# Patient Record
Sex: Female | Born: 1970 | Race: White | Hispanic: No | Marital: Married | Smoking: Never smoker
Health system: Southern US, Community
[De-identification: ages and names within clinical notes are randomized; demographics above are authoritative.]

## PROBLEM LIST (undated history)

## (undated) DIAGNOSIS — B019 Varicella without complication: Secondary | ICD-10-CM

## (undated) DIAGNOSIS — H269 Unspecified cataract: Secondary | ICD-10-CM

## (undated) DIAGNOSIS — F32A Depression, unspecified: Secondary | ICD-10-CM

## (undated) DIAGNOSIS — I499 Cardiac arrhythmia, unspecified: Secondary | ICD-10-CM

## (undated) DIAGNOSIS — D649 Anemia, unspecified: Secondary | ICD-10-CM

## (undated) DIAGNOSIS — I10 Essential (primary) hypertension: Secondary | ICD-10-CM

## (undated) DIAGNOSIS — T4145XA Adverse effect of unspecified anesthetic, initial encounter: Secondary | ICD-10-CM

## (undated) DIAGNOSIS — T8859XA Other complications of anesthesia, initial encounter: Secondary | ICD-10-CM

## (undated) DIAGNOSIS — F319 Bipolar disorder, unspecified: Secondary | ICD-10-CM

## (undated) DIAGNOSIS — R0600 Dyspnea, unspecified: Secondary | ICD-10-CM

## (undated) DIAGNOSIS — E039 Hypothyroidism, unspecified: Secondary | ICD-10-CM

## (undated) DIAGNOSIS — F419 Anxiety disorder, unspecified: Secondary | ICD-10-CM

## (undated) DIAGNOSIS — K219 Gastro-esophageal reflux disease without esophagitis: Secondary | ICD-10-CM

## (undated) DIAGNOSIS — F329 Major depressive disorder, single episode, unspecified: Secondary | ICD-10-CM

## (undated) DIAGNOSIS — T7840XA Allergy, unspecified, initial encounter: Secondary | ICD-10-CM

## (undated) DIAGNOSIS — E785 Hyperlipidemia, unspecified: Secondary | ICD-10-CM

## (undated) DIAGNOSIS — G4730 Sleep apnea, unspecified: Secondary | ICD-10-CM

## (undated) HISTORY — DX: Hyperlipidemia, unspecified: E78.5

## (undated) HISTORY — PX: WISDOM TOOTH EXTRACTION: SHX21

## (undated) HISTORY — PX: CATARACT EXTRACTION, BILATERAL: SHX1313

## (undated) HISTORY — DX: Cardiac arrhythmia, unspecified: I49.9

## (undated) HISTORY — DX: Gastro-esophageal reflux disease without esophagitis: K21.9

## (undated) HISTORY — DX: Bipolar disorder, unspecified: F31.9

## (undated) HISTORY — DX: Anxiety disorder, unspecified: F41.9

## (undated) HISTORY — DX: Unspecified cataract: H26.9

## (undated) HISTORY — DX: Essential (primary) hypertension: I10

## (undated) HISTORY — DX: Anemia, unspecified: D64.9

## (undated) HISTORY — DX: Major depressive disorder, single episode, unspecified: F32.9

## (undated) HISTORY — DX: Sleep apnea, unspecified: G47.30

## (undated) HISTORY — DX: Varicella without complication: B01.9

## (undated) HISTORY — DX: Depression, unspecified: F32.A

## (undated) HISTORY — DX: Allergy, unspecified, initial encounter: T78.40XA

---
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
## 1997-05-26 HISTORY — PX: BACK SURGERY: SHX140

## 1999-05-27 HISTORY — PX: THYROIDECTOMY, PARTIAL: SHX18

## 1999-11-26 ENCOUNTER — Other Ambulatory Visit: Admission: RE | Admit: 1999-11-26 | Discharge: 1999-11-26 | Payer: Self-pay | Admitting: Endocrinology

## 1999-12-04 ENCOUNTER — Encounter: Payer: Self-pay | Admitting: Endocrinology

## 1999-12-04 ENCOUNTER — Ambulatory Visit (HOSPITAL_COMMUNITY): Admission: RE | Admit: 1999-12-04 | Discharge: 1999-12-04 | Payer: Self-pay | Admitting: Endocrinology

## 2000-02-05 ENCOUNTER — Encounter: Payer: Self-pay | Admitting: Surgery

## 2000-02-10 ENCOUNTER — Observation Stay (HOSPITAL_COMMUNITY): Admission: RE | Admit: 2000-02-10 | Discharge: 2000-02-11 | Payer: Self-pay | Admitting: Surgery

## 2000-02-10 ENCOUNTER — Encounter (INDEPENDENT_AMBULATORY_CARE_PROVIDER_SITE_OTHER): Payer: Self-pay | Admitting: Specialist

## 2000-09-10 ENCOUNTER — Encounter: Payer: Self-pay | Admitting: Emergency Medicine

## 2000-09-10 ENCOUNTER — Encounter: Admission: RE | Admit: 2000-09-10 | Discharge: 2000-09-10 | Payer: Self-pay | Admitting: Emergency Medicine

## 2000-12-28 ENCOUNTER — Encounter: Admission: RE | Admit: 2000-12-28 | Discharge: 2001-03-28 | Payer: Self-pay | Admitting: Emergency Medicine

## 2001-05-26 HISTORY — PX: UMBILICAL HERNIA REPAIR: SHX196

## 2002-02-23 ENCOUNTER — Ambulatory Visit (HOSPITAL_BASED_OUTPATIENT_CLINIC_OR_DEPARTMENT_OTHER): Admission: RE | Admit: 2002-02-23 | Discharge: 2002-02-23 | Payer: Self-pay | Admitting: Surgery

## 2002-02-23 ENCOUNTER — Encounter (INDEPENDENT_AMBULATORY_CARE_PROVIDER_SITE_OTHER): Payer: Self-pay | Admitting: Specialist

## 2003-11-20 ENCOUNTER — Other Ambulatory Visit: Admission: RE | Admit: 2003-11-20 | Discharge: 2003-11-20 | Payer: Self-pay | Admitting: Obstetrics and Gynecology

## 2003-12-04 ENCOUNTER — Encounter: Admission: RE | Admit: 2003-12-04 | Discharge: 2003-12-04 | Payer: Self-pay | Admitting: Obstetrics and Gynecology

## 2004-11-21 ENCOUNTER — Encounter: Admission: RE | Admit: 2004-11-21 | Discharge: 2004-11-21 | Payer: Self-pay | Admitting: Emergency Medicine

## 2006-01-19 ENCOUNTER — Encounter: Admission: RE | Admit: 2006-01-19 | Discharge: 2006-01-19 | Payer: Self-pay | Admitting: Emergency Medicine

## 2006-10-15 ENCOUNTER — Ambulatory Visit: Payer: Self-pay | Admitting: Cardiology

## 2006-10-18 ENCOUNTER — Ambulatory Visit (HOSPITAL_BASED_OUTPATIENT_CLINIC_OR_DEPARTMENT_OTHER): Admission: RE | Admit: 2006-10-18 | Discharge: 2006-10-18 | Payer: Self-pay | Admitting: Emergency Medicine

## 2006-10-18 ENCOUNTER — Encounter: Payer: Self-pay | Admitting: Internal Medicine

## 2006-10-19 ENCOUNTER — Ambulatory Visit: Payer: Self-pay | Admitting: Internal Medicine

## 2006-11-02 ENCOUNTER — Ambulatory Visit: Payer: Self-pay

## 2006-11-02 ENCOUNTER — Encounter: Payer: Self-pay | Admitting: Cardiology

## 2006-11-06 ENCOUNTER — Ambulatory Visit: Payer: Self-pay | Admitting: Cardiovascular Disease

## 2006-11-23 ENCOUNTER — Encounter: Payer: Self-pay | Admitting: Internal Medicine

## 2006-11-23 ENCOUNTER — Ambulatory Visit (HOSPITAL_BASED_OUTPATIENT_CLINIC_OR_DEPARTMENT_OTHER): Admission: RE | Admit: 2006-11-23 | Discharge: 2006-11-23 | Payer: Self-pay | Admitting: Emergency Medicine

## 2006-11-29 ENCOUNTER — Ambulatory Visit: Payer: Self-pay | Admitting: Internal Medicine

## 2006-12-24 ENCOUNTER — Ambulatory Visit: Payer: Self-pay | Admitting: Pulmonary Disease

## 2007-01-27 ENCOUNTER — Ambulatory Visit: Payer: Self-pay | Admitting: Pulmonary Disease

## 2007-04-15 ENCOUNTER — Encounter: Admission: RE | Admit: 2007-04-15 | Discharge: 2007-04-15 | Payer: Self-pay | Admitting: Sports Medicine

## 2007-04-23 ENCOUNTER — Telehealth: Payer: Self-pay | Admitting: Pulmonary Disease

## 2007-05-17 ENCOUNTER — Telehealth (INDEPENDENT_AMBULATORY_CARE_PROVIDER_SITE_OTHER): Payer: Self-pay | Admitting: *Deleted

## 2007-05-17 DIAGNOSIS — G4733 Obstructive sleep apnea (adult) (pediatric): Secondary | ICD-10-CM | POA: Insufficient documentation

## 2007-05-25 ENCOUNTER — Encounter: Payer: Self-pay | Admitting: Pulmonary Disease

## 2007-05-25 DIAGNOSIS — F319 Bipolar disorder, unspecified: Secondary | ICD-10-CM | POA: Insufficient documentation

## 2007-05-25 DIAGNOSIS — G43909 Migraine, unspecified, not intractable, without status migrainosus: Secondary | ICD-10-CM | POA: Insufficient documentation

## 2007-05-25 DIAGNOSIS — E782 Mixed hyperlipidemia: Secondary | ICD-10-CM | POA: Insufficient documentation

## 2007-05-25 DIAGNOSIS — I10 Essential (primary) hypertension: Secondary | ICD-10-CM | POA: Insufficient documentation

## 2007-05-25 DIAGNOSIS — J309 Allergic rhinitis, unspecified: Secondary | ICD-10-CM

## 2007-05-27 HISTORY — PX: LAPAROSCOPIC ASSISTED VAGINAL HYSTERECTOMY: SHX5398

## 2007-06-01 ENCOUNTER — Encounter: Admission: RE | Admit: 2007-06-01 | Discharge: 2007-06-01 | Payer: Self-pay | Admitting: Emergency Medicine

## 2007-06-03 ENCOUNTER — Encounter: Admission: RE | Admit: 2007-06-03 | Discharge: 2007-06-03 | Payer: Self-pay | Admitting: Emergency Medicine

## 2007-06-14 ENCOUNTER — Ambulatory Visit (HOSPITAL_COMMUNITY): Admission: RE | Admit: 2007-06-14 | Discharge: 2007-06-14 | Payer: Self-pay | Admitting: Family Medicine

## 2007-06-23 ENCOUNTER — Ambulatory Visit: Payer: Self-pay | Admitting: Pulmonary Disease

## 2007-06-29 ENCOUNTER — Telehealth (INDEPENDENT_AMBULATORY_CARE_PROVIDER_SITE_OTHER): Payer: Self-pay | Admitting: *Deleted

## 2007-07-01 ENCOUNTER — Encounter: Payer: Self-pay | Admitting: Pulmonary Disease

## 2007-07-01 ENCOUNTER — Ambulatory Visit: Payer: Self-pay | Admitting: Pulmonary Disease

## 2007-07-07 ENCOUNTER — Encounter: Payer: Self-pay | Admitting: Pulmonary Disease

## 2007-07-09 ENCOUNTER — Telehealth: Payer: Self-pay | Admitting: Pulmonary Disease

## 2007-07-20 ENCOUNTER — Ambulatory Visit: Payer: Self-pay | Admitting: Pulmonary Disease

## 2007-08-04 ENCOUNTER — Telehealth: Payer: Self-pay | Admitting: Pulmonary Disease

## 2007-08-12 ENCOUNTER — Telehealth (INDEPENDENT_AMBULATORY_CARE_PROVIDER_SITE_OTHER): Payer: Self-pay | Admitting: *Deleted

## 2007-08-18 ENCOUNTER — Ambulatory Visit: Payer: Self-pay | Admitting: Cardiology

## 2007-09-07 ENCOUNTER — Telehealth: Payer: Self-pay | Admitting: Pulmonary Disease

## 2007-09-15 ENCOUNTER — Ambulatory Visit: Payer: Self-pay | Admitting: Cardiology

## 2007-10-04 ENCOUNTER — Ambulatory Visit: Payer: Self-pay | Admitting: Cardiology

## 2008-07-11 ENCOUNTER — Encounter: Admission: RE | Admit: 2008-07-11 | Discharge: 2008-07-11 | Payer: Self-pay | Admitting: Family Medicine

## 2008-08-22 DIAGNOSIS — K219 Gastro-esophageal reflux disease without esophagitis: Secondary | ICD-10-CM | POA: Insufficient documentation

## 2008-08-22 DIAGNOSIS — R0600 Dyspnea, unspecified: Secondary | ICD-10-CM | POA: Insufficient documentation

## 2008-08-22 DIAGNOSIS — I498 Other specified cardiac arrhythmias: Secondary | ICD-10-CM | POA: Insufficient documentation

## 2008-08-22 DIAGNOSIS — G4730 Sleep apnea, unspecified: Secondary | ICD-10-CM | POA: Insufficient documentation

## 2008-09-07 ENCOUNTER — Encounter: Payer: Self-pay | Admitting: Cardiology

## 2008-09-07 ENCOUNTER — Ambulatory Visit: Payer: Self-pay | Admitting: Cardiology

## 2009-02-16 ENCOUNTER — Ambulatory Visit: Payer: Self-pay | Admitting: Pulmonary Disease

## 2009-03-19 ENCOUNTER — Telehealth: Payer: Self-pay | Admitting: Pulmonary Disease

## 2009-03-19 ENCOUNTER — Encounter: Payer: Self-pay | Admitting: Pulmonary Disease

## 2009-07-18 ENCOUNTER — Encounter (INDEPENDENT_AMBULATORY_CARE_PROVIDER_SITE_OTHER): Payer: Self-pay | Admitting: *Deleted

## 2009-09-08 ENCOUNTER — Encounter: Payer: Self-pay | Admitting: Cardiology

## 2009-09-10 ENCOUNTER — Ambulatory Visit: Payer: Self-pay | Admitting: Cardiology

## 2009-11-05 ENCOUNTER — Telehealth: Payer: Self-pay | Admitting: Cardiology

## 2009-11-06 ENCOUNTER — Telehealth: Payer: Self-pay | Admitting: Cardiology

## 2010-06-25 NOTE — Assessment & Plan Note (Signed)
Summary: f1y  Medications Added GABAPENTIN 600 MG TABS (GABAPENTIN) three times a day LIPITOR 40 MG TABS (ATORVASTATIN CALCIUM) Take 1 tablet by mouth once a day -- out of for 2 weeks VOLTAREN 1 % GEL (DICLOFENAC SODIUM) as directed  ( NOT STARTED YET)      Allergies Added: NKDA  Visit Type:  Follow-up Primary Provider:  Nolon Nations, MD  CC:  tachycardia.  History of Present Illness: The patient is seen for followup of tachycardia.  Historically she had sinus tachycardia that responded well to low-dose beta-blockade.  She does have sleep apnea.  There is also question of an asthmatic component.  Earlier this year she did have some shortness of breath but this has improved.  Current Medications (verified): 1)  Gabitril 4 Mg  Tabs (Tiagabine Hcl) .... 2 Two Times A Day 2)  Hydrochlorothiazide 25 Mg  Tabs (Hydrochlorothiazide) .... Take 1 Tablet By Mouth Once A Day 3)  Lisinopril 20 Mg  Tabs (Lisinopril) .... Once Daily 4)  Glucophage 1000 Mg  Tabs (Metformin Hcl) .... Two Times A Day 5)  Amaryl 2mg   Tabs (Glimepiride) .... Take 1 Tablet By Mouth Once A Day 6)  Lamictal 200 Mg  Tabs (Lamotrigine) .... Take 1 Tablet By Mouth Two Times A Day 7)  Zegerid 40-1100 Mg  Caps (Omeprazole-Sodium Bicarbonate) .... Once Daily 8)  Albuterol 90 Mcg/act  Aers (Albuterol) .... Inhale 2 Puffs Every 4 To 6 Hours As Needed 9)  Qvar 80 Mcg/act  Aers (Beclomethasone Dipropionate) .... Inhale 2 Puffs Two Times A Day 10)  Tegretol 200 Mg  Tabs (Carbamazepine) .... Take 1 Tab By Mouth Each Morning and 2 Tabs By Mouth At Bedtime 11)  Lantus Solostar 100 Unit/ml  Soln (Insulin Glargine) .... Use As Directed 12)  Gabapentin 600 Mg Tabs (Gabapentin) .... Three Times A Day 13)  Estradiol 1.5 Mg Tabs (Estradiol) .[name].. 1 Once Daily 14)  Cinnamon 500 Mg Caps (Cinnamon) .[name].. 1 Two Times A Day 15)  Centrum  Tabs (Multiple Vitamins-Minerals) .... Take 1 Tablet By Mouth Once A Day 16)  Caltrate 600+d 600-400 Mg-Unit  Tabs (Calcium Carbonate-Vitamin D) .... Take 1 Tablet By Mouth Two Times A Day 17)  Vitamin D 2000 Unit Tabs (Cholecalciferol) .... Take 1 Tablet By Mouth Once A Day 18)  Aspirin 81 Mg Tabs (Aspirin) .... Take 2 Tabs By Mouth At Bedtime 19)  Onglyza 5 Mg Tabs (Saxagliptin Hcl) .... Take By Mouth At Lunch 20)  Coreg Cr 40 Mg Xr24h-Cap (Carvedilol Phosphate) .... Take 1 Tablet By Mouth Once A Day 21)  Lipitor 40 Mg Tabs (Atorvastatin Calcium) .... Take 1 Tablet By Mouth Once A Day -- Out of For 2 Weeks 22)  Voltaren 1 % Gel (Diclofenac Sodium) .... As Directed  ( Not Started Yet)  Allergies (verified): No Known Drug Allergies  Past History:  Past Medical History: Last updated: 09/08/2009  GERD (ICD-530.81) SINUS TACHYCARDIA (ICD-427.89)..persistent. Treated with beta blockers EF  60%...echo.Marland KitchenMarland Kitchen6/2008 SHORTNESS OF BREATH (ICD-786.05) DEPRESSION (ICD-311) OBESITY, MORBID (ICD-278.01) ALLERGIC RHINITIS (ICD-477.9) MIGRAINE, CHRONIC (ICD-346.90) HYPERLIPIDEMIA, MIXED (ICD-272.2) IDDM (ICD-250.01) HYPERTENSION (ICD-401.9) OBSTRUCTIVE SLEEP APNEA (ICD-327.23) Diabetes  Review of Systems       Patient denies fever, chills, headache, sweats, rash, change in vision, change in hearing, chest pain, cough, nausea vomiting, urinary symptoms.  All the systems are reviewed and are negative  Vital Signs:  Patient profile:   40 year old female Height:      69 inches Weight:  364 pounds BMI:     53.95 Pulse rate:   83 / minute BP sitting:   112 / 80  (left arm) Cuff size:   large  Vitals Entered By: Hardin Negus, RMA (September 10, 2009 3:02 PM)  Physical Exam  General:  patient is stable.  She is significantly overweight. Eyes:  no xanthelasma. Neck:  no jugular venous distention. Lungs:  lungs are clear.  Respiratory effort is nonlabored. Heart:  cardiac exam reveals S1 and S2.  No clicks or significant murmurs. Abdomen:  abdomen is soft but obese. Extremities:  no peripheral  edema. Psych:  patient is oriented to person time and place.  Affect is normal.   Impression & Recommendations:  Problem # 1:  SINUS TACHYCARDIA (ICD-427.89)  Her updated medication list for this problem includes:    Lisinopril 20 Mg Tabs (Lisinopril) ..... Once daily    Aspirin 81 Mg Tabs (Aspirin) .[name]... Take 2 tabs by mouth at bedtime    Coreg Cr 40 Mg Xr24h-cap (Carvedilol phosphate) .[name]... Take 1 tablet by mouth once a day  Orders: EKG w/ Interpretation (93000) Historically the patient has had some sinus tachycardia.  Her heart rate is well-controlled with carvedilol.  EKG is done today and reviewed by me.  She has normal sinus rhythm with a normal EKG. No further workup is needed.  Problem # 2:  SHORTNESS OF BREATH (ICD-786.05)  Her updated medication list for this problem includes:    Hydrochlorothiazide 25 Mg Tabs (Hydrochlorothiazide) .[name]... Take 1 tablet by mouth once a day    Lisinopril 20 Mg Tabs (Lisinopril) ..... Once daily    Aspirin 81 Mg Tabs (Aspirin) .[name]... Take 2 tabs by mouth at bedtime    Coreg Cr 40 Mg Xr24h-cap (Carvedilol phosphate) .[name]... Take 1 tablet by mouth once a day The patient currently is not having any shortness of breath.  No further workup.  We know that she has good LV function by echo in the past.  I've chosen not to repeat an echo at this time.  Problem # 3:  OBESITY, MORBID (ICD-278.01) Weight loss certainly would help.  Patient Instructions: 1)  Follow up in 1 year

## 2010-06-25 NOTE — Letter (Signed)
Summary: Appointment - Reminder 2  [facility], Main Office  [address]   [place], [place] [address]   Phone: [phone]  Fax: [phone]     July 18, 2009 MRN: [id number]   [facility] [address] [place], [place]  [address]   Dear Ms. Reifschneider,  Our records indicate that it is time to schedule a follow-up appointment with Dr. Myrtis Ser. It is very important that we reach you to schedule this appointment. We look forward to participating in your health care needs. Please contact us at the number listed above at your earliest convenience to schedule your appointment.  If you are unable to make an appointment at this time, give [place] a call so we can update our records.     Sincerely,   Migdalia Dk [facility] Scheduling Team

## 2010-06-25 NOTE — Progress Notes (Signed)
Summary: medication question   Phone Note Call from Patient Call back at Work Phone [phone]   Caller: Patient Summary of Call: medication question Initial call taken by: Judie Grieve,  November 05, 2009 1:40 PM  Follow-up for Phone Call        11/05/09--1555--dr Myrtis Ser or heather--received a call from [name], who states she is no longer going to see  a dr Ivory Broad and has switched to dr Barton Fanny at [place] as PCP--dr rankin would like to know if dr Myrtis Ser would consider decreasing coreg dose as dr Luciana Axe feels this is rather high, also could she switch to a short acting tablet as her insurance will not pay for extended release--pt will be out of coreg on wednesday 6/15--so could we call in coreg to [facility]--advised heather not here until wednesday, but would forward message--nt Follow-up by: Ledon Snare, RN,  November 05, 2009 4:03 PM     Appended Document: medication question See the other phone note

## 2010-06-25 NOTE — Miscellaneous (Signed)
  Clinical Lists Changes  Observations: Added new observation of PAST MED HX:  GERD (ICD-530.81) SINUS TACHYCARDIA (ICD-427.89)..persistent. Treated with beta blockers EF  60%...echo.Marland KitchenMarland Kitchen6/2008 SHORTNESS OF BREATH (ICD-786.05) DEPRESSION (ICD-311) OBESITY, MORBID (ICD-278.01) ALLERGIC RHINITIS (ICD-477.9) MIGRAINE, CHRONIC (ICD-346.90) HYPERLIPIDEMIA, MIXED (ICD-272.2) IDDM (ICD-250.01) HYPERTENSION (ICD-401.9) OBSTRUCTIVE SLEEP APNEA (ICD-327.23) Diabetes  (09/08/2009 16:05) Added new observation of PRIMARY MD: Binnie Rail (09/08/2009 16:05)       Past History:  Past Medical History:  GERD (ICD-530.81) SINUS TACHYCARDIA (ICD-427.89)..persistent. Treated with beta blockers EF  60%...echo.Marland KitchenMarland Kitchen6/2008 SHORTNESS OF BREATH (ICD-786.05) DEPRESSION (ICD-311) OBESITY, MORBID (ICD-278.01) ALLERGIC RHINITIS (ICD-477.9) MIGRAINE, CHRONIC (ICD-346.90) HYPERLIPIDEMIA, MIXED (ICD-272.2) IDDM (ICD-250.01) HYPERTENSION (ICD-401.9) OBSTRUCTIVE SLEEP APNEA (ICD-327.23) Diabetes

## 2010-06-25 NOTE — Progress Notes (Signed)
Summary: pt has questions  Medications Added CARVEDILOL 25 MG TABS (CARVEDILOL) Take one tablet by mouth twice a day       Phone Note Call from Patient Call back at Work Phone [phone]   Caller: Patient Reason for Call: Talk to Nurse, Talk to Doctor Summary of Call: pt insurance is not covering the extended release of coreg and she needs to talk to someone about it. [facility] pcp wants to know if they want to reduce the dosage since 40mg  is kinda high to start on Initial call taken by: Omer Jack,  November 06, 2009 1:06 PM  Follow-up for Phone Call        Phone Call Completed PT AWARE WILL FORWARD TO DR Myrtis Ser FOR REVIEW  AWAITNG ANSWER FROM DR Myrtis Ser. INFORMED PT WILL RETURN CALL ONCE RESPONSE IS GIVEN. VERBALZIED UNDERSTANIDNG. Follow-up by: Scherrie Bateman, LPN,  November 06, 2009 1:19 PM  Additional Follow-up for Phone Call Additional follow up Details #1::        OK to use Carvedilol 25 two times a day or metoprolol  succinate 100 mg daily Talitha Givens, MD, [facility]  November 07, 2009 1:01 PM  pt aware new rx for carvedilol 25mg  two times a day [facility] Meredith Staggers, RN  November 08, 2009 10:12 AM     New/Updated Medications: CARVEDILOL 25 MG TABS (CARVEDILOL) Take one tablet by mouth twice a day Prescriptions: CARVEDILOL 25 MG TABS (CARVEDILOL) Take one tablet by mouth twice a day  #60 x 12   Entered by:   Meredith Staggers, RN   Authorized by:   Talitha Givens, MD, [facility]   Signed by:   Meredith Staggers, RN on 11/08/2009   Method used:   Electronically to        [facility] [address] [id number] (retail)       [address]       [place], [place]  [address]       Ph: [phone]       Fax: [phone]   RxID:   [id number]

## 2010-10-08 NOTE — Assessment & Plan Note (Signed)
[facility]                            CARDIOLOGY OFFICE NOTE   NAME:[name], Crystal DUMIRE                        MRN:          [id number]  DATE:08/18/2007                            DOB:          21-Mar-1971    [name] is here for cardiology evaluation.  I had seen her last in May  2008.  She had some shortness of breath at that time.  Ultimately she  saw Dr. Shelle Iron.  CPAP was recommended and was titrated up.  She then had  some type of pulmonary illness in January 2009 and she has been off her  CPAP for a while.  She tells me that it was not thought to be asthma but  that inhalers may help it.  Despite this, she has had increasing doses  of metoprolol and she has tolerated them well, and in fact this has  helped her.  Now that she is eating better, her blood pressure is under  better control.  She has worn a Holter at some time in the past but I do  not have records.  She notes that when she exercise in the gym, her  heart rate increases significantly.  Also she has increased heart rate  when being at home in the afternoon with a rate in the range of 90-100  at rest.  Considering all these issues she says that the higher dose of  metoprolol has definitely helped.   PAST MEDICAL HISTORY:   ALLERGIES:  No known drug allergies.   MEDICATIONS:  1. Gabitril 8 mg b.i.d.  2. Hydrochlorothiazide 25.  3. Lisinopril 20.  4. Glucophage 1000 b.i.d.  5. Zegerid.  6. Glimepiride.  7. Metoprolol 50 t.i.d. (to be increased to 100 mg b.i.d.)  8. Lamictal.  9. Tegretol inhaler.  10.Allegra nasal spray.  11.Lantus insulin.   OTHER MEDICAL PROBLEMS:  See the list below.   REVIEW OF SYSTEMS:  Other than the HPI, review of systems is negative.   PHYSICAL EXAM:  Blood pressure today is 116/78 with a pulse of 64.  Her  weight is 342 pounds.  This is in fact coming down somewhat and she is  trying to lose weight.  The patient is oriented to person, time and place.   Affect is normal.  HEENT:  Reveals no xanthelasma.  She has normal extraocular motion.  There are no carotid bruits.  There is no jugular venous distention.  Lungs are clear.  No wheezing is heard.  Respiratory effort is not  labored.  Cardiac exam reveals S1-S2.  There are no clicks or significant murmurs.  Her abdomen is obese but soft.  She has no significant peripheral edema.   EKG today reveals sinus rhythm.   PROBLEMS:  1. Persistent sinus tachycardia with normal thyroid functions.  We      have seen some of this in the past.  I believe that she will      respond further to higher doses of beta blockade as long as this      does not affect her lungs.  2. Significant  sleep apnea for which she needs to see have and she      will be returning to Dr. Shelle Iron.  3. Good left ventricular function.  4. Hypertension treated.  5. Diabetes treated.  6. Hyperlipidemia treated.  7. Gastroesophageal reflux disease.  8. Morbid obesity.  She is losing some weight and she needs to      continue doing this.   I believe that some of her shortness of breath may still be related to  increased heart rate.  Hopefully, her lungs will tolerate higher doses  of metoprolol.  We will push her up to 100 mg b.i.d.     Luis Abed, MD, [facility]  Electronically Signed    JDK/MedQ  DD: 08/18/2007  DT: 08/18/2007  Job #: [id number]   cc:   Reuben Likes, M.D.  Barbaraann Share, MD,FCCP

## 2010-10-08 NOTE — Procedures (Signed)
[name], [name]                 ACCOUNT NO.:  [id number]   MEDICAL RECORD NO.:  [id number]          PATIENT TYPE:  OUT   LOCATION:  SLEEP CENTER                 FACILITY[facility]  [facility]   PHYSICIAN:  Clinton D. Maple Hudson, MD, FCCP, FACPDATE OF BIRTH:  10-23-71   DATE OF STUDY:  11/23/2006                            NOCTURNAL POLYSOMNOGRAM   REFERRING PHYSICIAN:  Reuben Likes, M.D.   INDICATION FOR STUDY:  Hypersomnia with sleep apnea.   EPWORTH SLEEPINESS SCORE:  20/24, BMI 50.8, weight 356 pounds.   MEDICATIONS:  Home medications are listed and reviewed.   A diagnostic NPSG on Oct 18, 2006, recorded an AHI of 23 per hour.  CPAP  titration is requested.   SLEEP ARCHITECTURE:  Total sleep time 275 minutes with sleep efficiency  77%.  Stage I was 15%, stage II 70%, stages III and IV 2%, REM 13% of  total sleep time.  Sleep latency 11 minutes, REM latency 188 minutes,  awake after sleep onset 64 minutes, arousal index 2.8.  Bedtime  medication included Lantus, Depakote, lamotrigine and temazepam.   RESPIRATORY DATA:  CPAP titration protocol.  CPAP was titrated to 11  CWP, AHI 0 per hour.  An extra small Mirage Quattro mask was used with  heated humidifier.   OXYGEN DATA:  Snoring was prevented by CPAP but saturation held at 94%  on room air.   CARDIAC DATA:  Normal sinus rhythm with rate PVC.   MOVEMENT-PARASOMNIA:  No significant movement disturbance.  Bathroom x1.  The patient brought a small fan used for white noise.   IMPRESSIONS-RECOMMENDATIONS:  1. Short total sleep time despite sedating medications taken at      bedtime.  2. CPAP titration to 11 centimeters of water pressure, apnea-hypopnea      index 0 per hour.  An extra small Mirage Quattro mask was used with      heated humidifier.  3. Diagnostic nocturnal polysomnogram on Oct 18, 2006, had recorded an      apnea-hypopnea index of 23 per hour.      Clinton D. Maple Hudson, MD, FCCP, FACP  Diplomate, Biomedical engineer of  Sleep Medicine  Electronically Signed     CDY/MEDQ  D:  11/29/2006 14:12:49  T:  11/29/2006 16:17:40  Job:  [id number]

## 2010-10-08 NOTE — Assessment & Plan Note (Signed)
[facility]                            CARDIOLOGY OFFICE NOTE   NAME:Molzahn, CORLEEN OTWELL                        MRN:          [id number]  DATE:10/04/2007                            DOB:          30-Apr-1971    Ms. Pryer is seen for follow-up.  See my note of September 15, 2007.  We  have been varying the dosing of her metoprolol in terms of doses and  times, and I believe now we are stable.  She will take a 100 in the  morning and a second 100 at suppertime.  This controls her feeling of  tachycardia and her blood pressure is stable.  I will not make any other  changes at this time.   PAST MEDICAL HISTORY:   ALLERGIES:  NO KNOWN DRUG ALLERGIES.   MEDICATIONS:  See the note of September 15, 2007, with no significant  changes.   OTHER MEDICAL PROBLEMS:  See my note of August 18, 2007.   REVIEW OF SYSTEMS:  She is feeling well and doing well.   PHYSICAL EXAMINATION:  VITAL SIGNS:  Weight is 337 pounds, which is  stable for her.  Blood pressure is 121/73 with a pulse of 72.  GENERAL:  The patient is oriented to person, time and place.  Affect is  normal.  She is significantly overweight.  HEENT:  Reveals no xanthelasma.  She has normal extraocular motion.  NECK:  There are no carotid bruits.  There is no jugulovenous  distention.  LUNGS:  Clear.  Respiratory effort is not labored.  CARDIAC:  Reveals S1-S2.  There are no clicks or significant murmurs.  ABDOMEN:  Obese, but soft.  EXTREMITIES:  She has no peripheral edema.   Problems are listed completely on the note of August 18, 2007.   PROBLEM:  Persistent sinus tachycardia.  This is more stable at this  time.  No change in her meds.  I will see her back in 6 months to  rereview and then possibly once a year.     Luis Abed, MD, [facility]  Electronically Signed    JDK/MedQ  DD: 10/04/2007  DT: 10/04/2007  Job #: [id number]   cc:   Reuben Likes, M.D.

## 2010-10-08 NOTE — Assessment & Plan Note (Signed)
[facility]                             PULMONARY OFFICE NOTE   NAME:[name], [name]                        MRN:          [id number]  DATE:12/24/2006                            DOB:          03/10/1971    HISTORY OF PRESENT ILLNESS:  The patient is a 40 year old female whom I  have been asked to see for obstructive sleep apnea.  The patient  underwent nocturnal polysomnography in May 2008, and had an apnea  hypopnea index of 23 events per hour.  She returned to sleep lab in June  2008 where she had a very short total sleep time, and was titrated to a  final pressure of 11 cm with what appeared to be good control of her  obstructive events.  However, she had very little slow wave sleep, and  REM.  It was unclear whether that would be adequate pressure once she  had a longer total sleep time, and deeper levels of sleep.  Patient  states that she typically goes to between 10 and 12 at night, and gets  up at 8:30 in the morning to start her day.  She is exhausted whenever  she wakes up.  She awakens at least 6 to 7 times a night.  She has been  noted to have loud snoring, but no one has ever mentioned pauses in her  breathing during sleep.  She denies arousals.  Patient works in an  office, and has significant sleep pressure during the day with  occasional dozing.  She will also doze with TV and movies, and does not  some sleep pressure with driving.   PAST MEDICAL HISTORY:  Significant for:  1. Hypertension.  2. History of diabetes.  3. History of dyslipidemia.  4. History of chronic migraines.  5. History of allergic rhinitis.  6. History of spine surgery.  7. History of partial thyroidectomy with recent TSH normal according      to the patient.   CURRENT MEDICATIONS:  Include:  1. Gabitril 4 mg 2 b.i.d.  2. Hydrochlorothiazide of known dose daily.  3. Lisinopril 20 mg daily.  4. Toprol 50 mg daily.  5. Glucophage 1000 b.i.d.  6. Amaryl 8 mg  daily.  7. Depakote 5000 mg daily.  8. Wellbutrin 450 daily.  9. Lamictal 150 daily.  10.Insulin in varying doses.  11.Zegerid 40 mg daily.  12.Valium p.r.n.   PATIENT HAS NO KNOWN DRUG ALLERGIES.   SOCIAL HISTORY:  She has never smoked.  She is married and has children.   FAMILY HISTORY:  Remarkable for mother having asthma and allergies,  otherwise noncontributory in 1st degree relatives.   REVIEW OF SYSTEMS:  As per history of present illness.  Also, see  patient intake form documented in the chart.   PHYSICAL EXAMINATION:  GENERAL:  She is a morbidly obese female in no  acute distress.  Blood pressure is 128/86.  Pulse 86.  Temperature is 98.4.  Weight is  264 pounds.  Her O2 saturation on room air is 96%.  HEENT:  Pupils equal, round, and reactive  to light and accommodation.  Extraocular muscles are intact.  Nares shows mild septal deviation to  the left.  Oropharynx with small opening, and significant tissue  redundancy.  There is mild elongation of soft palate and uvula.  NECK:  Large and difficult to assess for JVD.  There is no obvious  thyromegaly or lymphadenopathy.  CHEST:  Totally clear.  CARDIAC EXAM:  Reveals regular rate and rhythm.  No murmurs, rubs, or  gallops.  ABDOMEN:  Soft and nontender with good bowel sounds.  GENITAL EXAM:  Not done and not indicated.  RECTAL EXAM:  Not done and not indicated.  BREAST EXAM:  Not done and not indicated.  LOWER EXTREMITIES:  With trace edema.  Pulses are intact distally.  NEUROLOGIC:  Alert and oriented with no obvious observable motor  defects.   IMPRESSION:  Moderate obstructive sleep apnea documented by nocturnal  polysomnography.  The patient has had a recent titration to a final  pressure of 11 cm, however, she really did not achieve deep sleep, and  it is unclear whether it is a truly therapeutic value for her.  I have  had a long discussion with her about the effects of sleep apnea,  including the short term  quality of life issues and the longterm  cardiovascular issues.  Given her various medical problems, I think it  is essential that we control this, and also help her in terms of her  quality of life.   PLAN:  1. We will initiate CPAP starting at 10 cm.  If she tolerates this      well, I think we should probably do a home auto titration to try      and verify whether the 11 cm is really a good pressure for her.  2. Work on weight loss.  3. The patient will follow up in 4 weeks, sooner if there are      problems.     Barbaraann Share, MD,FCCP  Electronically Signed    [facility]/MedQ  DD: 01/27/2007  DT: 01/27/2007  Job #: [id number]   cc:   Reuben Likes, M.D.

## 2010-10-08 NOTE — Assessment & Plan Note (Signed)
[facility]                             PULMONARY OFFICE NOTE   NAME:[name], Crystal ROUTSON                        MRN:          [id number]  DATE:01/27/2007                            DOB:          15-Jul-1970    SUBJECTIVE:  Ms. Franko comes in today after being started on CPAP at the  last visit.  She has been using this every night and has definitely seen  a difference in terms of her sleep efficiency, decreased number of  awakenings and increased daytime alertness.  She still feels fatigued at  times in the afternoon but I have reminded her that I do not think we  have totally optimized her pressure.  She is having a little bit of  difficulty with the mask slipping but she is working on the interface  between her and the mask with regards to oils on her face and keeping  the mask cleaner.  Overall she is quite pleased with the first 4 weeks  of therapy.   PHYSICAL EXAMINATION:  GENERAL:  She is a morbidly obese female, in no  acute distress.  Blood pressure is 138/86, pulse is 81, temperature is  98.1, weight is 368 pounds, O2 saturation room air is 95%.  There is no  evidence of skin breakdown or partial necrosis from the CPAP mask.   IMPRESSION:  Moderate obstructive sleep apnea which has responded quite  well to continuous positive airway pressure therapy.  Patient has  tolerated the pressure without difficulty and is only having minimal  mask issues.  At this point in time I think we need to optimize the  pressure for her with an auto-titrate study at home.  Perhaps she will  have deeper and more consistent sleep that will give [place] a better idea as  to her true pressure needs.   PLAN:  1. We will get her an auto-titrate device for the next 2 weeks to use      in the place of her own machine and will adjust her pressure      according to the download.  2. Work on weight loss.  3. The patient will continue to work with her mask but will let me  know if she continues to have issues with leaking.  4. If the patient is doing well after pressure optimization I will see      her in 6 months or sooner if she is having problems.     Barbaraann Share, MD,FCCP  Electronically Signed    KMC/MedQ  DD: 01/27/2007  DT: 01/27/2007  Job #: [id number]   cc:   Reuben Likes, M.D.

## 2010-10-08 NOTE — Procedures (Signed)
[name], [name]                 ACCOUNT NO.:  [id number]   MEDICAL RECORD NO.:  [id number]         PATIENT TYPE:  OUT   LOCATION:  SLEEP CENTER                 FACILITY:  [facility]   PHYSICIAN:  Clinton D. Maple Hudson, MD, FCCP, FACPDATE OF BIRTH:   DATE OF STUDY:  10/18/2006                            NOCTURNAL POLYSOMNOGRAM   REFERRING PHYSICIAN:  Reuben Likes, M.D.   INDICATION FOR STUDY:  Hypersomnia with sleep apnea.   EPWORTH SLEEPINESS SCORE:  20/24   BMI 50.8, weight 356 pounds   MEDICATIONS:  Home medications listed and reviewed.   SLEEP ARCHITECTURE:  Short total sleep time 120 minutes with sleep  efficiency 32%.  Stage 1 was 13%, stage 2 was 83%, stages 3 and 4  absent.  REM 5% of total sleep time.  Sleep latency 45 minutes.  REM  latency 79 minutes, awake after sleep onset 12.5 minutes.  Patient woke  at about 1:45 a.m. and was unable to regain sleep.  REM AHI 0.  Bedtime  medication included 10 tablets of 500 mg Depakote ER, one tablet of  Quasense, six tablets of lamotrigine 25 mg, injection of Lantus.   RESPIRATORY DATA:  Split study protocol.  Apnea hypopnea index (AHI,  RDI) 23 obstructive events per hour, indicating moderate obstructive  sleep apnea, hypopnea syndrome.  All events before CPAP were hypopneas,  totaling 46.  Most events occurred while supine.  REM AHI 0.   CPAP titration was attempted by split protocol.  The patient woke for  placement of CPAP, but never returned to sleep.  She indicated that she  was comfortable with the mask and would definitely want to try it.  An  extra-small Quattro full face mask and small Comfort Gel mask were  apparently both acceptable.   OXYGEN DATA:  Moderate to loud snoring with oxygen desaturation to a  nadir of 87%.  Mean oxygen saturation through the study was 92% before  CPAP was attempted.   CARDIAC DATA:  Normal sinus rhythm.   MOVEMENT-PARASOMNIA:  Occasional limb jerk, insignificant.   IMPRESSIONS-RECOMMENDATIONS:  1. Moderate obstructive sleep apnea/hypopnea syndrome, AHI 23 per      hour.  All events were hypopneas during this interval and most were      associated with supine sleep position.  Moderate snoring with      oxygen desaturation to a nadir of 87%.  2. She met criteria for split protocol CPAP titration, but was unable      to regain sleep after CPAP mask was placed.  She indicated it was      comfortable and was willing to try.  Technician suggests return      with sleep medication if      CPAP titration is still considered appropriate.  3. Note large number of tablets of medication technician indicates      were taken at bedtime.      Clinton D. Maple Hudson, MD, [facility], FACP  Diplomate, Biomedical engineer of Sleep Medicine  Electronically Signed     CDY/MEDQ  D:  10/19/2006 10:22:39  T:  10/19/2006 13:55:22  Job:  [id number]

## 2010-10-08 NOTE — Assessment & Plan Note (Signed)
[name]                            CARDIOLOGY OFFICE NOTE   NAME:[name]                        MRN:          [id number]  DATE:11/06/2006                            DOB:          [birth year]    REFERRING PHYSICIAN:  Reuben Likes, M.D.   HISTORY OF PRESENT ILLNESS:  [name] is a 40 year old female patient  who returns to the office today for followup on shortness of breath.  When Dr. Myrtis Ser saw her last, he put her on 3 days of Lasix, asked her to  decrease her salt and fluid intake, and set her up for an  echocardiogram.  Her EF was 60% without significant valvular  abnormalities.  She returns today for followup.  She notes her breathing  is better.  She still notes some shortness of breath from time to time  with exertion.  She really notes it more when she is sitting still at  rest.  Denies orthopnea or paroxysmal nocturnal dyspnea.  Denies any  syncope.  Denies any chest pain.   CURRENT MEDICATIONS:  1. Gabitril 4 mg 2 tablets b.i.d.  2. Hydrochlorothiazide daily.  3. Lisinopril 20 mg daily.  4. Toprol 50 mg daily.  5. Glucophage 1 gm b.i.d.  6. Amaryl 8 mg a day.  7. Depakote 5000 mg daily.  8. Wellbutrin 300 mg every other day alternating with 450 mg.  9. Lamictal 150 mg a day.  10.Insulin as directed.  11.Valium p.r.n.   ALLERGIES:  NO KNOWN DRUG ALLERGIES.   PHYSICAL EXAMINATION:  She is a well-nourished, well-developed female in  no acute distress.  Blood pressure 130/86.  Pulse 89.  Weight 356 pounds.  HEENT:  Normal.  NECK:  Without JVD.  CARDIAC:  Normal S1 and S2.  Regular rate and rhythm.  LUNGS:  Clear to auscultation bilaterally without wheezing, rhonchi, or  rales.  ABDOMEN:  Soft and non-tender.  EXTREMITIES:  Trace to 1+ edema bilaterally.  Electrocardiogram reveals sinus rhythm with a heart rate of 87.  No  acute changes.   IMPRESSION:  1. Dyspnea.      a.     Probably multifactorial related to  diagnosis of sleep apnea       of morbid obesity.  2. Good left ventricular function with ejection fraction 60%.  3. Hypertension.  4. Diabetes.  5. Hyperlipidemia.  6. Reflux esophagitis.  7. Morbid obesity.   PLAN:  As noted above, the patient's dyspnea is improved.  I had a talk  with her about weight loss and continuing to decrease her fluid and salt  intake.  Dr. Myrtis Ser also saw the patient today.  She can continue followup  with Dr. Lorenz Coaster, and follow up with [place] as needed.      Tereso Newcomer, PA-C  Electronically Signed      Luis Abed, MD, [facility]  Electronically Signed   SW/MedQ  DD: 11/06/2006  DT: 11/07/2006  Job #: [id number]   cc:   Reuben Likes, M.D.

## 2010-10-08 NOTE — Assessment & Plan Note (Signed)
[facility]                            CARDIOLOGY OFFICE NOTE   NAME:Enneking, ZEYNEP FANTROY                        MRN:          [id number]  DATE:09/15/2007                            DOB:          1971/05/09    HISTORY OF PRESENT ILLNESS:  Ms. Wilz is here for follow-up.  I saw her  last on August 18, 2007.  We increased her beta blocker dose to a total  of 200 mg in a day, but changed her to b.i.d. dosing.  We plan to give  her a dose in the morning and the afternoon dose earlier at 3:00 p.m.  and at suppertime.  She is getting good control during the day.  However, she notices some increased pounding sensation of her heart in  the morning.  The rate is not necessarily fast.  The patient also has  had a cough.  She thinks it may be somewhat worse.   PAST MEDICAL HISTORY:   ALLERGIES:  NO KNOWN DRUG ALLERGIES.   MEDICATIONS:  Gabitril, Hydrochlorothiazide, lisinopril, Glucophage,  albuterol inhaler, Zegerid, Lamictal, Tegretol inhaler, fluticasone  nasal spray, Lantus, metoprolol 100 b.i.d. and Zyrtec.   OTHER MEDICAL PROBLEMS:  See the list on my note of August 18, 2007.   REVIEW OF SYSTEMS:  See the HPI.   PHYSICAL EXAMINATION:  VITAL SIGNS:  Weight is 338 pounds.  This is  decreasing from 342 pounds.  Blood pressure is 124/84 with pulse of 62.  GENERAL:  The patient is oriented to person, time and place.  Affect is  normal.  HEENT:  Reveals no xanthelasma.  She has normal extraocular motion.  NECK:  There are no carotid bruits.  There is no jugular venous  distention.  LUNGS:  Clear.  Respiratory effort is not labored.  CARDIAC:  Exam reveals S1-S2.  There are no clicks or significant  murmurs.  ABDOMEN:  Soft.  She has no peripheral edema.   PROBLEMS:  Listed on my note of August 18, 2007.  1. Persistent sinus tachycardia.  We have her on high-dose metoprolol.      This is probably not optimal in view of her lungs or her diabetes.      We can  continue to see if we can find a dose titration that works      for her and keep other issues in mind.  Will change her dose back      to 100 in the morning and her second 100 at suppertime.  This may      help with how she feels in the morning.   See the problem list of August 18, 2007, for the other problems.  I will  see her back 3 weeks.     Luis Abed, MD, [facility]  Electronically Signed    JDK/MedQ  DD: 09/15/2007  DT: 09/15/2007  Job #: [id number]   cc:   Reuben Likes, M.D.  Barbaraann Share, MD,FCCP

## 2010-10-08 NOTE — Assessment & Plan Note (Signed)
[facility]                            CARDIOLOGY OFFICE NOTE   NAME:[name], [name]                        MRN:          [id number]  DATE:10/15/2006                            DOB:          February 13, 1971    CARDIOLOGY CONSULTATION:  [name] is seen for cardiology follow up.  I  had actually seen her in consultation in 2003.  She had an echo that was  technically difficult, but she had normal left ventricular function.  She had some palpitations in the past.  She seemed stable.  More  recently, she has persistent shortness of breath.  Today, she tells me  that it has become progressively worse.  She has 2 types of shortness of  breath.  One occurs with exercise.  The other shortness of breath occurs  when she is sitting still, and this is more bothersome.  She is not  having any definite PND or orthopnea.  She has had some pedal edema.  The patient does not watch her salt intake.  She drinks a large amount  of extra water, feeling that this might help her, and this may be  playing a role.  She does have diabetes and hypertension, and she is  markedly overweight.   PAST MEDICAL HISTORY:   ALLERGIES:  No known drug allergies.   MEDICATIONS:  1. Gabitril 8 mg b.i.d.  2. Hydrochlorothiazide.  3. Lisinopril 20.  4. Toprol 50.  5. Glucophage 100 b.i.d.  6. Amaryl 8.  7. Depakote 5 gm.  8. Wellbutrin 300 every other day alternating with 450.  9. Lamictal 150.  10.Insulin as directed.   OTHER MEDICAL PROBLEMS:  See the list below.   REVIEW OF SYSTEMS:  As of today, the majority of her symptoms are  related to her shortness of breath.  Otherwise, her review of systems is  negative.   PHYSICAL EXAMINATION:  VITAL SIGNS:  The patient's weight today is 356  pounds.  Blood pressure is 150/90 with a pulse of 108.  GENERAL:  The patient is oriented to person, time and place.  Affect is  normal.  She has no xanthelasma.  HEENT:  There is normal  extraocular motion.  She has normal  conjunctivae.  There are no carotid bruits.  There is no jugular venous  distention.  LUNGS:  Clear.  Respiratory effort is not labored.  CARDIAC:  S1 with an S2.  There are no clicks or significant murmurs.  ABDOMEN:  Obese.  EXTREMITIES:  She does have 1+ peripheral edema.  MUSCULOSKELETAL:  There are no major musculoskeletal deformities.   ELECTROCARDIOGRAM:  EKG reveals sinus tachycardia.   LABORATORY DATA:  Labs sent from Dr. Lorenz Coaster reveal that her hemoglobin  was 13.  I cannot read all of the faxed copy.  Her TSH was normal.  BUN  was 15 and creatinine 0.7.   PROBLEMS:  1. Persistent sinus tachycardia with normal thyroid functions.  2. Hypertension.  3. Diabetes.  4. Hyperlipidemia.  5. Reflux esophagitis.  6. Possible sleep apnea.  7. Shortness of breath.  The shortness of  breath appears to be her      major problem.  8. Some volume overload.   I have asked her to watch her salt intake.  I have asked her the  drastically cut down the amount of water that she is drinking.  She will  receive 3 or 4 days of Lasix and then she will return to her  hydrochlorothiazide.  We will obtain a chest x-ray.  She also needs a  follow up echo to reassess left ventricular function.  I will then see  her for follow up.     Luis Abed, MD, [facility]  Electronically Signed    JDK/MedQ  DD: 10/15/2006  DT: 10/15/2006  Job #: [id number]   cc:   Reuben Likes, M.D.

## 2010-10-11 NOTE — Op Note (Signed)
[facility]  Patient:    [name], [name]                          MRN: [id number] Proc. Date: 02/10/00 Adm. Date:  [id number] Attending:  Charlton Haws CC:         Reuben Likes, M.D.  Reather Littler, M.D.   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  OPERATION PERFORMED:  Left thyroid lobectomy and ______.  SURGEON:  Dr. Jamey Ripa.  ASSISTANT:  Dr. Samuella Cota.  ANESTHESIA:  General endotracheal.  CLINICAL HISTORY:  This patient is a 40 year old with an approximately 2.5 cm nodule of the left lobe of the thyroid which had microfollicular pattern on F&A. After discussion of alternatives, risks and complications, the patient elected to proceed to left thyroidectomy with possible ______ thyroidectomy if this proved to be a malignancy.  DESCRIPTION OF PROCEDURE:  The patient was brought to the operating room and after satisfactory general endotracheal anesthesia had been obtained, placed supine on the operating room table with the head extended. The neck was prepped and draped. A curvilinear incision was made 2 fingerbreadths above the clavicular heads and divided through the platysma. Subplatysmal flaps were placed superiorly and inferiorly and self retaining retractor placed. The prethyroid fascia was opened in the midline. The strap muscles were lifted up and dissected off of the nodule and it was retracted medially. It was quite soft and some of the material broke through the capsule and was sent for frozen and this all had follicular looking cells in it.  I freed up the superior pole using clips and 2-0 sutures and trying to take just the vessels leaving no remnant of thyroid behind there. With that done and a little bit of the inferior pole freed up, I divided the middle thyroid vein and rotated  the thyroid medially. I initially didnt locate the recurrent laryngeal nerves began staying within the thyroid capsule and trying to divide the small strands to continue to rotate the thyroid medially and drop the blood vessels laterally until we had the area of the nerve identified and was able to see the nerve. There had been an extra little nodule of thyroid tissue overlying the nerve that had to be dissected off and this was a fairly tedious dissection but we were able to confirm the identity of the nerve and trace it in both directions. Once that had been done, I completed divided small vessels coming to the thyroid and then this had the thyroid completely freed up except for its connection through the isthmus. The isthmus was freed up off of the trachea and divided with clamps. This side was suture ligated with 3-0 Vicryl. The wound was irrigated and appeared to be dry. The nerve appeared to be intact. I saw one completely intact parathyroid which was a little bit off the thyroid and the second was almost in the capsule and had to be dissected off and looked a little ecchymotic but viable.  After a final irrigation, the wound was closed with some 3-0 Vicryl in the midline, 3-0 Vicryl in the platysma, staples and Steri-Strips on the skin.  ______ all follicular cells were seen, although there is some concern there may have been some abnormal cells suggestive of a papillary variant. This could not be clearly diagnosed. Therefore the procedure  was terminated. Sterile dressings applied. The patient tolerated the procedure well. There were no operative complications. All counts were correct. DD:  02/10/00 TD:  02/11/00 Job: [id number]

## 2010-10-11 NOTE — Op Note (Signed)
[name], [name]                           ACCOUNT NO.:  [id number]   MEDICAL RECORD NO.:  [id number]                   PATIENT TYPE:  AMB   LOCATION:  DSC                                  FACILITY:  [facility]   PHYSICIAN:  [name], M.D.           DATE OF BIRTH:  [birth year]   DATE OF PROCEDURE:  02/23/2002  DATE OF DISCHARGE:                                 OPERATIVE REPORT   PREOPERATIVE DIAGNOSIS:  Umbilical hernia.   POSTOPERATIVE DIAGNOSIS:  Umbilical hernia.   PROCEDURE:  Repair of umbilical hernia with mesh.   SURGEON:  [name], M.D.   ANESTHESIA:  General endotracheal.   CLINICAL HISTORY:  This patient is a 40 year old woman with a recently-  diagnosed umbilical hernia, which was a little difficult to actually feel  because of her obesity, and I was not completely sure we could completely  always reduce it.  We elected to proceed to a repair.   DESCRIPTION OF PROCEDURE:  The patient was seen in the holding area and had  no further questions.  The umbilicus was palpated and I could still identify  the hernia.  She was taken into the operating room and after satisfactory  general endotracheal anesthesia had been obtained, the abdomen was prepped  and draped.  I injected some Marcaine around the umbilicus to see if we  could help with postoperative analgesia.  Since she had an innie, I made a  vertical incision starting at the depths of the umbilicus and coming out to  the skin.  By spreading the skin a little bit and putting some self-  retaining retractors, I found the hernia sac, which was attached to the  undersurface of the umbilical skin.  It contained some fluid and omentum.  The sac was excised and the omentum reduced.  I could only identify the  single defect, and it was about 1-1/2 fingerbreadths across.  Once  everything was reduced and I had the fascial edges cleaned up nicely, I put  a piece of Marlex mesh as a plug into the defect,  and it filled the defect  nicely.  It was held in place with a hemostat while I closed the defect with  four sutures of 0 Prolene, incorporating the mesh in each one as we bridged  the defect.  These tied down easily, and there was no tension whatsoever.   Everything appeared to be dry, so I then closed the incision by taking a 3-0  Vicryl to tack the umbilical skin down to the deeper tissues and then closed  the skin with 4-0 Monocryl subcuticular plus Steri-Strips.  The patient  tolerated the procedure well.  There were no operative complications, and  all counts were correct.  [name], M.D.    CJS/MEDQ  D:  02/23/2002  T:  02/24/2002  Job:  [id number]   cc:   Reuben Likes, M.D.   Willa Rough, MD [facility]

## 2010-12-18 ENCOUNTER — Other Ambulatory Visit: Payer: Self-pay | Admitting: Cardiology

## 2011-06-12 ENCOUNTER — Ambulatory Visit: Payer: Self-pay

## 2011-06-23 ENCOUNTER — Encounter: Payer: Self-pay | Admitting: Family Medicine

## 2011-06-23 ENCOUNTER — Ambulatory Visit (INDEPENDENT_AMBULATORY_CARE_PROVIDER_SITE_OTHER): Payer: PRIVATE HEALTH INSURANCE | Admitting: Family Medicine

## 2011-06-23 DIAGNOSIS — F319 Bipolar disorder, unspecified: Secondary | ICD-10-CM

## 2011-06-23 DIAGNOSIS — L853 Xerosis cutis: Secondary | ICD-10-CM

## 2011-06-23 DIAGNOSIS — E782 Mixed hyperlipidemia: Secondary | ICD-10-CM

## 2011-06-23 DIAGNOSIS — R238 Other skin changes: Secondary | ICD-10-CM

## 2011-06-23 DIAGNOSIS — G4733 Obstructive sleep apnea (adult) (pediatric): Secondary | ICD-10-CM

## 2011-06-23 DIAGNOSIS — E109 Type 1 diabetes mellitus without complications: Secondary | ICD-10-CM

## 2011-06-23 DIAGNOSIS — I10 Essential (primary) hypertension: Secondary | ICD-10-CM

## 2011-06-23 NOTE — Progress Notes (Signed)
  Subjective:    Patient ID: [name], female    DOB: August 17, 1970, 41 y.o.   MRN: [id number]  HPI New to establish.  Previous MD- Zachery Dauer at [place].  GYN- Romine.  PsychNolen Mu.  Rushie Goltz.  Cards- Myrtis Ser.  Pulm- Clance.   Last CPE- 'i never had one at [facility]'.  DM- chronic problem, on Metformin, Glimepiride, Lantus.  dx'd in 2000.  Overdue on eye exam.  Checks CBGs TID- reports these have been 'high'.  Just started [employer] 2 weeks ago.  Last A1C was 8.6 in October.  Due for labs.  Denies symptomatic lows, neuropathy, retinopathy.  Hyperlipidemia- chronic problem, on Lipitor 40mg .  Labs last checked in either June or October.  No abd pain, N/V, myalgias.  HTN- chronic problem, on Coreg, Lisinopril, HCTZ.  No CP, SOB, HAs, visual changes, edema.  Sleep Apnea- chronic problem, was seeing Dr Shelle Iron.  On CPAP machine.  Bipolar- chronic problem, following w/ Dr Nolen Mu.  She prescribes the Abilify, Tegretol, Lamictal, Tiagabine.  Mole- L side of upper forehead, enlarging rapidly.  First noticed 'a few months ago'.  DermCraige Cotta  Dry skin- bilateral outer ears, large patch behind R ear.   Review of Systems For ROS see HPI     Objective:   Physical Exam  Vitals reviewed. Constitutional: She is oriented to person, place, and time. She appears well-developed and well-nourished. No distress.       obese  HENT:  Head: Normocephalic and atraumatic.  Eyes: Conjunctivae and EOM are normal. Pupils are equal, round, and reactive to light.  Neck: Normal range of motion. Neck supple. No thyromegaly present.  Cardiovascular: Normal rate, regular rhythm, normal heart sounds and intact distal pulses.   No murmur heard. Pulmonary/Chest: Effort normal and breath sounds normal. No respiratory distress.  Abdominal: Soft. She exhibits no distension. There is no tenderness.  Musculoskeletal: She exhibits no edema.  Lymphadenopathy:    She has no cervical adenopathy.  Neurological: She is  alert and oriented to person, place, and time.  Skin: Skin is warm and dry. Rash (very dry, scaly patches behind ears) noted.  Psychiatric: She has a normal mood and affect. Her behavior is normal.          Assessment & Plan:

## 2011-06-23 NOTE — Patient Instructions (Signed)
Schedule your complete physical in the next 4-6 weeks We'll notify you of your lab results and make any changes if needed Keep up the good work w/ [employer]- you can totally do this!!! Call with any questions or concerns Welcome!  We're glad to have you!!!

## 2011-06-24 LAB — CBC WITH DIFFERENTIAL/PLATELET
Basophils Absolute: 0 10*3/uL (ref 0.0–0.1)
Basophils Relative: 0.6 % (ref 0.0–3.0)
Eosinophils Absolute: 0.6 10*3/uL (ref 0.0–0.7)
HCT: 39.9 % (ref 36.0–46.0)
Hemoglobin: 13.1 g/dL (ref 12.0–15.0)
Lymphs Abs: 3 10*3/uL (ref 0.7–4.0)
MCV: 87 fl (ref 78.0–100.0)
Monocytes Absolute: 0.8 10*3/uL (ref 0.1–1.0)
RBC: 4.59 Mil/uL (ref 3.87–5.11)
RDW: 14.2 % (ref 11.5–14.6)

## 2011-06-24 LAB — HEPATIC FUNCTION PANEL
ALT: 29 U/L (ref 0–35)
Albumin: 3.4 g/dL — ABNORMAL LOW (ref 3.5–5.2)
Alkaline Phosphatase: 76 U/L (ref 39–117)
Bilirubin, Direct: 0 mg/dL (ref 0.0–0.3)
Total Protein: 7.1 g/dL (ref 6.0–8.3)

## 2011-06-24 LAB — BASIC METABOLIC PANEL
Calcium: 9.1 mg/dL (ref 8.4–10.5)
Chloride: 99 mEq/L (ref 96–112)
Potassium: 4.7 mEq/L (ref 3.5–5.1)

## 2011-06-24 LAB — HEMOGLOBIN A1C: Hgb A1c MFr Bld: 9.6 % — ABNORMAL HIGH (ref 4.6–6.5)

## 2011-06-24 LAB — LIPID PANEL
HDL: 66.6 mg/dL (ref >39.00)
Total CHOL/HDL Ratio: 3
VLDL: 31.8 mg/dL (ref 0.0–40.0)

## 2011-06-24 LAB — TSH: TSH: 1.61 u[IU]/mL (ref 0.35–5.50)

## 2011-06-27 ENCOUNTER — Encounter: Payer: Self-pay | Admitting: *Deleted

## 2011-06-27 ENCOUNTER — Encounter: Payer: Self-pay | Admitting: Family Medicine

## 2011-06-27 ENCOUNTER — Telehealth: Payer: Self-pay | Admitting: *Deleted

## 2011-06-27 ENCOUNTER — Ambulatory Visit (INDEPENDENT_AMBULATORY_CARE_PROVIDER_SITE_OTHER): Payer: PRIVATE HEALTH INSURANCE | Admitting: Family Medicine

## 2011-06-27 VITALS — BP 120/75 | HR 85 | Temp 98.5°F | Ht 69.0 in | Wt 367.6 lb

## 2011-06-27 DIAGNOSIS — E109 Type 1 diabetes mellitus without complications: Secondary | ICD-10-CM

## 2011-06-27 MED ORDER — LIRAGLUTIDE 18 MG/3ML ~~LOC~~ SOLN: 1.2000 mg | Freq: Every day | SUBCUTANEOUS | Status: DC

## 2011-06-27 MED ORDER — INSULIN GLARGINE 100 UNIT/ML ~~LOC~~ SOLN: SUBCUTANEOUS | Status: DC

## 2011-06-27 MED ORDER — INSULIN PEN NEEDLE 31G X 5 MM MISC: Status: DC

## 2011-06-27 MED ORDER — INSULIN PEN NEEDLE 32G X 5 MM MISC: 1.0000 [IU] | Freq: Every day | Status: DC

## 2011-06-27 NOTE — Patient Instructions (Addendum)
Follow up as scheduled Start the Victoza daily Start w/ 0.60ml daily x1 week and then increase to 1.19ml daily Call with any questions or concerns You can totally do this!!!

## 2011-06-27 NOTE — Telephone Encounter (Signed)
Pharmacy left vm stating they do not have the 32 g X 5mm available could pt use the 32g X 6mm per the other needles are on back order, advised per MD Tabori that pt could use the 32g X75mm instead of the novatwist, [employer] rep understood and also verifed the correct needles and pen were sent for the lantus as well.pt aware per at pharmacy now

## 2011-07-01 NOTE — Assessment & Plan Note (Signed)
Chronic problem.  Due for labs.  Tolerating statin w/out difficulty.  Check labs.  Adjust meds prn  

## 2011-07-01 NOTE — Assessment & Plan Note (Signed)
Chronic problem.  Well controlled.  Tolerating meds w/out difficulty.  No changes.

## 2011-07-01 NOTE — Assessment & Plan Note (Signed)
Chronic problem.  Recently joined [employer].  Is resuming exercise.  Aware that this is a problem for her and contributing to all her other problems.  Is willing to work hard to correct this.  Will follow closely.

## 2011-07-01 NOTE — Assessment & Plan Note (Signed)
Following w/ Dr Shelle Iron.

## 2011-07-01 NOTE — Assessment & Plan Note (Signed)
Chronic problem.  Overdue on eye exam- encouraged her to schedule.  Currently asymptomatic but pt reports poor CBG control.  Overdue for labs.  Check labs.  Adjust meds prn

## 2011-07-01 NOTE — Assessment & Plan Note (Signed)
New.  Following w/ Dr Nolen Mu.  meds are likely contributing to weight gain.  Will follow along and assist as able.

## 2011-07-04 NOTE — Progress Notes (Signed)
  Subjective:    Patient ID: [name], female    DOB: 11/19/1970, 41 y.o.   MRN: [id number]  HPI DM- based on recent A1C, pt here to start Victoza.  Needs instructions on use.   Review of Systems For ROS see HPI     Objective:   Physical Exam  Vitals reviewed. Constitutional: She appears well-developed and well-nourished. No distress.  Skin: Skin is warm and dry. No rash noted.  Psychiatric: She has a normal mood and affect. Her behavior is normal.          Assessment & Plan:

## 2011-07-04 NOTE — Assessment & Plan Note (Signed)
Based on recent A1C pt needs better glycemic control.  Decision was made to start Victoza to assist w/ weight loss.  Pt was instructed on proper use and gave herself 1st injxn in office.  Total time spent w/ pt 19 min, >50% spent counseling.

## 2011-07-23 ENCOUNTER — Telehealth: Payer: Self-pay | Admitting: Family Medicine

## 2011-07-23 MED ORDER — BECLOMETHASONE DIPROPIONATE 80 MCG/ACT IN AERS: 2.0000 | INHALATION_SPRAY | Freq: Two times a day (BID) | RESPIRATORY_TRACT | Status: DC

## 2011-07-23 NOTE — Telephone Encounter (Signed)
rx sent to pharmacy by e-script  

## 2011-07-23 NOTE — Telephone Encounter (Signed)
Patient states she needs dr Beverely Low to write her rx for Qvar. [facility] [address].

## 2011-08-06 ENCOUNTER — Ambulatory Visit (INDEPENDENT_AMBULATORY_CARE_PROVIDER_SITE_OTHER): Payer: PRIVATE HEALTH INSURANCE | Admitting: Family Medicine

## 2011-08-06 ENCOUNTER — Encounter: Payer: Self-pay | Admitting: Family Medicine

## 2011-08-06 DIAGNOSIS — Z0000 Encounter for general adult medical examination without abnormal findings: Secondary | ICD-10-CM

## 2011-08-06 MED ORDER — OMEPRAZOLE-SODIUM BICARBONATE 40-1100 MG PO CAPS: 1.0000 | ORAL_CAPSULE | Freq: Every day | ORAL | Status: AC

## 2011-08-06 NOTE — Progress Notes (Signed)
  Subjective:    Patient ID: [name], female    DOB: [birth year], 41 y.o.   MRN: [id number]  HPI CPE- UTD on GYN (Rom[facility]).  No concerns.   Review of Systems Patient reports no vision/ hearing changes, adenopathy,fever, weight change,  persistant/recurrent hoarseness , swallowing issues, chest pain, palpitations, edema, persistant/recurrent cough, hemoptysis, dyspnea (rest/exertional/paroxysmal nocturnal), gastrointestinal bleeding (melena, rectal bleeding), abdominal pain, bowel changes, GU symptoms (dysuria, hematuria, incontinence), Gyn symptoms (abnormal  bleeding, pain),  syncope, focal weakness, memory loss, numbness & tingling, skin/hair/nail changes, abnormal bruising or bleeding, anxiety, or depression.   + GERD- on Zegerid OTC w/out relief.  Has previously done Nexium, Zantac, 'everything but prilosec'    Objective:   Physical Exam General Appearance:    Alert, cooperative, no distress, appears stated age, morbidly obese  Head:    Normocephalic, without obvious abnormality, atraumatic  Eyes:    PERRL, conjunctiva/corneas clear, EOM's intact, fundi    benign, both eyes  Ears:    Normal TM's and external ear canals, both ears  Nose:   Nares normal, septum midline, mucosa normal, no drainage    or sinus tenderness  Throat:   Lips, mucosa, and tongue normal; teeth and gums normal  Neck:   Supple, symmetrical, trachea midline, no adenopathy;    Thyroid: no enlargement/tenderness/nodules  Back:     Symmetric, no curvature, ROM normal, no CVA tenderness  Lungs:     Clear to auscultation bilaterally, respirations unlabored  Chest Wall:    No tenderness or deformity   Heart:    Regular rate and rhythm, S1 and S2 normal, no murmur, rub   or gallop  Breast Exam:    Deferred to GYN  Abdomen:     Soft, non-tender, bowel sounds active all four quadrants,    no masses, no organomegaly  Genitalia:    Deferred to GYN  Rectal:    Extremities:   Extremities normal, atraumatic, no cyanosis  or edema  Pulses:   2+ and symmetric all extremities  Skin:   Skin color, texture, turgor normal, no rashes or lesions  Lymph nodes:   Cervical, supraclavicular, and axillary nodes normal  Neurologic:   CNII-XII intact, normal strength, sensation and reflexes    throughout          Assessment & Plan:

## 2011-08-06 NOTE — Patient Instructions (Signed)
Schedule your follow up in May- don't eat before this appt You look good!  Keep it up! Call with any questions or concerns Happy Spring!!!

## 2011-08-06 NOTE — Assessment & Plan Note (Signed)
Pt's PE WNL w/ exception of obesity.  Reviewed labs from previous visit.  UTD on GYN.  Anticipatory guidance provided.

## 2011-08-25 ENCOUNTER — Other Ambulatory Visit: Payer: Self-pay

## 2011-08-25 NOTE — Telephone Encounter (Signed)
msg from patient requesting Lisinopril for a 90 day supply sent to [facility] on [address]. Insurance denied Zegrid and patient wants to get something generic to replace it.   Please advise      KP

## 2011-08-26 ENCOUNTER — Telehealth: Payer: Self-pay | Admitting: Family Medicine

## 2011-08-26 NOTE — Telephone Encounter (Signed)
error 

## 2011-08-26 NOTE — Telephone Encounter (Signed)
Please advise 

## 2011-08-27 ENCOUNTER — Other Ambulatory Visit: Payer: Self-pay | Admitting: *Deleted

## 2011-08-27 MED ORDER — OMEPRAZOLE 40 MG PO CPDR: 40.0000 mg | DELAYED_RELEASE_CAPSULE | Freq: Every day | ORAL | Status: DC

## 2011-08-27 MED ORDER — LISINOPRIL 20 MG PO TABS: 20.0000 mg | ORAL_TABLET | Freq: Every day | ORAL | Status: DC

## 2011-08-27 NOTE — Telephone Encounter (Signed)
Left vm to advise medication change and sent to pharmacy, advised to call office if any questions.

## 2011-08-27 NOTE — Telephone Encounter (Signed)
rx sent to pharmacy by e-script Called pt to advise medication change,

## 2011-08-27 NOTE — Telephone Encounter (Signed)
Ok for Omeprazole 40mg  daily to replace Zegerid Ok for Lisinopril #90, 1 refill

## 2011-08-27 NOTE — Telephone Encounter (Signed)
Noted form received in office for pt prescription request for nexium, pantoprazole, omeprazole, noted MD Tabori filled out form, faxed information to advise pt is to have step two drug Omepra/bicar 40-1100

## 2011-09-08 ENCOUNTER — Other Ambulatory Visit: Payer: Self-pay | Admitting: *Deleted

## 2011-09-08 MED ORDER — CARVEDILOL 25 MG PO TABS: ORAL_TABLET | ORAL | Status: DC

## 2011-09-08 MED ORDER — ATORVASTATIN CALCIUM 40 MG PO TABS: 40.0000 mg | ORAL_TABLET | Freq: Every day | ORAL | Status: DC

## 2011-09-08 NOTE — Telephone Encounter (Signed)
Rx sent 

## 2011-09-15 ENCOUNTER — Other Ambulatory Visit: Payer: Self-pay | Admitting: *Deleted

## 2011-09-15 MED ORDER — METFORMIN HCL 1000 MG PO TABS: 1000.0000 mg | ORAL_TABLET | Freq: Two times a day (BID) | ORAL | Status: DC

## 2011-09-15 NOTE — Telephone Encounter (Signed)
Ok to fill for 90 day supply?

## 2011-09-15 NOTE — Telephone Encounter (Signed)
Rx sent 

## 2011-09-15 NOTE — Telephone Encounter (Signed)
Ok for 90 day supply, 3 refills

## 2011-09-18 ENCOUNTER — Other Ambulatory Visit: Payer: Self-pay | Admitting: *Deleted

## 2011-09-18 MED ORDER — INSULIN GLARGINE 100 UNIT/ML ~~LOC~~ SOLN: SUBCUTANEOUS | Status: DC

## 2011-09-26 ENCOUNTER — Encounter: Payer: PRIVATE HEALTH INSURANCE | Admitting: Family Medicine

## 2011-09-29 ENCOUNTER — Other Ambulatory Visit: Payer: Self-pay | Admitting: *Deleted

## 2011-09-29 MED ORDER — GLIMEPIRIDE 4 MG PO TABS: 4.0000 mg | ORAL_TABLET | Freq: Two times a day (BID) | ORAL | Status: DC

## 2011-09-29 NOTE — Telephone Encounter (Signed)
Rx sent 

## 2011-11-12 ENCOUNTER — Ambulatory Visit (INDEPENDENT_AMBULATORY_CARE_PROVIDER_SITE_OTHER): Payer: PRIVATE HEALTH INSURANCE | Admitting: Family Medicine

## 2011-11-12 ENCOUNTER — Encounter: Payer: Self-pay | Admitting: Family Medicine

## 2011-11-12 VITALS — BP 121/81 | HR 99 | Temp 97.9°F | Ht 69.0 in | Wt 361.4 lb

## 2011-11-12 DIAGNOSIS — I10 Essential (primary) hypertension: Secondary | ICD-10-CM

## 2011-11-12 DIAGNOSIS — H6120 Impacted cerumen, unspecified ear: Secondary | ICD-10-CM

## 2011-11-12 DIAGNOSIS — E109 Type 1 diabetes mellitus without complications: Secondary | ICD-10-CM

## 2011-11-12 DIAGNOSIS — E782 Mixed hyperlipidemia: Secondary | ICD-10-CM

## 2011-11-12 LAB — HEPATIC FUNCTION PANEL
ALT: 23 U/L (ref 0–35)
Alkaline Phosphatase: 84 U/L (ref 39–117)
Bilirubin, Direct: 0 mg/dL (ref 0.0–0.3)
Total Bilirubin: 0.1 mg/dL — ABNORMAL LOW (ref 0.3–1.2)

## 2011-11-12 LAB — BASIC METABOLIC PANEL
CO2: 25 mEq/L (ref 19–32)
Calcium: 8.9 mg/dL (ref 8.4–10.5)
Chloride: 98 mEq/L (ref 96–112)
Potassium: 3.9 mEq/L (ref 3.5–5.1)
Sodium: 136 mEq/L (ref 135–145)

## 2011-11-12 LAB — LIPID PANEL
Cholesterol: 171 mg/dL (ref 0–200)
HDL: 63.6 mg/dL (ref >39.00)
Triglycerides: 225 mg/dL — ABNORMAL HIGH (ref 0.0–149.0)

## 2011-11-12 LAB — LDL CHOLESTEROL, DIRECT: Direct LDL: 90.8 mg/dL

## 2011-11-12 NOTE — Assessment & Plan Note (Signed)
New.  Ears successfully irrigated and TMs normal.

## 2011-11-12 NOTE — Assessment & Plan Note (Signed)
Chronic problem.  Has lost 7 lbs since last visit despite poor eating.  Encouraged her to continue her weight loss efforts.  Will follow.

## 2011-11-12 NOTE — Assessment & Plan Note (Signed)
Chronic problem.  Well controlled on current meds.  Asymptomatic.  No changes. 

## 2011-11-12 NOTE — Assessment & Plan Note (Signed)
Chronic problem.  Pt admits to poor eating during M-I-L's illness and subsequent death.  Fears A1C will be poor.  Stressed importance of resuming healthy diet and regular exercise.  UTD on eye exam.  Will continue to follow closely.

## 2011-11-12 NOTE — Progress Notes (Signed)
  Subjective:    Patient ID: [name], female    DOB: 10-15-1970, 41 y.o.   MRN: [id number]  HPI Hyperlipidemia- chronic problem, on Lipitor.  Denies abd pain, N/V, myalgias  DM- chronic problem, on Lantus, amaryl, Victoza, metformin.  Fears A1C won't be good b/c of recent stress of caring for dying mother-in-law.  Denies symptomatic lows- no shaking, dizziness.  No CP, SOB, HAs, visual changes, edema.  HTN- chronic problem, on Lisinopril, Coreg.  Excellent control.  Asymptomatic.  Obesity- has last 7 lbs since last visit.  Applauded her efforts.  Ears clogged- bilateral, R>L.  Not painful.  + decreased hearing.   Review of Systems For ROS see HPI     Objective:   Physical Exam  Vitals reviewed. Constitutional: She is oriented to person, place, and time. She appears well-developed and well-nourished. No distress.       obese  HENT:  Head: Normocephalic and atraumatic.       TMs obscured by wax bilaterally   Eyes: Conjunctivae and EOM are normal. Pupils are equal, round, and reactive to light.  Neck: Normal range of motion. Neck supple. No thyromegaly present.  Cardiovascular: Normal rate, regular rhythm, normal heart sounds and intact distal pulses.   No murmur heard. Pulmonary/Chest: Effort normal and breath sounds normal. No respiratory distress.  Abdominal: Soft. She exhibits no distension. There is no tenderness.  Musculoskeletal: She exhibits no edema.  Lymphadenopathy:    She has no cervical adenopathy.  Neurological: She is alert and oriented to person, place, and time.  Skin: Skin is warm and dry.  Psychiatric: She has a normal mood and affect. Her behavior is normal.        Assessment & Plan:

## 2011-11-12 NOTE — Patient Instructions (Addendum)
Follow up in 3-4 months to recheck A1C Keep up the good work on healthy diet and regular exercise We'll notify you of your lab results Call with any questions or concerns Happy Birthday!!!

## 2011-11-12 NOTE — Assessment & Plan Note (Signed)
Chronic problem.  Tolerating statin w/out difficulty.  Check labs.  Adjust meds prn  

## 2011-11-13 ENCOUNTER — Encounter: Payer: Self-pay | Admitting: *Deleted

## 2011-12-12 ENCOUNTER — Telehealth: Payer: Self-pay | Admitting: Family Medicine

## 2011-12-12 MED ORDER — ATORVASTATIN CALCIUM 40 MG PO TABS: 40.0000 mg | ORAL_TABLET | Freq: Every day | ORAL | Status: DC

## 2011-12-12 MED ORDER — CARVEDILOL 25 MG PO TABS: ORAL_TABLET | ORAL | Status: DC

## 2011-12-12 NOTE — Telephone Encounter (Signed)
rx sent to pharmacy by e-script  

## 2011-12-12 NOTE — Telephone Encounter (Signed)
Refill: carvedilol 25mg  tablets. Take 1 tablet by mouth twice daily. Qty 180. Last fill 09-09-11

## 2011-12-12 NOTE — Telephone Encounter (Signed)
Refill: Atorvastatin 40mg  tablets. Take 1 tablet by mouth daily. Qty 90. Last fill 09-08-11

## 2011-12-29 ENCOUNTER — Other Ambulatory Visit: Payer: Self-pay | Admitting: Family Medicine

## 2011-12-29 MED ORDER — OMEPRAZOLE 40 MG PO CPDR: 40.0000 mg | DELAYED_RELEASE_CAPSULE | Freq: Every day | ORAL | Status: DC

## 2011-12-29 MED ORDER — GLIMEPIRIDE 4 MG PO TABS: 4.0000 mg | ORAL_TABLET | Freq: Two times a day (BID) | ORAL | Status: DC

## 2011-12-29 NOTE — Telephone Encounter (Signed)
Refill done.  

## 2011-12-29 NOTE — Telephone Encounter (Signed)
Also requesting refill Glimepiride (Tab) 4 MG Take 1 tablet (4 mg total) by mouth 2 (two) times daily.#180 last fill 5.6.13

## 2011-12-29 NOTE — Telephone Encounter (Signed)
Refill Omeprazole 40 MG Take 1 capsule (40 mg total) by mouth daily. #30-last fill 7.6.13 Last ov 6.19.13 annual exam

## 2012-01-08 ENCOUNTER — Other Ambulatory Visit: Payer: Self-pay | Admitting: Family Medicine

## 2012-01-08 MED ORDER — BECLOMETHASONE DIPROPIONATE 80 MCG/ACT IN AERS: 2.0000 | INHALATION_SPRAY | Freq: Two times a day (BID) | RESPIRATORY_TRACT | Status: DC

## 2012-01-08 NOTE — Telephone Encounter (Signed)
rx sent to pharmacy by e-script  

## 2012-01-08 NOTE — Telephone Encounter (Signed)
Refill QVAR 80 MCG Oral Inhaler, #8.700000000000001 inhale 2 puffs into the lungs 2 (two) times daily Last fill 7.6.13 Last ov 6.19.13 V70

## 2012-01-21 ENCOUNTER — Other Ambulatory Visit: Payer: Self-pay | Admitting: Family Medicine

## 2012-01-21 MED ORDER — LISINOPRIL 20 MG PO TABS: 20.0000 mg | ORAL_TABLET | Freq: Every day | ORAL | Status: DC

## 2012-01-21 NOTE — Telephone Encounter (Signed)
refill Lisinopril (Tab) PRINIVIL,ZESTRIL 20 MG Take 1 tablet (20 mg total) by mouth daily #90 last fill 6.26.13 Last ov 6.19.13 f/u DM

## 2012-01-21 NOTE — Telephone Encounter (Signed)
rx sent to pharmacy by e-script  

## 2012-02-16 ENCOUNTER — Ambulatory Visit (INDEPENDENT_AMBULATORY_CARE_PROVIDER_SITE_OTHER): Payer: PRIVATE HEALTH INSURANCE | Admitting: Family

## 2012-02-16 ENCOUNTER — Encounter: Payer: Self-pay | Admitting: Family

## 2012-02-16 VITALS — BP 136/76 | HR 72 | Temp 98.4°F | Resp 16 | Wt 365.0 lb

## 2012-02-16 DIAGNOSIS — J309 Allergic rhinitis, unspecified: Secondary | ICD-10-CM

## 2012-02-16 MED ORDER — BENZONATATE 100 MG PO CAPS: 100.0000 mg | ORAL_CAPSULE | Freq: Three times a day (TID) | ORAL | Status: AC | PRN

## 2012-02-16 MED ORDER — FLUTICASONE PROPIONATE 50 MCG/ACT NA SUSP: 2.0000 | Freq: Every day | NASAL | Status: DC

## 2012-02-16 MED ORDER — AZITHROMYCIN 250 MG PO TABS: ORAL_TABLET | ORAL | Status: DC

## 2012-02-16 NOTE — Progress Notes (Signed)
Subjective:    Patient ID: [name], female    DOB: 1971/04/18, 41 y.o.   MRN: [id number]  HPI  Ms.  [name] is a 41 yr old female who presents today with chief complaint of cough. Reports light wheezing x 1 week.  Thursday started with nasal congestion/sneezing on Thursday.  Sunday AM (9/22) cough started.  Tried robitussin cough gels which helped some. She reports associated chest soreness from cough. Denies associated fever or sick contacts.  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 She has been taking zyrtec   Review of Systems See HPI  Past Medical History  Diagnosis Date  . Asthma     pt stated treated as ashtma but not really asthma  . Chicken pox   . Depression   . Diabetes mellitus   . GERD (gastroesophageal reflux disease)   . Allergy   . Hypertension     readings  . Hyperlipidemia   . Migraine   . UTI (urinary tract infection)   . Irregular heartbeat   . Sleep apnea     History   Social History  . Marital Status: Married    Spouse Name: N/A    Number of Children: N/A  . Years of Education: N/A   Occupational History  . Not on file.   Social History Main Topics  . Smoking status: Never Smoker   . Smokeless tobacco: Not on file  . Alcohol Use: No  . Drug Use: No  . Sexually Active: Not on file   Other Topics Concern  . Not on file   Social History Narrative  . No narrative on file    Past Surgical History  Procedure Date  . Cesarean section     1995  . Thyroidectomy, partial 2001    removed left  . Abdominal hysterectomy     20 09  . Back surgery 1999  . Navel repair 2003  . Ankle surgery 1989    left    Family History  Problem Relation Age of Onset  . Hyperlipidemia Mother   . Mental illness Mother   . Diabetes Mother   . Heart disease Mother   . Early death Mother   . Cancer Father   . Hypertension Father   . Stroke Maternal Aunt   . Stroke Paternal Grandfather   . Mental illness Paternal Grandfather     No  Known Allergies  Current Outpatient Prescriptions on File Prior to Visit  Medication Sig Dispense Refill  . albuterol (PROVENTIL HFA;VENTOLIN HFA) 108 (90 BASE) MCG/ACT inhaler Inhale 2 puffs into the lungs every 4 (four) hours as needed.      . ARIPiprazole (ABILIFY) 5 MG tablet Take 5 mg by mouth daily.      [name] aspirin 81 MG tablet Take 160 mg by mouth daily.      [name] atorvastatin (LIPITOR) 40 MG tablet Take 1 tablet (40 mg total) by mouth daily.  90 tablet  1  . beclomethasone (QVAR) 80 MCG/ACT inhaler Inhale 2 puffs into the lungs 2 (two) times daily.  1 Inhaler  3  . Calcium Carbonate-Vitamin D (CALTRATE 600+D PO) Take 1 tablet by mouth 2 (two) times daily.      . carbamazepine (TEGRETOL XR) 200 MG 12 hr tablet Take 200 mg by mouth 2 (two) times daily.      . carvedilol (COREG) 25 MG tablet TAKE 1 TABLET BY MOUTH TWICE DAILY  180 tablet  1  . diflorasone-emollient (  APEXICON E) 0.05 % CREA Apply 1 application topically 2 (two) times daily.      [name] estradiol (ESTRACE) 1 MG tablet Take 1 mg by mouth 2 (two) times daily.      [name] gabapentin (NEURONTIN) 600 MG tablet Take 600 mg by mouth 3 (three) times daily.      [name] glimepiride (AMARYL) 4 MG tablet Take 1 tablet (4 mg total) by mouth 2 (two) times daily.  180 tablet  0  . glucose blood test strip 1 each by Other route as needed. Use as instructed      . insulin glargine (LANTUS) 100 UNIT/ML injection PEN FOR THIS PT, INJECT QHS  3 mL  3  . Insulin Pen Needle (NOVOTWIST) 32G X 5 MM MISC 1 Units by Does not apply route daily.  100 each  3  . Insulin Pen Needle 31G X 5 MM MISC USE AS DIRECTED  100 each  3  . lamoTRIgine (LAMICTAL) 200 MG tablet Take 200 mg by mouth 2 (two) times daily.      . Liraglutide (VICTOZA) 18 MG/3ML SOLN Inject 0.2 mLs (1.2 mg total) into the skin daily.  6 mL  6  . lisinopril (PRINIVIL,ZESTRIL) 20 MG tablet Take 1 tablet (20 mg total) by mouth daily.  90 tablet  0  . metFORMIN (GLUCOPHAGE) 1000 MG tablet Take 1 tablet  (1,000 mg total) by mouth 2 (two) times daily with a meal.  180 tablet  3  . mometasone (ELOCON) 0.1 % cream Apply 1 application topically as needed.      [name] omeprazole (PRILOSEC) 40 MG capsule Take 1 capsule (40 mg total) by mouth daily.  30 capsule  3  . tiaGABine (GABITRIL) 4 MG tablet Take 8 mg by mouth 2 (two) times daily.      . cholecalciferol (VITAMIN D) 1000 UNITS tablet Take 2,000 Units by mouth daily.      . fluticasone (FLONASE) 50 MCG/ACT nasal spray Place 2 sprays into the nose daily.  16 g  2    BP 136/76  Pulse 72  Temp 98.4 F (36.9 C) (Oral)  Resp 16  Wt 365 lb (165.563 kg)  SpO2 96%       Objective:   Physical Exam  Constitutional: She is oriented to person, place, and time. She appears well-developed and well-nourished. No distress.  HENT:  Head: Normocephalic and atraumatic.  Right Ear: Tympanic membrane and ear canal normal.  Left Ear: Tympanic membrane and ear canal normal.  Mouth/Throat: No oropharyngeal exudate or posterior oropharyngeal edema.  Cardiovascular: Normal rate and regular rhythm.   No murmur heard. Pulmonary/Chest: Effort normal and breath sounds normal. No respiratory distress. She has no wheezes. She has no rales. She exhibits no tenderness.  Musculoskeletal: She exhibits no edema.  Neurological: She is alert and oriented to person, place, and time.  Skin: Skin is warm and dry. No rash noted. No erythema. No pallor.  Psychiatric: She has a normal mood and affect. Her behavior is normal. Judgment and thought content normal.          Assessment & Plan:

## 2012-02-16 NOTE — Patient Instructions (Addendum)
Continue Zyrtec, add flonase. If symptoms worsen, or if cough is not improved in 2-3 days, please start zithromax.

## 2012-02-16 NOTE — Assessment & Plan Note (Signed)
Deteriorated, likely due to rag weed.  I think post nasal drip is contributing to her cough. Continue albuterol and Qvar.  I have recommended that she start flonase in addition to her zyrtec.  Add tessalon for cough. If symptoms worsen, or if no improvement in 2-3 days, I have instructed her to start z-pak.

## 2012-02-18 ENCOUNTER — Other Ambulatory Visit: Payer: Self-pay | Admitting: Family Medicine

## 2012-02-18 MED ORDER — INSULIN PEN NEEDLE 32G X 5 MM MISC: 1.0000 [IU] | Freq: Every day | Status: DC

## 2012-02-18 MED ORDER — INSULIN GLARGINE 100 UNIT/ML ~~LOC~~ SOLN: SUBCUTANEOUS | Status: DC

## 2012-02-18 NOTE — Telephone Encounter (Signed)
rx sent to pharmacy by e-script  

## 2012-02-18 NOTE — Telephone Encounter (Signed)
refill lantus solostar pen inj 5 x 3ml #15 inject 60units subccutaneously every night at bedtime last fill 8.14.13- last ov 6.19.13 25-minute ov

## 2012-02-23 ENCOUNTER — Other Ambulatory Visit: Payer: Self-pay | Admitting: Family Medicine

## 2012-02-23 MED ORDER — GLIMEPIRIDE 4 MG PO TABS: 4.0000 mg | ORAL_TABLET | Freq: Two times a day (BID) | ORAL | Status: DC

## 2012-02-23 NOTE — Telephone Encounter (Signed)
refill Glimipiride 4mg  tablets #180, TK 1 T PO BID last fill 8.7.13-last ov 6.19.13

## 2012-02-23 NOTE — Telephone Encounter (Signed)
rx sent to pharmacy by e-script to last until upcoming OV

## 2012-03-12 ENCOUNTER — Ambulatory Visit (INDEPENDENT_AMBULATORY_CARE_PROVIDER_SITE_OTHER): Payer: PRIVATE HEALTH INSURANCE | Admitting: Family Medicine

## 2012-03-12 ENCOUNTER — Ambulatory Visit: Payer: PRIVATE HEALTH INSURANCE | Admitting: Family Medicine

## 2012-03-12 ENCOUNTER — Encounter: Payer: Self-pay | Admitting: Family Medicine

## 2012-03-12 VITALS — BP 139/82 | HR 104 | Temp 98.0°F | Ht 69.25 in | Wt 364.6 lb

## 2012-03-12 DIAGNOSIS — H9209 Otalgia, unspecified ear: Secondary | ICD-10-CM

## 2012-03-12 MED ORDER — HYDROCODONE-ACETAMINOPHEN 5-500 MG PO TABS: 1.0000 | ORAL_TABLET | Freq: Three times a day (TID) | ORAL | Status: DC | PRN

## 2012-03-12 NOTE — Progress Notes (Signed)
  Subjective:    Patient ID: [name], female    DOB: 06/20/1970, 41 y.o.   MRN: [id number]  HPI Ear pain- R ear pain (pt in tears).  sxs started 8 days ago.  Went to UC on Sunday and was started on Cipro orally and Ciprodex drops.  Hx of severe ear infxns.  Yesterday used ear drops and 'it stopped my ear up and i've been in severe pain ever since'.  No drainage from ear.  No fevers.  Taking 800mg  ibuprofen w/out relief, tylenol w/out relief.   Review of Systems For ROS see HPI     Objective:   Physical Exam  Vitals reviewed. Constitutional: She appears well-developed and well-nourished. She appears distressed (tearful due to pain).       Morbidly obese  HENT:  Head: Normocephalic and atraumatic.       L TM mildly retracted but otherwise normal R TM obscured by cerumen/debris- no pain w/ manipulation of pinna, pain w/ palpation of Tragus No TTP over mastoid  Neck: Normal range of motion. Neck supple.  Lymphadenopathy:    She has cervical adenopathy.          Assessment & Plan:

## 2012-03-12 NOTE — Patient Instructions (Addendum)
We're going to send you to ENT Take the Vicodin as needed for pain Hang in there!!

## 2012-03-12 NOTE — Assessment & Plan Note (Signed)
New.  Severe pain.  Pt unable to tolerate curette and will not attempt irrigation.  Already on abx.  Will refer to ENT.  Pt expressed understanding and is in agreement w/ plan.

## 2012-04-06 ENCOUNTER — Telehealth: Payer: Self-pay | Admitting: Family Medicine

## 2012-04-06 MED ORDER — GLIMEPIRIDE 4 MG PO TABS: 4.0000 mg | ORAL_TABLET | Freq: Two times a day (BID) | ORAL | Status: DC

## 2012-04-06 NOTE — Telephone Encounter (Signed)
Refill: Glimepiride 4 mg tablets. Take 1 tablet by mouth twice daily. Qty 180. Patient is requesting 90 day supply

## 2012-05-04 ENCOUNTER — Telehealth: Payer: Self-pay | Admitting: Family Medicine

## 2012-05-04 DIAGNOSIS — K219 Gastro-esophageal reflux disease without esophagitis: Secondary | ICD-10-CM

## 2012-05-04 MED ORDER — OMEPRAZOLE 40 MG PO CPDR: 40.0000 mg | DELAYED_RELEASE_CAPSULE | Freq: Every day | ORAL | Status: DC

## 2012-05-04 NOTE — Telephone Encounter (Signed)
Rx for Omeprazole sent to pharmacy for refill, pt notified.

## 2012-05-04 NOTE — Telephone Encounter (Signed)
refill Omeprazole 40 MG Take 1 capsule (40 mg total) by mouth daily #30 last fill 11.11.13, last ov 10.18.13 acute

## 2012-05-17 ENCOUNTER — Ambulatory Visit (INDEPENDENT_AMBULATORY_CARE_PROVIDER_SITE_OTHER): Payer: PRIVATE HEALTH INSURANCE | Admitting: Family Medicine

## 2012-05-17 ENCOUNTER — Telehealth: Payer: Self-pay | Admitting: Family Medicine

## 2012-05-17 ENCOUNTER — Encounter: Payer: Self-pay | Admitting: Family Medicine

## 2012-05-17 VITALS — BP 124/70 | HR 115 | Temp 102.4°F | Wt 362.6 lb

## 2012-05-17 DIAGNOSIS — J111 Influenza due to unidentified influenza virus with other respiratory manifestations: Secondary | ICD-10-CM | POA: Insufficient documentation

## 2012-05-17 DIAGNOSIS — R509 Fever, unspecified: Secondary | ICD-10-CM

## 2012-05-17 DIAGNOSIS — I10 Essential (primary) hypertension: Secondary | ICD-10-CM

## 2012-05-17 DIAGNOSIS — R05 Cough: Secondary | ICD-10-CM

## 2012-05-17 MED ORDER — LISINOPRIL 20 MG PO TABS: 20.0000 mg | ORAL_TABLET | Freq: Every day | ORAL | Status: DC

## 2012-05-17 MED ORDER — ALBUTEROL SULFATE HFA 108 (90 BASE) MCG/ACT IN AERS: 2.0000 | INHALATION_SPRAY | RESPIRATORY_TRACT | Status: DC | PRN

## 2012-05-17 MED ORDER — OSELTAMIVIR PHOSPHATE 75 MG PO CAPS: 75.0000 mg | ORAL_CAPSULE | Freq: Two times a day (BID) | ORAL | Status: DC

## 2012-05-17 MED ORDER — GUAIFENESIN-CODEINE 100-10 MG/5ML PO SYRP: 10.0000 mL | ORAL_SOLUTION | Freq: Three times a day (TID) | ORAL | Status: DC | PRN

## 2012-05-17 MED ORDER — AMOXICILLIN 875 MG PO TABS: 875.0000 mg | ORAL_TABLET | Freq: Two times a day (BID) | ORAL | Status: DC

## 2012-05-17 NOTE — Telephone Encounter (Signed)
Refill for lisinopril sent to pharmacy

## 2012-05-17 NOTE — Progress Notes (Signed)
  Subjective:    Patient ID: [name], female    DOB: 26-Sep-1970, 41 y.o.   MRN: [id number]  HPI Flu- sxs started Saturday night, 'hit me out of nowhere'.  + SOB, cough, fevers, body aches.  Cough became productive on the mucinex.  + HA.  Bilateral ear fullness.  Using Qvar and albuterol prn.     Review of Systems For ROS see HPI     Objective:   Physical Exam  Constitutional: She appears well-developed and well-nourished. No distress.  HENT:  Head: Normocephalic and atraumatic.  Right Ear: Tympanic membrane normal.  Left Ear: Tympanic membrane normal.  Nose: Mucosal edema and rhinorrhea present. Right sinus exhibits maxillary sinus tenderness and frontal sinus tenderness. Left sinus exhibits maxillary sinus tenderness and frontal sinus tenderness.  Mouth/Throat: Uvula is midline and mucous membranes are normal. Posterior oropharyngeal erythema present. No oropharyngeal exudate.  Eyes: Conjunctivae normal and EOM are normal. Pupils are equal, round, and reactive to light.  Neck: Normal range of motion. Neck supple.  Cardiovascular: Normal rate, regular rhythm and normal heart sounds.   Pulmonary/Chest: Effort normal and breath sounds normal. No respiratory distress. She has no wheezes.  Lymphadenopathy:    She has no cervical adenopathy.          Assessment & Plan:

## 2012-05-17 NOTE — Assessment & Plan Note (Signed)
New.  Pt w/ + flu test in office and current sinus infxn.  Start abx, tamiflu.  Cough meds prn.  Reviewed supportive care and red flags that should prompt return.  Pt expressed understanding and is in agreement w/ plan.

## 2012-05-17 NOTE — Telephone Encounter (Signed)
refill Lisinopril (Tab) 20 MG Take 1 tablet (20 mg total) by mouth daily #90 last fill 09.26.13

## 2012-05-17 NOTE — Patient Instructions (Addendum)
This is the flu w/ sinus infection combo Start the Tamiflu twice daily x5 days Take the Amox twice daily x10 days- take w/ food REST!!! Cough syrup as needed- this contains mucinex Drink plenty of fluids Hang in there!!! Happy holidays!!!

## 2012-06-17 ENCOUNTER — Telehealth: Payer: Self-pay | Admitting: Family Medicine

## 2012-06-17 DIAGNOSIS — J45909 Unspecified asthma, uncomplicated: Secondary | ICD-10-CM

## 2012-06-17 DIAGNOSIS — I10 Essential (primary) hypertension: Secondary | ICD-10-CM

## 2012-06-17 MED ORDER — CARVEDILOL 25 MG PO TABS: ORAL_TABLET | ORAL | Status: DC

## 2012-06-17 MED ORDER — BECLOMETHASONE DIPROPIONATE 80 MCG/ACT IN AERS: 2.0000 | INHALATION_SPRAY | Freq: Two times a day (BID) | RESPIRATORY_TRACT | Status: DC

## 2012-06-17 NOTE — Telephone Encounter (Signed)
Refills x 2  1-QVAR Oral Inhaler 120 Doses #8.70000000000001, Inhale 2 puffs into the lungs twice daily, last fill 12.22.13  2-Carvedilol 25mg  tablets #180, Take 1 tablet by mouth twice daily last fill 10.21.13

## 2012-06-17 NOTE — Telephone Encounter (Signed)
Refills for Qvar and Coreg sent to [facility] in [place]

## 2012-06-22 ENCOUNTER — Other Ambulatory Visit: Payer: Self-pay | Admitting: *Deleted

## 2012-06-22 DIAGNOSIS — I10 Essential (primary) hypertension: Secondary | ICD-10-CM

## 2012-06-22 MED ORDER — HYDROCHLOROTHIAZIDE 25 MG PO TABS: 25.0000 mg | ORAL_TABLET | Freq: Every day | ORAL | Status: DC

## 2012-06-22 NOTE — Telephone Encounter (Signed)
Refill for HCTZ sent to [facility] on [address]

## 2012-06-25 ENCOUNTER — Telehealth: Payer: Self-pay | Admitting: Family Medicine

## 2012-06-25 MED ORDER — ATORVASTATIN CALCIUM 40 MG PO TABS: 40.0000 mg | ORAL_TABLET | Freq: Every day | ORAL | Status: DC

## 2012-06-25 NOTE — Telephone Encounter (Signed)
Refill done.  

## 2012-06-25 NOTE — Telephone Encounter (Signed)
Refill: Atorvastatin 40 mg tablet. Take 1 tablet by mouth every day. Qty 90. Last fill 03-22-12

## 2012-07-12 ENCOUNTER — Telehealth: Payer: Self-pay | Admitting: Family Medicine

## 2012-07-12 NOTE — Telephone Encounter (Signed)
Refill: Glimepiride 4 mg tablets. Take 1 tablet by mouth twice daily.

## 2012-07-13 MED ORDER — GLIMEPIRIDE 4 MG PO TABS: 4.0000 mg | ORAL_TABLET | Freq: Two times a day (BID) | ORAL | Status: DC

## 2012-07-13 NOTE — Telephone Encounter (Signed)
Rx sent to the pharmacy([facility] [address]) by e-script.//AB/CMA 

## 2012-07-28 ENCOUNTER — Telehealth: Payer: Self-pay | Admitting: Family Medicine

## 2012-07-28 NOTE — Telephone Encounter (Signed)
lantus solostar pen inj 5 X 3 ml Qty: 15 Last refill: 1.3.2014 Inject 60 units everynight at bedtime

## 2012-07-29 ENCOUNTER — Telehealth: Payer: Self-pay | Admitting: Family Medicine

## 2012-07-29 NOTE — Telephone Encounter (Signed)
refill lantus solostar pen inj 5 x #15 Inject 60 units every night at bedtime last fill 1.3.14

## 2012-08-02 MED ORDER — INSULIN GLARGINE 100 UNIT/ML ~~LOC~~ SOLN: SUBCUTANEOUS | Status: DC

## 2012-08-02 NOTE — Telephone Encounter (Signed)
Rx sent to the pharmacy by e-script.//AB/CMA 

## 2012-08-03 ENCOUNTER — Telehealth: Payer: Self-pay | Admitting: Family Medicine

## 2012-08-03 NOTE — Telephone Encounter (Signed)
Spoke with Foye Clock at the pharmacy and she stated that the Lantus rx was taking care of.  Made sure that the Lantus was 60units nightly.//AB/CMA

## 2012-08-03 NOTE — Telephone Encounter (Signed)
Pt is 6 months overdue for diabetes appt.  Needs to schedule OV.  As far as I can tell, Lantus is 60 units nightly

## 2012-08-03 NOTE — Telephone Encounter (Signed)
Caller: Kristin/; Phone: [phone]; Reason for Call: Pharmacy calling on [place] line for [place] office/Dr.  Tabori.  Needs clarification of lantus insulin Rx.  Rx states to "inject 60ml at bedtime.  " Per Epic note 07/29/12 Greggory Brandy and note 08/02/12 Carmel [place] CMA, E-Rx sent to pharmacy for "60units at bedtime.  " Advised Rx for lantus 60u SQ at bedtime; info to office for staff/provider review/clarification/callback.  May reach pharmacist at [phone].  Krs/can

## 2012-08-17 ENCOUNTER — Telehealth: Payer: Self-pay | Admitting: Family Medicine

## 2012-08-17 DIAGNOSIS — I10 Essential (primary) hypertension: Secondary | ICD-10-CM

## 2012-08-17 MED ORDER — LISINOPRIL 20 MG PO TABS: 20.0000 mg | ORAL_TABLET | Freq: Every day | ORAL | Status: DC

## 2012-08-17 NOTE — Telephone Encounter (Signed)
REFILLS ON LISINOPRIL 20 MG TABLETS # 90  SIG: TAKE 1 TABLET BY MOUTH DAILY LAST FILLED 02.25.2014

## 2012-09-09 ENCOUNTER — Telehealth: Payer: Self-pay | Admitting: Family Medicine

## 2012-09-09 DIAGNOSIS — K219 Gastro-esophageal reflux disease without esophagitis: Secondary | ICD-10-CM

## 2012-09-09 NOTE — Telephone Encounter (Signed)
Refill: Omeprazole 40 mg capsules. Take one capsule by mouth daily. Qty 30. Last fill 08-09-12 b-d pen ndl mini 31Gx54mm (3/16) prpl. Use as directed. Qty 100. Last fill 05-13-12

## 2012-09-13 ENCOUNTER — Telehealth: Payer: Self-pay | Admitting: Family Medicine

## 2012-09-13 ENCOUNTER — Encounter: Payer: Self-pay | Admitting: General Practice

## 2012-09-13 ENCOUNTER — Encounter: Payer: Self-pay | Admitting: *Deleted

## 2012-09-13 MED ORDER — GLIMEPIRIDE 4 MG PO TABS: 4.0000 mg | ORAL_TABLET | Freq: Two times a day (BID) | ORAL | Status: DC

## 2012-09-13 MED ORDER — INSULIN PEN NEEDLE 31G X 5 MM MISC: Status: DC

## 2012-09-13 MED ORDER — OMEPRAZOLE 40 MG PO CPDR: 40.0000 mg | DELAYED_RELEASE_CAPSULE | Freq: Every day | ORAL | Status: DC

## 2012-09-13 NOTE — Telephone Encounter (Signed)
Rx sent, Letter Mail  

## 2012-09-13 NOTE — Telephone Encounter (Signed)
Refill: Glimepiride 4 mg tablets. Take 1 tablet by mouth twice daily. Qty 60. Last fill 08-11-12

## 2012-09-13 NOTE — Telephone Encounter (Signed)
Med e-scribed 4/21.

## 2012-09-13 NOTE — Telephone Encounter (Signed)
Refill- bd pen ndl mini 31gx46mm(3/16) prpl. Use as directed. Qty 100. Last fill 12.19.13

## 2012-09-13 NOTE — Telephone Encounter (Signed)
Med filled.  

## 2012-09-30 ENCOUNTER — Other Ambulatory Visit: Payer: Self-pay | Admitting: Family Medicine

## 2012-10-01 NOTE — Telephone Encounter (Signed)
Rx sent to the pharmacy by e-script.//AB/CMA 

## 2012-10-07 ENCOUNTER — Other Ambulatory Visit: Payer: Self-pay | Admitting: Family Medicine

## 2012-10-08 NOTE — Telephone Encounter (Signed)
Rx sent to the pharmacy by e-script.//AB/CMA 

## 2012-10-10 ENCOUNTER — Other Ambulatory Visit: Payer: Self-pay | Admitting: Family Medicine

## 2012-10-11 NOTE — Telephone Encounter (Signed)
Rx sent to the pharmacy by e-script.//AB/CMA 

## 2012-10-18 ENCOUNTER — Other Ambulatory Visit: Payer: Self-pay | Admitting: Family Medicine

## 2012-10-19 NOTE — Telephone Encounter (Signed)
Med filled.  

## 2012-11-14 ENCOUNTER — Other Ambulatory Visit: Payer: Self-pay | Admitting: Internal Medicine

## 2012-11-18 ENCOUNTER — Ambulatory Visit (INDEPENDENT_AMBULATORY_CARE_PROVIDER_SITE_OTHER): Payer: BC Managed Care – PPO | Admitting: Family Medicine

## 2012-11-18 ENCOUNTER — Encounter: Payer: Self-pay | Admitting: Family Medicine

## 2012-11-18 VITALS — BP 110/80 | HR 76 | Temp 98.2°F | Ht 69.0 in | Wt 354.2 lb

## 2012-11-18 DIAGNOSIS — Z0000 Encounter for general adult medical examination without abnormal findings: Secondary | ICD-10-CM

## 2012-11-18 DIAGNOSIS — E109 Type 1 diabetes mellitus without complications: Secondary | ICD-10-CM

## 2012-11-18 LAB — BASIC METABOLIC PANEL
BUN: 13 mg/dL (ref 6–23)
Calcium: 9.5 mg/dL (ref 8.4–10.5)
GFR: 138.11 mL/min (ref >60.00)
Glucose, Bld: 233 mg/dL — ABNORMAL HIGH (ref 70–99)

## 2012-11-18 LAB — HEPATIC FUNCTION PANEL
Albumin: 3.6 g/dL (ref 3.5–5.2)
Total Protein: 7.5 g/dL (ref 6.0–8.3)

## 2012-11-18 LAB — LIPID PANEL
HDL: 62.2 mg/dL (ref >39.00)
Triglycerides: 155 mg/dL — ABNORMAL HIGH (ref 0.0–149.0)

## 2012-11-18 LAB — TSH: TSH: 1.42 u[IU]/mL (ref 0.35–5.50)

## 2012-11-18 LAB — CBC WITH DIFFERENTIAL/PLATELET
Basophils Absolute: 0.1 10*3/uL (ref 0.0–0.1)
Basophils Relative: 0.6 % (ref 0.0–3.0)
Eosinophils Absolute: 0.4 10*3/uL (ref 0.0–0.7)
MCHC: 32.7 g/dL (ref 30.0–36.0)
MCV: 89.4 fl (ref 78.0–100.0)
Monocytes Absolute: 0.5 10*3/uL (ref 0.1–1.0)
Neutrophils Relative %: 61.9 % (ref 43.0–77.0)
RBC: 4.71 Mil/uL (ref 3.87–5.11)
RDW: 14.2 % (ref 11.5–14.6)

## 2012-11-18 LAB — HEMOGLOBIN A1C: Hgb A1c MFr Bld: 11 % — ABNORMAL HIGH (ref 4.6–6.5)

## 2012-11-18 NOTE — Progress Notes (Signed)
  Subjective:    Patient ID: [name], female    DOB: [birth year], 42 y.o.   MRN: [id number]  HPI CPE- UTD on pap, due for mammo.  DM- 'i haven't been controlling it so i've just been avoiding it'.  Last A1C was 1 yr ago.  Sugars are 'high'- running upper 200s, low 300s.  Last week restarted Weight [employer].  Recent CBGs are 225 in AM.  UTD on eye exam.  On Lisinopril for renal protection and BP control.  Has not been taking Victoza.  Currently on metformin, Glimepiride, and Lantus 45 units nightly.   Review of Systems Patient reports no vision/ hearing changes, adenopathy,fever, weight change,  persistant/recurrent hoarseness , swallowing issues, chest pain, palpitations, edema, persistant/recurrent cough, hemoptysis, dyspnea (rest/exertional/paroxysmal nocturnal), gastrointestinal bleeding (melena, rectal bleeding), abdominal pain, significant heartburn, bowel changes, GU symptoms (dysuria, hematuria, incontinence), Gyn symptoms (abnormal  bleeding, pain),  syncope, focal weakness, memory loss, numbness & tingling, skin/hair/nail changes, abnormal bruising or bleeding, anxiety, or depression.     Objective:   Physical Exam General Appearance:    Alert, cooperative, no distress, appears stated age, obese  Head:    Normocephalic, without obvious abnormality, atraumatic  Eyes:    PERRL, conjunctiva/corneas clear, EOM's intact, fundi    benign, both eyes  Ears:    Normal TM's and external ear canals, both ears  Nose:   Nares normal, septum midline, mucosa normal, no drainage    or sinus tenderness  Throat:   Lips, mucosa, and tongue normal; teeth and gums normal  Neck:   Supple, symmetrical, trachea midline, no adenopathy;    Thyroid: no enlargement/tenderness/nodules  Back:     Symmetric, no curvature, ROM normal, no CVA tenderness  Lungs:     Clear to auscultation bilaterally, respirations unlabored  Chest Wall:    No tenderness or deformity   Heart:    Regular rate and rhythm, S1 and  S2 normal, no murmur, rub   or gallop  Breast Exam:    Deferred to GYN  Abdomen:     Soft, non-tender, bowel sounds active all four quadrants,    no masses, no organomegaly  Genitalia:    Deferred to GYN  Rectal:    Extremities:   Extremities normal, atraumatic, no cyanosis or edema  Pulses:   2+ and symmetric all extremities  Skin:   Skin color, texture, turgor normal, no rashes or lesions  Lymph nodes:   Cervical, supraclavicular, and axillary nodes normal  Neurologic:   CNII-XII intact, normal strength, sensation and reflexes    throughout          Assessment & Plan:

## 2012-11-18 NOTE — Patient Instructions (Addendum)
Follow up in 3 months to recheck diabetes Restart the Victoza- continue the Lantus, Metformin, and Glimeperide Continue to work on weight loss We'll notify you of your lab results and make any changes if needed Call with any questions or concerns Happy Bel[birth year] Birthday!!

## 2012-11-21 NOTE — Assessment & Plan Note (Signed)
Pt's PE WNL w/ exception of obesity.  Check labs.  Pt due for mammo.  Anticipatory guidance provided.

## 2012-11-21 NOTE — Assessment & Plan Note (Signed)
Pt has been noncompliant w/ both treatment regimen and scheduled followup.  Pt aware that she has not been doing well controlling diabetes and has been 'avoiding it'.  Again stressed the importance of better control in hopes of avoiding long term complications.  Pt states she is ready to do better.  Will continue to follow.

## 2012-11-22 LAB — VITAMIN D 1,25 DIHYDROXY
Vitamin D 1, 25 (OH)2 Total: 22 pg/mL (ref 18–72)
Vitamin D2 1, 25 (OH)2: <8 pg/mL

## 2012-12-04 ENCOUNTER — Other Ambulatory Visit: Payer: Self-pay | Admitting: Family Medicine

## 2012-12-17 ENCOUNTER — Other Ambulatory Visit: Payer: Self-pay | Admitting: *Deleted

## 2012-12-17 DIAGNOSIS — E119 Type 2 diabetes mellitus without complications: Secondary | ICD-10-CM

## 2012-12-17 MED ORDER — GLUCOSE BLOOD VI STRP: ORAL_STRIP | Status: DC

## 2012-12-17 NOTE — Telephone Encounter (Signed)
Refill for one touch ultra blue test strips sent to [facility]

## 2012-12-20 ENCOUNTER — Other Ambulatory Visit: Payer: Self-pay | Admitting: Family Medicine

## 2012-12-22 ENCOUNTER — Other Ambulatory Visit: Payer: Self-pay | Admitting: *Deleted

## 2012-12-24 NOTE — Telephone Encounter (Signed)
Rx's sent to the pharmacy by e-script.//AB/CMA 

## 2013-01-06 ENCOUNTER — Ambulatory Visit (INDEPENDENT_AMBULATORY_CARE_PROVIDER_SITE_OTHER): Payer: BC Managed Care – PPO | Admitting: Internal Medicine

## 2013-01-06 ENCOUNTER — Encounter: Payer: Self-pay | Admitting: Internal Medicine

## 2013-01-06 VITALS — BP 128/80 | HR 72 | Temp 98.4°F | Wt 345.0 lb

## 2013-01-06 DIAGNOSIS — J209 Acute bronchitis, unspecified: Secondary | ICD-10-CM

## 2013-01-06 MED ORDER — AZITHROMYCIN 250 MG PO TABS: ORAL_TABLET | ORAL | Status: DC

## 2013-01-06 NOTE — Patient Instructions (Addendum)

## 2013-01-06 NOTE — Progress Notes (Signed)
  Subjective:    Patient ID: [name], female    DOB: [birth year], 42 y.o.   MRN: [id number]  HPI   Symptoms began 01/04/13 is soreness in the posterior nasopharyngeal area. As of 8/13 she developed myalgias with some chills. She's also noted some sweating.  She was exposed to her husband who has had a similar illness but who is now on antibiotics  She describes associated frontal and facial pain without nasal purulence. She does have earache and dental pain  Cough is productive of yellow sputum and is associated with some wheezing and shortness of breath.  She has never smoked. She denies a history of asthma but apparently has been treated for reactive airways disease in the past and she has Qvar and albuterol  The Mucinex  severe cold has been of some benefit    Review of Systems  She denies extrinsic symptoms of itchy, watery eyes, sneezing.     Objective:   Physical Exam General appearance:;well nourished; no acute distress or increased work of breathing is present.  Weigh excess.No  lymphadenopathy about the head, neck, or axilla noted.   Eyes: No conjunctival inflammation or lid edema is present.   Ears:  External ear exam shows no significant lesions or deformities.  Otoscopic examination reveals clear canals, tympanic membranes are intact bilaterally without bulging, retraction, inflammation or discharge.  Nose:  External nasal examination shows no deformity or inflammation. Nasal mucosa are pink and moist without lesions or exudates. No septal dislocation or deviation.No obstruction to airflow.   Oral exam: Dental hygiene is good; lips and gums are healthy appearing.There is minor oropharyngeal erythema or exudate noted.   Neck:  No deformities, thyromegaly, masses, or tenderness noted.   Supple with full range of motion without pain.   Heart:  Normal rate and regular rhythm. S1 and S2 normal without gallop, murmur, click, rub or other extra sounds.  S4  Lungs:Chest  clear to auscultation; no wheezes, rhonchi,rales ,or rubs present.No increased work of breathing.    Extremities:  No cyanosis, edema, or clubbing  noted    Skin: Warm & dry          Assessment & Plan:  #1 acute bronchitis w/o bronchospasm #2 URI, acute Plan: See orders and recommendations

## 2013-01-10 ENCOUNTER — Other Ambulatory Visit: Payer: Self-pay | Admitting: Family Medicine

## 2013-01-12 NOTE — Telephone Encounter (Signed)
Med filled.  

## 2013-02-03 ENCOUNTER — Encounter: Payer: Self-pay | Admitting: Family Medicine

## 2013-02-03 ENCOUNTER — Ambulatory Visit (INDEPENDENT_AMBULATORY_CARE_PROVIDER_SITE_OTHER): Payer: BC Managed Care – PPO | Admitting: Family Medicine

## 2013-02-03 VITALS — BP 122/94 | HR 72 | Temp 98.1°F | Resp 16 | Wt 342.2 lb

## 2013-02-03 DIAGNOSIS — I951 Orthostatic hypotension: Secondary | ICD-10-CM

## 2013-02-03 DIAGNOSIS — J0100 Acute maxillary sinusitis, unspecified: Secondary | ICD-10-CM

## 2013-02-03 DIAGNOSIS — H8110 Benign paroxysmal vertigo, unspecified ear: Secondary | ICD-10-CM | POA: Insufficient documentation

## 2013-02-03 MED ORDER — AMOXICILLIN 875 MG PO TABS: 875.0000 mg | ORAL_TABLET | Freq: Two times a day (BID) | ORAL | Status: DC

## 2013-02-03 MED ORDER — MECLIZINE HCL 50 MG PO TABS: 50.0000 mg | ORAL_TABLET | Freq: Three times a day (TID) | ORAL | Status: DC | PRN

## 2013-02-03 NOTE — Assessment & Plan Note (Signed)
New.  Start abx.  Reviewed supportive care and red flags that should prompt return.  Pt expressed understanding and is in agreement w/ plan.  

## 2013-02-03 NOTE — Assessment & Plan Note (Signed)
New.  Pt's BP dropped slightly w/ standing but pulse jumped by 26.  Encouraged increased fluids, changing positions slowly, and allowing herself time to adjust.  Reviewed supportive care and red flags that should prompt return.  Pt expressed understanding and is in agreement w/ plan.

## 2013-02-03 NOTE — Progress Notes (Signed)
  Subjective:    Patient ID: [name], female    DOB: Aug 21, 1970, 42 y.o.   MRN: [id number]  HPI Dizziness- sxs started 3 weeks ago after URI.  Most notable w/ position changes but will have vertigo when turning head quickly- particularly when lying down or reclined in the chair.  No nausea.  Having ear fullness.  Dizziness improves after 5-10 seconds in new position.  No hx of similar.  Reports good fluid intake.  + HA, sinus pressure.  Denies nasal congestion.   Review of Systems For ROS see HPI     Objective:   Physical Exam  Vitals reviewed. Constitutional: She is oriented to person, place, and time. She appears well-developed and well-nourished. No distress.  HENT:  Head: Normocephalic and atraumatic.  Right Ear: Tympanic membrane normal.  Left Ear: Tympanic membrane normal.  Nose: Mucosal edema and rhinorrhea present. Right sinus exhibits maxillary sinus tenderness. Right sinus exhibits no frontal sinus tenderness. Left sinus exhibits maxillary sinus tenderness. Left sinus exhibits no frontal sinus tenderness.  Mouth/Throat: Uvula is midline and mucous membranes are normal. Posterior oropharyngeal erythema present. No oropharyngeal exudate.  Eyes: Conjunctivae and EOM are normal. Pupils are equal, round, and reactive to light.  Neck: Normal range of motion. Neck supple.  Cardiovascular: Normal rate, regular rhythm and normal heart sounds.   Pulmonary/Chest: Effort normal and breath sounds normal. No respiratory distress. She has no wheezes.  Lymphadenopathy:    She has no cervical adenopathy.  Neurological: She is alert and oriented to person, place, and time.  Skin: Skin is warm and dry.  Psychiatric: She has a normal mood and affect. Her behavior is normal.          Assessment & Plan:

## 2013-02-03 NOTE — Assessment & Plan Note (Signed)
New.  Likely due to pt's current maxillary sinus infxn.  Start abx.  Meclizine prn.  Reviewed supportive care and red flags that should prompt return.  Pt expressed understanding and is in agreement w/ plan.

## 2013-02-03 NOTE — Patient Instructions (Addendum)
You have 2 things going on- sinus infection causing vertigo and orthostasis (low blood pressure w/ position change) Start the Amoxicillin twice daily- take w/ food Use the Meclizine as needed for the spins Drink plenty of fluids Hang in there!!!

## 2013-02-10 ENCOUNTER — Other Ambulatory Visit: Payer: Self-pay | Admitting: Family Medicine

## 2013-02-11 NOTE — Telephone Encounter (Signed)
Rx filled and was reminded to schedule an office visit. SW, CMA

## 2013-03-12 ENCOUNTER — Other Ambulatory Visit: Payer: Self-pay | Admitting: Family Medicine

## 2013-03-14 NOTE — Telephone Encounter (Signed)
Med filled and letter mailed to pt to inform need for follow up appt in December for Diabetes and cholesterol.

## 2013-04-05 ENCOUNTER — Other Ambulatory Visit: Payer: Self-pay

## 2013-04-05 DIAGNOSIS — Z1231 Encounter for screening mammogram for malignant neoplasm of breast: Secondary | ICD-10-CM

## 2013-04-12 ENCOUNTER — Ambulatory Visit (INDEPENDENT_AMBULATORY_CARE_PROVIDER_SITE_OTHER): Payer: BC Managed Care – PPO | Admitting: Family Medicine

## 2013-04-12 ENCOUNTER — Encounter: Payer: Self-pay | Admitting: Family Medicine

## 2013-04-12 VITALS — BP 126/86 | HR 73 | Temp 98.1°F | Resp 16 | Wt 348.5 lb

## 2013-04-12 DIAGNOSIS — K219 Gastro-esophageal reflux disease without esophagitis: Secondary | ICD-10-CM

## 2013-04-12 DIAGNOSIS — R079 Chest pain, unspecified: Secondary | ICD-10-CM | POA: Insufficient documentation

## 2013-04-12 LAB — CBC WITH DIFFERENTIAL/PLATELET
Eosinophils Relative: 6 % — ABNORMAL HIGH (ref 0.0–5.0)
HCT: 39.5 % (ref 36.0–46.0)
Hemoglobin: 13.2 g/dL (ref 12.0–15.0)
Lymphocytes Relative: 26.1 % (ref 12.0–46.0)
Lymphs Abs: 2.5 10*3/uL (ref 0.7–4.0)
MCHC: 33.4 g/dL (ref 30.0–36.0)
Monocytes Relative: 5.6 % (ref 3.0–12.0)
Neutrophils Relative %: 61.8 % (ref 43.0–77.0)
Platelets: 270 10*3/uL (ref 150.0–400.0)
WBC: 9.7 10*3/uL (ref 4.5–10.5)

## 2013-04-12 LAB — TSH: TSH: 1.52 u[IU]/mL (ref 0.35–5.50)

## 2013-04-12 LAB — HEPATIC FUNCTION PANEL
ALT: 30 U/L (ref 0–35)
AST: 36 U/L (ref 0–37)
Albumin: 3.4 g/dL — ABNORMAL LOW (ref 3.5–5.2)
Alkaline Phosphatase: 65 U/L (ref 39–117)
Total Bilirubin: 0.4 mg/dL (ref 0.3–1.2)

## 2013-04-12 LAB — BASIC METABOLIC PANEL
Calcium: 9.1 mg/dL (ref 8.4–10.5)
Chloride: 96 mEq/L (ref 96–112)
Creatinine, Ser: 0.6 mg/dL (ref 0.4–1.2)
GFR: 123.99 mL/min (ref >60.00)
Glucose, Bld: 180 mg/dL — ABNORMAL HIGH (ref 70–99)
Potassium: 4.3 mEq/L (ref 3.5–5.1)

## 2013-04-12 LAB — LIPID PANEL
Cholesterol: 193 mg/dL (ref 0–200)
Total CHOL/HDL Ratio: 3
VLDL: 43.2 mg/dL — ABNORMAL HIGH (ref 0.0–40.0)

## 2013-04-12 LAB — LDL CHOLESTEROL, DIRECT: Direct LDL: 112.6 mg/dL

## 2013-04-12 LAB — HEMOGLOBIN A1C: Hgb A1c MFr Bld: 9.6 % — ABNORMAL HIGH (ref 4.6–6.5)

## 2013-04-12 MED ORDER — GI COCKTAIL ~~LOC~~
30.0000 mL | Freq: Once | ORAL | Status: AC
  Administered 2013-04-12: 30 mL via ORAL

## 2013-04-12 NOTE — Patient Instructions (Signed)
Follow up in 3 months to recheck diabetes Go to Cardiology tomorrow for evaluation We'll notify you of your lab results and make any changes if needed Call with any questions or concerns If severe or worsening pain- go back to the ER Hang in there!!

## 2013-04-12 NOTE — Progress Notes (Signed)
  Subjective:    Patient ID: [name], female    DOB: Jan 16, 1971, 42 y.o.   MRN: [id number]  HPI Pre visit review using our clinic review tool, if applicable. No additional management support is needed unless otherwise documented below in the visit note.  ER F/U- pt was having CP that was radiating all the way through to the back.  Had normal cardiac w/u and CT angiogram on 11/17 at [facility].  Did not have CP this AM when she woke, but did develop some chest pain w/ exertion in the parking lot at school.  Has seen Dr Myrtis Ser previously for tachycardia.  Pt denies current reflux sxs and feels that sxs have been even better than before.  Increased gas and belching recently.  Pt reports current pain is 'very mild'- substernal w/ some associated back pain.  DM- did not follow up as directed, admits to not using Victoza regularly until 3 weeks ago.  Just re-committed to Weight Watchers after mother had 3 stents placed.  Rejoined the gym.  Admits to poor compliance previously but wants 'to do better'.   Review of Systems For ROS see HPI     Objective:   Physical Exam  Vitals reviewed. Constitutional: She is oriented to person, place, and time. She appears well-developed and well-nourished. No distress.  Morbidly obese  Cardiovascular: Normal rate, regular rhythm, normal heart sounds and intact distal pulses.   Pulmonary/Chest: Effort normal and breath sounds normal. No respiratory distress. She has no wheezes. She has no rales. She exhibits no tenderness.  Abdominal: Soft. Bowel sounds are normal. She exhibits no distension. There is no tenderness. There is no rebound.  Musculoskeletal: She exhibits no edema.  Neurological: She is alert and oriented to person, place, and time.  Skin: Skin is warm and dry.  Psychiatric: She has a normal mood and affect. Her behavior is normal. Thought content normal.          Assessment & Plan:

## 2013-04-12 NOTE — Assessment & Plan Note (Signed)
Chronic problem.  Pt thought sxs were better controlled w/ taking PPI 30 min prior to eating but recently has had increased gas and belching.  Minimal relief w/ GI cocktail in office.  Will monitor for now but may need additional GI w/u.  Pt expressed understanding and is in agreement w/ plan.

## 2013-04-12 NOTE — Assessment & Plan Note (Signed)
Chronic problem.  Uncontrolled.  Pt does not follow up as directed, doesn't take meds, follow low carb diet or get regular exercise.  Reports that mom's cardiac stenting 3 weeks ago was 'eye opening'.  Has committed to doing better.  Applauded this decision b/c pt is at high risk based on her current lifestyle.  Will follow closely.

## 2013-04-12 NOTE — Assessment & Plan Note (Signed)
New.  Atypical.  Reviewed ER labs, notes, and imaging.  Negative troponin x3.  Negative CT angio- no PE or mention of aneurysm.  No TTP over chest wall.  Some GI component w/ increased gas and belching.  Mild relief w/ GI cocktail in office but not sufficient to r/o cardiac etiology.  + family hx and multiple risk factors- uncontrolled diabetes, morbid obesity, HTN, hyperlipidemia.  Pt to see cards tomorrow.  Reviewed red flags that should prompt immediate ER evaluation.  Pt expressed understanding and is in agreement w/ plan.

## 2013-04-13 ENCOUNTER — Other Ambulatory Visit: Payer: Self-pay | Admitting: Family Medicine

## 2013-04-13 ENCOUNTER — Encounter: Payer: Self-pay | Admitting: Cardiology

## 2013-04-13 ENCOUNTER — Ambulatory Visit (INDEPENDENT_AMBULATORY_CARE_PROVIDER_SITE_OTHER): Payer: BC Managed Care – PPO | Admitting: Cardiology

## 2013-04-13 VITALS — BP 130/80 | HR 70 | Ht 69.0 in | Wt 344.0 lb

## 2013-04-13 DIAGNOSIS — I498 Other specified cardiac arrhythmias: Secondary | ICD-10-CM

## 2013-04-13 DIAGNOSIS — R0602 Shortness of breath: Secondary | ICD-10-CM

## 2013-04-13 DIAGNOSIS — I10 Essential (primary) hypertension: Secondary | ICD-10-CM

## 2013-04-13 DIAGNOSIS — G4733 Obstructive sleep apnea (adult) (pediatric): Secondary | ICD-10-CM

## 2013-04-13 DIAGNOSIS — R7981 Abnormal blood-gas level: Secondary | ICD-10-CM

## 2013-04-13 DIAGNOSIS — R079 Chest pain, unspecified: Secondary | ICD-10-CM

## 2013-04-13 DIAGNOSIS — E782 Mixed hyperlipidemia: Secondary | ICD-10-CM

## 2013-04-13 NOTE — Telephone Encounter (Signed)
Med filled.  

## 2013-04-13 NOTE — Patient Instructions (Signed)
Your physician recommends that you continue on your current medications as directed. Please refer to the Current Medication list given to you today.  Your physician has requested that you have an exercise tolerance test. For further information please visit [email or web address]. Please also follow instruction sheet, as given.  Follow up as needed  

## 2013-04-13 NOTE — Progress Notes (Signed)
[address] [place], [place]  [address] Phone: [phone] Fax:  [phone]  Date:  04/13/2013   ID:  [name], DOB May 09, 1971, MRN [id number]  PCP:  Neena Rhymes, MD   History of Present Illness: [name] is a 42 y.o. female here for the evaluation of chest pain. Recently had emergency department visit chest pain radiating to the back. She had a normal cardiac workup and CT angiogram on 11/17 at [facility]. Yesterday she felt some chest pain with exertion in the parking lot while at school., [employer].  Previously Dr. Myrtis Ser had seen her for tachycardia greater than 3 years ago. She has had some increased belching.  Pain is not as bad as it was on Monday. Upper back is not as bad. Lasts longer. CP can come on when sitting. When getting hungry, chest pain gets worse and feels deep. On Monday noted the burping. Has GERD. She knows when it acts up. Although she notes some discomfort when exerting.   I personally reviewed [facility] emergency department records. Troponin was normal. Potassium was 4.2, creatinine 0.48. Liver functions were normal. CT scan of chest showed no evidence of pulmonary embolism. Fatty infiltration of the liver was noted. No mention of coronary calcification or aortic calcification. I personally reviewed CD copy of CT scan.  Father has SLE, leaky heart valve. Mother - CAD, 3 stents 7/14.  Wt Readings from Last 3 Encounters:  04/13/13 344 lb (156.037 kg)  04/12/13 348 lb 8 oz (158.079 kg)  02/03/13 342 lb 4 oz (155.244 kg)     Past Medical History  Diagnosis Date  . Asthma     pt stated treated as ashtma but not really asthma  . Chicken pox   . Depression   . Diabetes mellitus   . GERD (gastroesophageal reflux disease)   . Allergy   . Hypertension     readings  . Hyperlipidemia   . Migraine   . UTI (urinary tract infection)   . Irregular heartbeat   . Sleep apnea     Past Surgical History  Procedure  Laterality Date  . Cesarean section      1995  . Thyroidectomy, partial  2001    removed left  . Abdominal hysterectomy      2009  . Back surgery  1999  . Navel repair  2003  . Ankle surgery  1989    left    Current Outpatient Prescriptions  Medication Sig Dispense Refill  . albuterol (PROVENTIL HFA;VENTOLIN HFA) 108 (90 BASE) MCG/ACT inhaler Inhale 2 puffs into the lungs every 4 (four) hours as needed for wheezing or shortness of breath.  1 Inhaler  3  . aspirin 81 MG tablet Take 160 mg by mouth daily.      [name] atorvastatin (LIPITOR) 40 MG tablet TAKE 1 TABLET BY MOUTH DAILY  90 tablet  1  . Calcium Carbonate-Vitamin D (CALTRATE 600+D PO) Take 1 tablet by mouth 2 (two) times daily.      . carbamazepine (TEGRETOL XR) 200 MG 12 hr tablet Take 200 mg by mouth 2 (two) times daily.      . carvedilol (COREG) 25 MG tablet TAKE 1 TABLET BY MOUTH TWICE DAILY  180 tablet  1  . cholecalciferol (VITAMIN D) 1000 UNITS tablet Take 2,000 Units by mouth daily.      . diflorasone-emollient (APEXICON E) 0.05 % CREA Apply 1 application topically 2 (two)  times daily.      [name] estradiol (ESTRACE) 1 MG tablet Take 1 mg by mouth 2 (two) times daily.      . fluticasone (FLONASE) 50 MCG/ACT nasal spray Place 2 sprays into the nose daily.  16 g  2  . gabapentin (NEURONTIN) 600 MG tablet Take 600 mg by mouth 3 (three) times daily.      [name] glimepiride (AMARYL) 4 MG tablet TAKE 1 TABLET BY MOUTH TWICE DAILY  60 tablet  0  . hydrochlorothiazide (HYDRODIURIL) 25 MG tablet Take 1 tablet (25 mg total) by mouth daily.  90 tablet  3  . hydrOXYzine (ATARAX/VISTARIL) 50 MG tablet Take 50 mg by mouth every 4 (four) hours as needed. Up to four pills daily.      . hydrOXYzine (VISTARIL) 50 MG capsule Take 50 mg by mouth every 4 (four) hours as needed.      . Insulin Glargine (LANTUS SOLOSTAR) 100 UNIT/ML SOPN Inject 45 Units into the skin at bedtime.       . lamoTRIgine (LAMICTAL) 200 MG tablet Take 200 mg by mouth 2 (two) times  daily.      . Liraglutide (VICTOZA) 18 MG/3ML SOPN INJECT 1.8 MLS (1.2 MG) UNDER THE SKIN DAILY      . lisinopril (PRINIVIL,ZESTRIL) 20 MG tablet TAKE 1 TABLET BY MOUTH DAILY  90 tablet  0  . meclizine (ANTIVERT) 50 MG tablet Take 1 tablet (50 mg total) by mouth 3 (three) times daily as needed.  30 tablet  0  . metFORMIN (GLUCOPHAGE) 1000 MG tablet TAKE 1 TABLET BY MOUTH TWICE DAILY WITH A MEAL  60 tablet  0  . mometasone (ELOCON) 0.1 % cream Apply 1 application topically as needed.      [name] omeprazole (PRILOSEC) 40 MG capsule TAKE ONE CAPSULE BY MOUTH DAILY  30 capsule  2  . QVAR 80 MCG/ACT inhaler INHALE 2 PUFFS INTO THE LUNGS TWICE DAILY  8.7 g  5  . tiaGABine (GABITRIL) 4 MG tablet Take 8 mg by mouth 2 (two) times daily.      . traMADol (ULTRAM) 50 MG tablet Take 50 mg by mouth every 6 (six) hours as needed.      [name] glucose blood test strip One touch ultra blue test strips  100 each  5  . Insulin Pen Needle 31G X 5 MM MISC USE AS DIRECTED  100 each  3   No current facility-administered medications for this visit.    Allergies:   No Known Allergies  Social History:  The patient  reports that she has never smoked. She does not have any smokeless tobacco history on file. She reports that she does not drink alcohol or use illicit drugs.   Family History  Problem Relation Age of Onset  . Hyperlipidemia Mother   . Mental illness Mother   . Diabetes Mother   . Heart disease Mother   . Early death Mother   . Cancer Father   . Hypertension Father   . Stroke Maternal Aunt   . Stroke Paternal Grandfather   . Mental illness Paternal Grandfather     ROS:  Please see the history of present illness.   Denies syncope, bleeding, orthopnea, PND, rash.   All other systems reviewed and negative.   PHYSICAL EXAM: VS:  BP 130/80  Pulse 70  Ht 5\' 9"  (1.753 m)  Wt 344 lb (156.037 kg)  BMI 50.78 kg/m2 Well nourished, well developed, in no acute distress HEENT: normal,  [place]/AT, EOMI Neck: no JVD,  normal carotid upstroke, no bruit Cardiac:  normal S1, S2; RRR; no murmur Lungs:  clear to auscultation bilaterally, no wheezing, rhonchi or rales Abd: soft, nontender, no hepatomegaly, no bruitsObese Ext: no edema, 2+ distal pulses Skin: warm and dry GU: deferred Neuro: no focal abnormalities noted, AAO x 3  EKG:  Previously normal sinus rhythm without any other changes     ASSESSMENT AND PLAN:  1. Chest pain-troponins normal. CT scan excluded pulmonary embolism. No evidence of coronary artery calcification. Has both atypical as well as exertional component. She is concerned given her strong family history, mother for instance with 3 recent stents. She has diabetes, morbid obesity, obstructive sleep apnea, hyperlipidemia. Because of her symptoms, I will pursue exercise treadmill test for further risk evaluation. We had lengthy conversation about overall primary prevention with weight loss, compliance with obstructive sleep apnea/CPAP. She has a membership to [employer] as well as [employer]. Her son, when he went to college, lost 45 pounds. Motivation. Out of everything, exercise, weight loss will be key for her further cardiovascular health. If exercise treadmill overall reassuring, certainly her symptoms may have been related to GERD or perhaps musculoskeletal. 2. Diabetes-per primary team 3. Obstructive sleep apnea-CPAP, urged compliance 4. Morbid obesity-encouraged weight loss. 5. Strong family history of CAD-mother  Signed, Donato Schultz, MD [facility]  04/13/2013 2:38 PM

## 2013-04-14 ENCOUNTER — Other Ambulatory Visit: Payer: Self-pay | Admitting: General Practice

## 2013-04-14 MED ORDER — FENOFIBRATE 160 MG PO TABS: 160.0000 mg | ORAL_TABLET | Freq: Every day | ORAL | Status: DC

## 2013-04-27 ENCOUNTER — Other Ambulatory Visit: Payer: Self-pay | Admitting: Family Medicine

## 2013-04-27 ENCOUNTER — Other Ambulatory Visit (INDEPENDENT_AMBULATORY_CARE_PROVIDER_SITE_OTHER): Payer: BC Managed Care – PPO

## 2013-04-27 DIAGNOSIS — R7981 Abnormal blood-gas level: Secondary | ICD-10-CM

## 2013-04-27 LAB — BASIC METABOLIC PANEL WITH GFR
BUN: 16 mg/dL (ref 6–23)
CO2: 31 meq/L (ref 19–32)
Calcium: 9.1 mg/dL (ref 8.4–10.5)
Chloride: 98 meq/L (ref 96–112)
Creatinine, Ser: 0.6 mg/dL (ref 0.4–1.2)
GFR: 108.45 mL/min
Glucose, Bld: 206 mg/dL — ABNORMAL HIGH (ref 70–99)
Potassium: 4.6 meq/L (ref 3.5–5.1)
Sodium: 137 meq/L (ref 135–145)

## 2013-04-28 ENCOUNTER — Other Ambulatory Visit: Payer: Self-pay | Admitting: Nurse Practitioner

## 2013-04-28 NOTE — Telephone Encounter (Signed)
Patient scheduled AEX for 06/17/13 with PG. Also, has MMG scheduled for 05/06/13.

## 2013-04-28 NOTE — Telephone Encounter (Signed)
Med filled.  

## 2013-05-03 ENCOUNTER — Ambulatory Visit (HOSPITAL_COMMUNITY)
Admission: RE | Admit: 2013-05-03 | Discharge: 2013-05-03 | Disposition: A | Discharge status: Home / Self Care | Payer: BC Managed Care – PPO | Source: Ambulatory Visit | Attending: Cardiology | Admitting: Cardiology

## 2013-05-03 DIAGNOSIS — R0602 Shortness of breath: Secondary | ICD-10-CM | POA: Insufficient documentation

## 2013-05-03 DIAGNOSIS — R5381 Other malaise: Secondary | ICD-10-CM | POA: Insufficient documentation

## 2013-05-03 DIAGNOSIS — R079 Chest pain, unspecified: Secondary | ICD-10-CM | POA: Insufficient documentation

## 2013-05-06 ENCOUNTER — Ambulatory Visit
Admission: RE | Admit: 2013-05-06 | Discharge: 2013-05-06 | Disposition: A | Discharge status: Home / Self Care | Payer: BC Managed Care – PPO | Source: Ambulatory Visit

## 2013-05-06 DIAGNOSIS — Z1231 Encounter for screening mammogram for malignant neoplasm of breast: Secondary | ICD-10-CM

## 2013-05-10 ENCOUNTER — Telehealth: Payer: Self-pay | Admitting: Nurse Practitioner

## 2013-05-10 NOTE — Telephone Encounter (Signed)
Message left to return call to [place] at [phone].   Patient will need appointment for Breast Check. Please schedule and advise to bring copies of ER visit.

## 2013-05-10 NOTE — Telephone Encounter (Signed)
Return call to patient. Advised OV needed for breast check.  Scheduled for 05-11-13 with Dr Hyacinth Meeker and instructed to bring records. Has AEX with Patty in [place] but advised should have breast check now and can follow-up at AEX.  Routing to provider for final review. Patient agreeable to disposition. Will close encounter

## 2013-05-10 NOTE — Telephone Encounter (Signed)
Pt needs an order faxed to the breast center for a diagnostic mammogram because a spot was found in her right breast by cat scan.

## 2013-05-10 NOTE — Telephone Encounter (Signed)
Patient is returning Tracy's call.

## 2013-05-11 ENCOUNTER — Other Ambulatory Visit: Payer: Self-pay | Admitting: Obstetrics & Gynecology

## 2013-05-11 ENCOUNTER — Ambulatory Visit (INDEPENDENT_AMBULATORY_CARE_PROVIDER_SITE_OTHER): Payer: BC Managed Care – PPO | Admitting: Obstetrics & Gynecology

## 2013-05-11 VITALS — BP 116/80 | HR 80 | Resp 14 | Ht 68.75 in | Wt 349.0 lb

## 2013-05-11 DIAGNOSIS — N6310 Unspecified lump in the right breast, unspecified quadrant: Secondary | ICD-10-CM

## 2013-05-11 DIAGNOSIS — N63 Unspecified lump in unspecified breast: Secondary | ICD-10-CM

## 2013-05-11 DIAGNOSIS — Z1239 Encounter for other screening for malignant neoplasm of breast: Secondary | ICD-10-CM

## 2013-05-11 NOTE — Progress Notes (Signed)
Scheduled for bilateral diagnostic mammogram and R breast U/S [facility] imaging for 12/24 at 1015. Patient agreeable to time/date/location.

## 2013-05-12 ENCOUNTER — Encounter: Payer: Self-pay | Admitting: Obstetrics & Gynecology

## 2013-05-12 NOTE — Progress Notes (Signed)
Subjective:     Patient ID: [name], female   DOB: 21-Nov-1970, 42 y.o.   MRN: [id number]  HPI 42 yo MWF with recent hx of chest CT due to chest pain.  She had full ER work-up and has seen PCP as well.  An 11 mm lesion on right breast in CT noted.  Pt cannot feel mass.  Was told needed follow up.  She scheduled her own MMG and when she went to get it was advised needed to see [place] first.  No recent trauma.  No skin changes.  No nipple discharge.  Somewhat worried.  H/o robotic TLH/BSO and on HRT since.  Worried about breast cancer risk with HRT.  Discussed with her implications of early ovary removal and typical recommendations of HRT use now and how appropriate that is.  Voices understanding of this.  Review of Systems  All other systems reviewed and are negative.       Objective:   Physical Exam  Constitutional: She appears well-developed and well-nourished.  Neck: Normal range of motion. Neck supple. No thyromegaly present.  Cardiovascular: Normal rate and regular rhythm.   Pulmonary/Chest: Effort normal and breath sounds normal. Right breast exhibits no inverted nipple, no mass, no nipple discharge, no skin change and no tenderness. Left breast exhibits no inverted nipple, no mass, no nipple discharge, no skin change and no tenderness. Breasts are symmetrical.  Lymphadenopathy:    She has no cervical adenopathy.       Assessment:     Breast mass noted on CT, no physical exam findings     Plan:     Pt will need to having diagnostic imaging.  Have reassured pt that CT is not a good way to image the breast.  All questions answered.

## 2013-05-13 ENCOUNTER — Other Ambulatory Visit: Payer: Self-pay | Admitting: Family Medicine

## 2013-05-13 NOTE — Telephone Encounter (Signed)
Med filled.  

## 2013-05-16 ENCOUNTER — Telehealth: Payer: Self-pay | Admitting: Cardiology

## 2013-05-16 NOTE — Telephone Encounter (Signed)
New Message//Follow Up  Pt calling for stress test results// please call

## 2013-05-18 ENCOUNTER — Ambulatory Visit
Admission: RE | Admit: 2013-05-18 | Discharge: 2013-05-18 | Disposition: A | Discharge status: Home / Self Care | Payer: BC Managed Care – PPO | Source: Ambulatory Visit | Attending: Obstetrics & Gynecology | Admitting: Obstetrics & Gynecology

## 2013-05-18 ENCOUNTER — Telehealth: Payer: Self-pay | Admitting: Family Medicine

## 2013-05-18 DIAGNOSIS — N6310 Unspecified lump in the right breast, unspecified quadrant: Secondary | ICD-10-CM

## 2013-05-18 NOTE — Telephone Encounter (Signed)
Do they want treatment for pain or inflammation.  Inflammation can do ibuprofen 800mg  TID, pain can have tramadol 50mg  TID prn, #30 (assuming no allergies)

## 2013-05-18 NOTE — Telephone Encounter (Signed)
Called and spoke with Waynetta Sandy at the dentist office. Advised on Dr. Rennis Golden recommendations, also advised to make sure pt is not allergic to tramadol.

## 2013-05-18 NOTE — Telephone Encounter (Signed)
Beth from Dr. [facility] is calling in regards to the post-op inflammation the patient is experiencing. Patient had a root canal with them on Dec 1st and when they saw her yesterday she had a lot of inflammation and Motrin is not helping. Since the patient is a diabetic they are seeking a recommendation from Dr. Beverely Low about what they can rx for the patient. Please advise.

## 2013-05-24 NOTE — Telephone Encounter (Signed)
Follow Up  2nd message  Pt called for stress test results//SR

## 2013-05-30 NOTE — Telephone Encounter (Signed)
Advised patient that test was overall reassuring. Skains reviewed Dr. Michel HarrowKelley's note

## 2013-06-01 ENCOUNTER — Ambulatory Visit (INDEPENDENT_AMBULATORY_CARE_PROVIDER_SITE_OTHER): Payer: BC Managed Care – PPO | Admitting: Internal Medicine

## 2013-06-01 ENCOUNTER — Encounter: Payer: Self-pay | Admitting: Internal Medicine

## 2013-06-01 VITALS — BP 135/81 | HR 87 | Temp 98.0°F | Wt 340.0 lb

## 2013-06-01 DIAGNOSIS — H6692 Otitis media, unspecified, left ear: Secondary | ICD-10-CM

## 2013-06-01 DIAGNOSIS — H6690 Otitis media, unspecified, unspecified ear: Secondary | ICD-10-CM

## 2013-06-01 DIAGNOSIS — L259 Unspecified contact dermatitis, unspecified cause: Secondary | ICD-10-CM

## 2013-06-01 DIAGNOSIS — L309 Dermatitis, unspecified: Secondary | ICD-10-CM

## 2013-06-01 MED ORDER — HYDROCORTISONE 2.5 % EX CREA: TOPICAL_CREAM | Freq: Two times a day (BID) | CUTANEOUS | Status: DC

## 2013-06-01 MED ORDER — DOXYCYCLINE HYCLATE 100 MG PO TABS: 100.0000 mg | ORAL_TABLET | Freq: Two times a day (BID) | ORAL | Status: DC

## 2013-06-01 NOTE — Progress Notes (Signed)
   Subjective:    Patient ID: [name], female    DOB: 11-03-1970, 43 y.o.   MRN: [id number]  HPI Acute visit Patient has a well-known history of eczema around the ears, last week the left ear was itching  severely, she scratched the outside and  started hurting at the canal  3 days ago.  Past Medical History  Diagnosis Date  . Asthma     pt stated treated as ashtma but not really asthma  . Chicken pox   . Depression   . Diabetes mellitus   . GERD (gastroesophageal reflux disease)   . Allergy   . Hypertension     readings  . Hyperlipidemia   . Migraine   . UTI (urinary tract infection)   . Irregular heartbeat   . Sleep apnea    Past Surgical History  Procedure Laterality Date  . Cesarean section      1995  . Thyroidectomy, partial  2001    removed left  . Abdominal hysterectomy      2009  . Back surgery  1999  . Navel repair  2003  . Ankle surgery  1989    left    Review of Systems Denies URI type of symptoms, no fever chills or ear discharge.    Objective:   Physical Exam  Constitutional: She appears well-developed and well-nourished. No distress.  HENT:  Dry and scaly skin around the ears worse on the right consistent with eczema. Right ear is otherwise normal. Left ear: no redness, canal is slightly swollen, no discharge but slt tender when I touch it w/ the otoscope. Has a small amount of debris in the canal but no d/c or pus. The concha is also slightly tender  Skin: She is not diaphoretic.      Assessment & Plan:  Ear infection, The patient has mild swelling in in the proximal canal and concha, likely a early infection. Will treat with doxycycline, she also has cortisporin drops and  I encouraged her to use them. She knows to call if she is not improving as the infex could get serious in patients with diabetes.  Ear eczema,  recommend to see dermatology, in the meantime use hydrocortisone

## 2013-06-01 NOTE — Progress Notes (Signed)
Pre visit review using our clinic review tool, if applicable. No additional management support is needed unless otherwise documented below in the visit note. 

## 2013-06-01 NOTE — Patient Instructions (Signed)
Take the antibiotic doxycycline twice a day for one week Continue using the ears drops 3 times a day for one week Apply the hydrocortisone cream twice a day to the outside of the ears and see the dermatology regards eczema. If you start hurting more, see more swelling or discharge from the Left ear: you need to be seen, call the office immediately.

## 2013-06-08 ENCOUNTER — Other Ambulatory Visit: Payer: Self-pay | Admitting: Family Medicine

## 2013-06-08 NOTE — Telephone Encounter (Signed)
Med filled.  

## 2013-06-13 ENCOUNTER — Other Ambulatory Visit: Payer: Self-pay | Admitting: Family Medicine

## 2013-06-14 ENCOUNTER — Telehealth: Payer: Self-pay | Admitting: *Deleted

## 2013-06-14 NOTE — Telephone Encounter (Signed)
Med filled.  

## 2013-06-14 NOTE — Telephone Encounter (Signed)
Pt notified and she notified she will be contacting us to notify of any abnormal sugars.

## 2013-06-14 NOTE — Telephone Encounter (Signed)
Pt can follow the dental advice given but she needs to check her sugars regularly so we can adjust meds

## 2013-06-14 NOTE — Telephone Encounter (Signed)
Patient called and stated that she had a root canal back in Dec. Patient states she has asked before about been put on steroids. Patient states she went back to the dentist and the highly recommend that she starts steroids. Patient states that she is willing to keep an eye on her BS, even if she has to check he sugars multiple times a day. Please advise. SW

## 2013-06-16 ENCOUNTER — Encounter: Payer: Self-pay | Admitting: Nurse Practitioner

## 2013-06-17 ENCOUNTER — Ambulatory Visit (INDEPENDENT_AMBULATORY_CARE_PROVIDER_SITE_OTHER): Payer: BC Managed Care – PPO | Admitting: Nurse Practitioner

## 2013-06-17 ENCOUNTER — Encounter: Payer: Self-pay | Admitting: Nurse Practitioner

## 2013-06-17 VITALS — BP 130/88 | HR 64 | Ht 68.25 in | Wt 343.0 lb

## 2013-06-17 DIAGNOSIS — B3731 Acute candidiasis of vulva and vagina: Secondary | ICD-10-CM

## 2013-06-17 DIAGNOSIS — F319 Bipolar disorder, unspecified: Secondary | ICD-10-CM

## 2013-06-17 DIAGNOSIS — I10 Essential (primary) hypertension: Secondary | ICD-10-CM

## 2013-06-17 DIAGNOSIS — E669 Obesity, unspecified: Secondary | ICD-10-CM

## 2013-06-17 DIAGNOSIS — E785 Hyperlipidemia, unspecified: Secondary | ICD-10-CM

## 2013-06-17 DIAGNOSIS — E119 Type 2 diabetes mellitus without complications: Secondary | ICD-10-CM

## 2013-06-17 DIAGNOSIS — Z01419 Encounter for gynecological examination (general) (routine) without abnormal findings: Secondary | ICD-10-CM

## 2013-06-17 DIAGNOSIS — Z0000 Encounter for general adult medical examination without abnormal findings: Secondary | ICD-10-CM

## 2013-06-17 DIAGNOSIS — B373 Candidiasis of vulva and vagina: Secondary | ICD-10-CM

## 2013-06-17 LAB — POCT URINALYSIS DIPSTICK
Bilirubin, UA: NEGATIVE
Blood, UA: NEGATIVE
Glucose, UA: NEGATIVE
Ketones, UA: NEGATIVE
Leukocytes, UA: NEGATIVE
Nitrite, UA: NEGATIVE
Protein, UA: NEGATIVE
UROBILINOGEN UA: NEGATIVE
pH, UA: 5

## 2013-06-17 MED ORDER — ESTRADIOL 1 MG PO TABS: ORAL_TABLET | ORAL | Status: DC

## 2013-06-17 MED ORDER — FLUCONAZOLE 150 MG PO TABS: ORAL_TABLET | ORAL | Status: DC

## 2013-06-17 NOTE — Progress Notes (Signed)
Patient ID: [name], female   DOB: 1970/11/22, 43 y.o.   MRN: [id number] 43 y.o. G9P1001 Married Caucasian Fe here for annual exam.  She has been on the higher doses of Estradiol since 2011 by Dr Tresa Res and feels less vaso symptoms.   No other  new diagnosis.  Now on med's for triglycerides. Recent trouble with yeast vaginitis.  Recent antibiotics for ear infection and root canal which only aggravated her symptoms of vaginal itching especially of the right labia and increased of vaginal discharge.   Also recent elevated blood sugar.  Last HGB AIC 9.2.  She plans to get back on weight watchers and do better.  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  Patient's last menstrual period was 01/25/2008.          Sexually active: yes  The current method of family planning is status post hysterectomy.    Exercising: no  The patient does not participate in regular exercise at present. Smoker:  no  Health Maintenance: Pap: TLH/BSO 2009 MMG:  05/18/13, Bi-Rads 2: benign findings TDaP:  2009 Labs:  PCP  Urine: negative   reports that she has never smoked. She does not have any smokeless tobacco history on file. She reports that she does not drink alcohol or use illicit drugs.  Past Medical History  Diagnosis Date  . Asthma     pt stated treated as ashtma but not really asthma  . Chicken pox   . Depression   . Diabetes mellitus   . GERD (gastroesophageal reflux disease)   . Allergy   . Hypertension     readings  . Hyperlipidemia   . Migraine   . Irregular heartbeat   . Sleep apnea   . Bipolar disorder     Past Surgical History  Procedure Laterality Date  . Cesarean section      1995  . Thyroidectomy, partial  2001    removed left  . Laparoscopic assisted vaginal hysterectomy  2009      BSO fibroids, DUB, pelvic pain  . Back surgery  1999  . Ankle surgery  1989    left  . Umbilical hernia repair  2003  .  Oophorectomy Bilateral 2009    cyst    Current Outpatient Prescriptions  Medication Sig Dispense Refill  . albuterol (PROVENTIL HFA;VENTOLIN HFA) 108 (90 BASE) MCG/ACT inhaler Inhale 2 puffs into the lungs every 4 (four) hours as needed for wheezing or shortness of breath.  1 Inhaler  3  . aspirin 81 MG tablet Take 160 mg by mouth daily.      [name] atorvastatin (LIPITOR) 40 MG tablet TAKE 1 TABLET BY MOUTH DAILY  90 tablet  1  . Calcium Carbonate-Vitamin D (CALTRATE 600+D PO) Take 1 tablet by mouth 2 (two) times daily.      . carbamazepine (TEGRETOL XR) 200 MG 12 hr tablet Take 200 mg by mouth 2 (two) times daily.      . carvedilol (COREG) 25 MG tablet TAKE 1 TABLET BY MOUTH TWICE DAILY  180 tablet  1  . cholecalciferol (VITAMIN D) 1000 UNITS tablet Take 2,000 Units by mouth daily.      [name] CLOBEX SPRAY 0.05 % external spray as directed.      [name] DERMOTIC 0.01 % OIL as directed.      [name] estradiol (ESTRACE) 1 MG tablet TAKE 1 TABLET BY MOUTH TWICE A DAY  180 tablet  3  . fenofibrate 160 MG tablet Take 1 tablet (160 mg total) by mouth daily.  30 tablet  3  . fluticasone (FLONASE) 50 MCG/ACT nasal spray Place 2 sprays into the nose daily.  16 g  2  . gabapentin (NEURONTIN) 600 MG tablet Take 600 mg by mouth 3 (three) times daily.      [name]. glimepiride (AMARYL) 4 MG tablet TAKE 1 TABLET BY MOUTH TWICE DAILY  60 tablet  4  . glucose blood test strip One touch ultra blue test strips  100 each  5  . hydrochlorothiazide (HYDRODIURIL) 25 MG tablet Take 1 tablet (25 mg total) by mouth daily.  90 tablet  3  . HYDROcodone-acetaminophen (NORCO/VICODIN) 5-325 MG per tablet as needed.      . hydrOXYzine (VISTARIL) 50 MG capsule Take 50 mg by mouth every 4 (four) hours as needed.      . Insulin Glargine (LANTUS SOLOSTAR) 100 UNIT/ML SOPN Inject 45 Units into the skin at bedtime.       . Insulin Pen Needle 31G X 5 MM MISC USE AS DIRECTED  100 each  3  . lamoTRIgine (LAMICTAL) 200 MG tablet Take 200 mg by mouth 2 (two)  times daily.      . Liraglutide (VICTOZA) 18 MG/3ML SOPN INJECT 1.8 MLS (1.2 MG) UNDER THE SKIN DAILY      . lisinopril (PRINIVIL,ZESTRIL) 20 MG tablet TAKE 1 TABLET BY MOUTH EVERY DAY  90 tablet  0  . meclizine (ANTIVERT) 50 MG tablet Take 1 tablet (50 mg total) by mouth 3 (three) times daily as needed.  30 tablet  0  . metFORMIN (GLUCOPHAGE) 1000 MG tablet TAKE 1 TABLET BY MOUTH TWICE DAILY WITH A MEAL  60 tablet  4  . omeprazole (PRILOSEC) 40 MG capsule TAKE 1 CAPSULE BY MOUTH EVERY DAY  30 capsule  3  . predniSONE (STERAPRED UNI-PAK) 5 MG TABS tablet as directed.      [name]. QVAR 80 MCG/ACT inhaler INHALE 2 PUFFS INTO THE LUNGS TWICE DAILY  8.7 g  5  . tacrolimus (PROTOPIC) 0.1 % ointment as directed.      . tiaGABine (GABITRIL) 4 MG tablet Take 8 mg by mouth 2 (two) times daily.      [name]. VICTOZA 18 MG/3ML SOPN INJECT 0.2 ML UNDER THE SKIN DAILY  6 mL  3  . fluconazole (DIFLUCAN) 150 MG tablet Take one tablet weekly X 4 weeks.  4 tablet  0  . traMADol (ULTRAM) 50 MG tablet Take 50 mg by mouth every 6 (six) hours as needed.       No current facility-administered medications for this visit.    Family History  Problem Relation Age of Onset  . Hyperlipidemia Mother   . Diabetes Mother   . Anxiety disorder Mother   . Heart disease Mother   . Hypertension Father   . Lupus Father   . Heart disease Father   . Stroke Maternal Aunt   . Stroke Paternal Grandfather   . Mental illness Paternal Grandfather     ROS:  Pertinent items are noted in HPI.  Otherwise, a comprehensive ROS was negative.  Exam:   BP 130/88  Pulse 64  Ht 5' 8.25" (1.734 m)  Wt 343 lb (155.584 kg)  BMI 51.74 kg/m2  LMP 01/25/2008 Height: 5' 8.25" (173.4 cm)  Ht Readings from Last 3 Encounters:  06/17/13 5' 8.25" (1.734 m)  05/11/13 5' 8.75" (1.746 m)  04/13/13 5'  9" (1.753 m)    General appearance: alert, cooperative and appears stated age Head: Normocephalic, without obvious abnormality, atraumatic Neck: no  adenopathy, supple, symmetrical, trachea midline and thyroid normal to inspection and palpation Lungs: clear to auscultation bilaterally Breasts: normal appearance, no masses or tenderness Heart: regular rate and rhythm Abdomen: soft, non-tender; no masses,  no organomegaly Extremities: extremities normal, atraumatic, no cyanosis or edema Skin: Skin color, texture, turgor normal. No rashes or lesions Lymph nodes: Cervical, supraclavicular, and axillary nodes normal. No abnormal inguinal nodes palpated Neurologic: Grossly normal   Pelvic: External genitalia:  Right labia is red and irritated with thickened areas consistent with chronic yeast.              Urethra:  normal appearing urethra with no masses, tenderness or lesions              Bartholin's and Skene's: normal                 Vagina: normal appearing vagina with normal color and discharge, no lesions              Cervix: absent              Pap taken: no Bimanual Exam:  Uterus:  uterus absent              Adnexa: no mass, fullness, tenderness limited secondary to body habitus.               Rectovaginal: Confirms               Anus:  normal sphincter tone, no lesions  A:  Well Woman with normal exam  S/P TLH / BSO 01/2008 secondary to fibroids, DUB, pelvic pain on ERT  History of HTN, DM, hyperlipidemia, obesity  Current yeast vaginitis  History of Bipolar Depression  P:   Pap smear as per guidelines Not done  Refill Estradiol 1 mg at twice daily for a year - discussed again to try and reduce to 1/2 tablet maybe every other day and see if smaller dose is tolerable.  States she will try again - had no success last year.  Counseled on potential risk of CVA, DVT, cancer, etc.  Diflucan 150 mg weekly X 4 weeks  Advise OTC Pro B  If right labia chronic yeast does not improve to call back for topical treatment  Mammogram is due 12/14  Counseled on breast self exam, mammography screening, adequate intake of calcium and vitamin  D, diet and exercise return annually or prn  An After Visit Summary was printed and given to the patient.

## 2013-06-17 NOTE — Patient Instructions (Addendum)

## 2013-06-22 NOTE — Progress Notes (Signed)
Reviewed personally.  M. Suzanne Erland Vivas, MD.  

## 2013-07-15 ENCOUNTER — Other Ambulatory Visit: Payer: Self-pay | Admitting: Family Medicine

## 2013-07-18 NOTE — Telephone Encounter (Signed)
Med filled.  

## 2013-07-19 ENCOUNTER — Other Ambulatory Visit: Payer: Self-pay | Admitting: General Practice

## 2013-07-19 MED ORDER — HYDROCHLOROTHIAZIDE 25 MG PO TABS: ORAL_TABLET | ORAL | Status: DC

## 2013-08-05 ENCOUNTER — Other Ambulatory Visit: Payer: Self-pay | Admitting: Family Medicine

## 2013-08-05 NOTE — Telephone Encounter (Signed)
Med filled.  

## 2013-08-10 ENCOUNTER — Other Ambulatory Visit: Payer: Self-pay | Admitting: Family Medicine

## 2013-08-11 ENCOUNTER — Telehealth: Payer: Self-pay | Admitting: Nurse Practitioner

## 2013-08-11 DIAGNOSIS — L292 Pruritus vulvae: Secondary | ICD-10-CM

## 2013-08-11 NOTE — Telephone Encounter (Signed)
Med filled.  

## 2013-08-11 NOTE — Telephone Encounter (Signed)
Per Lauro FranklinPatricia Rolen-Grubb, FNP note from 06/17/13:     If right labia chronic yeast does not improve to call back for topical treatment.   Patient has completed Diflucan x 4, 1 dose per week for 4 weeks.  States that diflucan did not help the symptoms at all. She describes external itching only in two certain spots, one area on right labia and one area where the labia majora meet. States she is diabetic and blood sugar has been elevated and she is working with pcp for lowering, but numbers have been between 200-275 despite lantus and glucophage. No Vaginal discharge or pain. Advised Lauro FranklinPatricia Rolen-Grubb, FNP not in the office and would send a message to covering provider to obtain instructions.   Dr. Hyacinth MeekerMiller, what do you advise for patient topical treatment or office visit with MD for evaluation?

## 2013-08-11 NOTE — Telephone Encounter (Signed)
OV with MD and make pt aware she may need a biopsy of the areas.  Sometimes this is the only way to know for sure what it is.

## 2013-08-11 NOTE — Telephone Encounter (Signed)
Patient calling re: "Patty told me to call if my yeast infection doesn't clear up and she would call something into the pharmacy for me. My yeast infection has not cleared up." Patient declined appointment.  [facility]

## 2013-08-12 NOTE — Telephone Encounter (Signed)
Pre-cert complete/PR: $10 copay//ssf

## 2013-08-12 NOTE — Telephone Encounter (Signed)
Please enter the order. There is no order linked to the patients appointment. Thanks

## 2013-08-12 NOTE — Telephone Encounter (Signed)
Spoke with pt to advise making an appt with an MD to evaluate continued itch despite 4 diflucan tabs. Advised pt she may need a biopsy of the areas. Scheduled appt with SM 08-17-13 at 1:30 per pt request. Advised someone would be calling with OOP cost after precertifying procedure with insurance. Pt agreeable.

## 2013-08-12 NOTE — Telephone Encounter (Signed)
Dr. Hyacinth MeekerMiller, I have the pt scheduled with you for possible vulvar biopsy 08-17-13. Do you want to enter the order now, or wait and see if needed at the time of visit?

## 2013-08-14 ENCOUNTER — Other Ambulatory Visit: Payer: Self-pay | Admitting: Family Medicine

## 2013-08-15 NOTE — Telephone Encounter (Signed)
Order placed and linked to appointment for possible vulvar bx as it has been pre certed.  Will close encounter.  Routing to provider for final review. Patient agreeable to disposition. Will close encounter

## 2013-08-15 NOTE — Telephone Encounter (Signed)
Med filled.  

## 2013-08-17 ENCOUNTER — Ambulatory Visit (INDEPENDENT_AMBULATORY_CARE_PROVIDER_SITE_OTHER): Payer: BC Managed Care – PPO | Admitting: Obstetrics & Gynecology

## 2013-08-17 VITALS — BP 118/68 | HR 64 | Resp 20 | Ht 68.25 in | Wt 346.0 lb

## 2013-08-17 DIAGNOSIS — L292 Pruritus vulvae: Secondary | ICD-10-CM

## 2013-08-17 DIAGNOSIS — L293 Anogenital pruritus, unspecified: Secondary | ICD-10-CM

## 2013-08-17 MED ORDER — CLOBETASOL PROPIONATE 0.05 % EX OINT: 1.0000 "application " | TOPICAL_OINTMENT | Freq: Two times a day (BID) | CUTANEOUS | Status: DC

## 2013-08-17 NOTE — Progress Notes (Signed)
Subjective:     Patient ID: [name], female   DOB: 1970/07/15, 43 y.o.   MRN: [id number]  HPI 43 yo G1P1 here for possible vulvar biopsy due to several weeks of vulvar itching in two very specific locations.  Has been on Diflucan for one month without any improvement.  Pt with hx of diabetes so has yeast vaginitis issues from time to time.  This feels very different to her as yeast vaginitis usually feels like an internal itch and this is external and in two specific locations.  Denies VB or discharge.  No new medical issues.  Review of Systems  All other systems reviewed and are negative.       Objective:   Physical Exam  Constitutional: She appears well-developed and well-nourished.  Genitourinary:      Vulvar biopsy recommended.  Consent obtained.  After exam, feel both lesions should be biopsied as they appear different.  Area cleansed with betadine x 3.  1% lidocaine instilled in leasion.  3mm punch biopsy obtained of each site.  Silver nitrate used for hemostasis.  Pt tolerated procedure well.    Assessment:     Vulvar itching and one area that is whitish and one that is erythematous     Plan:     Vulvar biopsies pending Clobetasol 0.05% ointment BID until results are back.  Rx to pharmacy.

## 2013-08-17 NOTE — Addendum Note (Signed)
Addended by: Jerene BearsMILLER, Danila Eddie S on: 08/17/2013 05:00 PM   Modules accepted: Orders

## 2013-08-17 NOTE — Patient Instructions (Signed)

## 2013-09-02 ENCOUNTER — Other Ambulatory Visit: Payer: Self-pay | Admitting: Family Medicine

## 2013-09-02 NOTE — Telephone Encounter (Signed)
med filled

## 2013-09-12 ENCOUNTER — Other Ambulatory Visit: Payer: Self-pay | Admitting: Family Medicine

## 2013-09-12 NOTE — Telephone Encounter (Signed)
Med filled.  

## 2013-09-19 ENCOUNTER — Ambulatory Visit (INDEPENDENT_AMBULATORY_CARE_PROVIDER_SITE_OTHER): Payer: BC Managed Care – PPO | Admitting: Obstetrics & Gynecology

## 2013-09-19 VITALS — BP 138/82 | HR 64 | Resp 16 | Ht 68.25 in | Wt 346.4 lb

## 2013-09-19 DIAGNOSIS — L280 Lichen simplex chronicus: Secondary | ICD-10-CM

## 2013-09-19 NOTE — Progress Notes (Signed)
Subjective:     Patient ID: [name], female   DOB: 1971-04-30, 43 y.o.   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  Pt reports itching is much better.  She is using the Clobetasol about every 2 to 3 days and Vaseline daily.  She finds she is not thinking about the itching all the time, either.  Sleep is better as well as she it not itching at night.  No vaginal discharge or new symptoms.  Pathology reviewed personally with pt.  Questions answered.  Review of Systems  All other systems reviewed and are negative.      Objective:   Physical Exam  Constitutional: She is oriented to person, place, and time. She appears well-developed and well-nourished.  Genitourinary:    There is lesion on the right labia. There is lesion on the left labia. No vaginal discharge found.  Lymphadenopathy:       Right: No inguinal adenopathy present.       Left: No inguinal adenopathy present.  Neurological: She is alert and oriented to person, place, and time.  Skin: Skin is warm and dry.  Psychiatric: She has a normal mood and affect.       Assessment:     Vulvar itching, improved with current therapeutic recommendations    Plan:     Continue the clobetasol 2-3 times weekly for another four weeks.  Continue to use the Vaseline nightly.   Pt is going to change fabric softeners and soaps and possible toilet paper to monitor for symptom improvement.    Does has a RF at the pharmacy as she didn't end up needing the original RX.  Will f/u with me for AEX.  Pt knows to call with any new issues/concerns.

## 2013-09-21 ENCOUNTER — Encounter: Payer: Self-pay | Admitting: Obstetrics & Gynecology

## 2013-09-21 DIAGNOSIS — L280 Lichen simplex chronicus: Secondary | ICD-10-CM | POA: Insufficient documentation

## 2013-09-24 ENCOUNTER — Other Ambulatory Visit: Payer: Self-pay | Admitting: Family Medicine

## 2013-09-26 NOTE — Telephone Encounter (Signed)
Med filled.  

## 2013-10-06 ENCOUNTER — Other Ambulatory Visit: Payer: Self-pay | Admitting: Family Medicine

## 2013-10-06 NOTE — Telephone Encounter (Signed)
Med filled.  

## 2013-10-15 ENCOUNTER — Other Ambulatory Visit: Payer: Self-pay | Admitting: Family Medicine

## 2013-10-19 NOTE — Telephone Encounter (Signed)
Med filled.  

## 2013-10-31 ENCOUNTER — Other Ambulatory Visit: Payer: Self-pay | Admitting: Family Medicine

## 2013-10-31 NOTE — Telephone Encounter (Signed)
Med filled.  

## 2013-11-04 ENCOUNTER — Ambulatory Visit: Payer: BC Managed Care – PPO | Admitting: Physician Assistant

## 2013-11-04 ENCOUNTER — Encounter: Payer: Self-pay | Admitting: Family Medicine

## 2013-11-04 ENCOUNTER — Ambulatory Visit (INDEPENDENT_AMBULATORY_CARE_PROVIDER_SITE_OTHER): Payer: BC Managed Care – PPO | Admitting: Family Medicine

## 2013-11-04 VITALS — BP 128/78 | HR 74 | Temp 97.8°F | Resp 18 | Wt 347.0 lb

## 2013-11-04 DIAGNOSIS — H60399 Other infective otitis externa, unspecified ear: Secondary | ICD-10-CM

## 2013-11-04 MED ORDER — AMOXICILLIN-POT CLAVULANATE 875-125 MG PO TABS: 1.0000 | ORAL_TABLET | Freq: Two times a day (BID) | ORAL | Status: DC

## 2013-11-04 NOTE — Patient Instructions (Signed)
Otitis Externa Otitis externa is a bacterial or fungal infection of the outer ear canal. This is the area from the eardrum to the outside of the ear. Otitis externa is sometimes called "swimmer's ear." CAUSES  Possible causes of infection include:  Swimming in dirty water.  Moisture remaining in the ear after swimming or bathing.  Mild injury (trauma) to the ear.  Objects stuck in the ear (foreign body).  Cuts or scrapes (abrasions) on the outside of the ear. SYMPTOMS  The first symptom of infection is often itching in the ear canal. Later signs and symptoms may include swelling and redness of the ear canal, ear pain, and yellowish-white fluid (pus) coming from the ear. The ear pain may be worse when pulling on the earlobe. DIAGNOSIS  Your caregiver will perform a physical exam. A sample of fluid may be taken from the ear and examined for bacteria or fungi. TREATMENT  Antibiotic ear drops are often given for 10 to 14 days. Treatment may also include pain medicine or corticosteroids to reduce itching and swelling. PREVENTION   Keep your ear dry. Use the corner of a towel to absorb water out of the ear canal after swimming or bathing.  Avoid scratching or putting objects inside your ear. This can damage the ear canal or remove the protective wax that lines the canal. This makes it easier for bacteria and fungi to grow.  Avoid swimming in lakes, polluted water, or poorly chlorinated pools.  You may use ear drops made of rubbing alcohol and vinegar after swimming. Combine equal parts of white vinegar and alcohol in a bottle. Put 3 or 4 drops into each ear after swimming. HOME CARE INSTRUCTIONS   Apply antibiotic ear drops to the ear canal as prescribed by your caregiver.  Only take over-the-counter or prescription medicines for pain, discomfort, or fever as directed by your caregiver.  If you have diabetes, follow any additional treatment instructions from your caregiver.  Keep all  follow-up appointments as directed by your caregiver. SEEK MEDICAL CARE IF:   You have a fever.  Your ear is still red, swollen, painful, or draining pus after 3 days.  Your redness, swelling, or pain gets worse.  You have a severe headache.  You have redness, swelling, pain, or tenderness in the area behind your ear. MAKE SURE YOU:   Understand these instructions.  Will watch your condition.  Will get help right away if you are not doing well or get worse. Document Released: 05/12/2005 Document Revised: 08/04/2011 Document Reviewed: 05/29/2011 Exit[employer] Patient Information 2014 [employer].  

## 2013-11-04 NOTE — Progress Notes (Signed)
Pre-visit discussion using our clinic review tool, as applicable. No additional management support is needed unless otherwise documented below in the visit note.  

## 2013-11-04 NOTE — Progress Notes (Signed)
   Subjective:    Patient ID: [name], female    DOB: Feb 05, 1971, 43 y.o.   MRN: [id number]  HPI  Pt here c/o R ear pain x few days. She has been using her regular meds but it has just worsened.    Review of Systems As above    Objective:   Physical Exam BP 128/78  Pulse 74  Temp(Src) 97.8 F (36.6 C) (Oral)  Resp 18  Wt 347 lb (157.398 kg)  SpO2 95%  LMP 01/25/2008 General appearance: alert, cooperative, appears stated age and no distress Ears: abnormal external canal left ear - edematous, no wax noted, tender tragus and external canal and eczematous changes external ear b/l   --- R> L,  Nose: Nares normal. Septum midline. Mucosa normal. No drainage or sinus tenderness. Throat: lips, mucosa, and tongue normal; teeth and gums normal Neck: no adenopathy, supple, symmetrical, trachea midline and thyroid not enlarged, symmetric, no tenderness/mass/nodules Lungs: clear to auscultation bilaterally Heart: regular rate and rhythm, S1, S2 normal, no murmur, click, rub or gallop        Assessment & Plan:  1. Otitis, externa, infective con't steroid spray and ciprodex - amoxicillin-clavulanate (AUGMENTIN) 875-125 MG per tablet; Take 1 tablet by mouth 2 (two) times daily.  Dispense: 20 tablet; Refill: 0 rto prn

## 2013-11-23 ENCOUNTER — Telehealth: Payer: Self-pay | Admitting: Family Medicine

## 2013-11-23 MED ORDER — ONDANSETRON HCL 4 MG PO TABS: 4.0000 mg | ORAL_TABLET | Freq: Three times a day (TID) | ORAL | Status: DC | PRN

## 2013-11-23 NOTE — Telephone Encounter (Signed)
Med filled and pt notified.  

## 2013-11-23 NOTE — Telephone Encounter (Signed)
Caller name: Uniqua Relation to pt: Call back number:[phone] [facility]  Reason for call:  Pt states she has had diarrhea and nausea alll day.    Pt took immodium but wants soemthign for nasuea.   Contact pt if we can get her something called in.

## 2013-11-23 NOTE — Telephone Encounter (Signed)
Ok for Zofran 4mg  TID prn, #20, no refills.  If no improvement in symptoms in the next few days, will need OV next week.

## 2013-12-01 ENCOUNTER — Other Ambulatory Visit: Payer: Self-pay | Admitting: Family Medicine

## 2013-12-02 NOTE — Telephone Encounter (Signed)
Med filled. Letter mailed to pt to make an appt.  

## 2013-12-14 ENCOUNTER — Other Ambulatory Visit: Payer: Self-pay | Admitting: Family Medicine

## 2013-12-15 NOTE — Telephone Encounter (Signed)
Med filled.  

## 2013-12-21 ENCOUNTER — Other Ambulatory Visit: Payer: Self-pay | Admitting: Family Medicine

## 2013-12-21 NOTE — Telephone Encounter (Signed)
Med filled.  

## 2013-12-22 ENCOUNTER — Other Ambulatory Visit: Payer: Self-pay | Admitting: Family Medicine

## 2013-12-22 NOTE — Telephone Encounter (Signed)
Med filled.  

## 2013-12-31 ENCOUNTER — Other Ambulatory Visit: Payer: Self-pay | Admitting: Family Medicine

## 2014-01-03 ENCOUNTER — Ambulatory Visit (INDEPENDENT_AMBULATORY_CARE_PROVIDER_SITE_OTHER): Payer: BC Managed Care – PPO | Admitting: Family Medicine

## 2014-01-03 ENCOUNTER — Other Ambulatory Visit: Payer: Self-pay

## 2014-01-03 ENCOUNTER — Encounter: Payer: Self-pay | Admitting: Family Medicine

## 2014-01-03 VITALS — BP 126/78 | HR 79 | Temp 98.1°F | Resp 16 | Wt 339.4 lb

## 2014-01-03 DIAGNOSIS — IMO0001 Reserved for inherently not codable concepts without codable children: Secondary | ICD-10-CM

## 2014-01-03 DIAGNOSIS — E1165 Type 2 diabetes mellitus with hyperglycemia: Secondary | ICD-10-CM

## 2014-01-03 DIAGNOSIS — R51 Headache: Secondary | ICD-10-CM

## 2014-01-03 DIAGNOSIS — I10 Essential (primary) hypertension: Secondary | ICD-10-CM

## 2014-01-03 DIAGNOSIS — K219 Gastro-esophageal reflux disease without esophagitis: Secondary | ICD-10-CM

## 2014-01-03 DIAGNOSIS — E782 Mixed hyperlipidemia: Secondary | ICD-10-CM

## 2014-01-03 DIAGNOSIS — R519 Headache, unspecified: Secondary | ICD-10-CM

## 2014-01-03 LAB — CBC WITH DIFFERENTIAL/PLATELET
BASOS ABS: 0 10*3/uL (ref 0.0–0.1)
Basophils Relative: 0.6 % (ref 0.0–3.0)
EOS PCT: 5.7 % — AB (ref 0.0–5.0)
Eosinophils Absolute: 0.4 10*3/uL (ref 0.0–0.7)
HEMATOCRIT: 39.5 % (ref 36.0–46.0)
Hemoglobin: 12.8 g/dL (ref 12.0–15.0)
LYMPHS ABS: 2.3 10*3/uL (ref 0.7–4.0)
Lymphocytes Relative: 30.7 % (ref 12.0–46.0)
MCHC: 32.4 g/dL (ref 30.0–36.0)
MCV: 86.2 fl (ref 78.0–100.0)
Monocytes Absolute: 0.5 10*3/uL (ref 0.1–1.0)
Monocytes Relative: 6.4 % (ref 3.0–12.0)
Neutro Abs: 4.3 10*3/uL (ref 1.4–7.7)
Neutrophils Relative %: 56.6 % (ref 43.0–77.0)
PLATELETS: 290 10*3/uL (ref 150.0–400.0)
RBC: 4.58 Mil/uL (ref 3.87–5.11)
RDW: 13.6 % (ref 11.5–15.5)
WBC: 7.6 10*3/uL (ref 4.0–10.5)

## 2014-01-03 LAB — LIPID PANEL
Cholesterol: 192 mg/dL (ref 0–200)
HDL: 48.8 mg/dL (ref >39.00)
LDL Cholesterol: 108 mg/dL — ABNORMAL HIGH (ref 0–99)
NONHDL: 143.2
TRIGLYCERIDES: 175 mg/dL — AB (ref 0.0–149.0)
Total CHOL/HDL Ratio: 4
VLDL: 35 mg/dL (ref 0.0–40.0)

## 2014-01-03 LAB — BASIC METABOLIC PANEL
BUN: 15 mg/dL (ref 6–23)
CHLORIDE: 99 meq/L (ref 96–112)
CO2: 24 meq/L (ref 19–32)
Calcium: 9.3 mg/dL (ref 8.4–10.5)
Creatinine, Ser: 0.7 mg/dL (ref 0.4–1.2)
GFR: 102.52 mL/min (ref >60.00)
Glucose, Bld: 150 mg/dL — ABNORMAL HIGH (ref 70–99)
POTASSIUM: 3.8 meq/L (ref 3.5–5.1)
Sodium: 136 mEq/L (ref 135–145)

## 2014-01-03 LAB — HEPATIC FUNCTION PANEL
ALK PHOS: 45 U/L (ref 39–117)
ALT: 24 U/L (ref 0–35)
AST: 24 U/L (ref 0–37)
Albumin: 3.8 g/dL (ref 3.5–5.2)
BILIRUBIN DIRECT: 0 mg/dL (ref 0.0–0.3)
BILIRUBIN TOTAL: 0.5 mg/dL (ref 0.2–1.2)
Total Protein: 7.3 g/dL (ref 6.0–8.3)

## 2014-01-03 LAB — TSH: TSH: 1.14 u[IU]/mL (ref 0.35–4.50)

## 2014-01-03 LAB — HEMOGLOBIN A1C: HEMOGLOBIN A1C: 8.8 % — AB (ref 4.6–6.5)

## 2014-01-03 LAB — H. PYLORI ANTIBODY, IGG: H Pylori IgG: NEGATIVE

## 2014-01-03 MED ORDER — FENOFIBRATE 160 MG PO TABS: 160.0000 mg | ORAL_TABLET | Freq: Every day | ORAL | Status: DC

## 2014-01-03 MED ORDER — OMEPRAZOLE 40 MG PO CPDR: 40.0000 mg | DELAYED_RELEASE_CAPSULE | Freq: Two times a day (BID) | ORAL | Status: DC

## 2014-01-03 MED ORDER — GLIMEPIRIDE 4 MG PO TABS: 4.0000 mg | ORAL_TABLET | Freq: Two times a day (BID) | ORAL | Status: DC

## 2014-01-03 MED ORDER — METFORMIN HCL 1000 MG PO TABS: 1000.0000 mg | ORAL_TABLET | Freq: Two times a day (BID) | ORAL | Status: DC

## 2014-01-03 MED ORDER — ATORVASTATIN CALCIUM 40 MG PO TABS: 40.0000 mg | ORAL_TABLET | Freq: Every day | ORAL | Status: DC

## 2014-01-03 NOTE — Assessment & Plan Note (Signed)
Chronic problem.  Tolerating meds w/o difficulty.  Applauded her recent weight loss efforts.  Check labs.  Adjust meds prn

## 2014-01-03 NOTE — Assessment & Plan Note (Signed)
Chronic problem.  UTD on eye exam.  On ACE for renal protection.  Pt is attempting to lose weight w/ healthy diet.  Applauded her efforts.  Check labs.  Adjust meds prn.

## 2014-01-03 NOTE — Progress Notes (Signed)
   Subjective:    Patient ID: [name], female    DOB: May 07, 1971, 43 y.o.   MRN: [id number]  HPI DM- chronic problem, on Metformin, Lantus 45 units.  Had to stop Victoza due to hypoglycemia and associated weakness.  On ACE for renal protection.  Pt has lost 8 lbs since June- doing Weight Watchers.  CBGs 120-200, usually <150.  Nightly CBGs 170-225.  UTD on eye exam (april 2015).  Intermittent numbness in feet- particularly w/ prolonged sitting.  Hyperlipidemia- chronic problem, on Fenofibrate and Lipitor.  Denies abd pain, N/V, myalgias.  HTN- chronic problem, on Lisinopril, HCTZ, Coreg.  Denies CP, SOB, HAs, visual changes, edema.  GERD- pt reports sxs are much worse.  On Omeprazole 40mg  daily.  sxs are now causing pt to sleep upright.  Had endoscopy ~18 months ago and was told it was normal.  HAs- pt has hx of migraines, reports that these have been 'significantly worse' in the last month.  HAs are occipital.  Described as 'someone inside my head hitting my skull w/ a hammer'.  No nausea, no dizziness.  Occuring 4-5 days/week.  Can occur 2-3x/day.  Will last up to an hr.  Pt has taken imitrex for migraines previously but this caused excessive sleepiness.  Review of Systems For ROS see HPI     Objective:   Physical Exam  Vitals reviewed. Constitutional: She is oriented to person, place, and time. She appears well-developed and well-nourished. No distress.  obese  HENT:  Head: Normocephalic and atraumatic.  Eyes: Conjunctivae and EOM are normal. Pupils are equal, round, and reactive to light.  Neck: Normal range of motion. Neck supple. No thyromegaly present.  Cardiovascular: Normal rate, regular rhythm, normal heart sounds and intact distal pulses.   No murmur heard. Pulmonary/Chest: Effort normal and breath sounds normal. No respiratory distress.  Abdominal: Soft. She exhibits no distension. There is no tenderness.  Musculoskeletal: She exhibits no edema.  Lymphadenopathy:   She has no cervical adenopathy.  Neurological: She is alert and oriented to person, place, and time. She has normal reflexes. No cranial nerve deficit. Coordination normal.  Gait WNL  Skin: Skin is warm and dry.  Psychiatric: She has a normal mood and affect. Her behavior is normal.          Assessment & Plan:

## 2014-01-03 NOTE — Telephone Encounter (Signed)
Med filled.  

## 2014-01-03 NOTE — Assessment & Plan Note (Signed)
Chronic problem.  Well controlled.  Asymptomatic w/ exception of ongoing headaches.  Check labs.  No anticipated med changes.

## 2014-01-03 NOTE — Patient Instructions (Signed)
Schedule your complete physical in 3-4 months We'll notify you of your lab results and make any changes if needed We'll schedule your head CT and get you to see neuro Keep up the good work on your weight loss- you're doing great! Increase the Omeprazole to twice daily Call with any questions or concerns Hang in there!

## 2014-01-03 NOTE — Progress Notes (Signed)
Pre visit review using our clinic review tool, if applicable. No additional management support is needed unless otherwise documented below in the visit note. 

## 2014-01-03 NOTE — Assessment & Plan Note (Signed)
New.  Pt has hx of migraines but reports this is unlike anything she has had before.  Pain is severe, recurrent.  Pt is scared at the change in her HAs.  Will get head CT to r/o mass.  Refer to neuro for complete evaluation and ongoing management.  Will follow.

## 2014-01-03 NOTE — Assessment & Plan Note (Signed)
Deteriorated.  Pt had endoscopy ~18 months ago that was WNL.  Will double PPI to BID and check labs to r/o H pylori.  If no improvement, will need to see GI.  Pt expressed understanding and is in agreement w/ plan.

## 2014-01-03 NOTE — Assessment & Plan Note (Signed)
Chronic problem.  Pt has committed to lose weight by changing diet.  Applauded her efforts.  Will continue to follow.

## 2014-01-04 ENCOUNTER — Ambulatory Visit (HOSPITAL_BASED_OUTPATIENT_CLINIC_OR_DEPARTMENT_OTHER): Payer: BC Managed Care – PPO

## 2014-01-04 ENCOUNTER — Encounter: Payer: Self-pay | Admitting: General Practice

## 2014-01-04 ENCOUNTER — Telehealth: Payer: Self-pay | Admitting: Family Medicine

## 2014-01-04 NOTE — Telephone Encounter (Signed)
Relevant patient education assigned to patient using Emmi. ° °

## 2014-01-05 ENCOUNTER — Ambulatory Visit (HOSPITAL_BASED_OUTPATIENT_CLINIC_OR_DEPARTMENT_OTHER)
Admission: RE | Admit: 2014-01-05 | Discharge: 2014-01-05 | Disposition: A | Discharge status: Home / Self Care | Payer: BC Managed Care – PPO | Source: Ambulatory Visit | Attending: Family Medicine | Admitting: Family Medicine

## 2014-01-05 DIAGNOSIS — R519 Headache, unspecified: Secondary | ICD-10-CM

## 2014-01-05 DIAGNOSIS — R51 Headache: Secondary | ICD-10-CM | POA: Diagnosis present

## 2014-01-24 ENCOUNTER — Other Ambulatory Visit: Payer: Self-pay | Admitting: Family Medicine

## 2014-01-24 NOTE — Telephone Encounter (Signed)
Med filled.  

## 2014-02-01 ENCOUNTER — Other Ambulatory Visit: Payer: Self-pay | Admitting: Family Medicine

## 2014-02-01 NOTE — Telephone Encounter (Signed)
Med filled.  

## 2014-02-10 ENCOUNTER — Encounter: Payer: Self-pay | Admitting: Neurology

## 2014-02-10 ENCOUNTER — Ambulatory Visit (INDEPENDENT_AMBULATORY_CARE_PROVIDER_SITE_OTHER): Payer: BC Managed Care – PPO | Admitting: Neurology

## 2014-02-10 VITALS — BP 132/84 | HR 78 | Resp 16 | Ht 69.0 in | Wt 337.2 lb

## 2014-02-10 DIAGNOSIS — G43709 Chronic migraine without aura, not intractable, without status migrainosus: Secondary | ICD-10-CM

## 2014-02-10 DIAGNOSIS — IMO0002 Reserved for concepts with insufficient information to code with codable children: Secondary | ICD-10-CM

## 2014-02-10 DIAGNOSIS — H539 Unspecified visual disturbance: Secondary | ICD-10-CM

## 2014-02-10 MED ORDER — SUMATRIPTAN SUCCINATE 100 MG PO TABS: ORAL_TABLET | ORAL | Status: DC

## 2014-02-10 MED ORDER — PROPRANOLOL HCL 40 MG PO TABS: 40.0000 mg | ORAL_TABLET | Freq: Two times a day (BID) | ORAL | Status: DC

## 2014-02-10 NOTE — Progress Notes (Addendum)
NEUROLOGY CONSULTATION NOTE  [name] MRN: [id number] DOB: October 28, 1970  Referring provider: Dr. Beverely Low Primary care provider: Dr. Beverely Low  Reason for consult:  Headache  HISTORY OF PRESENT ILLNESS: [name] is a 43 year old right-handed woman with history of diabetes mellitus, hypertension, hyperlipidemia, GERD, Bipolar disorder, morbid obesity and migraines who presents for headache.  Onset:  4 months ago Location:  Mid-left occipital region Quality:  Severe constant piercing Intensity:  6-9/10 Aura:  no Prodrome:  no Associated symptoms:  none Duration:  2-3 hours periodically throughout the day (sometimes up to 5 hours) Frequency:  Initially daily, now every other day Triggers/exacerbating factors:  Sleeping propt up (due to GERD) Relieving factors:  none Activity:  Functions  About 2 months prior to onset, she began having frequent episodes of her typical visual auras without her typical migraine.  They occur daily and still occur since onset of the new headaches.  However, they do not occur with the headaches.  Past abortive therapy:  none Past preventative therapy:  None  Current abortive therapy:  Exedrin migraine (ineffective, causes nausea), tylenol, Motrin ineffective Current preventative therapy:  none Other medications:  Tegretol XR  BID, gabapentin  TID, Lamictal  BID, Gabitril  BID  Caffeine:  Tea, soda 2x/week Alcohol:  no Smoker:  no Diet:  Weight watchers Exercise:  no Depression/stress:  Mood stable.  Stress varies. Sleep hygiene:  Good unless having reflux Family history of headache:  Mom (migraines)  01/05/14 CT HEAD WO:  unremarkable. 01/03/14 LABS:  Hgb A1c 8.8, TSH 1.14, CBC and CMP unremarkable except glucose 150.  Typical Migraines: Onset:  5th grade Location:  Holocephalic, bi-frontal/temporal and top of head, neck pain Quality:  Pressure, sometimes pounding Intensity:  8-10/10 Aura:  no Prodrome:  no Associated  symptoms:  Nausea, photophobia, phonophobia, sometimes vomiting and osmophobia Duration:  4 hours Frequency:  1 to 2 times a year Therapy:  Imitrex  PAST MEDICAL HISTORY: Past Medical History  Diagnosis Date  . Asthma     pt stated treated as ashtma but not really asthma  . Chicken pox   . Depression   . Diabetes mellitus   . GERD (gastroesophageal reflux disease)   . Allergy   . Hypertension     readings  . Hyperlipidemia   . Migraine   . Irregular heartbeat   . Sleep apnea   . Bipolar disorder     PAST SURGICAL HISTORY: Past Surgical History  Procedure Laterality Date  . Cesarean section      1995  . Thyroidectomy, partial  2001    removed left  . Laparoscopic assisted vaginal hysterectomy  2009      BSO fibroids, DUB, pelvic pain  . Back surgery  1999  . Ankle surgery  1989    left  . Umbilical hernia repair  2003  . Oophorectomy Bilateral 2009    cyst    MEDICATIONS: Current Outpatient Prescriptions on File Prior to Visit  Medication Sig Dispense Refill  . aspirin 81 MG tablet Take 160 mg by mouth daily.      [name] atorvastatin (LIPITOR) 40 MG tablet Take 1 tablet (40 mg total) by mouth daily.  90 tablet  1  . B-D UF III MINI PEN NEEDLES 31G X 5 MM MISC USE AS DIRECTED  100 each  2  . Calcium Carbonate-Vitamin D (CALTRATE 600+D PO) Take 1 tablet by mouth 2 (two) times daily.      . carbamazepine (TEGRETOL XR)  200 MG 12 hr tablet Take 200 mg by mouth 2 (two) times daily.      . carvedilol (COREG) 25 MG tablet TAKE 1 TABLET BY MOUTH TWICE DAILY  180 tablet  0  . cholecalciferol (VITAMIN D) 1000 UNITS tablet Take 2,000 Units by mouth daily.      . clobetasol ointment (TEMOVATE) 0.05 % Apply 1 application topically 2 (two) times daily. Apply as directed twice daily  60 g  0  . CLOBEX SPRAY 0.05 % external spray as directed.      [name] DERMOTIC 0.01 % OIL as directed.      [name] estradiol (ESTRACE) 1 MG tablet TAKE 1 TABLET BY MOUTH TWICE A DAY  180 tablet  3  . fenofibrate  160 MG tablet Take 1 tablet (160 mg total) by mouth daily.  30 tablet  5  . fluticasone (FLONASE) 50 MCG/ACT nasal spray Place 2 sprays into the nose daily.  16 g  2  . gabapentin (NEURONTIN) 600 MG tablet Take 600 mg by mouth 3 (three) times daily.      [name] glimepiride (AMARYL) 4 MG tablet Take 1 tablet (4 mg total) by mouth 2 (two) times daily.  60 tablet  5  . glucose blood (ONE TOUCH ULTRA TEST) test strip Pt tests glucose twice daily. Dx. 250.00  100 each  3  . hydrochlorothiazide (HYDRODIURIL) 25 MG tablet TAKE 1 TABLET BY MOUTH DAILY  90 tablet  0  . hydrOXYzine (VISTARIL) 50 MG capsule Take 50 mg by mouth every 4 (four) hours as needed.      . lamoTRIgine (LAMICTAL) 200 MG tablet Take 200 mg by mouth 2 (two) times daily.      [name] LANTUS SOLOSTAR 100 UNIT/ML Solostar Pen INJECT 60 UNITS SUBCUTANEOUS EVERY NIGHT AT BEDTIME  15 mL  6  . lisinopril (PRINIVIL,ZESTRIL) 20 MG tablet TAKE 1 TABLET BY MOUTH EVERY DAY  90 tablet  1  . meclizine (ANTIVERT) 50 MG tablet Take 1 tablet (50 mg total) by mouth 3 (three) times daily as needed.  30 tablet  0  . metFORMIN (GLUCOPHAGE) 1000 MG tablet Take 1 tablet (1,000 mg total) by mouth 2 (two) times daily with a meal.  60 tablet  5  . omeprazole (PRILOSEC) 40 MG capsule Take 1 capsule (40 mg total) by mouth 2 (two) times daily.  60 capsule  3  . ondansetron (ZOFRAN) 4 MG tablet Take 1 tablet (4 mg total) by mouth every 8 (eight) hours as needed for nausea or vomiting.  20 tablet  0  . PROVENTIL HFA 108 (90 BASE) MCG/ACT inhaler INHALE 2 PUFFS INTO THE LUNGS EVERY 4 HOURS AS NEEDED FOR WHEEZING OR SHORTNESS OF BREATH  6.7 g  0  . QVAR 80 MCG/ACT inhaler INHALE 2 PUFFS INTO LUNGS TWICE DAILY  8.7 g  3  . tacrolimus (PROTOPIC) 0.1 % ointment as directed.      . tiaGABine (GABITRIL) 4 MG tablet Take 8 mg by mouth 2 (two) times daily.       No current facility-administered medications on file prior to visit.    ALLERGIES: No Known Allergies  FAMILY  HISTORY: Family History  Problem Relation Age of Onset  . Hyperlipidemia Mother   . Diabetes Mother   . Anxiety disorder Mother   . Heart disease Mother   . Hypertension Father   . Lupus Father   . Heart disease Father   . Stroke Maternal Aunt   . Stroke Paternal  Grandfather   . Mental illness Paternal Grandfather     SOCIAL HISTORY: History   Social History  . Marital Status: Married    Spouse Name: N/A    Number of Children: 1  . Years of Education: N/A   Occupational History  . Not on file.   Social History Main Topics  . Smoking status: Never Smoker   . Smokeless tobacco: Not on file  . Alcohol Use: No  . Drug Use: No  . Sexual Activity: Yes    Partners: Male   Other Topics Concern  . Not on file   Social History Narrative  . No narrative on file    REVIEW OF SYSTEMS: Constitutional: No fevers, chills, or sweats, no generalized fatigue, change in appetite Eyes: No visual changes, double vision, eye pain Ear, nose and throat: No hearing loss, ear pain, nasal congestion, sore throat Cardiovascular: No chest pain, palpitations Respiratory:  No shortness of breath at rest or with exertion, wheezes GastrointestinaI: No nausea, vomiting, diarrhea, abdominal pain, fecal incontinence Genitourinary:  No dysuria, urinary retention or frequency Musculoskeletal:  No neck pain, back pain Integumentary: No rash, pruritus, skin lesions Neurological: as above Psychiatric: No depression, insomnia, anxiety Endocrine: No palpitations, fatigue, diaphoresis, mood swings, change in appetite, change in weight, increased thirst Hematologic/Lymphatic:  No anemia, purpura, petechiae. Allergic/Immunologic: no itchy/runny eyes, nasal congestion, recent allergic reactions, rashes  PHYSICAL EXAM: Filed Vitals:   02/10/14 0833  BP: 132/84  Pulse: 78  Resp: 16   General: No acute distress Head:  Normocephalic/atraumatic Neck: supple, no paraspinal tenderness, full range of  motion Back: No paraspinal tenderness Heart: regular rate and rhythm Lungs: Clear to auscultation bilaterally. Vascular: No carotid bruits. Neurological Exam: Mental status: alert and oriented to person, place, and time, recent and remote memory intact, fund of knowledge intact, attention and concentration intact, speech fluent and not dysarthric, language intact. Cranial nerves: CN I: not tested CN II: pupils equal, round and reactive to light, visual fields intact, fundi unremarkable, without vessel changes, exudates, hemorrhages or papilledema. CN III, IV, VI:  full range of motion, no nystagmus, no ptosis CN V: facial sensation intact CN VII: upper and lower face symmetric CN VIII: hearing intact CN IX, X: gag intact, uvula midline CN XI: sternocleidomastoid and trapezius muscles intact CN XII: tongue midline Bulk & Tone: normal, no fasciculations. Motor: 5/5 throughout Sensation: temperature and vibration intact Deep Tendon Reflexes: 2+ throughout except absent in knees, toes downgoing Finger to nose testing: no dysmetria Heel to shin: no dysmetria Gait: normal station and stride.  Able to turn and walk in tandem. Romberg negative.  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  I am hesitant about starting an antidepressant due to her history of Bipolar disorder.  She is already on 4 anti-epileptic medications for Bipolar.  I can start topamax, but I want to discuss with her psychiatrist, Dr. Nolen Mu, whether she has any objection to this in regards to her Bipolar.  The other option would be beta blockers, however it may interact with her Lantus.  Therefore, I will contact Dr. Beverely Low to see if this is not a possibility.    Other options are Botox, biofeedback or cognitive behavioral therapy.  But mostly, she needs to change her lifestyle.  She needs to lose weight and  exercise.  She started weight watchers, which is great.  I also encouraged her to start  exercising as well.  She will follow up in 2 months.  45 minutes spent with patient, over 50% spent discussing diagnosis and coordinating a plan.  Thank you for allowing me to take part in the care of this patient.  Shon Millet, DO  CC:  Neena Rhymes, MD  ADDENDUM: From Dr. Rennis Golden point of view, she has no problem initiating a beta blocker.  She will just monitor for hypoglycemia symptoms.  Will start propranolol  twice daily.  I contacted Ms. Weitzel with this update. Shon Millet, DO

## 2014-02-10 NOTE — Patient Instructions (Signed)
I think you are having chronic migraines.  However, I am really not sure which medication to give you, since there are contraindications with all of them.  You are already on 4 anti-seizure medications for bipolar.  We can try topamax, but I want to check with Dr. Nolen Mu to make sure there wouldn't be any contraindication.  I also want to check with Dr. Beverely Low to see if it would be a problem adding propranolol because it can interact with Lantus.  I will contact you early next week to let you know what we can try.  Otherwise, we can consider biofeedback, cognitive behavioral therapy or Botox.  In the meantime, start exercising regularly and continue weight watchers, as exercise and weight loss will definitely help.  Follow up in 2 months.

## 2014-02-10 NOTE — Addendum Note (Signed)
Addended byEverlena Cooper, Elesha Thedford R on: 02/10/2014 12:29 PM   Modules accepted: Orders

## 2014-02-13 ENCOUNTER — Other Ambulatory Visit: Payer: Self-pay | Admitting: Family Medicine

## 2014-02-13 NOTE — Telephone Encounter (Signed)
Med filled.  

## 2014-03-13 ENCOUNTER — Encounter: Payer: Self-pay | Admitting: Family Medicine

## 2014-03-13 ENCOUNTER — Ambulatory Visit (INDEPENDENT_AMBULATORY_CARE_PROVIDER_SITE_OTHER): Payer: BC Managed Care – PPO | Admitting: Family Medicine

## 2014-03-13 VITALS — BP 130/78 | HR 75 | Temp 98.1°F | Resp 17 | Wt 345.4 lb

## 2014-03-13 DIAGNOSIS — M5416 Radiculopathy, lumbar region: Secondary | ICD-10-CM

## 2014-03-13 MED ORDER — PREDNISONE 10 MG PO TABS: ORAL_TABLET | ORAL | Status: DC

## 2014-03-13 MED ORDER — CYCLOBENZAPRINE HCL 10 MG PO TABS: 10.0000 mg | ORAL_TABLET | Freq: Three times a day (TID) | ORAL | Status: DC | PRN

## 2014-03-13 NOTE — Progress Notes (Signed)
   Subjective:    Patient ID: [name], female    DOB: Jul 11, 1970, 43 y.o.   MRN: [id number]  HPI Back pain- pt reports back surgery 16 yrs ago and since than has hx of back spasms 2-3x/year.  Reports pain is lower than site of surgery.  Pain described as 'so tight'.  Improved w/ sitting compared to lying or standing.  Some improvement w/ motrin.  sxs started 6 days ago.  Some leg weakness bilaterally.  No recent injury.  No change in activity level.  Pt has been sitting differently recently that 'felt like i was straining my back'.  No bowel or bladder incontinence.  No fevers.   Review of Systems For ROS see HPI     Objective:   Physical Exam  Vitals reviewed. Constitutional: She is oriented to person, place, and time. She appears well-developed and well-nourished. No distress.  Cardiovascular: Intact distal pulses.   Musculoskeletal:  + TTP over R lumbar spine and SI joint, minimal TTP over L Pain w/ forward flexion, minimal difficulty w/ extension  Neurological: She is alert and oriented to person, place, and time. She has normal reflexes. No cranial nerve deficit. Coordination normal.  + SLR on R, (-) on L          Assessment & Plan:

## 2014-03-13 NOTE — Assessment & Plan Note (Signed)
New.  Pt has hx of similar but reports this is worse than previous.  No significant relief w/ NSAIDs.  Start muscle relaxers and pred taper.  Cautioned pt on elevated sugars.  If no improvement after pred taper, will need to see ortho or Neurosurg.  Reviewed supportive care and red flags that should prompt return.  Pt expressed understanding and is in agreement w/ plan.

## 2014-03-13 NOTE — Patient Instructions (Signed)
Follow up as scheduled Start the Prednisone as directed today at lunch- take w/ food Use the flexeril as needed for spasm- may cause drowsiness HEAT! If no improvement after the prednisone, please call me for a referral Call with any questions or concerns Hang in there!!!

## 2014-03-13 NOTE — Progress Notes (Signed)
Pre visit review using our clinic review tool, if applicable. No additional management support is needed unless otherwise documented below in the visit note. 

## 2014-03-20 ENCOUNTER — Other Ambulatory Visit: Payer: Self-pay | Admitting: Family Medicine

## 2014-03-20 NOTE — Telephone Encounter (Signed)
Med filled.  

## 2014-03-22 ENCOUNTER — Other Ambulatory Visit: Payer: Self-pay | Admitting: Family Medicine

## 2014-03-23 NOTE — Telephone Encounter (Signed)
Med filled.  

## 2014-03-24 ENCOUNTER — Ambulatory Visit (INDEPENDENT_AMBULATORY_CARE_PROVIDER_SITE_OTHER): Payer: BC Managed Care – PPO | Admitting: Family Medicine

## 2014-03-24 ENCOUNTER — Encounter: Payer: Self-pay | Admitting: Family Medicine

## 2014-03-24 VITALS — BP 132/80 | HR 70 | Temp 98.8°F | Resp 17 | Wt 343.5 lb

## 2014-03-24 DIAGNOSIS — J45901 Unspecified asthma with (acute) exacerbation: Secondary | ICD-10-CM | POA: Insufficient documentation

## 2014-03-24 DIAGNOSIS — R8290 Unspecified abnormal findings in urine: Secondary | ICD-10-CM

## 2014-03-24 LAB — POCT URINALYSIS DIPSTICK
BILIRUBIN UA: NEGATIVE
GLUCOSE UA: NEGATIVE
KETONES UA: NEGATIVE
Leukocytes, UA: NEGATIVE
Nitrite, UA: NEGATIVE
Protein, UA: NEGATIVE
RBC UA: NEGATIVE
Spec Grav, UA: >=1.03
Urobilinogen, UA: 0.2
pH, UA: 6

## 2014-03-24 MED ORDER — PROMETHAZINE-DM 6.25-15 MG/5ML PO SYRP: 5.0000 mL | ORAL_SOLUTION | Freq: Four times a day (QID) | ORAL | Status: DC | PRN

## 2014-03-24 MED ORDER — AZITHROMYCIN 250 MG PO TABS: 250.0000 mg | ORAL_TABLET | Freq: Every day | ORAL | Status: DC

## 2014-03-24 NOTE — Progress Notes (Signed)
   Subjective:    Patient ID: [name], female    DOB: Feb 18, 1971, 43 y.o.   MRN: [id number]  Cough   URI- 'i got a lot of chest congestion'.  'i just keep coughing and coughing'.  sxs started 1 week ago.  Cough is intermittently productive of dark yellow sputum.  No fevers.  Hx of asthma.  Pt w/ increased wheezing this week.  Mild nasal congestion.  Urine odor- pt reports odor, has increased water intake.  Denies urinary frequency.  Intermittent dysuria.   Review of Systems  Respiratory: Positive for cough.    For ROS see HPI     Objective:   Physical Exam  Vitals reviewed. Constitutional: She appears well-developed and well-nourished. No distress.  HENT:  Head: Normocephalic and atraumatic.  TMs normal bilaterally Mild nasal congestion Throat w/out erythema, edema, or exudate  Eyes: Conjunctivae and EOM are normal. Pupils are equal, round, and reactive to light.  Neck: Normal range of motion. Neck supple.  Cardiovascular: Normal rate, regular rhythm, normal heart sounds and intact distal pulses.   No murmur heard. Pulmonary/Chest: Effort normal and breath sounds normal. No respiratory distress. She has no wheezes.  + hacking cough  Lymphadenopathy:    She has no cervical adenopathy.          Assessment & Plan:

## 2014-03-24 NOTE — Patient Instructions (Signed)
Follow up as needed Start the Zpack as directed Use the cough syrup as needed- will cause drowsiness Drink plenty of fluids REST! Mucinex DM for daytime cough Call with any questions or concerns Hang in there!!

## 2014-03-24 NOTE — Progress Notes (Signed)
Pre visit review using our clinic review tool, if applicable. No additional management support is needed unless otherwise documented below in the visit note. 

## 2014-03-26 DIAGNOSIS — R8290 Unspecified abnormal findings in urine: Secondary | ICD-10-CM | POA: Insufficient documentation

## 2014-03-26 NOTE — Assessment & Plan Note (Signed)
New.  No evidence of UTI.  Reviewed that urine will frequently have an odor as medication is metabolized/excreted via kidneys.  No cause for alarm.

## 2014-03-26 NOTE — Assessment & Plan Note (Signed)
New.  Pt's sxs consistent w/ acute bronchitis exacerbation.  Due to hx of asthma, will start abx.  Inhalers prn.  Cough meds prn.  Reviewed supportive care and red flags that should prompt return.  Pt expressed understanding and is in agreement w/ plan.

## 2014-03-27 ENCOUNTER — Telehealth: Payer: Self-pay | Admitting: Family Medicine

## 2014-03-27 ENCOUNTER — Encounter: Payer: Self-pay | Admitting: Family Medicine

## 2014-03-27 MED ORDER — GUAIFENESIN-CODEINE 100-10 MG/5ML PO SYRP: ORAL_SOLUTION | ORAL | Status: DC

## 2014-03-27 NOTE — Telephone Encounter (Signed)
Med filled. Pt notified.  

## 2014-03-27 NOTE — Telephone Encounter (Signed)
Caller name: Rio Relation to pt: self Call back number: [phone] Pharmacy: [facility] [place] [place]  Reason for call:   Patient states that the cough medicine that was prescribed to her last week is not helping and would like to know if something else could be called in?

## 2014-03-27 NOTE — Telephone Encounter (Signed)
Ok for Cheratussin 5ml Q4-6 prn cough, #240, no refills

## 2014-04-04 ENCOUNTER — Telehealth: Payer: Self-pay | Admitting: Family Medicine

## 2014-04-04 ENCOUNTER — Telehealth: Payer: Self-pay | Admitting: Pulmonary Disease

## 2014-04-04 DIAGNOSIS — G4733 Obstructive sleep apnea (adult) (pediatric): Secondary | ICD-10-CM

## 2014-04-04 DIAGNOSIS — R0602 Shortness of breath: Secondary | ICD-10-CM

## 2014-04-04 NOTE — Telephone Encounter (Signed)
Caller name: Delorise ShinerSuggs, Romeka E Relation to pt: self  Call back number: [phone]   Reason for call:   Pt requesting a referral for pulmonary specialist was not happy with the prior pulmonary doctor. Please advise

## 2014-04-04 NOTE — Telephone Encounter (Signed)
Pt has not been seen since 09/26/10. Pt is considered new pt. Called Marge but was on hold for several minutes WCB

## 2014-04-04 NOTE — Telephone Encounter (Signed)
Referral placed.

## 2014-04-05 NOTE — Telephone Encounter (Signed)
Spoke with Marg in [place] office-states patient does not want to see [facility] anymore and its been over 3 years-pt can start seeing RA in [place] office as the location is easier for patient and Beverely Lowabori is the MD making the referral. Marg was transferred to front staff to make appt and will contact patient with information.  Nothing more needed at this time.

## 2014-04-07 ENCOUNTER — Ambulatory Visit (HOSPITAL_BASED_OUTPATIENT_CLINIC_OR_DEPARTMENT_OTHER)
Admission: RE | Admit: 2014-04-07 | Discharge: 2014-04-07 | Disposition: A | Discharge status: Home / Self Care | Payer: BC Managed Care – PPO | Source: Ambulatory Visit | Attending: Family Medicine | Admitting: Family Medicine

## 2014-04-07 ENCOUNTER — Encounter: Payer: Self-pay | Admitting: Family Medicine

## 2014-04-07 ENCOUNTER — Ambulatory Visit (INDEPENDENT_AMBULATORY_CARE_PROVIDER_SITE_OTHER): Payer: BC Managed Care – PPO | Admitting: Family Medicine

## 2014-04-07 VITALS — BP 130/80 | HR 84 | Temp 98.1°F | Resp 16 | Wt 339.5 lb

## 2014-04-07 DIAGNOSIS — J45901 Unspecified asthma with (acute) exacerbation: Secondary | ICD-10-CM

## 2014-04-07 DIAGNOSIS — IMO0002 Reserved for concepts with insufficient information to code with codable children: Secondary | ICD-10-CM

## 2014-04-07 DIAGNOSIS — R05 Cough: Secondary | ICD-10-CM | POA: Insufficient documentation

## 2014-04-07 DIAGNOSIS — E1165 Type 2 diabetes mellitus with hyperglycemia: Secondary | ICD-10-CM

## 2014-04-07 MED ORDER — AZITHROMYCIN 250 MG PO TABS
250.0000 mg | ORAL_TABLET | Freq: Every day | ORAL | Status: DC
Start: 2014-04-07 — End: 2014-05-04

## 2014-04-07 MED ORDER — PROMETHAZINE-DM 6.25-15 MG/5ML PO SYRP: 5.0000 mL | ORAL_SOLUTION | Freq: Four times a day (QID) | ORAL | Status: DC | PRN

## 2014-04-07 NOTE — Patient Instructions (Signed)
Go downstairs and get your Xray Repeat the Zpack Use the cough med as directed- Mucinex DM for daytime cough Continue to check your sugars- increase your Lantus to 50 units (2 units increase) if CBG 200-250, increase by 4 units (52 units) if 250-350, increase by 6 units (54 units) if >350 Try and limit your carb intake Call with any questions or concerns Hang in there!!!

## 2014-04-07 NOTE — Progress Notes (Signed)
Pre visit review using our clinic review tool, if applicable. No additional management support is needed unless otherwise documented below in the visit note. 

## 2014-04-07 NOTE — Progress Notes (Signed)
   Subjective:    Patient ID: [name], female    DOB: May 08, 1971, 43 y.o.   MRN: [id number]  HPI Cough- pt feels infxn has resolved but continues to cough regularly, productive of yellow sputum.  No fevers but having flushing and light headedness.  Some SOB due to cough.  Using inhaler regularly.  + fatigue, weakness.  No sinus pain/pressure.  Mild runny nose.  Pt reports CBGs as high as 400.   Review of Systems For ROS see HPI     Objective:   Physical Exam  Constitutional: She appears well-developed and well-nourished. No distress.  HENT:  Head: Normocephalic and atraumatic.  TMs normal bilaterally Mild nasal congestion Throat w/out erythema, edema, or exudate  Eyes: Conjunctivae and EOM are normal. Pupils are equal, round, and reactive to light.  Neck: Normal range of motion. Neck supple.  Cardiovascular: Normal rate, regular rhythm, normal heart sounds and intact distal pulses.   No murmur heard. Pulmonary/Chest: Effort normal and breath sounds normal. No respiratory distress. She has no wheezes.  No cough heard  Lymphadenopathy:    She has no cervical adenopathy.  Vitals reviewed.         Assessment & Plan:

## 2014-04-09 NOTE — Assessment & Plan Note (Signed)
Chronic problem.  Deteriorated in setting of recent infxn.  Adjust Lantus based on sliding scale.  Pt encouraged to limit carb intake.  Will follow.

## 2014-04-09 NOTE — Assessment & Plan Note (Addendum)
Pt has been treated w/ abx and cough meds.  Hx of asthma.  Due to elevated CBGs, concern for bacterial infxn.  Get CXR.  Zpack.  Cough meds prn.

## 2014-04-12 ENCOUNTER — Encounter: Payer: Self-pay | Admitting: Neurology

## 2014-04-12 ENCOUNTER — Ambulatory Visit (INDEPENDENT_AMBULATORY_CARE_PROVIDER_SITE_OTHER): Payer: BC Managed Care – PPO | Admitting: Neurology

## 2014-04-12 VITALS — BP 136/78 | HR 84 | Resp 18 | Ht 69.0 in | Wt 339.7 lb

## 2014-04-12 DIAGNOSIS — G43009 Migraine without aura, not intractable, without status migrainosus: Secondary | ICD-10-CM

## 2014-04-12 DIAGNOSIS — G43109 Migraine with aura, not intractable, without status migrainosus: Secondary | ICD-10-CM

## 2014-04-12 NOTE — Patient Instructions (Signed)
1.  Continue new sleep hygiene 2.  Continue exercise 3-4x/week 3.  Follow up in 3 months.

## 2014-04-12 NOTE — Progress Notes (Signed)
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  UPDATE: She never started on the propranolol 40mg  twice daily.  Instead, she started lifestyle modifications.  She stopped eating earlier in the evening and bought a new pillow so her head is more propped up in order to reduce GERD.  She reduced time on computer or watching TV.  She also started going to the gym 3-4x/week.  She has had maybe 3 headaches since last time, not severe.  She has Imitrex on hand, but has not needed to use it.  She has not had visual aura. Other medications:  Tegretol XR 200mg  BID, gabapentin 600mg  TID, Lamictal 200mg  BID, Gabitril 8mg  BID  HISTORY: Onset:  May 2015 Location:  Mid-left occipital region Quality:  Severe constant piercing Intensity:  6-9/10 Aura:  no Prodrome:  no Associated symptoms:  none Duration:  2-3 hours periodically throughout the day (sometimes up to 5 hours) Frequency:  Initially daily, now every other day Triggers/exacerbating factors:  Sleeping propt up (due to GERD) Relieving factors:  none Activity:  Functions  About 2 months prior to onset, she began having frequent episodes of her typical visual auras without her typical migraine.  They occur daily and still occur since onset of the new headaches.  However, they do not occur with the headaches.  Past abortive therapy:  none Past preventative therapy:  None  Caffeine:  Tea, soda 2x/week Alcohol:  no Smoker:  no Diet:  Weight watchers Exercise:  no Depression/stress:  Mood stable.  Stress varies. Sleep hygiene:  Good unless having reflux Family history of headache:  Mom (migraines)  01/05/14 CT HEAD WO:  unremarkable. 01/03/14 LABS:  Hgb A1c 8.8, TSH  1.14, CBC and CMP unremarkable except glucose 150.  Typical Migraines: Onset:  5th grade Location:  Holocephalic, bi-frontal/temporal and top of head, neck pain Quality:  Pressure, sometimes pounding Intensity:  8-10/10 Aura:  no Prodrome:  no Associated symptoms:  Nausea, photophobia, phonophobia, sometimes vomiting and osmophobia Duration:  4 hours Frequency:  1 to 2 times a year Therapy:  Imitrex  PAST MEDICAL HISTORY: Past Medical History  Diagnosis Date  . Asthma     pt stated treated as ashtma but not really asthma  . Chicken pox   . Depression   . Diabetes mellitus   . GERD (gastroesophageal reflux disease)   . Allergy   . Hypertension     readings  . Hyperlipidemia   . Migraine   . Irregular heartbeat   . Sleep apnea   . Bipolar disorder     MEDICATIONS: Current Outpatient Prescriptions on File Prior to Visit  Medication Sig Dispense Refill  . aspirin 81 MG tablet Take 160 mg by mouth daily.    [name]. atorvastatin (LIPITOR) 40 MG tablet Take 1 tablet (40 mg total) by mouth daily. 90 tablet 1  . azithromycin (ZITHROMAX Z-PAK) 250 MG tablet Take 1 tablet (250 mg total) by mouth daily. 2 tabs daily on day 1 and then 1 tab daily on day 2-5 6 tablet 0  . B-D UF III MINI PEN NEEDLES 31G X 5 MM MISC USE AS DIRECTED 100 each 2  . Calcium Carbonate-Vitamin D (CALTRATE 600+D PO) Take 1 tablet by mouth 2 (two) times daily.    .[name]  carbamazepine (TEGRETOL XR) 200 MG 12 hr tablet Take 200 mg by mouth 2 (two) times daily.    . carvedilol (COREG) 25 MG tablet TAKE 1 TABLET BY MOUTH TWICE DAILY 180 tablet 0  . cholecalciferol (VITAMIN D) 1000 UNITS tablet Take 2,000 Units by mouth daily.    . clobetasol ointment (TEMOVATE) 0.05 % Apply 1 application topically 2 (two) times daily. Apply as directed twice daily 60 g 0  . CLOBEX SPRAY 0.05 % external spray as directed.    . cyclobenzaprine (FLEXERIL) 10 MG tablet Take 1 tablet (10 mg total) by mouth 3 (three) times daily as needed for  muscle spasms. 30 tablet 0  . DERMOTIC 0.01 % OIL as directed.    [name]. estradiol (ESTRACE) 1 MG tablet TAKE 1 TABLET BY MOUTH TWICE A DAY 180 tablet 3  . fenofibrate 160 MG tablet Take 1 tablet (160 mg total) by mouth daily. 30 tablet 5  . fluticasone (FLONASE) 50 MCG/ACT nasal spray Place 2 sprays into the nose daily. 16 g 2  . gabapentin (NEURONTIN) 600 MG tablet Take 600 mg by mouth 3 (three) times daily.    [name]. glimepiride (AMARYL) 4 MG tablet Take 1 tablet (4 mg total) by mouth 2 (two) times daily. 60 tablet 5  . glucose blood (ONE TOUCH ULTRA TEST) test strip Pt tests glucose twice daily. Dx. 250.00 100 each 3  . hydrochlorothiazide (HYDRODIURIL) 25 MG tablet TAKE 1 TABLET BY MOUTH DAILY 90 tablet 1  . hydrOXYzine (VISTARIL) 50 MG capsule Take 50 mg by mouth every 4 (four) hours as needed.    . lamoTRIgine (LAMICTAL) 200 MG tablet Take 200 mg by mouth 2 (two) times daily.    [name]. LANTUS SOLOSTAR 100 UNIT/ML Solostar Pen INJECT 60 UNITS SUBCUTANEOUSLY EVERY NIGHT AT BEDTIME 15 mL 2  . lisinopril (PRINIVIL,ZESTRIL) 20 MG tablet TAKE 1 TABLET BY MOUTH EVERY DAY 90 tablet 1  . meclizine (ANTIVERT) 50 MG tablet Take 1 tablet (50 mg total) by mouth 3 (three) times daily as needed. 30 tablet 0  . metFORMIN (GLUCOPHAGE) 1000 MG tablet Take 1 tablet (1,000 mg total) by mouth 2 (two) times daily with a meal. 60 tablet 5  . omeprazole (PRILOSEC) 40 MG capsule Take 1 capsule (40 mg total) by mouth 2 (two) times daily. 60 capsule 3  . ondansetron (ZOFRAN) 4 MG tablet Take 1 tablet (4 mg total) by mouth every 8 (eight) hours as needed for nausea or vomiting. 20 tablet 0  . promethazine-dextromethorphan (PROMETHAZINE-DM) 6.25-15 MG/5ML syrup Take 5 mLs by mouth 4 (four) times daily as needed. 240 mL 0  . PROVENTIL HFA 108 (90 BASE) MCG/ACT inhaler INHALE 2 PUFFS INTO THE LUNGS EVERY 4 HOURS AS NEEDED FOR WHEEZING OR SHORTNESS OF BREATH 6.7 g 0  . QVAR 80 MCG/ACT inhaler INHALE 2 PUFFS INTO LUNGS TWICE DAILY 8.7 g  3  . SUMAtriptan (IMITREX) 100 MG tablet Take 1tab at earliest onset of headache.  May repeat x1 in 2 hours if headache persists or recurs. 9 tablet 0  . tacrolimus (PROTOPIC) 0.1 % ointment as directed.    . tiaGABine (GABITRIL) 4 MG tablet Take 8 mg by mouth 2 (two) times daily.    . propranolol (INDERAL) 40 MG tablet Take 1 tablet (40 mg total) by mouth 2 (two) times daily. 60 tablet 0   No current facility-administered medications on file prior to visit.    ALLERGIES: No Known Allergies  FAMILY HISTORY: Family History  Problem  Relation Age of Onset  . Hyperlipidemia Mother   . Diabetes Mother   . Anxiety disorder Mother   . Heart disease Mother   . Hypertension Father   . Lupus Father   . Heart disease Father   . Stroke Maternal Aunt   . Stroke Paternal Grandfather   . Mental illness Paternal Grandfather     SOCIAL HISTORY: History   Social History  . Marital Status: Married    Spouse Name: N/A    Number of Children: 1  . Years of Education: N/A   Occupational History  . Not on file.   Social History Main Topics  . Smoking status: Never Smoker   . Smokeless tobacco: Not on file  . Alcohol Use: No  . Drug Use: No  . Sexual Activity:    Partners: Male   Other Topics Concern  . Not on file   Social History Narrative    REVIEW OF SYSTEMS: Constitutional: No fevers, chills, or sweats, no generalized fatigue, change in appetite Eyes: No visual changes, double vision, eye pain Ear, nose and throat: No hearing loss, ear pain, nasal congestion, sore throat Cardiovascular: No chest pain, palpitations Respiratory:  No shortness of breath at rest or with exertion, wheezes GastrointestinaI: No nausea, vomiting, diarrhea, abdominal pain, fecal incontinence Genitourinary:  No dysuria, urinary retention or frequency Musculoskeletal:  No neck pain, back pain Integumentary: No rash, pruritus, skin lesions Neurological: as above Psychiatric: No depression, insomnia,  anxiety Endocrine: No palpitations, fatigue, diaphoresis, mood swings, change in appetite, change in weight, increased thirst Hematologic/Lymphatic:  No anemia, purpura, petechiae. Allergic/Immunologic: no itchy/runny eyes, nasal congestion, recent allergic reactions, rashes  PHYSICAL EXAM: Filed Vitals:   04/12/14 0802  BP: 136/78  Pulse: 84  Resp: 18   General: No acute distress Head:  Normocephalic/atraumatic Eyes:  Fundoscopic exam unremarkable without vessel changes, exudates, hemorrhages or papilledema. Neck: supple, no paraspinal tenderness, full range of motion Heart:  Regular rate and rhythm Lungs:  Clear to auscultation bilaterally Back: No paraspinal tenderness Neurological Exam: alert and oriented to person, place, and time. Attention span and concentration intact, recent and remote memory intact, fund of knowledge intact.  Speech fluent and not dysarthric, language intact.  CN II-XII intact. Fundoscopic exam unremarkable without vessel changes, exudates, hemorrhages or papilledema.  Bulk and tone normal, muscle strength 5/5 throughout.  Sensation to light touch intact.  Finger to nose intact.  Gait normal.  IMPRESSION: Migraine without aura, improved Visual aura without headache, improved.  PLAN: Continue lifestyle changes:  Sleep hygiene, exercise, work on losing weight. If headache frequency increases, may start propranolol.  Imitrex on-hand for abortive therapy Follow up in 3 months.  15 minutes spent with patient, over 50% spent counseling and coordinating care.  Shon Millet, DO  CC:  Neena Rhymes, MD

## 2014-04-19 ENCOUNTER — Other Ambulatory Visit: Payer: Self-pay | Admitting: Family Medicine

## 2014-04-21 NOTE — Telephone Encounter (Signed)
Med filled.  

## 2014-05-04 ENCOUNTER — Ambulatory Visit (INDEPENDENT_AMBULATORY_CARE_PROVIDER_SITE_OTHER): Payer: BC Managed Care – PPO | Admitting: Pulmonary Disease

## 2014-05-04 ENCOUNTER — Encounter: Payer: Self-pay | Admitting: Pulmonary Disease

## 2014-05-04 VITALS — BP 134/83 | HR 76 | Temp 98.4°F | Ht 69.0 in | Wt 344.0 lb

## 2014-05-04 DIAGNOSIS — G4733 Obstructive sleep apnea (adult) (pediatric): Secondary | ICD-10-CM

## 2014-05-04 NOTE — Progress Notes (Signed)
Subjective:    Patient ID: [name], female    DOB: [birth year], 43 y.o.   MRN: [id number]  HPI 43 year old obese woman referred for management of OSA. She underwent gastric bypass and dropped from 370 pounds to a lowest weight of around 200 but regained her weight again.  Polysomnogram in 2008 showed moderate OSA with AHI of 24 per hour, corrected by C Pap of 11 cm with a small nasal mask. She was initially compliant, but then stopped using it at least for the last 1 year. She now reports recurrent daytime fatigue and excessive somnolence and non-refreshing sleep. She has bipolar disorder and symptoms are controlled on Lamictal and Tegretol. She has been diagnosed with asthma and is maintained on Qvar and albuterol- prior attempt to discontinue Qvar caused increased dyspnea and wheezing.  Epworth sleepiness score is 11. Bedtime is around 10 PM,Sleep latency is minimal, she sleeps on her right side with 2 pillows due to reflux symptoms, reports 3-4 nocturnal awakenings with nocturia and is out of bed by 7 AM feeling tired with occasional dryness of mouth. She does admit to being a mouth breather. However in the past she was able to use nasal mask with a chinstrap  There is no history suggestive of cataplexy, sleep paralysis or parasomnias   Past Medical History  Diagnosis Date  . Asthma     pt stated treated as ashtma but not really asthma  . Chicken pox   . Depression   . Diabetes mellitus   . GERD (gastroesophageal reflux disease)   . Allergy   . Hypertension     readings  . Hyperlipidemia   . Migraine   . Irregular heartbeat   . Sleep apnea   . Bipolar disorder       Past Surgical History  Procedure Laterality Date  . Cesarean section      1995  . Thyroidectomy, partial  2001    removed left  . Laparoscopic assisted vaginal hysterectomy  2009      BSO fibroids, DUB, pelvic pain  . Back surgery  1999  . Ankle surgery  1989    left  . Umbilical hernia repair   2003  . Oophorectomy Bilateral 2009    cyst      Review of Systems  Constitutional: Negative for fever and unexpected weight change.  HENT: Negative for congestion, dental problem, ear pain, nosebleeds, postnasal drip, rhinorrhea, sinus pressure, sneezing, sore throat and trouble swallowing.   Eyes: Negative for redness and itching.  Respiratory: Negative for cough, chest tightness, shortness of breath and wheezing.   Cardiovascular: Negative for palpitations and leg swelling.  Gastrointestinal: Negative for nausea and vomiting.  Genitourinary: Negative for dysuria.  Musculoskeletal: Negative for joint swelling.  Skin: Negative for rash.  Neurological: Negative for headaches.  Hematological: Does not bruise/bleed easily.  Psychiatric/Behavioral: Negative for dysphoric mood. The patient is not nervous/anxious.        Objective:   Physical Exam  Gen. Pleasant, obese, in no distress, normal affect ENT - no lesions, no post nasal drip, class 2-3 airway Neck: No JVD, no thyromegaly, no carotid bruits Lungs: no use of accessory muscles, no dullness to percussion, decreased without rales or rhonchi  Cardiovascular: Rhythm regular, heart sounds  normal, no murmurs or gallops, no peripheral edema Abdomen: soft and non-tender, no hepatosplenomegaly, BS normal. Musculoskeletal: No deformities, no cyanosis or clubbing Neuro:  alert, non focal, no tremors        Assessment & Plan:

## 2014-05-04 NOTE — Patient Instructions (Signed)
CPAP supplies will be renewed Check download in 1 month to adjust pressure Weight loss recommended

## 2014-05-05 NOTE — Assessment & Plan Note (Signed)
Weight loss encouraged, compliance with goal of at least 4-6 hrs every night is the expectation. Advised against medications with sedative side effects Cautioned against driving when sleepy - understanding that sleepiness will vary on a day to day basis  She will renew her C Pap at 11 cm , she will use nasal mask with a chinstrap CPAP supplies will be renewed Check download in 1 month to adjust pressure -I doubt that we need another sleep study for this Weight loss recommended

## 2014-05-16 ENCOUNTER — Other Ambulatory Visit: Payer: Self-pay | Admitting: Family Medicine

## 2014-05-16 NOTE — Telephone Encounter (Signed)
Med filled.  

## 2014-05-23 ENCOUNTER — Other Ambulatory Visit: Payer: Self-pay | Admitting: Family Medicine

## 2014-05-23 ENCOUNTER — Telehealth: Payer: Self-pay | Admitting: Family Medicine

## 2014-05-23 NOTE — Telephone Encounter (Signed)
Caller name: Tineka Relation to pt: self Call back number: [phone] Pharmacy: [facility] on [address]  Reason for call:   Need one touch lancets 30G? Called in.

## 2014-05-23 NOTE — Telephone Encounter (Signed)
meds filled

## 2014-05-24 MED ORDER — LANCETS 30G MISC: Status: DC

## 2014-05-24 NOTE — Telephone Encounter (Signed)
Med filled.  

## 2014-06-05 ENCOUNTER — Ambulatory Visit (INDEPENDENT_AMBULATORY_CARE_PROVIDER_SITE_OTHER): Payer: BLUE CROSS/BLUE SHIELD | Admitting: Family Medicine

## 2014-06-05 ENCOUNTER — Encounter: Payer: Self-pay | Admitting: Family Medicine

## 2014-06-05 VITALS — BP 128/84 | HR 72 | Temp 98.0°F | Resp 17 | Wt 347.5 lb

## 2014-06-05 DIAGNOSIS — J45901 Unspecified asthma with (acute) exacerbation: Secondary | ICD-10-CM

## 2014-06-05 MED ORDER — PROMETHAZINE-DM 6.25-15 MG/5ML PO SYRP: 5.0000 mL | ORAL_SOLUTION | Freq: Four times a day (QID) | ORAL | Status: DC | PRN

## 2014-06-05 MED ORDER — AZITHROMYCIN 250 MG PO TABS: ORAL_TABLET | ORAL | Status: DC

## 2014-06-05 NOTE — Progress Notes (Signed)
   Subjective:    Patient ID: [name], female    DOB: 21-Jul-1970, 44 y.o.   MRN: [id number]  HPI URI- sxs started 1/4 w/ sore throat.  Had increased nasal congestion.  Developed body aches, chills, subjective fever.  Body aches and chills resolved by Thursday.  Now w/ improved sinus pain/pressure.  Cough is worsening.  + PND.  Cough is productive- dark green sputum.  + nausea, no vomiting.  + sick contacts.   Review of Systems For ROS see HPI     Objective:   Physical Exam  Constitutional: She appears well-developed and well-nourished. No distress.  HENT:  Head: Normocephalic and atraumatic.  TMs normal bilaterally Mild nasal congestion Throat w/out erythema, edema, or exudate  Eyes: Conjunctivae and EOM are normal. Pupils are equal, round, and reactive to light.  Neck: Normal range of motion. Neck supple.  Cardiovascular: Normal rate, regular rhythm, normal heart sounds and intact distal pulses.   No murmur heard. Pulmonary/Chest: Effort normal and breath sounds normal. No respiratory distress. She has no wheezes.  + hacking cough  Lymphadenopathy:    She has no cervical adenopathy.  Vitals reviewed.         Assessment & Plan:

## 2014-06-05 NOTE — Patient Instructions (Signed)
Follow up as needed Start the Zpack as directed Drink plenty of fluids REST! Continue to use the inhaler as needed Use the cough syrup as needed Mucinex DM for daytime cough Call with any questions or concerns Hang in there!!!

## 2014-06-05 NOTE — Assessment & Plan Note (Signed)
Recurrent problem for pt.  Will start Zpack for possible atypical infxn.  Cough meds prn.  Will hold off on prednisone as pt took round for shoulder pain and sugars increased to over 300.  If pt continues to have difficulty w/ bronchitis or recurrent cough, will need pulmonary f/u.  Reviewed supportive care and red flags that should prompt return.  Pt expressed understanding and is in agreement w/ plan.

## 2014-06-05 NOTE — Progress Notes (Signed)
Pre visit review using our clinic review tool, if applicable. No additional management support is needed unless otherwise documented below in the visit note. 

## 2014-06-08 ENCOUNTER — Other Ambulatory Visit: Payer: Self-pay | Admitting: Family Medicine

## 2014-06-08 NOTE — Telephone Encounter (Signed)
Med filled.  

## 2014-06-18 ENCOUNTER — Other Ambulatory Visit: Payer: Self-pay | Admitting: Family Medicine

## 2014-06-19 NOTE — Telephone Encounter (Signed)
Med filled.  

## 2014-06-21 ENCOUNTER — Ambulatory Visit (INDEPENDENT_AMBULATORY_CARE_PROVIDER_SITE_OTHER): Payer: BLUE CROSS/BLUE SHIELD | Admitting: Family Medicine

## 2014-06-21 ENCOUNTER — Encounter: Payer: Self-pay | Admitting: Family Medicine

## 2014-06-21 VITALS — BP 130/80 | HR 70 | Temp 97.4°F | Resp 16 | Wt 353.0 lb

## 2014-06-21 DIAGNOSIS — E1165 Type 2 diabetes mellitus with hyperglycemia: Secondary | ICD-10-CM

## 2014-06-21 DIAGNOSIS — IMO0002 Reserved for concepts with insufficient information to code with codable children: Secondary | ICD-10-CM

## 2014-06-21 DIAGNOSIS — J45901 Unspecified asthma with (acute) exacerbation: Secondary | ICD-10-CM

## 2014-06-21 MED ORDER — FLUTICASONE PROPIONATE HFA 110 MCG/ACT IN AERO: 2.0000 | INHALATION_SPRAY | Freq: Two times a day (BID) | RESPIRATORY_TRACT | Status: DC

## 2014-06-21 MED ORDER — LIRAGLUTIDE 18 MG/3ML ~~LOC~~ SOPN: 1.2000 mg | PEN_INJECTOR | Freq: Every day | SUBCUTANEOUS | Status: DC

## 2014-06-21 NOTE — Progress Notes (Signed)
   Subjective:    Patient ID: [name], female    DOB: 1970/11/07, 44 y.o.   MRN: [id number]  HPI Diabetes- 'extremely high'.  Took prednisone for shoulder pain and during this time eliminated carbs from diet.  CBGs 300-455.  Since then, has been unable to get sugar to return to normal.  Running 290-450.  Currently on Amaryl and Metformin w/ Lantus 65 units nightly.  Fasting sugar was still 320.  No CP, SOB, N/V.  + increased urination, increased thirst.   Review of Systems For ROS see HPI   Reviewed PMH in chart     Objective:   Physical Exam  Constitutional: She is oriented to person, place, and time. She appears well-developed and well-nourished. No distress.  obese  HENT:  Head: Normocephalic and atraumatic.  Eyes: Conjunctivae and EOM are normal. Pupils are equal, round, and reactive to light.  Neck: Normal range of motion. Neck supple. No thyromegaly present.  Cardiovascular: Normal rate, regular rhythm, normal heart sounds and intact distal pulses.   No murmur heard. Pulmonary/Chest: Effort normal and breath sounds normal. No respiratory distress.  Abdominal: Soft. She exhibits no distension. There is no tenderness.  Musculoskeletal: She exhibits no edema.  Lymphadenopathy:    She has no cervical adenopathy.  Neurological: She is alert and oriented to person, place, and time.  Skin: Skin is warm and dry.  Psychiatric: She has a normal mood and affect. Her behavior is normal.  Vitals reviewed.         Assessment & Plan:   Problem List Items Addressed This Visit    Diabetes mellitus type II, uncontrolled - Primary    Deteriorated.  Pt's sugars have been very high since completing her pred taper from ortho.  They are consistently running 300-400.  Pt is already on 65 units of Lantus in addition to oral meds.  Will start Victoza and refer to Endo.  Reviewed supportive care and red flags that should prompt return.  Pt expressed understanding and is in agreement w/ plan.         Relevant Medications   Liraglutide (VICTOZA) 18 MG/3ML SOPN   Other Relevant Orders   Ambulatory referral to Endocrinology   Hemoglobin A1c   Basic metabolic panel   Lipid panel   Hepatic function panel   TSH   Asthmatic bronchitis with acute exacerbation    Pt reports that Qvar is too expensive w/ new insurance.  Will switch to Flovent.  Prescription sent.  Pt instructed on use.      Relevant Medications   FLOVENT HFA 110 MCG/ACT IN AERO

## 2014-06-21 NOTE — Assessment & Plan Note (Signed)
Deteriorated.  Pt's sugars have been very high since completing her pred taper from ortho.  They are consistently running 300-400.  Pt is already on 65 units of Lantus in addition to oral meds.  Will start Victoza and refer to Endo.  Reviewed supportive care and red flags that should prompt return.  Pt expressed understanding and is in agreement w/ plan.

## 2014-06-21 NOTE — Patient Instructions (Signed)
We will call you with your Endo appt Continue the Lantus, Amaryl, and the Metformin daily Add the Victoza daily- start at 0.6mg  x1 week and increase to 1.2 mg daily We'll notify you of your lab results and make any changes if needed Continue to work on low carb diet Call with any questions or concerns Hang in there!!!

## 2014-06-21 NOTE — Assessment & Plan Note (Signed)
Pt reports that Qvar is too expensive w/ new insurance.  Will switch to Flovent.  Prescription sent.  Pt instructed on use.

## 2014-06-22 ENCOUNTER — Other Ambulatory Visit: Payer: Self-pay | Admitting: Family Medicine

## 2014-06-22 LAB — BASIC METABOLIC PANEL WITH GFR
BUN: 17 mg/dL (ref 6–23)
CO2: 26 meq/L (ref 19–32)
Calcium: 9.7 mg/dL (ref 8.4–10.5)
Chloride: 99 meq/L (ref 96–112)
Creatinine, Ser: 0.63 mg/dL (ref 0.40–1.20)
GFR: 109.83 mL/min
Glucose, Bld: 185 mg/dL — ABNORMAL HIGH (ref 70–99)
Potassium: 4.8 meq/L (ref 3.5–5.1)
Sodium: 136 meq/L (ref 135–145)

## 2014-06-22 LAB — LIPID PANEL
Cholesterol: 202 mg/dL — ABNORMAL HIGH (ref 0–200)
HDL: 59.7 mg/dL (ref >39.00)
NONHDL: 142.3
Total CHOL/HDL Ratio: 3
Triglycerides: 202 mg/dL — ABNORMAL HIGH (ref 0.0–149.0)
VLDL: 40.4 mg/dL — AB (ref 0.0–40.0)

## 2014-06-22 LAB — HEPATIC FUNCTION PANEL
ALT: 26 U/L (ref 0–35)
AST: 25 U/L (ref 0–37)
Albumin: 4 g/dL (ref 3.5–5.2)
Alkaline Phosphatase: 57 U/L (ref 39–117)
Bilirubin, Direct: 0.1 mg/dL (ref 0.0–0.3)
Total Bilirubin: 0.2 mg/dL (ref 0.2–1.2)
Total Protein: 7.2 g/dL (ref 6.0–8.3)

## 2014-06-22 LAB — LDL CHOLESTEROL, DIRECT: Direct LDL: 112 mg/dL

## 2014-06-22 LAB — HEMOGLOBIN A1C: Hgb A1c MFr Bld: 11 % — ABNORMAL HIGH (ref 4.6–6.5)

## 2014-06-22 LAB — TSH: TSH: 1.98 u[IU]/mL (ref 0.35–4.50)

## 2014-06-22 NOTE — Telephone Encounter (Signed)
Med filled.  

## 2014-06-23 ENCOUNTER — Encounter: Payer: Self-pay | Admitting: General Practice

## 2014-06-23 ENCOUNTER — Encounter: Payer: Self-pay | Admitting: Obstetrics & Gynecology

## 2014-06-23 ENCOUNTER — Ambulatory Visit (INDEPENDENT_AMBULATORY_CARE_PROVIDER_SITE_OTHER): Payer: BLUE CROSS/BLUE SHIELD | Admitting: Obstetrics & Gynecology

## 2014-06-23 VITALS — BP 126/82 | HR 64 | Resp 16 | Ht 68.25 in | Wt 352.2 lb

## 2014-06-23 DIAGNOSIS — Z0000 Encounter for general adult medical examination without abnormal findings: Secondary | ICD-10-CM

## 2014-06-23 DIAGNOSIS — Z01419 Encounter for gynecological examination (general) (routine) without abnormal findings: Secondary | ICD-10-CM

## 2014-06-23 LAB — POCT URINALYSIS DIPSTICK
BILIRUBIN UA: NEGATIVE
Blood, UA: NEGATIVE
Glucose, UA: NEGATIVE
KETONES UA: NEGATIVE
Leukocytes, UA: NEGATIVE
Nitrite, UA: NEGATIVE
Protein, UA: NEGATIVE
Urobilinogen, UA: NEGATIVE
pH, UA: 5

## 2014-06-23 MED ORDER — ESTRADIOL 1 MG PO TABS
ORAL_TABLET | ORAL | Status: DC
Start: 2014-06-23 — End: 2015-06-29

## 2014-06-23 MED ORDER — CLOBETASOL PROPIONATE 0.05 % EX OINT: 1.0000 "application " | TOPICAL_OINTMENT | Freq: Two times a day (BID) | CUTANEOUS | Status: DC

## 2014-06-23 MED ORDER — NYSTATIN 100000 UNIT/GM EX CREA: 1.0000 "application " | TOPICAL_CREAM | Freq: Two times a day (BID) | CUTANEOUS | Status: DC

## 2014-06-23 NOTE — Progress Notes (Signed)
44 y.o. G1P1001 MarriedCaucasianF here for annual exam.  [place]  Did this with [employer].  No vaginal bleeding.  Reviewed all notes from prior visit with me in 4/15 until now.  D/W pt Neurology appts as she was having more headaches.  Has seen Dr. Everlena [name] twice.  Has also seen Dr. Shelle [name].  Pt admits she was having issues with her CPAP and wasn't wearing it.  She was fitted with a smaller mask and that has really helped.  Just saw Dr. [name].  Blood sugars are all elevated.  Pt's HbA1C ws 11.0.  Victoza was added.  Pt reports blood sugar last night was 107.    PCP:  Dr. [name].    Patient's last menstrual period was 01/25/2008.          Sexually active: Yes.    The current method of family planning is status post hysterectomy.    Exercising: No.  not regularly Smoker:  no  Health Maintenance: Pap:  2009 History of abnormal Pap:  no MMG:  05/18/13 diag MMG/right breast us-screening one year Colonoscopy:  none BMD:   none TDaP:  06/07/14 Screening Labs: PCP, Hb today: PCP, Urine today: negative   reports that she has never smoked. She has never used smokeless tobacco. She reports that she does not drink alcohol or use illicit drugs.  Past Medical History  Diagnosis Date  . Asthma     pt stated treated as ashtma but not really asthma  . Chicken pox   . Depression   . Diabetes mellitus   . GERD (gastroesophageal reflux disease)   . Allergy   . Hypertension     readings  . Hyperlipidemia   . Migraine   . Irregular heartbeat   . Sleep apnea   . Bipolar disorder     Past Surgical History  Procedure Laterality Date  . Cesarean section      1995  . Thyroidectomy, partial  2001    removed left  . Laparoscopic assisted vaginal hysterectomy  2009      BSO fibroids, DUB, pelvic pain  . Back surgery  1999  . Ankle surgery  1989    left  . Umbilical hernia repair  2003  . Oophorectomy Bilateral 2009    cyst    Family History  Problem  Relation Age of Onset  . Hyperlipidemia Mother   . Diabetes Mother   . Anxiety disorder Mother   . Heart disease Mother   . Hypertension Father   . Lupus Father   . Heart disease Father   . Stroke Maternal Aunt   . Stroke Paternal Grandfather   . Mental illness Paternal Grandfather     ROS:  Pertinent items are noted in HPI.  Otherwise, a comprehensive ROS was negative.  Exam:   BP 126/82 mmHg  Pulse 64  Resp 16  Ht 5' 8.25" (1.734 m)  Wt 352 lb 3.2 oz (159.757 kg)  BMI 53.13 kg/m2  LMP 01/25/2008  Weight change: +9#  Height: 5' 8.25" (173.4 cm)   General appearance: alert, cooperative and appears stated age Head: Normocephalic, without obvious abnormality, atraumatic Neck: no adenopathy, supple, symmetrical, trachea midline and thyroid normal to inspection and palpation Lungs: clear to auscultation bilaterally Breasts: normal appearance, no masses or tenderness Heart: regular rate and rhythm Abdomen: soft, non-tender; bowel sounds normal; no masses,  no organomegaly Extremities: extremities normal, atraumatic, no cyanosis or edema Skin: Skin color, texture, turgor normal. No  rashes or lesions Lymph nodes: Cervical, supraclavicular, and axillary nodes normal. No abnormal inguinal nodes palpated Neurologic: Grossly normal   Pelvic: External genitalia:  no lesions              Urethra:  normal appearing urethra with no masses, tenderness or lesions              Bartholins and Skenes: normal                 Vagina: normal appearing vagina with normal color and discharge, no lesions              Cervix: absent              Pap taken: No. Bimanual Exam:  Uterus:  uterus absent              Adnexa: normal adnexa and no mass, fullness, tenderness               Rectovaginal: Confirms               Anus:  normal sphincter tone, no lesions  Chaperone was present for exam.  A:  Well Woman with normal exam S/P TLH / BSO 01/2008 secondary to fibroids, DUB, pelvic  pain on ERT History of HTN, DM, hyperlipidemia, obesity History of Bipolar   Chronic yeast due to elevated Blood sugars  P: MMG yearly.  Pt aware is due and she will schedule Refill Estradiol 1 mg at twice daily.  #180/4RF.   Lab work up to date with Dr. Beverely Low  Does see Pulmonologist yearly  Rx for Clobetasol 0.05% ointment to use bid up to 7 days  Nystatin cream externally prn itching AEX 1 year or f/u prm

## 2014-06-28 ENCOUNTER — Telehealth: Payer: Self-pay | Admitting: Pulmonary Disease

## 2014-06-28 NOTE — Telephone Encounter (Signed)
lmomtcb x1 

## 2014-06-29 NOTE — Telephone Encounter (Signed)
lmtcb for pt.  

## 2014-06-30 ENCOUNTER — Ambulatory Visit: Payer: BLUE CROSS/BLUE SHIELD | Admitting: Internal Medicine

## 2014-06-30 NOTE — Telephone Encounter (Signed)
lmtcb x3 

## 2014-07-03 NOTE — Telephone Encounter (Signed)
Pt has not returned our calls. Will close message at this time. 

## 2014-07-13 ENCOUNTER — Ambulatory Visit: Payer: BC Managed Care – PPO | Admitting: Neurology

## 2014-07-13 DIAGNOSIS — Z029 Encounter for administrative examinations, unspecified: Secondary | ICD-10-CM

## 2014-07-23 ENCOUNTER — Other Ambulatory Visit: Payer: Self-pay | Admitting: Family Medicine

## 2014-07-24 ENCOUNTER — Telehealth: Payer: Self-pay | Admitting: Neurology

## 2014-07-24 NOTE — Telephone Encounter (Signed)
Med filled.  

## 2014-07-24 NOTE — Telephone Encounter (Signed)
Pt no showed 07/13/14 appt w/ Dr. Everlena CooperJaffe. No show letter + policy mailed to pt / Sherri S.     Note to [employer] CorporationSherri - enter charge + send letter and self pay policy

## 2014-08-01 ENCOUNTER — Encounter: Payer: Self-pay | Admitting: *Deleted

## 2014-08-11 ENCOUNTER — Encounter: Payer: BLUE CROSS/BLUE SHIELD | Admitting: Family Medicine

## 2014-08-25 LAB — HM PAP SMEAR: HM PAP: NORMAL

## 2014-08-30 ENCOUNTER — Other Ambulatory Visit: Payer: Self-pay | Admitting: General Practice

## 2014-08-30 MED ORDER — ALBUTEROL SULFATE HFA 108 (90 BASE) MCG/ACT IN AERS: INHALATION_SPRAY | RESPIRATORY_TRACT | Status: DC

## 2014-09-11 ENCOUNTER — Other Ambulatory Visit: Payer: Self-pay | Admitting: General Practice

## 2014-09-11 MED ORDER — HYDROCHLOROTHIAZIDE 25 MG PO TABS: 25.0000 mg | ORAL_TABLET | Freq: Every day | ORAL | Status: DC

## 2014-09-24 ENCOUNTER — Encounter: Payer: Self-pay | Admitting: Family Medicine

## 2014-09-25 ENCOUNTER — Other Ambulatory Visit: Payer: Self-pay | Admitting: Family Medicine

## 2014-09-25 MED ORDER — QVAR 80 MCG/ACT IN AERS: 2.0000 | INHALATION_SPRAY | Freq: Two times a day (BID) | RESPIRATORY_TRACT | Status: DC

## 2014-09-25 NOTE — Telephone Encounter (Signed)
Med filled.  

## 2014-09-28 ENCOUNTER — Other Ambulatory Visit: Payer: Self-pay | Admitting: Family Medicine

## 2014-09-28 NOTE — Telephone Encounter (Signed)
Med filled.  

## 2014-10-11 ENCOUNTER — Other Ambulatory Visit: Payer: Self-pay

## 2014-10-11 DIAGNOSIS — Z1231 Encounter for screening mammogram for malignant neoplasm of breast: Secondary | ICD-10-CM

## 2014-10-22 ENCOUNTER — Telehealth: Payer: BLUE CROSS/BLUE SHIELD | Admitting: Family

## 2014-10-22 DIAGNOSIS — H109 Unspecified conjunctivitis: Secondary | ICD-10-CM

## 2014-10-22 MED ORDER — POLYMYXIN B-TRIMETHOPRIM 10000-0.1 UNIT/ML-% OP SOLN: 1.0000 [drp] | OPHTHALMIC | Status: DC

## 2014-10-22 NOTE — Addendum Note (Signed)
Addended by: Beau FannyWITHROW, Azlin Zilberman C on: 10/22/2014 02:54 PM   Modules accepted: Orders, Medications

## 2014-10-22 NOTE — Progress Notes (Signed)
We are sorry that you are not feeling well.  Here is how we plan to help!  Based on what you have shared with me it looks like you have conjunctivitis.  Conjunctivitis is a common inflammatory or infectious condition of the eye that is often referred to as "pink eye".  In most cases it is contagious (viral or bacterial). However, not all conjunctivitis requires antibiotics (ex. Allergic).  We have made appropriate suggestions for you based upon your presentation.  I have prescribed Polytrim Ophthalmic drops 1 drop in each eye every 4 hours each day times 5 days.   Pink eye can be highly contagious.  It is typically spread through direct contact with secretions, or contaminated objects or surfaces that one may have touched.  Strict handwashing is suggested with soap and water is urged.  If not available, use alcohol based had sanitizer.  Avoid unnecessary touching of the eye.  If you wear contact lenses, you will need to refrain from wearing them until you see no white discharge from the eye for at least 24 hours after being on medication.  You should see symptom improvement in 1-2 days after starting the medication regimen.  Call us if symptoms are not improved in 1-2 days.  Home Care:  Wash your hands often!  Do not wear your contacts until you complete your treatment plan.  Avoid sharing towels, bed linen, personal items with a person who has pink eye.  See attention for anyone in your home with similar symptoms.  Get Help Right Away If:  Your symptoms do not improve.  You develop blurred or loss of vision.  Your symptoms worsen (increased discharge, pain or redness)  Your e-visit answers were reviewed by a board certified advanced clinical practitioner to complete your personal care plan.  Depending on the condition, your plan could have included both over the counter or prescription medications.  If there is a problem please reply  once you have received a response from your  provider.  Your safety is important to us.  If you have drug allergies check your prescription carefully.    You can use MyChart to ask questions about today's visit, request a non-urgent call back, or ask for a work or school excuse.  You will get an e-mail in the next two days asking about your experience.  I hope that your e-visit has been valuable and will speed your recovery. Thank you for using e-visits.

## 2014-10-31 ENCOUNTER — Encounter: Payer: Self-pay | Admitting: Medical

## 2014-10-31 ENCOUNTER — Ambulatory Visit (HOSPITAL_BASED_OUTPATIENT_CLINIC_OR_DEPARTMENT_OTHER)
Admission: RE | Admit: 2014-10-31 | Discharge: 2014-10-31 | Disposition: A | Discharge status: Home / Self Care | Payer: 59 | Source: Ambulatory Visit | Attending: Medical | Admitting: Medical

## 2014-10-31 ENCOUNTER — Ambulatory Visit (INDEPENDENT_AMBULATORY_CARE_PROVIDER_SITE_OTHER): Payer: 59 | Admitting: Medical

## 2014-10-31 VITALS — BP 140/90 | HR 90 | Temp 98.9°F | Ht 68.5 in | Wt 355.0 lb

## 2014-10-31 DIAGNOSIS — R509 Fever, unspecified: Secondary | ICD-10-CM

## 2014-10-31 DIAGNOSIS — R05 Cough: Secondary | ICD-10-CM | POA: Insufficient documentation

## 2014-10-31 DIAGNOSIS — J209 Acute bronchitis, unspecified: Secondary | ICD-10-CM

## 2014-10-31 DIAGNOSIS — R0989 Other specified symptoms and signs involving the circulatory and respiratory systems: Secondary | ICD-10-CM | POA: Insufficient documentation

## 2014-10-31 LAB — CBC WITH DIFFERENTIAL/PLATELET
BASOS PCT: 1.2 % (ref 0.0–3.0)
Basophils Absolute: 0.1 10*3/uL (ref 0.0–0.1)
Eosinophils Absolute: 0.3 10*3/uL (ref 0.0–0.7)
Eosinophils Relative: 2.5 % (ref 0.0–5.0)
HCT: 39.9 % (ref 36.0–46.0)
HEMOGLOBIN: 13 g/dL (ref 12.0–15.0)
LYMPHS ABS: 1.6 10*3/uL (ref 0.7–4.0)
LYMPHS PCT: 13.3 % (ref 12.0–46.0)
MCHC: 32.7 g/dL (ref 30.0–36.0)
MCV: 84.3 fl (ref 78.0–100.0)
MONO ABS: 0.9 10*3/uL (ref 0.1–1.0)
Monocytes Relative: 7 % (ref 3.0–12.0)
NEUTROS ABS: 9.3 10*3/uL — AB (ref 1.4–7.7)
NEUTROS PCT: 76 % (ref 43.0–77.0)
Platelets: 334 10*3/uL (ref 150.0–400.0)
RBC: 4.73 Mil/uL (ref 3.87–5.11)
RDW: 13.4 % (ref 11.5–15.5)
WBC: 12.2 10*3/uL — ABNORMAL HIGH (ref 4.0–10.5)

## 2014-10-31 MED ORDER — AMOXICILLIN-POT CLAVULANATE 875-125 MG PO TABS: 1.0000 | ORAL_TABLET | Freq: Two times a day (BID) | ORAL | Status: DC

## 2014-10-31 MED ORDER — QVAR 80 MCG/ACT IN AERS: 2.0000 | INHALATION_SPRAY | Freq: Two times a day (BID) | RESPIRATORY_TRACT | Status: DC

## 2014-10-31 MED ORDER — BENZONATATE 100 MG PO CAPS: 100.0000 mg | ORAL_CAPSULE | Freq: Three times a day (TID) | ORAL | Status: DC | PRN

## 2014-10-31 NOTE — Progress Notes (Signed)
Pre visit review using our clinic review tool, if applicable. No additional management support is needed unless otherwise documented below in the visit note. 

## 2014-10-31 NOTE — Patient Instructions (Signed)
Acute bronchitis Rx rocephin im. By exam some sinus infection and OM. Rx augmentin. Stop zmax.  Benzonatate for cough. Flonase for congestion.  Recent dizziness I think may be sudafed use elevating your bp. So please stop any otc decongestants.  cxr today and cbc.   I am making albuterol and qvar inhaler available for any wheezing.  Follow up in 7 days or as needed

## 2014-10-31 NOTE — Progress Notes (Signed)
Subjective:    Patient ID: [name], female    DOB: [birth year], 44 y.o.   MRN: [id number]  HPI  Pt in today with some cough since last wed. This is getting worse each day. Friday got a lot worse and dark green producutive cough. Subjective fever over weekend. Mild chills.   Hx of bronchtis. She does not smoke.  Occasional wheezing. No asthma dx in past. But then states treated as if asthma. When she gets sick often does need inhalers.   This weekend had very bad ear pain.  Pt did MD live over the weekend. She got zpack called in but she is getting a lot worse.  Some dizziness intermitent. Pt has been taking some sudafed recently.,      Review of Systems  Constitutional: Positive for fever. Negative for chills and fatigue.  HENT: Positive for congestion, ear pain and sinus pressure. Negative for ear discharge, postnasal drip, rhinorrhea and sore throat.   Respiratory: Positive for cough and wheezing. Negative for choking and shortness of breath.   Cardiovascular: Negative for chest pain and palpitations.  Gastrointestinal: Negative.        Did vomit couple of times but associated with severe cough.  Musculoskeletal: Negative for back pain.  Neurological: Negative for dizziness and headaches.       Transient dizziness intermittent.   Hematological: Negative for adenopathy. Does not bruise/bleed easily.  Psychiatric/Behavioral: Negative for behavioral problems and confusion.       Frustrated due to illness.    Past Medical History  Diagnosis Date  . Asthma     pt stated treated as ashtma but not really asthma  . Chicken pox   . Depression   . Diabetes mellitus   . GERD (gastroesophageal reflux disease)   . Allergy   . Hypertension     readings  . Hyperlipidemia   . Migraine   . Irregular heartbeat   . Sleep apnea   . Bipolar disorder     History   Social History  . Marital Status: Married    Spouse Name: N/A  . Number of Children: 1  . Years of Education:  N/A   Occupational History  . Not on file.   Social History Main Topics  . Smoking status: Never Smoker   . Smokeless tobacco: Never Used  . Alcohol Use: No  . Drug Use: No  . Sexual Activity:    Partners: Male   Other Topics Concern  . Not on file   Social History Narrative    Past Surgical History  Procedure Laterality Date  . Cesarean section      1995  . Thyroidectomy, partial  2001    removed left  . Laparoscopic assisted vaginal hysterectomy  2009      BSO fibroids, DUB, pelvic pain  . Back surgery  1999  . Ankle surgery  1989    left  . Umbilical hernia repair  2003  . Oophorectomy Bilateral 2009    cyst    Family History  Problem Relation Age of Onset  . Hyperlipidemia Mother   . Diabetes Mother   . Anxiety disorder Mother   . Heart disease Mother   . Hypertension Father   . Lupus Father   . Heart disease Father   . Stroke Maternal Aunt   . Stroke Paternal Grandfather   . Mental illness Paternal Grandfather     No Known Allergies  Current Outpatient Prescriptions on File Prior to Visit  Medication  Sig Dispense Refill  . albuterol (PROVENTIL HFA) 108 (90 BASE) MCG/ACT inhaler INHALE 2 PUFFS INTO THE LUNGS EVERY 4 HOURS AS NEEDED FOR WHEEZING OR SHORTNESS OF BREATH 6.7 g 3  . aspirin 81 MG tablet Take 160 mg by mouth daily.    [name] atorvastatin (LIPITOR) 40 MG tablet Take 1 tablet (40 mg total) by mouth daily. 90 tablet 1  . B-D UF III MINI PEN NEEDLES 31G X 5 MM MISC USE AS DIRECTED 100 each 2  . Calcium Carbonate-Vitamin D (CALTRATE 600+D PO) Take 1 tablet by mouth 2 (two) times daily.    . carbamazepine (TEGRETOL XR) 200 MG 12 hr tablet Take 200 mg by mouth. 1 tablet in the morning, 2 at night    . carvedilol (COREG) 25 MG tablet TAKE 1 TABLET BY MOUTH TWICE DAILY 180 tablet 0  . cholecalciferol (VITAMIN D) 1000 UNITS tablet Take 2,000 Units by mouth daily.    . clobetasol ointment (TEMOVATE) 0.05 % Apply 1 application topically 2 (two) times daily.  Do not use for more than 7 days 60 g 0  . CLOBEX SPRAY 0.05 % external spray as directed.    . cyclobenzaprine (FLEXERIL) 10 MG tablet Take 1 tablet (10 mg total) by mouth 3 (three) times daily as needed for muscle spasms. 30 tablet 0  . DERMOTIC 0.01 % OIL as directed.    [name] estradiol (ESTRACE) 1 MG tablet TAKE 1 TABLET BY MOUTH TWICE A DAY 180 tablet 3  . fenofibrate 160 MG tablet Take 1 tablet (160 mg total) by mouth daily. 30 tablet 5  . fluticasone (FLONASE) 50 MCG/ACT nasal spray Place 2 sprays into the nose daily. 16 g 2  . fluticasone (FLOVENT HFA) 110 MCG/ACT inhaler Inhale 2 puffs into the lungs 2 (two) times daily. 1 Inhaler 12  . gabapentin (NEURONTIN) 600 MG tablet Take 600 mg by mouth 3 (three) times daily.    [name] glimepiride (AMARYL) 4 MG tablet Take 1 tablet (4 mg total) by mouth 2 (two) times daily. 60 tablet 5  . hydrochlorothiazide (HYDRODIURIL) 25 MG tablet Take 1 tablet (25 mg total) by mouth daily. 90 tablet 1  . hydrOXYzine (VISTARIL) 50 MG capsule Take 50 mg by mouth every 4 (four) hours as needed.    . lamoTRIgine (LAMICTAL) 200 MG tablet Take 200 mg by mouth 2 (two) times daily.    . Lancets 30G MISC Use one lancet each time sugars are checked. Pt tests twice daily. DX E11.9 100 each 2  . LANTUS SOLOSTAR 100 UNIT/ML Solostar Pen INJECT 60 UNITS SUBCUTANEOUSLY AT BEDTIME 15 mL 1  . Liraglutide (VICTOZA) 18 MG/3ML SOPN Inject 0.2 mLs (1.2 mg total) into the skin daily. 6 mL 3  . lisinopril (PRINIVIL,ZESTRIL) 20 MG tablet TAKE 1 TABLET BY MOUTH EVERY DAY 90 tablet 1  . meloxicam (MOBIC) 15 MG tablet   1  . metFORMIN (GLUCOPHAGE) 1000 MG tablet Take 1 tablet (1,000 mg total) by mouth 2 (two) times daily with a meal. 60 tablet 5  . nystatin cream (MYCOSTATIN) Apply 1 application topically 2 (two) times daily. Apply to affected area BID for up to 7 days then prn 30 g 2  . omeprazole (PRILOSEC) 40 MG capsule TAKE 1 CAPSULE BY MOUTH TWICE DAILY 60 capsule 6  . ONE TOUCH ULTRA TEST  test strip TEST TWICE DAILY 100 each 2  . SUMAtriptan (IMITREX) 100 MG tablet Take 1tab at earliest onset of headache.  May repeat x1 in  2 hours if headache persists or recurs. 9 tablet 0  . tacrolimus (PROTOPIC) 0.1 % ointment as directed.    . tiaGABine (GABITRIL) 4 MG tablet Take 8 mg by mouth 2 (two) times daily.    [name] trimethoprim-polymyxin b (POLYTRIM) ophthalmic solution Place 1 drop into both eyes every 4 (four) hours. for 5 days 10 mL 0   No current facility-administered medications on file prior to visit.    BP 140/90 mmHg  Pulse 90  Temp(Src) 98.9 F (37.2 C) (Oral)  Ht 5' 8.5" (1.74 m)  Wt 355 lb (161.027 kg)  BMI 53.19 kg/m2  SpO2 94%  LMP 01/25/2008       Objective:   Physical Exam  General  Mental Status - Alert. General Appearance - Well groomed. Not in acute distress.  Skin Rashes- No Rashes.  HEENT Head- Normal. Ear Auditory Canal - Left- Normal. Right - Normal.Tympanic Membrane- Left- mild dull. Right- mild dull. Eye Sclera/Conjunctiva- Left- Normal. Right- Normal. Nose & Sinuses Nasal Mucosa- Left-  Boggy and Congested. Right-  Boggy and  Congested.Bilateral maxillary sinus pressure but  No frontal sinus pressure. Mouth & Throat Lips: Upper Lip- Normal: no dryness, cracking, pallor, cyanosis, or vesicular eruption. Lower Lip-Normal: no dryness, cracking, pallor, cyanosis or vesicular eruption. Buccal Mucosa- Bilateral- No Aphthous ulcers. Oropharynx- No Discharge or Erythema. Tonsils: Characteristics- Bilateral- No Erythema or Congestion. Size/Enlargement- Bilateral- No enlargement. Discharge- bilateral-None.  Neck Neck- Supple. No Masses.   Chest and Lung Exam Auscultation: Breath Sounds:-Clear even and unlabored. But faint upper lobe rhonchi bilateral.  Cardiovascular Auscultation:Rythm- Regular, rate and rhythm. Murmurs & Other Heart Sounds:Ausculatation of the heart reveal- No Murmurs.  Lymphatic Head & Neck General Head & Neck  Lymphatics: Bilateral: Description- No Localized lymphadenopathy.   Neurologic Cranial Nerve exam:- CN III-XII intact(No nystagmus), symmetric smile. Strength:- 5/5 equal and symmetric strength both upper and lower extremities.      Assessment & Plan:

## 2014-10-31 NOTE — Assessment & Plan Note (Signed)
Rx rocephin im. By exam some sinus infection and OM. Rx augmentin. Stop zmax.  Benzonatate for cough. Flonase for congestion.  Recent dizziness I think may be sudafed use elevating your bp. So please stop any otc decongestants.  cxr today and cbc.   I am making albuterol and qvar inhaler available for any wheezing.  Follow up in 7 days or as needed

## 2014-11-02 ENCOUNTER — Ambulatory Visit: Payer: BLUE CROSS/BLUE SHIELD | Admitting: Family Medicine

## 2014-11-10 ENCOUNTER — Other Ambulatory Visit: Payer: Self-pay | Admitting: Family Medicine

## 2014-11-10 NOTE — Telephone Encounter (Signed)
Medication filled.    FYI, Dr. [name] Pt cancelled her new pt referral to Endo and was last seen by you in January for her Diabetes follow up. Last A1c was 11. Please advise, do i just need to call pt to make sure she is seen at endo or does she need a follow up with you?

## 2014-11-15 NOTE — Telephone Encounter (Signed)
Pt needs to have ENDO appt.  If she cannot get in to be seen quickly, she will need to f/u w/ me.  But due to her poorly controlled diabetes, she needs an appt somewhere and needs to f/u as recommended.

## 2014-11-15 NOTE — Telephone Encounter (Signed)
Called pt and left a detailed message informing her on the need to see endocrinology. Pt also advised on message that if she cannot get in to see them then she MUST make an appt with Dr. Beverely Low.

## 2014-11-16 ENCOUNTER — Encounter: Payer: Self-pay | Admitting: Family Medicine

## 2014-11-16 ENCOUNTER — Other Ambulatory Visit: Payer: Self-pay | Admitting: Family Medicine

## 2014-11-16 DIAGNOSIS — IMO0002 Reserved for concepts with insufficient information to code with codable children: Secondary | ICD-10-CM

## 2014-11-16 DIAGNOSIS — E1165 Type 2 diabetes mellitus with hyperglycemia: Secondary | ICD-10-CM

## 2014-11-20 ENCOUNTER — Other Ambulatory Visit: Payer: Self-pay

## 2014-11-20 ENCOUNTER — Ambulatory Visit
Admission: RE | Admit: 2014-11-20 | Discharge: 2014-11-20 | Disposition: A | Discharge status: Home / Self Care | Payer: 59 | Source: Ambulatory Visit

## 2014-11-20 DIAGNOSIS — Z1231 Encounter for screening mammogram for malignant neoplasm of breast: Secondary | ICD-10-CM

## 2014-11-21 ENCOUNTER — Telehealth: Payer: BLUE CROSS/BLUE SHIELD | Admitting: Nurse Practitioner

## 2014-11-21 DIAGNOSIS — M545 Low back pain, unspecified: Secondary | ICD-10-CM

## 2014-11-21 MED ORDER — CYCLOBENZAPRINE HCL 10 MG PO TABS: 10.0000 mg | ORAL_TABLET | Freq: Three times a day (TID) | ORAL | Status: DC | PRN

## 2014-11-21 MED ORDER — ETODOLAC 300 MG PO CAPS: 300.0000 mg | ORAL_CAPSULE | Freq: Three times a day (TID) | ORAL | Status: DC

## 2014-11-21 NOTE — Progress Notes (Signed)
We are sorry that you are not feeling well.  Here is how we plan to help!  Based on what you have shared with me it looks like you mostly have acute back pain.  Acute back pain is defined as musculoskeletal pain that can resolve in 1-3 weeks with conservative treatment.  I have prescribed Etodolac 300 mg twice a day non-steroid anti-inflammatory (NSAID) as well as Flexeril 10 mg every eight hours as needed which is a muscle relaxer.  Some patients experience stomach irritation or in increased heartburn with anti-inflammatory drugs.  Please keep in mind that muscle relaxer's can cause fatigue and should not be taken while at work or driving.  Back pain is very common.  The pain often gets better over time.  The cause of back pain is usually not dangerous.  Most people can learn to manage their back pain on their own.  Home Care  Stay active.  Start with short walks on flat ground if you can.  Try to walk farther each day.  Do not sit, drive or stand in one place for more than 30 minutes.  Do not stay in bed.  Do not avoid exercise or work.  Activity can help your back heal faster.  Be careful when you bend or lift an object.  Bend at your knees, keep the object close to you, and do not twist.  Sleep on a firm mattress.  Lie on your side, and bend your knees.  If you lie on your back, put a pillow under your knees.  Only take medicines as told by your doctor.  Put ice on the injured area.  Put ice in a plastic bag  Place a towel between your skin and the bag  Leave the ice on for 15-20 minutes, 3-4 times a day for the first 2-3 days.  After that, you can switch between ice and heat packs.  Ask your doctor about back exercises or massage.  Avoid feeling anxious or stressed.  Find good ways to deal with stress, such as exercise.  Get Help Right Way If:  Your pain does not go away with rest or medicine.  Your pain does not go away in 1 week.  You have new problems.  You do not  feel well.  The pain spreads into your legs.  You cannot control when you poop (bowel movement) or pee (urinate)  You feel sick to your stomach (nauseous) or throw up (vomit)  You have belly (abdominal) pain.  You feel like you may pass out (faint).  If you develop a fever.  Make Sure you:  Understand these instructions.  Will watch your condition  Will get help right away if you are not doing well or get worse.  Your e-visit answers were reviewed by a board certified advanced clinical practitioner to complete your personal care plan.  Depending on the condition, your plan could have included both over the counter or prescription medications.  If there is a problem please reply  once you have received a response from your provider.  Your safety is important to us.  If you have drug allergies check your prescription carefully.    You can use MyChart to ask questions about today's visit, request a non-urgent call back, or ask for a work or school excuse.  You will get an e-mail in the next two days asking about your experience.  I hope that your e-visit has been valuable and will speed your recovery. Thank you   for using e-visits.   

## 2014-11-23 ENCOUNTER — Encounter: Payer: Self-pay | Admitting: Family Medicine

## 2014-12-01 ENCOUNTER — Other Ambulatory Visit: Payer: Self-pay | Admitting: Family Medicine

## 2014-12-01 NOTE — Telephone Encounter (Signed)
Med filled until Endo appt on 12/2014

## 2014-12-01 NOTE — Telephone Encounter (Signed)
Med filled.  

## 2014-12-14 ENCOUNTER — Encounter: Payer: Self-pay | Admitting: Family Medicine

## 2015-01-03 ENCOUNTER — Encounter: Payer: Self-pay | Admitting: Family Medicine

## 2015-01-09 ENCOUNTER — Ambulatory Visit (INDEPENDENT_AMBULATORY_CARE_PROVIDER_SITE_OTHER): Payer: 59 | Admitting: Internal Medicine

## 2015-01-09 ENCOUNTER — Encounter: Payer: Self-pay | Admitting: Internal Medicine

## 2015-01-09 VITALS — BP 126/80 | HR 72 | Temp 98.4°F | Resp 12 | Wt 345.0 lb

## 2015-01-09 DIAGNOSIS — E1165 Type 2 diabetes mellitus with hyperglycemia: Secondary | ICD-10-CM

## 2015-01-09 MED ORDER — LIRAGLUTIDE 18 MG/3ML ~~LOC~~ SOPN: 1.8000 mg | PEN_INJECTOR | Freq: Every day | SUBCUTANEOUS | Status: DC

## 2015-01-09 NOTE — Patient Instructions (Signed)
Please continue: - Metformin 1000 mg 2x a day, with meals - Lantus 60 units at bedtime - Amaryl 4 mg 2x a day  Please increase: - Victoza to 1.8 mg daily   Please let me know if the sugars are consistently <80 or >200.  Continue to check sugars 2x a day, rotating check times.  Please return in 3 months with your sugar log.   PATIENT INSTRUCTIONS FOR TYPE 2 DIABETES:  **Please join MyChart!** - see attached instructions about how to join if you have not done so already.  DIET AND EXERCISE Diet and exercise is an important part of diabetic treatment.  We recommended aerobic exercise in the form of brisk walking (working between 40-60% of maximal aerobic capacity, similar to brisk walking) for 150 minutes per week (such as 30 minutes five days per week) along with 3 times per week performing 'resistance' training (using various gauge rubber tubes with handles) 5-10 exercises involving the major muscle groups (upper body, lower body and core) performing 10-15 repetitions (or near fatigue) each exercise. Start at half the above goal but build slowly to reach the above goals. If limited by weight, joint pain, or disability, we recommend daily walking in a swimming pool with water up to waist to reduce pressure from joints while allow for adequate exercise.    BLOOD GLUCOSES Monitoring your blood glucoses is important for continued management of your diabetes. Please check your blood glucoses 2-4 times a day: fasting, before meals and at bedtime (you can rotate these measurements - e.g. one day check before the 3 meals, the next day check before 2 of the meals and before bedtime, etc.).   HYPOGLYCEMIA (low blood sugar) Hypoglycemia is usually a reaction to not eating, exercising, or taking too much insulin/ other diabetes drugs.  Symptoms include tremors, sweating, hunger, confusion, headache, etc. Treat IMMEDIATELY with 15 grams of Carbs: . 4 glucose tablets .  cup regular juice/soda . 2  tablespoons raisins . 4 teaspoons sugar . 1 tablespoon honey Recheck blood glucose in 15 mins and repeat above if still symptomatic/blood glucose <100.  RECOMMENDATIONS TO REDUCE YOUR RISK OF DIABETIC COMPLICATIONS: * Take your prescribed MEDICATION(S) * Follow a DIABETIC diet: Complex carbs, fiber rich foods, (monounsaturated and polyunsaturated) fats * AVOID saturated/trans fats, high fat foods, >2,300 mg salt per day. * EXERCISE at least 5 times a week for 30 minutes or preferably daily.  * DO NOT SMOKE OR DRINK more than 1 drink a day. * Check your FEET every day. Do not wear tightfitting shoes. Contact us if you develop an ulcer * See your EYE doctor once a year or more if needed * Get a FLU shot once a year * Get a PNEUMONIA vaccine once before and once after age 4 years  GOALS:  * Your Hemoglobin A1c of <7%  * fasting sugars need to be <130 * after meals sugars need to be <180 (2h after you start eating) * Your Systolic BP should be 140 or lower  * Your Diastolic BP should be 80 or lower  * Your HDL (Good Cholesterol) should be 40 or higher  * Your LDL (Bad Cholesterol) should be 100 or lower. * Your Triglycerides should be 150 or lower  * Your Urine microalbumin (kidney function) should be <30 * Your Body Mass Index should be 25 or lower    Please consider the following ways to cut down carbs and fat and increase fiber and micronutrients in your diet: -  substitute whole grain for white bread or pasta - substitute brown rice for white rice - substitute 90-calorie flat bread pieces for slices of bread when possible - substitute sweet potatoes or yams for white potatoes - substitute humus for margarine - substitute tofu for cheese when possible - substitute almond or rice milk for regular milk (would not drink soy milk daily due to concern for soy estrogen influence on breast cancer risk) - substitute dark chocolate for other sweets when possible - substitute water - can  add lemon or orange slices for taste - for diet sodas (artificial sweeteners will trick your body that you can eat sweets without getting calories and will lead you to overeating and weight gain in the long run) - do not skip breakfast or other meals (this will slow down the metabolism and will result in more weight gain over time)  - can try smoothies made from fruit and almond/rice milk in am instead of regular breakfast - can also try old-fashioned (not instant) oatmeal made with almond/rice milk in am - order the dressing on the side when eating salad at a restaurant (pour less than half of the dressing on the salad) - eat as little meat as possible - can try juicing, but should not forget that juicing will get rid of the fiber, so would alternate with eating raw veg./fruits or drinking smoothies - use as little oil as possible, even when using olive oil - can dress a salad with a mix of balsamic vinegar and lemon juice, for e.g. - use agave nectar, stevia sugar, or regular sugar rather than artificial sweateners - steam or broil/roast veggies  - snack on veggies/fruit/nuts (unsalted, preferably) when possible, rather than processed foods - reduce or eliminate aspartame in diet (it is in diet sodas, chewing gum, etc) Read the labels!  Try to read Dr. Katherina Right book: "Program for Reversing Diabetes" for other ideas for healthy eating.

## 2015-01-09 NOTE — Progress Notes (Signed)
Patient ID: [name], female   DOB: 01/10/1971, 44 y.o.   MRN: [id number]  HPI: [name] is a 44 y.o.-year-old female, referred by her PCP, Dr. Beverely Low, for management of DM2, dx in 2001, insulin-dependent 2007, uncontrolled, without complications.  Last hemoglobin A1c was: Lab Results  Component Value Date   HGBA1C 11.0* 06/21/2014   HGBA1C 8.8* 01/03/2014   HGBA1C 9.6* 04/12/2013  She had 2 courses of Prednisone this past winter.  Since last HbA1c, she started Weight watchers 8 weeks ago >> lost 10 lbs!  Pt is on a regimen of: - Metformin 1000 mg 2x a day, with meals - Lantus 60 units at bedtime - Victoza 1.2 mg daily (tried 1.8 mg >> low CBGs) - Amaryl 4 mg 2x a day She had frequent yeast inf when sugars were higher before.   Pt checks her sugars 2x a day and they are: - am: 120-130 - 2h after b'fast: n/c - before lunch: n/c - 2h after lunch: n/c - before dinner: n/c - 2h after dinner: n/c - bedtime: 107, 125-180 when having a good dinner; if goes out: 250-260 - nighttime: n/c No lows. Lowest sugar was 89; she has hypoglycemia awareness at 105.  Highest sugar was 400-500.  Glucometer: One Touch Ultra 2  Pt's meals are: - Breakfast: 2 eggs + English muffin or waffle - Lunch: salad + frozen dinner (low carb); chicken + green beans; sandwich + veggies - Dinner: meat + veggies + starch (1 serving) - Snacks: protein shakes - 4g carbs (160 cal) - 2x a week; popsicle after dinner; apple sauce    - no CKD, last BUN/creatinine:  Lab Results  Component Value Date   BUN 17 06/21/2014   CREATININE 0.63 06/21/2014  On Lisinopril. - last set of lipids: Lab Results  Component Value Date   CHOL 202* 06/21/2014   HDL 59.70 06/21/2014   LDLCALC 108* 01/03/2014   LDLDIRECT 112.0 06/21/2014   TRIG 202.0* 06/21/2014   CHOLHDL 3 06/21/2014  On Atorvastatin.  - last eye exam was 2014. No DR. Dr Emily Filbert.  - no numbness and tingling in her feet. She had slight sxs 4-5 mo ago.   On ASA 81.   Pt has FH of DM in mother, MGM, PGF, brother, maternal great uncle.  ROS: Constitutional: + weight loss, no fatigue, + subjective hyperthermia (hot flushes) Eyes:+ blurry vision, no xerophthalmia ENT: no sore throat, no nodules palpated in throat, no dysphagia/odynophagia, no hoarseness, + hypoacusis, + tinnitus Cardiovascular: no CP/+ SOB/+ palpitations/+ leg swelling Respiratory: no cough/+ SOB Gastrointestinal: no N/V/D/C, + heartburn Musculoskeletal: no muscle/joint aches Skin: no rashes Neurological: no tremors/numbness/tingling/dizziness Psychiatric: + both: depression/anxiety (bipolar disorder) + low libido  Past Medical History  Diagnosis Date  . Asthma     pt stated treated as ashtma but not really asthma  . Chicken pox   . Depression   . Diabetes mellitus   . GERD (gastroesophageal reflux disease)   . Allergy   . Hypertension     readings  . Hyperlipidemia   . Migraine   . Irregular heartbeat   . Sleep apnea   . Bipolar disorder    Past Surgical History  Procedure Laterality Date  . Cesarean section      1995  . Thyroidectomy, partial  2001    removed left  . Laparoscopic assisted vaginal hysterectomy  2009      BSO fibroids, DUB, pelvic pain  . Back surgery  1999  . Ankle  surgery  1989    left  . Umbilical hernia repair  2003  . Oophorectomy Bilateral 2009    cyst   Social History   Social History  . Marital Status: Married    Spouse Name: N/A  . Number of Children: 1   Occupational History  . Registrar, Administrator   Social History Main Topics  . Smoking status: Never Smoker   . Smokeless tobacco: Never Used  . Alcohol Use: No  . Drug Use: No   Current Outpatient Prescriptions on File Prior to Visit  Medication Sig Dispense Refill  . albuterol (PROVENTIL HFA) 108 (90 BASE) MCG/ACT inhaler INHALE 2 PUFFS INTO THE LUNGS EVERY 4 HOURS AS NEEDED FOR WHEEZING OR SHORTNESS OF BREATH 6.7 g 3  . amoxicillin-clavulanate  (AUGMENTIN) 875-125 MG per tablet Take 1 tablet by mouth 2 (two) times daily. 20 tablet 0  . aspirin 81 MG tablet Take 160 mg by mouth daily.    [name] atorvastatin (LIPITOR) 40 MG tablet Take 1 tablet (40 mg total) by mouth daily. 90 tablet 1  . B-D UF III MINI PEN NEEDLES 31G X 5 MM MISC USE AS DIRECTED 100 each 1  . benzonatate (TESSALON) 100 MG capsule Take 1 capsule (100 mg total) by mouth 3 (three) times daily as needed. 21 capsule 0  . Calcium Carbonate-Vitamin D (CALTRATE 600+D PO) Take 1 tablet by mouth 2 (two) times daily.    . carbamazepine (TEGRETOL XR) 200 MG 12 hr tablet Take 200 mg by mouth. 1 tablet in the morning, 2 at night    . carvedilol (COREG) 25 MG tablet TAKE 1 TABLET BY MOUTH TWICE DAILY 180 tablet 0  . cholecalciferol (VITAMIN D) 1000 UNITS tablet Take 2,000 Units by mouth daily.    . clobetasol ointment (TEMOVATE) 0.05 % Apply 1 application topically 2 (two) times daily. Do not use for more than 7 days 60 g 0  . CLOBEX SPRAY 0.05 % external spray as directed.    . cyclobenzaprine (FLEXERIL) 10 MG tablet Take 1 tablet (10 mg total) by mouth 3 (three) times daily as needed for muscle spasms. 30 tablet 0  . DERMOTIC 0.01 % OIL as directed.    [name] estradiol (ESTRACE) 1 MG tablet TAKE 1 TABLET BY MOUTH TWICE A DAY 180 tablet 3  . etodolac (LODINE) 300 MG capsule Take 1 capsule (300 mg total) by mouth every 8 (eight) hours. 30 capsule 1  . fenofibrate 160 MG tablet Take 1 tablet (160 mg total) by mouth daily. 30 tablet 5  . fluticasone (FLONASE) 50 MCG/ACT nasal spray Place 2 sprays into the nose daily. 16 g 2  . fluticasone (FLOVENT HFA) 110 MCG/ACT inhaler Inhale 2 puffs into the lungs 2 (two) times daily. 1 Inhaler 12  . gabapentin (NEURONTIN) 600 MG tablet Take 600 mg by mouth 3 (three) times daily.    [name] glimepiride (AMARYL) 4 MG tablet Take 1 tablet (4 mg total) by mouth 2 (two) times daily. 60 tablet 5  . hydrochlorothiazide (HYDRODIURIL) 25 MG tablet Take 1 tablet (25 mg  total) by mouth daily. 90 tablet 1  . hydrOXYzine (VISTARIL) 50 MG capsule Take 50 mg by mouth every 4 (four) hours as needed.    . lamoTRIgine (LAMICTAL) 200 MG tablet Take 200 mg by mouth 2 (two) times daily.    . Lancets 30G MISC Use one lancet each time sugars are checked. Pt tests twice daily. DX E11.9 100 each 2  . LANTUS  SOLOSTAR 100 UNIT/ML Solostar Pen INJECT 60 UNITS SUBCUTANEOUSLY AT BEDTIME 15 mL 1  . Liraglutide (VICTOZA) 18 MG/3ML SOPN Inject 0.2 mLs (1.2 mg total) into the skin daily. 6 mL 3  . lisinopril (PRINIVIL,ZESTRIL) 20 MG tablet TAKE 1 TABLET BY MOUTH EVERY DAY 90 tablet 1  . meloxicam (MOBIC) 15 MG tablet   1  . metFORMIN (GLUCOPHAGE) 1000 MG tablet Take 1 tablet (1,000 mg total) by mouth 2 (two) times daily with a meal. 60 tablet 5  . nystatin cream (MYCOSTATIN) Apply 1 application topically 2 (two) times daily. Apply to affected area BID for up to 7 days then prn 30 g 2  . omeprazole (PRILOSEC) 40 MG capsule TAKE 1 CAPSULE BY MOUTH TWICE DAILY 60 capsule 6  . ONE TOUCH ULTRA TEST test strip TEST TWICE DAILY 100 each 2  . QVAR 80 MCG/ACT inhaler Inhale 2 puffs into the lungs 2 (two) times daily. 1 Inhaler 6  . SUMAtriptan (IMITREX) 100 MG tablet Take 1tab at earliest onset of headache.  May repeat x1 in 2 hours if headache persists or recurs. 9 tablet 0  . tacrolimus (PROTOPIC) 0.1 % ointment as directed.    . tiaGABine (GABITRIL) 4 MG tablet Take 8 mg by mouth 2 (two) times daily.    [name] trimethoprim-polymyxin b (POLYTRIM) ophthalmic solution Place 1 drop into both eyes every 4 (four) hours. for 5 days 10 mL 0   No current facility-administered medications on file prior to visit.   No Known Allergies Family History  Problem Relation Age of Onset  . Hyperlipidemia Mother   . Diabetes Mother   . Anxiety disorder Mother   . Heart disease Mother   . Hypertension Father   . Lupus Father   . Heart disease Father   . Stroke Maternal Aunt   . Stroke Paternal Grandfather    . Mental illness Paternal Grandfather    PE: BP 126/80 mmHg  Pulse 72  Temp(Src) 98.4 F (36.9 C) (Oral)  Resp 12  Wt 345 lb (156.491 kg)  SpO2 95%  LMP 01/25/2008 Wt Readings from Last 3 Encounters:  01/09/15 345 lb (156.491 kg)  10/31/14 355 lb (161.027 kg)  06/23/14 352 lb 3.2 oz (159.757 kg)   Constitutional: overweight, in NAD Eyes: PERRLA, EOMI, no exophthalmos ENT: moist mucous membranes, no thyromegaly, no cervical lymphadenopathy Cardiovascular: RRR, No MRG Respiratory: CTA B Gastrointestinal: abdomen soft, NT, ND, BS+ Musculoskeletal: no deformities, strength intact in all 4 Skin: moist, warm, no rashes Neurological: no tremor with outstretched hands, DTR normal in all 4  ASSESSMENT: 1. DM2, insulin-dependent, uncontrolled, without complications  PLAN:  1. Patient with long-standing, uncontrolled diabetes, on oral antidiabetic regimen + basal insulin, with now improving control after starting Weight watchers. Sugars not far from goal.  - We discussed about options for treatment, and I suggested to increase Victoza for now. We may need Invokana at next visit so that we can decrease Amaryl and maybe also Lantus:  Patient Instructions  Please continue: - Metformin 1000 mg 2x a day, with meals - Lantus 60 units at bedtime - Amaryl 4 mg 2x a day  Please increase: - Victoza to 1.8 mg daily   Please let me know if the sugars are consistently <80 or >200.  Continue to check sugars 2x a day, rotating check times.  Please return in 3 months with your sugar log.   - Strongly advised her to start checking sugars at different times of the day -  check 2 times a day, rotating checks - given sugar log and advised how to fill it and to bring it at next appt  - given foot care handout and explained the principles  - given instructions for hypoglycemia management "15-15 rule"  - advised for yearly eye exams - check HbA1c today >> 8.8% (large decrease) - Return to clinic  in 3 mo with sugar log

## 2015-01-14 ENCOUNTER — Encounter: Payer: Self-pay | Admitting: Internal Medicine

## 2015-01-15 ENCOUNTER — Other Ambulatory Visit: Payer: Self-pay | Admitting: Family Medicine

## 2015-01-15 ENCOUNTER — Other Ambulatory Visit: Payer: Self-pay | Admitting: Internal Medicine

## 2015-01-15 DIAGNOSIS — E1165 Type 2 diabetes mellitus with hyperglycemia: Secondary | ICD-10-CM

## 2015-01-15 MED ORDER — LIRAGLUTIDE 18 MG/3ML ~~LOC~~ SOPN: 1.8000 mg | PEN_INJECTOR | Freq: Every day | SUBCUTANEOUS | Status: DC

## 2015-01-15 NOTE — Telephone Encounter (Signed)
Medication filled to pharmacy as requested.   

## 2015-01-23 ENCOUNTER — Encounter: Payer: Self-pay | Admitting: Internal Medicine

## 2015-01-24 ENCOUNTER — Ambulatory Visit (INDEPENDENT_AMBULATORY_CARE_PROVIDER_SITE_OTHER): Payer: 59 | Admitting: Family Medicine

## 2015-01-24 ENCOUNTER — Other Ambulatory Visit: Payer: Self-pay | Admitting: *Deleted

## 2015-01-24 ENCOUNTER — Other Ambulatory Visit: Payer: Self-pay | Admitting: Family Medicine

## 2015-01-24 ENCOUNTER — Encounter: Payer: Self-pay | Admitting: Family Medicine

## 2015-01-24 VITALS — BP 126/80 | HR 86 | Temp 98.0°F | Resp 16 | Wt 340.4 lb

## 2015-01-24 DIAGNOSIS — H6123 Impacted cerumen, bilateral: Secondary | ICD-10-CM | POA: Diagnosis not present

## 2015-01-24 DIAGNOSIS — J302 Other seasonal allergic rhinitis: Secondary | ICD-10-CM | POA: Diagnosis not present

## 2015-01-24 DIAGNOSIS — I10 Essential (primary) hypertension: Secondary | ICD-10-CM

## 2015-01-24 DIAGNOSIS — E782 Mixed hyperlipidemia: Secondary | ICD-10-CM | POA: Diagnosis not present

## 2015-01-24 MED ORDER — INSULIN GLARGINE 100 UNIT/ML SOLOSTAR PEN: PEN_INJECTOR | SUBCUTANEOUS | Status: DC

## 2015-01-24 MED ORDER — MECLIZINE HCL 50 MG PO TABS: 50.0000 mg | ORAL_TABLET | Freq: Three times a day (TID) | ORAL | Status: DC | PRN

## 2015-01-24 MED ORDER — FLUTICASONE PROPIONATE 50 MCG/ACT NA SUSP: 2.0000 | Freq: Every day | NASAL | Status: DC

## 2015-01-24 NOTE — Assessment & Plan Note (Signed)
Deteriorated.  Restart nasal steroid spray and Zyrtec daily.  Reviewed supportive care and red flags that should prompt return.  Pt expressed understanding and is in agreement w/ plan.

## 2015-01-24 NOTE — Assessment & Plan Note (Signed)
Bilateral.  Unable to curette or irrigate.  Due to pt's hx of impaction and subsequent fungal infxn, will refer to ENT for complete evaluation and tx.

## 2015-01-24 NOTE — Assessment & Plan Note (Signed)
Chronic problem.  Tolerating statin w/o difficulty.  Applauded pt's decision to rejoin [employer].  Check labs.  Adjust meds prn.

## 2015-01-24 NOTE — Progress Notes (Signed)
   Subjective:    Patient ID: [name], female    DOB: 1970-07-11, 44 y.o.   MRN: [id number]  HPI Ear fullness- pt reports she got sick in June and has had difficulty w/ ear fullness and decreased hearing since then.  Has sensation of fluid in ears.  Some allergy sxs- sneezing, nasal congestion.  Pt will have vertigo w/ sudden movements.  Denies ear pain, drainage  HTN- well controlled.  On Lisinopril, Coreg, HCTZ.  Pt has rejoined [employer] and has lost 5 lbs in just 2 weeks.  No CP, SOB, HAs, visual changes, edema.  Hyperlipidemia- chronic problem, on Lipitor and Fenofibrate.  Denies abd pain, N/V, myalgias.   Review of Systems For ROS see HPI     Objective:   Physical Exam  Constitutional: She is oriented to person, place, and time. She appears well-developed and well-nourished. No distress.  obese  HENT:  Head: Normocephalic and atraumatic.  TMs obscured by dry, hard wax bilaterally.  Attempts at curette were unsuccessful.  Irrigation attempts were similiarly unsuccessful  Eyes: Conjunctivae and EOM are normal. Pupils are equal, round, and reactive to light.  Neck: Normal range of motion. Neck supple. No thyromegaly present.  Cardiovascular: Normal rate, regular rhythm, normal heart sounds and intact distal pulses.   No murmur heard. Pulmonary/Chest: Effort normal and breath sounds normal. No respiratory distress.  Abdominal: Soft. She exhibits no distension. There is no tenderness.  Musculoskeletal: She exhibits no edema.  Lymphadenopathy:    She has no cervical adenopathy.  Neurological: She is alert and oriented to person, place, and time.  Skin: Skin is warm and dry.  Psychiatric: She has a normal mood and affect. Her behavior is normal.  Vitals reviewed.         Assessment & Plan:

## 2015-01-24 NOTE — Patient Instructions (Signed)
Schedule your complete physical in 6 months We'll notify you of your lab results and make any changes if needed Start daily Zyrtec and Flonase for the allergy congestion Meclizine if you have Vertigo (only as needed) Drink plenty of fluids We'll call you with your ENT appt Call with any questions or concerns Hang in there!

## 2015-01-24 NOTE — Assessment & Plan Note (Signed)
Chronic problem.  Well controlled.  Asymptomatic at this time.  Check labs.  No anticipated med changes.  Will follow. 

## 2015-01-24 NOTE — Telephone Encounter (Signed)
Medication filled to pharmacy as requested.   

## 2015-01-24 NOTE — Progress Notes (Signed)
Pre visit review using our clinic review tool, if applicable. No additional management support is needed unless otherwise documented below in the visit note. 

## 2015-01-25 LAB — BASIC METABOLIC PANEL
BUN: 22 mg/dL (ref 6–23)
CO2: 31 mEq/L (ref 19–32)
Calcium: 10.3 mg/dL (ref 8.4–10.5)
Chloride: 102 mEq/L (ref 96–112)
Creatinine, Ser: 0.79 mg/dL (ref 0.40–1.20)
GFR: 84.35 mL/min (ref >60.00)
Glucose, Bld: 95 mg/dL (ref 70–99)
POTASSIUM: 4.3 meq/L (ref 3.5–5.1)
SODIUM: 142 meq/L (ref 135–145)

## 2015-01-25 LAB — HEPATIC FUNCTION PANEL
ALK PHOS: 54 U/L (ref 39–117)
ALT: 21 U/L (ref 0–35)
AST: 22 U/L (ref 0–37)
Albumin: 4.1 g/dL (ref 3.5–5.2)
BILIRUBIN TOTAL: 0.2 mg/dL (ref 0.2–1.2)
Bilirubin, Direct: 0 mg/dL (ref 0.0–0.3)
Total Protein: 7.4 g/dL (ref 6.0–8.3)

## 2015-01-25 LAB — CBC WITH DIFFERENTIAL/PLATELET
BASOS PCT: 0.8 % (ref 0.0–3.0)
Basophils Absolute: 0.1 10*3/uL (ref 0.0–0.1)
EOS ABS: 0.3 10*3/uL (ref 0.0–0.7)
Eosinophils Relative: 3.5 % (ref 0.0–5.0)
HCT: 41.1 % (ref 36.0–46.0)
HEMOGLOBIN: 13.5 g/dL (ref 12.0–15.0)
Lymphocytes Relative: 31.7 % (ref 12.0–46.0)
Lymphs Abs: 3 10*3/uL (ref 0.7–4.0)
MCHC: 32.8 g/dL (ref 30.0–36.0)
MCV: 86.4 fl (ref 78.0–100.0)
Monocytes Absolute: 0.5 10*3/uL (ref 0.1–1.0)
Monocytes Relative: 5.3 % (ref 3.0–12.0)
Neutro Abs: 5.6 10*3/uL (ref 1.4–7.7)
Neutrophils Relative %: 58.7 % (ref 43.0–77.0)
Platelets: 335 10*3/uL (ref 150.0–400.0)
RBC: 4.76 Mil/uL (ref 3.87–5.11)
RDW: 14 % (ref 11.5–15.5)
WBC: 9.5 10*3/uL (ref 4.0–10.5)

## 2015-01-25 LAB — LIPID PANEL
Cholesterol: 198 mg/dL (ref 0–200)
HDL: 49.9 mg/dL (ref >39.00)
LDL Cholesterol: 115 mg/dL — ABNORMAL HIGH (ref 0–99)
NONHDL: 147.64
Total CHOL/HDL Ratio: 4
Triglycerides: 162 mg/dL — ABNORMAL HIGH (ref 0.0–149.0)
VLDL: 32.4 mg/dL (ref 0.0–40.0)

## 2015-02-09 ENCOUNTER — Other Ambulatory Visit: Payer: Self-pay | Admitting: Family Medicine

## 2015-02-09 MED ORDER — CARVEDILOL 25 MG PO TABS: 25.0000 mg | ORAL_TABLET | Freq: Two times a day (BID) | ORAL | Status: DC

## 2015-03-20 ENCOUNTER — Other Ambulatory Visit: Payer: Self-pay | Admitting: *Deleted

## 2015-03-20 ENCOUNTER — Encounter: Payer: Self-pay | Admitting: Internal Medicine

## 2015-03-20 MED ORDER — INSULIN PEN NEEDLE 31G X 5 MM MISC: Status: DC

## 2015-03-20 MED ORDER — GLIMEPIRIDE 4 MG PO TABS: 4.0000 mg | ORAL_TABLET | Freq: Two times a day (BID) | ORAL | Status: DC

## 2015-04-02 ENCOUNTER — Other Ambulatory Visit: Payer: Self-pay | Admitting: *Deleted

## 2015-04-02 ENCOUNTER — Encounter: Payer: Self-pay | Admitting: Internal Medicine

## 2015-04-02 ENCOUNTER — Other Ambulatory Visit: Payer: Self-pay | Admitting: Family Medicine

## 2015-04-02 MED ORDER — METFORMIN HCL 1000 MG PO TABS: 1000.0000 mg | ORAL_TABLET | Freq: Two times a day (BID) | ORAL | Status: DC

## 2015-04-02 NOTE — Telephone Encounter (Signed)
Medication filled to pharmacy as requested.   

## 2015-04-12 ENCOUNTER — Ambulatory Visit: Payer: 59 | Admitting: Internal Medicine

## 2015-05-04 ENCOUNTER — Telehealth: Payer: 59 | Admitting: Family

## 2015-05-04 DIAGNOSIS — J029 Acute pharyngitis, unspecified: Secondary | ICD-10-CM

## 2015-05-04 MED ORDER — BENZONATATE 100 MG PO CAPS: 100.0000 mg | ORAL_CAPSULE | Freq: Three times a day (TID) | ORAL | Status: DC | PRN

## 2015-05-04 MED ORDER — AZITHROMYCIN 250 MG PO TABS: ORAL_TABLET | ORAL | Status: DC

## 2015-05-04 NOTE — Progress Notes (Signed)

## 2015-05-10 ENCOUNTER — Other Ambulatory Visit: Payer: Self-pay | Admitting: Family Medicine

## 2015-05-10 NOTE — Telephone Encounter (Signed)
Medication filled to pharmacy as requested.   

## 2015-05-14 ENCOUNTER — Encounter: Payer: 59 | Admitting: Family Medicine

## 2015-05-22 ENCOUNTER — Ambulatory Visit: Payer: 59 | Admitting: Internal Medicine

## 2015-05-30 ENCOUNTER — Telehealth: Payer: 59 | Admitting: Family

## 2015-05-30 DIAGNOSIS — N3000 Acute cystitis without hematuria: Secondary | ICD-10-CM | POA: Diagnosis not present

## 2015-05-30 MED ORDER — SULFAMETHOXAZOLE-TRIMETHOPRIM 800-160 MG PO TABS: 1.0000 | ORAL_TABLET | Freq: Two times a day (BID) | ORAL | Status: DC

## 2015-05-30 MED FILL — SULFAMETHOXAZOLE/TMP DS TAB: 800-160 | 5 days supply | Qty: 10 | Fill #0

## 2015-05-30 NOTE — Progress Notes (Signed)

## 2015-05-31 ENCOUNTER — Ambulatory Visit (INDEPENDENT_AMBULATORY_CARE_PROVIDER_SITE_OTHER): Payer: 59 | Admitting: Internal Medicine

## 2015-05-31 ENCOUNTER — Other Ambulatory Visit: Payer: 59 | Admitting: *Deleted

## 2015-05-31 ENCOUNTER — Encounter: Payer: Self-pay | Admitting: Internal Medicine

## 2015-05-31 ENCOUNTER — Other Ambulatory Visit (INDEPENDENT_AMBULATORY_CARE_PROVIDER_SITE_OTHER): Payer: 59 | Admitting: *Deleted

## 2015-05-31 VITALS — BP 118/68 | HR 76 | Temp 97.9°F | Resp 12 | Wt 353.8 lb

## 2015-05-31 DIAGNOSIS — E1165 Type 2 diabetes mellitus with hyperglycemia: Secondary | ICD-10-CM | POA: Diagnosis not present

## 2015-05-31 DIAGNOSIS — Z794 Long term (current) use of insulin: Secondary | ICD-10-CM | POA: Diagnosis not present

## 2015-05-31 LAB — POCT GLYCOSYLATED HEMOGLOBIN (HGB A1C)
Hemoglobin A1C: 8.8
Hemoglobin A1C: 10.4

## 2015-05-31 NOTE — Progress Notes (Signed)
Patient ID: [name], female   DOB: [birth year], 45 y.o.   MRN: [id number]  HPI: Crystal Guadeloupeammy Seward is a 45 y.o.-year-old female, returning for f/u for DM2, dx in 2001, insulin-dependent 2007, uncontrolled, without complications. Last visit 5 mo ago.  Since last visit, she gained 13 lbs and her sugars are much higher. She also did not check sugars frequently. She is now back on Abilify - started ~2 mo ago >> helps. She is now also back in [employer].   Last hemoglobin A1c was: Lab Results  Component Value Date   HGBA1C 8.8 01/09/2015   HGBA1C 11.0* 06/21/2014   HGBA1C 8.8* 01/03/2014   Since last HbA1c, she started [employer] 8 weeks ago >> lost 10 lbs!  Pt is on a regimen of: - Metformin 1000 mg 2x a day, with meals - Lantus 63 units at bedtime - Victoza 1.2 mg daily (tried 1.8 mg >> low CBGs) >> re-increased to 1.8 mg in 12/2014 - Amaryl 4 mg 2x a day She had frequent yeast inf when sugars were higher before.   Pt does not check sugars - reviewed the ones at last visit - 250-350 now. - am: 120-130 - 2h after b'fast: n/c - before lunch: n/c - 2h after lunch: n/c - before dinner: n/c - 2h after dinner: n/c - bedtime: 107, 125-180 when having a good dinner; if goes out: 250-260 - nighttime: n/c No lows. Lowest sugar was 89 >> 99; she has hypoglycemia awareness at 105.  Highest sugar was 400-500 >> 300s.  Glucometer: One Touch Ultra 2  Pt's meals are: - Breakfast: 2 eggs + English muffin or waffle - Lunch: salad + frozen dinner (low carb); chicken + green beans; sandwich + veggies - Dinner: meat + veggies + starch (1 serving) - Snacks: protein shakes - 4g carbs (160 cal) - 2x a week; popsicle after dinner; apple sauce    - no CKD, last BUN/creatinine:  Lab Results  Component Value Date   BUN 22 01/24/2015   CREATININE 0.79 01/24/2015  On Lisinopril. - last set of lipids: Lab Results  Component Value Date   CHOL 198 01/24/2015   HDL 49.90 01/24/2015   LDLCALC  115* 01/24/2015   LDLDIRECT 112.0 06/21/2014   TRIG 162.0* 01/24/2015   CHOLHDL 4 01/24/2015  On Atorvastatin.  - last eye exam was 2014. No DR. Dr Emily FilbertGould.  - no numbness and tingling in her feet. She had slight sxs 4-5 mo ago.  On ASA 81.   ROS: Constitutional: + weight gain, no fatigue, + subjective hyperthermia (hot flushes) Eyes:+ blurry vision, no xerophthalmia ENT: no sore throat, no nodules palpated in throat, no dysphagia/odynophagia, no hoarseness, + hypoacusis, + tinnitus Cardiovascular: no CP/+ SOB/no palpitations/+ leg swelling Respiratory: no cough/+ SOB Gastrointestinal: no N/V/D/+ C, no heartburn Musculoskeletal: no muscle/joint aches Skin: no rashes Neurological: no tremors/numbness/tingling/dizziness  I reviewed pt's medications, allergies, PMH, social hx, family hx, and changes were documented in the history of present illness. Otherwise, unchanged from my initial visit note.  Past Medical History  Diagnosis Date  . Asthma     pt stated treated as ashtma but not really asthma  . Chicken pox   . Depression   . Diabetes mellitus   . GERD (gastroesophageal reflux disease)   . Allergy   . Hypertension     readings  . Hyperlipidemia   . Migraine   . Irregular heartbeat   . Sleep apnea   . Bipolar disorder (HCC)  Past Surgical History  Procedure Laterality Date  . Cesarean section      1995  . Thyroidectomy, partial  2001    removed left  . Laparoscopic assisted vaginal hysterectomy  2009      BSO fibroids, DUB, pelvic pain  . Back surgery  1999  . Ankle surgery  1989    left  . Umbilical hernia repair  2003  . Oophorectomy Bilateral 2009    cyst   Social History   Social History  . Marital Status: Married    Spouse Name: N/A  . Number of Children: 1   Occupational History  . Registrar, Administrator   Social History Main Topics  . Smoking status: Never Smoker   . Smokeless tobacco: Never Used  . Alcohol Use: No  . Drug Use: No    Current Outpatient Prescriptions on File Prior to Visit  Medication Sig Dispense Refill  . albuterol (PROVENTIL HFA) 108 (90 BASE) MCG/ACT inhaler INHALE 2 PUFFS INTO THE LUNGS EVERY 4 HOURS AS NEEDED FOR WHEEZING OR SHORTNESS OF BREATH 6.7 g 3  . aspirin 81 MG tablet Take 160 mg by mouth daily.    [name] atorvastatin (LIPITOR) 40 MG tablet TAKE 1 TABLET BY MOUTH DAILY 90 tablet 1  . Calcium Carbonate-Vitamin D (CALTRATE 600+D PO) Take 1 tablet by mouth 2 (two) times daily.    . carbamazepine (TEGRETOL XR) 200 MG 12 hr tablet Take 200 mg by mouth. 1 tablet in the morning, 2 at night    . carvedilol (COREG) 25 MG tablet TAKE 1 TABLET BY MOUTH 2 TIMES DAILY. 180 tablet 0  . cholecalciferol (VITAMIN D) 1000 UNITS tablet Take 2,000 Units by mouth daily.    . clobetasol ointment (TEMOVATE) 0.05 % Apply 1 application topically 2 (two) times daily. Do not use for more than 7 days 60 g 0  . CLOBEX SPRAY 0.05 % external spray as directed.    . cyclobenzaprine (FLEXERIL) 10 MG tablet Take 1 tablet (10 mg total) by mouth 3 (three) times daily as needed for muscle spasms. 30 tablet 0  . DERMOTIC 0.01 % OIL as directed.    [name] estradiol (ESTRACE) 1 MG tablet TAKE 1 TABLET BY MOUTH TWICE A DAY 180 tablet 3  . fenofibrate 160 MG tablet TAKE 1 TABLET BY MOUTH DAILY 30 tablet 5  . fluticasone (FLONASE) 50 MCG/ACT nasal spray Place 2 sprays into both nostrils daily. 16 g 2  . fluticasone (FLOVENT HFA) 110 MCG/ACT inhaler Inhale 2 puffs into the lungs 2 (two) times daily. 1 Inhaler 12  . gabapentin (NEURONTIN) 600 MG tablet Take 600 mg by mouth 3 (three) times daily.    [name] glimepiride (AMARYL) 4 MG tablet Take 1 tablet (4 mg total) by mouth 2 (two) times daily. 60 tablet 5  . hydrochlorothiazide (HYDRODIURIL) 25 MG tablet TAKE 1 TABLET (25 MG TOTAL) BY MOUTH DAILY. 90 tablet 1  . hydrOXYzine (VISTARIL) 50 MG capsule Take 50 mg by mouth every 4 (four) hours as needed.    . Insulin Glargine (LANTUS SOLOSTAR) 100 UNIT/ML  Solostar Pen INJECT 60 UNITS SUBCUTANEOUSLY AT BEDTIME (Patient taking differently: Inject 63 Units into the skin daily at 10 pm. INJECT 60 UNITS SUBCUTANEOUSLY AT BEDTIME) 15 mL 3  . Insulin Pen Needle (B-D UF III MINI PEN NEEDLES) 31G X 5 MM MISC Use to inject insulin 2 times daily 180 each 3  . lamoTRIgine (LAMICTAL) 200 MG tablet Take 200 mg by mouth 2 (two)  times daily.    . Lancets 30G MISC Use one lancet each time sugars are checked. Pt tests twice daily. DX E11.9 100 each 2  . Liraglutide (VICTOZA) 18 MG/3ML SOPN Inject 0.3 mLs (1.8 mg total) into the skin daily. 6 mL 3  . lisinopril (PRINIVIL,ZESTRIL) 20 MG tablet TAKE 1 TABLET BY MOUTH DAILY 90 tablet 1  . metFORMIN (GLUCOPHAGE) 1000 MG tablet Take 1 tablet (1,000 mg total) by mouth 2 (two) times daily with a meal. 60 tablet 2  . Multiple Vitamins-Minerals (CENTRUM ULTRA WOMENS PO) Take by mouth.    . nystatin cream (MYCOSTATIN) Apply 1 application topically 2 (two) times daily. Apply to affected area BID for up to 7 days then prn 30 g 2  . omeprazole (PRILOSEC) 40 MG capsule TAKE 1 CAPSULE BY MOUTH TWICE DAILY 60 capsule 6  . ONE TOUCH ULTRA TEST test strip TEST TWICE DAILY 100 each 2  . QVAR 80 MCG/ACT inhaler Inhale 2 puffs into the lungs 2 (two) times daily. 1 Inhaler 6  . sulfamethoxazole-trimethoprim (BACTRIM DS) 800-160 MG tablet Take 1 tablet by mouth 2 (two) times daily. 10 tablet 0  . SUMAtriptan (IMITREX) 100 MG tablet Take 1tab at earliest onset of headache.  May repeat x1 in 2 hours if headache persists or recurs. 9 tablet 0  . tacrolimus (PROTOPIC) 0.1 % ointment as directed.    . tiaGABine (GABITRIL) 4 MG tablet Take 8 mg by mouth 2 (two) times daily.    [name] trimethoprim-polymyxin b (POLYTRIM) ophthalmic solution Place 1 drop into both eyes every 4 (four) hours. for 5 days 10 mL 0   No current facility-administered medications on file prior to visit.   No Known Allergies Family History  Problem Relation Age of Onset  .  Hyperlipidemia Mother   . Diabetes Mother   . Anxiety disorder Mother   . Heart disease Mother   . Hypertension Father   . Lupus Father   . Heart disease Father   . Stroke Maternal Aunt   . Stroke Paternal Grandfather   . Mental illness Paternal Grandfather    PE: BP 118/68 mmHg  Pulse 76  Temp(Src) 97.9 F (36.6 C) (Oral)  Resp 12  Wt 353 lb 12.8 oz (160.483 kg)  SpO2 95%  LMP 01/25/2008 Wt Readings from Last 3 Encounters:  05/31/15 353 lb 12.8 oz (160.483 kg)  01/24/15 340 lb 6 oz (154.393 kg)  01/09/15 345 lb (156.491 kg)   Constitutional: obese in NAD Eyes: PERRLA, EOMI, no exophthalmos ENT: moist mucous membranes, no thyromegaly, no cervical lymphadenopathy Cardiovascular: RRR, No MRG Respiratory: CTA B Gastrointestinal: abdomen soft, NT, ND, BS+ Musculoskeletal: no deformities, strength intact in all 4 Skin: moist, warm, no rashes Neurological: no tremor with outstretched hands, DTR normal in all 4  ASSESSMENT: 1. DM2, insulin-dependent, uncontrolled, without complications  PLAN:  1. Patient with long-standing, uncontrolled diabetes, on oral antidiabetic regimen + basal insulin, with now far worse sugars over the Holidays, due to dietary indiscretions - We discussed about options for treatment, and we can add Invokana, but she would like to start working on her diet first - discuss about healthier meals - advised to restart wearing her CPAP >> stopped in 03/2015  Patient Instructions  Please continue: - Metformin 1000 mg 2x a day, with meals - Lantus 63 units at bedtime - Amaryl 4 mg 2x a day - Victoza to 1.8 mg daily   Continue to improve your diet.   Please let me  know if the sugars are consistently <80 or >200.  Continue to check sugars 2x a day, rotating check times.  Please return in 3 months with your sugar log.   - check sugars at different times of the day - check 2 times a day, rotating checks - advised for yearly eye exams >> needs one >>  will call and schedule - check HbA1c today >> 10.4% (increased!) - Return to clinic in 3 mo with sugar log

## 2015-05-31 NOTE — Patient Instructions (Signed)
Please continue: - Metformin 1000 mg 2x a day, with meals - Lantus 63 units at bedtime - Amaryl 4 mg 2x a day - Victoza to 1.8 mg daily   Continue to improve your diet.   Please let me know if the sugars are consistently <80 or >200.  Continue to check sugars 2x a day, rotating check times.  Please return in 3 months with your sugar log.

## 2015-06-07 ENCOUNTER — Other Ambulatory Visit: Payer: Self-pay | Admitting: *Deleted

## 2015-06-07 ENCOUNTER — Encounter: Payer: Self-pay | Admitting: Internal Medicine

## 2015-06-07 DIAGNOSIS — F3162 Bipolar disorder, current episode mixed, moderate: Secondary | ICD-10-CM | POA: Diagnosis not present

## 2015-06-07 MED ORDER — INSULIN GLARGINE 100 UNIT/ML SOLOSTAR PEN: 63.0000 [IU] | PEN_INJECTOR | Freq: Every day | SUBCUTANEOUS | Status: DC

## 2015-06-07 MED FILL — LANTUS SOLOSTAR 100 UNITS/M: 100 | 24 days supply | Qty: 15 | Fill #0

## 2015-06-07 NOTE — Telephone Encounter (Signed)
Lantus refill sent to the wrong pharmacy. Resent to [facility]

## 2015-06-08 MED FILL — tiaGABine HCL 4 MG TABS: 4 | 90 days supply | Qty: 360 | Fill #0

## 2015-06-10 ENCOUNTER — Encounter: Payer: Self-pay | Admitting: Pulmonary Disease

## 2015-06-10 DIAGNOSIS — G4733 Obstructive sleep apnea (adult) (pediatric): Secondary | ICD-10-CM

## 2015-06-11 ENCOUNTER — Other Ambulatory Visit: Payer: Self-pay | Admitting: Family Medicine

## 2015-06-11 ENCOUNTER — Other Ambulatory Visit: Payer: Self-pay

## 2015-06-11 ENCOUNTER — Encounter: Payer: Self-pay | Admitting: Internal Medicine

## 2015-06-11 MED ORDER — LIRAGLUTIDE 18 MG/3ML ~~LOC~~ SOPN: 1.8000 mg | PEN_INJECTOR | Freq: Every day | SUBCUTANEOUS | Status: DC

## 2015-06-11 MED FILL — FLOVENT HFA 110 MCG INHALER: 110 | 30 days supply | Qty: 12 | Fill #0

## 2015-06-11 MED FILL — ARIPiprazole 5 MG TABS: 5 | 30 days supply | Qty: 30 | Fill #2

## 2015-06-11 MED FILL — VICTOZA 18 MG/3 ML INJECT P: 18 | 30 days supply | Qty: 9 | Fill #0

## 2015-06-11 MED FILL — QVAR 80 MCG ORAL INHALER: 80 | 30 days supply | Qty: 9 | Fill #6

## 2015-06-11 MED FILL — OMEPRAZOLE DR 40 MG CAPSULE: 40 | 30 days supply | Qty: 60 | Fill #0

## 2015-06-12 MED FILL — lamoTRIgine 200 MG TABS: 200 | 30 days supply | Qty: 60 | Fill #0

## 2015-06-13 ENCOUNTER — Telehealth: Payer: Self-pay | Admitting: Pulmonary Disease

## 2015-06-13 NOTE — Telephone Encounter (Signed)
lmtcb x1 for pt. 

## 2015-06-14 NOTE — Telephone Encounter (Signed)
Spoke with pt.  She took machine to [facility] on 06-11-15 and had them to get a download. She has appt with Vassie Loll on 06-18-15. Download is in RA look at.  PT states she can discuss this with Dr Vassie Loll at her visit.

## 2015-06-14 NOTE — Telephone Encounter (Signed)
Pt is available to 1 she works the [employer] and can't answer   [phone]

## 2015-06-14 NOTE — Telephone Encounter (Signed)
lmtcb x2 for pt. 

## 2015-06-18 ENCOUNTER — Ambulatory Visit (INDEPENDENT_AMBULATORY_CARE_PROVIDER_SITE_OTHER): Payer: 59 | Admitting: Pulmonary Disease

## 2015-06-18 ENCOUNTER — Encounter: Payer: Self-pay | Admitting: Pulmonary Disease

## 2015-06-18 VITALS — BP 132/92 | HR 67 | Ht 68.5 in | Wt 351.2 lb

## 2015-06-18 DIAGNOSIS — G4710 Hypersomnia, unspecified: Secondary | ICD-10-CM | POA: Diagnosis not present

## 2015-06-18 DIAGNOSIS — G4733 Obstructive sleep apnea (adult) (pediatric): Secondary | ICD-10-CM

## 2015-06-18 DIAGNOSIS — G4730 Sleep apnea, unspecified: Secondary | ICD-10-CM | POA: Diagnosis not present

## 2015-06-18 NOTE — Assessment & Plan Note (Signed)
Wt loss encouraged  

## 2015-06-18 NOTE — Patient Instructions (Signed)
You have moderate to severe obstructive sleep apnea  We will get you a new CPAP machine Continue nasal mask We will check report in 30 days

## 2015-06-18 NOTE — Assessment & Plan Note (Signed)
You have moderate to severe obstructive sleep apnea  We will get you a new CPAP machine Continue nasal mask We will check report in 30 days   Weight loss encouraged, compliance with goal of at least 4-6 hrs every night is the expectation. Advised against medications with sedative side effects Cautioned against driving when sleepy - understanding that sleepiness will vary on a day to day basis

## 2015-06-18 NOTE — Progress Notes (Signed)
   Subjective:    Patient ID: [name], female    DOB: 11/01/1970, 45 y.o.   MRN: [id number]  HPI  45 year old obese woman for FU  of OSA. 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  PSG in 2008 showed moderate OSA with AHI of 24 per hour, corrected by C Pap of 11 cm with a small nasal mask. She was initially compliant, but then stopped using it at least for the last 1 year.  She has bipolar disorder on Lamictal and Tegretol. She has been diagnosed with asthma and is maintained on Qvar and albuterol- prior attempt to discontinue Qvar caused increased dyspnea and wheezing.   06/18/2015  Chief Complaint  Patient presents with  . Follow-up    Pt states that with CPAP she sleeps well. Pt wears CPAP about 6 hours nightly. Pt states that she does feel some resistance when breathing and wonders if the pressure could be increased or if she could get a new machine as her current one is more than 44 years old.   She now reports recurrent daytime fatigue and excessive somnolence and non-refreshing sleep. CPAP usage has been sporadic She realises this helps her - feels more rested  Download report reviewed >> last 2 weeks ,4.5h usage, no inf about pr etc Uses nasal mask - has never used nasal pillows Takes lantus, lamictal, gabapentin, coreg at night  No dryness   Review of Systems neg for any significant sore throat, dysphagia, itching, sneezing, nasal congestion or excess/ purulent secretions, fever, chills, sweats, unintended wt loss, pleuritic or exertional cp, hempoptysis, orthopnea pnd or change in chronic leg swelling. Also denies presyncope, palpitations, heartburn, abdominal pain, nausea, vomiting, diarrhea or change in bowel or urinary habits, dysuria,hematuria, rash, arthralgias, visual complaints, headache, numbness weakness or ataxia.     Objective:   Physical Exam  Gen.  Pleasant, obese, in no distress ENT - no lesions, no post nasal drip Neck: No JVD, no thyromegaly, no carotid bruits Lungs: no use of accessory muscles, no dullness to percussion, decreased without rales or rhonchi  Cardiovascular: Rhythm regular, heart sounds  normal, no murmurs or gallops, no peripheral edema Musculoskeletal: No deformities, no cyanosis or clubbing , no tremors       Assessment & Plan:

## 2015-06-19 MED FILL — GABAPENTIN 400 MG CAPSULE: 400 | 30 days supply | Qty: 90 | Fill #0

## 2015-06-20 DIAGNOSIS — G4710 Hypersomnia, unspecified: Secondary | ICD-10-CM | POA: Insufficient documentation

## 2015-06-20 DIAGNOSIS — G4730 Sleep apnea, unspecified: Secondary | ICD-10-CM

## 2015-06-20 NOTE — Assessment & Plan Note (Signed)
Her bipolar medicines will be adjusted. She will try to improve compliance on CPAP If she remains sleepy in spite of good compliance, only then we can consider using a stimulant such as Nuvigil

## 2015-06-26 DIAGNOSIS — G4733 Obstructive sleep apnea (adult) (pediatric): Secondary | ICD-10-CM | POA: Diagnosis not present

## 2015-06-28 DIAGNOSIS — G4733 Obstructive sleep apnea (adult) (pediatric): Secondary | ICD-10-CM | POA: Diagnosis not present

## 2015-06-29 ENCOUNTER — Other Ambulatory Visit: Payer: Self-pay | Admitting: *Deleted

## 2015-06-29 MED ORDER — ESTRADIOL 1 MG PO TABS: ORAL_TABLET | ORAL | Status: DC

## 2015-06-29 MED FILL — GLIMEPIRIDE 4 MG TABLET: 4 | 30 days supply | Qty: 60 | Fill #3

## 2015-06-29 MED FILL — ESTRADIOL 1 MG TABLET: 1 | 90 days supply | Qty: 180 | Fill #0

## 2015-06-29 MED FILL — LANTUS SOLOSTAR 100 UNITS/M: 100 | 24 days supply | Qty: 15 | Fill #1

## 2015-06-29 NOTE — Telephone Encounter (Signed)
Medication refill request: estrace 1 mg Last AEX:  06/23/14 SM Next AEX: 08/30/15 SM Last MMG (if hormonal medication request): 11/21/14 [id number]:neg Refill authorized: 06/23/14 #[id number]/3R. Today please advise.

## 2015-07-02 MED FILL — TEGRETOL XR 200 MG TABLET: 200 | 30 days supply | Qty: 90 | Fill #0

## 2015-07-04 ENCOUNTER — Encounter: Payer: Self-pay | Admitting: Pulmonary Disease

## 2015-07-04 MED FILL — lamoTRIgine 200 MG TABS: 200 | 30 days supply | Qty: 60 | Fill #0

## 2015-07-05 DIAGNOSIS — F319 Bipolar disorder, unspecified: Secondary | ICD-10-CM | POA: Diagnosis not present

## 2015-07-13 MED FILL — OMEPRAZOLE DR 40 MG CAPSULE: 40 | 30 days supply | Qty: 60 | Fill #1

## 2015-07-19 DIAGNOSIS — F3162 Bipolar disorder, current episode mixed, moderate: Secondary | ICD-10-CM | POA: Diagnosis not present

## 2015-07-20 ENCOUNTER — Telehealth: Payer: 59 | Admitting: Family

## 2015-07-20 DIAGNOSIS — B9789 Other viral agents as the cause of diseases classified elsewhere: Secondary | ICD-10-CM

## 2015-07-20 DIAGNOSIS — B349 Viral infection, unspecified: Secondary | ICD-10-CM | POA: Diagnosis not present

## 2015-07-20 DIAGNOSIS — J329 Chronic sinusitis, unspecified: Secondary | ICD-10-CM | POA: Diagnosis not present

## 2015-07-20 NOTE — Progress Notes (Signed)
We are sorry that you are not feeling well.  Here is how we plan to help!  Based on what you have shared with me it looks like you have sinusitis.  Sinusitis is inflammation and infection in the sinus cavities of the head.  Based on your presentation I believe you most likely have Acute Viral Sinusitis.This is an infection most likely caused by a virus. There is not specific treatment for viral sinusitis other than to help you with the symptoms until the infection runs its course.  You may use an oral decongestant such as Mucinex D or if you have glaucoma or high blood pressure use plain Mucinex. Saline nasal spray help and can safely be used as often as needed for congestion. Please continue to use the Flonase and the other supportive care that you are doing now. This appears to be viral.  Some authorities believe that zinc sprays or the use of Echinacea may shorten the course of your symptoms.  Sinus infections are not as easily transmitted as other respiratory infection, however we still recommend that you avoid close contact with loved ones, especially the very young and elderly.  Remember to wash your hands thoroughly throughout the day as this is the number one way to prevent the spread of infection!  Home Care:  Only take medications as instructed by your medical team.  Complete the entire course of an antibiotic.  Do not take these medications with alcohol.  A steam or ultrasonic humidifier can help congestion.  You can place a towel over your head and breathe in the steam from hot water coming from a faucet.  Avoid close contacts especially the very young and the elderly.  Cover your mouth when you cough or sneeze.  Always remember to wash your hands.  Get Help Right Away If:  You develop worsening fever or sinus pain.  You develop a severe head ache or visual changes.  Your symptoms persist after you have completed your treatment plan.  Make sure you  Understand these  instructions.  Will watch your condition.  Will get help right away if you are not doing well or get worse.  Your e-visit answers were reviewed by a board certified advanced clinical practitioner to complete your personal care plan.  Depending on the condition, your plan could have included both over the counter or prescription medications.  If there is a problem please reply  once you have received a response from your provider.  Your safety is important to [place].  If you have drug allergies check your prescription carefully.    You can use MyChart to ask questions about today's visit, request a non-urgent call back, or ask for a work or school excuse for 24 hours related to this e-Visit. If it has been greater than 24 hours you will need to follow up with your provider, or enter a new e-Visit to address those concerns.  You will get an e-mail in the next two days asking about your experience.  I hope that your e-visit has been valuable and will speed your recovery. Thank you for using e-visits.

## 2015-07-22 ENCOUNTER — Telehealth: Payer: 59 | Admitting: Family

## 2015-07-22 DIAGNOSIS — J069 Acute upper respiratory infection, unspecified: Secondary | ICD-10-CM

## 2015-07-22 MED ORDER — BENZONATATE 100 MG PO CAPS: 100.0000 mg | ORAL_CAPSULE | Freq: Two times a day (BID) | ORAL | Status: DC | PRN

## 2015-07-22 NOTE — Progress Notes (Signed)

## 2015-07-23 ENCOUNTER — Encounter: Payer: Self-pay | Admitting: Internal Medicine

## 2015-07-23 ENCOUNTER — Encounter: Payer: Self-pay | Admitting: Family Medicine

## 2015-07-23 DIAGNOSIS — J029 Acute pharyngitis, unspecified: Secondary | ICD-10-CM | POA: Diagnosis not present

## 2015-07-24 ENCOUNTER — Other Ambulatory Visit: Payer: Self-pay | Admitting: *Deleted

## 2015-07-24 DIAGNOSIS — G4733 Obstructive sleep apnea (adult) (pediatric): Secondary | ICD-10-CM | POA: Diagnosis not present

## 2015-07-24 MED ORDER — LISINOPRIL 20 MG PO TABS: 20.0000 mg | ORAL_TABLET | Freq: Every day | ORAL | Status: DC

## 2015-07-24 MED ORDER — METFORMIN HCL 1000 MG PO TABS: 1000.0000 mg | ORAL_TABLET | Freq: Two times a day (BID) | ORAL | Status: DC

## 2015-07-24 MED FILL — GABAPENTIN 400 MG CAPSULE: 400 | 30 days supply | Qty: 90 | Fill #1

## 2015-07-24 MED FILL — GLIMEPIRIDE 4 MG TABLET: 4 | 30 days supply | Qty: 60 | Fill #4

## 2015-07-24 MED FILL — metFORMIN HCL 1000 MG TABS: 1000 | 30 days supply | Qty: 60 | Fill #0

## 2015-07-24 MED FILL — LANTUS SOLOSTAR 100 UNITS/M: 100 | 24 days supply | Qty: 15 | Fill #2

## 2015-07-24 NOTE — Telephone Encounter (Signed)
Pt requested refill via MyChart.  

## 2015-07-24 NOTE — Telephone Encounter (Signed)
Medication filled to pharmacy as requested.   

## 2015-07-26 ENCOUNTER — Telehealth: Payer: Self-pay | Admitting: Behavioral Health

## 2015-07-26 ENCOUNTER — Encounter: Payer: Self-pay | Admitting: Behavioral Health

## 2015-07-26 MED FILL — ARIPiprazole 5 MG TABS: 5 | 30 days supply | Qty: 30 | Fill #0

## 2015-07-26 NOTE — Telephone Encounter (Signed)
Unable to reach patient at time of Pre-Visit Call.  Left message for patient to return call when available.    

## 2015-07-26 NOTE — Addendum Note (Signed)
Addended by: Harold Barban E on: 07/26/2015 11:25 AM   Modules accepted: Orders, Medications

## 2015-07-26 NOTE — Telephone Encounter (Signed)
Pre-Visit Call completed with patient and chart updated.   Pre-Visit Info documented in Specialty Comments under [employer].    

## 2015-07-27 ENCOUNTER — Encounter: Payer: Self-pay | Admitting: Family Medicine

## 2015-07-27 ENCOUNTER — Ambulatory Visit (INDEPENDENT_AMBULATORY_CARE_PROVIDER_SITE_OTHER): Payer: 59 | Admitting: Family Medicine

## 2015-07-27 VITALS — BP 130/84 | HR 82 | Temp 98.2°F | Resp 16 | Ht 69.0 in | Wt 357.1 lb

## 2015-07-27 DIAGNOSIS — Z0000 Encounter for general adult medical examination without abnormal findings: Secondary | ICD-10-CM

## 2015-07-27 DIAGNOSIS — R202 Paresthesia of skin: Secondary | ICD-10-CM

## 2015-07-27 DIAGNOSIS — R12 Heartburn: Secondary | ICD-10-CM

## 2015-07-27 LAB — BASIC METABOLIC PANEL
BUN: 19 mg/dL (ref 6–23)
CALCIUM: 9.4 mg/dL (ref 8.4–10.5)
CO2: 29 mEq/L (ref 19–32)
CREATININE: 0.62 mg/dL (ref 0.40–1.20)
Chloride: 100 mEq/L (ref 96–112)
GFR: 111.3 mL/min (ref >60.00)
Glucose, Bld: 194 mg/dL — ABNORMAL HIGH (ref 70–99)
Potassium: 3.9 mEq/L (ref 3.5–5.1)
SODIUM: 139 meq/L (ref 135–145)

## 2015-07-27 LAB — HEPATIC FUNCTION PANEL
ALK PHOS: 51 U/L (ref 39–117)
ALT: 25 U/L (ref 0–35)
AST: 25 U/L (ref 0–37)
Albumin: 3.9 g/dL (ref 3.5–5.2)
BILIRUBIN DIRECT: 0.1 mg/dL (ref 0.0–0.3)
BILIRUBIN TOTAL: 0.3 mg/dL (ref 0.2–1.2)
TOTAL PROTEIN: 6.9 g/dL (ref 6.0–8.3)

## 2015-07-27 LAB — CBC WITH DIFFERENTIAL/PLATELET
BASOS ABS: 0 10*3/uL (ref 0.0–0.1)
BASOS PCT: 0.6 % (ref 0.0–3.0)
EOS ABS: 0.3 10*3/uL (ref 0.0–0.7)
Eosinophils Relative: 4 % (ref 0.0–5.0)
HEMATOCRIT: 38.9 % (ref 36.0–46.0)
Hemoglobin: 13.1 g/dL (ref 12.0–15.0)
LYMPHS ABS: 2.2 10*3/uL (ref 0.7–4.0)
LYMPHS PCT: 27.9 % (ref 12.0–46.0)
MCHC: 33.6 g/dL (ref 30.0–36.0)
MCV: 84.2 fl (ref 78.0–100.0)
Monocytes Absolute: 0.6 10*3/uL (ref 0.1–1.0)
Monocytes Relative: 7.6 % (ref 3.0–12.0)
NEUTROS ABS: 4.8 10*3/uL (ref 1.4–7.7)
NEUTROS PCT: 59.9 % (ref 43.0–77.0)
PLATELETS: 304 10*3/uL (ref 150.0–400.0)
RBC: 4.62 Mil/uL (ref 3.87–5.11)
RDW: 13.9 % (ref 11.5–15.5)
WBC: 8 10*3/uL (ref 4.0–10.5)

## 2015-07-27 LAB — LIPID PANEL
Cholesterol: 200 mg/dL (ref 0–200)
HDL: 55.7 mg/dL (ref >39.00)
LDL CALC: 110 mg/dL — AB (ref 0–99)
NONHDL: 143.83
Total CHOL/HDL Ratio: 4
Triglycerides: 168 mg/dL — ABNORMAL HIGH (ref 0.0–149.0)
VLDL: 33.6 mg/dL (ref 0.0–40.0)

## 2015-07-27 LAB — TSH: TSH: 1.4 u[IU]/mL (ref 0.35–4.50)

## 2015-07-27 LAB — VITAMIN D 25 HYDROXY (VIT D DEFICIENCY, FRACTURES): VITD: 32.82 ng/mL (ref 30.00–100.00)

## 2015-07-27 MED ORDER — CLOBETASOL PROPIONATE 0.05 % EX OINT: 1.0000 "application " | TOPICAL_OINTMENT | Freq: Two times a day (BID) | CUTANEOUS | Status: DC

## 2015-07-27 MED ORDER — LISINOPRIL 20 MG PO TABS: 20.0000 mg | ORAL_TABLET | Freq: Every day | ORAL | Status: DC

## 2015-07-27 MED ORDER — RANITIDINE HCL 300 MG PO TABS: 300.0000 mg | ORAL_TABLET | Freq: Every day | ORAL | Status: DC

## 2015-07-27 MED FILL — LISINOPRIL 20 MG TABLET: 20 | 90 days supply | Qty: 90 | Fill #0

## 2015-07-27 MED FILL — CLOBETASOL 0.05% OINTMENT: 0.05 | 10 days supply | Qty: 60 | Fill #0

## 2015-07-27 MED FILL — raNITIdine HCL 300 MG TABS: 300 | 90 days supply | Qty: 90 | Fill #0

## 2015-07-27 NOTE — Progress Notes (Signed)
   Subjective:    Patient ID: [name], female    DOB: 02/05/1971, 45 y.o.   MRN: [id number]  HPI CPE- UTD on pap, mammo.  Has appt w/ Endo next month, eye exam April 6th.  Seeing Psych regularly.  Has gained 6 lbs in last month.  UTD on flu, Tdap.   Review of Systems Patient reports no vision/ hearing changes, adenopathy,fever, weight change,  persistant/recurrent hoarseness , swallowing issues, chest pain, palpitations, edema, persistant/recurrent cough, hemoptysis, dyspnea (rest/exertional/paroxysmal nocturnal), gastrointestinal bleeding (melena, rectal bleeding), abdominal pain, bowel changes, GU symptoms (dysuria, hematuria, incontinence), Gyn symptoms (abnormal  bleeding, pain),  syncope, focal weakness, memory loss, skin/hair/nail changes, abnormal bruising or bleeding, anxiety, or depression.   + uncontrolled heart burn.  Now sleeping on wedge and still having break through sxs  + tingling of toes- gabapentin was decreased recently by Dr Nolen MuMcKinney    Objective:   Physical Exam General Appearance:    Alert, cooperative, no distress, appears stated age, obese  Head:    Normocephalic, without obvious abnormality, atraumatic  Eyes:    PERRL, conjunctiva/corneas clear, EOM's intact, fundi    benign, both eyes  Ears:    Normal TM's and external ear canals, both ears  Nose:   Nares normal, septum midline, mucosa normal, no drainage    or sinus tenderness  Throat:   Lips, mucosa, and tongue normal; teeth and gums normal  Neck:   Supple, symmetrical, trachea midline, no adenopathy;    Thyroid: no enlargement/tenderness/nodules  Back:     Symmetric, no curvature, ROM normal, no CVA tenderness  Lungs:     Clear to auscultation bilaterally, respirations unlabored  Chest Wall:    No tenderness or deformity   Heart:    Regular rate and rhythm, S1 and S2 normal, no murmur, rub   or gallop  Breast Exam:    Deferred to GYN  Abdomen:     Soft, non-tender, bowel sounds active all four  quadrants,    no masses, no organomegaly  Genitalia:    Deferred to GYN  Rectal:    Extremities:   Extremities normal, atraumatic, no cyanosis or edema  Pulses:   2+ and symmetric all extremities  Skin:   Skin color, texture, turgor normal, no rashes or lesions  Lymph nodes:   Cervical, supraclavicular, and axillary nodes normal  Neurologic:   CNII-XII intact, normal strength, sensation and reflexes    throughout           Assessment & Plan:

## 2015-07-27 NOTE — Progress Notes (Signed)
Pre visit review using our clinic review tool, if applicable. No additional management support is needed unless otherwise documented below in the visit note. 

## 2015-07-27 NOTE — Assessment & Plan Note (Signed)
Pt's PE WNL w/ exception of obesity.  UTD on GYN.  Has endo appt next month.  Check labs.  Adjust meds prn.  Anticipatory guidance provided.

## 2015-07-27 NOTE — Patient Instructions (Signed)
Follow up in 6 months to recheck BP and cholesterol We'll notify you of your lab results and make any changes if needed Please try and work on healthy diet and regular exercise- you can do it! Call with any questions or concerns If you want to join us at the new [place] office, any scheduled appointments will automatically transfer and we will see you at [address], [place], [place] [id number] [facility](OPENING SPRING) Have a great weekend!

## 2015-07-28 ENCOUNTER — Encounter: Payer: 59 | Attending: Family Medicine | Admitting: Dietician

## 2015-07-28 DIAGNOSIS — Z713 Dietary counseling and surveillance: Secondary | ICD-10-CM | POA: Insufficient documentation

## 2015-07-28 NOTE — Progress Notes (Signed)
Patient was seen on 07/28/15 for the Weight Loss Class at the [facility]. The following learning objectives were met by the patient during this class:   Describe healthy choices in each food group  Describe portion size of foods  Use plate method for meal planning  Demonstrate how to read Nutrition Facts food label  Set realistic goals for weight loss, diet changes, and physical activity.   Goals:  1. Make healthy food choices in each food group.  2. Reduce portion size of foods.  3. Increase fruit and vegetable intake.  4. Use plate method for meal planning.  5. Increase physical activity.    Handouts given:   1. Nutrition Strategies for Weight Loss   2. Meal plan/portion card   3. MyPlate Planner   4. Weight Management Recipe Resources   5. Bake, Broil, Grill   

## 2015-07-30 ENCOUNTER — Ambulatory Visit (INDEPENDENT_AMBULATORY_CARE_PROVIDER_SITE_OTHER): Payer: 59 | Admitting: Adult Health

## 2015-07-30 ENCOUNTER — Encounter: Payer: Self-pay | Admitting: Adult Health

## 2015-07-30 VITALS — BP 116/74 | HR 77 | Temp 98.0°F | Wt 359.0 lb

## 2015-07-30 DIAGNOSIS — G4733 Obstructive sleep apnea (adult) (pediatric): Secondary | ICD-10-CM | POA: Diagnosis not present

## 2015-07-30 NOTE — Addendum Note (Signed)
Addended by: Karalee HeightOX, Clark Clowdus P on: 07/30/2015 02:48 PM   Modules accepted: Orders

## 2015-07-30 NOTE — Patient Instructions (Addendum)
Continue on CPAP At bedtime   Try to get in at least 4 hr each night.  Will change to set pressure at 9cmH20.  Work on weight loss.  Follow up Dr. Vassie LollAlva 6 months and As needed

## 2015-07-30 NOTE — Assessment & Plan Note (Signed)
Wt loss  

## 2015-07-30 NOTE — Progress Notes (Signed)
Subjective:    Patient ID: [name], female    DOB: Dec 19, 1970, 45 y.o.   MRN: [id number]  HPI 45 yo female morbidly obese with  Moderate OSA  S/p gastric bypass dropped from 370 to 200 but regained the weight back.   TEST  PSG in 2008 AHI 24/h    07/30/2015  Follow up : OSA  Pt returns for follow up for sleep apnea.  Uses CPAP on average 5-6 hours nightly, mask fits well.  Recently started on new machine. She does not like this new machine. Feels the pressures are not enough. Feels heat is too much , wants it come down slighlty .  Does like her new mask. Was sick last week and not able to wear.  Down load show okay compliance with avg usage at ~4-hr on auto set 5 to 12cm . AHI 0.3. Min leaks. Avg pressure ~8 cmH20.   Does not feel rested with this machine. Previous on set pressure at 11cm .  Denies chest pain, orthopnea, edema or fever.    Past Medical History  Diagnosis Date  . Asthma     pt stated treated as ashtma but not really asthma  . Chicken pox   . Depression   . Diabetes mellitus   . GERD (gastroesophageal reflux disease)   . Allergy   . Hypertension     readings  . Hyperlipidemia   . Migraine   . Irregular heartbeat   . Sleep apnea   . Bipolar disorder [facility])    Current Outpatient Prescriptions on File Prior to Visit  Medication Sig Dispense Refill  . albuterol (PROVENTIL HFA) 108 (90 BASE) MCG/ACT inhaler INHALE 2 PUFFS INTO THE LUNGS EVERY 4 HOURS AS NEEDED FOR WHEEZING OR SHORTNESS OF BREATH 6.7 g 3  . ARIPiprazole (ABILIFY) 5 MG tablet Take 1 tablet by mouth daily.  2  . aspirin 81 MG tablet Take 160 mg by mouth daily.    [name] atorvastatin (LIPITOR) 40 MG tablet TAKE 1 TABLET BY MOUTH DAILY 90 tablet 1  . Calcium Carbonate-Vitamin D (CALTRATE 600+D PO) Take 1 tablet by mouth 2 (two) times daily.    . carbamazepine (TEGRETOL XR) 200 MG 12 hr tablet Take 200 mg by mouth. 1 tablet in the morning, 2 at night    . carvedilol (COREG) 25 MG tablet TAKE 1 TABLET  BY MOUTH 2 TIMES DAILY. 180 tablet 0  . cholecalciferol (VITAMIN D) 1000 UNITS tablet Take 2,000 Units by mouth daily.    . clobetasol ointment (TEMOVATE) 0.05 % Apply 1 application topically 2 (two) times daily. Do not use for more than 7 days 60 g 1  . cyclobenzaprine (FLEXERIL) 10 MG tablet Take 1 tablet (10 mg total) by mouth 3 (three) times daily as needed for muscle spasms. 30 tablet 0  . estradiol (ESTRACE) 1 MG tablet TAKE 1 TABLET BY MOUTH TWICE A DAY 180 tablet 1  . fenofibrate 160 MG tablet TAKE 1 TABLET BY MOUTH DAILY 30 tablet 5  . fluticasone (FLONASE) 50 MCG/ACT nasal spray Place 2 sprays into both nostrils daily. 16 g 2  . gabapentin (NEURONTIN) 400 MG capsule Take 400 mg by mouth 3 (three) times daily.    [name] glimepiride (AMARYL) 4 MG tablet Take 1 tablet (4 mg total) by mouth 2 (two) times daily. 60 tablet 5  . hydrochlorothiazide (HYDRODIURIL) 25 MG tablet TAKE 1 TABLET (25 MG TOTAL) BY MOUTH DAILY. 90 tablet 1  . Insulin Glargine (LANTUS SOLOSTAR)  100 UNIT/ML Solostar Pen Inject 63 Units into the skin daily at 10 pm. 15 mL 2  . Insulin Pen Needle (B-D UF III MINI PEN NEEDLES) 31G X 5 MM MISC Use to inject insulin 2 times daily 180 each 3  . lamoTRIgine (LAMICTAL) 200 MG tablet Take 200 mg by mouth 2 (two) times daily.    . Lancets 30G MISC Use one lancet each time sugars are checked. Pt tests twice daily. DX E11.9 100 each 2  . Liraglutide (VICTOZA) 18 MG/3ML SOPN Inject 0.3 mLs (1.8 mg total) into the skin daily. 6 mL 3  . lisinopril (PRINIVIL,ZESTRIL) 20 MG tablet Take 1 tablet (20 mg total) by mouth daily. 90 tablet 1  . metFORMIN (GLUCOPHAGE) 1000 MG tablet Take 1 tablet (1,000 mg total) by mouth 2 (two) times daily with a meal. 60 tablet 2  . Multiple Vitamins-Minerals (CENTRUM ULTRA WOMENS PO) Take by mouth.    [name]. omeprazole (PRILOSEC) 40 MG capsule TAKE 1 CAPSULE BY MOUTH TWICE A DAY 60 capsule 5  . ONE TOUCH ULTRA TEST test strip TEST TWICE DAILY 100 each 2  . QVAR 80  MCG/ACT inhaler Inhale 2 puffs into the lungs 2 (two) times daily. 1 Inhaler 6  . ranitidine (ZANTAC) 300 MG tablet Take 1 tablet (300 mg total) by mouth at bedtime. 90 tablet 3  . SUMAtriptan (IMITREX) 100 MG tablet Take 1tab at earliest onset of headache.  May repeat x1 in 2 hours if headache persists or recurs. 9 tablet 0  . tiaGABine (GABITRIL) 4 MG tablet Take 8 mg by mouth 2 (two) times daily.    [name]. VIRTUSSIN A/C 100-10 MG/5ML syrup TK 5 MLS PO HS  0   No current facility-administered medications on file prior to visit.     Review of Systems Constitutional:   No  weight loss, night sweats,  Fevers, chills,  +fatigue, or  lassitude.  HEENT:   No headaches,  Difficulty swallowing,  Tooth/dental problems, or  Sore throat,                No sneezing, itching, ear ache, nasal congestion, post nasal drip,   CV:  No chest pain,  Orthopnea, PND, swelling in lower extremities, anasarca, dizziness, palpitations, syncope.   GI  No heartburn, indigestion, abdominal pain, nausea, vomiting, diarrhea, change in bowel habits, loss of appetite, bloody stools.   Resp:    No chest wall deformity  Skin: no rash or lesions.  GU: no dysuria, change in color of urine, no urgency or frequency.  No flank pain, no hematuria   MS:  No joint pain or swelling.  No decreased range of motion.  No back pain.  Psych:  No change in mood or affect. No depression or anxiety.  No memory loss.         Objective:   Physical Exam  Filed Vitals:   07/30/15 1138  BP: 116/74  Pulse: 77  Temp: 98 F (36.7 C)  TempSrc: Oral  Weight: 359 lb (162.841 kg)  SpO2: 95%   Body mass index is 52.99 kg/(m^2).  GEN: A/Ox3; pleasant , NAD, morbidly obese   HEENT:  /AT,  EACs-clear, TMs-wnl, NOSE-clear, THROAT-clear, no lesions, no postnasal drip or exudate noted.   NECK:  Supple w/ fair ROM; no JVD; normal carotid impulses w/o bruits; no thyromegaly or nodules palpated; no lymphadenopathy.  RESP  Clear  P &  A; w/o, wheezes/ rales/ or rhonchi.no accessory muscle use, no dullness to percussion  CARD:  RRR, no m/r/g  , no peripheral edema, pulses intact, no cyanosis or clubbing.  GI:   Soft & nt; nml bowel sounds; no organomegaly or masses detected.  Musco: Warm bil, no deformities or joint swelling noted.   Neuro: alert, no focal deficits noted.    Skin: Warm, no lesions or rashes  Lorea Parrett NP-C  [facility]  07/30/2015       Assessment & Plan:

## 2015-07-30 NOTE — Assessment & Plan Note (Signed)
Controlled when she wears it.  Will change to set pressure to see if more comfortable.  Change humidity temp as requested encouarged on compliance   Plan  Continue on CPAP At bedtime   Try to get in at least 4 hr each night.  Will change to set pressure at 9cmH20.  Work on weight loss.  follow up Dr. Vassie LollAlva 6 months and As needed

## 2015-07-31 NOTE — Progress Notes (Signed)
Reviewed & agree with plan  

## 2015-08-02 MED FILL — TEGRETOL XR 200 MG TABLET: 200 | 30 days supply | Qty: 90 | Fill #1

## 2015-08-02 MED FILL — HYDROCHLOROTHIAZIDE 25 MG T: 25 | 90 days supply | Qty: 90 | Fill #1

## 2015-08-06 ENCOUNTER — Ambulatory Visit (INDEPENDENT_AMBULATORY_CARE_PROVIDER_SITE_OTHER): Payer: 59 | Admitting: Family Medicine

## 2015-08-06 ENCOUNTER — Encounter: Payer: Self-pay | Admitting: Family Medicine

## 2015-08-06 ENCOUNTER — Ambulatory Visit: Payer: 59 | Admitting: Family

## 2015-08-06 VITALS — HR 76 | Temp 98.9°F

## 2015-08-06 DIAGNOSIS — S8392XA Sprain of unspecified site of left knee, initial encounter: Secondary | ICD-10-CM | POA: Diagnosis not present

## 2015-08-06 DIAGNOSIS — S8002XA Contusion of left knee, initial encounter: Secondary | ICD-10-CM | POA: Diagnosis not present

## 2015-08-06 NOTE — Progress Notes (Signed)
   Subjective:    Patient ID: [name], female    DOB: 12/14/1970, 45 y.o.   MRN: [id number]  HPI Here for injuries that occurred this morning at home. She was carrying groceries up er driveway when her foot slipped over the edge of the driveway, causing her to fall. She landed on her left knee primarily, and also on the right elbow. No head trauma. The elbow is a little sore but that's all. The left knee however is quite painful and swollen. She has pain on bearing weight, and most of this pain is in the anterior knee. No locking or giving way. She iced it for 30 minutes and then came to work. She took 2 Aleve tabs about 8:00 this am.    Review of Systems  Constitutional: Negative.   Respiratory: Negative.   Cardiovascular: Negative.   Musculoskeletal: Positive for joint swelling, arthralgias and gait problem.  Skin: Positive for wound.  Neurological: Negative.        Objective:   Physical Exam  Constitutional:  She is in mild pain, limps when walking  Musculoskeletal:  The left anterior knee is quite swollen and ecchymotic, with swelling extending from the patellar area halfway down the lower leg. She is quite tender in the medial, anterior, and lateral knee. Flexion is limited to 45 degress due to pain, extension is full. McMurrays and anterior drawers are negative. The rigt elbow has full ROM  Skin:  Superficial abrasions over the left anterior knee and the right olecranon.           Assessment & Plan:  Significant injury to the left knee, possibly to both bone and soft tissue. She has a mild abrasion on the right elbow. This requires only applying Neosporin and a bandage daily. For the knee, we will see if her Orthopedic service, [facility], can see her this afternoon.

## 2015-08-06 NOTE — Progress Notes (Signed)
Pre visit review using our clinic review tool, if applicable. No additional management support is needed unless otherwise documented below in the visit note. 

## 2015-08-08 ENCOUNTER — Encounter: Payer: Self-pay | Admitting: Adult Health

## 2015-08-15 DIAGNOSIS — F319 Bipolar disorder, unspecified: Secondary | ICD-10-CM | POA: Diagnosis not present

## 2015-08-16 ENCOUNTER — Encounter (HOSPITAL_BASED_OUTPATIENT_CLINIC_OR_DEPARTMENT_OTHER): Payer: Self-pay | Admitting: *Deleted

## 2015-08-16 ENCOUNTER — Ambulatory Visit (INDEPENDENT_AMBULATORY_CARE_PROVIDER_SITE_OTHER): Payer: 59 | Admitting: Family Medicine

## 2015-08-16 ENCOUNTER — Emergency Department (HOSPITAL_BASED_OUTPATIENT_CLINIC_OR_DEPARTMENT_OTHER): Payer: 59

## 2015-08-16 ENCOUNTER — Emergency Department (HOSPITAL_BASED_OUTPATIENT_CLINIC_OR_DEPARTMENT_OTHER)
Admission: EM | Admit: 2015-08-16 | Discharge: 2015-08-16 | Disposition: A | Discharge status: Home / Self Care | Payer: 59 | Attending: Emergency Medicine | Admitting: Emergency Medicine

## 2015-08-16 VITALS — BP 140/64 | HR 81 | Temp 98.4°F | Ht 69.0 in | Wt 363.6 lb

## 2015-08-16 DIAGNOSIS — Y998 Other external cause status: Secondary | ICD-10-CM | POA: Diagnosis not present

## 2015-08-16 DIAGNOSIS — T148XXA Other injury of unspecified body region, initial encounter: Secondary | ICD-10-CM

## 2015-08-16 DIAGNOSIS — J45909 Unspecified asthma, uncomplicated: Secondary | ICD-10-CM | POA: Insufficient documentation

## 2015-08-16 DIAGNOSIS — M79605 Pain in left leg: Secondary | ICD-10-CM | POA: Diagnosis present

## 2015-08-16 DIAGNOSIS — F319 Bipolar disorder, unspecified: Secondary | ICD-10-CM | POA: Insufficient documentation

## 2015-08-16 DIAGNOSIS — K219 Gastro-esophageal reflux disease without esophagitis: Secondary | ICD-10-CM | POA: Insufficient documentation

## 2015-08-16 DIAGNOSIS — E785 Hyperlipidemia, unspecified: Secondary | ICD-10-CM | POA: Diagnosis not present

## 2015-08-16 DIAGNOSIS — M25462 Effusion, left knee: Secondary | ICD-10-CM

## 2015-08-16 DIAGNOSIS — G43909 Migraine, unspecified, not intractable, without status migrainosus: Secondary | ICD-10-CM | POA: Insufficient documentation

## 2015-08-16 DIAGNOSIS — Z79899 Other long term (current) drug therapy: Secondary | ICD-10-CM | POA: Insufficient documentation

## 2015-08-16 DIAGNOSIS — Y9389 Activity, other specified: Secondary | ICD-10-CM | POA: Diagnosis not present

## 2015-08-16 DIAGNOSIS — Z7951 Long term (current) use of inhaled steroids: Secondary | ICD-10-CM | POA: Insufficient documentation

## 2015-08-16 DIAGNOSIS — L03116 Cellulitis of left lower limb: Secondary | ICD-10-CM | POA: Insufficient documentation

## 2015-08-16 DIAGNOSIS — M7989 Other specified soft tissue disorders: Secondary | ICD-10-CM | POA: Diagnosis not present

## 2015-08-16 DIAGNOSIS — S80212A Abrasion, left knee, initial encounter: Secondary | ICD-10-CM | POA: Diagnosis not present

## 2015-08-16 DIAGNOSIS — E119 Type 2 diabetes mellitus without complications: Secondary | ICD-10-CM | POA: Diagnosis not present

## 2015-08-16 DIAGNOSIS — Z794 Long term (current) use of insulin: Secondary | ICD-10-CM | POA: Insufficient documentation

## 2015-08-16 DIAGNOSIS — R600 Localized edema: Secondary | ICD-10-CM | POA: Diagnosis not present

## 2015-08-16 DIAGNOSIS — S8012XA Contusion of left lower leg, initial encounter: Secondary | ICD-10-CM | POA: Insufficient documentation

## 2015-08-16 DIAGNOSIS — Z7984 Long term (current) use of oral hypoglycemic drugs: Secondary | ICD-10-CM | POA: Diagnosis not present

## 2015-08-16 DIAGNOSIS — Z79818 Long term (current) use of other agents affecting estrogen receptors and estrogen levels: Secondary | ICD-10-CM | POA: Insufficient documentation

## 2015-08-16 DIAGNOSIS — I10 Essential (primary) hypertension: Secondary | ICD-10-CM | POA: Diagnosis not present

## 2015-08-16 DIAGNOSIS — S80212S Abrasion, left knee, sequela: Secondary | ICD-10-CM | POA: Diagnosis not present

## 2015-08-16 DIAGNOSIS — Z7982 Long term (current) use of aspirin: Secondary | ICD-10-CM | POA: Insufficient documentation

## 2015-08-16 DIAGNOSIS — Z8619 Personal history of other infectious and parasitic diseases: Secondary | ICD-10-CM | POA: Diagnosis not present

## 2015-08-16 DIAGNOSIS — Y9289 Other specified places as the place of occurrence of the external cause: Secondary | ICD-10-CM | POA: Insufficient documentation

## 2015-08-16 DIAGNOSIS — R609 Edema, unspecified: Secondary | ICD-10-CM

## 2015-08-16 DIAGNOSIS — W010XXA Fall on same level from slipping, tripping and stumbling without subsequent striking against object, initial encounter: Secondary | ICD-10-CM | POA: Insufficient documentation

## 2015-08-16 LAB — CBC WITH DIFFERENTIAL/PLATELET
BASOS ABS: 0 10*3/uL (ref 0.0–0.1)
BASOS PCT: 0 %
Eosinophils Absolute: 0.5 10*3/uL (ref 0.0–0.7)
Eosinophils Relative: 6 %
HEMATOCRIT: 36.6 % (ref 36.0–46.0)
HEMOGLOBIN: 11.9 g/dL — AB (ref 12.0–15.0)
Lymphocytes Relative: 30 %
Lymphs Abs: 2.4 10*3/uL (ref 0.7–4.0)
MCH: 28.3 pg (ref 26.0–34.0)
MCHC: 32.5 g/dL (ref 30.0–36.0)
MCV: 86.9 fL (ref 78.0–100.0)
MONO ABS: 0.7 10*3/uL (ref 0.1–1.0)
Monocytes Relative: 9 %
NEUTROS ABS: 4.5 10*3/uL (ref 1.7–7.7)
NEUTROS PCT: 55 %
Platelets: 307 10*3/uL (ref 150–400)
RBC: 4.21 MIL/uL (ref 3.87–5.11)
RDW: 13.1 % (ref 11.5–15.5)
WBC: 8.1 10*3/uL (ref 4.0–10.5)

## 2015-08-16 LAB — BASIC METABOLIC PANEL
ANION GAP: 10 (ref 5–15)
BUN: 19 mg/dL (ref 6–20)
CALCIUM: 9 mg/dL (ref 8.9–10.3)
CO2: 26 mmol/L (ref 22–32)
Chloride: 101 mmol/L (ref 101–111)
Creatinine, Ser: 0.62 mg/dL (ref 0.44–1.00)
Glucose, Bld: 267 mg/dL — ABNORMAL HIGH (ref 65–99)
POTASSIUM: 3.6 mmol/L (ref 3.5–5.1)
SODIUM: 137 mmol/L (ref 135–145)

## 2015-08-16 MED ORDER — HYDROCODONE-ACETAMINOPHEN 5-325 MG PO TABS
2.0000 | ORAL_TABLET | Freq: Once | ORAL | Status: AC
  Administered 2015-08-16: 2 via ORAL
  Filled 2015-08-16: qty 2

## 2015-08-16 MED ORDER — CEPHALEXIN 500 MG PO CAPS: 1000.0000 mg | ORAL_CAPSULE | Freq: Two times a day (BID) | ORAL | Status: DC

## 2015-08-16 MED ORDER — CEPHALEXIN 250 MG PO CAPS
1000.0000 mg | ORAL_CAPSULE | Freq: Once | ORAL | Status: AC
  Administered 2015-08-16: 1000 mg via ORAL
  Filled 2015-08-16: qty 4

## 2015-08-16 MED ORDER — IBUPROFEN 400 MG PO TABS
600.0000 mg | ORAL_TABLET | Freq: Once | ORAL | Status: AC
  Administered 2015-08-16: 600 mg via ORAL
  Filled 2015-08-16: qty 1

## 2015-08-16 MED FILL — OMEPRAZOLE DR 40 MG CAPSULE: 40 | 30 days supply | Qty: 60 | Fill #2

## 2015-08-16 MED FILL — lamoTRIgine 200 MG TABS: 200 | 30 days supply | Qty: 60 | Fill #0

## 2015-08-16 MED FILL — VICTOZA 18 MG/3 ML INJECT P: 18 | 30 days supply | Qty: 9 | Fill #1

## 2015-08-16 NOTE — Progress Notes (Signed)
Pre visit review using our clinic review tool, if applicable. No additional management support is needed unless otherwise documented below in the visit note. 

## 2015-08-16 NOTE — ED Notes (Signed)
Fell on left knee 1.5 weeks ago w increased bruising swelling and warmth too left knee

## 2015-08-16 NOTE — Discharge Instructions (Signed)
Cellulitis °Cellulitis is an infection of the skin and the tissue beneath it. The infected area is usually red and tender. Cellulitis occurs most often in the arms and lower legs.  °CAUSES  °Cellulitis is caused by bacteria that enter the skin through cracks or cuts in the skin. The most common types of bacteria that cause cellulitis are staphylococci and streptococci. °SIGNS AND SYMPTOMS  °· Redness and warmth. °· Swelling. °· Tenderness or pain. °· Fever. °DIAGNOSIS  °Your health care provider can usually determine what is wrong based on a physical exam. Blood tests may also be done. °TREATMENT  °Treatment usually involves taking an antibiotic medicine. °HOME CARE INSTRUCTIONS  °· Take your antibiotic medicine as directed by your health care provider. Finish the antibiotic even if you start to feel better. °· Keep the infected arm or leg elevated to reduce swelling. °· Apply a warm cloth to the affected area up to 4 times per day to relieve pain. °· Take medicines only as directed by your health care provider. °· Keep all follow-up visits as directed by your health care provider. °SEEK MEDICAL CARE IF:  °· You notice red streaks coming from the infected area. °· Your red area gets larger or turns dark in color. °· Your bone or joint underneath the infected area becomes painful after the skin has healed. °· Your infection returns in the same area or another area. °· You notice a swollen bump in the infected area. °· You develop new symptoms. °· You have a fever. °SEEK IMMEDIATE MEDICAL CARE IF:  °· You feel very sleepy. °· You develop vomiting or diarrhea. °· You have a general ill feeling (malaise) with muscle aches and pains. °  °This information is not intended to replace advice given to you by your health care provider. Make sure you discuss any questions you have with your health care provider. °  °Document Released: 02/19/2005 Document Revised: 01/31/2015 Document Reviewed: 07/28/2011 °Elsevier Interactive  Patient Education ©2016 [employer] ° °Contusion °A contusion is a deep bruise. Contusions are the result of a blunt injury to tissues and muscle fibers under the skin. The injury causes bleeding under the skin. The skin overlying the contusion may turn blue, purple, or yellow. Minor injuries will give you a painless contusion, but more severe contusions may stay painful and swollen for a few weeks.  °CAUSES  °This condition is usually caused by a blow, trauma, or direct force to an area of the body. °SYMPTOMS  °Symptoms of this condition include: °· Swelling of the injured area. °· Pain and tenderness in the injured area. °· Discoloration. The area may have redness and then turn blue, purple, or yellow. °DIAGNOSIS  °This condition is diagnosed based on a physical exam and medical history. An X-ray, CT scan, or MRI may be needed to determine if there are any associated injuries, such as broken bones (fractures). °TREATMENT  °Specific treatment for this condition depends on what area of the body was injured. In general, the best treatment for a contusion is resting, icing, applying pressure to (compression), and elevating the injured area. This is often called the RICE strategy. Over-the-counter anti-inflammatory medicines may also be recommended for pain control.  °HOME CARE INSTRUCTIONS  °· Rest the injured area. °· If directed, apply ice to the injured area: °¨ Put ice in a plastic bag. °¨ Place a towel between your skin and the bag. °¨ Leave the ice on for 20 minutes, 2-3 times per day. °· If   directed, apply light compression to the injured area using an elastic bandage. Make sure the bandage is not wrapped too tightly. Remove and reapply the bandage as directed by your health care provider. °· If possible, raise (elevate) the injured area above the level of your heart while you are sitting or lying down. °· Take over-the-counter and prescription medicines only as told by your health care provider. °SEEK  MEDICAL CARE IF: °· Your symptoms do not improve after several days of treatment. °· Your symptoms get worse. °· You have difficulty moving the injured area. °SEEK IMMEDIATE MEDICAL CARE IF:  °· You have severe pain. °· You have numbness in a hand or foot. °· Your hand or foot turns pale or cold. °  °This information is not intended to replace advice given to you by your health care provider. Make sure you discuss any questions you have with your health care provider. °  °Document Released: 02/19/2005 Document Revised: 01/31/2015 Document Reviewed: 09/27/2014 °Elsevier Interactive Patient Education ©2016 Elsevier Inc. ° °

## 2015-08-16 NOTE — ED Provider Notes (Signed)
CSN: [id number]     Arrival date & time 08/16/15  1834 History  By signing my name below, I, Bethel BornBritney McCollum, attest that this documentation has been prepared under the direction and in the presence of Arby BarretteMarcy Raylon Lamson, MD. Electronically Signed: Bethel BornBritney McCollum, ED Scribe. 08/16/2015. 8:19 PM     Chief Complaint  Patient presents with  . Leg Pain   The history is provided by the patient. No language interpreter was used.   [name] is a 45 y.o. female who presents to the Emergency Department complaining of constant, 7/10 in severity, left knee and left lower leg pain with onset 1.5 weeks ago after a fall. Her foot was caught in a sidewalk inconsistency and she tripped falling to the ground on her left knee. Motrin (last dose 12 hours ago) and hydrocodone (last dose 7 hours ago) have provided insufficient relief at home. Associated symptoms include increased warmth, a worsening bruise, and constant swelling at the leg. She has been evaluated by an orthopedist where she had an XR negative for fracture. Today she saw her PCP and was advised to come to the ED.   Past Medical History  Diagnosis Date  . Asthma     pt stated treated as ashtma but not really asthma  . Chicken pox   . Depression   . Diabetes mellitus   . GERD (gastroesophageal reflux disease)   . Allergy   . Hypertension     readings  . Hyperlipidemia   . Migraine   . Irregular heartbeat   . Sleep apnea   . Bipolar disorder [facility](HCC)    Past Surgical History  Procedure Laterality Date  . Cesarean section      1995  . Thyroidectomy, partial  2001    removed left  . Laparoscopic assisted vaginal hysterectomy  2009      BSO fibroids, DUB, pelvic pain  . Back surgery  1999  . Ankle surgery  1989    left  . Umbilical hernia repair  2003  . Oophorectomy Bilateral 2009    cyst   Family History  Problem Relation Age of Onset  . Hyperlipidemia Mother   . Diabetes Mother   . Anxiety disorder Mother   . Heart disease  Mother   . Hypertension Father   . Lupus Father   . Heart disease Father   . Stroke Maternal Aunt   . Stroke Paternal Grandfather   . Mental illness Paternal Grandfather    Social History  Substance Use Topics  . Smoking status: Never Smoker   . Smokeless tobacco: Never Used  . Alcohol Use: No   OB History    Gravida Para Term Preterm AB TAB SAB Ectopic Multiple Living   1 1 1       1      Review of Systems  Musculoskeletal:       Left leg and foot pain, swelling, and bruising  Skin: Positive for color change. Negative for wound.  All other systems reviewed and are negative.  Allergies  Review of patient's allergies indicates no known allergies.  Home Medications   Prior to Admission medications   Medication Sig Start Date End Date Taking? Authorizing Provider  albuterol (PROVENTIL HFA) 108 (90 BASE) MCG/ACT inhaler INHALE 2 PUFFS INTO THE LUNGS EVERY 4 HOURS AS NEEDED FOR WHEEZING OR SHORTNESS OF BREATH 08/30/14   Sheliah HatchKatherine E Tabori, MD  ARIPiprazole (ABILIFY) 5 MG tablet Take 1 tablet by mouth daily. 05/10/15   Historical Provider, MD  aspirin 81 MG tablet Take 160 mg by mouth daily.    Historical Provider, MD  atorvastatin (LIPITOR) 40 MG tablet TAKE 1 TABLET BY MOUTH DAILY 01/15/15   Sheliah Hatch, MD  Calcium Carbonate-Vitamin D (CALTRATE 600+D PO) Take 1 tablet by mouth 2 (two) times daily.    Historical Provider, MD  carbamazepine (TEGRETOL XR) 200 MG 12 hr tablet Take 200 mg by mouth. 1 tablet in the morning, 2 at night    Historical Provider, MD  carvedilol (COREG) 25 MG tablet TAKE 1 TABLET BY MOUTH 2 TIMES DAILY. 05/10/15   Sheliah Hatch, MD  cephALEXin (KEFLEX) 500 MG capsule Take 2 capsules (1,000 mg total) by mouth 2 (two) times daily. 08/16/15   Arby Barrette, MD  cholecalciferol (VITAMIN D) 1000 UNITS tablet Take 2,000 Units by mouth daily.    Historical Provider, MD  clobetasol ointment (TEMOVATE) 0.05 % Apply 1 application topically 2 (two) times  daily. Do not use for more than 7 days 07/27/15   Sheliah Hatch, MD  cyclobenzaprine (FLEXERIL) 10 MG tablet Take 1 tablet (10 mg total) by mouth 3 (three) times daily as needed for muscle spasms. 11/21/14   Mary-Margaret Daphine Deutscher, FNP  estradiol (ESTRACE) 1 MG tablet TAKE 1 TABLET BY MOUTH TWICE A DAY 06/29/15   Jerene Bears, MD  fenofibrate 160 MG tablet TAKE 1 TABLET BY MOUTH DAILY 01/15/15   Sheliah Hatch, MD  fluticasone [facility]) 50 MCG/ACT nasal spray Place 2 sprays into both nostrils daily. 01/24/15   Sheliah Hatch, MD  gabapentin (NEURONTIN) 400 MG capsule Take 400 mg by mouth 3 (three) times daily.    Historical Provider, MD  glimepiride (AMARYL) 4 MG tablet Take 1 tablet (4 mg total) by mouth 2 (two) times daily. 03/20/15   Carlus Pavlov, MD  hydrochlorothiazide (HYDRODIURIL) 25 MG tablet TAKE 1 TABLET (25 MG TOTAL) BY MOUTH DAILY. 04/02/15   Sheliah Hatch, MD  HYDROcodone-acetaminophen (NORCO) 10-325 MG tablet Take 1 tablet by mouth 2 (two) times daily.    Historical Provider, MD  Insulin Glargine (LANTUS SOLOSTAR) 100 UNIT/ML Solostar Pen Inject 63 Units into the skin daily at 10 pm. 06/07/15   Carlus Pavlov, MD  Insulin Pen Needle (B-D UF III MINI PEN NEEDLES) 31G X 5 MM MISC Use to inject insulin 2 times daily 03/20/15   Carlus Pavlov, MD  lamoTRIgine (LAMICTAL) 200 MG tablet Take 200 mg by mouth 2 (two) times daily.    Historical Provider, MD  Lancets 30G MISC Use one lancet each time sugars are checked. Pt tests twice daily. DX E11.9 05/24/14   Sheliah Hatch, MD  Liraglutide (VICTOZA) 18 MG/3ML SOPN Inject 0.3 mLs (1.8 mg total) into the skin daily. 06/11/15   Carlus Pavlov, MD  lisinopril (PRINIVIL,ZESTRIL) 20 MG tablet Take 1 tablet (20 mg total) by mouth daily. 07/27/15   Sheliah Hatch, MD  metFORMIN (GLUCOPHAGE) 1000 MG tablet Take 1 tablet (1,000 mg total) by mouth 2 (two) times daily with a meal. 07/24/15   Carlus Pavlov, MD  Multiple  Vitamins-Minerals (CENTRUM ULTRA WOMENS PO) Take by mouth.    Historical Provider, MD  omeprazole (PRILOSEC) 40 MG capsule TAKE 1 CAPSULE BY MOUTH TWICE A DAY 06/11/15   Sheliah Hatch, MD  ONE [facility] ULTRA TEST test strip TEST TWICE DAILY 05/23/14   Sheliah Hatch, MD  QVAR 80 MCG/ACT inhaler Inhale 2 puffs into the lungs 2 (two) times daily. 10/31/14   Ramon Dredge  Saguier, PA-C  ranitidine (ZANTAC) 300 MG tablet Take 1 tablet (300 mg total) by mouth at bedtime. 07/27/15   Sheliah Hatch, MD  SUMAtriptan (IMITREX) 100 MG tablet Take 1tab at earliest onset of headache.  May repeat x1 in 2 hours if headache persists or recurs. 02/10/14   Drema Dallas, DO  tiaGABine (GABITRIL) 4 MG tablet Take 8 mg by mouth 2 (two) times daily.    Historical Provider, MD   BP 172/85 mmHg  Pulse 84  Temp(Src) 98.2 F (36.8 C) (Oral)  Resp 20  Ht  (1.753 m)  Wt 363 lb (164.656 kg)  BMI 53.58 kg/m2  SpO2 97%  LMP 01/25/2008 Physical Exam  Constitutional: She is oriented to person, place, and time. She appears well-developed and well-nourished. No distress.  HENT:  Head: Normocephalic and atraumatic.  Eyes: EOM are normal.  Neck: Normal range of motion.  Cardiovascular: Normal rate, regular rhythm and normal heart sounds.   Pulmonary/Chest: Effort normal and breath sounds normal.  Abdominal: Soft. She exhibits no distension. There is no tenderness.  Musculoskeletal: Normal range of motion.  Neurological: She is alert and oriented to person, place, and time.  Skin: Skin is warm and dry.  Psychiatric: She has a normal mood and affect. Judgment normal.  Nursing note and vitals reviewed.         ED Course  Procedures (including critical care time) DIAGNOSTIC STUDIES: Oxygen Saturation is 97% on RA,  normal by my interpretation.    COORDINATION OF CARE: 8:14 PM Discussed treatment plan which includes lab work, venous US, and pain management with pt at bedside and pt agreed to plan.  Labs  Review Labs Reviewed  BASIC METABOLIC PANEL - Abnormal; Notable for the following:    Glucose, Bld 267 (*)    All other components within normal limits  CBC WITH DIFFERENTIAL/PLATELET - Abnormal; Notable for the following:    Hemoglobin 11.9 (*)    All other components within normal limits    Imaging Review No results found. I have personally reviewed and evaluated these images and lab results as part of my medical decision-making.   EKG Interpretation None      MDM   Final diagnoses:  Knee abrasion, left, sequela  Cellulitis of left lower extremity  Peripheral edema  Contusion   Ultrasound shows no DVT. Patient had traumatic injury approximately one half weeks ago with significant bruising and swelling to her lower leg. She does not have any signs of compartment syndrome. pedal pulse is easily palpable in the foot is warm and dry. She has some diffuse warm erythema of the lower leg. It appears suspicious for cellulitis. It is possible however that this may still be inflammatory response with extensive bruising as illustrated in the photo documented. However at 1-1/2 weeks post injury and warm pink and blanching of the lower leg, the patient will be started on Keflex. He is advised to keep the leg elevated and return with signs and symptoms of fever or chills or constitutional illness. She is to follow-up with her family provider.   Arby Barrette, MD 08/20/15 [phone]

## 2015-08-16 NOTE — Patient Instructions (Signed)
Please go downstairs to the ER for further evaluation. 

## 2015-08-16 NOTE — ED Notes (Signed)
She fell 2 weeks ago. Injury to her left knee and lower leg at the time of the fall. Pain has not gotten better. She saw her MD today and was advised to come her to r/o cellulitis vs clot

## 2015-08-16 NOTE — Progress Notes (Signed)
[facility] [address], [address] [place], [place] [id number] [phone] [phone]  Date:  08/16/2015   Name:  [name]   DOB:  [birth year]   MRN:  [id number]  PCP:  Neena RhymesKatherine Tabori, MD    Chief Complaint: Fall   History of Present Illness:  [name] is a 45 y.o. very pleasant female patient who presents with the following:  History of morbid obesity, DM, hyperlipidemia, bipolar disorder.  BMI is 53 She hurt her left knee when she fell on her driveway 2.5 weeks ago.  She scraped her knee and it was sore. [facility] and [facility] is no fracture- planned follow-up there next week.  However over the last several days she has noted more pain, warmth and swelling. The leg hurts when she walks. No fevers.   Dm is poorly controlled  Lab Results  Component Value Date   HGBA1C 10.4 05/31/2015     Patient Active Problem List   Diagnosis Date Noted  . Hypersomnia with sleep apnea 06/20/2015  . Type 2 diabetes mellitus with hyperglycemia (HCC) 01/09/2015  . Foul smelling urine 03/26/2014  . Asthmatic bronchitis with acute exacerbation 03/24/2014  . Right lumbar radiculopathy 03/13/2014  . Occipital headache 01/03/2014  . 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 Chest pain 04/12/2013  . Sinusitis, acute maxillary 02/03/2013  . Orthostasis 02/03/2013  . Benign paroxysmal positional vertigo 02/03/2013  . Ear pain 03/12/2012  . Cerumen impaction 11/12/2011  . General medical examination 08/06/2011  . SINUS TACHYCARDIA 08/22/2008  . GERD 08/22/2008  . Diabetes mellitus type II, uncontrolled (HCC) 05/25/2007  . HYPERLIPIDEMIA, MIXED 05/25/2007  . OBESITY, MORBID 05/25/2007  . Bipolar disorder (HCC) 05/25/2007  . MIGRAINE, CHRONIC 05/25/2007  . Essential hypertension 05/25/2007  . Allergic rhinitis 05/25/2007  . Obstructive sleep apnea 05/17/2007    Past Medical History  Diagnosis Date  . Asthma     pt stated  treated as ashtma but not really asthma  . Chicken pox   . Depression   . Diabetes mellitus   . GERD (gastroesophageal reflux disease)   . Allergy   . Hypertension     readings  . Hyperlipidemia   . Migraine   . Irregular heartbeat   . Sleep apnea   . Bipolar disorder [facility](HCC)     Past Surgical History  Procedure Laterality Date  . Cesarean section      1995  . Thyroidectomy, partial  2001    removed left  . Laparoscopic assisted vaginal hysterectomy  2009      BSO fibroids, DUB, pelvic pain  . Back surgery  1999  . Ankle surgery  1989    left  . Umbilical hernia repair  2003  . Oophorectomy Bilateral 2009    cyst    Social History  Substance Use Topics  . Smoking status: Never Smoker   . Smokeless tobacco: Never Used  . Alcohol Use: No    Family History  Problem Relation Age of Onset  . Hyperlipidemia Mother   . Diabetes Mother   . Anxiety disorder Mother   . Heart disease Mother   . Hypertension Father   . Lupus Father   . Heart disease Father   . Stroke Maternal Aunt   . Stroke Paternal Grandfather   . Mental illness Paternal Grandfather     No Known Allergies  Medication list has been reviewed and updated.  Current Outpatient Prescriptions  on File Prior to Visit  Medication Sig Dispense Refill  . albuterol (PROVENTIL HFA) 108 (90 BASE) MCG/ACT inhaler INHALE 2 PUFFS INTO THE LUNGS EVERY 4 HOURS AS NEEDED FOR WHEEZING OR SHORTNESS OF BREATH 6.7 g 3  . ARIPiprazole (ABILIFY) 5 MG tablet Take 1 tablet by mouth daily.  2  . aspirin 81 MG tablet Take 160 mg by mouth daily.    [name] atorvastatin (LIPITOR) 40 MG tablet TAKE 1 TABLET BY MOUTH DAILY 90 tablet 1  . Calcium Carbonate-Vitamin D (CALTRATE 600+D PO) Take 1 tablet by mouth 2 (two) times daily.    . carbamazepine (TEGRETOL XR) 200 MG 12 hr tablet Take 200 mg by mouth. 1 tablet in the morning, 2 at night    . carvedilol (COREG) 25 MG tablet TAKE 1 TABLET BY MOUTH 2 TIMES DAILY. 180 tablet 0  .  cholecalciferol (VITAMIN D) 1000 UNITS tablet Take 2,000 Units by mouth daily.    . clobetasol ointment (TEMOVATE) 0.05 % Apply 1 application topically 2 (two) times daily. Do not use for more than 7 days 60 g 1  . cyclobenzaprine (FLEXERIL) 10 MG tablet Take 1 tablet (10 mg total) by mouth 3 (three) times daily as needed for muscle spasms. 30 tablet 0  . estradiol (ESTRACE) 1 MG tablet TAKE 1 TABLET BY MOUTH TWICE A DAY 180 tablet 1  . fenofibrate 160 MG tablet TAKE 1 TABLET BY MOUTH DAILY 30 tablet 5  . fluticasone (FLONASE) 50 MCG/ACT nasal spray Place 2 sprays into both nostrils daily. 16 g 2  . gabapentin (NEURONTIN) 400 MG capsule Take 400 mg by mouth 3 (three) times daily.    [name] glimepiride (AMARYL) 4 MG tablet Take 1 tablet (4 mg total) by mouth 2 (two) times daily. 60 tablet 5  . hydrochlorothiazide (HYDRODIURIL) 25 MG tablet TAKE 1 TABLET (25 MG TOTAL) BY MOUTH DAILY. 90 tablet 1  . Insulin Glargine (LANTUS SOLOSTAR) 100 UNIT/ML Solostar Pen Inject 63 Units into the skin daily at 10 pm. 15 mL 2  . Insulin Pen Needle (B-D UF III MINI PEN NEEDLES) 31G X 5 MM MISC Use to inject insulin 2 times daily 180 each 3  . lamoTRIgine (LAMICTAL) 200 MG tablet Take 200 mg by mouth 2 (two) times daily.    . Lancets 30G MISC Use one lancet each time sugars are checked. Pt tests twice daily. DX E11.9 100 each 2  . Liraglutide (VICTOZA) 18 MG/3ML SOPN Inject 0.3 mLs (1.8 mg total) into the skin daily. 6 mL 3  . lisinopril (PRINIVIL,ZESTRIL) 20 MG tablet Take 1 tablet (20 mg total) by mouth daily. 90 tablet 1  . metFORMIN (GLUCOPHAGE) 1000 MG tablet Take 1 tablet (1,000 mg total) by mouth 2 (two) times daily with a meal. 60 tablet 2  . Multiple Vitamins-Minerals (CENTRUM ULTRA WOMENS PO) Take by mouth.    [name] omeprazole (PRILOSEC) 40 MG capsule TAKE 1 CAPSULE BY MOUTH TWICE A DAY 60 capsule 5  . ONE TOUCH ULTRA TEST test strip TEST TWICE DAILY 100 each 2  . QVAR 80 MCG/ACT inhaler Inhale 2 puffs into the  lungs 2 (two) times daily. 1 Inhaler 6  . ranitidine (ZANTAC) 300 MG tablet Take 1 tablet (300 mg total) by mouth at bedtime. 90 tablet 3  . SUMAtriptan (IMITREX) 100 MG tablet Take 1tab at earliest onset of headache.  May repeat x1 in 2 hours if headache persists or recurs. 9 tablet 0  . tiaGABine (GABITRIL) 4 MG  tablet Take 8 mg by mouth 2 (two) times daily.     No current facility-administered medications on file prior to visit.    Review of Systems:  As per HPI- otherwise negative.   Physical Examination: Filed Vitals:   08/16/15 1805  BP: 140/64  Pulse: 81  Temp: 98.4 F (36.9 C)   Filed Vitals:   08/16/15 1805  Height:  (1.753 m)  Weight: 363 lb 9.6 oz (164.928 kg)   Body mass index is 53.67 kg/(m^2). Ideal Body Weight: Weight in (lb) to have BMI = 25: 168.9  GEN: WDWN, NAD, Non-toxic, A & O x 3, quite obese HEENT: Atraumatic, Normocephalic. Neck supple. No masses, No LAD. Ears and Nose: No external deformity. CV: RRR, No M/G/R. No JVD. No thrill. No extra heart sounds. PULM: CTA B, no wheezes, crackles, rhonchi. No retractions. No resp. distress. No accessory muscle use. EXTR: No c/c/e NEURO Normal gait.  PSYCH: Normally interactive. Conversant. Not depressed or anxious appearing.  Calm demeanor. The left calf is significantly swollen, tender.  She has a slightly erythematous area approx 3x5in in size at the medial knee.  Abrasion at the lateral knee that does not appear infected. Pain with palpation of the knee, less so with movement of the joint   Assessment and Plan: Knee swelling, left  I am concerned that she may have a more significant finding such as a DVT or infection near her knee joint. Sent downstairs to the ER for further evaluation   Signed Abbe Amsterdam, MD

## 2015-08-16 NOTE — ED Notes (Signed)
Patient transported to Ultrasound 

## 2015-08-21 ENCOUNTER — Other Ambulatory Visit: Payer: Self-pay | Admitting: Orthopedic Surgery

## 2015-08-21 DIAGNOSIS — M25511 Pain in right shoulder: Secondary | ICD-10-CM | POA: Diagnosis not present

## 2015-08-21 DIAGNOSIS — S8002XD Contusion of left knee, subsequent encounter: Secondary | ICD-10-CM | POA: Diagnosis not present

## 2015-08-21 DIAGNOSIS — M25562 Pain in left knee: Secondary | ICD-10-CM

## 2015-08-24 DIAGNOSIS — G4733 Obstructive sleep apnea (adult) (pediatric): Secondary | ICD-10-CM | POA: Diagnosis not present

## 2015-08-27 ENCOUNTER — Other Ambulatory Visit: Payer: Self-pay | Admitting: Family Medicine

## 2015-08-27 ENCOUNTER — Other Ambulatory Visit: Payer: Self-pay | Admitting: Internal Medicine

## 2015-08-27 MED FILL — ARIPiprazole 5 MG TABS: 5 | 30 days supply | Qty: 30 | Fill #1

## 2015-08-27 MED FILL — FENOFIBRATE 160 MG TABLET: 160 | 90 days supply | Qty: 90 | Fill #0

## 2015-08-27 MED FILL — CARVEDILOL 25 MG TABLET: 25 | 90 days supply | Qty: 180 | Fill #0

## 2015-08-27 MED FILL — GLIMEPIRIDE 4 MG TABLET: 4 | 30 days supply | Qty: 60 | Fill #5

## 2015-08-27 MED FILL — metFORMIN HCL 1000 MG TABS: 1000 | 30 days supply | Qty: 60 | Fill #1

## 2015-08-27 MED FILL — GABAPENTIN 400 MG CAPSULE: 400 | 30 days supply | Qty: 90 | Fill #0

## 2015-08-27 MED FILL — ATORVASTATIN 40 MG TABLET: 40 | 90 days supply | Qty: 90 | Fill #0

## 2015-08-27 MED FILL — LANTUS SOLOSTAR 100 UNITS/M: 100 | 24 days supply | Qty: 15 | Fill #0

## 2015-08-27 NOTE — Telephone Encounter (Signed)
Medication filled to pharmacy as requested.   

## 2015-08-29 ENCOUNTER — Ambulatory Visit
Admission: RE | Admit: 2015-08-29 | Discharge: 2015-08-29 | Disposition: A | Discharge status: Home / Self Care | Payer: 59 | Source: Ambulatory Visit | Attending: Orthopedic Surgery | Admitting: Orthopedic Surgery

## 2015-08-29 DIAGNOSIS — M25562 Pain in left knee: Secondary | ICD-10-CM

## 2015-08-29 DIAGNOSIS — M7989 Other specified soft tissue disorders: Secondary | ICD-10-CM | POA: Diagnosis not present

## 2015-08-30 ENCOUNTER — Ambulatory Visit (INDEPENDENT_AMBULATORY_CARE_PROVIDER_SITE_OTHER): Payer: 59 | Admitting: Obstetrics & Gynecology

## 2015-08-30 ENCOUNTER — Ambulatory Visit: Payer: 59 | Admitting: Internal Medicine

## 2015-08-30 ENCOUNTER — Encounter: Payer: Self-pay | Admitting: Obstetrics & Gynecology

## 2015-08-30 VITALS — BP 130/70 | HR 80 | Resp 18 | Ht 68.0 in | Wt 352.0 lb

## 2015-08-30 DIAGNOSIS — Z01419 Encounter for gynecological examination (general) (routine) without abnormal findings: Secondary | ICD-10-CM | POA: Diagnosis not present

## 2015-08-30 DIAGNOSIS — G4733 Obstructive sleep apnea (adult) (pediatric): Secondary | ICD-10-CM

## 2015-08-30 DIAGNOSIS — IMO0001 Reserved for inherently not codable concepts without codable children: Secondary | ICD-10-CM

## 2015-08-30 DIAGNOSIS — E1165 Type 2 diabetes mellitus with hyperglycemia: Secondary | ICD-10-CM

## 2015-08-30 DIAGNOSIS — H25043 Posterior subcapsular polar age-related cataract, bilateral: Secondary | ICD-10-CM | POA: Diagnosis not present

## 2015-08-30 DIAGNOSIS — E119 Type 2 diabetes mellitus without complications: Secondary | ICD-10-CM | POA: Diagnosis not present

## 2015-08-30 LAB — HM DIABETES EYE EXAM

## 2015-08-30 NOTE — Progress Notes (Signed)
45 y.o. G1P1001 MarriedCaucasianF here for annual exam.  Pt reports she is doing well now.  However, she a lot of URIs this past year.  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  However, sitting more than she used to in the past.    Denies vaginal bleeding.  H/O TLH/BSO 9/09.  Seeing Dr. Elvera LennoxGherghe, endocrinology, for diabetes.    Pt still have chronic vulvar itching.  Biopsy done 08/17/13.  Results:   Vulva, biopsy, left superior labia majora - SLIGHT SQUAMOUS HYPERPLASIA AND MILD HYPERKERATOSIS. - NO DYSPLASIA OR EVIDENCE OF MALIGNANCY. 2. Vulva, biopsy, right inferior labia majora - SEBORRHEIC KERATOSIS. - NO DYSPLASIA OR EVIDENCE OF MALIGNANCY.  Patient's last menstrual period was 01/25/2008.          Sexually active: Yes.    The current method of family planning is status post hysterectomy.    Exercising: No.  The patient does not participate in regular exercise at present. Smoker:  no  Health Maintenance: Pap:  2009 Normal  History of abnormal Pap:  no MMG:  11/21/14 BIRADS1:neg Colonoscopy:  Never BMD:   Never TDaP:  10/29/2007  Screening Labs: PCP, Hb today: PCP, Urine today: PCP   reports that she has never smoked. She has never used smokeless tobacco. She reports that she does not drink alcohol or use illicit drugs.  Past Medical History  Diagnosis Date  . Asthma     pt stated treated as ashtma but not really asthma  . Chicken pox   . Depression   . Diabetes mellitus   . GERD (gastroesophageal reflux disease)   . Allergy   . Hypertension     readings  . Hyperlipidemia   . Migraine   . Irregular heartbeat   . Sleep apnea   . Bipolar disorder [facility](HCC)     Past Surgical History  Procedure Laterality Date  . Cesarean section      1995  . Thyroidectomy, partial  2001    removed left  . Laparoscopic assisted vaginal hysterectomy  2009      BSO fibroids, DUB, pelvic pain  . Back surgery  1999  . Ankle surgery  1989    left  . Umbilical hernia  repair  2003  . Oophorectomy Bilateral 2009    cyst    Current Outpatient Prescriptions  Medication Sig Dispense Refill  . albuterol (PROVENTIL HFA) 108 (90 BASE) MCG/ACT inhaler INHALE 2 PUFFS INTO THE LUNGS EVERY 4 HOURS AS NEEDED FOR WHEEZING OR SHORTNESS OF BREATH 6.7 g 3  . ARIPiprazole (ABILIFY) 5 MG tablet Take 1 tablet by mouth daily.  2  . aspirin 81 MG tablet Take 160 mg by mouth daily.    [name]. atorvastatin (LIPITOR) 40 MG tablet TAKE 1 TABLET BY MOUTH DAILY 90 tablet 1  . Calcium Carbonate-Vitamin D (CALTRATE 600+D PO) Take 1 tablet by mouth 2 (two) times daily.    . carbamazepine (TEGRETOL XR) 200 MG 12 hr tablet Take 200 mg by mouth. 1 tablet in the morning, 2 at night    . carvedilol (COREG) 25 MG tablet TAKE 1 TABLET BY MOUTH 2 TIMES DAILY. 180 tablet 1  . cholecalciferol (VITAMIN D) 1000 UNITS tablet Take 2,000 Units by mouth daily.    . clobetasol ointment (TEMOVATE) 0.05 % Apply 1 application topically 2 (two) times daily. Do not use for more than 7 days 60 g 1  . cyclobenzaprine (FLEXERIL) 10 MG tablet Take 1 tablet (10 mg total)  by mouth 3 (three) times daily as needed for muscle spasms. 30 tablet 0  . estradiol (ESTRACE) 1 MG tablet TAKE 1 TABLET BY MOUTH TWICE A DAY 180 tablet 1  . fenofibrate 160 MG tablet TAKE 1 TABLET BY MOUTH DAILY 90 tablet 1  . fluticasone (FLONASE) 50 MCG/ACT nasal spray Place 2 sprays into both nostrils daily. 16 g 2  . gabapentin (NEURONTIN) 400 MG capsule Take 400 mg by mouth 3 (three) times daily.    [name] glimepiride (AMARYL) 4 MG tablet Take 1 tablet (4 mg total) by mouth 2 (two) times daily. 60 tablet 5  . hydrochlorothiazide (HYDRODIURIL) 25 MG tablet TAKE 1 TABLET (25 MG TOTAL) BY MOUTH DAILY. 90 tablet 1  . HYDROcodone-acetaminophen (NORCO) 10-325 MG tablet Take 1 tablet by mouth 2 (two) times daily.    . Insulin Pen Needle (B-D UF III MINI PEN NEEDLES) 31G X 5 MM MISC Use to inject insulin 2 times daily 180 each 3  . lamoTRIgine (LAMICTAL)  200 MG tablet Take 200 mg by mouth 2 (two) times daily.    . Lancets 30G MISC Use one lancet each time sugars are checked. Pt tests twice daily. DX E11.9 100 each 2  . LANTUS SOLOSTAR 100 UNIT/ML Solostar Pen INJECT 63 UNITS INTO THE SKIN DAILY AT 10 PM. 15 mL 0  . Liraglutide (VICTOZA) 18 MG/3ML SOPN Inject 0.3 mLs (1.8 mg total) into the skin daily. 6 mL 3  . lisinopril (PRINIVIL,ZESTRIL) 20 MG tablet Take 1 tablet (20 mg total) by mouth daily. 90 tablet 1  . metFORMIN (GLUCOPHAGE) 1000 MG tablet Take 1 tablet (1,000 mg total) by mouth 2 (two) times daily with a meal. 60 tablet 2  . Multiple Vitamins-Minerals (CENTRUM ULTRA WOMENS PO) Take by mouth.    [name] omeprazole (PRILOSEC) 40 MG capsule TAKE 1 CAPSULE BY MOUTH TWICE A DAY 60 capsule 5  . ONE TOUCH ULTRA TEST test strip TEST TWICE DAILY 100 each 2  . QVAR 80 MCG/ACT inhaler Inhale 2 puffs into the lungs 2 (two) times daily. 1 Inhaler 6  . ranitidine (ZANTAC) 300 MG tablet Take 1 tablet (300 mg total) by mouth at bedtime. 90 tablet 3  . SUMAtriptan (IMITREX) 100 MG tablet Take 1tab at earliest onset of headache.  May repeat x1 in 2 hours if headache persists or recurs. 9 tablet 0  . tiaGABine (GABITRIL) 4 MG tablet Take 8 mg by mouth 2 (two) times daily.     No current facility-administered medications for this visit.    Family History  Problem Relation Age of Onset  . Hyperlipidemia Mother   . Diabetes Mother   . Anxiety disorder Mother   . Heart disease Mother   . Hypertension Father   . Lupus Father   . Heart disease Father   . Stroke Maternal Aunt   . Stroke Paternal Grandfather   . Mental illness Paternal Grandfather     ROS:  Pertinent items are noted in HPI.  Otherwise, a comprehensive ROS was negative.  Exam:   BP 130/70 mmHg  Pulse 80  Resp 18  Ht  (1.727 m)  Wt 352 lb (159.666 kg)  BMI 53.53 kg/m2  LMP 01/25/2008  Weight change:  stable  Height:  (172.7 cm)  Ht Readings from Last 3 Encounters:   08/30/15  (1.727 m)  08/16/15  (1.753 m)  08/16/15  (1.753 m)    General appearance: alert, cooperative and appears stated age  Head: Normocephalic, without obvious abnormality, atraumatic Neck: no adenopathy, supple, symmetrical, trachea midline and thyroid normal to inspection and palpation Lungs: clear to auscultation bilaterally Breasts: normal appearance, no masses or tenderness Heart: regular rate and rhythm Abdomen: soft, non-tender; bowel sounds normal; no masses,  no organomegaly Extremities: extremities normal, atraumatic, no cyanosis or edema Skin: Skin color, texture, turgor normal. No rashes or lesions Lymph nodes: Cervical, supraclavicular, and axillary nodes normal. No abnormal inguinal nodes palpated Neurologic: Grossly normal   Pelvic: External genitalia:  no lesions              Urethra:  normal appearing urethra with no masses, tenderness or lesions              Bartholins and Skenes: normal                 Vagina: normal appearing vagina with normal color and discharge, no lesions              Cervix: absent              Pap taken: No. Bimanual Exam:  Uterus:  uterus absent              Adnexa: no mass, fullness, tenderness               Rectovaginal: Confirms               Anus:  normal sphincter tone, no lesions  Chaperone was present for exam.  Discussion:  Voiced to pt my concerns about her weight and medical issues that are related.  As she is only 37, she is looking at issues with hips, knees, ankles, as well as issues related to her diabetes (which is not well controlled with last HbA1C 1/17 of 10.4), OSA, and hypertension.  Feel real weight loss would help her stop many of her medications and help her overall being.  D/W pt bariatric surgery as this would be the best method to really get off significant weight.  The bariatric pathway and local programs reviewed.  Pt does have interest in this and is appreciative of the information.   Brochures and telephone numbers given.  She is aware she will have to go to an information session first.  She is willing to consider this.    A:  Well Woman with normal exam TLH/BSO 01/2008 secondary to fibroids, DUB, pelvic pain On HRT Morbid obesity with increase sedentary job this past year Hypertension Diabetes Elevated lipids H/o bipolar d/o OSA H/O migraines  P: Mammogram recommendations discussed with pt Pap smear not indicated due to hysterectomy hx Estradiol 1.0mg  BID  #180/4RF.  Labs/vaccines done with Dr. Woodroe Chen Has regular follow-up with Pulmonology Uses Clobetasol 0.05% ointment to use bid for up to 7 days with vulvar itching   AEX 1 year or f/u prn  ~15 minutes spent with patient in face to face discussion about weight loss needs, medical problems that are related to weight and options for treatment.  Brochures and numbers provided as well as offering additional support or discussion if she feels this would be helpful.

## 2015-09-03 ENCOUNTER — Other Ambulatory Visit: Payer: Self-pay | Admitting: Orthopedic Surgery

## 2015-09-03 DIAGNOSIS — M25511 Pain in right shoulder: Secondary | ICD-10-CM

## 2015-09-03 MED FILL — MELOXICAM 15 MG TABLET: 15 | 30 days supply | Qty: 30 | Fill #0

## 2015-09-07 MED ORDER — ESTRADIOL 1 MG PO TABS: ORAL_TABLET | ORAL | Status: DC

## 2015-09-07 MED FILL — QVAR 80 MCG ORAL INHALER: 80 | 30 days supply | Qty: 9 | Fill #0

## 2015-09-10 ENCOUNTER — Encounter: Payer: Self-pay | Admitting: General Practice

## 2015-09-10 ENCOUNTER — Ambulatory Visit
Admission: RE | Admit: 2015-09-10 | Discharge: 2015-09-10 | Disposition: A | Discharge status: Home / Self Care | Payer: 59 | Source: Ambulatory Visit | Attending: Orthopedic Surgery | Admitting: Orthopedic Surgery

## 2015-09-10 DIAGNOSIS — M75111 Incomplete rotator cuff tear or rupture of right shoulder, not specified as traumatic: Secondary | ICD-10-CM | POA: Diagnosis not present

## 2015-09-10 DIAGNOSIS — M25511 Pain in right shoulder: Secondary | ICD-10-CM

## 2015-09-10 MED FILL — tiaGABine HCL 4 MG TABS: 4 | 90 days supply | Qty: 360 | Fill #0

## 2015-09-10 MED FILL — TEGRETOL XR 200 MG TABLET: 200 | 30 days supply | Qty: 90 | Fill #0

## 2015-09-13 DIAGNOSIS — M25511 Pain in right shoulder: Secondary | ICD-10-CM | POA: Diagnosis not present

## 2015-09-18 DIAGNOSIS — F319 Bipolar disorder, unspecified: Secondary | ICD-10-CM | POA: Diagnosis not present

## 2015-09-18 MED FILL — lamoTRIgine 200 MG TABS: 200 | 30 days supply | Qty: 60 | Fill #1

## 2015-09-18 MED FILL — OMEPRAZOLE DR 40 MG CAPSULE: 40 | 30 days supply | Qty: 60 | Fill #3

## 2015-09-19 ENCOUNTER — Other Ambulatory Visit: Payer: Self-pay | Admitting: Internal Medicine

## 2015-09-20 ENCOUNTER — Other Ambulatory Visit: Payer: Self-pay | Admitting: *Deleted

## 2015-09-20 ENCOUNTER — Encounter: Payer: Self-pay | Admitting: Internal Medicine

## 2015-09-20 MED ORDER — INSULIN GLARGINE 100 UNIT/ML SOLOSTAR PEN: PEN_INJECTOR | SUBCUTANEOUS | Status: DC

## 2015-09-20 MED FILL — LANTUS SOLOSTAR 100 UNITS/M: 100 | 48 days supply | Qty: 30 | Fill #0

## 2015-09-23 DIAGNOSIS — G4733 Obstructive sleep apnea (adult) (pediatric): Secondary | ICD-10-CM | POA: Diagnosis not present

## 2015-09-28 ENCOUNTER — Other Ambulatory Visit: Payer: Self-pay | Admitting: Internal Medicine

## 2015-09-28 MED FILL — GLIMEPIRIDE 4 MG TABLET: 4 | 30 days supply | Qty: 60 | Fill #0

## 2015-10-01 MED FILL — GABAPENTIN 400 MG CAPSULE: 400 | 30 days supply | Qty: 90 | Fill #1

## 2015-10-01 MED FILL — metFORMIN HCL 1000 MG TABS: 1000 | 30 days supply | Qty: 60 | Fill #2

## 2015-10-01 MED FILL — MELOXICAM 15 MG TABLET: 15 | 30 days supply | Qty: 30 | Fill #1

## 2015-10-01 MED FILL — ESTRADIOL 1 MG TABLET: 1 | 90 days supply | Qty: 180 | Fill #1

## 2015-10-05 ENCOUNTER — Other Ambulatory Visit: Payer: Self-pay | Admitting: *Deleted

## 2015-10-05 ENCOUNTER — Ambulatory Visit (INDEPENDENT_AMBULATORY_CARE_PROVIDER_SITE_OTHER): Payer: 59 | Admitting: Internal Medicine

## 2015-10-05 ENCOUNTER — Other Ambulatory Visit (INDEPENDENT_AMBULATORY_CARE_PROVIDER_SITE_OTHER): Payer: 59 | Admitting: *Deleted

## 2015-10-05 ENCOUNTER — Encounter: Payer: Self-pay | Admitting: Internal Medicine

## 2015-10-05 VITALS — BP 122/80 | HR 81 | Wt 354.6 lb

## 2015-10-05 DIAGNOSIS — E1165 Type 2 diabetes mellitus with hyperglycemia: Secondary | ICD-10-CM | POA: Diagnosis not present

## 2015-10-05 DIAGNOSIS — E119 Type 2 diabetes mellitus without complications: Secondary | ICD-10-CM | POA: Insufficient documentation

## 2015-10-05 DIAGNOSIS — Z794 Long term (current) use of insulin: Secondary | ICD-10-CM | POA: Diagnosis not present

## 2015-10-05 LAB — POCT GLYCOSYLATED HEMOGLOBIN (HGB A1C): HEMOGLOBIN A1C: 9.2

## 2015-10-05 MED ORDER — LIRAGLUTIDE 18 MG/3ML ~~LOC~~ SOPN: 1.8000 mg | PEN_INJECTOR | Freq: Every day | SUBCUTANEOUS | Status: DC

## 2015-10-05 MED FILL — VICTOZA 18 MG/3 ML INJECT P: 18 | 30 days supply | Qty: 9 | Fill #0

## 2015-10-05 NOTE — Patient Instructions (Addendum)
Please move: - Metformin all at dinnertime (2000 mg)  Continue: - Lantus 63 units at bedtime - Amaryl 4 mg 2x a day - Victoza 1.8 mg daily   Continue to improve your diet.   Continue to check sugars 2x a day, rotating check times.  Please return in 3 months with your sugar log.

## 2015-10-05 NOTE — Progress Notes (Signed)
Patient ID: [name], female   DOB: Sep 15, 1970, 45 y.o.   MRN: [id number]  HPI: [name] is a 45 y.o.-year-old female, returning for f/u for DM2, dx in 2001, insulin-dependent 2007, uncontrolled, without complications. Last visit 4 mo ago.  She had a torn meniscus in L knee after a fall 07/2015 >> needs surgery >> but HbA1c <9%. She also has shoulder pain. She started Meloxicam.  She was on Weight Watchers diet - did not follow through as she could not go to the meetings after she fell in 07/2015.  She started to watch her diet and takes the medicines consistently now >> sugars better in last 3 weeks and she feels better. She also lost weight in last 2 months!  Last hemoglobin A1c was: Lab Results  Component Value Date   HGBA1C 10.4 05/31/2015   HGBA1C 8.8 01/09/2015   HGBA1C 11.0* 06/21/2014   Pt is on a regimen of: - Metformin 1000 mg 2x a day, with meals - Lantus 63 units at bedtime - Victoza 1.8 mg daily - Amaryl 4 mg 2x a day She had frequent yeast inf when sugars were higher before.   Pt checks sugars 1-3x a day more recently >> significant improvement in last 2 weeks: - am: 120-130 >> 147 - 2h after b'fast: n/c >> 145-176 - before lunch: n/c >> 82-167 - 2h after lunch: n/c >> 106-166 - before dinner: n/c >> 86-110 - 2h after dinner: n/c >> 145 - bedtime: 107, 125-180 when having a good dinner; if goes out: 250-260 >> n/c - nighttime: n/c No lows. Lowest sugar was 89 >> 99 >> 262; she has hypoglycemia awareness at 105.  Highest sugar was 400-500 >> 300s >> 200s.  Glucometer: One Touch Ultra 2  Pt's meals are: - Breakfast: 2 eggs + English muffin or waffle - Lunch: salad + frozen dinner (low carb); chicken + green beans; sandwich + veggies - Dinner: meat + veggies + starch (1 serving) - Snacks: protein shakes - 4g carbs (160 cal) - 2x a week; popsicle after dinner; apple sauce    - no CKD, last BUN/creatinine:  Lab Results  Component Value Date   BUN 19  08/16/2015   CREATININE 0.62 08/16/2015  On Lisinopril. - last set of lipids: Lab Results  Component Value Date   CHOL 200 07/27/2015   HDL 55.70 07/27/2015   LDLCALC 110* 07/27/2015   LDLDIRECT 112.0 06/21/2014   TRIG 168.0* 07/27/2015   CHOLHDL 4 07/27/2015  On Atorvastatin.  - last eye exam was 08/2015. No DR. Dr Emily Filbert.  - no numbness and tingling in her feet. She had slight sxs 4-5 mo ago.  On ASA 81.   ROS: Constitutional: no weight gain/loss, no fatigue, no subjective hyperthermia/hypothermia Eyes: no blurry vision, no xerophthalmia ENT: no sore throat, no nodules palpated in throat, no dysphagia/odynophagia, no hoarseness Cardiovascular: no CP/SOB/palpitations/leg swelling Respiratory: no cough/SOB Gastrointestinal: no N/V/D/C Musculoskeletal: no muscle/+ joint aches Skin: no rashes Neurological: no tremors/numbness/tingling/dizziness  I reviewed pt's medications, allergies, PMH, social hx, family hx, and changes were documented in the history of present illness. Otherwise, unchanged from my initial visit note.  Past Medical History  Diagnosis Date  . Asthma     pt stated treated as ashtma but not really asthma  . Chicken pox   . Depression   . Diabetes mellitus   . GERD (gastroesophageal reflux disease)   . Allergy   . Hypertension     readings  . Hyperlipidemia   .  Migraine   . Irregular heartbeat   . Sleep apnea   . Bipolar disorder [facility](HCC)    Past Surgical History  Procedure Laterality Date  . Cesarean section      1995  . Thyroidectomy, partial  2001    removed left  . Laparoscopic assisted vaginal hysterectomy  2009      BSO fibroids, DUB, pelvic pain  . Back surgery  1999  . Ankle surgery  1989    left  . Umbilical hernia repair  2003  . Oophorectomy Bilateral 2009    cyst   Social History   Social History  . Marital Status: Married    Spouse Name: N/A  . Number of Children: 1   Occupational History  . Registrar, [employer]    Social History Main Topics  . Smoking status: Never Smoker   . Smokeless tobacco: Never Used  . Alcohol Use: No  . Drug Use: No   Current Outpatient Prescriptions on File Prior to Visit  Medication Sig Dispense Refill  . albuterol (PROVENTIL HFA) 108 (90 BASE) MCG/ACT inhaler INHALE 2 PUFFS INTO THE LUNGS EVERY 4 HOURS AS NEEDED FOR WHEEZING OR SHORTNESS OF BREATH 6.7 g 3  . ARIPiprazole (ABILIFY) 5 MG tablet Take 1 tablet by mouth daily.  2  . aspirin 81 MG tablet Take 160 mg by mouth daily.    [name]. atorvastatin (LIPITOR) 40 MG tablet TAKE 1 TABLET BY MOUTH DAILY 90 tablet 1  . Calcium Carbonate-Vitamin D (CALTRATE 600+D PO) Take 1 tablet by mouth 2 (two) times daily.    . carbamazepine (TEGRETOL XR) 200 MG 12 hr tablet Take 200 mg by mouth. 1 tablet in the morning, 2 at night    . carvedilol (COREG) 25 MG tablet TAKE 1 TABLET BY MOUTH 2 TIMES DAILY. 180 tablet 1  . cholecalciferol (VITAMIN D) 1000 UNITS tablet Take 2,000 Units by mouth daily.    . clobetasol ointment (TEMOVATE) 0.05 % Apply 1 application topically 2 (two) times daily. Do not use for more than 7 days 60 g 1  . cyclobenzaprine (FLEXERIL) 10 MG tablet Take 1 tablet (10 mg total) by mouth 3 (three) times daily as needed for muscle spasms. 30 tablet 0  . estradiol (ESTRACE) 1 MG tablet TAKE 1 TABLET BY MOUTH TWICE A DAY 180 tablet 4  . fenofibrate 160 MG tablet TAKE 1 TABLET BY MOUTH DAILY 90 tablet 1  . fluticasone (FLONASE) 50 MCG/ACT nasal spray Place 2 sprays into both nostrils daily. 16 g 2  . gabapentin (NEURONTIN) 400 MG capsule Take 400 mg by mouth 3 (three) times daily.    [name]. glimepiride (AMARYL) 4 MG tablet TAKE 1 TABLET (4 MG TOTAL) BY MOUTH 2 TIMES DAILY. 60 tablet 5  . hydrochlorothiazide (HYDRODIURIL) 25 MG tablet TAKE 1 TABLET (25 MG TOTAL) BY MOUTH DAILY. 90 tablet 1  . HYDROcodone-acetaminophen (NORCO) 10-325 MG tablet Take 1 tablet by mouth 2 (two) times daily.    . Insulin Glargine (LANTUS SOLOSTAR) 100  UNIT/ML Solostar Pen INJECT 63 UNITS INTO THE SKIN DAILY AT 10 PM. 30 mL 1  . Insulin Pen Needle (B-D UF III MINI PEN NEEDLES) 31G X 5 MM MISC Use to inject insulin 2 times daily 180 each 3  . lamoTRIgine (LAMICTAL) 200 MG tablet Take 200 mg by mouth 2 (two) times daily.    . Lancets 30G MISC Use one lancet each time sugars are checked. Pt tests twice daily. DX E11.9 100 each 2  .  lisinopril (PRINIVIL,ZESTRIL) 20 MG tablet Take 1 tablet (20 mg total) by mouth daily. 90 tablet 1  . metFORMIN (GLUCOPHAGE) 1000 MG tablet Take 1 tablet (1,000 mg total) by mouth 2 (two) times daily with a meal. 60 tablet 2  . Multiple Vitamins-Minerals (CENTRUM ULTRA WOMENS PO) Take by mouth.    [name] omeprazole (PRILOSEC) 40 MG capsule TAKE 1 CAPSULE BY MOUTH TWICE A DAY 60 capsule 5  . ONE TOUCH ULTRA TEST test strip TEST TWICE DAILY 100 each 2  . QVAR 80 MCG/ACT inhaler Inhale 2 puffs into the lungs 2 (two) times daily. 1 Inhaler 6  . ranitidine (ZANTAC) 300 MG tablet Take 1 tablet (300 mg total) by mouth at bedtime. 90 tablet 3  . SUMAtriptan (IMITREX) 100 MG tablet Take 1tab at earliest onset of headache.  May repeat x1 in 2 hours if headache persists or recurs. 9 tablet 0  . tiaGABine (GABITRIL) 4 MG tablet Take 8 mg by mouth 2 (two) times daily.     No current facility-administered medications on file prior to visit.   No Known Allergies Family History  Problem Relation Age of Onset  . Hyperlipidemia Mother   . Diabetes Mother   . Anxiety disorder Mother   . Heart disease Mother   . Hypertension Father   . Lupus Father   . Heart disease Father   . Stroke Maternal Aunt   . Stroke Paternal Grandfather   . Mental illness Paternal Grandfather    PE: BP 122/80 mmHg  Pulse 81  Wt 354 lb 9.6 oz (160.846 kg)  SpO2 95%  LMP 01/25/2008 Wt Readings from Last 3 Encounters:  10/05/15 354 lb 9.6 oz (160.846 kg)  08/30/15 352 lb (159.666 kg)  08/16/15 363 lb (164.656 kg)   Constitutional: obese in NAD Eyes:  PERRLA, EOMI, no exophthalmos ENT: moist mucous membranes, no thyromegaly, no cervical lymphadenopathy Cardiovascular: RRR, No MRG Respiratory: CTA B Gastrointestinal: abdomen soft, NT, ND, BS+ Musculoskeletal: no deformities, strength intact in all 4 Skin: moist, warm, no rashes Neurological: no tremor with outstretched hands, DTR normal in all 4  ASSESSMENT: 1. DM2, insulin-dependent, uncontrolled, without complications  PLAN:  1. Patient with long-standing, uncontrolled diabetes, on oral antidiabetic regimen + basal insulin, with now much better control of her sugars after recently changing her diet and taking her meds as advised. Will move all Metformin at dinnertime as sugars in am are higher. - we can add Invokana in the future, but not needed for now - again discuss about healthier meals  - I advised her to:  Patient Instructions  Please move: - Metformin all at dinnertime (2000 mg)  Continue: - Lantus 63 units at bedtime - Amaryl 4 mg 2x a day - Victoza 1.8 mg daily   Continue to improve your diet.   Continue to check sugars 2x a day, rotating check times.  Please return in 3 months with your sugar log.   - check sugars at different times of the day - check 2 times a day, rotating checks - advised for yearly eye exams >> she is UTD - check HbA1c today >> 9.2% (decreased, but not at goal for surgery yet)  - Return to clinic in 3 mo with sugar log

## 2015-10-10 MED FILL — TEGRETOL XR 200 MG TABLET: 200 | 30 days supply | Qty: 90 | Fill #0

## 2015-10-10 MED FILL — QVAR 80 MCG ORAL INHALER: 80 | 30 days supply | Qty: 9 | Fill #1

## 2015-10-10 MED FILL — ARIPiprazole 5 MG TABS: 5 | 30 days supply | Qty: 30 | Fill #0

## 2015-10-13 ENCOUNTER — Telehealth: Payer: 59 | Admitting: Nurse Practitioner

## 2015-10-13 DIAGNOSIS — R05 Cough: Secondary | ICD-10-CM | POA: Diagnosis not present

## 2015-10-13 DIAGNOSIS — R059 Cough, unspecified: Secondary | ICD-10-CM

## 2015-10-13 MED ORDER — AZITHROMYCIN 250 MG PO TABS: ORAL_TABLET | ORAL | Status: DC

## 2015-10-13 NOTE — Progress Notes (Signed)

## 2015-10-15 ENCOUNTER — Other Ambulatory Visit: Payer: Self-pay | Admitting: Family Medicine

## 2015-10-15 MED FILL — VENTOLIN HFA 90 MCG INHALER: 108 (90 BAS | 30 days supply | Qty: 18 | Fill #0

## 2015-10-15 NOTE — Telephone Encounter (Signed)
Medication filled to pharmacy as requested.   

## 2015-10-18 MED FILL — raNITIdine HCL 300 MG TABS: 300 | 90 days supply | Qty: 90 | Fill #1

## 2015-10-18 MED FILL — OMEPRAZOLE DR 40 MG CAPSULE: 40 | 30 days supply | Qty: 60 | Fill #4

## 2015-10-18 MED FILL — lamoTRIgine 200 MG TABS: 200 | 30 days supply | Qty: 60 | Fill #0

## 2015-10-18 MED FILL — LISINOPRIL 20 MG TABLET: 20 | 90 days supply | Qty: 90 | Fill #1

## 2015-10-24 DIAGNOSIS — G4733 Obstructive sleep apnea (adult) (pediatric): Secondary | ICD-10-CM | POA: Diagnosis not present

## 2015-10-29 ENCOUNTER — Other Ambulatory Visit: Payer: Self-pay | Admitting: Internal Medicine

## 2015-10-29 ENCOUNTER — Other Ambulatory Visit: Payer: Self-pay | Admitting: Family Medicine

## 2015-10-29 MED FILL — HYDROCHLOROTHIAZIDE 25 MG T: 25 | 90 days supply | Qty: 90 | Fill #0

## 2015-10-29 MED FILL — BD PEN NDL MINI 31GX5MM: 31G X 5 MM | 50 days supply | Qty: 200 | Fill #2

## 2015-10-29 MED FILL — metFORMIN HCL 1000 MG TABS: 1000 | 90 days supply | Qty: 180 | Fill #0

## 2015-10-29 MED FILL — GABAPENTIN 400 MG CAPSULE: 400 | 30 days supply | Qty: 90 | Fill #0

## 2015-10-29 MED FILL — GLIMEPIRIDE 4 MG TABLET: 4 | 30 days supply | Qty: 60 | Fill #1

## 2015-10-29 MED FILL — MELOXICAM 15 MG TABLET: 15 | 30 days supply | Qty: 30 | Fill #2

## 2015-10-29 NOTE — Telephone Encounter (Signed)
Medication filled to pharmacy as requested.   

## 2015-10-30 DIAGNOSIS — F319 Bipolar disorder, unspecified: Secondary | ICD-10-CM | POA: Diagnosis not present

## 2015-11-07 ENCOUNTER — Other Ambulatory Visit: Payer: Self-pay | Admitting: Medical

## 2015-11-07 MED FILL — TEGRETOL XR 200 MG TABLET: 200 | 30 days supply | Qty: 90 | Fill #0

## 2015-11-07 MED FILL — VICTOZA 18 MG/3 ML INJECT P: 18 | 30 days supply | Qty: 9 | Fill #1

## 2015-11-07 MED FILL — LANTUS SOLOSTAR 100 UNITS/M: 100 | 48 days supply | Qty: 30 | Fill #1

## 2015-11-07 MED FILL — QVAR 80 MCG ORAL INHALER: 80 | 30 days supply | Qty: 9 | Fill #0

## 2015-11-19 MED FILL — lamoTRIgine 200 MG TABS: 200 | 30 days supply | Qty: 60 | Fill #0

## 2015-11-20 MED FILL — ARIPiprazole 5 MG TABS: 5 | 30 days supply | Qty: 30 | Fill #0

## 2015-11-23 DIAGNOSIS — G4733 Obstructive sleep apnea (adult) (pediatric): Secondary | ICD-10-CM | POA: Diagnosis not present

## 2015-11-26 MED FILL — GLIMEPIRIDE 4 MG TABLET: 4 | 30 days supply | Qty: 60 | Fill #2

## 2015-12-03 ENCOUNTER — Telehealth: Payer: 59 | Admitting: Family

## 2015-12-03 DIAGNOSIS — J209 Acute bronchitis, unspecified: Secondary | ICD-10-CM

## 2015-12-03 MED ORDER — AZITHROMYCIN 250 MG PO TABS: ORAL_TABLET | ORAL | Status: DC

## 2015-12-03 NOTE — Addendum Note (Signed)
Addended by: Jannifer RodneyHAWKS, Rosabelle Jupin A on: 12/03/2015 09:27 PM   Modules accepted: Orders

## 2015-12-03 NOTE — Progress Notes (Signed)

## 2015-12-04 DIAGNOSIS — F3162 Bipolar disorder, current episode mixed, moderate: Secondary | ICD-10-CM | POA: Diagnosis not present

## 2015-12-04 MED FILL — AZITHROMYCIN 250 MG TABLET: 250 | 5 days supply | Qty: 6 | Fill #0

## 2015-12-06 MED FILL — OMEPRAZOLE DR 40 MG CAPSULE: 40 | 30 days supply | Qty: 60 | Fill #5

## 2015-12-06 MED FILL — CARVEDILOL 25 MG TABLET: 25 | 90 days supply | Qty: 180 | Fill #1

## 2015-12-06 MED FILL — MELOXICAM 15 MG TABLET: 15 | 30 days supply | Qty: 30 | Fill #0

## 2015-12-06 MED FILL — tiaGABine HCL 4 MG TABS: 4 | 90 days supply | Qty: 360 | Fill #0

## 2015-12-06 MED FILL — TEGRETOL XR 200 MG TABLET: 200 | 30 days supply | Qty: 90 | Fill #0

## 2015-12-13 ENCOUNTER — Other Ambulatory Visit: Payer: Self-pay | Admitting: Internal Medicine

## 2015-12-13 MED FILL — GABAPENTIN 400 MG CAPSULE: 400 | 30 days supply | Qty: 90 | Fill #0

## 2015-12-13 MED FILL — lamoTRIgine 200 MG TABS: 200 | 30 days supply | Qty: 60 | Fill #0

## 2015-12-13 MED FILL — FENOFIBRATE 160 MG TABLET: 160 | 90 days supply | Qty: 90 | Fill #1

## 2015-12-13 MED FILL — LANTUS SOLOSTAR 100 UNITS/M: 100 | 48 days supply | Qty: 30 | Fill #0

## 2015-12-13 MED FILL — ATORVASTATIN 40 MG TABLET: 40 | 90 days supply | Qty: 90 | Fill #1

## 2015-12-24 MED FILL — GLIMEPIRIDE 4 MG TABLET: 4 | 30 days supply | Qty: 60 | Fill #3

## 2015-12-24 MED FILL — ARIPiprazole 5 MG TABS: 5 | 30 days supply | Qty: 30 | Fill #0

## 2015-12-24 MED FILL — QVAR 80 MCG ORAL INHALER: 80 | 30 days supply | Qty: 9 | Fill #1

## 2015-12-25 ENCOUNTER — Other Ambulatory Visit: Payer: Self-pay | Admitting: Internal Medicine

## 2015-12-25 MED FILL — VICTOZA 18 MG/3 ML INJECT P: 18 | 30 days supply | Qty: 9 | Fill #0

## 2016-01-03 ENCOUNTER — Other Ambulatory Visit: Payer: Self-pay | Admitting: Family Medicine

## 2016-01-03 MED FILL — BD PEN NDL MINI 31GX5MM: 31G X 5 MM | 50 days supply | Qty: 100 | Fill #3

## 2016-01-03 MED FILL — OMEPRAZOLE DR 40 MG CAPSULE: 40 | 30 days supply | Qty: 60 | Fill #0

## 2016-01-03 MED FILL — ESTRADIOL 1 MG TABLET: 1 | 90 days supply | Qty: 180 | Fill #0

## 2016-01-03 MED FILL — TEGRETOL XR 200 MG TABLET: 200 | 30 days supply | Qty: 90 | Fill #1

## 2016-01-07 ENCOUNTER — Ambulatory Visit: Payer: 59 | Admitting: Internal Medicine

## 2016-01-08 ENCOUNTER — Telehealth: Payer: Self-pay | Admitting: Pulmonary Disease

## 2016-01-08 DIAGNOSIS — G4733 Obstructive sleep apnea (adult) (pediatric): Secondary | ICD-10-CM

## 2016-01-08 NOTE — Telephone Encounter (Signed)
Pt states she feels cpap pressure isn't strong enough due having trouble breathing and not feeling well rested in the mornings. Currently on set pressure of 11cm.  RA please advise. Thanks.

## 2016-01-08 NOTE — Telephone Encounter (Signed)
Called and spoke with pt and she is aware that [facility]. Nothing further is needed.

## 2016-01-08 NOTE — Telephone Encounter (Signed)
Please change to set pressure of 11 cm and obtain download in 2 weeks

## 2016-01-10 MED FILL — MELOXICAM 15 MG TABLET: 15 | 30 days supply | Qty: 30 | Fill #1

## 2016-01-10 MED FILL — lamoTRIgine 200 MG TABS: 200 | 30 days supply | Qty: 60 | Fill #1

## 2016-01-10 MED FILL — GABAPENTIN 400 MG CAPSULE: 400 | 30 days supply | Qty: 90 | Fill #1

## 2016-01-22 ENCOUNTER — Encounter: Payer: Self-pay | Admitting: Internal Medicine

## 2016-01-22 ENCOUNTER — Encounter: Payer: Self-pay | Admitting: Family Medicine

## 2016-01-22 ENCOUNTER — Other Ambulatory Visit: Payer: Self-pay | Admitting: Family Medicine

## 2016-01-22 ENCOUNTER — Other Ambulatory Visit: Payer: Self-pay | Admitting: Internal Medicine

## 2016-01-22 MED FILL — raNITIdine HCL 300 MG TABS: 300 | 90 days supply | Qty: 90 | Fill #2

## 2016-01-22 MED FILL — VICTOZA 18 MG/3 ML INJECT P: 18 | 30 days supply | Qty: 9 | Fill #1

## 2016-01-22 MED FILL — GLIMEPIRIDE 4 MG TABLET: 4 | 30 days supply | Qty: 60 | Fill #4

## 2016-01-22 MED FILL — AZITHROMYCIN 250 MG TABLET: 250 | 5 days supply | Qty: 6 | Fill #0

## 2016-01-22 MED FILL — LISINOPRIL 20 MG TABLET: 20 | 90 days supply | Qty: 90 | Fill #0

## 2016-01-22 MED FILL — ARIPiprazole 5 MG TABS: 5 | 30 days supply | Qty: 30 | Fill #1

## 2016-01-23 ENCOUNTER — Encounter: Payer: Self-pay | Admitting: Adult Health

## 2016-01-23 MED FILL — metFORMIN HCL 1000 MG TABS: 1000 | 30 days supply | Qty: 60 | Fill #0

## 2016-01-25 ENCOUNTER — Encounter: Payer: Self-pay | Admitting: Pulmonary Disease

## 2016-01-25 ENCOUNTER — Other Ambulatory Visit (INDEPENDENT_AMBULATORY_CARE_PROVIDER_SITE_OTHER): Payer: 59

## 2016-01-25 ENCOUNTER — Ambulatory Visit (INDEPENDENT_AMBULATORY_CARE_PROVIDER_SITE_OTHER): Payer: 59 | Admitting: Adult Health

## 2016-01-25 ENCOUNTER — Encounter: Payer: Self-pay | Admitting: Adult Health

## 2016-01-25 ENCOUNTER — Ambulatory Visit (INDEPENDENT_AMBULATORY_CARE_PROVIDER_SITE_OTHER)
Admission: RE | Admit: 2016-01-25 | Discharge: 2016-01-25 | Disposition: A | Discharge status: Home / Self Care | Payer: 59 | Source: Ambulatory Visit | Attending: Adult Health | Admitting: Adult Health

## 2016-01-25 DIAGNOSIS — G4733 Obstructive sleep apnea (adult) (pediatric): Secondary | ICD-10-CM

## 2016-01-25 DIAGNOSIS — R0600 Dyspnea, unspecified: Secondary | ICD-10-CM

## 2016-01-25 DIAGNOSIS — R0602 Shortness of breath: Secondary | ICD-10-CM | POA: Diagnosis not present

## 2016-01-25 LAB — CBC WITH DIFFERENTIAL/PLATELET
BASOS ABS: 0 10*3/uL (ref 0.0–0.1)
Basophils Relative: 0.3 % (ref 0.0–3.0)
EOS ABS: 0.4 10*3/uL (ref 0.0–0.7)
Eosinophils Relative: 4.1 % (ref 0.0–5.0)
HCT: 39.8 % (ref 36.0–46.0)
Hemoglobin: 13.5 g/dL (ref 12.0–15.0)
LYMPHS ABS: 2.3 10*3/uL (ref 0.7–4.0)
Lymphocytes Relative: 26.9 % (ref 12.0–46.0)
MCHC: 33.8 g/dL (ref 30.0–36.0)
MCV: 84.8 fl (ref 78.0–100.0)
Monocytes Absolute: 0.6 10*3/uL (ref 0.1–1.0)
Monocytes Relative: 7.6 % (ref 3.0–12.0)
NEUTROS ABS: 5.2 10*3/uL (ref 1.4–7.7)
NEUTROS PCT: 61.1 % (ref 43.0–77.0)
PLATELETS: 310 10*3/uL (ref 150.0–400.0)
RBC: 4.7 Mil/uL (ref 3.87–5.11)
RDW: 14.7 % (ref 11.5–15.5)
WBC: 8.5 10*3/uL (ref 4.0–10.5)

## 2016-01-25 LAB — BRAIN NATRIURETIC PEPTIDE: Pro B Natriuretic peptide (BNP): 22 pg/mL (ref 0.0–100.0)

## 2016-01-25 NOTE — Patient Instructions (Addendum)
Continue on CPAP At bedtime   Goal is to wear for at least 4 hr each night.  Do not drive if sleepy  Work on weight loss.  Chest xray and labs .  Follow up Dr. Vassie LollAlva 6 -8 weeks with PFT and As needed   Please contact office for sooner follow up if symptoms do not improve or worsen or seek emergency care

## 2016-01-25 NOTE — Assessment & Plan Note (Signed)
Dyspnea-for years ? Etiology  Check cxr today  Labs with cbc, and bnp  Check PFT on return

## 2016-01-25 NOTE — Addendum Note (Signed)
Addended by: Karalee HeightOX, Douglas Smolinsky P on: 01/25/2016 03:49 PM   Modules accepted: Orders

## 2016-01-25 NOTE — Progress Notes (Signed)
Called spoke with pt. Reviewed results and rec. Pt voiced understanding and had no further.

## 2016-01-25 NOTE — Progress Notes (Signed)
Subjective:    Patient ID: [name], female    DOB: 04-02-1971, 45 y.o.   MRN: [id number]  HPI 45 yo female morbidly obese with  Moderate OSA  S/p gastric bypass dropped from 370 to 200 but regained the weight back.   TEST  PSG in 2008 AHI 24/h    01/25/2016  Follow up : OSA  Pt returns for 6 month follow up for sleep apnea.  Uses CPAP on average 5-6 hours nightly, mask fits well.  Was having a lot of mask issue last ov, this has worked out.  Recently felt pressure was not enough . Now on CPAP 11cmH2o  Feels this is helping .  Download looks good with avg usage at 6hr each night. Compliance was good.  AHI 0.3. Min leaks.  Denies chest pain, orthopnea, edema or fever.   Complains for last few years that she gets short of breath intermittently .  Comes and goes. Has been treated by PCP for ?RAD with QVAR . Does not feel it  Helps that much . Occasionally uses proair with little change as well.  She says she had asthma test years ago and was told she did not have asthma.  On Coreg and ACE . Denies cough or wheezing .  Patient denies any chest pain, syncope, hemoptysis, nausea, vomiting, diarrhea, calf pain, or increased edema   Past Medical History:  Diagnosis Date  . Allergy   . Asthma    pt stated treated as ashtma but not really asthma  . Bipolar disorder ([name])   . Chicken pox   . Depression   . Diabetes mellitus   . GERD (gastroesophageal reflux disease)   . Hyperlipidemia   . Hypertension    readings  . Irregular heartbeat   . Migraine   . Sleep apnea    Current Outpatient Prescriptions on File Prior to Visit  Medication Sig Dispense Refill  . ARIPiprazole (ABILIFY) 5 MG tablet Take 1 tablet by mouth daily.  2  . aspirin 81 MG tablet Take 160 mg by mouth daily.    [name] atorvastatin (LIPITOR) 40 MG tablet TAKE 1 TABLET BY MOUTH DAILY 90 tablet 1  . azithromycin (ZITHROMAX Z-PAK) 250 MG tablet As directed 1 each 0  . Calcium Carbonate-Vitamin D (CALTRATE 600+D PO)  Take 1 tablet by mouth 2 (two) times daily.    . carbamazepine (TEGRETOL XR) 200 MG 12 hr tablet Take 200 mg by mouth. 1 tablet in the morning, 2 at night    . carvedilol (COREG) 25 MG tablet TAKE 1 TABLET BY MOUTH 2 TIMES DAILY. 180 tablet 1  . cholecalciferol (VITAMIN D) 1000 UNITS tablet Take 2,000 Units by mouth daily.    . clobetasol ointment (TEMOVATE) 0.05 % Apply 1 application topically 2 (two) times daily. Do not use for more than 7 days 60 g 1  . cyclobenzaprine (FLEXERIL) 10 MG tablet Take 1 tablet (10 mg total) by mouth 3 (three) times daily as needed for muscle spasms. 30 tablet 0  . estradiol (ESTRACE) 1 MG tablet TAKE 1 TABLET BY MOUTH TWICE A DAY 180 tablet 4  . fenofibrate 160 MG tablet TAKE 1 TABLET BY MOUTH DAILY 90 tablet 1  . fluticasone (FLONASE) 50 MCG/ACT nasal spray Place 2 sprays into both nostrils daily. 16 g 2  . gabapentin (NEURONTIN) 400 MG capsule Take 400 mg by mouth 3 (three) times daily.    [name] glimepiride (AMARYL) 4 MG tablet TAKE 1 TABLET (4 MG  TOTAL) BY MOUTH 2 TIMES DAILY. 60 tablet 5  . hydrochlorothiazide (HYDRODIURIL) 25 MG tablet TAKE 1 TABLET (25 MG TOTAL) BY MOUTH DAILY. 90 tablet 1  . HYDROcodone-acetaminophen (NORCO) 10-325 MG tablet Take 1 tablet by mouth 2 (two) times daily.    . Insulin Pen Needle (B-D UF III MINI PEN NEEDLES) 31G X 5 MM MISC Use to inject insulin 2 times daily 180 each 3  . lamoTRIgine (LAMICTAL) 200 MG tablet Take 200 mg by mouth 2 (two) times daily.    . Lancets 30G MISC Use one lancet each time sugars are checked. Pt tests twice daily. DX E11.9 100 each 2  . LANTUS SOLOSTAR 100 UNIT/ML Solostar Pen INJECT 63 UNITS INTO THE SKIN DAILY AT 10 PM. 30 mL 3  . lisinopril (PRINIVIL,ZESTRIL) 20 MG tablet TAKE 1 TABLET (20 MG TOTAL) BY MOUTH DAILY. 90 tablet 1  . meloxicam (MOBIC) 15 MG tablet   2  . metFORMIN (GLUCOPHAGE) 1000 MG tablet TAKE 1 TABLET (1,000 MG TOTAL) BY MOUTH 2 TIMES DAILY WITH MEAL 60 tablet 2  . Multiple  Vitamins-Minerals (CENTRUM ULTRA WOMENS PO) Take by mouth.    [name] omeprazole (PRILOSEC) 40 MG capsule TAKE 1 CAPSULE BY MOUTH TWICE A DAY 60 capsule 5  . ONE TOUCH ULTRA TEST test strip TEST TWICE DAILY 100 each 2  . QVAR 80 MCG/ACT inhaler INHALE 2 PUFFS BY MOUTH INTO THE LUNGS 2 (TWO) TIMES DAILY. 8.7 g 6  . ranitidine (ZANTAC) 300 MG tablet Take 1 tablet (300 mg total) by mouth at bedtime. 90 tablet 3  . SUMAtriptan (IMITREX) 100 MG tablet Take 1tab at earliest onset of headache.  May repeat x1 in 2 hours if headache persists or recurs. 9 tablet 0  . tiaGABine (GABITRIL) 4 MG tablet Take 8 mg by mouth 2 (two) times daily.    . VENTOLIN HFA 108 (90 Base) MCG/ACT inhaler INHALE 2 PUFFS BY MOUTH INTO THE LUNGS EVERY 4 HOURS AS NEEDED FOR WHEEZING OR SHORTNESS OF BREATH 18 g 3  . VICTOZA 18 MG/3ML SOPN INJECT 0.3 MLS (1.8 MG TOTAL) INTO THE SKIN DAILY. 9 mL 1   No current facility-administered medications on file prior to visit.      Review of Systems Constitutional:   No  weight loss, night sweats,  Fevers, chills,  +fatigue, or  lassitude.  HEENT:   No headaches,  Difficulty swallowing,  Tooth/dental problems, or  Sore throat,                No sneezing, itching, ear ache, nasal congestion, post nasal drip,   CV:  No chest pain,  Orthopnea, PND, swelling in lower extremities, anasarca, dizziness, palpitations, syncope.   GI  No heartburn, indigestion, abdominal pain, nausea, vomiting, diarrhea, change in bowel habits, loss of appetite, bloody stools.   Resp:    No chest wall deformity  Skin: no rash or lesions.  GU: no dysuria, change in color of urine, no urgency or frequency.  No flank pain, no hematuria   MS:  No joint pain or swelling.  No decreased range of motion.  No back pain.  Psych:  No change in mood or affect. No depression or anxiety.  No memory loss.         Objective:   Physical Exam  Vitals:   01/25/16 1518  BP: 128/80  Pulse: 75  Temp: 98.5 F (36.9 C)    TempSrc: Oral  SpO2: 96%  Weight: (!) 363 lb (  164.7 kg)  Height: 5\' 9"  (1.753 m)   Body mass index is 53.61 kg/m.  GEN: A/Ox3; pleasant , NAD, morbidly obese    HEENT:  [place]/AT,  EACs-clear, TMs-wnl, NOSE-clear, THROAT-clear, no lesions, no postnasal drip or exudate noted. Class 2-3 MP airway   NECK:  Supple w/ fair ROM; no JVD; normal carotid impulses w/o bruits; no thyromegaly or nodules palpated; no lymphadenopathy.    RESP  Clear  P & A; w/o, wheezes/ rales/ or rhonchi. no accessory muscle use, no dullness to percussion  CARD:  RRR, no m/r/g  , tr  peripheral edema, pulses intact, no cyanosis or clubbing.  GI:   Soft & nt; nml bowel sounds; no organomegaly or masses detected.   Musco: Warm bil, no deformities or joint swelling noted.   Neuro: alert, no focal deficits noted.    Skin: Warm, no lesions or rashes  Ayako Zinnia Tindall NP-C  [facility]  01/25/2016       Assessment & Plan:

## 2016-01-25 NOTE — Assessment & Plan Note (Signed)
Well-controlled on C Pap  Plan  Continue on C Pap at bedtime Continue to work on weight loss

## 2016-01-29 NOTE — Progress Notes (Signed)
ATC pt. Received message that phone was no longer in service. WCB

## 2016-01-30 ENCOUNTER — Ambulatory Visit: Payer: 59 | Admitting: Family Medicine

## 2016-01-30 NOTE — Progress Notes (Signed)
lmtcb x1 

## 2016-01-30 NOTE — Progress Notes (Signed)
Pt returned call Reviewed results and recs.  Pt voiced understanding and had no further questions. 

## 2016-02-08 ENCOUNTER — Encounter: Payer: Self-pay | Admitting: Family Medicine

## 2016-02-08 ENCOUNTER — Ambulatory Visit (HOSPITAL_BASED_OUTPATIENT_CLINIC_OR_DEPARTMENT_OTHER)
Admission: RE | Admit: 2016-02-08 | Discharge: 2016-02-08 | Disposition: A | Discharge status: Home / Self Care | Payer: 59 | Source: Ambulatory Visit | Attending: Family Medicine | Admitting: Family Medicine

## 2016-02-08 ENCOUNTER — Ambulatory Visit (INDEPENDENT_AMBULATORY_CARE_PROVIDER_SITE_OTHER): Payer: 59 | Admitting: Family Medicine

## 2016-02-08 VITALS — BP 127/82 | HR 73 | Temp 98.8°F | Resp 16 | Ht 69.0 in | Wt 365.0 lb

## 2016-02-08 DIAGNOSIS — M7989 Other specified soft tissue disorders: Secondary | ICD-10-CM | POA: Diagnosis not present

## 2016-02-08 DIAGNOSIS — M79605 Pain in left leg: Secondary | ICD-10-CM | POA: Insufficient documentation

## 2016-02-08 DIAGNOSIS — R600 Localized edema: Secondary | ICD-10-CM | POA: Insufficient documentation

## 2016-02-08 DIAGNOSIS — Z23 Encounter for immunization: Secondary | ICD-10-CM

## 2016-02-08 DIAGNOSIS — M79662 Pain in left lower leg: Secondary | ICD-10-CM

## 2016-02-08 NOTE — Progress Notes (Signed)
Pre visit review using our clinic review tool, if applicable. No additional management support is needed unless otherwise documented below in the visit note. 

## 2016-02-08 NOTE — Patient Instructions (Signed)
We'll call you with your ultrasound appt Elevate your leg and alternate ice/heat for pain relief Call with any questions or concerns Hang in there!!!

## 2016-02-08 NOTE — Progress Notes (Signed)
   Subjective:    Patient ID: [name], female    DOB: 30-Sep-1970, 45 y.o.   MRN: [id number]  HPI Knee pain- pt fell in March on L knee.  [facility] and was told that it would 'eventually get better'.  Pt reports there is a 'knot right below the knee' that is painful w/ pressure.  Pt has a band of numbness across the knot and extending around the front of the lower leg.  On ASA 162 daily.   Review of Systems For ROS see HPI     Objective:   Physical Exam  Constitutional: She is oriented to person, place, and time. She appears well-developed and well-nourished. No distress.  obese  Cardiovascular: Intact distal pulses.   Musculoskeletal: Normal range of motion. She exhibits edema (palpable fluid collection on L lower leg just below knee, very TTP) and tenderness.  Neurological: She is alert and oriented to person, place, and time.  Skin: Skin is warm and dry.  2" diameter discoloration/bruising of L lower leg just distal to knee  Psychiatric: She has a normal mood and affect. Her behavior is normal. Thought content normal.  Vitals reviewed.         Assessment & Plan:  L lower leg swelling- new.  Concern for persistent hematoma vs thrombus.  Get US to assess.  Reviewed supportive care and red flags that should prompt return.  Pt expressed understanding and is in agreement w/ plan.

## 2016-02-09 NOTE — Progress Notes (Signed)
Reviewed & agree with plan  

## 2016-02-11 MED FILL — TEGRETOL XR 200 MG TABLET: 200 | 30 days supply | Qty: 90 | Fill #2

## 2016-02-11 MED FILL — OMEPRAZOLE DR 40 MG CAPSULE: 40 | 30 days supply | Qty: 60 | Fill #1

## 2016-02-11 MED FILL — MELOXICAM 15 MG TABLET: 15 | 30 days supply | Qty: 30 | Fill #2

## 2016-02-11 MED FILL — GABAPENTIN 400 MG CAPSULE: 400 | 30 days supply | Qty: 90 | Fill #2

## 2016-02-11 MED FILL — lamoTRIgine 200 MG TABS: 200 | 30 days supply | Qty: 60 | Fill #2

## 2016-02-12 ENCOUNTER — Other Ambulatory Visit: Payer: Self-pay | Admitting: Neurology

## 2016-02-12 MED FILL — LANTUS SOLOSTAR 100 UNITS/M: 100 | 48 days supply | Qty: 30 | Fill #1

## 2016-02-21 ENCOUNTER — Ambulatory Visit (INDEPENDENT_AMBULATORY_CARE_PROVIDER_SITE_OTHER): Payer: 59 | Admitting: Internal Medicine

## 2016-02-21 ENCOUNTER — Encounter: Payer: Self-pay | Admitting: Internal Medicine

## 2016-02-21 VITALS — BP 132/84 | HR 73 | Ht 69.0 in | Wt 359.0 lb

## 2016-02-21 DIAGNOSIS — E1165 Type 2 diabetes mellitus with hyperglycemia: Secondary | ICD-10-CM

## 2016-02-21 DIAGNOSIS — Z794 Long term (current) use of insulin: Secondary | ICD-10-CM | POA: Diagnosis not present

## 2016-02-21 LAB — POCT GLYCOSYLATED HEMOGLOBIN (HGB A1C): HEMOGLOBIN A1C: 9.6

## 2016-02-21 NOTE — Progress Notes (Signed)
Patient ID: [name], female   DOB: 02/20/1971, 45 y.o.   MRN: [id number]  HPI: [name] is a 45 y.o.-year-old female, returning for f/u for DM2, dx in 2001, insulin-dependent 2007, uncontrolled, without complications. Last visit 4.5 mo ago.  She had a torn meniscus in L knee after a fall 07/2015 and also has shoulder pain >> needs surgery >> but HbA1c <9%.  She was on Weight Watchers diet - did not follow through as she could not go to the meetings after she fell in 07/2015. She restarted 1 mo ago >> lost 6 lbs  - diet and exercise >> sugars better!  She would like to have GBP >> had the first class. Will see Dr. Johna SheriffHoxworth.  Last hemoglobin A1c was: Lab Results  Component Value Date   HGBA1C 9.2 10/05/2015   HGBA1C 10.4 05/31/2015   HGBA1C 8.8 01/09/2015   Pt is on a regimen of: - Metformin 2000 mg at dinnertime - Lantus 63 units at bedtime - Victoza 1.8 mg daily - Amaryl 4 mg 2x a day She had frequent yeast inf when sugars were higher before.   Pt checks sugars 1-3x a day more recently >> significant improvement in last 4 weeks: - am: 120-130 >> 147 >> 119-160 - 2h after b'fast: n/c >> 145-176 >> n/c - before lunch: n/c >> 82-167 >> 120s - 2h after lunch: n/c >> 106-166 >> n/c - before dinner: n/c >> 86-110 >> n/c - 2h after dinner: n/c >> 145 >> 150-190 - bedtime: 107, 125-180 when having a good dinner; if goes out: 250-260 >> n/c  - nighttime: n/c No lows. Lowest sugar was 89 >> 99 >> 262 >> since restarted WW: ; she has hypoglycemia awareness at 105.  Highest sugar was 400-500 >> 300s >> 200s >> 227  Glucometer: One Touch Ultra 2    - no CKD, last BUN/creatinine:  Lab Results  Component Value Date   BUN 19 08/16/2015   CREATININE 0.62 08/16/2015  On Lisinopril. - last set of lipids: Lab Results  Component Value Date   CHOL 200 07/27/2015   HDL 55.70 07/27/2015   LDLCALC 110 (H) 07/27/2015   LDLDIRECT 112.0 06/21/2014   TRIG 168.0 (H) 07/27/2015   CHOLHDL 4  07/27/2015  On Atorvastatin.  - last eye exam was 08/2015. No DR. Dr Emily FilbertGould.  - no numbness and tingling in her feet. She had slight sxs 4-5 mo ago.  On ASA 81.   ROS: Constitutional: no weight gain/loss, no fatigue, no subjective hyperthermia/hypothermia Eyes: + blurry vision, no xerophthalmia ENT: no sore throat, no nodules palpated in throat, no dysphagia/odynophagia, no hoarseness Cardiovascular: no CP/palpitations/leg swelling Respiratory: no cough/+ SOB Gastrointestinal: no N/V/D/C Musculoskeletal: no muscle/+ joint aches Skin: no rashes Neurological: no tremors/numbness/tingling/dizziness  I reviewed pt's medications, allergies, PMH, social hx, family hx, and changes were documented in the history of present illness. Otherwise, unchanged from my initial visit note.  Past Medical History:  Diagnosis Date  . Allergy   . Asthma    pt stated treated as ashtma but not really asthma  . Bipolar disorder ([facility])   . Chicken pox   . Depression   . Diabetes mellitus   . GERD (gastroesophageal reflux disease)   . Hyperlipidemia   . Hypertension    readings  . Irregular heartbeat   . Migraine   . Sleep apnea    Past Surgical History:  Procedure Laterality Date  . ANKLE SURGERY  1989   left  .  BACK SURGERY  1999  . CESAREAN SECTION     1995  . LAPAROSCOPIC ASSISTED VAGINAL HYSTERECTOMY  2009     BSO fibroids, DUB, pelvic pain  . OOPHORECTOMY Bilateral 2009   cyst  . THYROIDECTOMY, PARTIAL  2001   removed left  . UMBILICAL HERNIA REPAIR  2003   Social History   Social History  . Marital Status: Married    Spouse Name: N/A  . Number of Children: 1   Occupational History  . Registrar, Administrator   Social History Main Topics  . Smoking status: Never Smoker   . Smokeless tobacco: Never Used  . Alcohol Use: No  . Drug Use: No   Current Outpatient Prescriptions on File Prior to Visit  Medication Sig Dispense Refill  . ARIPiprazole (ABILIFY) 5 MG tablet  Take 1 tablet by mouth daily.  2  . aspirin 81 MG tablet Take 160 mg by mouth daily.    [name] atorvastatin (LIPITOR) 40 MG tablet TAKE 1 TABLET BY MOUTH DAILY 90 tablet 1  . Calcium Carbonate-Vitamin D (CALTRATE 600+D PO) Take 1 tablet by mouth 2 (two) times daily.    . carbamazepine (TEGRETOL XR) 200 MG 12 hr tablet Take 200 mg by mouth. 1 tablet in the morning, 2 at night    . carvedilol (COREG) 25 MG tablet TAKE 1 TABLET BY MOUTH 2 TIMES DAILY. 180 tablet 1  . cholecalciferol (VITAMIN D) 1000 UNITS tablet Take 2,000 Units by mouth daily.    . clobetasol ointment (TEMOVATE) 0.05 % Apply 1 application topically 2 (two) times daily. Do not use for more than 7 days 60 g 1  . cyclobenzaprine (FLEXERIL) 10 MG tablet Take 1 tablet (10 mg total) by mouth 3 (three) times daily as needed for muscle spasms. 30 tablet 0  . estradiol (ESTRACE) 1 MG tablet TAKE 1 TABLET BY MOUTH TWICE A DAY 180 tablet 4  . fenofibrate 160 MG tablet TAKE 1 TABLET BY MOUTH DAILY 90 tablet 1  . fluticasone (FLONASE) 50 MCG/ACT nasal spray Place 2 sprays into both nostrils daily. 16 g 2  . gabapentin (NEURONTIN) 400 MG capsule Take 400 mg by mouth 3 (three) times daily.    [name] glimepiride (AMARYL) 4 MG tablet TAKE 1 TABLET (4 MG TOTAL) BY MOUTH 2 TIMES DAILY. 60 tablet 5  . hydrochlorothiazide (HYDRODIURIL) 25 MG tablet TAKE 1 TABLET (25 MG TOTAL) BY MOUTH DAILY. 90 tablet 1  . HYDROcodone-acetaminophen (NORCO) 10-325 MG tablet Take 1 tablet by mouth 2 (two) times daily.    . Insulin Pen Needle (B-D UF III MINI PEN NEEDLES) 31G X 5 MM MISC Use to inject insulin 2 times daily 180 each 3  . lamoTRIgine (LAMICTAL) 200 MG tablet Take 200 mg by mouth 2 (two) times daily.    . Lancets 30G MISC Use one lancet each time sugars are checked. Pt tests twice daily. DX E11.9 100 each 2  . LANTUS SOLOSTAR 100 UNIT/ML Solostar Pen INJECT 63 UNITS INTO THE SKIN DAILY AT 10 PM. 30 mL 3  . lisinopril (PRINIVIL,ZESTRIL) 20 MG tablet TAKE 1 TABLET (20  MG TOTAL) BY MOUTH DAILY. 90 tablet 1  . meloxicam (MOBIC) 15 MG tablet   2  . metFORMIN (GLUCOPHAGE) 1000 MG tablet TAKE 1 TABLET (1,000 MG TOTAL) BY MOUTH 2 TIMES DAILY WITH MEAL 60 tablet 2  . Multiple Vitamins-Minerals (CENTRUM ULTRA WOMENS PO) Take by mouth.    [name] omeprazole (PRILOSEC) 40 MG capsule TAKE 1 CAPSULE  BY MOUTH TWICE A DAY 60 capsule 5  . ONE TOUCH ULTRA TEST test strip TEST TWICE DAILY 100 each 2  . QVAR 80 MCG/ACT inhaler INHALE 2 PUFFS BY MOUTH INTO THE LUNGS 2 (TWO) TIMES DAILY. 8.7 g 6  . ranitidine (ZANTAC) 300 MG tablet Take 1 tablet (300 mg total) by mouth at bedtime. 90 tablet 3  . SUMAtriptan (IMITREX) 100 MG tablet Take 1tab at earliest onset of headache.  May repeat x1 in 2 hours if headache persists or recurs. 9 tablet 0  . tiaGABine (GABITRIL) 4 MG tablet Take 8 mg by mouth 2 (two) times daily.    . VENTOLIN HFA 108 (90 Base) MCG/ACT inhaler INHALE 2 PUFFS BY MOUTH INTO THE LUNGS EVERY 4 HOURS AS NEEDED FOR WHEEZING OR SHORTNESS OF BREATH 18 g 3  . VICTOZA 18 MG/3ML SOPN INJECT 0.3 MLS (1.8 MG TOTAL) INTO THE SKIN DAILY. 9 mL 1   No current facility-administered medications on file prior to visit.    No Known Allergies Family History  Problem Relation Age of Onset  . Hyperlipidemia Mother   . Diabetes Mother   . Anxiety disorder Mother   . Heart disease Mother   . Hypertension Father   . Lupus Father   . Heart disease Father   . Stroke Maternal Aunt   . Stroke Paternal Grandfather   . Mental illness Paternal Grandfather    PE: BP 132/84   Pulse 73   Ht 5\' 9"  (1.753 m)   Wt (!) 359 lb (162.8 kg)   LMP 01/25/2008   BMI 53.02 kg/m  Wt Readings from Last 3 Encounters:  02/21/16 (!) 359 lb (162.8 kg)  02/08/16 (!) 365 lb (165.6 kg)  01/25/16 (!) 363 lb (164.7 kg)   Constitutional: obese in NAD Eyes: PERRLA, EOMI, no exophthalmos ENT: moist mucous membranes, no thyromegaly, no cervical lymphadenopathy Cardiovascular: RRR, No MRG Respiratory: CTA  B Gastrointestinal: abdomen soft, NT, ND, BS+ Musculoskeletal: no deformities, strength intact in all 4 Skin: moist, warm, no rashes Neurological: no tremor with outstretched hands, DTR normal in all 4  ASSESSMENT: 1. DM2, insulin-dependent, uncontrolled, without complications  PLAN:  1. Patient with long-standing, uncontrolled diabetes, on oral antidiabetic regimen + basal insulin, with now much better control of her sugars after recently changing her diet. Sugars in am are still high but she is determined to continue with the dietary changes and will also get GBP >> will not change regimen. At next visit, we may need to add Invokana if sugars not better. - again discuss about healthier meals  - I advised her to:  Patient Instructions  Please continue: - Metformin all at dinnertime (2000 mg) - Lantus 63 units at bedtime - Amaryl 4 mg 2x a day - Victoza 1.8 mg daily   Continue to improve your diet.   Continue to check sugars 2x a day, rotating check times.  Please return in 1.5 months with your sugar log.   - check sugars at different times of the day - check 2 times a day, rotating checks - advised for yearly eye exams >> she is UTD - check HbA1c today >> 9.6% (higher) - Return to clinic in 1.5 mo with sugar log   Carlus Pavlov, MD PhD [facility]

## 2016-02-21 NOTE — Addendum Note (Signed)
Addended by: Bobbye RiggsWALKER, LISA L on: 02/21/2016 03:08 PM   Modules accepted: Orders

## 2016-02-21 NOTE — Patient Instructions (Addendum)
Please continue: - Metformin all at dinnertime (2000 mg) - Lantus 63 units at bedtime - Amaryl 4 mg 2x a day - Victoza 1.8 mg daily   Continue to improve your diet.   Continue to check sugars 2x a day, rotating check times.  Please return in 1.5 months with your sugar log.

## 2016-02-25 ENCOUNTER — Other Ambulatory Visit (HOSPITAL_COMMUNITY): Payer: Self-pay | Admitting: General Surgery

## 2016-02-26 ENCOUNTER — Encounter: Payer: Self-pay | Admitting: Internal Medicine

## 2016-02-26 MED FILL — GLIMEPIRIDE 4 MG TABLET: 4 | 30 days supply | Qty: 60 | Fill #5

## 2016-02-26 MED FILL — QVAR 80 MCG ORAL INHALER: 80 | 30 days supply | Qty: 9 | Fill #2

## 2016-02-26 MED FILL — ARIPiprazole 5 MG TABS: 5 | 30 days supply | Qty: 30 | Fill #2

## 2016-02-26 MED FILL — metFORMIN HCL 1000 MG TABS: 1000 | 30 days supply | Qty: 60 | Fill #1

## 2016-02-28 ENCOUNTER — Other Ambulatory Visit: Payer: Self-pay | Admitting: Family Medicine

## 2016-02-28 ENCOUNTER — Encounter: Payer: 59 | Attending: General Surgery | Admitting: Dietician

## 2016-02-28 DIAGNOSIS — Z713 Dietary counseling and surveillance: Secondary | ICD-10-CM | POA: Insufficient documentation

## 2016-02-28 MED FILL — ONE TOUCH ULTRA TEST STRIPS: 50 days supply | Qty: 100 | Fill #0

## 2016-02-28 NOTE — Patient Instructions (Signed)
Follow Pre-Op Goals Try Protein Shakes Call [facility] at [phone] when surgery is scheduled to enroll in Pre-Op Class  Things to remember:  Please always be honest with us. We want to support you!  If you have any questions or concerns in between appointments, please call or email Liz, Leslie, or Laurie.  The diet after surgery will be high protein and low in carbohydrate.  Vitamins and calcium need to be taken for the rest of your life.  Feel free to include support people in any classes or appointments.   Supplement recommendations:  Before Surgery   1 Complete Multivitamin with Iron  3000 IU Vitamin D3  After Surgery   2 Chewable Multivitamins  **Best Choice - Bariatric Advantage Advanced Multi EA      3 Chewable Calcium (500 mg each, total 1200-1500 mg per day)  **Best Choice - Celebrate, Bariatric Advantage, or Wellesse  Other Options:    2 Flinstones Complete + up to 100 mg Thiamin + 2000-3000 IU Vitamin D3 + 350-500 mcg Vitamin B12 + 30-45 mg Iron (with history of deficiency)  2 Celebrate MultiComplete with 18 mg Iron (this provides 6000 IU of  Vitamin D3)  4 Celebrate Essential Multi 2 in 1 (has calcium) + 18-60 mg separate  iron  Vitamins and Calcium are available at:   [facility]   [address], [place], [place] [address]   [email or web address]  [email or web address]  [email or web address]   

## 2016-02-28 NOTE — Progress Notes (Signed)
  Pre-Op Assessment Visit:  Pre-Operative RYGB Surgery  Medical Nutrition Therapy:  Appt start time: 1620   End time:  1710.  Patient was seen on 02/28/2016 for Pre-Operative Nutrition Assessment. [facility] on 02/28/2016.   Preferred Learning Style:   No preference indicated   Learning Readiness:   Ready  Handouts given during visit include:  Pre-Op Goals Bariatric Surgery Protein Shakes   During the appointment today the following Pre-Op Goals were reviewed with the patient: Maintain or lose weight as instructed by your surgeon Make healthy food choices Begin to limit portion sizes Limited concentrated sugars and fried foods Keep fat/sugar in the single digits per serving on   food labels Practice CHEWING your food  (aim for 30 chews per bite or until applesauce consistency) Practice not drinking 15 minutes before, during, and 30 minutes after each meal/snack Avoid all carbonated beverages  Avoid/limit caffeinated beverages  Avoid all sugar-sweetened beverages Consume 3 meals per day; eat every 3-5 hours Make a list of non-food related activities Aim for 64-100 ounces of FLUID daily  Aim for at least 60-80 grams of PROTEIN daily Look for a liquid protein source that contain ?15 g protein and ?5 g carbohydrate  (ex: shakes, drinks, shots)  Patient-Centered Goals: Would like to get healthier, be able to do things without considering her size, enjoy exercise, decrease pain, ride rollercoaster   10 confidence/10 importance scale 1-10  Demonstrated degree of understanding via:  Teach Back  Teaching Method Utilized:  Visual Auditory Hands on  Barriers to learning/adherence to lifestyle change: none  Patient to call the [facility] to enroll in Pre-Op and Post-Op Nutrition Education when surgery date is scheduled.

## 2016-02-29 ENCOUNTER — Other Ambulatory Visit (HOSPITAL_COMMUNITY): Payer: Self-pay | Admitting: General Surgery

## 2016-02-29 ENCOUNTER — Ambulatory Visit (HOSPITAL_COMMUNITY)
Admission: RE | Admit: 2016-02-29 | Discharge: 2016-02-29 | Disposition: A | Discharge status: Home / Self Care | Payer: 59 | Source: Ambulatory Visit | Attending: General Surgery | Admitting: General Surgery

## 2016-02-29 ENCOUNTER — Ambulatory Visit (INDEPENDENT_AMBULATORY_CARE_PROVIDER_SITE_OTHER): Payer: 59 | Admitting: Psychology

## 2016-02-29 ENCOUNTER — Other Ambulatory Visit: Payer: Self-pay

## 2016-02-29 DIAGNOSIS — E669 Obesity, unspecified: Secondary | ICD-10-CM

## 2016-02-29 DIAGNOSIS — Z6841 Body Mass Index (BMI) 40.0 and over, adult: Secondary | ICD-10-CM | POA: Diagnosis not present

## 2016-02-29 DIAGNOSIS — F3176 Bipolar disorder, in full remission, most recent episode depressed: Secondary | ICD-10-CM

## 2016-02-29 DIAGNOSIS — Z01818 Encounter for other preprocedural examination: Secondary | ICD-10-CM | POA: Diagnosis not present

## 2016-03-11 ENCOUNTER — Other Ambulatory Visit: Payer: Self-pay | Admitting: Family Medicine

## 2016-03-11 MED FILL — MELOXICAM 15 MG TABLET: 15 | 30 days supply | Qty: 30 | Fill #0

## 2016-03-11 MED FILL — HYDROCHLOROTHIAZIDE 25 MG T: 25 | 90 days supply | Qty: 90 | Fill #1

## 2016-03-11 MED FILL — CARVEDILOL 25 MG TABLET: 25 | 90 days supply | Qty: 180 | Fill #0

## 2016-03-11 MED FILL — TEGRETOL XR 200 MG TABLET: 200 | 30 days supply | Qty: 90 | Fill #3

## 2016-03-11 MED FILL — tiaGABine HCL 4 MG TABS: 4 | 90 days supply | Qty: 360 | Fill #1

## 2016-03-11 MED FILL — FENOFIBRATE 160 MG TABLET: 160 | 90 days supply | Qty: 90 | Fill #0

## 2016-03-17 ENCOUNTER — Ambulatory Visit (INDEPENDENT_AMBULATORY_CARE_PROVIDER_SITE_OTHER): Payer: 59 | Admitting: Psychology

## 2016-03-17 DIAGNOSIS — F3176 Bipolar disorder, in full remission, most recent episode depressed: Secondary | ICD-10-CM | POA: Diagnosis not present

## 2016-03-21 MED FILL — lamoTRIgine 200 MG TABS: 200 | 30 days supply | Qty: 60 | Fill #0

## 2016-03-22 ENCOUNTER — Telehealth: Payer: 59 | Admitting: Nurse Practitioner

## 2016-03-22 DIAGNOSIS — H578 Other specified disorders of eye and adnexa: Secondary | ICD-10-CM | POA: Diagnosis not present

## 2016-03-22 DIAGNOSIS — H5789 Other specified disorders of eye and adnexa: Secondary | ICD-10-CM

## 2016-03-22 MED ORDER — POLYMYXIN B-TRIMETHOPRIM 10000-0.1 UNIT/ML-% OP SOLN: 2.0000 [drp] | OPHTHALMIC | 0 refills | Status: DC

## 2016-03-22 NOTE — Progress Notes (Signed)

## 2016-03-26 ENCOUNTER — Other Ambulatory Visit: Payer: Self-pay | Admitting: Internal Medicine

## 2016-03-26 MED FILL — metFORMIN HCL 1000 MG TABS: 1000 | 30 days supply | Qty: 60 | Fill #2

## 2016-03-26 MED FILL — GABAPENTIN 400 MG CAPSULE: 400 | 30 days supply | Qty: 90 | Fill #0

## 2016-03-26 MED FILL — QVAR 80 MCG ORAL INHALER: 80 | 30 days supply | Qty: 9 | Fill #3

## 2016-03-26 MED FILL — LANTUS SOLOSTAR 100 UNITS/M: 100 | 48 days supply | Qty: 30 | Fill #2

## 2016-03-26 MED FILL — ARIPiprazole 5 MG TABS: 5 | 30 days supply | Qty: 30 | Fill #3

## 2016-03-26 NOTE — Progress Notes (Signed)
Pt is being scheduled for preop appt; please place surgical orders in epic. Thanks.  

## 2016-03-27 ENCOUNTER — Other Ambulatory Visit: Payer: Self-pay | Admitting: Pulmonary Disease

## 2016-03-27 DIAGNOSIS — R0600 Dyspnea, unspecified: Secondary | ICD-10-CM

## 2016-03-27 MED FILL — GLIMEPIRIDE 4 MG TABLET: 4 | 30 days supply | Qty: 60 | Fill #0

## 2016-03-27 MED FILL — BD PEN NDL MINI 31GX5MM: 31G X 5 MM | 50 days supply | Qty: 100 | Fill #0

## 2016-03-28 ENCOUNTER — Ambulatory Visit (INDEPENDENT_AMBULATORY_CARE_PROVIDER_SITE_OTHER): Payer: 59 | Admitting: Pulmonary Disease

## 2016-03-28 ENCOUNTER — Encounter: Payer: Self-pay | Admitting: Pulmonary Disease

## 2016-03-28 DIAGNOSIS — G4730 Sleep apnea, unspecified: Secondary | ICD-10-CM

## 2016-03-28 DIAGNOSIS — R0600 Dyspnea, unspecified: Secondary | ICD-10-CM | POA: Diagnosis not present

## 2016-03-28 DIAGNOSIS — G4710 Hypersomnia, unspecified: Secondary | ICD-10-CM | POA: Diagnosis not present

## 2016-03-28 DIAGNOSIS — G4733 Obstructive sleep apnea (adult) (pediatric): Secondary | ICD-10-CM | POA: Diagnosis not present

## 2016-03-28 LAB — PULMONARY FUNCTION TEST
DL/VA % pred: 90 %
DL/VA: 4.86 ml/min/mmHg/L
DLCO UNC % PRED: 76 %
DLCO UNC: 23.75 ml/min/mmHg
DLCO cor % pred: 73 %
DLCO cor: 22.7 ml/min/mmHg
FEF 25-75 PRE: 2.86 L/s
FEF 25-75 Post: 3.57 L/sec
FEF2575-%Change-Post: 24 %
FEF2575-%Pred-Post: 109 %
FEF2575-%Pred-Pre: 87 %
FEV1-%CHANGE-POST: 3 %
FEV1-%PRED-POST: 86 %
FEV1-%PRED-PRE: 83 %
FEV1-POST: 2.95 L
FEV1-PRE: 2.85 L
FEV1FVC-%Change-Post: 2 %
FEV1FVC-%PRED-PRE: 102 %
FEV6-%Change-Post: 0 %
FEV6-%PRED-POST: 82 %
FEV6-%PRED-PRE: 81 %
FEV6-POST: 3.46 L
FEV6-Pre: 3.43 L
FEV6FVC-%PRED-POST: 102 %
FEV6FVC-%PRED-PRE: 102 %
FVC-%CHANGE-POST: 0 %
FVC-%PRED-PRE: 80 %
FVC-%Pred-Post: 80 %
FVC-POST: 3.46 L
FVC-PRE: 3.43 L
POST FEV6/FVC RATIO: 100 %
PRE FEV6/FVC RATIO: 100 %
Post FEV1/FVC ratio: 85 %
Pre FEV1/FVC ratio: 83 %
RV % PRED: 82 %
RV: 1.59 L
TLC % PRED: 87 %
TLC: 5.06 L

## 2016-03-28 NOTE — Progress Notes (Signed)
   Subjective:    Patient ID: [name], female    DOB: 06/05/1970, 45 y.o.   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 She has been diagnosed with asthma and is maintained on Qvar and albuterol- prior attempt to discontinue Qvar caused increased dyspnea and wheezing.   03/28/2016    She reports recurrent daytime fatigue and excessive somnolence in spite of good compliance with her CPAP  She likes her new machine Download shows good compliance and good control of events  Uses nasal mask - has never used nasal pillows Takes lantus, lamictal, gabapentin, coreg at night    Gastric bypass surgery is planned in a few weeks by Dr. Johna SheriffHoxworth   Significant tests/ events PSG 2008 showed moderate OSA with AHI of 24 per hour, corrected by C Pap of 11 cm with a small nasal mask.  PFTs 03/2016 showed no evidence of airway obstruction, lung volumes normal, DLCO 76% and corrects for alveolar volume-consistent with obesity  Review of Systems Patient denies significant dyspnea,cough, hemoptysis,  chest pain, palpitations, pedal edema, orthopnea, paroxysmal nocturnal dyspnea, lightheadedness, nausea, vomiting, abdominal or  leg pains      Objective:   Physical Exam  Gen. Pleasant, obese, in no distress ENT - no lesions, no post nasal drip Neck: No JVD, no thyromegaly, no carotid bruits Lungs: no use of accessory muscles, no dullness to percussion, decreased without rales or rhonchi  Cardiovascular: Rhythm regular, heart sounds  normal, no murmurs or gallops, no peripheral edema Musculoskeletal: No deformities, no cyanosis or clubbing , no tremors       Assessment & Plan:

## 2016-03-28 NOTE — Assessment & Plan Note (Signed)
Residual sleepiness but be related to medications-especially Neurontin and Lamictal  She is not a good candidate for modafinil

## 2016-03-28 NOTE — Assessment & Plan Note (Signed)
Lung function is good-shortness of breath maybe related to deconditioning  Your CPAP is set at 13 cm-take it with you during your bypass surgery  Weight loss encouraged, compliance with goal of at least 4-6 hrs every night is the expectation. Advised against medications with sedative side effects Cautioned against driving when sleepy - understanding that sleepiness will vary on a day to day basis   If she loses enough weight may consider repeating sleep study next year

## 2016-03-28 NOTE — Patient Instructions (Signed)
Lung function is good-shortness of breath maybe related to deconditioning  Your CPAP is set at 13 cm-take it with you during your bypass surgery

## 2016-03-31 ENCOUNTER — Ambulatory Visit: Payer: Self-pay | Admitting: General Surgery

## 2016-03-31 ENCOUNTER — Encounter: Payer: 59 | Attending: General Surgery | Admitting: Dietician

## 2016-03-31 ENCOUNTER — Encounter: Payer: Self-pay | Admitting: Dietician

## 2016-03-31 DIAGNOSIS — Z713 Dietary counseling and surveillance: Secondary | ICD-10-CM | POA: Insufficient documentation

## 2016-03-31 DIAGNOSIS — F3162 Bipolar disorder, current episode mixed, moderate: Secondary | ICD-10-CM | POA: Diagnosis not present

## 2016-03-31 NOTE — Progress Notes (Signed)
  Pre-Operative Nutrition Class:  Appt start time: 3790   End time:  1830.  Patient was seen on 03/31/2016 for Pre-Operative Bariatric Surgery Education at [facility].   Surgery date: 04/14/2016 Surgery type: RYGB Start weight at [facility]: 358 lbs on 02/28/2016 Weight today: 358.8 lbs  TANITA  BODY COMP RESULTS  03/31/16   BMI (kg/m^2) 53   Fat Mass (lbs) 192.2   Fat Free Mass (lbs) 166.2   Total Body Water (lbs) 125.4   Samples given per MNT protocol. Patient educated on appropriate usage: Premier protein shake (chocolate - qty 1) Lot #: [id number] Exp: 01/2017  The following the learning objectives were met by the patient during this course:  Identify Pre-Op Dietary Goals and will begin 2 weeks pre-operatively  Identify appropriate sources of fluids and proteins   State protein recommendations and appropriate sources pre and post-operatively  Identify Post-Operative Dietary Goals and will follow for 2 weeks post-operatively  Identify appropriate multivitamin and calcium sources  Describe the need for physical activity post-operatively and will follow MD recommendations  State when to call healthcare provider regarding medication questions or post-operative complications  Handouts given during class include:  Pre-Op Bariatric Surgery Diet Handout  Protein Shake Handout  Post-Op Bariatric Surgery Nutrition Handout  BELT Program Information Flyer  Support Group Information Flyer  [facility] Bariatric Supplements Price List  Follow-Up Plan: Patient will follow-up at [facility] 2 weeks post operatively for diet advancement per MD.

## 2016-04-01 ENCOUNTER — Encounter: Payer: Self-pay | Admitting: Dietician

## 2016-04-03 ENCOUNTER — Ambulatory Visit: Payer: Self-pay | Admitting: General Surgery

## 2016-04-03 ENCOUNTER — Encounter: Payer: Self-pay | Admitting: Internal Medicine

## 2016-04-03 DIAGNOSIS — G4733 Obstructive sleep apnea (adult) (pediatric): Secondary | ICD-10-CM | POA: Diagnosis not present

## 2016-04-03 NOTE — Patient Instructions (Addendum)
Tressy Panas  04/03/2016   Your procedure is scheduled on: 04/14/16  [facility]  Entrance take [place]  elevators to 3rd floor to  Short Stay Center at [phone] AM.  Call this number if you have problems the morning of surgery [phone] BRING CPAP MASK AND TUBING WITH YOU TO HOSPITAL                         DO NOT TAKE GLIMEPIRIDE THE EVENING BEFORE SURGERY  Remember: ONLY 1 PERSON MAY GO WITH YOU TO SHORT STAY TO GET  READY MORNING OF YOUR SURGERY.  Do not eat food or drink liquids :After Midnight.     Take these medicines the morning of surgery with A SIP OF WATER:  Abilify, Tegretol,XR, Carvidolol, Neurontin, Lamictal, Omeprazole  Use QVAR----- MAY USE FLONASE OR/ VENTOLIN IF NEEDED DO NOT TAKE ANY DIABETIC MEDICATIONS DAY OF YOUR SURGERY                               You may not have any metal on your body including hair pins and              piercings  Do not wear jewelry, make-up, lotions, powders or perfumes, deodorant             Do not wear nail polish.  Do not shave  48 hours prior to surgery.              Men may shave face and neck.   Do not bring valuables to the hospital. [facility] IS NOT             RESPONSIBLE   FOR VALUABLES.  Contacts, dentures or bridgework may not be worn into surgery.  Leave suitcase in the car. After surgery it may be brought to your room.                Please read over the following fact sheets you were given: _____________________________________________________________________             How to Manage Your Diabetes Before and After Surgery  Why is it important to control my blood sugar before and after surgery? . Improving blood sugar levels before and after surgery helps healing and can limit problems. . A way of improving blood sugar control is eating a healthy diet by: o  Eating less sugar and carbohydrates o  Increasing activity/exercise o  Talking with your doctor about reaching your blood  sugar goals . High blood sugars (greater than 180 mg/dL) can raise your risk of infections and slow your recovery, so you will need to focus on controlling your diabetes during the weeks before surgery. . Make sure that the doctor who takes care of your diabetes knows about your planned surgery including the date and location.  How do I manage my blood sugar before surgery? . Check your blood sugar at least 4 times a day, starting 2 days before surgery, to make sure that the level is not too high or low. o Check your blood sugar the morning of your surgery when you wake up and every 2 hours until you get to the Short Stay unit. . If your blood sugar is less than 70 mg/dL, you will need to treat for low blood sugar: o  Do not take insulin. o Treat a low blood sugar (less than 70 mg/dL) with  cup of clear juice (cranberry or apple), 4 glucose tablets, OR glucose gel. o Recheck blood sugar in 15 minutes after treatment (to make sure it is greater than 70 mg/dL). If your blood sugar is not greater than 70 mg/dL on recheck, call [phone] for further instructions. . Report your blood sugar to the short stay nurse when you get to Short Stay.  . If you are admitted to the hospital after surgery: o Your blood sugar will be checked by the staff and you will probably be given insulin after surgery (instead of oral diabetes medicines) to make sure you have good blood sugar levels. o The goal for blood sugar control after surgery is 80-180 mg/dL.   WHAT DO I DO ABOUT MY DIABETES MEDICATION?  [name]. Do not take oral diabetes medicines (pills) the morning of surgery.  . THE NIGHT BEFORE SURGERY, take ____31U_______ units of ___Lantus________insulin.       .   . The day of surgery, do not take other diabetes injectables, including Byetta (exenatide), Bydureon (exenatide ER), Victoza (liraglutide), or Trulicity (dulaglutide).  .    Patient Signature:  Date:   Nurse Signature:  Date:   Reviewed and  Endorsed by [facility] Patient Education [facility], August 2015[facility] - Preparing for Surgery Before surgery, you can play an important role.  Because skin is not sterile, your skin needs to be as free of germs as possible.  You can reduce the number of germs on your skin by washing with CHG (chlorahexidine gluconate) soap before surgery.  CHG is an antiseptic cleaner which kills germs and bonds with the skin to continue killing germs even after washing. Please DO NOT use if you have an allergy to CHG or antibacterial soaps.  If your skin becomes reddened/irritated stop using the CHG and inform your nurse when you arrive at Short Stay. Do not shave (including legs and underarms) for at least 48 hours prior to the first CHG shower.  You may shave your face/neck. Please follow these instructions carefully:  1.  Shower with CHG Soap the night before surgery and the  morning of Surgery.  2.  If you choose to wash your hair, wash your hair first as usual with your  normal  shampoo.  3.  After you shampoo, rinse your hair and body thoroughly to remove the  shampoo.                           4.  Use CHG as you would any other liquid soap.  You can apply chg directly  to the skin and wash                       Gently with a scrungie or clean washcloth.  5.  Apply the CHG Soap to your body ONLY FROM THE NECK DOWN.   Do not use on face/ open                           Wound or open sores. Avoid contact with eyes, ears mouth and genitals (private parts).                       Wash face,  Genitals (private parts) with your normal soap.  6.  Wash thoroughly, paying special attention to the area where your surgery  will be performed.  7.  Thoroughly rinse your body with warm water from the neck down.  8.  DO NOT shower/wash with your normal soap after using and rinsing off  the CHG Soap.                9.  Pat yourself dry with a clean towel.            10.  Wear clean pajamas.            11.   Place clean sheets on your bed the night of your first shower and do not  sleep with pets. Day of Surgery : Do not apply any lotions/deodorants the morning of surgery.  Please wear clean clothes to the hospital/surgery center.  FAILURE TO FOLLOW THESE INSTRUCTIONS MAY RESULT IN THE CANCELLATION OF YOUR SURGERY ____________________________

## 2016-04-04 ENCOUNTER — Encounter (HOSPITAL_COMMUNITY)
Admission: RE | Admit: 2016-04-04 | Discharge: 2016-04-04 | Disposition: A | Discharge status: Home / Self Care | Payer: 59 | Source: Ambulatory Visit | Attending: General Surgery | Admitting: General Surgery

## 2016-04-04 ENCOUNTER — Encounter (HOSPITAL_COMMUNITY): Payer: Self-pay

## 2016-04-04 DIAGNOSIS — Z01812 Encounter for preprocedural laboratory examination: Secondary | ICD-10-CM | POA: Insufficient documentation

## 2016-04-04 LAB — CBC WITH DIFFERENTIAL/PLATELET
Basophils Absolute: 0 10*3/uL (ref 0.0–0.1)
Basophils Relative: 0 %
EOS ABS: 0.3 10*3/uL (ref 0.0–0.7)
EOS PCT: 4 %
HCT: 39.6 % (ref 36.0–46.0)
Hemoglobin: 13.6 g/dL (ref 12.0–15.0)
LYMPHS ABS: 2.5 10*3/uL (ref 0.7–4.0)
LYMPHS PCT: 32 %
MCH: 29.1 pg (ref 26.0–34.0)
MCHC: 34.3 g/dL (ref 30.0–36.0)
MCV: 84.6 fL (ref 78.0–100.0)
MONO ABS: 0.7 10*3/uL (ref 0.1–1.0)
MONOS PCT: 9 %
Neutro Abs: 4.3 10*3/uL (ref 1.7–7.7)
Neutrophils Relative %: 55 %
PLATELETS: 289 10*3/uL (ref 150–400)
RBC: 4.68 MIL/uL (ref 3.87–5.11)
RDW: 12.7 % (ref 11.5–15.5)
WBC: 7.8 10*3/uL (ref 4.0–10.5)

## 2016-04-04 LAB — COMPREHENSIVE METABOLIC PANEL
ALT: 35 U/L (ref 14–54)
ANION GAP: 11 (ref 5–15)
AST: 42 U/L — ABNORMAL HIGH (ref 15–41)
Albumin: 4 g/dL (ref 3.5–5.0)
Alkaline Phosphatase: 53 U/L (ref 38–126)
BUN: 27 mg/dL — ABNORMAL HIGH (ref 6–20)
CALCIUM: 10.3 mg/dL (ref 8.9–10.3)
CHLORIDE: 98 mmol/L — AB (ref 101–111)
CO2: 27 mmol/L (ref 22–32)
CREATININE: 0.71 mg/dL (ref 0.44–1.00)
Glucose, Bld: 177 mg/dL — ABNORMAL HIGH (ref 65–99)
Potassium: 3.9 mmol/L (ref 3.5–5.1)
SODIUM: 136 mmol/L (ref 135–145)
Total Bilirubin: 0.6 mg/dL (ref 0.3–1.2)
Total Protein: 7.6 g/dL (ref 6.5–8.1)

## 2016-04-04 LAB — GLUCOSE, CAPILLARY: Glucose-Capillary: 185 mg/dL — ABNORMAL HIGH (ref 65–99)

## 2016-04-04 MED FILL — PROMETHAZINE 12.5 MG TABLET: 12.5 | 4 days supply | Qty: 15 | Fill #0

## 2016-04-04 MED FILL — ONDANSETRON ODT 4 MG TABLET: 4 | 4 days supply | Qty: 15 | Fill #0

## 2016-04-04 MED FILL — PANTOPRAZOLE SOD DR 40 MG T: 40 | 90 days supply | Qty: 90 | Fill #0

## 2016-04-04 MED FILL — NEOMYCIN 500 MG TABLET: 500 | 1 days supply | Qty: 6 | Fill #0

## 2016-04-04 MED FILL — metroNIDAZOLE 500 MG TABS: 500 | 1 days supply | Qty: 6 | Fill #0

## 2016-04-09 ENCOUNTER — Encounter: Payer: Self-pay | Admitting: Family Medicine

## 2016-04-09 ENCOUNTER — Ambulatory Visit (INDEPENDENT_AMBULATORY_CARE_PROVIDER_SITE_OTHER): Payer: 59 | Admitting: Family Medicine

## 2016-04-09 ENCOUNTER — Other Ambulatory Visit: Payer: Self-pay | Admitting: Family Medicine

## 2016-04-09 MED ORDER — HYDROCHLOROTHIAZIDE 12.5 MG PO TABS: 12.5000 mg | ORAL_TABLET | Freq: Every day | ORAL | 1 refills | Status: DC

## 2016-04-09 MED FILL — TEGRETOL XR 200 MG TABLET: 200 | 30 days supply | Qty: 90 | Fill #4

## 2016-04-09 MED FILL — oxyCODONE HCL 5 MG/5ML SOLN: 5 | 4 days supply | Qty: 200 | Fill #0

## 2016-04-09 MED FILL — GABAPENTIN 600 MG TABLET: 600 | 30 days supply | Qty: 60 | Fill #0

## 2016-04-09 MED FILL — HYDROCHLOROTHIAZIDE 12.5 MG: 12.5 | 90 days supply | Qty: 90 | Fill #0

## 2016-04-09 MED FILL — ATORVASTATIN 40 MG TABLET: 40 | 90 days supply | Qty: 90 | Fill #0

## 2016-04-09 NOTE — Assessment & Plan Note (Signed)
Ongoing issue.  Pt is having gastric bypass on Monday and was told to review medications for possible changes after surgery.  She has already met w/ Dr Cruzita Lederer and those med changes are outlined in the HPI.  In regards to the meds that she gets from me, the only change to be made at this time is to decrease the HCTZ from 46m to 12.535mdaily.  I suspect that as she loses weight, we will need to decrease her Lisinopril and Lipitor but that will not be an immediate change.  Pt expressed understanding and is in agreement w/ plan.

## 2016-04-09 NOTE — Progress Notes (Signed)
   Subjective:    Patient ID: [name], female    DOB: 03/30/1971, 45 y.o.   MRN: [id number]  HPI Pt has bariatric surgery planned for Monday (gastric bypass).  She was told that she needs to know what medications need to be reduced or adjusted after surgery.  She has already met w/ Dr Cruzita Lederer who is d/c'ing Victoza, Glimepiride, and she will continue the Metformin and reduce Lantus by 50%.  Pt is very excited to have surgery on Monday as she is looking forward to the weight loss.   Review of Systems For ROS see HPI     Objective:   Physical Exam  Constitutional: She is oriented to person, place, and time. She appears well-developed and well-nourished. No distress.  obese  HENT:  Head: Normocephalic and atraumatic.  Eyes: Conjunctivae and EOM are normal. Pupils are equal, round, and reactive to light.  Neck: Normal range of motion. Neck supple. No thyromegaly present.  Cardiovascular: Normal rate, regular rhythm, normal heart sounds and intact distal pulses.   No murmur heard. Pulmonary/Chest: Effort normal and breath sounds normal. No respiratory distress.  Abdominal: Soft. She exhibits no distension. There is no tenderness.  Musculoskeletal: She exhibits no edema.  Lymphadenopathy:    She has no cervical adenopathy.  Neurological: She is alert and oriented to person, place, and time.  Skin: Skin is warm and dry.  Psychiatric: She has a normal mood and affect. Her behavior is normal.  Vitals reviewed.         Assessment & Plan:

## 2016-04-09 NOTE — Progress Notes (Signed)
Pre visit review using our clinic review tool, if applicable. No additional management support is needed unless otherwise documented below in the visit note. 

## 2016-04-09 NOTE — Patient Instructions (Signed)
Follow up 6-8 weeks after surgery to recheck BP and cholesterol DECREASE the HCTZ to 12.5mg  daily (new script sent) Keep up the good work on healthy diet and regular exercise- you're doing great! GOOD LUCK!!!

## 2016-04-10 ENCOUNTER — Ambulatory Visit: Payer: 59 | Admitting: Internal Medicine

## 2016-04-13 NOTE — H&P (Signed)
History of Present Illness [name](Crystal Theard T. Gwendola Hornaday MD; 04/03/2016 9:58 AM) The patient is a 45 year old female who presents with obesity. She returns for her preoperative visit prior to planned laparoscopic Roux-en-Y gastric bypass. Patient was referred by Dr Beverely Lowabori for consideration for surgical treatment for morbid obesity. The patient gives a history of progressive obesity since childhood despite multiple attempts at medical management. She has been through innumerable diet and exercise programs specifically Weight Watchers on a number of occasions and has been able to lose up to 40 pounds but then experiences progressive weight regain. Obesity has been affecting the patient in a number of ways including difficulty getting through routine activities at work or leisure with fatigue and shortness of breath. 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  She has successfully completed her preoperative workup. No concerns on cycle nutrition evaluation. Lab work, chest x-ray and upper GI series unremarkable. She is on her preoperative diet.    Problem List/Past Medical [name]([name], [place]; 04/03/2016 9:40 AM) OBESITY, MORBID, BMI 50 OR HIGHER (E66.01)  Other Problems [name]([name], CMA; 04/03/2016 9:40 AM) Sleep Apnea Oophorectomy Bilateral. Umbilical Hernia Repair Thyroid Disease Migraine Headache Gastroesophageal Reflux Disease Diabetes Mellitus Hypercholesterolemia High blood pressure Depression Anxiety Disorder Chest pain Back Pain  Past Surgical History [name]([name], CMA; 04/03/2016 9:40 AM) Thyroid Surgery Spinal Surgery - Lower Back Oral Surgery Hysterectomy (not due to cancer) - Complete Cesarean Section - 1  Diagnostic Studies History [name]([name], CMA; 04/03/2016 9:40  AM) Mammogram 1-3 years ago Colonoscopy never  Allergies [name]([name], CMA; 04/03/2016 9:40 AM) No Known Drug Allergies09/29/2017  Medication History [name](Crystal Thau T Danica Camarena, MD; 04/03/2016 9:58 AM) Atorvastatin Calcium (40MG  Tablet, Oral) Active. ARIPiprazole (5MG  Tablet, Oral) Active. Carvedilol (25MG  Tablet, Oral) Active. Gabapentin (400MG  Capsule, Oral) Active. Fenofibrate (160MG  Tablet, Oral) Active. HydroCHLOROthiazide (25MG  Tablet, Oral) Active. Lantus SoloStar (100UNIT/ML Soln Pen-inj, Subcutaneous) Active. Lisinopril (20MG  Tablet, Oral) Active. LamoTRIgine (200MG  Tablet, Oral) Active. MetFORMIN HCl (1000MG  Tablet, Oral) Active. Omeprazole (40MG  Capsule DR, Oral) Active. Glimepiride (4MG  Tablet, Oral) Active. RaNITidine HCl (300MG  Tablet, Oral) Active. TEGretol-XR (200MG  Tablet ER 12HR, Oral) Active. Ventolin HFA (108 (90 Base)MCG/ACT Aerosol Soln, Inhalation) Active. Victoza (18MG /3ML Soln Pen-inj, Subcutaneous) Active. Qvar (80MCG/ACT Aerosol Soln, Inhalation) Active. Medications Reconciled OxyCODONE HCl (5MG /5ML Solution, 5-10 Milliliter Oral every four hours, as needed, Taken starting 04/03/2016) Active. Protonix (40MG  Tablet DR, 1 (one) Tablet Oral daily, Taken starting 04/03/2016) Active. Zofran ODT (4MG  Tablet Disint, 1 (one) Tablet Oral every six hours, as needed, Taken starting 04/03/2016) Active. Neomycin Sulfate (500MG  Tablet, 2 (two) Tablet Oral SEE NOTE, Taken starting 04/03/2016) Active. (TAKE TWO TABLETS AT 2 PM, 3 PM, AND 10 PM THE DAY PRIOR TO SURGERY) Flagyl (500MG  Tablet, 2 (two) Tablet Oral SEE NOTE, Taken starting 04/03/2016) Active. (Take at 2pm, 3pm, and 10pm the day prior to your colon operation) Promethazine HCl (12.5MG  Tablet, 1 (one) Tablet Oral every six hours, as needed, Taken starting 04/03/2016) Active.  Social History [name]([name], [place]; 04/03/2016 9:40 AM) Caffeine use Carbonated beverages, Tea. No drug use No  alcohol use Tobacco use Never smoker.  Family History [name]([name], [place]; 04/03/2016 9:40 AM) Diabetes Mellitus Brother, Family Members In General, Mother. Depression Mother. Seizure disorder Family Members In General. Respiratory Condition Father, Mother. Heart disease in female family member before age 44 Heart Disease Family Members In General, Mother. Migraine Headache Brother,  Mother. Hypertension Father.  Pregnancy / Birth History [name]([name], [place]; 04/03/2016 9:40 AM) Maternal age 44-25 Gravida 1 Para 1 Contraceptive History Depo-provera, Oral contraceptives. Age at menarche 12 years.  Vitals [name]([name] CMA; 04/03/2016 9:41 AM) 04/03/2016 9:41 AM Weight: 354.8 lb Height: 69in Body Surface Area: 2.64 m Body Mass Index: 52.39 kg/m  Temp.: 98.79F  Pulse: 73 (Regular)  BP: 126/74 (Sitting, Left Arm, Standard)       Physical Exam [name](Crystal Falcon T. Johnae Friley MD; 04/03/2016 9:59 AM) The physical exam findings are as follows: Note:General: Alert, obese Caucasian female, in no distress Skin: Warm and dry without rash or infection. HEENT: No palpable masses or thyromegaly. Sclera nonicteric. Pupils equal round and reactive. Lymph nodes: No cervical, supraclavicular, nodes palpable. Lungs: Breath sounds clear and equal. No wheezing or increased work of breathing. Cardiovascular: Regular rate and rhythm without murmer. No JVD or edema. Abdomen: Nondistended. Soft and nontender. No masses palpable. No organomegaly. No palpable hernias. Extremities: No edema or joint swelling or deformity. No chronic venous stasis changes. Neurologic: Alert and fully oriented. Gait normal. No focal weakness. Psychiatric: Normal mood and affect. Thought content appropriate with normal judgement and insight    Assessment & Plan [name](Crystal Freiman T. Remo Kirschenmann MD; 04/03/2016 10:00 AM) OBESITY, MORBID, BMI 50 OR HIGHER (E66.01) Impression: Patient with progressive morbid obesity  unresponsive to multiple efforts at medical management who presents with a BMI of 52 and comorbidities of insulin-dependent diabetes mellitus, GERD, obstructive sleep apnea, chronic joint pain and arthritis and dyslipidemia.. I believe there would be very significant medical benefit from surgical weight loss. After our discussion of surgical options currently available the patient has decided to proceed with laparoscopic Roux-en-Y gastric bypass due to specific effects on diabetes and reflux. She has completed her preoperative workup. We reviewed the procedure and recovery and consent form today and all of her and her husband's questions were answered. She is given prescriptions for pain and nausea medications.

## 2016-04-14 ENCOUNTER — Encounter (HOSPITAL_COMMUNITY): Payer: Self-pay | Admitting: *Deleted

## 2016-04-14 ENCOUNTER — Inpatient Hospital Stay (HOSPITAL_COMMUNITY)
Admission: RE | Admit: 2016-04-14 | Discharge: 2016-04-16 | DRG: 621 | Disposition: A | Discharge status: Home / Self Care | Payer: 59 | Source: Ambulatory Visit | Attending: General Surgery | Admitting: General Surgery

## 2016-04-14 ENCOUNTER — Encounter (HOSPITAL_COMMUNITY)
Admission: RE | Disposition: A | Discharge status: Home / Self Care | Payer: Self-pay | Source: Ambulatory Visit | Attending: General Surgery

## 2016-04-14 ENCOUNTER — Inpatient Hospital Stay (HOSPITAL_COMMUNITY): Payer: 59 | Admitting: Anesthesiology

## 2016-04-14 DIAGNOSIS — I10 Essential (primary) hypertension: Secondary | ICD-10-CM | POA: Diagnosis not present

## 2016-04-14 DIAGNOSIS — Z7984 Long term (current) use of oral hypoglycemic drugs: Secondary | ICD-10-CM | POA: Diagnosis not present

## 2016-04-14 DIAGNOSIS — R079 Chest pain, unspecified: Secondary | ICD-10-CM | POA: Diagnosis present

## 2016-04-14 DIAGNOSIS — Z794 Long term (current) use of insulin: Secondary | ICD-10-CM | POA: Diagnosis not present

## 2016-04-14 DIAGNOSIS — F419 Anxiety disorder, unspecified: Secondary | ICD-10-CM | POA: Diagnosis present

## 2016-04-14 DIAGNOSIS — K219 Gastro-esophageal reflux disease without esophagitis: Secondary | ICD-10-CM | POA: Diagnosis present

## 2016-04-14 DIAGNOSIS — G43909 Migraine, unspecified, not intractable, without status migrainosus: Secondary | ICD-10-CM | POA: Diagnosis present

## 2016-04-14 DIAGNOSIS — G8929 Other chronic pain: Secondary | ICD-10-CM | POA: Diagnosis present

## 2016-04-14 DIAGNOSIS — M2550 Pain in unspecified joint: Secondary | ICD-10-CM | POA: Diagnosis present

## 2016-04-14 DIAGNOSIS — E119 Type 2 diabetes mellitus without complications: Secondary | ICD-10-CM | POA: Diagnosis present

## 2016-04-14 DIAGNOSIS — E785 Hyperlipidemia, unspecified: Secondary | ICD-10-CM | POA: Diagnosis present

## 2016-04-14 DIAGNOSIS — Z79899 Other long term (current) drug therapy: Secondary | ICD-10-CM | POA: Diagnosis not present

## 2016-04-14 DIAGNOSIS — E782 Mixed hyperlipidemia: Secondary | ICD-10-CM | POA: Diagnosis not present

## 2016-04-14 DIAGNOSIS — F329 Major depressive disorder, single episode, unspecified: Secondary | ICD-10-CM | POA: Diagnosis present

## 2016-04-14 DIAGNOSIS — G4733 Obstructive sleep apnea (adult) (pediatric): Secondary | ICD-10-CM | POA: Diagnosis not present

## 2016-04-14 DIAGNOSIS — E7800 Pure hypercholesterolemia, unspecified: Secondary | ICD-10-CM | POA: Diagnosis present

## 2016-04-14 DIAGNOSIS — M479 Spondylosis, unspecified: Secondary | ICD-10-CM | POA: Diagnosis present

## 2016-04-14 DIAGNOSIS — Z79891 Long term (current) use of opiate analgesic: Secondary | ICD-10-CM | POA: Diagnosis not present

## 2016-04-14 DIAGNOSIS — Z6841 Body Mass Index (BMI) 40.0 and over, adult: Secondary | ICD-10-CM

## 2016-04-14 DIAGNOSIS — K449 Diaphragmatic hernia without obstruction or gangrene: Secondary | ICD-10-CM | POA: Diagnosis present

## 2016-04-14 DIAGNOSIS — E1165 Type 2 diabetes mellitus with hyperglycemia: Secondary | ICD-10-CM

## 2016-04-14 HISTORY — PX: 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 REPAIR: SHX6513

## 2016-04-14 LAB — GLUCOSE, CAPILLARY
GLUCOSE-CAPILLARY: 261 mg/dL — AB (ref 65–99)
GLUCOSE-CAPILLARY: 286 mg/dL — AB (ref 65–99)
GLUCOSE-CAPILLARY: 240 mg/dL — AB (ref 65–99)
Glucose-Capillary: 169 mg/dL — ABNORMAL HIGH (ref 65–99)
Glucose-Capillary: 177 mg/dL — ABNORMAL HIGH (ref 65–99)

## 2016-04-14 LAB — HEMOGLOBIN AND HEMATOCRIT, BLOOD
HCT: 39.4 % (ref 36.0–46.0)
Hemoglobin: 12.9 g/dL (ref 12.0–15.0)

## 2016-04-14 SURGERY — CREATION, GASTRIC BYPASS, LAPAROSCOPIC, USING ROUX-EN-Y GASTROENTEROSTOMY, WITH HIATAL HERNIA REPAIR
Anesthesia: General

## 2016-04-14 MED ORDER — EVICEL 5 ML EX KIT
PACK | Freq: Once | CUTANEOUS | Status: DC
  Filled 2016-04-14: qty 1

## 2016-04-14 MED ORDER — CHLORHEXIDINE GLUCONATE CLOTH 2 % EX PADS: 6.0000 | MEDICATED_PAD | Freq: Once | CUTANEOUS | Status: DC

## 2016-04-14 MED ORDER — OXYCODONE HCL 5 MG PO TABS: 5.0000 mg | ORAL_TABLET | Freq: Once | ORAL | Status: DC | PRN

## 2016-04-14 MED ORDER — FENTANYL CITRATE (PF) 250 MCG/5ML IJ SOLN
INTRAMUSCULAR | Status: AC
  Filled 2016-04-14: qty 5

## 2016-04-14 MED ORDER — CEFOTETAN DISODIUM-DEXTROSE 2-2.08 GM-% IV SOLR
INTRAVENOUS | Status: AC
  Filled 2016-04-14: qty 50

## 2016-04-14 MED ORDER — INSULIN ASPART 100 UNIT/ML ~~LOC~~ SOLN
SUBCUTANEOUS | Status: AC
  Filled 2016-04-14: qty 1

## 2016-04-14 MED ORDER — HEPARIN SODIUM (PORCINE) 5000 UNIT/ML IJ SOLN
5000.0000 [IU] | INTRAMUSCULAR | Status: AC
  Administered 2016-04-14: 5000 [IU] via SUBCUTANEOUS
  Filled 2016-04-14: qty 1

## 2016-04-14 MED ORDER — ROCURONIUM BROMIDE 50 MG/5ML IV SOSY
PREFILLED_SYRINGE | INTRAVENOUS | Status: AC
  Filled 2016-04-14: qty 5

## 2016-04-14 MED ORDER — CEFOTETAN DISODIUM-DEXTROSE 2-2.08 GM-% IV SOLR
2.0000 g | INTRAVENOUS | Status: AC
  Administered 2016-04-14: 2 g via INTRAVENOUS

## 2016-04-14 MED ORDER — EPHEDRINE SULFATE-NACL 50-0.9 MG/10ML-% IV SOSY
PREFILLED_SYRINGE | INTRAVENOUS | Status: DC | PRN
  Administered 2016-04-14: 5 mg via INTRAVENOUS

## 2016-04-14 MED ORDER — STERILE WATER FOR IRRIGATION IR SOLN
Status: DC | PRN
  Administered 2016-04-14: 1000 mL

## 2016-04-14 MED ORDER — PROPOFOL 10 MG/ML IV BOLUS
INTRAVENOUS | Status: DC | PRN
  Administered 2016-04-14: 200 mg via INTRAVENOUS

## 2016-04-14 MED ORDER — POTASSIUM CHLORIDE IN NACL 20-0.9 MEQ/L-% IV SOLN
INTRAVENOUS | Status: DC
  Administered 2016-04-15 – 2016-04-16 (×2): via INTRAVENOUS
  Filled 2016-04-14 (×4): qty 1000

## 2016-04-14 MED ORDER — PANTOPRAZOLE SODIUM 40 MG IV SOLR
40.0000 mg | Freq: Every day | INTRAVENOUS | Status: DC
  Administered 2016-04-14 – 2016-04-15 (×2): 40 mg via INTRAVENOUS
  Filled 2016-04-14 (×2): qty 40

## 2016-04-14 MED ORDER — MIDAZOLAM HCL 2 MG/2ML IJ SOLN
INTRAMUSCULAR | Status: AC
  Filled 2016-04-14: qty 2

## 2016-04-14 MED ORDER — ENOXAPARIN SODIUM 30 MG/0.3ML ~~LOC~~ SOLN
30.0000 mg | Freq: Two times a day (BID) | SUBCUTANEOUS | Status: DC
  Administered 2016-04-14 – 2016-04-16 (×3): 30 mg via SUBCUTANEOUS
  Filled 2016-04-14 (×4): qty 0.3

## 2016-04-14 MED ORDER — HYDROMORPHONE HCL 1 MG/ML IJ SOLN
0.2500 mg | INTRAMUSCULAR | Status: DC | PRN
  Administered 2016-04-14 (×2): 0.5 mg via INTRAVENOUS

## 2016-04-14 MED ORDER — OXYCODONE HCL 5 MG/5ML PO SOLN
5.0000 mg | ORAL | Status: DC | PRN
  Administered 2016-04-15 – 2016-04-16 (×4): 10 mg via ORAL
  Filled 2016-04-14 (×4): qty 10

## 2016-04-14 MED ORDER — PROMETHAZINE HCL 25 MG/ML IJ SOLN
INTRAMUSCULAR | Status: AC
  Filled 2016-04-14: qty 1

## 2016-04-14 MED ORDER — LACTATED RINGERS IR SOLN
Status: DC | PRN
  Administered 2016-04-14: 3000 mL

## 2016-04-14 MED ORDER — PREMIER PROTEIN SHAKE
2.0000 [oz_av] | ORAL | Status: DC
  Administered 2016-04-16 (×4): 2 [oz_av] via ORAL

## 2016-04-14 MED ORDER — PROMETHAZINE HCL 25 MG/ML IJ SOLN
12.5000 mg | INTRAMUSCULAR | Status: DC | PRN
  Administered 2016-04-14 – 2016-04-15 (×3): 12.5 mg via INTRAVENOUS
  Filled 2016-04-14 (×3): qty 1

## 2016-04-14 MED ORDER — SODIUM CHLORIDE 0.9 % IJ SOLN
INTRAMUSCULAR | Status: DC | PRN
Start: 2016-04-14 — End: 2016-04-14
  Administered 2016-04-14: 40 mL

## 2016-04-14 MED ORDER — SODIUM CHLORIDE 0.9 % IJ SOLN
INTRAMUSCULAR | Status: AC
  Filled 2016-04-14: qty 50

## 2016-04-14 MED ORDER — PROPOFOL 10 MG/ML IV BOLUS
INTRAVENOUS | Status: AC
  Filled 2016-04-14: qty 40

## 2016-04-14 MED ORDER — EVICEL 5 ML EX KIT
PACK | Freq: Once | CUTANEOUS | Status: AC
  Administered 2016-04-14: 5 mL
  Filled 2016-04-14: qty 1

## 2016-04-14 MED ORDER — HYDROMORPHONE HCL 1 MG/ML IJ SOLN
INTRAMUSCULAR | Status: DC | PRN
  Administered 2016-04-14 (×2): 0.5 mg via INTRAVENOUS

## 2016-04-14 MED ORDER — FENTANYL CITRATE (PF) 100 MCG/2ML IJ SOLN
INTRAMUSCULAR | Status: DC | PRN
  Administered 2016-04-14 (×5): 50 ug via INTRAVENOUS

## 2016-04-14 MED ORDER — INSULIN ASPART 100 UNIT/ML ~~LOC~~ SOLN
0.0000 [IU] | SUBCUTANEOUS | Status: DC
  Administered 2016-04-14: 11 [IU] via SUBCUTANEOUS
  Administered 2016-04-14: 7 [IU] via SUBCUTANEOUS
  Administered 2016-04-15: 3 [IU] via SUBCUTANEOUS
  Administered 2016-04-15: 4 [IU] via SUBCUTANEOUS
  Administered 2016-04-15: 3 [IU] via SUBCUTANEOUS
  Administered 2016-04-15 (×2): 4 [IU] via SUBCUTANEOUS
  Administered 2016-04-16 (×2): 3 [IU] via SUBCUTANEOUS
  Administered 2016-04-16: 4 [IU] via SUBCUTANEOUS
  Administered 2016-04-16: 3 [IU] via SUBCUTANEOUS

## 2016-04-14 MED ORDER — INSULIN ASPART 100 UNIT/ML ~~LOC~~ SOLN
4.0000 [IU] | Freq: Once | SUBCUTANEOUS | Status: AC
  Administered 2016-04-14: 4 [IU] via SUBCUTANEOUS

## 2016-04-14 MED ORDER — OXYCODONE HCL 5 MG/5ML PO SOLN
5.0000 mg | Freq: Once | ORAL | Status: DC | PRN
  Filled 2016-04-14: qty 5

## 2016-04-14 MED ORDER — ROCURONIUM BROMIDE 50 MG/5ML IV SOSY
PREFILLED_SYRINGE | INTRAVENOUS | Status: AC
  Filled 2016-04-14: qty 10

## 2016-04-14 MED ORDER — MIDAZOLAM HCL 5 MG/5ML IJ SOLN
INTRAMUSCULAR | Status: DC | PRN
  Administered 2016-04-14: 2 mg via INTRAVENOUS

## 2016-04-14 MED ORDER — BUPIVACAINE HCL (PF) 0.5 % IJ SOLN
INTRAMUSCULAR | Status: DC | PRN
  Administered 2016-04-14: 20 mL

## 2016-04-14 MED ORDER — SUCCINYLCHOLINE CHLORIDE 200 MG/10ML IV SOSY
PREFILLED_SYRINGE | INTRAVENOUS | Status: DC | PRN
  Administered 2016-04-14: 200 mg via INTRAVENOUS

## 2016-04-14 MED ORDER — SUGAMMADEX SODIUM 200 MG/2ML IV SOLN
INTRAVENOUS | Status: DC | PRN
  Administered 2016-04-14: 300 mg via INTRAVENOUS

## 2016-04-14 MED ORDER — EPHEDRINE SULFATE 50 MG/ML IJ SOLN
INTRAMUSCULAR | Status: DC | PRN
  Administered 2016-04-14: 5 mg via INTRAVENOUS
  Administered 2016-04-14: 10 mg via INTRAVENOUS

## 2016-04-14 MED ORDER — METOPROLOL TARTRATE 5 MG/5ML IV SOLN
5.0000 mg | Freq: Four times a day (QID) | INTRAVENOUS | Status: DC
  Administered 2016-04-15 – 2016-04-16 (×6): 5 mg via INTRAVENOUS
  Filled 2016-04-14 (×7): qty 5

## 2016-04-14 MED ORDER — PROMETHAZINE HCL 25 MG/ML IJ SOLN
6.2500 mg | INTRAMUSCULAR | Status: DC | PRN
  Administered 2016-04-14: 6.25 mg via INTRAVENOUS

## 2016-04-14 MED ORDER — ROCURONIUM BROMIDE 10 MG/ML (PF) SYRINGE
PREFILLED_SYRINGE | INTRAVENOUS | Status: DC | PRN
  Administered 2016-04-14: 30 mg via INTRAVENOUS
  Administered 2016-04-14 (×2): 20 mg via INTRAVENOUS
  Administered 2016-04-14 (×4): 10 mg via INTRAVENOUS
  Administered 2016-04-14: 20 mg via INTRAVENOUS
  Administered 2016-04-14: 50 mg via INTRAVENOUS
  Administered 2016-04-14 (×2): 30 mg via INTRAVENOUS

## 2016-04-14 MED ORDER — ACETAMINOPHEN 160 MG/5ML PO SOLN
650.0000 mg | ORAL | Status: DC | PRN
  Administered 2016-04-15: 650 mg via ORAL
  Filled 2016-04-14: qty 20.3

## 2016-04-14 MED ORDER — BUPIVACAINE HCL (PF) 0.5 % IJ SOLN
INTRAMUSCULAR | Status: AC
  Filled 2016-04-14: qty 30

## 2016-04-14 MED ORDER — ACETAMINOPHEN 160 MG/5ML PO SOLN: 325.0000 mg | ORAL | Status: DC | PRN

## 2016-04-14 MED ORDER — BUPIVACAINE LIPOSOME 1.3 % IJ SUSP
20.0000 mL | Freq: Once | INTRAMUSCULAR | Status: AC
  Administered 2016-04-14: 20 mL
  Filled 2016-04-14: qty 20

## 2016-04-14 MED ORDER — HYDROMORPHONE HCL 2 MG/ML IJ SOLN
INTRAMUSCULAR | Status: AC
  Filled 2016-04-14: qty 1

## 2016-04-14 MED ORDER — LACTATED RINGERS IV SOLN
INTRAVENOUS | Status: DC
  Administered 2016-04-14 (×3): via INTRAVENOUS

## 2016-04-14 MED ORDER — MORPHINE SULFATE (PF) 2 MG/ML IV SOLN
2.0000 mg | INTRAVENOUS | Status: DC | PRN
  Administered 2016-04-14 – 2016-04-15 (×2): 2 mg via INTRAVENOUS
  Filled 2016-04-14 (×2): qty 1

## 2016-04-14 MED ORDER — HYDROMORPHONE HCL 1 MG/ML IJ SOLN
INTRAMUSCULAR | Status: AC
  Filled 2016-04-14: qty 1

## 2016-04-14 MED ORDER — INSULIN GLARGINE 100 UNIT/ML ~~LOC~~ SOLN
20.0000 [IU] | Freq: Every day | SUBCUTANEOUS | Status: DC
  Administered 2016-04-14 – 2016-04-15 (×2): 20 [IU] via SUBCUTANEOUS
  Filled 2016-04-14 (×3): qty 0.2

## 2016-04-14 SURGICAL SUPPLY — 89 items
ADH SKN CLS APL DERMABOND .7 (GAUZE/BANDAGES/DRESSINGS) ×1
APPLICATOR COTTON TIP 6IN STRL (MISCELLANEOUS) ×4 IMPLANT
APPLIER CLIP ROT 10 11.4 M/L (STAPLE)
APPLIER CLIP ROT 13.4 12 LRG (CLIP)
APR CLP LRG 13.4X12 ROT 20 MLT (CLIP)
APR CLP MED LRG 11.4X10 (STAPLE)
BAG SPEC RTRVL LRG 6X4 10 (ENDOMECHANICALS)
BLADE SURG SZ11 CARB STEEL (BLADE) ×2 IMPLANT
CABLE HIGH FREQUENCY MONO STRZ (ELECTRODE) ×2 IMPLANT
CHLORAPREP W/TINT 26ML (MISCELLANEOUS) ×2 IMPLANT
CLIP APPLIE ROT 10 11.4 M/L (STAPLE) IMPLANT
CLIP APPLIE ROT 13.4 12 LRG (CLIP) IMPLANT
CLIP SUT LAPRA TY ABSORB (SUTURE) ×4 IMPLANT
COVER SURGICAL LIGHT HANDLE (MISCELLANEOUS) ×1 IMPLANT
CUTTER FLEX LINEAR 45M (STAPLE) ×2 IMPLANT
DECANTER SPIKE VIAL GLASS SM (MISCELLANEOUS) ×2 IMPLANT
DERMABOND ADVANCED (GAUZE/BANDAGES/DRESSINGS) ×1
DERMABOND ADVANCED .7 DNX12 (GAUZE/BANDAGES/DRESSINGS) ×1 IMPLANT
DEVICE SUT QUICK LOAD TK 5 (STAPLE) IMPLANT
DEVICE SUT TI-KNOT TK 5X26 (MISCELLANEOUS) IMPLANT
DEVICE SUTURE ENDOST 10MM (ENDOMECHANICALS) ×1 IMPLANT
DISSECTOR BLUNT TIP ENDO 5MM (MISCELLANEOUS) IMPLANT
DRAIN PENROSE 18X1/4 LTX STRL (WOUND CARE) ×2 IMPLANT
ELECT REM PT RETURN 9FT ADLT (ELECTROSURGICAL) ×2
ELECTRODE REM PT RTRN 9FT ADLT (ELECTROSURGICAL) ×1 IMPLANT
GAUZE SPONGE 4X4 12PLY STRL (GAUZE/BANDAGES/DRESSINGS) IMPLANT
GAUZE SPONGE 4X4 16PLY XRAY LF (GAUZE/BANDAGES/DRESSINGS) ×2 IMPLANT
GLOVE BIOGEL PI IND STRL 7.5 (GLOVE) ×1 IMPLANT
GLOVE BIOGEL PI INDICATOR 7.5 (GLOVE) ×1
GLOVE ECLIPSE 7.5 STRL STRAW (GLOVE) ×2 IMPLANT
GOWN STRL REUS W/TWL XL LVL3 (GOWN DISPOSABLE) ×6 IMPLANT
HEMOSTAT SURGICEL 4X8 (HEMOSTASIS) IMPLANT
HOVERMATT SINGLE USE (MISCELLANEOUS) ×2 IMPLANT
IRRIG SUCT STRYKERFLOW 2 WTIP (MISCELLANEOUS) ×2
IRRIGATION SUCT STRKRFLW 2 WTP (MISCELLANEOUS) ×1 IMPLANT
KIT BASIN OR (CUSTOM PROCEDURE TRAY) ×2 IMPLANT
KIT GASTRIC LAVAGE 34FR ADT (SET/KITS/TRAYS/PACK) ×2 IMPLANT
LUBRICANT JELLY K Y 4OZ (MISCELLANEOUS) IMPLANT
MARKER SKIN DUAL TIP RULER LAB (MISCELLANEOUS) ×2 IMPLANT
NEEDLE SPNL 22GX3.5 QUINCKE BK (NEEDLE) ×2 IMPLANT
PACK CARDIOVASCULAR III (CUSTOM PROCEDURE TRAY) ×2 IMPLANT
POUCH SPECIMEN RETRIEVAL 10MM (ENDOMECHANICALS) IMPLANT
RELOAD 45 VASCULAR/THIN (ENDOMECHANICALS) ×2 IMPLANT
RELOAD ENDO STITCH 2.0 (ENDOMECHANICALS) ×18
RELOAD STAPLE 45 2.5 WHT GRN (ENDOMECHANICALS) ×1 IMPLANT
RELOAD STAPLE 45 3.5 BLU ETS (ENDOMECHANICALS) ×1 IMPLANT
RELOAD STAPLE 60 2.6 WHT THN (STAPLE) ×1 IMPLANT
RELOAD STAPLE 60 3.6 BLU REG (STAPLE) ×2 IMPLANT
RELOAD STAPLE 60 3.8 GOLD REG (STAPLE) ×1 IMPLANT
RELOAD STAPLE TA45 3.5 REG BLU (ENDOMECHANICALS) ×2 IMPLANT
RELOAD STAPLER BLUE 60MM (STAPLE) ×4 IMPLANT
RELOAD STAPLER GOLD 60MM (STAPLE) ×1 IMPLANT
RELOAD STAPLER WHITE 60MM (STAPLE) ×1 IMPLANT
RELOAD SUT SNGL STCH ABSRB 2-0 (ENDOMECHANICALS) IMPLANT
RELOAD SUT SNGL STCH BLK 2-0 (ENDOMECHANICALS) IMPLANT
SCISSORS LAP 5X45 EPIX DISP (ENDOMECHANICALS) ×2 IMPLANT
SHEARS HARMONIC ACE PLUS 45CM (MISCELLANEOUS) ×2 IMPLANT
SLEEVE ADV FIXATION 12X100MM (TROCAR) ×4 IMPLANT
SOLUTION ANTI FOG 6CC (MISCELLANEOUS) ×2 IMPLANT
STAPLER ECHELON LONG 60 440 (INSTRUMENTS) ×2 IMPLANT
STAPLER RELOAD BLUE 60MM (STAPLE) ×8
STAPLER RELOAD GOLD 60MM (STAPLE) ×2
STAPLER RELOAD WHITE 60MM (STAPLE) ×2
SUT DEVICE BRAIDED 0X39 (SUTURE) IMPLANT
SUT DVC SILK 2.0X39 (SUTURE) ×10 IMPLANT
SUT DVC VICRYL PGA 2.0X39 (SUTURE) ×10 IMPLANT
SUT MNCRL AB 4-0 PS2 18 (SUTURE) ×2 IMPLANT
SUT RELOAD ENDO STITCH 2 48X1 (ENDOMECHANICALS) ×4
SUT RELOAD ENDO STITCH 2.0 (ENDOMECHANICALS) ×5
SUT SURGIDAC NAB ES-9 0 48 120 (SUTURE) IMPLANT
SUT VIC AB 2-0 SH 27 (SUTURE)
SUT VIC AB 2-0 SH 27X BRD (SUTURE) IMPLANT
SUTURE RELOAD END STTCH 2 48X1 (ENDOMECHANICALS) ×4 IMPLANT
SUTURE RELOAD ENDO STITCH 2.0 (ENDOMECHANICALS) ×5 IMPLANT
SYR 10ML ECCENTRIC (SYRINGE) ×2 IMPLANT
SYR 20CC LL (SYRINGE) ×4 IMPLANT
SYR 50ML LL SCALE MARK (SYRINGE) ×2 IMPLANT
TIP RIGID 35CM EVICEL (HEMOSTASIS) ×2 IMPLANT
TOWEL OR 17X26 10 PK STRL BLUE (TOWEL DISPOSABLE) ×2 IMPLANT
TOWEL OR NON WOVEN STRL DISP B (DISPOSABLE) ×2 IMPLANT
TRAY FOLEY CATH 14FRSI W/METER (CATHETERS) ×1 IMPLANT
TRAY FOLEY W/METER SILVER 16FR (SET/KITS/TRAYS/PACK) IMPLANT
TROCAR ADV FIXATION 12X100MM (TROCAR) ×2 IMPLANT
TROCAR ADV FIXATION 5X100MM (TROCAR) ×2 IMPLANT
TROCAR BLADELESS OPT 5 100 (ENDOMECHANICALS) ×2 IMPLANT
TROCAR XCEL 12X100 BLDLESS (ENDOMECHANICALS) ×2 IMPLANT
TUBING CONNECTING 10 (TUBING) ×2 IMPLANT
TUBING ENDO SMARTCAP PENTAX (MISCELLANEOUS) ×2 IMPLANT
TUBING INSUF HEATED (TUBING) ×2 IMPLANT

## 2016-04-14 NOTE — Op Note (Signed)
Preoperative diagnosis: Roux-en-Y gastric bypass  Postoperative diagnosis: Same   Procedure: Upper endoscopy   Surgeon: Berna Buehelsea A Nevah Dalal, M.D.  Anesthesia: Gen.   Indications for procedure: This patient was undergoing a Roux-en-Y gastric bypass.   Description of procedure: The endoscopy was placed in the mouth and into the oropharynx and under endoscopic vision it was advanced to the esophagogastric junction. The pouch was insufflated and no bleeding or bubbles were seen. The GEJ was identified at 44cm from the teeth. The gastrojejunostomy was easily traversed with the endoscope. No bleeding or leaks were detected. The scope was withdrawn without difficulty.   Berna Buehelsea A Kyrra Prada M.D. General, Bariatric, & Minimally Invasive Surgery [facility]

## 2016-04-14 NOTE — Anesthesia Postprocedure Evaluation (Signed)
Anesthesia Post Note  Patient: [name]  Procedure(s) Performed: Procedure(s) (LRB): LAPAROSCOPIC ROUX-EN-Y GASTRIC BYPASS  WITH UPPER ENDOSCOPY (N/A)  Patient location during evaluation: PACU Anesthesia Type: General Level of consciousness: awake and alert and patient cooperative Pain management: pain level controlled Vital Signs Assessment: post-procedure vital signs reviewed and stable Respiratory status: spontaneous breathing and respiratory function stable Cardiovascular status: stable Anesthetic complications: no    Last Vitals:  Vitals:   04/14/16 0913 04/14/16 1452  BP: (!) 154/73 (!) 144/73  Pulse: 72 79  Resp: 16 16  Temp: 37.1 C 36.6 C    Last Pain:  Vitals:   04/14/16 1515  TempSrc:   PainSc: 6                  Lecil Tapp S

## 2016-04-14 NOTE — Interval H&P Note (Signed)
History and Physical Interval Note:  04/14/2016 11:17 AM  [name]  has presented today for surgery, with the diagnosis of MORBID OBESITY  The various methods of treatment have been discussed with the patient and family. After consideration of risks, benefits and other options for treatment, the patient has consented to  Procedure(s): 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 REPAIR WITH UPPER ENDOSCOPY (N/A) as a surgical intervention .  The patient's history has been reviewed, patient examined, no change in status, stable for surgery.  I have reviewed the patient's chart and labs.  Questions were answered to the patient's satisfaction.     [name] T

## 2016-04-14 NOTE — Interval H&P Note (Signed)
History and Physical Interval Note:  04/14/2016 11:16 AM  [name]  has presented today for surgery, with the diagnosis of MORBID OBESITY  The various methods of treatment have been discussed with the patient and family. After consideration of risks, benefits and other options for treatment, the patient has consented to  Procedure(s): 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 REPAIR WITH UPPER ENDOSCOPY (N/A) as a surgical intervention .  The patient's history has been reviewed, patient examined, no change in status, stable for surgery.  I have reviewed the patient's chart and labs.  Questions were answered to the patient's satisfaction.     Suzzane Quilter T

## 2016-04-14 NOTE — Op Note (Signed)
Preop diagnosis: Morbid obesity  Postop diagnosis: Morbid obesity  Body mass index is 50.15 kg/m.  Surgical procedure: Laparoscopic Roux-en-Y gastric bypass  Surgeon: Sharlet SalinaBenjamin T.Giulio Bertino M.D.  Asst.: Phylliss Blakeshelsea Connor M.D.  Anesthesia: General  Complications:  None  EBL: Minimal  Drains: None  Disposition: PACU in good condition  Description of procedure: Patient is brought to the operating room and general anesthesia induced. She had received preoperative broad-spectrum IV antibiotics and subcutaneous heparin. The abdomen was widely sterilely prepped and draped. Patient timeout was performed and correct patient and procedure confirmed. Access was obtained with a 12 mm Optiview trocar in the left upper quadrant and pneumoperitoneum established without difficulty. Under direct vision 12 mm trocars were placed laterally in the right upper quadrant, right upper quadrant midclavicular line, and to the left and above the umbilicus for the camera port. A 5 mm trocar was placed laterally in the left upper quadrant. A bilateral T AP block was performed under direct vision with dilute Exparel. There were omental adhesions to a previous supraumbilical hernia repair with mesh and these were taken down with the harmonic scalpel and no evidence of any bowel involvement. There was a small hernia present just above the mesh, less than 1 cm, and we elected to repair this at the end of the case to prevent bowel incarceration. The omentum was brought into the upper abdomen and the transverse mesocolon elevated and the ligament of Treitz clearly identified. A 40 cm biliopancreatic limb was then carefully measured from the ligament of Treitz. The small intestine was divided at this point with a single firing of the white load linear stapler. A Penrose drain was sutured to the end of the Roux-en-Y limb for later identification. A 100 cm Roux-en-Y limb was then carefully measured. At this point a side-to-side  anastomosis was created between the Roux limb and the end of the biliopancreatic limb. This was accomplished with a single firing of the 45 mm white load linear stapler. The common enterotomy was closed with a running 2-0 Vicryl begun at either end of the enterotomy and tied centrally. The mesenteric defect was then closed with running 2-0 silk. The omentum was then divided with the harmonic scalpel up towards the transverse colon to allow mobility of the Roux limb toward the gastric pouch. The patient was then placed in steep reversed Trendelenburg. Through a 5 mm subxiphoid site the [name] retractor was placed and the left lobe of the liver elevated with excellent exposure of the upper stomach and hiatus. The angle of Hiss was then mobilized with the harmonic scalpel. A 4 cm gastric pouch was then carefully measured along the lesser curve of the stomach. Dissection was carried along the lesser curve at this point with the Harmonic scalpel working carefully back toward the lesser sac at right angles to the lesser curve. The free lesser sac was then entered. After being sure all tubes were removed from the stomach an initial firing of the gold load 60 mm linear stapler was fired at right angles across the lesser curve for about 4 cm. The gastric pouch was further mobilized posteriorly and then the pouch was completed with 2 further firings of the 60 mm blue load linear stapler up through the previously dissected angle of His. It was ensured that the pouch was completely mobilized away from the gastric remnant. This created a nice tubular 4-5 cm gastric pouch. The staple line of the gastric remnant was then oversewn with 2-0 silk for hemostasis. The Roux  limb was then brought up in an antecolic fashion with the candycane facing to the patient's left with slight but not undue tension. The gastrojejunostomy was created with an initial posterior row of 2-0 Vicryl between the Roux limb and the staple line of the  gastric pouch. Enterotomies were then made in the gastric pouch and the Roux limb with the harmonic scalpel and at approximately 2-2-1/2 cm anastomosis was created with a single firing of the blue load linear stapler. The staple line was inspected and was intact without bleeding. The common enterotomy was then closed with running 2-0 Vicryl begun at either end and tied centrally. The [name] tube was then  passed through the anastomosis with some initial difficulty by anesthesia and an outer anterior layer of running 2-0 Vicryl was placed. The [name] tube was removed. With the outlet of the gastrojejunostomy clamped and under saline irrigation the assistant performed upper endoscopy and with the gastric pouch tensely distended with air there was no evidence of leak. The anastomosis was visualized due to the slight difficulty passing the [name] tube and appeared widely patent and the scope was able to be passed into the Roux limb without difficulty. The pouch was desufflated. The [name] defect was closed with running 2-0 silk. The small fascial defect above the supraumbilical mesh was repaired with a single 0 Vicryl suture using the Endo Close device to prevent bowel incarceration postoperatively. The abdomen was inspected for any evidence of bleeding or bowel injury and everything looked fine. The [name] retractor was removed under direct vision after coating the anastomosis with Tisseel tissue sealant. All CO2 was evacuated and trochars removed. Skin incisions were closed with staples. Sponge needle and instrument counts were correct. The patient was taken to the PACU in good condition.     Crystal SaaBenjamin T Riva Sesma MD, FACS  04/14/2016, 2:50 PM

## 2016-04-14 NOTE — Transfer of Care (Signed)
Immediate Anesthesia Transfer of Care Note  Patient: [name]  Procedure(s) Performed: Procedure(s): LAPAROSCOPIC ROUX-EN-Y GASTRIC BYPASS  WITH UPPER ENDOSCOPY (N/A)  Patient Location: PACU  Anesthesia Type:General  Level of Consciousness:  sedated, patient cooperative and responds to stimulation  Airway & Oxygen Therapy:Patient Spontanous Breathing and Patient connected to face mask oxgen  Post-op Assessment:  Report given to PACU RN and Post -op Vital signs reviewed and stable  Post vital signs:  Reviewed and stable  Last Vitals:  Vitals:   04/14/16 0913  BP: (!) 154/73  Pulse: 72  Resp: 16  Temp: 37.1 C    Complications: No apparent anesthesia complications

## 2016-04-14 NOTE — Anesthesia Preprocedure Evaluation (Signed)
Anesthesia Evaluation  Patient identified by MRN, date of birth, ID band Patient awake    Reviewed: Allergy & Precautions, NPO status , Patient's Chart, lab work & pertinent test results  Airway Mallampati: II   Neck ROM: full    Dental   Pulmonary asthma , sleep apnea ,    breath sounds clear to auscultation       Cardiovascular hypertension,  Rhythm:regular Rate:Normal     Neuro/Psych  Headaches, Depression Bipolar Disorder  Neuromuscular disease    GI/Hepatic GERD  ,  Endo/Other  diabetes, Type obesity  Renal/GU      Musculoskeletal   Abdominal   Peds  Hematology   Anesthesia Other Findings   Reproductive/Obstetrics                             Anesthesia Physical Anesthesia Plan  ASA: II  Anesthesia Plan: General   Post-op Pain Management:    Induction: Intravenous  Airway Management Planned: Oral ETT  Additional Equipment:   Intra-op Plan:   Post-operative Plan: Extubation in OR  Informed Consent: I have reviewed the patients History and Physical, chart, labs and discussed the procedure including the risks, benefits and alternatives for the proposed anesthesia with the patient or authorized representative who has indicated his/her understanding and acceptance.     Plan Discussed with: CRNA, Anesthesiologist and Surgeon  Anesthesia Plan Comments:         Anesthesia Quick Evaluation

## 2016-04-14 NOTE — Anesthesia Procedure Notes (Addendum)
Procedure Name: Intubation Date/Time: 04/14/2016 11:36 AM Performed by: Paris LoreBLANTON, Trystyn Sitts M Pre-anesthesia Checklist: Patient identified, Emergency Drugs available, Suction available, Patient being monitored and Timeout performed Patient Re-evaluated:Patient Re-evaluated prior to inductionOxygen Delivery Method: Circle system utilized Preoxygenation: Pre-oxygenation with 100% oxygen Intubation Type: IV induction Ventilation: Mask ventilation without difficulty Laryngoscope Size: Mac and 4 Grade View: Grade II Tube type: Oral Tube size: 7.5 mm Number of attempts: 1 Airway Equipment and Method: Stylet Placement Confirmation: ETT inserted through vocal cords under direct vision,  positive ETCO2,  CO2 detector and breath sounds checked- equal and bilateral Secured at: 20 cm Tube secured with: Tape Dental Injury: Teeth and Oropharynx as per pre-operative assessment  Comments: Per Gabriel CarinaSRNA Amanda

## 2016-04-15 ENCOUNTER — Encounter (HOSPITAL_COMMUNITY): Payer: Self-pay | Admitting: General Surgery

## 2016-04-15 LAB — GLUCOSE, CAPILLARY
GLUCOSE-CAPILLARY: 137 mg/dL — AB (ref 65–99)
GLUCOSE-CAPILLARY: 156 mg/dL — AB (ref 65–99)
GLUCOSE-CAPILLARY: 165 mg/dL — AB (ref 65–99)
Glucose-Capillary: 162 mg/dL — ABNORMAL HIGH (ref 65–99)
Glucose-Capillary: 145 mg/dL — ABNORMAL HIGH (ref 65–99)

## 2016-04-15 LAB — CBC WITH DIFFERENTIAL/PLATELET
BASOS ABS: 0 10*3/uL (ref 0.0–0.1)
Basophils Relative: 0 %
Eosinophils Absolute: 0.1 10*3/uL (ref 0.0–0.7)
Eosinophils Relative: 1 %
HEMATOCRIT: 38.8 % (ref 36.0–46.0)
Hemoglobin: 12.8 g/dL (ref 12.0–15.0)
LYMPHS PCT: 24 %
Lymphs Abs: 1.9 10*3/uL (ref 0.7–4.0)
MCH: 29.2 pg (ref 26.0–34.0)
MCHC: 33 g/dL (ref 30.0–36.0)
MCV: 88.4 fL (ref 78.0–100.0)
MONO ABS: 0.7 10*3/uL (ref 0.1–1.0)
MONOS PCT: 9 %
NEUTROS ABS: 5.3 10*3/uL (ref 1.7–7.7)
Neutrophils Relative %: 66 %
Platelets: 268 10*3/uL (ref 150–400)
RBC: 4.39 MIL/uL (ref 3.87–5.11)
RDW: 13 % (ref 11.5–15.5)
WBC: 8 10*3/uL (ref 4.0–10.5)

## 2016-04-15 LAB — HEMOGLOBIN AND HEMATOCRIT, BLOOD
HCT: 38 % (ref 36.0–46.0)
Hemoglobin: 12.5 g/dL (ref 12.0–15.0)

## 2016-04-15 MED ORDER — CARBAMAZEPINE ER 200 MG PO TB12
200.0000 mg | ORAL_TABLET | Freq: Two times a day (BID) | ORAL | Status: DC
Start: 2016-04-15 — End: 2016-04-15

## 2016-04-15 MED ORDER — ARIPIPRAZOLE 5 MG PO TABS
5.0000 mg | ORAL_TABLET | Freq: Every day | ORAL | Status: DC
  Administered 2016-04-16: 5 mg via ORAL
  Filled 2016-04-15 (×2): qty 1

## 2016-04-15 MED ORDER — CARBAMAZEPINE ER 400 MG PO TB12
400.0000 mg | ORAL_TABLET | Freq: Every day | ORAL | Status: DC
  Administered 2016-04-15: 400 mg via ORAL
  Filled 2016-04-15: qty 1

## 2016-04-15 MED ORDER — LISINOPRIL 20 MG PO TABS
20.0000 mg | ORAL_TABLET | Freq: Every day | ORAL | Status: DC
  Administered 2016-04-15 – 2016-04-16 (×2): 20 mg via ORAL
  Filled 2016-04-15: qty 2
  Filled 2016-04-15: qty 1

## 2016-04-15 MED ORDER — LAMOTRIGINE 100 MG PO TABS
200.0000 mg | ORAL_TABLET | Freq: Two times a day (BID) | ORAL | Status: DC
  Administered 2016-04-15 – 2016-04-16 (×3): 200 mg via ORAL
  Filled 2016-04-15 (×3): qty 2

## 2016-04-15 MED ORDER — CARBAMAZEPINE ER 200 MG PO TB12
200.0000 mg | ORAL_TABLET | Freq: Every morning | ORAL | Status: DC
  Administered 2016-04-16: 200 mg via ORAL
  Filled 2016-04-15 (×2): qty 1

## 2016-04-15 NOTE — Progress Notes (Signed)
1 Day Post-Op  Subjective: Doing "pretty well". Some midabdominal pain when up moving around. Also some nausea related to movement but no vomiting. Up in chair this morning.  Objective: Vital signs in last 24 hours: Temp:  [97.7 F (36.5 C)-99 F (37.2 C)] 98.7 F (37.1 C) (11/21 0451) Pulse Rate:  [68-88] 74 (11/21 0451) Resp:  [14-19] 18 (11/21 0451) BP: (132-158)/(64-91) 135/78 (11/21 0451) SpO2:  [94 %-100 %] 94 % (11/21 0451) Weight:  [154 kg (339 lb 9.6 oz)] 154 kg (339 lb 9.6 oz) (11/20 0913)    Intake/Output from previous day: 11/20 0701 - 11/21 0700 In: 3550 [I.V.:3550] Out: 1275 [Urine:1225; Blood:50] Intake/Output this shift: No intake/output data recorded.  General appearance: alert, cooperative and no distress Resp: clear to auscultation bilaterally GI: normal findings: soft, non-tender Incision/Wound: Clean and dry  Lab Results:   Recent Labs  04/14/16 2003 04/15/16 0440  WBC  --  8.0  HGB 12.9 12.8  HCT 39.4 38.8  PLT  --  268   BMET No results for input(s): NA, K, CL, CO2, GLUCOSE, BUN, CREATININE, CALCIUM in the last 72 hours. CBG (last 3)   Recent Labs  04/14/16 2334 04/15/16 0350 04/15/16 0722  GLUCAP 169* 137* 162*      Studies/Results: No results found.  Anti-infectives: Anti-infectives    Start     Dose/Rate Route Frequency Ordered Stop   04/14/16 0947  cefoTEtan in Dextrose 5% (CEFOTAN) IVPB 2 g     2 g Intravenous On call to O.R. 04/14/16 0947 04/14/16 1150      Assessment/Plan: s/p Procedure(s): LAPAROSCOPIC ROUX-EN-Y GASTRIC BYPASS  WITH UPPER ENDOSCOPY Doing well without apparent complication. Start postop day 1 diet. Pulmonary toilet and ambulation encouraged Restart home meds   LOS: 1 day    Armstrong Creasy T 11/21/2017Patient ID: [name], female   DOB: [birth year], 45 y.o.   MRN: [id number]

## 2016-04-15 NOTE — Progress Notes (Signed)
Patient alert and oriented, Post op day 1.  Provided support and encouragement.  Encouraged pulmonary toilet, ambulation and small sips of liquids.  All questions answered.  Will continue to monitor. 

## 2016-04-15 NOTE — Consult Note (Signed)
   [facility]   04/15/2016  [name] 10/03/1970 [id number]    Came to visit [name] at bedside on behalf of [facility] for [facility] employees/dependents with [facility]. [name] states she is not sure if she would need Link to Wellness at this time. However, states she appreciates the [employer] to [employer] packet. Made her aware that she will receive post hospital discharge call. Confirmed best telephone number at [phone]. Appreciative of visit. [employer] to [employer] packet, contact information, 24-hr nurse line magnet, and brochure provided.    Raiford NobleAtika Jayden Kratochvil, MSN-Ed, RN,BSN [facility] Care [facility] Hospital Liaison [phone]

## 2016-04-15 NOTE — Plan of Care (Signed)
Problem: Food- and Nutrition-Related Knowledge Deficit (NB-1.1) Goal: Nutrition education Formal process to instruct or train a patient/client in a skill or to impart knowledge to help patients/clients voluntarily manage or modify food choices and eating behavior to maintain or improve health. Outcome: Completed/Met Date Met: 04/15/16 Nutrition Education Note  Received consult for diet education per DROP protocol.   Discussed 2 week post op diet with pt. Emphasized that liquids must be non carbonated, non caffeinated, and sugar free. Fluid goals discussed. Reviewed progression of diet to include soft proteins at 7-10 days post-op. Pt to follow up with outpatient bariatric RD for further diet progression after 2 weeks. Multivitamins and minerals also reviewed. Teach back method used, pt expressed understanding, expect good compliance.   Diet: First 2 Weeks  You will see the dietitian about two (2) weeks after your surgery. The dietitian will increase the types of foods you can eat if you are handling liquids well:  If you have severe vomiting or nausea and cannot handle clear liquids lasting longer than 1 day, call your surgeon  Protein Shake  Drink at least 2 ounces of shake 5-6 times per day  Each serving of protein shakes (usually 8 - 12 ounces) should have a minimum of:  15 grams of protein  And no more than 5 grams of carbohydrate  Goal for protein each day:  Men = 80 grams per day  Women = 60 grams per day  Protein powder may be added to fluids such as non-fat milk or Lactaid milk or Soy milk (limit to 35 grams added protein powder per serving)   Hydration  Slowly increase the amount of water and other clear liquids as tolerated (See Acceptable Fluids)  Slowly increase the amount of protein shake as tolerated  Sip fluids slowly and throughout the day  May use sugar substitutes in small amounts (no more than 6 - 8 packets per day; i.e. Splenda)   Fluid Goal  The first goal is to  drink at least 8 ounces of protein shake/drink per day (or as directed by the nutritionist); some examples of protein shakes are [employer], [employer], EAS Edge HP, and Unjury. See handout from pre-op Bariatric Education Class:  Slowly increase the amount of protein shake you drink as tolerated  You may find it easier to slowly sip shakes throughout the day  It is important to get your proteins in first  Your fluid goal is to drink 64 - 100 ounces of fluid daily  It may take a few weeks to build up to this  32 oz (or more) should be clear liquids  And  32 oz (or more) should be full liquids (see below for examples)  Liquids should not contain sugar, caffeine, or carbonation   Clear Liquids:  Water or Sugar-free flavored water (i.e. Fruit H2O, Propel)  Decaffeinated coffee or tea (sugar-free)  Crystal Lite, Wyler's Lite, Minute Maid Lite  Sugar-free Jell-O  Bouillon or broth  Sugar-free Popsicle: *Less than 20 calories each; Limit 1 per day   Full Liquids:  Protein Shakes/Drinks + 2 choices per day of other full liquids  Full liquids must be:  No More Than 12 grams of Carbs per serving  No More Than 3 grams of Fat per serving  Strained low-fat cream soup  Non-Fat milk  Fat-free Lactaid Milk  Sugar-free yogurt (Dannon Lite & Fit, Greek yogurt)     Clayton Bibles, MS, RD, LDN Pager: [phone] After Hours Pager: [phone]

## 2016-04-16 LAB — CBC WITH DIFFERENTIAL/PLATELET
BASOS ABS: 0 10*3/uL (ref 0.0–0.1)
Basophils Relative: 0 %
EOS ABS: 0.3 10*3/uL (ref 0.0–0.7)
EOS PCT: 4 %
HCT: 36.9 % (ref 36.0–46.0)
HEMOGLOBIN: 12.1 g/dL (ref 12.0–15.0)
LYMPHS ABS: 1.9 10*3/uL (ref 0.7–4.0)
LYMPHS PCT: 24 %
MCH: 29 pg (ref 26.0–34.0)
MCHC: 32.8 g/dL (ref 30.0–36.0)
MCV: 88.5 fL (ref 78.0–100.0)
Monocytes Absolute: 0.9 10*3/uL (ref 0.1–1.0)
Monocytes Relative: 11 %
NEUTROS PCT: 61 %
Neutro Abs: 4.7 10*3/uL (ref 1.7–7.7)
PLATELETS: 226 10*3/uL (ref 150–400)
RBC: 4.17 MIL/uL (ref 3.87–5.11)
RDW: 13.2 % (ref 11.5–15.5)
WBC: 7.8 10*3/uL (ref 4.0–10.5)

## 2016-04-16 LAB — GLUCOSE, CAPILLARY
GLUCOSE-CAPILLARY: 177 mg/dL — AB (ref 65–99)
GLUCOSE-CAPILLARY: 137 mg/dL — AB (ref 65–99)
Glucose-Capillary: 124 mg/dL — ABNORMAL HIGH (ref 65–99)
Glucose-Capillary: 149 mg/dL — ABNORMAL HIGH (ref 65–99)

## 2016-04-16 MED ORDER — INSULIN GLARGINE 100 UNIT/ML SOLOSTAR PEN: 30.0000 [IU] | PEN_INJECTOR | Freq: Every day | SUBCUTANEOUS | 3 refills | Status: DC

## 2016-04-16 NOTE — Progress Notes (Signed)
Pt and husband were given discharge instructions. All questions were answered and patient was taken to main entrance via wheelchair. Serene Kopf R McClean

## 2016-04-16 NOTE — Discharge Instructions (Signed)

## 2016-04-16 NOTE — Progress Notes (Signed)
Patient ID: Sindy Guadeloupeammy Tufte, female   DOB: December 13, 1970, 45 y.o.   MRN: [id number]  2 Days Post-Op  Subjective: No complaints this morning. Feels well. Tolerating protein shakes without pain or nausea. Ambulatory. Feels ready to go home.  Objective: Vital signs in last 24 hours: Temp:  [97.9 F (36.6 C)-99.4 F (37.4 C)] 99.4 F (37.4 C) (11/22 0923) Pulse Rate:  [70-81] 77 (11/22 0923) Resp:  [16-18] 16 (11/22 0923) BP: (143-171)/(70-79) 171/79 (11/22 0923) SpO2:  [93 %-95 %] 94 % (11/22 0923)    Intake/Output from previous day: 11/21 0701 - 11/22 0700 In: 1250 [P.O.:150; I.V.:1100] Out: 950 [Urine:950] Intake/Output this shift: Total I/O In: 20 [P.O.:20] Out: -   General appearance: alert, cooperative and no distress GI: normal findings: soft, non-tender Incision/Wound: Clean and dry  Lab Results:   Recent Labs  04/15/16 0440 04/15/16 1620 04/16/16 0444  WBC 8.0  --  7.8  HGB 12.8 12.5 12.1  HCT 38.8 38.0 36.9  PLT 268  --  226   BMET No results for input(s): NA, K, CL, CO2, GLUCOSE, BUN, CREATININE, CALCIUM in the last 72 hours.  CBG (last 3)   Recent Labs  04/16/16 0016 04/16/16 0413 04/16/16 0754  GLUCAP 124* 177* 149*     Studies/Results: No results found.  Anti-infectives: Anti-infectives    Start     Dose/Rate Route Frequency Ordered Stop   04/14/16 0947  cefoTEtan in Dextrose 5% (CEFOTAN) IVPB 2 g     2 g Intravenous On call to O.R. 04/14/16 0947 04/14/16 1150      Assessment/Plan: s/p Procedure(s): LAPAROSCOPIC ROUX-EN-Y GASTRIC BYPASS  WITH UPPER ENDOSCOPY Doing very well postoperatively without complication. Ready for discharge.   LOS: 2 days    Georgene Kopper T 04/16/2016

## 2016-04-16 NOTE — Discharge Summary (Signed)
Patient ID: Sindy Guadeloupeammy Marines [id number] 45 y.o. 02-Mar-1971  04/14/2016  Discharge date and time: 04/16/2016   Admitting Physician: Glenna FellowsHOXWORTH,Abygayle Deltoro T  Discharge Physician: Glenna FellowsHOXWORTH,Dierdre Mccalip T  Admission Diagnoses: MORBID OBESITY  Discharge Diagnoses: Same  Operations: Procedure(s): LAPAROSCOPIC ROUX-EN-Y GASTRIC BYPASS  WITH UPPER ENDOSCOPY  Admission Condition: fair  Discharged Condition: fair  Indication for Admission: Patient is a 45 year old female with progressive morbid obesity unresponsive to medical management who presents with a BMI of 52 with comorbidities of insulin-dependent diabetes mellitus, GERD, obstructive sleep apnea, chronic joint pain and dyslipidemia. After extensive preoperative workup and discussion detailed elsewhere she is admitted electively for laparoscopic Roux-en-Y gastric bypass for treatment of her morbid obesity.  Hospital Course: On the morning of admission the patient underwent an uneventful laparoscopic Roux-en-Y gastric bypass. Her postoperative course was uncomplicated. On the first postoperative day she had minimal discomfort. Vital signs were stable with a normal CBC. She was started on ice and water. On the second postoperative day she denies any pain or nausea. She is ambulatory. Tolerating protein shakes. Blood sugars under reasonably good control and CBC normal. She is felt ready for discharge.   Disposition: Home  Patient Instructions:    Medication List    STOP taking these medications   estradiol 1 MG tablet Commonly known as:  ESTRACE   glimepiride 4 MG tablet Commonly known as:  AMARYL   VICTOZA 18 MG/3ML Sopn Generic drug:  liraglutide     TAKE these medications   acetaminophen 500 MG tablet Commonly known as:  TYLENOL Take 1,000 mg by mouth every 6 (six) hours as needed for mild pain.   ARIPiprazole 5 MG tablet Commonly known as:  ABILIFY Take 1 tablet by mouth daily.   atorvastatin 40 MG tablet Commonly known as:   LIPITOR TAKE 1 TABLET BY MOUTH DAILY   B-D UF III MINI PEN NEEDLES 31G X 5 MM Misc Generic drug:  Insulin Pen Needle USE TO INJECT INSULIN 2 TIMES DAILY   CALTRATE 600+D PO Take 1 tablet by mouth 2 (two) times daily.   carbamazepine 200 MG 12 hr tablet Commonly known as:  TEGRETOL XR Take 200-400 mg by mouth 2 (two) times daily. 1 tablet in the morning, 2 at night   carvedilol 25 MG tablet Commonly known as:  COREG TAKE 1 TABLET BY MOUTH 2 TIMES DAILY. Notes to patient:  Monitor Blood Pressure Daily and keep a log for primary care physician.  You may need to make changes to your medications with rapid weight loss.     CENTRUM ULTRA WOMENS PO Take 1 tablet by mouth daily.   cholecalciferol 1000 units tablet Commonly known as:  VITAMIN D Take 2,000 Units by mouth daily.   clobetasol ointment 0.05 % Commonly known as:  TEMOVATE Apply 1 application topically 2 (two) times daily. Do not use for more than 7 days What changed:  when to take this  reasons to take this  additional instructions   fenofibrate 160 MG tablet TAKE 1 TABLET BY MOUTH DAILY   fexofenadine 180 MG tablet Commonly known as:  ALLEGRA Take 180 mg by mouth daily. supper   fluticasone 50 MCG/ACT nasal spray Commonly known as:  FLONASE Place 2 sprays into both nostrils daily. What changed:  when to take this  reasons to take this   gabapentin 600 MG tablet Commonly known as:  NEURONTIN Take 300 mg by mouth 4 (four) times daily. Patient divids dose up through e day   hydrochlorothiazide 12.5 MG  tablet Commonly known as:  HYDRODIURIL Take 1 tablet (12.5 mg total) by mouth daily. Notes to patient:  Monitor Blood Pressure Daily and keep a log for primary care physician.  Monitor for symptoms of dehydration.  You may need to make changes to your medications with rapid weight loss.     Insulin Glargine 100 UNIT/ML Solostar Pen Commonly known as:  LANTUS SOLOSTAR Inject 30 Units into the skin daily at  10 pm. What changed:  See the new instructions.   lamoTRIgine 200 MG tablet Commonly known as:  LAMICTAL Take 200 mg by mouth 2 (two) times daily.   Lancets 30G Misc Use one lancet each time sugars are checked. Pt tests twice daily. DX E11.9   lisinopril 20 MG tablet Commonly known as:  PRINIVIL,ZESTRIL TAKE 1 TABLET (20 MG TOTAL) BY MOUTH DAILY. What changed:  additional instructions   metFORMIN 1000 MG tablet Commonly known as:  GLUCOPHAGE TAKE 1 TABLET (1,000 MG TOTAL) BY MOUTH 2 TIMES DAILY WITH MEAL What changed:  See the new instructions.   omeprazole 40 MG capsule Commonly known as:  PRILOSEC TAKE 1 CAPSULE BY MOUTH TWICE A DAY   ONE TOUCH ULTRA TEST test strip Generic drug:  glucose blood USE TO TEST TWICE A DAY   QVAR 80 MCG/ACT inhaler Generic drug:  beclomethasone INHALE 2 PUFFS BY MOUTH INTO THE LUNGS 2 (TWO) TIMES DAILY.   ranitidine 300 MG tablet Commonly known as:  ZANTAC Take 1 tablet (300 mg total) by mouth at bedtime.   SUMAtriptan 100 MG tablet Commonly known as:  IMITREX Take 1tab at earliest onset of headache.  May repeat x1 in 2 hours if headache persists or recurs.   tiaGABine 4 MG tablet Commonly known as:  GABITRIL Take 8 mg by mouth 2 (two) times daily.   trimethoprim-polymyxin b ophthalmic solution Commonly known as:  POLYTRIM Place 2 drops into the left eye every 4 (four) hours.   VENTOLIN HFA 108 (90 Base) MCG/ACT inhaler Generic drug:  albuterol INHALE 2 PUFFS BY MOUTH INTO THE LUNGS EVERY 4 HOURS AS NEEDED FOR WHEEZING OR SHORTNESS OF BREATH       Activity: activity as tolerated Diet: Bariatric protein shakes Wound Care: none needed  Follow-up:  With Dr. Johna SheriffHoxworth in 3 weeks.  Signed: Mariella SaaBenjamin T Litha Lamartina MD, FACS  04/16/2016, 9:53 AM

## 2016-04-16 NOTE — Progress Notes (Signed)
Patient alert and oriented, pain is controlled. Patient is tolerating fluids,  advanced to protein shake today, patient tolerated well. Reviewed Gastric Bypass discharge instructions with patient and patient is able to articulate understanding. Provided information on BELT program, Support Group and [facility] outpatient pharmacy. All questions answered, will continue to monitor.    

## 2016-04-18 ENCOUNTER — Other Ambulatory Visit: Payer: Self-pay | Admitting: *Deleted

## 2016-04-18 ENCOUNTER — Encounter: Payer: Self-pay | Admitting: *Deleted

## 2016-04-18 NOTE — Patient Outreach (Addendum)
[facility]) Care Management  04/18/2016  [name] [name] Jul 25, 1970 [id number]  Subjective: Telephone call to patient's home number, spoke with patient, and HIPAA verified.  [facility]ed [facility]MR Transition of care follow up,  Link to Wellness program, voiced understanding, is in agreement to complete follow up and to be contacted regarding Link to Wellness appointment.    Patient states she is doing well and trying to follow all of her discharge instructions. States she has a follow up appointment with primary MD on 05/23/16 and MD did not want to see her earlier unless there was an urgent issue.  States she has a follow up appointment with nutrition class on 04/29/16 and surgery follow up appointment on 05/01/16.   Patient states she has been approved for family medical leave act (FMLA), will contact [facility] benefit specialist regarding verification of supplemental insurance, and file claim if appropriate.  [facility] and is able to afford medication.  Patient states she does not have any transition of care, care coordination, transportation, community resource, or pharmacy needs at this time.  Patient very appreciative of the follow up call and states she is looking forward to hearing from [facility] regarding scheduling of initial appointment.    Objective: Per chart review: Patient hospitalized 04/14/16 - 04/16/16 for morbid obesity.  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  Patient had ED visit on 08/16/15 for leg pain. Patient also has a history of diabetes, hypertension, hyperlipidemia, and bipolar disorder.    Assessment: Received UMR Transition of care referral on 04/15/16.    Transition of care follow up completed, no additional telephonic RNCM needs at this time, and will refer to Link to Wellness for follow up.    Plan: RNCM will send Link to Wellness appointment request   for patient via  In-basket and email to Iverson AlaminLaura Greeson at [facility].  RNCM will send patient successful outreach letter, [facility] pamphlet, and magnet.     Shirlean Berman H. Gardiner Barefootooper RN, BSN, CCM [facility] Care [facility] Telephonic CM Phone: [phone] Fax: [phone]

## 2016-04-24 ENCOUNTER — Other Ambulatory Visit: Payer: Self-pay | Admitting: Internal Medicine

## 2016-04-24 MED FILL — LISINOPRIL 20 MG TABLET: 20 | 90 days supply | Qty: 90 | Fill #1

## 2016-04-24 MED FILL — ONE TOUCH ULTRA TEST STRIPS: 50 days supply | Qty: 100 | Fill #1

## 2016-04-24 MED FILL — OMEPRAZOLE DR 40 MG CAPSULE: 40 | 30 days supply | Qty: 60 | Fill #2

## 2016-04-25 ENCOUNTER — Encounter: Payer: Self-pay | Admitting: Internal Medicine

## 2016-04-25 ENCOUNTER — Other Ambulatory Visit: Payer: Self-pay

## 2016-04-25 MED FILL — metFORMIN HCL 1000 MG TABS: 1000 | 30 days supply | Qty: 60 | Fill #0

## 2016-04-29 ENCOUNTER — Encounter: Payer: 59 | Attending: General Surgery | Admitting: Dietician

## 2016-04-29 DIAGNOSIS — Z6841 Body Mass Index (BMI) 40.0 and over, adult: Secondary | ICD-10-CM

## 2016-04-29 DIAGNOSIS — Z713 Dietary counseling and surveillance: Secondary | ICD-10-CM | POA: Insufficient documentation

## 2016-04-30 ENCOUNTER — Encounter: Payer: Self-pay | Admitting: Family Medicine

## 2016-04-30 NOTE — Progress Notes (Signed)
Bariatric Class:  Appt start time: 1530 end time:  1630.  2 Week Post-Operative Nutrition Class  Patient was seen on 04/29/16 for Post-[facility]ative [facility]   Surgery date: 04/14/2016 Surgery type: RYGB Start weight at [facility]: 358 lbs on 02/28/2016 Weight today: 321.2 lbs  Weight change: 37.6 lbs  TANITA  BODY COMP RESULTS  03/31/16 04/29/16   BMI (kg/m^2) 53 47.4   Fat Mass (lbs) 192.2 185.6   Fat Free Mass (lbs) 166.2 135.6   Total Body Water (lbs) 125.4 102.2   The following the learning objectives were met by the patient during this course:  Identifies Phase 3A (Soft, High Proteins) Dietary Goals and will begin from 2 weeks post-operatively to 2 months post-operatively  Identifies appropriate sources of fluids and proteins   States protein recommendations and appropriate sources post-operatively  Identifies the need for appropriate texture modifications, mastication, and bite sizes when consuming solids  Identifies appropriate multivitamin and calcium sources post-operatively  Describes the need for physical activity post-operatively and will follow MD recommendations  States when to call healthcare provider regarding medication questions or post-operative complications  Handouts given during class include:  Phase 3A: Soft, High Protein Diet Handout  Follow-Up Plan: Patient will follow-up at [facility] in 6 weeks for 2 month post-op nutrition visit for diet advancement per MD.

## 2016-05-07 MED FILL — TEGRETOL XR 200 MG TABLET: 200 | 30 days supply | Qty: 90 | Fill #0

## 2016-05-07 MED FILL — QVAR 80 MCG ORAL INHALER: 80 | 30 days supply | Qty: 9 | Fill #4

## 2016-05-07 MED FILL — lamoTRIgine 200 MG TABS: 200 | 30 days supply | Qty: 60 | Fill #0

## 2016-05-08 ENCOUNTER — Telehealth: Payer: 59 | Admitting: Physician Assistant

## 2016-05-08 DIAGNOSIS — R1032 Left lower quadrant pain: Secondary | ICD-10-CM

## 2016-05-08 DIAGNOSIS — R3911 Hesitancy of micturition: Secondary | ICD-10-CM

## 2016-05-08 NOTE — Progress Notes (Signed)
Based on what you shared with me it looks like you have a serious condition that should be evaluated in a face to face office visit. You are mentioning some UTI symptoms but the left lower abdominal pain is concerning giving it radiates around to the back. I would recommend you be seen today to r/o any other abdominal cause of this pain. Please schedule an appointment with Dr. Beverely Lowabori or one of her colleagues.    NOTE: Even if you have entered your credit card information for this eVisit, you will not be charged.   If you are having a true medical emergency please call 911.  If you need an urgent face to face visit, [place] has four urgent care centers for your convenience.  If you need care fast and have a high deductible or no insurance consider:   [email or web address]  [phone]  [address], [address] [place], [place] [address] 8 am to 8 pm Monday-Friday 10 am to 4 pm Saturday-Sunday   The following sites will take your  insurance:    . [facility]  [phone] Get Driving Directions Find a Provider at this Location  [address] [place], [place] [address] . 10 am to 8 pm Monday-Friday . 12 pm to 8 pm Saturday-Sunday   . [facility] [place]  [phone] Get Driving Directions Find a Provider at this Location  [address] [place], [place] [address] . 8 am to 8 pm Monday-Friday . 9 am to 6 pm Saturday . 11 am to 6 pm Sunday   . [facility] [place]  [phone] Get Driving Directions  [address] [place], [place] [address] . 8 am to 8 pm Monday-Friday . 8 am to 4 pm Saturday-Sunday   . [facility] (walk-ins welcome, or call for a scheduled time)  [phone]  Get Driving Directions Find a Provider at this Location  [address] [place], [place] [address] . 8 am to 8:30 pm Monday-Thursday . 8 am to 6 pm Friday . 8  am to 4 pm Saturday-Sunday   Your e-visit answers were reviewed by a board certified advanced clinical practitioner to complete your personal care plan.  Thank you for using e-Visits.

## 2016-05-09 ENCOUNTER — Encounter: Payer: Self-pay | Admitting: Family Medicine

## 2016-05-09 ENCOUNTER — Ambulatory Visit (INDEPENDENT_AMBULATORY_CARE_PROVIDER_SITE_OTHER): Payer: 59 | Admitting: Family Medicine

## 2016-05-09 VITALS — BP 120/81 | HR 78 | Temp 98.0°F | Resp 16 | Ht 69.0 in | Wt 319.4 lb

## 2016-05-09 DIAGNOSIS — R300 Dysuria: Secondary | ICD-10-CM | POA: Diagnosis not present

## 2016-05-09 DIAGNOSIS — R1032 Left lower quadrant pain: Secondary | ICD-10-CM | POA: Diagnosis not present

## 2016-05-09 LAB — POCT URINALYSIS DIPSTICK
BILIRUBIN UA: NEGATIVE
Glucose, UA: NEGATIVE
Ketones, UA: 1.5
Leukocytes, UA: NEGATIVE
NITRITE UA: NEGATIVE
PH UA: 5.5
RBC UA: NEGATIVE
Spec Grav, UA: >=1.03
UROBILINOGEN UA: 0.2

## 2016-05-09 NOTE — Patient Instructions (Signed)
Follow up as needed We'll let you know about your urine culture results and make any changes if needed Increase your fluid intake but stay within your restrictions If the inguinal pain continues, changes, or worsens- please let me or Dr Johna SheriffHoxworth know as this may be related to a hernia Call with any questions or concerns Have a good weekend! Happy Holidays!

## 2016-05-09 NOTE — Progress Notes (Signed)
   Subjective:    Patient ID: [name], female    DOB: [birth year], 45 y.o.   MRN: [id number]  HPI Dysuria- having some burning w/ urination but not w/ each void.  Urine is dark in color.  Pt felt it was bc she is unable to drink as much since bariatric surgery but even w/ increased fluids, urine remains dark.  No fevers.    L inguinal pain- sxs started when she had bariatric surgery.  Yesterday sxs worsened.  No radiation of pain.  No change w/ movement.  Dr Johna SheriffHoxworth told her to f/u here for possible relation to UTI.     Review of Systems For ROS see HPI     Objective:   Physical Exam  Constitutional: She is oriented to person, place, and time. She appears well-developed and well-nourished. No distress.  obese  HENT:  Head: Normocephalic and atraumatic.  Abdominal: Soft. Bowel sounds are normal. She exhibits no distension. There is no tenderness. There is no rebound and no guarding.  Mild TTP when standing under pannus in L inguinal area.  No palpable hernia or obvious defect.  Neurological: She is alert and oriented to person, place, and time.  Skin: Skin is warm and dry.  Psychiatric: She has a normal mood and affect. Her behavior is normal. Thought content normal.  Vitals reviewed.         Assessment & Plan:  Dysuria- I suspect that her sxs are due to concentrated urine based on her fluid restrictions after bariatric surgery.  No obvious abnormalities on UA.  Will send urine for culture and await results prior to starting abx.  Pt expressed understanding and is in agreement w/ plan.   L inguinal pain- reviewed that pt's body is changing after her bariatric surgery and this could be ligamentous or other musculoskeletal pain due to the pull of the pannus or there is the possibility of hernia.  Reviewed supportive care and red flags that should prompt return.  Pt expressed understanding and is in agreement w/ plan.

## 2016-05-09 NOTE — Progress Notes (Signed)
Pre visit review using our clinic review tool, if applicable. No additional management support is needed unless otherwise documented below in the visit note. 

## 2016-05-11 LAB — URINE CULTURE

## 2016-05-23 ENCOUNTER — Encounter: Payer: Self-pay | Admitting: Family Medicine

## 2016-05-23 ENCOUNTER — Ambulatory Visit (INDEPENDENT_AMBULATORY_CARE_PROVIDER_SITE_OTHER): Payer: 59 | Admitting: Family Medicine

## 2016-05-23 VITALS — BP 122/78 | HR 80 | Temp 98.7°F | Resp 16 | Ht 69.0 in | Wt 313.2 lb

## 2016-05-23 DIAGNOSIS — E782 Mixed hyperlipidemia: Secondary | ICD-10-CM

## 2016-05-23 DIAGNOSIS — I10 Essential (primary) hypertension: Secondary | ICD-10-CM | POA: Diagnosis not present

## 2016-05-23 LAB — LIPID PANEL
Cholesterol: 163 mg/dL (ref 0–200)
HDL: 48.5 mg/dL (ref >39.00)
LDL Cholesterol: 85 mg/dL (ref 0–99)
NonHDL: 114.59
Total CHOL/HDL Ratio: 3
Triglycerides: 146 mg/dL (ref 0.0–149.0)
VLDL: 29.2 mg/dL (ref 0.0–40.0)

## 2016-05-23 LAB — BASIC METABOLIC PANEL WITH GFR
BUN: 12 mg/dL (ref 6–23)
CO2: 29 meq/L (ref 19–32)
Calcium: 9.5 mg/dL (ref 8.4–10.5)
Chloride: 103 meq/L (ref 96–112)
Creatinine, Ser: 0.56 mg/dL (ref 0.40–1.20)
GFR: 124.7 mL/min (ref >60.00)
Glucose, Bld: 134 mg/dL — ABNORMAL HIGH (ref 70–99)
Potassium: 4.1 meq/L (ref 3.5–5.1)
Sodium: 142 meq/L (ref 135–145)

## 2016-05-23 LAB — HEPATIC FUNCTION PANEL
ALT: 35 U/L (ref 0–35)
AST: 36 U/L (ref 0–37)
Albumin: 4.3 g/dL (ref 3.5–5.2)
Alkaline Phosphatase: 56 U/L (ref 39–117)
BILIRUBIN DIRECT: 0.1 mg/dL (ref 0.0–0.3)
BILIRUBIN TOTAL: 0.3 mg/dL (ref 0.2–1.2)
Total Protein: 6.8 g/dL (ref 6.0–8.3)

## 2016-05-23 LAB — CBC WITH DIFFERENTIAL/PLATELET
Basophils Absolute: 0 K/uL (ref 0.0–0.1)
Basophils Relative: 0.5 % (ref 0.0–3.0)
Eosinophils Absolute: 0.4 K/uL (ref 0.0–0.7)
Eosinophils Relative: 5.1 % — ABNORMAL HIGH (ref 0.0–5.0)
HCT: 41.9 % (ref 36.0–46.0)
Hemoglobin: 14 g/dL (ref 12.0–15.0)
Lymphocytes Relative: 24.1 % (ref 12.0–46.0)
Lymphs Abs: 1.7 K/uL (ref 0.7–4.0)
MCHC: 33.4 g/dL (ref 30.0–36.0)
MCV: 86.6 fl (ref 78.0–100.0)
Monocytes Absolute: 0.5 K/uL (ref 0.1–1.0)
Monocytes Relative: 7.3 % (ref 3.0–12.0)
Neutro Abs: 4.4 K/uL (ref 1.4–7.7)
Neutrophils Relative %: 63 % (ref 43.0–77.0)
Platelets: 295 K/uL (ref 150.0–400.0)
RBC: 4.84 Mil/uL (ref 3.87–5.11)
RDW: 14 % (ref 11.5–15.5)
WBC: 7 K/uL (ref 4.0–10.5)

## 2016-05-23 MED FILL — raNITIdine HCL 300 MG TABS: 300 | 90 days supply | Qty: 90 | Fill #3

## 2016-05-23 MED FILL — metFORMIN HCL 1000 MG TABS: 1000 | 30 days supply | Qty: 60 | Fill #1

## 2016-05-23 MED FILL — GABAPENTIN 600 MG TABLET: 600 | 30 days supply | Qty: 60 | Fill #1

## 2016-05-23 MED FILL — OMEPRAZOLE DR 40 MG CAPSULE: 40 | 30 days supply | Qty: 60 | Fill #3

## 2016-05-23 NOTE — Assessment & Plan Note (Signed)
Chronic problem.  Well controlled since decreasingly Lisinopril to 10mg  daily and stopping HCTZ after surgery.  Check labs.  No anticipated med changes.  Will follow.

## 2016-05-23 NOTE — Progress Notes (Signed)
   Subjective:    Patient ID: [name], female    DOB: 1971-01-23, 45 y.o.   MRN: [id number]  HPI HTN- chronic problem, on Lisinopril 10mg  and Coreg 25mg  BID since bariatric surgery.  She stopped her HCTZ.  Well controlled today.  Pt is down another 6 lbs.  Denies CP, SOB, HAs, visual changes, edema.  Hyperlipidemia- chronic problem, on Fenofibrate and Lipitor.  Denies abd pain, N/V.   Review of Systems For ROS see HPI     Objective:   Physical Exam  Constitutional: She is oriented to person, place, and time. She appears well-developed and well-nourished. No distress.  obese  HENT:  Head: Normocephalic and atraumatic.  Eyes: Conjunctivae and EOM are normal. Pupils are equal, round, and reactive to light.  Neck: Normal range of motion. Neck supple. No thyromegaly present.  Cardiovascular: Normal rate, regular rhythm, normal heart sounds and intact distal pulses.   No murmur heard. Pulmonary/Chest: Effort normal and breath sounds normal. No respiratory distress.  Abdominal: Soft. She exhibits no distension. There is no tenderness.  Musculoskeletal: She exhibits no edema.  Lymphadenopathy:    She has no cervical adenopathy.  Neurological: She is alert and oriented to person, place, and time.  Skin: Skin is warm and dry.  Psychiatric: She has a normal mood and affect. Her behavior is normal.  Vitals reviewed.         Assessment & Plan:

## 2016-05-23 NOTE — Progress Notes (Signed)
Pre visit review using our clinic review tool, if applicable. No additional management support is needed unless otherwise documented below in the visit note. 

## 2016-05-23 NOTE — Patient Instructions (Signed)
Schedule your complete physical for March We'll notify you of your lab results and make any changes if needed Keep up the good work on healthy diet and regular exercise- you look great!!! Call with any questions or concerns Happy New Year!!!

## 2016-05-23 NOTE — Assessment & Plan Note (Signed)
Chronic problem.  Tolerating statin and fenofibrate w/o difficulty.  Pt is excited about the possibility of decreasing or stopping meds as she continues to lose weight after bariatric surgery.  Check labs.  Adjust meds prn

## 2016-06-12 ENCOUNTER — Ambulatory Visit: Payer: Self-pay | Admitting: Dietician

## 2016-06-13 ENCOUNTER — Encounter: Payer: Self-pay | Admitting: Family Medicine

## 2016-06-13 ENCOUNTER — Ambulatory Visit (INDEPENDENT_AMBULATORY_CARE_PROVIDER_SITE_OTHER): Payer: 59 | Admitting: Family Medicine

## 2016-06-13 VITALS — BP 106/70 | HR 82 | Temp 97.8°F | Wt 304.6 lb

## 2016-06-13 DIAGNOSIS — E11649 Type 2 diabetes mellitus with hypoglycemia without coma: Secondary | ICD-10-CM

## 2016-06-13 DIAGNOSIS — R031 Nonspecific low blood-pressure reading: Secondary | ICD-10-CM | POA: Diagnosis not present

## 2016-06-13 MED ORDER — INSULIN GLARGINE 100 UNIT/ML SOLOSTAR PEN: 25.0000 [IU] | PEN_INJECTOR | Freq: Every day | SUBCUTANEOUS | 3 refills | Status: DC

## 2016-06-13 MED FILL — lamoTRIgine 200 MG TABS: 200 | 30 days supply | Qty: 60 | Fill #1

## 2016-06-13 MED FILL — CARVEDILOL 25 MG TABLET: 25 | 90 days supply | Qty: 180 | Fill #1

## 2016-06-13 MED FILL — TEGRETOL XR 200 MG TABLET: 200 | 30 days supply | Qty: 90 | Fill #1

## 2016-06-13 NOTE — Assessment & Plan Note (Addendum)
s/p gastric bypass: 65 pounds lost, 06/13/2016

## 2016-06-13 NOTE — Progress Notes (Signed)
[name] is a 46 y.o. female here for a new problem.  History of Present Illness:   S/p gastric bypass. Now, down 62 pounds. Estimates 1 pounds/day weight loss.   1. Hypoglycemia: Down to 30 last night after taking Lantus 30. Felt "bad" and checked. Ate Pb, yogurt, and drank milk. Up to 140 after 30 minutes. Usually takes Lantus 27-30 units q hs, depending on Bs.   2. Low Blood Pressure: Decreasing consistently. Still taking Lisinopril and Coreg. HR sometimes 50s-60s. Tired. No edema, dizziness, CP.  PMHx, SurgHx, SocialHx, Medications, and Allergies were reviewed in the Visit Navigator and updated as appropriate.  Current Medications:   Current Outpatient Prescriptions:  .  acetaminophen (TYLENOL) 500 MG tablet, Take 1,000 mg by mouth every 6 (six) hours as needed for mild pain., Disp: , Rfl:  .  ARIPiprazole (ABILIFY) 5 MG tablet, Take 1 tablet by mouth daily., Disp: , Rfl: 2 .  atorvastatin (LIPITOR) 40 MG tablet, TAKE 1 TABLET BY MOUTH DAILY, Disp: 90 tablet, Rfl: 1 .  B-D UF III MINI PEN NEEDLES 31G X 5 MM MISC, USE TO INJECT INSULIN 2 TIMES DAILY, Disp: 180 each, Rfl: 3 .  carbamazepine (TEGRETOL XR) 200 MG 12 hr tablet, 1 tablet in the morning, 2 at night, Disp: , Rfl:  .  carvedilol (COREG) 25 MG tablet, TAKE 1 TABLET BY MOUTH 2 TIMES DAILY., Disp: 180 tablet, Rfl: 1 .  clobetasol ointment (TEMOVATE) 0.05 %, Apply 1 application topically 2 (two) times daily. Do not use for more than 7 days, Disp: 60 g, Rfl: 1 .  fenofibrate 160 MG tablet, TAKE 1 TABLET BY MOUTH DAILY, Disp: 90 tablet, Rfl: 1 .  fexofenadine (ALLEGRA) 180 MG tablet, Take 180 mg by mouth daily. supper, Disp: , Rfl:  .  fluticasone (FLONASE) 50 MCG/ACT nasal spray, Place 2 sprays into both nostrils daily. (Patient taking differently: Place 2 sprays into both nostrils daily as needed for allergies or rhinitis. ), Disp: 16 g, Rfl: 2 .  gabapentin (NEURONTIN) 600 MG tablet, Take 300 mg by mouth 4 (four) times daily.  Patient divids dose up through e day, Disp: , Rfl:  .  Insulin Glargine (LANTUS SOLOSTAR) 100 UNIT/ML Solostar Pen, Inject 30 Units into the skin daily at 10 pm. (Patient taking differently: Inject 27 Units into the skin daily at 10 pm. ), Disp: 30 mL, Rfl: 3 .  lamoTRIgine (LAMICTAL) 200 MG tablet, Take 200 mg by mouth 2 (two) times daily., Disp: , Rfl:  .  Lancets 30G MISC, Use one lancet each time sugars are checked. Pt tests twice daily. DX E11.9, Disp: 100 each, Rfl: 2 .  lisinopril (PRINIVIL,ZESTRIL) 20 MG tablet, TAKE 1 TABLET (20 MG TOTAL) BY MOUTH DAILY. (Patient taking differently: Take 10 mg by mouth daily. supper), Disp: 90 tablet, Rfl: 1 .  metFORMIN (GLUCOPHAGE) 1000 MG tablet, TAKE 1 TABLET (1,000 MG TOTAL) BY MOUTH 2 TIMES DAILY WITH MEAL, Disp: 60 tablet, Rfl: 2 .  omeprazole (PRILOSEC) 40 MG capsule, TAKE 1 CAPSULE BY MOUTH TWICE A DAY, Disp: 60 capsule, Rfl: 5 .  ONE TOUCH ULTRA TEST test strip, USE TO TEST TWICE A DAY, Disp: 100 each, Rfl: 2 .  PRESCRIPTION MEDICATION, Bariatric advantage calcium supplement and multivitamin, Disp: , Rfl:  .  QVAR 80 MCG/ACT inhaler, INHALE 2 PUFFS BY MOUTH INTO THE LUNGS 2 (TWO) TIMES DAILY., Disp: 8.7 g, Rfl: 6 .  ranitidine (ZANTAC) 300 MG tablet, Take 1 tablet (300 mg total)  by mouth at bedtime., Disp: 90 tablet, Rfl: 3 .  tiaGABine (GABITRIL) 4 MG tablet, Take 8 mg by mouth 2 (two) times daily., Disp: , Rfl:  .  VENTOLIN HFA 108 (90 Base) MCG/ACT inhaler, INHALE 2 PUFFS BY MOUTH INTO THE LUNGS EVERY 4 HOURS AS NEEDED FOR WHEEZING OR SHORTNESS OF BREATH, Disp: 18 g, Rfl: 3   Review of Systems:   Constitutional: Negative for fever, chills. HEENT: Negative for ear discharge, ear pain, mouth sores, sore throat, tinnitus and trouble swallowing.  No eye pain. LUNGS: Negative for cough, choking, chest tightness, shortness of breath and wheezing.   CV: Negative for chest pain, palpitations and leg swelling.  GI: Negative for nausea, abdominal  pain, diarrhea, constipation and abdominal distention. No new change in bowel habits. GU: Negative for dysuria, flank pain and difficulty urinating.  MSK:: Negative for unusual  joint swelling or pains, gait problem, neck pain and neck stiffness.  NEURO: Negative for dizziness, tremors, speech difficulty, weakness and headaches. No visual changes. HEME:: Does not bruise/bleed easily.  PSYCH:: Negative for suicidal ideas, hallucinations, sleep disturbance, self-injury, dysphoric mood, decreased concentration and agitation. The patient denies nervous/anxious.     Vitals:   Vitals:   06/13/16 1216  BP: 106/70  Pulse: 82  Temp: 97.8 F (36.6 C)  TempSrc: Oral  SpO2: 95%  Weight: (!) 304 lb 9.6 oz (138.2 kg)     Body mass index is 44.98 kg/m.  Physical Exam:   General: Alert, cooperative, appears stated age and no distress.  HEENT:  Normocephalic, without obvious abnormality, atraumatic. Conjunctivae/corneas clear. PERRL, EOM's intact. Normal TM's and external ear canals both ears. Nares normal. Septum midline. Mucosa normal. No drainage or sinus tenderness. Lips, mucosa, and tongue normal; teeth and gums normal.  Lungs: Clear to auscultation bilaterally.  Heart:: Regular rate and rhythm, S1, S2 normal, no murmur, click, rub or gallop.  Abdomen: Soft, non-tender; bowel sounds normal; no masses,  no organomegaly.  Extremities: Extremities normal, atraumatic, no cyanosis or edema.  Pulses: 2+ and symmetric.  Skin: Skin color, texture, turgor normal. No rashes or lesions.  Neurologic: Alert and oriented X 3, normal strength and tone. Normal symmetric. reflexes. Normal coordination and gait.  Psych: Alert,oriented, in NAD with a full range of affect, normal behavior and no psychotic features    Assessment and Plan:    [name] was seen today for hypoglycemia and hypotension.  Diagnoses and all orders for this visit:  Hypoglycemia associated with type 2 diabetes mellitus  (HCC) Comments: Decrease Lantus to 25 untis q hs. Discussed plan for decreasing further as weight decreases. Reviewed 15-15 rule.  Decreased blood pressure, not hypotension Comments: Hold Coreg. Monitor BP  and HR BID.   OBESITY, MORBID        Comments:              s/p gastric bypass: 65 pounds lost, 06/13/2016  Other orders -     Insulin Glargine (LANTUS SOLOSTAR) 100 UNIT/ML Solostar Pen; Inject 25 Units into the skin daily at 10 pm.  . Reviewed expectations re: course of current medical issues. . Discussed self-management of symptoms. . Outlined signs and symptoms indicating need for more acute intervention. . Patient verbalized understanding and all questions were answered. . See orders for this visit as documented in the electronic medical record. . Patient received an After Visit Summary.   Helane Rima, D.O.   Follow-up: No Follow-up on file.  Meds ordered this encounter  Medications  .  Insulin Glargine (LANTUS SOLOSTAR) 100 UNIT/ML Solostar Pen    Sig: Inject 25 Units into the skin daily at 10 pm.    Dispense:  30 mL    Refill:  3   Medications Discontinued During This Encounter  Medication Reason  . carvedilol (COREG) 25 MG tablet   . Insulin Glargine (LANTUS SOLOSTAR) 100 UNIT/ML Solostar Pen Reorder   No orders of the defined types were placed in this encounter.    Allergies as of 06/13/2016      Reactions   Zofran [ondansetron Hcl] Other (See Comments)   Severe headache      Medication List       Accurate as of 06/13/16  1:40 PM. Always use your most recent med list.          acetaminophen 500 MG tablet Commonly known as:  TYLENOL Take 1,000 mg by mouth every 6 (six) hours as needed for mild pain.   ARIPiprazole 5 MG tablet Commonly known as:  ABILIFY Take 1 tablet by mouth daily.   atorvastatin 40 MG tablet Commonly known as:  LIPITOR TAKE 1 TABLET BY MOUTH DAILY   B-D UF III MINI PEN NEEDLES 31G X 5 MM Misc Generic drug:  Insulin Pen  Needle USE TO INJECT INSULIN 2 TIMES DAILY   carbamazepine 200 MG 12 hr tablet Commonly known as:  TEGRETOL XR 1 tablet in the morning, 2 at night   clobetasol ointment 0.05 % Commonly known as:  TEMOVATE Apply 1 application topically 2 (two) times daily. Do not use for more than 7 days   fenofibrate 160 MG tablet TAKE 1 TABLET BY MOUTH DAILY   fexofenadine 180 MG tablet Commonly known as:  ALLEGRA Take 180 mg by mouth daily. supper   fluticasone 50 MCG/ACT nasal spray Commonly known as:  FLONASE Place 2 sprays into both nostrils daily.   gabapentin 600 MG tablet Commonly known as:  NEURONTIN Take 300 mg by mouth 4 (four) times daily. Patient divids dose up through e day   Insulin Glargine 100 UNIT/ML Solostar Pen Commonly known as:  LANTUS SOLOSTAR Inject 25 Units into the skin daily at 10 pm.   lamoTRIgine 200 MG tablet Commonly known as:  LAMICTAL Take 200 mg by mouth 2 (two) times daily.   Lancets 30G Misc Use one lancet each time sugars are checked. Pt tests twice daily. DX E11.9   lisinopril 20 MG tablet Commonly known as:  PRINIVIL,ZESTRIL TAKE 1 TABLET (20 MG TOTAL) BY MOUTH DAILY.   metFORMIN 1000 MG tablet Commonly known as:  GLUCOPHAGE TAKE 1 TABLET (1,000 MG TOTAL) BY MOUTH 2 TIMES DAILY WITH MEAL   omeprazole 40 MG capsule Commonly known as:  PRILOSEC TAKE 1 CAPSULE BY MOUTH TWICE A DAY   ONE TOUCH ULTRA TEST test strip Generic drug:  glucose blood USE TO TEST TWICE A DAY   PRESCRIPTION MEDICATION Bariatric advantage calcium supplement and multivitamin   QVAR 80 MCG/ACT inhaler Generic drug:  beclomethasone INHALE 2 PUFFS BY MOUTH INTO THE LUNGS 2 (TWO) TIMES DAILY.   ranitidine 300 MG tablet Commonly known as:  ZANTAC Take 1 tablet (300 mg total) by mouth at bedtime.   tiaGABine 4 MG tablet Commonly known as:  GABITRIL Take 8 mg by mouth 2 (two) times daily.   VENTOLIN HFA 108 (90 Base) MCG/ACT inhaler Generic drug:  albuterol INHALE  2 PUFFS BY MOUTH INTO THE LUNGS EVERY 4 HOURS AS NEEDED FOR WHEEZING OR SHORTNESS OF BREATH

## 2016-06-13 NOTE — Progress Notes (Signed)
Pre visit review using our clinic review tool, if applicable. No additional management support is needed unless otherwise documented below in the visit note. 

## 2016-06-15 ENCOUNTER — Encounter: Payer: Self-pay | Admitting: Family Medicine

## 2016-06-16 MED FILL — ARIPiprazole 5 MG TABS: 5 | 30 days supply | Qty: 30 | Fill #0

## 2016-06-17 MED FILL — tiaGABine HCL 4 MG TABS: 4 | 90 days supply | Qty: 360 | Fill #0

## 2016-06-20 MED FILL — QVAR 80 MCG ORAL INHALER: 80 | 30 days supply | Qty: 9 | Fill #5

## 2016-06-20 MED FILL — LANTUS SOLOSTAR 100 UNITS/M: 100 | 90 days supply | Qty: 27 | Fill #0

## 2016-07-01 ENCOUNTER — Ambulatory Visit (INDEPENDENT_AMBULATORY_CARE_PROVIDER_SITE_OTHER): Payer: 59 | Admitting: Physician Assistant

## 2016-07-01 ENCOUNTER — Encounter: Payer: Self-pay | Admitting: Physician Assistant

## 2016-07-01 VITALS — BP 128/80 | HR 73 | Temp 98.3°F | Resp 14 | Ht 69.0 in | Wt 294.0 lb

## 2016-07-01 DIAGNOSIS — R52 Pain, unspecified: Secondary | ICD-10-CM

## 2016-07-01 LAB — POCT INFLUENZA A/B
INFLUENZA A, POC: NEGATIVE
INFLUENZA B, POC: NEGATIVE

## 2016-07-01 NOTE — Progress Notes (Signed)
Pre visit review using our clinic review tool, if applicable. No additional management support is needed unless otherwise documented below in the visit note. 

## 2016-07-01 NOTE — Progress Notes (Signed)
Patient presents to clinic today c/o sudden onset of aches, chills, fatigue starting at 4PM yesterday afternoon. Notes ear pressure/pain bilateral. Denies cough or congestion. Notes a runny nose and scratchy throat. Denies known fever. 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 Patient has taken Tylenol for aches. Has not taken this morning.   Past Medical History:  Diagnosis Date  . Allergy   . Asthma    pt stated treated as ashtma but not really asthma  . Bipolar disorder ([place])   . Chicken pox   . Depression   . Diabetes mellitus   . GERD (gastroesophageal reflux disease)   . Hyperlipidemia   . Hypertension    readings  . Irregular heartbeat   . Migraine   . Sleep apnea     Current Outpatient Prescriptions on File Prior to Visit  Medication Sig Dispense Refill  . acetaminophen (TYLENOL) 500 MG tablet Take 1,000 mg by mouth every 6 (six) hours as needed for mild pain.    . ARIPiprazole (ABILIFY) 5 MG tablet Take 1 tablet by mouth daily.  2  . atorvastatin (LIPITOR) 40 MG tablet TAKE 1 TABLET BY MOUTH DAILY 90 tablet 1  . B-D UF III MINI PEN NEEDLES 31G X 5 MM MISC USE TO INJECT INSULIN 2 TIMES DAILY 180 each 3  . carbamazepine (TEGRETOL XR) 200 MG 12 hr tablet 1 tablet in the morning, 2 at night    . fenofibrate 160 MG tablet TAKE 1 TABLET BY MOUTH DAILY 90 tablet 1  . fexofenadine (ALLEGRA) 180 MG tablet Take 180 mg by mouth daily. supper    . fluticasone (FLONASE) 50 MCG/ACT nasal spray Place 2 sprays into both nostrils daily. (Patient taking differently: Place 2 sprays into both nostrils daily as needed for allergies or rhinitis. ) 16 g 2  . gabapentin (NEURONTIN) 600 MG tablet Take 300 mg by mouth 4 (four) times daily. Patient divids dose up through e day    . Insulin Glargine (LANTUS SOLOSTAR) 100 UNIT/ML Solostar Pen Inject 25 Units into the skin daily at 10 pm. 30 mL 3  . lamoTRIgine (LAMICTAL) 200 MG tablet Take 200 mg by mouth 2  (two) times daily.    . Lancets 30G MISC Use one lancet each time sugars are checked. Pt tests twice daily. DX E11.9 100 each 2  . lisinopril (PRINIVIL,ZESTRIL) 20 MG tablet TAKE 1 TABLET (20 MG TOTAL) BY MOUTH DAILY. (Patient taking differently: Take 10 mg by mouth daily. supper) 90 tablet 1  . metFORMIN (GLUCOPHAGE) 1000 MG tablet TAKE 1 TABLET (1,000 MG TOTAL) BY MOUTH 2 TIMES DAILY WITH MEAL 60 tablet 2  . omeprazole (PRILOSEC) 40 MG capsule TAKE 1 CAPSULE BY MOUTH TWICE A DAY 60 capsule 5  . ONE TOUCH ULTRA TEST test strip USE TO TEST TWICE A DAY 100 each 2  . PRESCRIPTION MEDICATION Bariatric advantage calcium supplement and multivitamin    . QVAR 80 MCG/ACT inhaler INHALE 2 PUFFS BY MOUTH INTO THE LUNGS 2 (TWO) TIMES DAILY. 8.7 g 6  . ranitidine (ZANTAC) 300 MG tablet Take 1 tablet (300 mg total) by mouth at bedtime. 90 tablet 3  . tiaGABine (GABITRIL) 4 MG tablet Take 8 mg by mouth 2 (two) times daily.    . VENTOLIN HFA 108 (90 Base) MCG/ACT inhaler INHALE 2 PUFFS BY MOUTH INTO THE LUNGS EVERY 4 HOURS AS NEEDED FOR WHEEZING OR SHORTNESS OF BREATH 18 g 3  No current facility-administered medications on file prior to visit.     Allergies  Allergen Reactions  . Zofran [Ondansetron Hcl] Other (See Comments)    Severe headache    Family History  Problem Relation Age of Onset  . Hyperlipidemia Mother   . Diabetes Mother   . Anxiety disorder Mother   . Heart disease Mother   . Hypertension Father   . Lupus Father   . Heart disease Father   . Stroke Maternal Aunt   . Stroke Paternal Grandfather   . Mental illness Paternal Grandfather     Social History   Social History  . Marital status: Married    Spouse name: N/A  . Number of children: 1  . Years of education: N/A   Social History Main Topics  . Smoking status: Never Smoker  . Smokeless tobacco: Never Used  . Alcohol use No  . Drug use: No  . Sexual activity: Yes    Partners: Male   Other Topics Concern  . None     Social History Narrative  . None   Review of Systems - See HPI.  All other ROS are negative.  BP 128/80   Pulse 73   Temp 98.3 F (36.8 C) (Oral)   Resp 14   Ht '5\' 9"'  (1.753 m)   Wt 294 lb (133.4 kg)   LMP 01/25/2008   SpO2 96%   BMI 43.42 kg/m   Physical Exam  Constitutional: She is oriented to person, place, and time and well-developed, well-nourished, and in no distress.  HENT:  Head: Normocephalic and atraumatic.  Right Ear: External ear normal.  Left Ear: External ear normal.  Nose: Nose normal.  Mouth/Throat: Oropharynx is clear and moist. No oropharyngeal exudate.  TM within normal limits  Eyes: Conjunctivae are normal.  Neck: Neck supple.  Cardiovascular: Normal rate, regular rhythm, normal heart sounds and intact distal pulses.   Pulmonary/Chest: Effort normal and breath sounds normal. No respiratory distress. She has no wheezes. She has no rales. She exhibits no tenderness.  Neurological: She is alert and oriented to person, place, and time.  Skin: Skin is warm and dry. No rash noted.  Psychiatric: Affect normal.  Vitals reviewed.  Recent Results (from the past 2160 hour(s))  Glucose, capillary     Status: Abnormal   Collection Time: 04/04/16  2:09 PM  Result Value Ref Range   Glucose-Capillary 185 (H) 65 - 99 mg/dL  CBC WITH DIFFERENTIAL     Status: None   Collection Time: 04/04/16  3:14 PM  Result Value Ref Range   WBC 7.8 4.0 - 10.5 K/uL   RBC 4.68 3.87 - 5.11 MIL/uL   Hemoglobin 13.6 12.0 - 15.0 g/dL   HCT 39.6 36.0 - 46.0 %   MCV 84.6 78.0 - 100.0 fL   MCH 29.1 26.0 - 34.0 pg   MCHC 34.3 30.0 - 36.0 g/dL   RDW 12.7 11.5 - 15.5 %   Platelets 289 150 - 400 K/uL   Neutrophils Relative % 55 %   Neutro Abs 4.3 1.7 - 7.7 K/uL   Lymphocytes Relative 32 %   Lymphs Abs 2.5 0.7 - 4.0 K/uL   Monocytes Relative 9 %   Monocytes Absolute 0.7 0.1 - 1.0 K/uL   Eosinophils Relative 4 %   Eosinophils Absolute 0.3 0.0 - 0.7 K/uL   Basophils Relative 0 %    Basophils Absolute 0.0 0.0 - 0.1 K/uL  Comprehensive metabolic panel     Status:  Abnormal   Collection Time: 04/04/16  3:14 PM  Result Value Ref Range   Sodium 136 135 - 145 mmol/L   Potassium 3.9 3.5 - 5.1 mmol/L   Chloride 98 (L) 101 - 111 mmol/L   CO2 27 22 - 32 mmol/L   Glucose, Bld 177 (H) 65 - 99 mg/dL   BUN 27 (H) 6 - 20 mg/dL   Creatinine, Ser 0.71 0.44 - 1.00 mg/dL   Calcium 10.3 8.9 - 10.3 mg/dL   Total Protein 7.6 6.5 - 8.1 g/dL   Albumin 4.0 3.5 - 5.0 g/dL   AST 42 (H) 15 - 41 U/L   ALT 35 14 - 54 U/L   Alkaline Phosphatase 53 38 - 126 U/L   Total Bilirubin 0.6 0.3 - 1.2 mg/dL   GFR calc non Af Amer >60 >60 mL/min   GFR calc Af Amer >60 >60 mL/min    Comment: (NOTE) The eGFR has been calculated using the CKD EPI equation. This calculation has not been validated in all clinical situations. eGFR's persistently <60 mL/min signify possible Chronic Kidney Disease.    Anion gap 11 5 - 15  Glucose, capillary     Status: Abnormal   Collection Time: 04/14/16  9:11 AM  Result Value Ref Range   Glucose-Capillary 177 (H) 65 - 99 mg/dL   Comment 1 Notify RN   Glucose, capillary     Status: Abnormal   Collection Time: 04/14/16  3:00 PM  Result Value Ref Range   Glucose-Capillary 261 (H) 65 - 99 mg/dL  Glucose, capillary     Status: Abnormal   Collection Time: 04/14/16  4:55 PM  Result Value Ref Range   Glucose-Capillary 286 (H) 65 - 99 mg/dL  Glucose, capillary     Status: Abnormal   Collection Time: 04/14/16  7:28 PM  Result Value Ref Range   Glucose-Capillary 240 (H) 65 - 99 mg/dL  Hemoglobin and hematocrit, blood     Status: None   Collection Time: 04/14/16  8:03 PM  Result Value Ref Range   Hemoglobin 12.9 12.0 - 15.0 g/dL   HCT 39.4 36.0 - 46.0 %  Glucose, capillary     Status: Abnormal   Collection Time: 04/14/16 11:34 PM  Result Value Ref Range   Glucose-Capillary 169 (H) 65 - 99 mg/dL  Glucose, capillary     Status: Abnormal   Collection Time: 04/15/16   3:50 AM  Result Value Ref Range   Glucose-Capillary 137 (H) 65 - 99 mg/dL  CBC WITH DIFFERENTIAL     Status: None   Collection Time: 04/15/16  4:40 AM  Result Value Ref Range   WBC 8.0 4.0 - 10.5 K/uL   RBC 4.39 3.87 - 5.11 MIL/uL   Hemoglobin 12.8 12.0 - 15.0 g/dL   HCT 38.8 36.0 - 46.0 %   MCV 88.4 78.0 - 100.0 fL   MCH 29.2 26.0 - 34.0 pg   MCHC 33.0 30.0 - 36.0 g/dL   RDW 13.0 11.5 - 15.5 %   Platelets 268 150 - 400 K/uL   Neutrophils Relative % 66 %   Neutro Abs 5.3 1.7 - 7.7 K/uL   Lymphocytes Relative 24 %   Lymphs Abs 1.9 0.7 - 4.0 K/uL   Monocytes Relative 9 %   Monocytes Absolute 0.7 0.1 - 1.0 K/uL   Eosinophils Relative 1 %   Eosinophils Absolute 0.1 0.0 - 0.7 K/uL   Basophils Relative 0 %   Basophils Absolute 0.0 0.0 - 0.1  K/uL  Glucose, capillary     Status: Abnormal   Collection Time: 04/15/16  7:22 AM  Result Value Ref Range   Glucose-Capillary 162 (H) 65 - 99 mg/dL  Glucose, capillary     Status: Abnormal   Collection Time: 04/15/16 12:02 PM  Result Value Ref Range   Glucose-Capillary 145 (H) 65 - 99 mg/dL  Hemoglobin and hematocrit, blood     Status: None   Collection Time: 04/15/16  4:20 PM  Result Value Ref Range   Hemoglobin 12.5 12.0 - 15.0 g/dL   HCT 38.0 36.0 - 46.0 %  Glucose, capillary     Status: Abnormal   Collection Time: 04/15/16  4:48 PM  Result Value Ref Range   Glucose-Capillary 156 (H) 65 - 99 mg/dL  Glucose, capillary     Status: Abnormal   Collection Time: 04/15/16  7:50 PM  Result Value Ref Range   Glucose-Capillary 165 (H) 65 - 99 mg/dL  Glucose, capillary     Status: Abnormal   Collection Time: 04/16/16 12:16 AM  Result Value Ref Range   Glucose-Capillary 124 (H) 65 - 99 mg/dL  Glucose, capillary     Status: Abnormal   Collection Time: 04/16/16  4:13 AM  Result Value Ref Range   Glucose-Capillary 177 (H) 65 - 99 mg/dL  CBC with Differential     Status: None   Collection Time: 04/16/16  4:44 AM  Result Value Ref Range   WBC  7.8 4.0 - 10.5 K/uL   RBC 4.17 3.87 - 5.11 MIL/uL   Hemoglobin 12.1 12.0 - 15.0 g/dL   HCT 36.9 36.0 - 46.0 %   MCV 88.5 78.0 - 100.0 fL   MCH 29.0 26.0 - 34.0 pg   MCHC 32.8 30.0 - 36.0 g/dL   RDW 13.2 11.5 - 15.5 %   Platelets 226 150 - 400 K/uL   Neutrophils Relative % 61 %   Neutro Abs 4.7 1.7 - 7.7 K/uL   Lymphocytes Relative 24 %   Lymphs Abs 1.9 0.7 - 4.0 K/uL   Monocytes Relative 11 %   Monocytes Absolute 0.9 0.1 - 1.0 K/uL   Eosinophils Relative 4 %   Eosinophils Absolute 0.3 0.0 - 0.7 K/uL   Basophils Relative 0 %   Basophils Absolute 0.0 0.0 - 0.1 K/uL  Glucose, capillary     Status: Abnormal   Collection Time: 04/16/16  7:54 AM  Result Value Ref Range   Glucose-Capillary 149 (H) 65 - 99 mg/dL  Glucose, capillary     Status: Abnormal   Collection Time: 04/16/16 12:20 PM  Result Value Ref Range   Glucose-Capillary 137 (H) 65 - 99 mg/dL  POCT urinalysis dipstick     Status: Abnormal   Collection Time: 05/09/16 10:55 AM  Result Value Ref Range   Color, UA yellow    Clarity, UA cloudy    Glucose, UA negative    Bilirubin, UA negative    Ketones, UA 1.5 1+    Spec Grav, UA >=1.030    Blood, UA negative    pH, UA 5.5    Protein, UA 0.3 1+    Urobilinogen, UA 0.2    Nitrite, UA negative    Leukocytes, UA Negative Negative  Urine culture     Status: None   Collection Time: 05/09/16 11:09 AM  Result Value Ref Range   Organism ID, Bacteria      Three or more organisms present,each greater than 10,000 CFU/mL.These organisms,commonly found on external and  internal genitalia,are considered to be colonizers.No further testing performed.   Lipid panel     Status: None   Collection Time: 05/23/16 10:57 AM  Result Value Ref Range   Cholesterol 163 0 - 200 mg/dL    Comment: ATP III Classification       Desirable:  < 200 mg/dL               Borderline High:  200 - 239 mg/dL          High:  > = 240 mg/dL   Triglycerides 146.0 0.0 - 149.0 mg/dL    Comment: Normal:  <150  mg/dLBorderline High:  150 - 199 mg/dL   HDL 48.50 >39.00 mg/dL   VLDL 29.2 0.0 - 40.0 mg/dL   LDL Cholesterol 85 0 - 99 mg/dL   Total CHOL/HDL Ratio 3     Comment:                Men          Women1/2 Average Risk     3.4          3.3Average Risk          5.0          4.42X Average Risk          9.6          7.13X Average Risk          15.0          11.0                       NonHDL 114.59     Comment: NOTE:  Non-HDL goal should be 30 mg/dL higher than patient's LDL goal (i.e. LDL goal of < 70 mg/dL, would have non-HDL goal of < 100 mg/dL)  Basic metabolic panel     Status: Abnormal   Collection Time: 05/23/16 10:57 AM  Result Value Ref Range   Sodium 142 135 - 145 mEq/L   Potassium 4.1 3.5 - 5.1 mEq/L   Chloride 103 96 - 112 mEq/L   CO2 29 19 - 32 mEq/L   Glucose, Bld 134 (H) 70 - 99 mg/dL   BUN 12 6 - 23 mg/dL   Creatinine, Ser 0.56 0.40 - 1.20 mg/dL   Calcium 9.5 8.4 - 10.5 mg/dL   GFR 124.70 >60.00 mL/min  Hepatic function panel     Status: None   Collection Time: 05/23/16 10:57 AM  Result Value Ref Range   Total Bilirubin 0.3 0.2 - 1.2 mg/dL   Bilirubin, Direct 0.1 0.0 - 0.3 mg/dL   Alkaline Phosphatase 56 39 - 117 U/L   AST 36 0 - 37 U/L   ALT 35 0 - 35 U/L   Total Protein 6.8 6.0 - 8.3 g/dL   Albumin 4.3 3.5 - 5.2 g/dL  CBC with Differential/Platelet     Status: Abnormal   Collection Time: 05/23/16 10:57 AM  Result Value Ref Range   WBC 7.0 4.0 - 10.5 K/uL   RBC 4.84 3.87 - 5.11 Mil/uL   Hemoglobin 14.0 12.0 - 15.0 g/dL   HCT 41.9 36.0 - 46.0 %   MCV 86.6 78.0 - 100.0 fl   MCHC 33.4 30.0 - 36.0 g/dL   RDW 14.0 11.5 - 15.5 %   Platelets 295.0 150.0 - 400.0 K/uL   Neutrophils Relative % 63.0 43.0 - 77.0 %   Lymphocytes Relative 24.1 12.0 - 46.0 %   Monocytes  Relative 7.3 3.0 - 12.0 %   Eosinophils Relative 5.1 (H) 0.0 - 5.0 %   Basophils Relative 0.5 0.0 - 3.0 %   Neutro Abs 4.4 1.4 - 7.7 K/uL   Lymphs Abs 1.7 0.7 - 4.0 K/uL   Monocytes Absolute 0.5 0.1 - 1.0  K/uL   Eosinophils Absolute 0.4 0.0 - 0.7 K/uL   Basophils Absolute 0.0 0.0 - 0.1 K/uL   Assessment/Plan: 1. Body aches 1 day. Has had flu immunization this year. Flu swab negative. Afebril. Discussed likely viral etiology. Potential flu but if so is extremely mild. Exam unremarkable. Supportive measures and OTC medications reviewed. Strict return precautions given.   - POCT Influenza A/B   Leeanne Rio, PA-C

## 2016-07-01 NOTE — Patient Instructions (Signed)
Flu swab is negative.  That being said symptoms are consistent with a viral illness, potentially a mild influenza.  You have no fever and symptoms are mild. See no indication for [place]. Stay well hydrated and get plenty of rest. Tylenol if needed for fever and aches.  Want you to remain home until no fever or symptoms for 24 hours.   Please call or return if you note any new or worsening symptoms.   Viral Respiratory Infection Introduction A viral respiratory infection is an illness that affects parts of the body used for breathing, like the lungs, nose, and throat. It is caused by a germ called a virus. Some examples of this kind of infection are:  A cold.  The flu (influenza).  A respiratory syncytial virus (RSV) infection. How do I know if I have this infection? Most of the time this infection causes:  A stuffy or runny nose.  Yellow or green fluid in the nose.  A cough.  Sneezing.  Tiredness (fatigue).  Achy muscles.  A sore throat.  Sweating or chills.  A fever.  A headache. How is this infection treated? If the flu is diagnosed early, it may be treated with an antiviral medicine. This medicine shortens the length of time a person has symptoms. Symptoms may be treated with over-the-counter and prescription medicines, such as:  Expectorants. These make it easier to cough up mucus.  Decongestant nasal sprays. Doctors do not prescribe antibiotic medicines for viral infections. They do not work with this kind of infection. How do I know if I should stay home? To keep others from getting sick, stay home if you have:  A fever.  A lasting cough.  A sore throat.  A runny nose.  Sneezing.  Muscles aches.  Headaches.  Tiredness.  Weakness.  Chills.  Sweating.  An upset stomach (nausea). Follow these instructions at home:  Rest as much as possible.  Take over-the-counter and prescription medicines only as told by your doctor.  Drink  enough fluid to keep your pee (urine) clear or pale yellow.  Gargle with salt water. Do this 3-4 times per day or as needed. To make a salt-water mixture, dissolve -1 tsp of salt in 1 cup of warm water. Make sure the salt dissolves all the way.  Use nose drops made from salt water. This helps with stuffiness (congestion). It also helps soften the skin around your nose.  Do not drink alcohol.  Do not use tobacco products, including cigarettes, chewing tobacco, and e-cigarettes. If you need help quitting, ask your doctor. Get help if:  Your symptoms last for 10 days or longer.  Your symptoms get worse over time.  You have a fever.  You have very bad pain in your face or forehead.  Parts of your jaw or neck become very swollen. Get help right away if:  You feel pain or pressure in your chest.  You have shortness of breath.  You faint or feel like you will faint.  You keep throwing up (vomiting).  You feel confused. This information is not intended to replace advice given to you by your health care provider. Make sure you discuss any questions you have with your health care provider. Document Released: 04/24/2008 Document Revised: 10/18/2015 Document Reviewed: 10/18/2014  2017 [employer]vier

## 2016-07-02 ENCOUNTER — Ambulatory Visit: Payer: Self-pay | Admitting: Dietician

## 2016-07-04 MED FILL — OMEPRAZOLE DR 40 MG CAPSULE: 40 | 30 days supply | Qty: 60 | Fill #4

## 2016-07-04 MED FILL — GABAPENTIN 600 MG TABLET: 600 | 30 days supply | Qty: 60 | Fill #2

## 2016-07-10 ENCOUNTER — Telehealth: Payer: Self-pay | Admitting: Internal Medicine

## 2016-07-10 ENCOUNTER — Other Ambulatory Visit: Payer: Self-pay

## 2016-07-10 MED ORDER — GLUCOSE BLOOD VI STRP: ORAL_STRIP | 5 refills | Status: DC

## 2016-07-10 MED ORDER — FREESTYLE LITE DEVI: 0 refills | Status: DC

## 2016-07-10 MED ORDER — FREESTYLE LANCETS MISC: 5 refills | Status: DC

## 2016-07-10 MED FILL — FREESTYLE LANCETS: 90 days supply | Qty: 200 | Fill #0

## 2016-07-10 MED FILL — FREESTYLE LITE METER: 1 days supply | Qty: 1 | Fill #0

## 2016-07-10 MED FILL — FREESTYLE LITE TEST STRIP: 90 days supply | Qty: 200 | Fill #0

## 2016-07-10 NOTE — Telephone Encounter (Signed)
Submitted

## 2016-07-10 NOTE — Telephone Encounter (Signed)
Pts insurance is only going to cover the freestyle meter and supplies can we call in new rx to the [facility]

## 2016-07-15 DIAGNOSIS — F319 Bipolar disorder, unspecified: Secondary | ICD-10-CM | POA: Diagnosis not present

## 2016-07-18 DIAGNOSIS — Z9884 Bariatric surgery status: Secondary | ICD-10-CM | POA: Diagnosis not present

## 2016-07-18 MED FILL — ARIPiprazole 5 MG TABS: 5 | 30 days supply | Qty: 30 | Fill #1

## 2016-07-18 MED FILL — lamoTRIgine 200 MG TABS: 200 | 30 days supply | Qty: 60 | Fill #2

## 2016-07-18 MED FILL — metFORMIN HCL 1000 MG TABS: 1000 | 30 days supply | Qty: 60 | Fill #2

## 2016-07-25 MED FILL — TEGRETOL XR 200 MG TABLET: 200 | 30 days supply | Qty: 90 | Fill #2

## 2016-07-31 ENCOUNTER — Telehealth: Payer: Self-pay

## 2016-07-31 ENCOUNTER — Encounter: Payer: 59 | Attending: General Surgery | Admitting: Skilled Nursing Facility1

## 2016-07-31 ENCOUNTER — Encounter: Payer: Self-pay | Admitting: Skilled Nursing Facility1

## 2016-07-31 ENCOUNTER — Telehealth: Payer: Self-pay | Admitting: Internal Medicine

## 2016-07-31 DIAGNOSIS — Z713 Dietary counseling and surveillance: Secondary | ICD-10-CM | POA: Diagnosis not present

## 2016-07-31 DIAGNOSIS — E119 Type 2 diabetes mellitus without complications: Secondary | ICD-10-CM

## 2016-07-31 NOTE — Telephone Encounter (Signed)
Per my notes, she is still taking 27-30 units of Lantus at night. Please decrease this to 20 units and let us know how things are doing after the weekend.

## 2016-07-31 NOTE — Patient Instructions (Addendum)
-  Try decaf tea and other forms of fluid throughout the day  -Try G2 Gatorade or powerade zero  -Make your appointment with your endocrinologist for as soon as possible  -Try Kefir  -Always eat your protein first then start your vegetables   -Fat free cows milk or beans or yogurt for low sugar  -Try the tuna pouches   -Try baked cheese or quest protein chips

## 2016-07-31 NOTE — Telephone Encounter (Signed)
Patient notified

## 2016-07-31 NOTE — Telephone Encounter (Signed)
Called and notified patient of Dr.Gherghe's message. Patient had no questions and will send mychart message next week on how the sugars are doing then.

## 2016-07-31 NOTE — Progress Notes (Signed)
Surgery date: 04/14/2016 Surgery type[facility]28/2016 Weight today: 280.6 lbs  Weight change: 40.6 lbs  TANITA  BODY COMP RESULTS  03/31/16 04/29/16 07/31/2016   BMI (kg/m^2) 53 47.4 41.4   Fat Mass (lbs) 192.2 185.6 136.4   Fat Free Mass (lbs) 166.2 135.6 144.2   Total Body Water (lbs) 125.4 102.2 106.6   Medical Nutrition Therapy:  Appt start time: 1:59 end time:  2:50  Primary concerns today: Post-operative Bariatric Surgery Nutrition Management. Pt states her urine is always very dark (fluid is only 40 ounces). Pt states she has not been taking her metformin due to low blood sugars with increasingly low blood sugars since going to the gym. Pt states she woke up in the middle of the night with a blood sugar of 32. Pt states her mental health appointment march 28th.  24-hr recall: B (AM): 1 egg (7g) Snk (AM): greek yogurt (12g) L (PM): beans or chicken (21g) Snk (PM): string cheese (7g)---sometimes a protein shake D (PM): meat (21g) Snk (PM): greek yogurt (12g)  Fluid intake: protein shake, water, flavored water: 40 ounces Estimated total protein intake: 80 grams protein   Medications: See List: taken off carvediol Supplementation: taking bari multi and calcium  CBG monitoring: 2 times a day  Average CBG per patient: before bed 85-93 fasting 104-109 Last patient reported A1c: 12  Using straws: no Drinking while eating: no Having you been chewing well: yes Chewing/swallowing difficulties:  no Changes in vision: yes: gotten clearer Changes to mood/headaches:  More irritable than before the surgery  Hair loss/Changes to skin/Changes to nails: no Any difficulty focusing or concentrating: no Sweating: no Dizziness/Lightheaded: when blood pressure got too low Palpitations: no Carbonated beverages: no N/V/D/C/GAS: no, 2 times since surgery due to eating too much, no, YES: taking colace, no Abdominal Pain: no  Recent physical activity:   Started at the gym recently: walking 45 minutes 3 days a week   Progress Towards Goal(s):  In progress.  Handouts given during visit include:  NS veggies + protein    Nutritional Diagnosis:  [place]-3.3 Overweight/obesity related to past poor dietary habits and physical inactivity as evidenced by patient w/ recent RYGB surgery following dietary guidelines for continued weight loss. Intervention:  Nutrition counseling. Dietitian educated the pt on advancing her diet to include non-starchy vegetables. Goals: -Try decaf tea and other forms of fluid throughout the day -Try G2 Gatorade or powerade zero -Make your appointment with your endocrinologist for as soon as possible -Try Kefir -Always eat your protein first then start your vegetables  -Fat free cows milk or beans or yogurt for low sugar -Try the tuna pouches  -Try baked cheese or quest protein chips  Teaching Method Utilized:  Visual Auditory Hands on  Barriers to learning/adherence to lifestyle change: none identified   Demonstrated degree of understanding via:  Teach Back   Monitoring/Evaluation:  Dietary intake, exercise, lap band fills, and body weight.

## 2016-07-31 NOTE — Telephone Encounter (Signed)
Pt came by and said that she has been having some lows lately. She said that her sugars have been running below 110.  She said that on one occurrence in the middle of the night, it dropped down to 32. She stated that she noticed it has been low for about a month now. She would like to know what she needs to do, the first available appointment to see you is May 14th, and she would like to know how to proceed until she is able to come in and see you.  Please advise.

## 2016-08-01 ENCOUNTER — Other Ambulatory Visit: Payer: Self-pay | Admitting: Family Medicine

## 2016-08-01 MED FILL — LISINOPRIL 20 MG TABLET: 20 | 90 days supply | Qty: 90 | Fill #0

## 2016-08-05 ENCOUNTER — Encounter: Payer: Self-pay | Admitting: Internal Medicine

## 2016-08-11 MED FILL — GABAPENTIN 600 MG TABLET: 600 | 30 days supply | Qty: 60 | Fill #3

## 2016-08-11 MED FILL — QVAR 80 MCG ORAL INHALER: 80 | 30 days supply | Qty: 9 | Fill #6

## 2016-08-11 MED FILL — FENOFIBRATE 160 MG TABLET: 160 | 90 days supply | Qty: 90 | Fill #1

## 2016-08-11 MED FILL — OMEPRAZOLE DR 40 MG CAPSULE: 40 | 30 days supply | Qty: 60 | Fill #5

## 2016-08-20 DIAGNOSIS — F3162 Bipolar disorder, current episode mixed, moderate: Secondary | ICD-10-CM | POA: Diagnosis not present

## 2016-08-27 ENCOUNTER — Other Ambulatory Visit: Payer: Self-pay | Admitting: Family Medicine

## 2016-08-27 MED FILL — ATORVASTATIN 40 MG TABLET: 40 | 90 days supply | Qty: 90 | Fill #1

## 2016-08-27 MED FILL — ARIPiprazole 10 MG TABS: 10 | 90 days supply | Qty: 90 | Fill #0

## 2016-08-27 MED FILL — lamoTRIgine 200 MG TABS: 200 | 30 days supply | Qty: 60 | Fill #3

## 2016-08-27 MED FILL — raNITIdine HCL 300 MG TABS: 300 | 90 days supply | Qty: 90 | Fill #0

## 2016-09-08 ENCOUNTER — Other Ambulatory Visit: Payer: Self-pay | Admitting: Internal Medicine

## 2016-09-08 ENCOUNTER — Other Ambulatory Visit: Payer: Self-pay | Admitting: Family Medicine

## 2016-09-08 MED FILL — TEGRETOL XR 200 MG TABLET: 200 | 90 days supply | Qty: 270 | Fill #0

## 2016-09-08 MED FILL — OMEPRAZOLE DR 40 MG CAPSULE: 40 | 30 days supply | Qty: 60 | Fill #0

## 2016-09-09 MED FILL — metFORMIN HCL 1000 MG TABS: 1000 | 30 days supply | Qty: 60 | Fill #0

## 2016-09-09 MED FILL — GABAPENTIN 400 MG CAPSULE: 400 | 90 days supply | Qty: 270 | Fill #0

## 2016-09-26 DIAGNOSIS — G4733 Obstructive sleep apnea (adult) (pediatric): Secondary | ICD-10-CM | POA: Diagnosis not present

## 2016-09-29 MED FILL — lamoTRIgine 200 MG TABS: 200 | 30 days supply | Qty: 60 | Fill #0

## 2016-10-05 ENCOUNTER — Telehealth: Payer: 59 | Admitting: Family

## 2016-10-05 DIAGNOSIS — B9789 Other viral agents as the cause of diseases classified elsewhere: Secondary | ICD-10-CM

## 2016-10-05 DIAGNOSIS — J069 Acute upper respiratory infection, unspecified: Secondary | ICD-10-CM | POA: Diagnosis not present

## 2016-10-05 MED ORDER — BENZONATATE 100 MG PO CAPS: 100.0000 mg | ORAL_CAPSULE | Freq: Three times a day (TID) | ORAL | 0 refills | Status: DC | PRN

## 2016-10-05 NOTE — Progress Notes (Signed)
We are sorry that you are not feeling well.  Here is how we plan to help!  Based on what you have shared with me it looks like you have upper respiratory tract inflammation that has resulted in a significant cough.  Inflammation and infection in the upper respiratory tract is commonly called bronchitis and has four common causes:  Allergies, Viral Infections, Acid Reflux and Bacterial Infections.  Allergies, viruses and acid reflux are treated by controlling symptoms or eliminating the cause. An example might be a cough caused by taking certain blood pressure medications. You stop the cough by changing the medication. Another example might be a cough caused by acid reflux. Controlling the reflux helps control the cough.  Based on your presentation I believe you most likely have A cough due to a virus.  This is called viral bronchitis and is best treated by rest, plenty of fluids and control of the cough.  You may use Ibuprofen or Tylenol as directed to help your symptoms.     In addition you may use A non-prescription cough medication called Robitussin DAC. Take 2 teaspoons every 8 hours or Delsym: take 2 teaspoons every 12 hours., A non-prescription cough medication called Mucinex DM: take 2 tablets every 12 hours. and A prescription cough medication called Tessalon Perles 100mg. You may take 1-2 capsules every 8 hours as needed for your cough.   Providers prescribe antibiotics to treat infections caused by bacteria. Antibiotics are very powerful in treating bacterial infections when they are used properly. To maintain their effectiveness, they should be used only when necessary. Overuse of antibiotics has resulted in the development of superbugs that are resistant to treatment!    After careful review of your answers, I would not recommend an antibiotic for your condition.  Antibiotics are not effective against viruses and therefore should not be used to treat them. Common examples of infections caused  by viruses include colds and flu   HOME CARE . Only take medications as instructed by your medical team. . Complete the entire course of an antibiotic. . Drink plenty of fluids and get plenty of rest. . Avoid close contacts especially the very young and the elderly . Cover your mouth if you cough or cough into your sleeve. . Always remember to wash your hands . A steam or ultrasonic humidifier can help congestion.   GET HELP RIGHT AWAY IF: . You develop worsening fever. . You become short of breath . You cough up blood. . Your symptoms persist after you have completed your treatment plan MAKE SURE YOU   Understand these instructions.  Will watch your condition.  Will get help right away if you are not doing well or get worse.  Your e-visit answers were reviewed by a board certified advanced clinical practitioner to complete your personal care plan.  Depending on the condition, your plan could have included both over the counter or prescription medications. If there is a problem please reply  once you have received a response from your provider. Your safety is important to us.  If you have drug allergies check your prescription carefully.    You can use MyChart to ask questions about today's visit, request a non-urgent call back, or ask for a work or school excuse for 24 hours related to this e-Visit. If it has been greater than 24 hours you will need to follow up with your provider, or enter a new e-Visit to address those concerns. You will get an e-mail in   the next two days asking about your experience.  I hope that your e-visit has been valuable and will speed your recovery. Thank you for using e-visits.   

## 2016-10-09 ENCOUNTER — Ambulatory Visit (INDEPENDENT_AMBULATORY_CARE_PROVIDER_SITE_OTHER): Payer: 59 | Admitting: Family Medicine

## 2016-10-09 ENCOUNTER — Encounter: Payer: Self-pay | Admitting: Family Medicine

## 2016-10-09 VITALS — BP 138/86 | HR 78 | Temp 98.7°F | Resp 16 | Ht 69.0 in | Wt 258.2 lb

## 2016-10-09 DIAGNOSIS — E119 Type 2 diabetes mellitus without complications: Secondary | ICD-10-CM | POA: Diagnosis not present

## 2016-10-09 DIAGNOSIS — Z0000 Encounter for general adult medical examination without abnormal findings: Secondary | ICD-10-CM

## 2016-10-09 LAB — CBC WITH DIFFERENTIAL/PLATELET
Basophils Absolute: 0 cells/uL (ref 0–200)
Basophils Relative: 0 %
EOS ABS: 222 {cells}/uL (ref 15–500)
Eosinophils Relative: 3 %
HEMATOCRIT: 40.2 % (ref 35.0–45.0)
HEMOGLOBIN: 13.3 g/dL (ref 11.7–15.5)
LYMPHS ABS: 2664 {cells}/uL (ref 850–3900)
Lymphocytes Relative: 36 %
MCH: 29 pg (ref 27.0–33.0)
MCHC: 33.1 g/dL (ref 32.0–36.0)
MCV: 87.6 fL (ref 80.0–100.0)
MONO ABS: 518 {cells}/uL (ref 200–950)
MPV: 9.9 fL (ref 7.5–12.5)
Monocytes Relative: 7 %
NEUTROS PCT: 54 %
Neutro Abs: 3996 cells/uL (ref 1500–7800)
Platelets: 313 10*3/uL (ref 140–400)
RBC: 4.59 MIL/uL (ref 3.80–5.10)
RDW: 13.7 % (ref 11.0–15.0)
WBC: 7.4 10*3/uL (ref 3.8–10.8)

## 2016-10-09 LAB — BASIC METABOLIC PANEL
BUN: 18 mg/dL (ref 7–25)
CALCIUM: 9.7 mg/dL (ref 8.6–10.2)
CHLORIDE: 105 mmol/L (ref 98–110)
CO2: 24 mmol/L (ref 20–31)
Creat: 0.57 mg/dL (ref 0.50–1.10)
Glucose, Bld: 90 mg/dL (ref 65–99)
POTASSIUM: 4.6 mmol/L (ref 3.5–5.3)
SODIUM: 143 mmol/L (ref 135–146)

## 2016-10-09 LAB — TSH: TSH: 1.3 mIU/L

## 2016-10-09 LAB — HEPATIC FUNCTION PANEL
ALK PHOS: 54 U/L (ref 33–115)
ALT: 18 U/L (ref 6–29)
AST: 19 U/L (ref 10–30)
Albumin: 4.4 g/dL (ref 3.6–5.1)
BILIRUBIN DIRECT: 0.1 mg/dL (ref <=0.2)
BILIRUBIN INDIRECT: 0.2 mg/dL (ref 0.2–1.2)
TOTAL PROTEIN: 6.9 g/dL (ref 6.1–8.1)
Total Bilirubin: 0.3 mg/dL (ref 0.2–1.2)

## 2016-10-09 LAB — LIPID PANEL
Cholesterol: 167 mg/dL (ref <200)
HDL: 60 mg/dL (ref >50)
LDL CALC: 88 mg/dL (ref <100)
Total CHOL/HDL Ratio: 2.8 Ratio (ref <5.0)
Triglycerides: 97 mg/dL (ref <150)
VLDL: 19 mg/dL (ref <30)

## 2016-10-09 NOTE — Assessment & Plan Note (Signed)
Chronic problem, has appt w/ Dr Elvera LennoxGherghe tomorrow.  Will get labs today so they will be available for review tomorrow.  Foot exam done today.  She has eye exam scheduled.  On ACE for renal protection.  Will continue to follow.

## 2016-10-09 NOTE — Progress Notes (Signed)
Pre visit review using our clinic review tool, if applicable. No additional management support is needed unless otherwise documented below in the visit note. 

## 2016-10-09 NOTE — Progress Notes (Signed)
   Subjective:    Patient ID: [name], female    DOB: [birth year], 46 y.o.   MRN: [id number]  HPI CPE- no need for pap due to hysterectomy.  Due for mammo (scheduled w/ Dr Hyacinth MeekerMiller in July).  Due for eye exam (scheduled 6/27) and foot exam.  On ACE for renal protection.  UTD on immunizations.   Review of Systems Patient reports no vision/ hearing changes, adenopathy,fever, weight change,  persistant/recurrent hoarseness , swallowing issues, chest pain, palpitations, edema, persistant/recurrent cough, hemoptysis, dyspnea (rest/exertional/paroxysmal nocturnal), gastrointestinal bleeding (melena, rectal bleeding), abdominal pain, significant heartburn, bowel changes, GU symptoms (dysuria, hematuria, incontinence), Gyn symptoms (abnormal  bleeding, pain),  syncope, focal weakness, memory loss, numbness & tingling, skin/hair/nail changes, abnormal bruising or bleeding, anxiety, or depression.     Objective:   Physical Exam General Appearance:    Alert, cooperative, no distress, appears stated age  Head:    Normocephalic, without obvious abnormality, atraumatic  Eyes:    PERRL, conjunctiva/corneas clear, EOM's intact, fundi    benign, both eyes  Ears:    Normal TM's and external ear canals, both ears  Nose:   Nares normal, septum midline, mucosa normal, no drainage    or sinus tenderness  Throat:   Lips, mucosa, and tongue normal; teeth and gums normal  Neck:   Supple, symmetrical, trachea midline, no adenopathy;    Thyroid: no enlargement/tenderness/nodules  Back:     Symmetric, no curvature, ROM normal, no CVA tenderness  Lungs:     Clear to auscultation bilaterally, respirations unlabored  Chest Wall:    No tenderness or deformity   Heart:    Regular rate and rhythm, S1 and S2 normal, no murmur, rub   or gallop  Breast Exam:    Deferred to GYN  Abdomen:     Soft, non-tender, bowel sounds active all four quadrants,    no masses, no organomegaly  Genitalia:    Deferred to GYN  Rectal:      Extremities:   Extremities normal, atraumatic, no cyanosis or edema  Pulses:   2+ and symmetric all extremities  Skin:   Skin color, texture, turgor normal, no rashes or lesions  Lymph nodes:   Cervical, supraclavicular, and axillary nodes normal  Neurologic:   CNII-XII intact, normal strength, sensation and reflexes    throughout          Assessment & Plan:

## 2016-10-09 NOTE — Patient Instructions (Signed)
Follow up in 6 months to recheck BP and cholesterol We'll notify you of your lab results and make any changes if needed Continue to work on healthy diet and regular exercise- you are doing great! Call and schedule your mammo Please have your eye doctor send me a copy of their notes Call with any questions or concerns Have a great summer!!!

## 2016-10-09 NOTE — Assessment & Plan Note (Signed)
Pt's PE WNL.  She continues to lose weight since her bariatric surgery.  Applauded her efforts.  Due for mammo- plans to schedule.  Check labs.  Anticipatory guidance provided.

## 2016-10-10 ENCOUNTER — Encounter: Payer: Self-pay | Admitting: Internal Medicine

## 2016-10-10 ENCOUNTER — Ambulatory Visit (INDEPENDENT_AMBULATORY_CARE_PROVIDER_SITE_OTHER): Payer: 59 | Admitting: Internal Medicine

## 2016-10-10 ENCOUNTER — Encounter: Payer: Self-pay | Admitting: General Practice

## 2016-10-10 VITALS — BP 158/100 | HR 53 | Temp 98.0°F | Ht 69.0 in | Wt 258.4 lb

## 2016-10-10 DIAGNOSIS — E119 Type 2 diabetes mellitus without complications: Secondary | ICD-10-CM

## 2016-10-10 LAB — HEMOGLOBIN A1C
Hgb A1c MFr Bld: 6.1 % — ABNORMAL HIGH (ref <5.7)
Mean Plasma Glucose: 128 mg/dL

## 2016-10-10 LAB — VITAMIN D 25 HYDROXY (VIT D DEFICIENCY, FRACTURES): Vit D, 25-Hydroxy: 38 ng/mL (ref 30–100)

## 2016-10-10 MED ORDER — METFORMIN HCL 500 MG PO TABS: 500.0000 mg | ORAL_TABLET | Freq: Two times a day (BID) | ORAL | 3 refills | Status: DC

## 2016-10-10 MED FILL — metFORMIN HCL 500 MG TABS: 500 | 90 days supply | Qty: 180 | Fill #0

## 2016-10-10 NOTE — Patient Instructions (Addendum)
Please decrease Metformin 500 mg 2x a day.  Please come back for a follow-up appointment in 6 months.

## 2016-10-10 NOTE — Progress Notes (Signed)
Patient ID: [name], female   DOB: 1971/03/04, 46 y.o.   MRN: [id number]  HPI: [name] is a 46 y.o.-year-old female, returning for f/u for DM2, dx in 2001, insulin-dependent 2007, uncontrolled, without complications. Last visit 8 mo ago.  Since last visit, she had Roux en Y sx in 03/2016 >> from a weight of 365 lbs last fall she dropped to 258 today! She feels great!  Last hemoglobin A1c was: Lab Results  Component Value Date   HGBA1C 6.1 (H) 10/09/2016   HGBA1C 9.6 02/21/2016   HGBA1C 9.2 10/05/2015   HGBA1C 10.4 05/31/2015   HGBA1C 8.8 01/09/2015   HGBA1C 11.0 (H) 06/21/2014   HGBA1C 8.8 (H) 01/03/2014   HGBA1C 9.6 (H) 04/12/2013   HGBA1C 11.0 (H) 11/18/2012   HGBA1C 9.0 (H) 11/12/2011   HGBA1C 9.6 (H) 06/23/2011   Pt was on a regimen of: - Metformin 2000 mg at dinnertime - Lantus 63 units at bedtime - Victoza 1.8 mg daily - Amaryl 4 mg 2x a day She had frequent yeast inf when sugars were higher before.   Pt is now on a regimen of: - Metformin 1000 mg 2x a day  Pt checks sugars 1x a day: - am: 120-130 >> 147 >> 119-160 >> 80-90s - 2h after b'fast: n/c >> 145-176 >> n/c - before lunch: n/c >> 82-167 >> 120s - 2h after lunch: n/c >> 106-166 >> n/c - before dinner: n/c >> 86-110 >> n/c - 2h after dinner: n/c >> 145 >> 150-190 >> 83-101 - bedtime: 107, 125-180 when having a good dinner; if goes out: 250-260 >> 83-101 - nighttime: n/c No lows. Lowest sugar was 89 >> 99 >> 262 >> 34 (after Sx - while still on insulin); she has hypoglycemia awareness at 105.  Highest sugar was 400-500 >> 300s >> 200s >> 227 >> 128 (forgot Metformin).  Glucometer: One Touch Ultra 2    - No CKD, last BUN/creatinine:  Lab Results  Component Value Date   BUN 18 10/09/2016   CREATININE 0.57 10/09/2016  On Lisinopril. - last set of lipids: Lab Results  Component Value Date   CHOL 167 10/09/2016   HDL 60 10/09/2016   LDLCALC 88 10/09/2016   LDLDIRECT 112.0 06/21/2014   TRIG 97  10/09/2016   CHOLHDL 2.8 10/09/2016  On Atorvastatin >> she will stop soon per PCPs recs.. - last eye exam was 08/2015. No DR. Dr Emily Filbert.  - she denies numbness and tingling in her feet.  On ASA 81.   ROS: Constitutional: + weight loss, no fatigue, no subjective hyperthermia, no subjective hypothermia Eyes: no blurry vision, no xerophthalmia ENT: no sore throat, no nodules palpated in throat, no dysphagia, no odynophagia, no hoarseness Cardiovascular: no CP/no SOB/no palpitations/no leg swelling Respiratory: no cough/no SOB/no wheezing Gastrointestinal: no N/no V/no D/no C/no acid reflux Musculoskeletal: no muscle aches/no joint aches Skin: no rashes, no hair loss Neurological: no tremors/no numbness/no tingling/no dizziness  I reviewed pt's medications, allergies, PMH, social hx, family hx, and changes were documented in the history of present illness. Otherwise, unchanged from my initial visit note.   Past Medical History:  Diagnosis Date  . Allergy   . Asthma    pt stated treated as ashtma but not really asthma  . Bipolar disorder (HCC)   . Chicken pox   . Depression   . Diabetes mellitus   . GERD (gastroesophageal reflux disease)   . Hyperlipidemia   . Hypertension    readings  .  Irregular heartbeat   . Migraine   . Sleep apnea    Past Surgical History:  Procedure Laterality Date  . ANKLE SURGERY  1989   left  . BACK SURGERY  1999  . CESAREAN SECTION     1995  . LAPAROSCOPIC ASSISTED VAGINAL HYSTERECTOMY  2009     BSO fibroids, DUB, pelvic pain  . 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 OOPHORECTOMY Bilateral 2009   cyst  . THYROIDECTOMY, PARTIAL  2001   removed left  . UMBILICAL HERNIA REPAIR  2003   Social History   Social History  . Marital Status:  Married    Spouse Name: N/A  . Number of Children: 1   Occupational History  . Registrar, Administrator   Social History Main Topics  . Smoking status: Never Smoker   . Smokeless tobacco: Never Used  . Alcohol Use: No  . Drug Use: No   Current Outpatient Prescriptions on File Prior to Visit  Medication Sig Dispense Refill  . ARIPiprazole (ABILIFY) 10 MG tablet Take 10 mg by mouth once.   1  . atorvastatin (LIPITOR) 40 MG tablet TAKE 1 TABLET BY MOUTH DAILY 90 tablet 1  . B-D UF III MINI PEN NEEDLES 31G X 5 MM MISC USE TO INJECT INSULIN 2 TIMES DAILY 180 each 3  . Blood Glucose Monitoring Suppl (FREESTYLE LITE) DEVI Use to check sugar 2 times daily 1 each 0  . carbamazepine (TEGRETOL XR) 200 MG 12 hr tablet 1 tablet in the morning, 2 at night    . fenofibrate 160 MG tablet TAKE 1 TABLET BY MOUTH DAILY 90 tablet 1  . fluticasone (FLONASE) 50 MCG/ACT nasal spray Place 2 sprays into both nostrils daily. (Patient taking differently: Place 2 sprays into both nostrils daily as needed for allergies or rhinitis. ) 16 g 2  . gabapentin (NEURONTIN) 400 MG capsule Take 400 mg by mouth 3 (three) times daily.   1  . glucose blood (FREESTYLE LITE) test strip Use as instructed to check sugar 2 times daily 200 each 5  . lamoTRIgine (LAMICTAL) 200 MG tablet Take 200 mg by mouth 2 (two) times daily.    . Lancets (FREESTYLE) lancets Use as instructed to check sugar 2 times daily 200 each 5  . lisinopril (PRINIVIL,ZESTRIL) 20 MG tablet TAKE 1 TABLET BY MOUTH DAILY 90 tablet 1  . metFORMIN (GLUCOPHAGE) 1000 MG tablet TAKE 1 TABLET (1,000 MG TOTAL) BY MOUTH 2 TIMES DAILY WITH MEAL 60 tablet 2  . omeprazole (PRILOSEC) 40 MG capsule TAKE 1 CAPSULE BY MOUTH TWICE A DAY 60 capsule 5  . PRESCRIPTION MEDICATION Bariatric advantage calcium supplement and multivitamin    . QVAR 80 MCG/ACT inhaler INHALE 2 PUFFS BY MOUTH INTO THE LUNGS 2 (TWO) TIMES DAILY. (Patient taking differently: INHALE 2 PUFFS BY MOUTH INTO  THE LUNGS QHS) 8.7 g 6  . ranitidine (ZANTAC) 300 MG tablet TAKE 1 TABLET (300 MG TOTAL) BY MOUTH AT BEDTIME. 90 tablet 1  . tiaGABine (GABITRIL) 4 MG tablet Take 8 mg by mouth 2 (two) times daily.    . VENTOLIN HFA 108 (90 Base) MCG/ACT inhaler INHALE 2 PUFFS BY MOUTH INTO THE LUNGS EVERY 4 HOURS AS NEEDED FOR WHEEZING OR SHORTNESS OF BREATH 18 g 3   No current facility-administered medications  on file prior to visit.    Allergies  Allergen Reactions  . Zofran [Ondansetron Hcl] Other (See Comments)    Severe headache   Family History  Problem Relation Age of Onset  . Hyperlipidemia Mother   . Diabetes Mother   . Anxiety disorder Mother   . Heart disease Mother   . Hypertension Father   . Lupus Father   . Heart disease Father   . Stroke Maternal Aunt   . Stroke Paternal Grandfather   . Mental illness Paternal Grandfather    PE: BP (!) 158/100 (BP Location: Right Arm, Patient Position: Sitting, Cuff Size: Normal)   Pulse (!) 53   Temp 98 F (36.7 C) (Oral)   Ht 5\' 9"  (1.753 m)   Wt 258 lb 6.4 oz (117.2 kg)   LMP 01/25/2008   SpO2 96%   BMI 38.16 kg/m  Wt Readings from Last 3 Encounters:  10/10/16 258 lb 6.4 oz (117.2 kg)  10/09/16 258 lb 4 oz (117.1 kg)  07/31/16 280 lb 9.6 oz (127.3 kg)   Constitutional: obese, in NAD Eyes: PERRLA, EOMI, no exophthalmos ENT: moist mucous membranes, no thyromegaly, no cervical lymphadenopathy Cardiovascular: RRR, No MRG Respiratory: CTA B Gastrointestinal: abdomen soft, NT, ND, BS+ Musculoskeletal: no deformities, strength intact in all 4 Skin: moist, warm, no rashes Neurological: no tremor with outstretched hands, DTR normal in all 4  ASSESSMENT: 1. DM2, insulin-dependent, uncontrolled, without complications  PLAN:  1. Patient with long-standing, previously uncontrolled diabetes, on a complex antidiabetic regimen, now s/p R en Y GBP 6 mo ago, and s/p weight loss of >100 lbs >> off all meds except Metformin, with almost all  sugars <100. She is feeling great, sticking to the diet and going to the gym. - we will decrease Metformin at this visit and may even stop at next visit - I advised her to:  Patient Instructions  Please decrease Metformin 500 mg 2x a day.  Please come back for a follow-up appointment in 6 months.  - HbA1c >> wonderful yesterday: Lab Results  Component Value Date   HGBA1C 6.1 (H) 10/09/2016  - continue checking sugars at different times of the day - check 1x a day, rotating checks - advised for yearly eye exams >> she needs one - Return to clinic in 6 mo with sugar log    Carlus Pavlovristina Tabrina Esty, MD PhD [facility]

## 2016-10-12 ENCOUNTER — Encounter: Payer: Self-pay | Admitting: Family Medicine

## 2016-10-13 MED ORDER — QVAR 80 MCG/ACT IN AERS: INHALATION_SPRAY | RESPIRATORY_TRACT | 6 refills | Status: DC

## 2016-10-13 MED FILL — QVAR 80 MCG ORAL INHALER: 80 | 30 days supply | Qty: 9 | Fill #0

## 2016-10-13 MED FILL — OMEPRAZOLE DR 40 MG CAPSULE: 40 | 30 days supply | Qty: 60 | Fill #1

## 2016-10-15 DIAGNOSIS — F3162 Bipolar disorder, current episode mixed, moderate: Secondary | ICD-10-CM | POA: Diagnosis not present

## 2016-10-21 ENCOUNTER — Other Ambulatory Visit: Payer: Self-pay | Admitting: Family Medicine

## 2016-10-21 DIAGNOSIS — Z1231 Encounter for screening mammogram for malignant neoplasm of breast: Secondary | ICD-10-CM

## 2016-10-23 ENCOUNTER — Encounter: Payer: Self-pay | Admitting: Family Medicine

## 2016-10-23 DIAGNOSIS — Z09 Encounter for follow-up examination after completed treatment for conditions other than malignant neoplasm: Secondary | ICD-10-CM | POA: Diagnosis not present

## 2016-10-23 DIAGNOSIS — Z9884 Bariatric surgery status: Secondary | ICD-10-CM | POA: Diagnosis not present

## 2016-10-23 MED ORDER — HYDROCHLOROTHIAZIDE 12.5 MG PO TABS: 12.5000 mg | ORAL_TABLET | Freq: Every day | ORAL | 1 refills | Status: DC

## 2016-10-24 MED ORDER — HYDROCHLOROTHIAZIDE 12.5 MG PO TABS: 12.5000 mg | ORAL_TABLET | Freq: Every day | ORAL | 1 refills | Status: DC

## 2016-10-24 MED FILL — HYDROCHLOROTHIAZIDE 12.5 MG: 12.5 | 90 days supply | Qty: 90 | Fill #0

## 2016-10-24 NOTE — Addendum Note (Signed)
Addended by: Sheliah HatchABORI, Cheyanne Lamison E on: 10/24/2016 11:22 AM   Modules accepted: Orders

## 2016-10-28 DIAGNOSIS — F319 Bipolar disorder, unspecified: Secondary | ICD-10-CM | POA: Diagnosis not present

## 2016-10-28 MED FILL — lamoTRIgine 200 MG TABS: 200 | 90 days supply | Qty: 180 | Fill #0

## 2016-10-28 MED FILL — LISINOPRIL 20 MG TABLET: 20 | 90 days supply | Qty: 90 | Fill #1

## 2016-10-28 MED FILL — tiaGABine HCL 4 MG TABS: 4 | 30 days supply | Qty: 120 | Fill #1

## 2016-11-05 ENCOUNTER — Encounter: Payer: Self-pay | Admitting: Registered"

## 2016-11-05 ENCOUNTER — Encounter: Payer: 59 | Attending: General Surgery | Admitting: Registered"

## 2016-11-05 DIAGNOSIS — Z713 Dietary counseling and surveillance: Secondary | ICD-10-CM | POA: Insufficient documentation

## 2016-11-05 DIAGNOSIS — E119 Type 2 diabetes mellitus without complications: Secondary | ICD-10-CM

## 2016-11-05 NOTE — Patient Instructions (Addendum)
-   Aim for at least 64 oz of fluid per day. Schedule in when to take sips of fluid throughout your day such as when sitting or standing, etc.   - Keep a water bottle on you at all times.   - Increase physical activity by walking in neighborhood on weekends in morning or late evenings

## 2016-11-05 NOTE — Progress Notes (Signed)
Surgery date: 04/14/2016 Surgery type: [facility]YGB [facility] lbs on 02/28/2016 Weight today: 251.6 lbs Weight change: 29 lbs loss from 280.6 on 07/31/2016 Total weight loss from start weight: 106.4 lbs  TANITA  BODY COMP RESULTS  03/31/16 04/29/16 07/31/2016 11/05/2016   BMI (kg/m^2) 53 47.4 41.4 37.1   Fat Mass (lbs) 192.2 185.6 136.4 110.6   Fat Free Mass (lbs) 166.2 135.6 144.2 140.8   Total Body Water (lbs) 125.4 102.2 106.6 103.0   Medical Nutrition Therapy:  Appt start time: 3:45 end time:  4:30  Primary concerns today: Post-operative Bariatric Surgery Nutrition Management. Pt states she feels great and has more energy. Pt states she is happy that she's able to go to the gym. Pt states she is only getting about 42 oz of fluid daily. Pt states her last A1c was 6.1 in 09/2016. Pt states she plans to start strength/weight training soon and increasing her physical activity to 5 times per week.     24-hr recall: B (AM): 1.5 egg (12g) Snk (AM): greek yogurt (12g) L (PM): beans or chicken (21g) Snk (PM): greek yogurt (12g) and vegetables  D (PM): meat (21g) + vegetables (broccoli, zucchini, carrots) Snk (PM): sugar-free popsicle  Fluid intake: water, flavored water: 42 ounces Estimated total protein intake: 78 grams protein   Medications: See List Supplementation: taking bari multi and calcium  CBG monitoring: 2 times a day  Average CBG per patient: before bed 85-105 fasting 85-105 Last patient reported A1c: 6.1  Using straws: no Drinking while eating: no Having you been chewing well: yes Chewing/swallowing difficulties:  no Changes in vision: yes: no Changes to mood/headaches: no Hair loss/Changes to skin/Changes to nails: some hair loss, began biotin last night Any difficulty focusing or concentrating: no Sweating: no Dizziness/Lightheaded: no Palpitations: no Carbonated beverages: no N/V/D/C/GAS: no, no, YES: taking colace 1-2 times per month, no Abdominal  Pain: no  Recent physical activity:  Started at the gym recently: walking 45 minutes 3 days a week, plans to start using the machines  Progress Towards Goal(s):  In progress.  Handouts given during visit include:  Snack Ideas for bariatric surgery    Nutritional Diagnosis:  [place]-3.3 Overweight/obesity related to past poor dietary habits and physical inactivity as evidenced by patient w/ recent RYGB surgery following dietary guidelines for continued weight loss.  Intervention:  Nutrition counseling. Dietitian educated the pt on advancing her diet to include non-starchy vegetables. Goals: - Aim for at least 64 oz of fluid per day. Schedule in when to take sips of fluid throughout your day such as when sitting or standing, etc.  - Keep a water bottle on you at all times.  - Increase physical activity by walking in neighborhood on weekends in morning or late evenings  Teaching Method Utilized:  Visual Auditory Hands on  Barriers to learning/adherence to lifestyle change: none identified   Demonstrated degree of understanding via:  Teach Back   Monitoring/Evaluation:  Dietary intake, exercise, lap band fills, and body weight.

## 2016-11-06 ENCOUNTER — Ambulatory Visit (INDEPENDENT_AMBULATORY_CARE_PROVIDER_SITE_OTHER): Payer: 59 | Admitting: Family Medicine

## 2016-11-06 ENCOUNTER — Encounter: Payer: Self-pay | Admitting: Family Medicine

## 2016-11-06 VITALS — BP 118/78 | HR 56 | Temp 98.1°F | Resp 16 | Ht 69.0 in | Wt 254.0 lb

## 2016-11-06 DIAGNOSIS — I10 Essential (primary) hypertension: Secondary | ICD-10-CM

## 2016-11-06 LAB — BASIC METABOLIC PANEL
BUN: 15 mg/dL (ref 7–25)
CALCIUM: 9.4 mg/dL (ref 8.6–10.2)
CO2: 29 mmol/L (ref 20–31)
CREATININE: 0.5 mg/dL (ref 0.50–1.10)
Chloride: 99 mmol/L (ref 98–110)
GLUCOSE: 97 mg/dL (ref 65–99)
Potassium: 4 mmol/L (ref 3.5–5.3)
Sodium: 137 mmol/L (ref 135–146)

## 2016-11-06 MED ORDER — LISINOPRIL-HYDROCHLOROTHIAZIDE 20-12.5 MG PO TABS: 1.0000 | ORAL_TABLET | Freq: Every day | ORAL | 1 refills | Status: DC

## 2016-11-06 MED ORDER — CLOBETASOL PROPIONATE 0.05 % EX OINT: 1.0000 "application " | TOPICAL_OINTMENT | Freq: Two times a day (BID) | CUTANEOUS | 1 refills | Status: DC

## 2016-11-06 MED FILL — CLOBETASOL 0.05% OINTMENT: 0.05 | 30 days supply | Qty: 60 | Fill #0

## 2016-11-06 MED FILL — LISINOPRIL-HCTZ 20-12.5 MG: 20-12.5 | 90 days supply | Qty: 90 | Fill #0

## 2016-11-06 NOTE — Progress Notes (Signed)
   Subjective:    Patient ID: [name], female    DOB: 1971-01-10, 46 y.o.   MRN: [id number]  HPI HTN- chronic problem.  BP was elevated so we restarted HCTZ 12.5mg  in addition to Lisinopril 20mg  daily.  Pt reports HAs have resolved, no visual changes.  No CP, SOB, edema.  Pt reports home BPs are similar to today's reading.   Review of Systems For ROS see HPI     Objective:   Physical Exam  Constitutional: She is oriented to person, place, and time. She appears well-developed and well-nourished. No distress.  obese  HENT:  Head: Normocephalic and atraumatic.  Eyes: Conjunctivae and EOM are normal. Pupils are equal, round, and reactive to light.  Neck: Normal range of motion. Neck supple. No thyromegaly present.  Cardiovascular: Normal rate, regular rhythm, normal heart sounds and intact distal pulses.   No murmur heard. Pulmonary/Chest: Effort normal and breath sounds normal. No respiratory distress.  Abdominal: Soft. She exhibits no distension. There is no tenderness.  Musculoskeletal: She exhibits no edema.  Lymphadenopathy:    She has no cervical adenopathy.  Neurological: She is alert and oriented to person, place, and time.  Skin: Skin is warm and dry.  Psychiatric: She has a normal mood and affect. Her behavior is normal.  Vitals reviewed.         Assessment & Plan:

## 2016-11-06 NOTE — Progress Notes (Signed)
Pre visit review using our clinic review tool, if applicable. No additional management support is needed unless otherwise documented below in the visit note. 

## 2016-11-06 NOTE — Patient Instructions (Signed)
Follow up as needed/scheduled We'll notify you of your lab results and make any changes if needed Continue to work on healthy diet and regular exercise- you look great! The new prescription will be the Lisinopril HCTZ combo pill- so just 1 daily Call with any questions or concerns Happy Early [place] Ouch[place]irthday!!!

## 2016-11-06 NOTE — Assessment & Plan Note (Signed)
Chronic problem.  Much better control today since restarting HCTZ daily.  Will combine her Lisinopril and HCTZ into 1 pill.  Check BMP given addition of HCTZ.  No anticipated med changes.

## 2016-11-07 ENCOUNTER — Ambulatory Visit
Admission: RE | Admit: 2016-11-07 | Discharge: 2016-11-07 | Disposition: A | Discharge status: Home / Self Care | Payer: 59 | Source: Ambulatory Visit | Attending: Family Medicine | Admitting: Family Medicine

## 2016-11-07 DIAGNOSIS — Z1231 Encounter for screening mammogram for malignant neoplasm of breast: Secondary | ICD-10-CM | POA: Diagnosis not present

## 2016-11-18 DIAGNOSIS — G4733 Obstructive sleep apnea (adult) (pediatric): Secondary | ICD-10-CM | POA: Diagnosis not present

## 2016-11-19 DIAGNOSIS — E119 Type 2 diabetes mellitus without complications: Secondary | ICD-10-CM | POA: Diagnosis not present

## 2016-11-19 LAB — HM DIABETES EYE EXAM

## 2016-11-20 DIAGNOSIS — M75111 Incomplete rotator cuff tear or rupture of right shoulder, not specified as traumatic: Secondary | ICD-10-CM | POA: Diagnosis not present

## 2016-11-20 MED FILL — raNITIdine HCL 300 MG TABS: 300 | 90 days supply | Qty: 90 | Fill #1

## 2016-11-27 ENCOUNTER — Encounter: Payer: Self-pay | Admitting: General Practice

## 2016-11-27 DIAGNOSIS — M75111 Incomplete rotator cuff tear or rupture of right shoulder, not specified as traumatic: Secondary | ICD-10-CM | POA: Diagnosis not present

## 2016-12-03 ENCOUNTER — Other Ambulatory Visit: Payer: Self-pay | Admitting: Family Medicine

## 2016-12-03 MED FILL — tiaGABine HCL 4 MG TABS: 4 | 90 days supply | Qty: 360 | Fill #0

## 2016-12-03 MED FILL — ARIPiprazole 10 MG TABS: 10 | 90 days supply | Qty: 90 | Fill #1

## 2016-12-03 MED FILL — ATORVASTATIN 40 MG TABLET: 40 | 90 days supply | Qty: 90 | Fill #0

## 2016-12-03 MED FILL — GABAPENTIN 400 MG CAPSULE: 400 | 90 days supply | Qty: 270 | Fill #1

## 2016-12-03 MED FILL — TEGRETOL XR 200 MG TABLET: 200 | 90 days supply | Qty: 270 | Fill #1

## 2016-12-04 ENCOUNTER — Encounter: Payer: Self-pay | Admitting: Adult Health

## 2016-12-04 ENCOUNTER — Ambulatory Visit (INDEPENDENT_AMBULATORY_CARE_PROVIDER_SITE_OTHER): Payer: 59 | Admitting: Adult Health

## 2016-12-04 VITALS — BP 116/70 | HR 62 | Ht 69.0 in | Wt 246.8 lb

## 2016-12-04 DIAGNOSIS — G4733 Obstructive sleep apnea (adult) (pediatric): Secondary | ICD-10-CM

## 2016-12-04 NOTE — Progress Notes (Signed)
@Patient  ID: [name], female    DOB: 1970-10-16, 46 y.o.   MRN: [id number]  Chief Complaint  Patient presents with  . Follow-up    OSA     Referring provider: Sheliah Hatch, MD  HPI: 46 year old female followed for obstructive sleep apnea  TEST Significant tests/ events PSG 2008 showed moderate OSA with AHI of 24 per hour, corrected by C Pap of 11 cm with a small nasal mask.  PFTs 03/2016 showed no evidence of airway obstruction, lung volumes normal, DLCO 76% and corrects for alveolar volume-consistent with obesity     12/04/2016 follow-up sleep apnea Patient returns for a six-month follow-up for sleep apnea. Patient had gastric bypass surgery in November 2017 and has lost 118 pounds since surgery. She says she feels the best that she has felt in greater than 20 years.  Patient is on C Pap for moderate sleep apnea. Patient says that she is wearing her C Pap. However, since her weight loss over the last several weeks she has noticed that her pressure. Feels way too high. She feels like that it is pushing on her face. She did go a couple nights without wearing her machine but felt sleepy the next day. We discussed adjusting her pressure settings. Patient says that she tried AutoSet in the past but did not care for this setting.  Download shows excellent compliance with average usage at around 5 hours. AHI 0.8. Patient's on C Pap 11 cm H2O.  Allergies  Allergen Reactions  . Zofran [Ondansetron Hcl] Other (See Comments)    Severe headache    Immunization History  Administered Date(s) Administered  . Influenza Split 03/13/2015  . Influenza,inj,Quad PF,36+ Mos 04/05/2013, 02/22/2014, 02/08/2016  . Pneumococcal Polysaccharide-23 07/25/2001  . Tdap 10/29/2007    Past Medical History:  Diagnosis Date  . Allergy   . Asthma    pt stated treated as ashtma but not really asthma  . Bipolar disorder (HCC)   . Chicken pox   . Depression   . Diabetes mellitus   . GERD  (gastroesophageal reflux disease)   . Hyperlipidemia   . Hypertension    readings  . Irregular heartbeat   . Migraine   . Sleep apnea     Tobacco History: History  Smoking Status  . Never Smoker  Smokeless Tobacco  . Never Used   Counseling given: Not Answered   Outpatient Encounter Prescriptions as of 12/04/2016  Medication Sig  . ARIPiprazole (ABILIFY) 10 MG tablet Take 10 mg by mouth once.   [name] atorvastatin (LIPITOR) 40 MG tablet TAKE 1 TABLET BY MOUTH DAILY  . B-D UF III MINI PEN NEEDLES 31G X 5 MM MISC USE TO INJECT INSULIN 2 TIMES DAILY  . Blood Glucose Monitoring Suppl (FREESTYLE LITE) DEVI Use to check sugar 2 times daily  . carbamazepine (TEGRETOL XR) 200 MG 12 hr tablet 1 tablet in the morning, 2 at night  . clobetasol ointment (TEMOVATE) 0.05 % Apply 1 application topically 2 (two) times daily. Do not use for more than 7 days  . fenofibrate 160 MG tablet TAKE 1 TABLET BY MOUTH DAILY  . fluticasone (FLONASE) 50 MCG/ACT nasal spray Place 2 sprays into both nostrils daily. (Patient taking differently: Place 2 sprays into both nostrils daily as needed for allergies or rhinitis. )  . gabapentin (NEURONTIN) 400 MG capsule Take 400 mg by mouth 3 (three) times daily.   [name] glucose blood (FREESTYLE LITE) test strip Use as instructed to check sugar  2 times daily  . lamoTRIgine (LAMICTAL) 200 MG tablet Take 200 mg by mouth 2 (two) times daily.  . Lancets (FREESTYLE) lancets Use as instructed to check sugar 2 times daily  . lisinopril-hydrochlorothiazide (PRINZIDE,ZESTORETIC) 20-12.5 MG tablet Take 1 tablet by mouth daily.  . metFORMIN (GLUCOPHAGE) 500 MG tablet Take 1 tablet (500 mg total) by mouth 2 (two) times daily with a meal.  . omeprazole (PRILOSEC) 40 MG capsule TAKE 1 CAPSULE BY MOUTH TWICE A DAY  . PRESCRIPTION MEDICATION Bariatric advantage calcium supplement and multivitamin  . QVAR 80 MCG/ACT inhaler INHALE 2 PUFFS BY MOUTH INTO THE LUNGS 2 (TWO) TIMES DAILY.  .  ranitidine (ZANTAC) 300 MG tablet TAKE 1 TABLET (300 MG TOTAL) BY MOUTH AT BEDTIME.  . tiaGABine (GABITRIL) 4 MG tablet Take 8 mg by mouth 2 (two) times daily.  . VENTOLIN HFA 108 (90 Base) MCG/ACT inhaler INHALE 2 PUFFS BY MOUTH INTO THE LUNGS EVERY 4 HOURS AS NEEDED FOR WHEEZING OR SHORTNESS OF BREATH   No facility-administered encounter medications on file as of 12/04/2016.      Review of Systems  Constitutional:   No  weight loss, night sweats,  Fevers, chills, fatigue, or  lassitude.  HEENT:   No headaches,  Difficulty swallowing,  Tooth/dental problems, or  Sore throat,                No sneezing, itching, ear ache, nasal congestion, post nasal drip,   CV:  No chest pain,  Orthopnea, PND, swelling in lower extremities, anasarca, dizziness, palpitations, syncope.   GI  No heartburn, indigestion, abdominal pain, nausea, vomiting, diarrhea, change in bowel habits, loss of appetite, bloody stools.   Resp: No shortness of breath with exertion or at rest.  No excess mucus, no productive cough,  No non-productive cough,  No coughing up of blood.  No change in color of mucus.  No wheezing.  No chest wall deformity  Skin: no rash or lesions.  GU: no dysuria, change in color of urine, no urgency or frequency.  No flank pain, no hematuria   MS:  No joint pain or swelling.  No decreased range of motion.  No back pain.    Physical Exam  BP 116/70 (BP Location: Left Arm, Cuff Size: Normal)   Pulse 62   Ht 5\' 9"  (1.753 m)   Wt 246 lb 12.8 oz (111.9 kg)   LMP 01/25/2008   SpO2 96%   BMI 36.45 kg/m   GEN: A/Ox3; pleasant , NAD, obese    HEENT:  [place]/AT,  EACs-clear, TMs-wnl, NOSE-clear, THROAT-clear, no lesions, no postnasal drip or exudate noted. Class 2 MP airway   NECK:  Supple w/ fair ROM; no JVD; normal carotid impulses w/o bruits; no thyromegaly or nodules palpated; no lymphadenopathy.    RESP  Clear  P & A; w/o, wheezes/ rales/ or rhonchi. no accessory muscle use, no dullness  to percussion  CARD:  RRR, no m/r/g, no peripheral edema, pulses intact, no cyanosis or clubbing.  GI:   Soft & nt; nml bowel sounds; no organomegaly or masses detected.   Musco: Warm bil, no deformities or joint swelling noted.   Neuro: alert, no focal deficits noted.    Skin: Warm, no lesions or rashes     Lab Results:  CBC    Component Value Date/Time   WBC 7.4 10/09/2016 1602   RBC 4.59 10/09/2016 1602   HGB 13.3 10/09/2016 1602   HCT 40.2 10/09/2016 1602  PLT 313 10/09/2016 1602   MCV 87.6 10/09/2016 1602   MCH 29.0 10/09/2016 1602   MCHC 33.1 10/09/2016 1602   RDW 13.7 10/09/2016 1602   LYMPHSABS 2,664 10/09/2016 1602   MONOABS 518 10/09/2016 1602   EOSABS 222 10/09/2016 1602   BASOSABS 0 10/09/2016 1602    BMET    Component Value Date/Time   NA 137 11/06/2016 1524   K 4.0 11/06/2016 1524   CL 99 11/06/2016 1524   CO2 29 11/06/2016 1524   GLUCOSE 97 11/06/2016 1524   BUN 15 11/06/2016 1524   CREATININE 0.50 11/06/2016 1524   CALCIUM 9.4 11/06/2016 1524   GFRNONAA >60 04/04/2016 1514   GFRAA >60 04/04/2016 1514    BNP No results found for: BNP  ProBNP    Component Value Date/Time   PROBNP 22.0 01/25/2016 1605    Imaging: Mm Screening Breast Tomo Bilateral  Result Date: 11/10/2016 CLINICAL DATA:  Screening. EXAM: 2D DIGITAL SCREENING BILATERAL MAMMOGRAM WITH CAD AND ADJUNCT TOMO COMPARISON:  Previous exam(s). ACR Breast Density Category b: There are scattered areas of fibroglandular density. FINDINGS: There are no findings suspicious for malignancy. Images were processed with CAD. IMPRESSION: No mammographic evidence of malignancy. A result letter of this screening mammogram will be mailed directly to the patient. RECOMMENDATION: Screening mammogram in one year. (Code:SM-B-01Y) BI-RADS CATEGORY  1: Negative. Electronically Signed   By: Amie Portland M.D.   On: 11/10/2016 15:47     Assessment & Plan:   Obstructive sleep apnea Well controlled on  CPAP  Pressure setting uncomfortable since wt loss  Will decrease and check download   Plan  Patient Instructions  Decrease CPAP pressure to 8cm H20 .  CPAP download in 4 weeks .  Continue on CPAP At bedtime   Great job on weight loss. follow up Dr. Vassie Loll  In 6 months and As needed       OBESITY, MORBID S/p gastric bypass with great success on weight loss Cont wt loss to goal weight .  Once reach goal weight consider repeat HST .      Rubye Oaks, NP 12/04/2016

## 2016-12-04 NOTE — Assessment & Plan Note (Signed)
Well controlled on CPAP  Pressure setting uncomfortable since wt loss  Will decrease and check download   Plan  Patient Instructions  Decrease CPAP pressure to 8cm H20 .  CPAP download in 4 weeks .  Continue on CPAP At bedtime   Great job on weight loss. follow up Dr. Vassie LollAlva  In 6 months and As needed

## 2016-12-04 NOTE — Patient Instructions (Signed)
Decrease CPAP pressure to 8cm H20 .  CPAP download in 4 weeks .  Continue on CPAP At bedtime   Great job on weight loss. follow up Dr. Vassie LollAlva  In 6 months and As needed

## 2016-12-04 NOTE — Assessment & Plan Note (Addendum)
S/p gastric bypass with great success on weight loss Cont wt loss to goal weight .  Once reach goal weight consider repeat HST .

## 2016-12-05 NOTE — Progress Notes (Signed)
Reviewed & agree with plan  

## 2016-12-07 ENCOUNTER — Encounter: Payer: Self-pay | Admitting: Family Medicine

## 2016-12-08 MED ORDER — LISINOPRIL 20 MG PO TABS: 20.0000 mg | ORAL_TABLET | Freq: Every day | ORAL | 1 refills | Status: DC

## 2016-12-11 ENCOUNTER — Encounter: Payer: Self-pay | Admitting: Adult Health

## 2016-12-11 DIAGNOSIS — G4733 Obstructive sleep apnea (adult) (pediatric): Secondary | ICD-10-CM

## 2016-12-11 NOTE — Telephone Encounter (Signed)
[name],   [name] last you saw you on 12/04/16 for a OSA follow up. An order was placed for her CPAP pressure to be decreased to 8cm. Per her MyChart message, [facility] and it is "almost just right". However, she feels that the 8 is a little too low and wants to have it increased to 9cm if possible.   Please advise if it is ok for us to send in a pressure increase order to [facility]. Thanks!

## 2016-12-16 NOTE — Telephone Encounter (Signed)
Parrett, Virgel Bouquetammy S, NP   5:39 PM  Can you change her CPAP to 9 cm H20 .  Please set up a download in 4 weeks.   [name]      E-mail sent to patient with TP's recommendations Order placed for pressure change and download in 4 weeks Nothing further needed; will sign off

## 2016-12-16 NOTE — Progress Notes (Addendum)
46 y.o. [id number] MarriedCaucasianF here for annual exam.  Ad roux-en-y bypass surgery.  Pre-weight surgery was #359.  Still seeing pulmonologist and does still have diagnosis of OSA.  Will have another sleep study after at least one year from surgery.  Reports she feels so good.  Stopped HRT with surgery and has not restarted.    Denies vaginal bleeding.   Has come off several medications.  Will likely stop cholesterol medications in November.    Patient's last menstrual period was 01/25/2008.          Sexually active: Yes.    The current method of family planning is status post hysterectomy.    Exercising: Yes.    walking, machines Smoker:  no  Health Maintenance: Pap:  2009 normal  History of abnormal Pap:  no MMG:  11/10/16 BIRADS 1 negative  Colonoscopy:  never BMD:   never TDaP:  10/29/07  Pneumonia vaccine(s):  07/25/01 Zostavax:   never Hep C testing: not indicated  Screening Labs: PCP, Hb today: PCP   reports that she has never smoked. She has never used smokeless tobacco. She reports that she does not drink alcohol or use drugs.  Past Medical History:  Diagnosis Date  . Allergy   . Asthma    pt stated treated as ashtma but not really asthma  . Bipolar disorder (HCC)   . Chicken pox   . Depression   . Diabetes mellitus   . GERD (gastroesophageal reflux disease)   . Hyperlipidemia   . Hypertension    readings  . Irregular heartbeat   . Migraine   . Sleep apnea     Past Surgical History:  Procedure Laterality Date  . ANKLE SURGERY  1989   left  . BACK SURGERY  1999  . CESAREAN SECTION     1995  . LAPAROSCOPIC ASSISTED VAGINAL HYSTERECTOMY  2009     BSO fibroids, DUB, pelvic pain  . 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 OOPHORECTOMY Bilateral 2009   cyst  .  THYROIDECTOMY, PARTIAL  2001   removed left  . UMBILICAL HERNIA REPAIR  2003    Current Outpatient Prescriptions  Medication Sig Dispense Refill  . ARIPiprazole (ABILIFY) 10 MG tablet Take 10 mg by mouth once.   1  . atorvastatin (LIPITOR) 40 MG tablet TAKE 1 TABLET BY MOUTH DAILY 90 tablet 1  . B-D UF III MINI PEN NEEDLES 31G X 5 MM MISC USE TO INJECT INSULIN 2 TIMES DAILY 180 each 3  . Blood Glucose Monitoring Suppl (FREESTYLE LITE) DEVI Use to check sugar 2 times daily 1 each 0  . carbamazepine (TEGRETOL XR) 200 MG 12 hr tablet 1 tablet in the morning, 2 at night    . clobetasol ointment (TEMOVATE) 0.05 % Apply 1 application topically 2 (two) times daily. Do not use for more than 7 days 60 g 1  . fenofibrate 160 MG tablet TAKE 1 TABLET BY MOUTH DAILY 90 tablet 1  . fluticasone (FLONASE) 50 MCG/ACT nasal spray Place 2 sprays into both nostrils daily. (Patient taking differently: Place 2 sprays into both nostrils daily as needed for allergies or rhinitis. ) 16 g 2  . gabapentin (NEURONTIN) 400 MG capsule Take 400 mg by mouth 3 (three) times daily.   1  . glucose blood (FREESTYLE  LITE) test strip Use as instructed to check sugar 2 times daily 200 each 5  . lamoTRIgine (LAMICTAL) 200 MG tablet Take 200 mg by mouth 2 (two) times daily.    . Lancets (FREESTYLE) lancets Use as instructed to check sugar 2 times daily 200 each 5  . lisinopril (PRINIVIL,ZESTRIL) 20 MG tablet Take 1 tablet (20 mg total) by mouth daily. 90 tablet 1  . metFORMIN (GLUCOPHAGE) 500 MG tablet Take 1 tablet (500 mg total) by mouth 2 (two) times daily with a meal. 180 tablet 3  . omeprazole (PRILOSEC) 40 MG capsule TAKE 1 CAPSULE BY MOUTH TWICE A DAY 60 capsule 5  . PRESCRIPTION MEDICATION Bariatric advantage calcium supplement and multivitamin    . QVAR 80 MCG/ACT inhaler INHALE 2 PUFFS BY MOUTH INTO THE LUNGS 2 (TWO) TIMES DAILY. 1 Inhaler 6  . ranitidine (ZANTAC) 300 MG tablet TAKE 1 TABLET (300 MG TOTAL) BY MOUTH AT  BEDTIME. 90 tablet 1  . tiaGABine (GABITRIL) 4 MG tablet Take 8 mg by mouth 2 (two) times daily.    . VENTOLIN HFA 108 (90 Base) MCG/ACT inhaler INHALE 2 PUFFS BY MOUTH INTO THE LUNGS EVERY 4 HOURS AS NEEDED FOR WHEEZING OR SHORTNESS OF BREATH 18 g 3   No current facility-administered medications for this visit.     Family History  Problem Relation Age of Onset  . Hyperlipidemia Mother   . Diabetes Mother   . Anxiety disorder Mother   . Heart disease Mother   . Hypertension Father   . Lupus Father   . Heart disease Father   . Stroke Maternal Aunt   . Stroke Paternal Grandfather   . Mental illness Paternal Grandfather   . Breast cancer Neg Hx     ROS:  Pertinent items are noted in HPI.  Otherwise, a comprehensive ROS was negative.  Exam:   BP 126/70 (BP Location: Right Arm, Patient Position: Sitting, Cuff Size: Large)   Pulse 62   Resp 14   Ht 5\' 8"  (1.727 m)   Wt 249 lb 8 oz (113.2 kg)   LMP 01/25/2008   BMI 37.94 kg/m   Weight change: -103#  Height: 5\' 8"  (172.7 cm)  Ht Readings from Last 3 Encounters:  12/18/16 5\' 8"  (1.727 m)  12/04/16 5\' 9"  (1.753 m)  11/06/16 5\' 9"  (1.753 m)    General appearance: alert, cooperative and appears stated age Head: Normocephalic, without obvious abnormality, atraumatic Neck: no adenopathy, supple, symmetrical, trachea midline and thyroid normal to inspection and palpation Lungs: clear to auscultation bilaterally Breasts: normal appearance, no masses or tenderness Heart: regular rate and rhythm Abdomen: soft, non-tender; bowel sounds normal; no masses,  no organomegaly Extremities: extremities normal, atraumatic, no cyanosis or edema Skin: Skin color, texture, turgor normal. No rashes or lesions Lymph nodes: Cervical, supraclavicular, and axillary nodes normal. No abnormal inguinal nodes palpated Neurologic: Grossly normal   Pelvic: External genitalia:  no lesions              Urethra:  normal appearing urethra with no masses,  tenderness or lesions              Bartholins and Skenes: normal                 Vagina: normal appearing vagina with normal color and discharge, no lesions              Cervix: absent  Pap taken: No. Bimanual Exam:  Uterus:  uterus absent              Adnexa: normal adnexa and no mass, fullness, tenderness               Rectovaginal: Confirms               Anus:  normal sphincter tone, no lesions  Chaperone was present for exam.  A:  Well Woman with normal exam TLH/BSO 9/09 due to fibroids, DUB, pelvic pain H/O gastric bypass 11/17 Off HRT Hypertension Diabetes, on no insulin now and half dose of metformin Elevated lipids but off medication H/o bipolar d/o OSA H/O migraines Vulvar itching with negative biopsy 2015  P:   Mammogram guidelines reviewed.  UTD. New colonoscopy guidelines reviewed.  Will likely do referral next year as this is a brand new guidelines pap smear not indicated return annually or prn

## 2016-12-17 DIAGNOSIS — F319 Bipolar disorder, unspecified: Secondary | ICD-10-CM | POA: Diagnosis not present

## 2016-12-18 ENCOUNTER — Encounter: Payer: Self-pay | Admitting: Obstetrics & Gynecology

## 2016-12-18 ENCOUNTER — Ambulatory Visit (INDEPENDENT_AMBULATORY_CARE_PROVIDER_SITE_OTHER): Payer: 59 | Admitting: Obstetrics & Gynecology

## 2016-12-18 VITALS — BP 126/70 | HR 62 | Resp 14 | Ht 68.0 in | Wt 249.5 lb

## 2016-12-18 DIAGNOSIS — F3162 Bipolar disorder, current episode mixed, moderate: Secondary | ICD-10-CM | POA: Diagnosis not present

## 2016-12-18 DIAGNOSIS — Z01419 Encounter for gynecological examination (general) (routine) without abnormal findings: Secondary | ICD-10-CM | POA: Diagnosis not present

## 2016-12-19 MED FILL — GABAPENTIN 600 MG TABLET: 600 | 90 days supply | Qty: 270 | Fill #0

## 2016-12-22 NOTE — H&P (Signed)
[name] is an 46 y.o. female.    Chief Complaint: right shoulder pain  HPI: Pt is a 46 y.o. female complaining of right shoulder pain for multiple years. Pain had continually increased since the beginning. X-rays in the clinic show cuff tear right shoulder. Pt has tried various conservative treatments which have failed to alleviate their symptoms, including injections and therapy. Various options are discussed with the patient. Risks, benefits and expectations were discussed with the patient. Patient understand the risks, benefits and expectations and wishes to proceed with surgery.   PCP:  Sheliah Hatchabori, Katherine E, MD  D/C Plans: Home  PMH: Past Medical History:  Diagnosis Date  . Allergy   . Asthma    pt stated treated as ashtma but not really asthma  . Bipolar disorder (HCC)   . Chicken pox   . Depression   . Diabetes mellitus   . GERD (gastroesophageal reflux disease)   . Hyperlipidemia   . Hypertension    readings  . Irregular heartbeat   . Migraine   . Sleep apnea     PSH: Past Surgical History:  Procedure Laterality Date  . ANKLE SURGERY  1989   left  . BACK SURGERY  1999  . CESAREAN SECTION     1995  . LAPAROSCOPIC ASSISTED VAGINAL HYSTERECTOMY  2009     BSO fibroids, DUB, pelvic pain  . 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 THYROIDECTOMY, PARTIAL  2001   removed left  . UMBILICAL HERNIA REPAIR  2003    Social History:  reports that she has never smoked. She has never used smokeless tobacco. She reports that she does not drink alcohol or use drugs.  Allergies:  Allergies  Allergen Reactions  . Zofran [Ondansetron Hcl] Other (See Comments)    Severe headache    Medications: No current facility-administered medications for this encounter.    Current  Outpatient Prescriptions  Medication Sig Dispense Refill  . ARIPiprazole (ABILIFY) 10 MG tablet Take 10 mg by mouth once.   1  . atorvastatin (LIPITOR) 40 MG tablet TAKE 1 TABLET BY MOUTH DAILY 90 tablet 1  . B-D UF III MINI PEN NEEDLES 31G X 5 MM MISC USE TO INJECT INSULIN 2 TIMES DAILY 180 each 3  . Blood Glucose Monitoring Suppl (FREESTYLE LITE) DEVI Use to check sugar 2 times daily 1 each 0  . carbamazepine (TEGRETOL XR) 200 MG 12 hr tablet 1 tablet in the morning, 2 at night    . clobetasol ointment (TEMOVATE) 0.05 % Apply 1 application topically 2 (two) times daily. Do not use for more than 7 days 60 g 1  . fenofibrate 160 MG tablet TAKE 1 TABLET BY MOUTH DAILY 90 tablet 1  . fluticasone (FLONASE) 50 MCG/ACT nasal spray Place 2 sprays into both nostrils daily. (Patient taking differently: Place 2 sprays into both nostrils daily as needed for allergies or rhinitis. ) 16 g 2  . gabapentin (NEURONTIN) 400 MG capsule Take 400 mg by mouth 3 (three) times daily.   1  . glucose blood (FREESTYLE LITE) test strip Use as instructed to check sugar 2 times daily 200 each 5  . lamoTRIgine (LAMICTAL) 200 MG tablet Take 200 mg by mouth 2 (two) times daily.    . Lancets (FREESTYLE) lancets Use as instructed to check  sugar 2 times daily 200 each 5  . lisinopril (PRINIVIL,ZESTRIL) 20 MG tablet Take 1 tablet (20 mg total) by mouth daily. 90 tablet 1  . metFORMIN (GLUCOPHAGE) 500 MG tablet Take 1 tablet (500 mg total) by mouth 2 (two) times daily with a meal. 180 tablet 3  . omeprazole (PRILOSEC) 40 MG capsule TAKE 1 CAPSULE BY MOUTH TWICE A DAY 60 capsule 5  . PRESCRIPTION MEDICATION Bariatric advantage calcium supplement and multivitamin    . QVAR 80 MCG/ACT inhaler INHALE 2 PUFFS BY MOUTH INTO THE LUNGS 2 (TWO) TIMES DAILY. 1 Inhaler 6  . ranitidine (ZANTAC) 300 MG tablet TAKE 1 TABLET (300 MG TOTAL) BY MOUTH AT BEDTIME. 90 tablet 1  . tiaGABine (GABITRIL) 4 MG tablet Take 8 mg by mouth 2 (two) times daily.     . VENTOLIN HFA 108 (90 Base) MCG/ACT inhaler INHALE 2 PUFFS BY MOUTH INTO THE LUNGS EVERY 4 HOURS AS NEEDED FOR WHEEZING OR SHORTNESS OF BREATH 18 g 3    No results found for this or any previous visit (from the past 48 hour(s)). No results found.  ROS: Pain with rom of the right upper extremity  Physical Exam:  Alert and oriented 46 y.o. female in no acute distress Cranial nerves 2-12 intact Cervical spine: full rom with no tenderness, nv intact distally Chest: active breath sounds bilaterally, no wheeze rhonchi or rales Heart: regular rate and rhythm, no murmur Abd: non tender non distended with active bowel sounds Hip is stable with rom  Right shoulder with mild limitation with rom nv intact distally Weakness with ER testing  No rashes or edema  Assessment/Plan Assessment: right shoulder cuff tear  Plan: Patient will undergo a right shoulder arthroscopy and cuff repair [facility] Risks benefits and expectations were discussed with the patient. Patient understand risks, benefits and expectations and wishes to proceed.

## 2016-12-29 NOTE — Pre-Procedure Instructions (Signed)
[name]  12/29/2016      [facility] [place], [place] [id number] [place] [address] Phone: [phone] Fax: [phone]    Your procedure is scheduled on August 10  [facility].M[facility]  Call this number if you have problems the morning of surgery:  [phone]   Remember:  Do not eat food or drink liquids after midnight.  Continue all other medications as directed by your physician except follow these instructions about you medications   Take these medicines the morning of surgery with A SIP OF WATER ARIPiprazole (ABILIFY),  fluticasone (FLONASE), gabapentin (NEURONTIN), lamoTRIgine (LAMICTAL), omeprazole (PRILOSEC), QVAR , tiaGABine (GABITRIL), VENTOLIN HFA  7 days prior to surgery STOP taking any Aspirin, Aleve, Naproxen, Ibuprofen, Motrin, Advil, Goody's, BC's, all herbal medications, fish oil, and all vitamins  WHAT DO I DO ABOUT MY DIABETES MEDICATION?   [name]. Do not take oral diabetes medicines (pills) the morning of surgery.  metFORMIN (GLUCOPHAGE)   . THE NIGHT BEFORE SURGERY, take ___________ units of ___________insulin.       [name]. HE MORNING OF SURGERY, take _____________ units of __________insulin.  . The day of surgery, do not take other diabetes injectables, including Byetta (exenatide), Bydureon (exenatide ER), Victoza (liraglutide), or Trulicity (dulaglutide).  . If your CBG is greater than 220 mg/dL, you may take  of your sliding scale (correction) dose of insulin.   How to Manage Your Diabetes Before and After Surgery  Why is it important to control my blood sugar before and after surgery? . Improving blood sugar levels before and after surgery helps healing and can limit problems. . A way of improving blood sugar control is eating a healthy diet by: o  Eating less sugar and carbohydrates o  Increasing activity/exercise o  Talking with your  doctor about reaching your blood sugar goals . High blood sugars (greater than 180 mg/dL) can raise your risk of infections and slow your recovery, so you will need to focus on controlling your diabetes during the weeks before surgery. . Make sure that the doctor who takes care of your diabetes knows about your planned surgery including the date and location.  How do I manage my blood sugar before surgery? . Check your blood sugar at least 4 times a day, starting 2 days before surgery, to make sure that the level is not too high or low. o Check your blood sugar the morning of your surgery when you wake up and every 2 hours until you get to the Short Stay unit. . If your blood sugar is less than 70 mg/dL, you will need to treat for low blood sugar: o Do not take insulin. o Treat a low blood sugar (less than 70 mg/dL) with  cup of clear juice (cranberry or apple), 4 glucose tablets, OR glucose gel. o Recheck blood sugar in 15 minutes after treatment (to make sure it is greater than 70 mg/dL). If your blood sugar is not greater than 70 mg/dL on recheck, call [phone] for further instructions. . Report your blood sugar to the short stay nurse when you get to Short Stay.  . If you are admitted to the hospital after surgery: o Your blood sugar will be checked by the staff and you will probably be given insulin after surgery (instead of oral diabetes medicines) to make sure you have good blood sugar levels.  o The goal for blood sugar control after surgery is 80-180 mg/dL    Do not wear jewelry, make-up or nail polish.  Do not wear lotions, powders, or perfumes, or deoderant.  Do not shave 48 hours prior to surgery.    Do not bring valuables to the hospital.  [facility] is not responsible for any belongings or valuables.  Contacts, dentures or bridgework may not be worn into surgery.  Leave your suitcase in the car.  After surgery it may be brought to your room.  For patients admitted to the  hospital, discharge time will be determined by your treatment team.  Patients discharged the day of surgery will not be allowed to drive home.    Special instructions:   - Preparing For Surgery  Before surgery, you can play an important role. Because skin is not sterile, your skin needs to be as free of germs as possible. You can reduce the number of germs on your skin by washing with CHG (chlorahexidine gluconate) Soap before surgery.  CHG is an antiseptic cleaner which kills germs and bonds with the skin to continue killing germs even after washing.  Please do not use if you have an allergy to CHG or antibacterial soaps. If your skin becomes reddened/irritated stop using the CHG.  Do not shave (including legs and underarms) for at least 48 hours prior to first CHG shower. It is OK to shave your face.  Please follow these instructions carefully.   1. Shower the NIGHT BEFORE SURGERY and the MORNING OF SURGERY with CHG.   2. If you chose to wash your hair, wash your hair first as usual with your normal shampoo.  3. After you shampoo, rinse your hair and body thoroughly to remove the shampoo.  4. Use CHG as you would any other liquid soap. You can apply CHG directly to the skin and wash gently with a scrungie or a clean washcloth.   5. Apply the CHG Soap to your body ONLY FROM THE NECK DOWN.  Do not use on open wounds or open sores. Avoid contact with your eyes, ears, mouth and genitals (private parts). Wash genitals (private parts) with your normal soap.  6. Wash thoroughly, paying special attention to the area where your surgery will be performed.  7. Thoroughly rinse your body with warm water from the neck down.  8. DO NOT shower/wash with your normal soap after using and rinsing off the CHG Soap.  9. Pat yourself dry with a CLEAN TOWEL.   10. Wear CLEAN PAJAMAS   11. Place CLEAN SHEETS on your bed the night of your first shower and DO NOT SLEEP WITH PETS.    Day of  Surgery: Do not apply any deodorants/lotions. Please wear clean clothes to the hospital/surgery center.      Please read over the following fact sheets that you were given.

## 2016-12-30 ENCOUNTER — Encounter (HOSPITAL_COMMUNITY)
Admission: RE | Admit: 2016-12-30 | Discharge: 2016-12-30 | Disposition: A | Discharge status: Home / Self Care | Payer: 59 | Source: Ambulatory Visit | Attending: Orthopedic Surgery | Admitting: Orthopedic Surgery

## 2016-12-30 ENCOUNTER — Encounter (HOSPITAL_COMMUNITY): Payer: Self-pay

## 2016-12-30 DIAGNOSIS — G4733 Obstructive sleep apnea (adult) (pediatric): Secondary | ICD-10-CM

## 2016-12-30 DIAGNOSIS — F329 Major depressive disorder, single episode, unspecified: Secondary | ICD-10-CM

## 2016-12-30 DIAGNOSIS — K219 Gastro-esophageal reflux disease without esophagitis: Secondary | ICD-10-CM | POA: Insufficient documentation

## 2016-12-30 DIAGNOSIS — M25711 Osteophyte, right shoulder: Secondary | ICD-10-CM | POA: Diagnosis not present

## 2016-12-30 DIAGNOSIS — Z6837 Body mass index (BMI) 37.0-37.9, adult: Secondary | ICD-10-CM | POA: Insufficient documentation

## 2016-12-30 DIAGNOSIS — J45909 Unspecified asthma, uncomplicated: Secondary | ICD-10-CM

## 2016-12-30 DIAGNOSIS — G43909 Migraine, unspecified, not intractable, without status migrainosus: Secondary | ICD-10-CM | POA: Insufficient documentation

## 2016-12-30 DIAGNOSIS — E119 Type 2 diabetes mellitus without complications: Secondary | ICD-10-CM

## 2016-12-30 DIAGNOSIS — G4730 Sleep apnea, unspecified: Secondary | ICD-10-CM | POA: Diagnosis not present

## 2016-12-30 DIAGNOSIS — I10 Essential (primary) hypertension: Secondary | ICD-10-CM | POA: Insufficient documentation

## 2016-12-30 DIAGNOSIS — Z9889 Other specified postprocedural states: Secondary | ICD-10-CM | POA: Insufficient documentation

## 2016-12-30 DIAGNOSIS — Z9884 Bariatric surgery status: Secondary | ICD-10-CM

## 2016-12-30 DIAGNOSIS — Z794 Long term (current) use of insulin: Secondary | ICD-10-CM | POA: Diagnosis not present

## 2016-12-30 DIAGNOSIS — M25511 Pain in right shoulder: Secondary | ICD-10-CM | POA: Diagnosis not present

## 2016-12-30 DIAGNOSIS — Z01812 Encounter for preprocedural laboratory examination: Secondary | ICD-10-CM | POA: Insufficient documentation

## 2016-12-30 DIAGNOSIS — M19011 Primary osteoarthritis, right shoulder: Secondary | ICD-10-CM | POA: Diagnosis not present

## 2016-12-30 DIAGNOSIS — E785 Hyperlipidemia, unspecified: Secondary | ICD-10-CM | POA: Diagnosis not present

## 2016-12-30 DIAGNOSIS — I499 Cardiac arrhythmia, unspecified: Secondary | ICD-10-CM | POA: Insufficient documentation

## 2016-12-30 DIAGNOSIS — M94211 Chondromalacia, right shoulder: Secondary | ICD-10-CM | POA: Diagnosis not present

## 2016-12-30 DIAGNOSIS — X58XXXA Exposure to other specified factors, initial encounter: Secondary | ICD-10-CM | POA: Diagnosis not present

## 2016-12-30 DIAGNOSIS — E669 Obesity, unspecified: Secondary | ICD-10-CM

## 2016-12-30 DIAGNOSIS — F319 Bipolar disorder, unspecified: Secondary | ICD-10-CM | POA: Insufficient documentation

## 2016-12-30 DIAGNOSIS — Z9989 Dependence on other enabling machines and devices: Secondary | ICD-10-CM | POA: Diagnosis not present

## 2016-12-30 DIAGNOSIS — Z7951 Long term (current) use of inhaled steroids: Secondary | ICD-10-CM | POA: Diagnosis not present

## 2016-12-30 DIAGNOSIS — Z79899 Other long term (current) drug therapy: Secondary | ICD-10-CM | POA: Diagnosis not present

## 2016-12-30 DIAGNOSIS — S43431A Superior glenoid labrum lesion of right shoulder, initial encounter: Secondary | ICD-10-CM | POA: Diagnosis not present

## 2016-12-30 DIAGNOSIS — M75121 Complete rotator cuff tear or rupture of right shoulder, not specified as traumatic: Secondary | ICD-10-CM | POA: Diagnosis not present

## 2016-12-30 HISTORY — DX: Dyspnea, unspecified: R06.00

## 2016-12-30 HISTORY — DX: Adverse effect of unspecified anesthetic, initial encounter: T41.45XA

## 2016-12-30 HISTORY — DX: Hypothyroidism, unspecified: E03.9

## 2016-12-30 HISTORY — DX: Other complications of anesthesia, initial encounter: T88.59XA

## 2016-12-30 LAB — CBC
HEMATOCRIT: 37.7 % (ref 36.0–46.0)
Hemoglobin: 12.2 g/dL (ref 12.0–15.0)
MCH: 28.5 pg (ref 26.0–34.0)
MCHC: 32.4 g/dL (ref 30.0–36.0)
MCV: 88.1 fL (ref 78.0–100.0)
Platelets: 245 10*3/uL (ref 150–400)
RBC: 4.28 MIL/uL (ref 3.87–5.11)
RDW: 13.3 % (ref 11.5–15.5)
WBC: 7.8 10*3/uL (ref 4.0–10.5)

## 2016-12-30 LAB — BASIC METABOLIC PANEL
Anion gap: 9 (ref 5–15)
BUN: 11 mg/dL (ref 6–20)
CHLORIDE: 102 mmol/L (ref 101–111)
CO2: 28 mmol/L (ref 22–32)
Calcium: 9.3 mg/dL (ref 8.9–10.3)
Creatinine, Ser: 0.51 mg/dL (ref 0.44–1.00)
GFR calc non Af Amer: >60 mL/min (ref >60)
Glucose, Bld: 112 mg/dL — ABNORMAL HIGH (ref 65–99)
Potassium: 4.1 mmol/L (ref 3.5–5.1)
Sodium: 139 mmol/L (ref 135–145)

## 2016-12-30 LAB — GLUCOSE, CAPILLARY: Glucose-Capillary: 103 mg/dL — ABNORMAL HIGH (ref 65–99)

## 2016-12-30 NOTE — Progress Notes (Signed)
PCP - Neena RhymesKatherine Tabori  Cardiologist - Saw Dr. Myrtis SerKatz 10 years ago she did see someone 5 years ago but cannot remember who  Chest x-ray - 02/29/16 EKG - 02/29/16 Stress Test - denies ECHO - 2008 Cardiac Cath - denies  Sleep Study - 10 years ago CPAP - wears at night setting at a 9 - instructed to bring mask and tubing with her the morning of surgery  Fasting Blood Sugar - 90-110 Checks Blood Sugar __2___ times a day  Sending to anesthesia for review of heart history and a1c results   Patient denies shortness of breath, fever, cough and chest pain at PAT appointment   Patient verbalized understanding of instructions that were given to them at the PAT appointment. Patient was also instructed that they will need to review over the PAT instructions again at home before surgery.

## 2016-12-31 LAB — HEMOGLOBIN A1C
Hgb A1c MFr Bld: 6.6 % — ABNORMAL HIGH (ref 4.8–5.6)
Mean Plasma Glucose: 143 mg/dL

## 2016-12-31 NOTE — Progress Notes (Signed)
Anesthesia Chart Review: Patient is a 46 year old female scheduled for right shoulder arthroscopy, subacromial decompression, open biceps tenodesis, open rotator cuff repair, open distal clavicle excision on 01/02/2017 by Dr. Esmond Plants.  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 BMI is consistent with obesity.   - PCP is Dr. Annye Asa.  - Endocrinologist is Dr. Philemon Kingdom. - Pulmonologist is Dr. Kara Mead (CPAP). - She is not followed routinely by cardiology by saw Dr. Dola Argyle 2008-2011 for SOB, palpitations, and sinus tachycardia in the setting of morbid obesity (WT 356 lb). Echo showed normal LVF, and symptoms improved with Lasix. Weight loss was encouraged. ST treated with metoprolol. She saw Dr. Candee Furbish on 04/13/13 for ED chest pain follow-up. He ordered an ETT which was negative at workload acheived. He felt test was "overall reassuring."  Meds include Abilify, Lipitor, biotin, Tegretol-XR, Flonase, Neurontin, Lamictal, Xyzal, lisinopril, metformin, Qvar, Zantac, tiagabine.   BP 140/72   Pulse (!) 53   Temp 36.5 C   Resp 18   Ht '5\' 9"'  (1.753 m)   Wt 253 lb 6.4 oz (114.9 kg)   LMP 01/25/2008   SpO2 96%   BMI 37.42 kg/m   EKG 02/29/16: NSR.  ETT 05/03/13: Impression: Negative for ischemia GXT with the patient developing significant exercise-induced dyspnea and achieving a 6.5 MET workload and peak heart rate of 150 representing 83% APMHR.   Echo 11/02/06: SUMMARY - Abnormal septal motion Overall left ventricular systolic function    was normal. Left ventricular ejection fraction was  estimated    to be 60 %. There were no left ventricular regional wall    motion abnormalities. - There was trivial aortic valvular regurgitation. - The left atrium was mildly dilated.  CXR 02/29/16: IMPRESSION: No active cardiopulmonary disease.  PFTs 03/28/16: FVC 3.43 (80%), FEV1 2.85 (83%), DLCOunc 23.75 (76%).   Preoperative labs noted. A1c 6.6.   If no acute changes then I anticipate that she can proceed as planned.  George Hugh [facility] Short Stay Center/Anesthesiology Phone [phone] 12/31/2016 10:25 AM

## 2017-01-01 ENCOUNTER — Other Ambulatory Visit: Payer: Self-pay | Admitting: General Practice

## 2017-01-01 MED ORDER — BECLOMETHASONE DIPROP HFA 80 MCG/ACT IN AERB: 2.0000 | INHALATION_SPRAY | Freq: Two times a day (BID) | RESPIRATORY_TRACT | 6 refills | Status: DC

## 2017-01-01 MED ORDER — DEXTROSE 5 % IV SOLN
3.0000 g | INTRAVENOUS | Status: AC
  Administered 2017-01-02: 3 g via INTRAVENOUS
  Filled 2017-01-01: qty 3000

## 2017-01-01 MED FILL — QVAR REDIHALER 80 MCG/ACT A: 80 | 30 days supply | Qty: 11 | Fill #0

## 2017-01-02 ENCOUNTER — Ambulatory Visit (HOSPITAL_COMMUNITY): Payer: 59 | Admitting: Certified Registered Nurse Anesthetist

## 2017-01-02 ENCOUNTER — Encounter (HOSPITAL_COMMUNITY): Payer: Self-pay | Admitting: Certified Registered Nurse Anesthetist

## 2017-01-02 ENCOUNTER — Encounter (HOSPITAL_COMMUNITY)
Admission: RE | Disposition: A | Discharge status: Home / Self Care | Payer: Self-pay | Source: Ambulatory Visit | Attending: Orthopedic Surgery

## 2017-01-02 ENCOUNTER — Ambulatory Visit (HOSPITAL_COMMUNITY): Payer: 59 | Admitting: Vascular Surgery

## 2017-01-02 ENCOUNTER — Ambulatory Visit (HOSPITAL_COMMUNITY)
Admission: RE | Admit: 2017-01-02 | Discharge: 2017-01-02 | Disposition: A | Discharge status: Home / Self Care | Payer: 59 | Source: Ambulatory Visit | Attending: Orthopedic Surgery | Admitting: Orthopedic Surgery

## 2017-01-02 DIAGNOSIS — J45909 Unspecified asthma, uncomplicated: Secondary | ICD-10-CM | POA: Insufficient documentation

## 2017-01-02 DIAGNOSIS — E785 Hyperlipidemia, unspecified: Secondary | ICD-10-CM | POA: Insufficient documentation

## 2017-01-02 DIAGNOSIS — I10 Essential (primary) hypertension: Secondary | ICD-10-CM | POA: Insufficient documentation

## 2017-01-02 DIAGNOSIS — E119 Type 2 diabetes mellitus without complications: Secondary | ICD-10-CM | POA: Insufficient documentation

## 2017-01-02 DIAGNOSIS — M66821 Spontaneous rupture of other tendons, right upper arm: Secondary | ICD-10-CM | POA: Diagnosis not present

## 2017-01-02 DIAGNOSIS — K219 Gastro-esophageal reflux disease without esophagitis: Secondary | ICD-10-CM | POA: Diagnosis not present

## 2017-01-02 DIAGNOSIS — Z7951 Long term (current) use of inhaled steroids: Secondary | ICD-10-CM | POA: Insufficient documentation

## 2017-01-02 DIAGNOSIS — M94211 Chondromalacia, right shoulder: Secondary | ICD-10-CM | POA: Diagnosis not present

## 2017-01-02 DIAGNOSIS — M25711 Osteophyte, right shoulder: Secondary | ICD-10-CM | POA: Diagnosis not present

## 2017-01-02 DIAGNOSIS — G8918 Other acute postprocedural pain: Secondary | ICD-10-CM | POA: Diagnosis not present

## 2017-01-02 DIAGNOSIS — Z9989 Dependence on other enabling machines and devices: Secondary | ICD-10-CM | POA: Insufficient documentation

## 2017-01-02 DIAGNOSIS — Z794 Long term (current) use of insulin: Secondary | ICD-10-CM | POA: Insufficient documentation

## 2017-01-02 DIAGNOSIS — X58XXXA Exposure to other specified factors, initial encounter: Secondary | ICD-10-CM | POA: Insufficient documentation

## 2017-01-02 DIAGNOSIS — S43431A Superior glenoid labrum lesion of right shoulder, initial encounter: Secondary | ICD-10-CM | POA: Diagnosis not present

## 2017-01-02 DIAGNOSIS — M25511 Pain in right shoulder: Secondary | ICD-10-CM | POA: Diagnosis not present

## 2017-01-02 DIAGNOSIS — M75111 Incomplete rotator cuff tear or rupture of right shoulder, not specified as traumatic: Secondary | ICD-10-CM | POA: Diagnosis not present

## 2017-01-02 DIAGNOSIS — Z9884 Bariatric surgery status: Secondary | ICD-10-CM | POA: Insufficient documentation

## 2017-01-02 DIAGNOSIS — Z79899 Other long term (current) drug therapy: Secondary | ICD-10-CM | POA: Insufficient documentation

## 2017-01-02 DIAGNOSIS — M19011 Primary osteoarthritis, right shoulder: Secondary | ICD-10-CM | POA: Insufficient documentation

## 2017-01-02 DIAGNOSIS — M75101 Unspecified rotator cuff tear or rupture of right shoulder, not specified as traumatic: Secondary | ICD-10-CM | POA: Diagnosis not present

## 2017-01-02 DIAGNOSIS — M75121 Complete rotator cuff tear or rupture of right shoulder, not specified as traumatic: Secondary | ICD-10-CM | POA: Insufficient documentation

## 2017-01-02 DIAGNOSIS — G4730 Sleep apnea, unspecified: Secondary | ICD-10-CM | POA: Insufficient documentation

## 2017-01-02 DIAGNOSIS — Z6837 Body mass index (BMI) 37.0-37.9, adult: Secondary | ICD-10-CM | POA: Insufficient documentation

## 2017-01-02 HISTORY — PX: SHOULDER ARTHROSCOPY WITH OPEN ROTATOR CUFF REPAIR: SHX6092

## 2017-01-02 LAB — GLUCOSE, CAPILLARY
GLUCOSE-CAPILLARY: 120 mg/dL — AB (ref 65–99)
Glucose-Capillary: 175 mg/dL — ABNORMAL HIGH (ref 65–99)

## 2017-01-02 SURGERY — ARTHROSCOPY, SHOULDER WITH REPAIR, ROTATOR CUFF, OPEN
Anesthesia: Regional | Site: Shoulder | Laterality: Right

## 2017-01-02 MED ORDER — PROMETHAZINE HCL 25 MG/ML IJ SOLN: 6.2500 mg | INTRAMUSCULAR | Status: DC | PRN

## 2017-01-02 MED ORDER — LIDOCAINE 2% (20 MG/ML) 5 ML SYRINGE
INTRAMUSCULAR | Status: AC
  Filled 2017-01-02: qty 5

## 2017-01-02 MED ORDER — SUCCINYLCHOLINE CHLORIDE 200 MG/10ML IV SOSY
PREFILLED_SYRINGE | INTRAVENOUS | Status: AC
  Filled 2017-01-02: qty 10

## 2017-01-02 MED ORDER — FENTANYL CITRATE (PF) 250 MCG/5ML IJ SOLN
INTRAMUSCULAR | Status: AC
  Filled 2017-01-02: qty 5

## 2017-01-02 MED ORDER — ARTIFICIAL TEARS OPHTHALMIC OINT
TOPICAL_OINTMENT | OPHTHALMIC | Status: AC
  Filled 2017-01-02: qty 3.5

## 2017-01-02 MED ORDER — FENTANYL CITRATE (PF) 100 MCG/2ML IJ SOLN
50.0000 ug | Freq: Once | INTRAMUSCULAR | Status: AC
  Administered 2017-01-02: 50 ug via INTRAVENOUS

## 2017-01-02 MED ORDER — MIDAZOLAM HCL 2 MG/2ML IJ SOLN
INTRAMUSCULAR | Status: DC | PRN
  Administered 2017-01-02 (×2): 1 mg via INTRAVENOUS

## 2017-01-02 MED ORDER — ARTIFICIAL TEARS OPHTHALMIC OINT
TOPICAL_OINTMENT | OPHTHALMIC | Status: DC | PRN
Start: 2017-01-02 — End: 2017-01-02
  Administered 2017-01-02: 1 via OPHTHALMIC

## 2017-01-02 MED ORDER — 0.9 % SODIUM CHLORIDE (POUR BTL) OPTIME
TOPICAL | Status: DC | PRN
  Administered 2017-01-02: 1000 mL

## 2017-01-02 MED ORDER — PHENYLEPHRINE HCL 10 MG/ML IJ SOLN
INTRAVENOUS | Status: DC | PRN
  Administered 2017-01-02: 50 ug/min via INTRAVENOUS

## 2017-01-02 MED ORDER — BUPIVACAINE-EPINEPHRINE (PF) 0.5% -1:200000 IJ SOLN
INTRAMUSCULAR | Status: DC | PRN
  Administered 2017-01-02: 30 mL via PERINEURAL

## 2017-01-02 MED ORDER — SUCCINYLCHOLINE CHLORIDE 20 MG/ML IJ SOLN
INTRAMUSCULAR | Status: DC | PRN
  Administered 2017-01-02: 80 mg via INTRAVENOUS

## 2017-01-02 MED ORDER — FENTANYL CITRATE (PF) 250 MCG/5ML IJ SOLN
INTRAMUSCULAR | Status: DC | PRN
  Administered 2017-01-02: 100 ug via INTRAVENOUS

## 2017-01-02 MED ORDER — MIDAZOLAM HCL 2 MG/2ML IJ SOLN
INTRAMUSCULAR | Status: DC
Start: 2017-01-02 — End: 2017-01-02
  Filled 2017-01-02: qty 2

## 2017-01-02 MED ORDER — CHLORHEXIDINE GLUCONATE 4 % EX LIQD: 60.0000 mL | Freq: Once | CUTANEOUS | Status: DC

## 2017-01-02 MED ORDER — PROPOFOL 10 MG/ML IV BOLUS
INTRAVENOUS | Status: DC | PRN
  Administered 2017-01-02: 120 mg via INTRAVENOUS

## 2017-01-02 MED ORDER — BUPIVACAINE-EPINEPHRINE (PF) 0.25% -1:200000 IJ SOLN
INTRAMUSCULAR | Status: AC
  Filled 2017-01-02: qty 30

## 2017-01-02 MED ORDER — SODIUM CHLORIDE 0.9 % IR SOLN
Status: DC | PRN
  Administered 2017-01-02 (×2): 3000 mL

## 2017-01-02 MED ORDER — OXYCODONE HCL 5 MG/5ML PO SOLN: 5.0000 mg | Freq: Once | ORAL | Status: DC | PRN

## 2017-01-02 MED ORDER — GLYCOPYRROLATE 0.2 MG/ML IJ SOLN
INTRAMUSCULAR | Status: DC | PRN
  Administered 2017-01-02 (×2): 0.2 mg via INTRAVENOUS

## 2017-01-02 MED ORDER — BUPIVACAINE-EPINEPHRINE 0.25% -1:200000 IJ SOLN
INTRAMUSCULAR | Status: DC | PRN
  Administered 2017-01-02: 10 mL

## 2017-01-02 MED ORDER — OXYCODONE-ACETAMINOPHEN 5-325 MG PO TABS: 1.0000 | ORAL_TABLET | ORAL | 0 refills | Status: DC | PRN

## 2017-01-02 MED ORDER — FENTANYL CITRATE (PF) 100 MCG/2ML IJ SOLN
INTRAMUSCULAR | Status: AC
  Filled 2017-01-02: qty 2

## 2017-01-02 MED ORDER — ONDANSETRON HCL 4 MG/2ML IJ SOLN
INTRAMUSCULAR | Status: AC
  Filled 2017-01-02: qty 2

## 2017-01-02 MED ORDER — FENTANYL CITRATE (PF) 100 MCG/2ML IJ SOLN: 25.0000 ug | INTRAMUSCULAR | Status: DC | PRN

## 2017-01-02 MED ORDER — MIDAZOLAM HCL 2 MG/2ML IJ SOLN
INTRAMUSCULAR | Status: AC
  Filled 2017-01-02: qty 2

## 2017-01-02 MED ORDER — OXYCODONE HCL 5 MG PO TABS: 5.0000 mg | ORAL_TABLET | Freq: Once | ORAL | Status: DC | PRN

## 2017-01-02 MED ORDER — PROPOFOL 10 MG/ML IV BOLUS
INTRAVENOUS | Status: AC
  Filled 2017-01-02: qty 20

## 2017-01-02 MED ORDER — MIDAZOLAM HCL 2 MG/2ML IJ SOLN
2.0000 mg | Freq: Once | INTRAMUSCULAR | Status: AC
  Administered 2017-01-02: 2 mg via INTRAVENOUS

## 2017-01-02 MED ORDER — LACTATED RINGERS IV SOLN
INTRAVENOUS | Status: DC
  Administered 2017-01-02 (×2): via INTRAVENOUS

## 2017-01-02 MED ORDER — LIDOCAINE HCL (CARDIAC) 20 MG/ML IV SOLN
INTRAVENOUS | Status: DC | PRN
  Administered 2017-01-02: 60 mg via INTRATRACHEAL

## 2017-01-02 MED ORDER — METHOCARBAMOL 500 MG PO TABS: 500.0000 mg | ORAL_TABLET | Freq: Three times a day (TID) | ORAL | 1 refills | Status: DC | PRN

## 2017-01-02 MED ORDER — STERILE WATER FOR IRRIGATION IR SOLN
Status: DC | PRN
  Administered 2017-01-02: 1000 mL

## 2017-01-02 MED ORDER — DEXAMETHASONE SODIUM PHOSPHATE 10 MG/ML IJ SOLN
INTRAMUSCULAR | Status: DC | PRN
  Administered 2017-01-02: 10 mg via INTRAVENOUS

## 2017-01-02 MED ORDER — DEXAMETHASONE SODIUM PHOSPHATE 10 MG/ML IJ SOLN
INTRAMUSCULAR | Status: AC
  Filled 2017-01-02: qty 1

## 2017-01-02 MED FILL — OXYCODONE-ACETAMINOPHEN 5-3: 5-325 | 5 days supply | Qty: 60 | Fill #0

## 2017-01-02 MED FILL — METHOCARBAMOL 500 MG TABLET: 500 | 20 days supply | Qty: 60 | Fill #0

## 2017-01-02 SURGICAL SUPPLY — 77 items
ANCH SUT 2 1.3X1 LD 1 STRN (Anchor) ×1 IMPLANT
ANCH SUT 360D 2 2 LD 2 STRN (Anchor) ×1 IMPLANT
ANCHOR ALL- SUT RC 2 SUT Y-K (Anchor) ×2 IMPLANT
ANCHOR ALL-SUT FLEX 1.3 Y-KNOT (Anchor) ×2 IMPLANT
ANCHOR ALL-SUT RC 2 SUT Y-K (Anchor) IMPLANT
BIT DRILL 1.3M DISPOSABLE (BIT) ×3 IMPLANT
BIT DRILL YKNOT HARD BONE 1.3 (BIT) ×2 IMPLANT
BLADE LONG MED 31MMX9MM (MISCELLANEOUS) ×1
BLADE LONG MED 31X9 (MISCELLANEOUS) ×1 IMPLANT
BLADE SURG 11 STRL SS (BLADE) ×3 IMPLANT
BLADE W/14.0X25.5MM (BLADE) ×1 IMPLANT
BUR OVAL 4.0 (BURR) ×2 IMPLANT
CLOSURE STERI-STRIP 1/2X4 (GAUZE/BANDAGES/DRESSINGS) ×1
CLOSURE WOUND 1/2 X4 (GAUZE/BANDAGES/DRESSINGS) ×1
CLSR STERI-STRIP ANTIMIC 1/2X4 (GAUZE/BANDAGES/DRESSINGS) ×1 IMPLANT
COVER SURGICAL LIGHT HANDLE (MISCELLANEOUS) ×3 IMPLANT
DRAPE INCISE IOBAN 66X45 STRL (DRAPES) ×3 IMPLANT
DRAPE ORTHO SPLIT 77X108 STRL (DRAPES) ×6
DRAPE STERI 35X30 U-POUCH (DRAPES) ×3 IMPLANT
DRAPE SURG ORHT 6 SPLT 77X108 (DRAPES) IMPLANT
DRAPE U-SHAPE 47X51 STRL (DRAPES) ×3 IMPLANT
DRSG EMULSION OIL 3X3 NADH (GAUZE/BANDAGES/DRESSINGS) ×3 IMPLANT
DRSG PAD ABDOMINAL 8X10 ST (GAUZE/BANDAGES/DRESSINGS) ×6 IMPLANT
DURAPREP 26ML APPLICATOR (WOUND CARE) ×3 IMPLANT
ELECT NDL TIP 2.8 STRL (NEEDLE) ×1 IMPLANT
ELECT NEEDLE TIP 2.8 STRL (NEEDLE) ×3 IMPLANT
ELECT REM PT RETURN 9FT ADLT (ELECTROSURGICAL)
ELECTRODE REM PT RTRN 9FT ADLT (ELECTROSURGICAL) IMPLANT
FLUID NSS /IRRIG 3000 ML XXX (IV SOLUTION) ×4 IMPLANT
GAUZE SPONGE 4X4 12PLY STRL (GAUZE/BANDAGES/DRESSINGS) ×3 IMPLANT
GAUZE SPONGE 4X4 12PLY STRL LF (GAUZE/BANDAGES/DRESSINGS) ×2 IMPLANT
GLOVE BIOGEL PI ORTHO PRO 7.5 (GLOVE) ×2
GLOVE BIOGEL PI ORTHO PRO SZ8 (GLOVE) ×2
GLOVE ORTHO TXT STRL SZ7.5 (GLOVE) ×3 IMPLANT
GLOVE PI ORTHO PRO STRL 7.5 (GLOVE) ×1 IMPLANT
GLOVE PI ORTHO PRO STRL SZ8 (GLOVE) ×1 IMPLANT
GLOVE SURG ORTHO 8.5 STRL (GLOVE) ×3 IMPLANT
GOWN STRL REUS W/ TWL XL LVL3 (GOWN DISPOSABLE) ×4 IMPLANT
GOWN STRL REUS W/TWL XL LVL3 (GOWN DISPOSABLE) ×12
KIT BASIN OR (CUSTOM PROCEDURE TRAY) ×3 IMPLANT
KIT ROOM TURNOVER OR (KITS) ×3 IMPLANT
MANIFOLD NEPTUNE II (INSTRUMENTS) ×3 IMPLANT
NDL SPNL 18GX3.5 QUINCKE PK (NEEDLE) ×1 IMPLANT
NDL SUT .5 MAYO 1.404X.05X (NEEDLE) ×1 IMPLANT
NDL SUT 6 .5 CRC .975X.05 MAYO (NEEDLE) ×1 IMPLANT
NEEDLE HYPO 25GX1X1/2 BEV (NEEDLE) ×3 IMPLANT
NEEDLE MAYO TAPER (NEEDLE) ×6
NEEDLE SPNL 18GX3.5 QUINCKE PK (NEEDLE) ×3 IMPLANT
NS IRRIG 1000ML POUR BTL (IV SOLUTION) ×3 IMPLANT
PACK SHOULDER (CUSTOM PROCEDURE TRAY) ×3 IMPLANT
PAD ABD 8X10 STRL (GAUZE/BANDAGES/DRESSINGS) ×3 IMPLANT
PAD ARMBOARD 7.5X6 YLW CONV (MISCELLANEOUS) ×6 IMPLANT
PROBE BIPOLAR ATHRO 135MM 90D (MISCELLANEOUS) ×2 IMPLANT
RESECTOR FULL RADIUS 4.2MM (BLADE) ×3 IMPLANT
SET ARTHROSCOPY TUBING (MISCELLANEOUS) ×3
SET ARTHROSCOPY TUBING LN (MISCELLANEOUS) ×1 IMPLANT
SLING ARM FOAM STRAP LRG (SOFTGOODS) IMPLANT
SLING ARM FOAM STRAP MED (SOFTGOODS) IMPLANT
SLING ARM IMMOBILIZER LRG (SOFTGOODS) ×3 IMPLANT
STRIP CLOSURE SKIN 1/2X4 (GAUZE/BANDAGES/DRESSINGS) ×2 IMPLANT
SUT BONE WAX W31G (SUTURE) ×3 IMPLANT
SUT FIBERWIRE #2 38 T-5 BLUE (SUTURE)
SUT HI-FI 2 STRAND C-2 40 (SUTURE) ×2 IMPLANT
SUT MNCRL AB 3-0 PS2 18 (SUTURE) ×3 IMPLANT
SUT VIC AB 0 CT1 27 (SUTURE) ×3
SUT VIC AB 0 CT1 27XBRD ANBCTR (SUTURE) ×1 IMPLANT
SUT VIC AB 0 CT2 27 (SUTURE) IMPLANT
SUT VIC AB 2-0 CT1 27 (SUTURE) ×3
SUT VIC AB 2-0 CT1 TAPERPNT 27 (SUTURE) ×1 IMPLANT
SUTURE FIBERWR #2 38 T-5 BLUE (SUTURE) IMPLANT
SYR CONTROL 10ML LL (SYRINGE) ×3 IMPLANT
TOWEL OR 17X24 6PK STRL BLUE (TOWEL DISPOSABLE) ×3 IMPLANT
TOWEL OR 17X26 10 PK STRL BLUE (TOWEL DISPOSABLE) ×3 IMPLANT
TUBE CONNECTING 12'X1/4 (SUCTIONS) ×1
TUBE CONNECTING 12X1/4 (SUCTIONS) ×2 IMPLANT
WAND HAND CNTRL MULTIVAC 90 (MISCELLANEOUS) ×3 IMPLANT
WATER STERILE IRR 1000ML POUR (IV SOLUTION) ×3 IMPLANT

## 2017-01-02 NOTE — Progress Notes (Signed)
Orthopedic Tech Progress Note Patient Details:  [name] Nov 25, 1970 [id number]  Ortho Devices Type of Ortho Device: Abduction pillow Ortho Device/Splint Location: Dropped off Shoulder Aduction pillow as requested by OR staff.   [name], [name] 01/02/2017, 11:42 AM

## 2017-01-02 NOTE — Interval H&P Note (Signed)
History and Physical Interval Note:  01/02/2017 10:32 AM  [name]  has presented today for surgery, with the diagnosis of Right shoulder Rotator cuff tear and SLAP  The various methods of treatment have been discussed with the patient and family. After consideration of risks, benefits and other options for treatment, the patient has consented to  Procedure(s): Right shoulder arthroscopy, A-subcromial decompression, mini open rotator cuff repair, open distal clavicle resection, biceps tenodesis (Right) as a surgical intervention .  The patient's history has been reviewed, patient examined, no change in status, stable for surgery.  I have reviewed the patient's chart and labs.  Questions were answered to the patient's satisfaction.     Woodie Degraffenreid,STEVEN R

## 2017-01-02 NOTE — Anesthesia Postprocedure Evaluation (Signed)
Anesthesia Post Note  Patient: Kynslie Frumkin  Procedure(s) Performed: Procedure(s) (LRB): Right shoulder arthroscopy, A-subcromial decompression, mini open rotator cuff repair, open distal clavicle resection, biceps tenodesis (Right)     Patient location during evaluation: PACU Anesthesia Type: Regional and General Level of consciousness: awake and alert Pain management: pain level controlled Vital Signs Assessment: post-procedure vital signs reviewed and stable Respiratory status: spontaneous breathing, nonlabored ventilation, respiratory function stable and patient connected to nasal cannula oxygen Cardiovascular status: blood pressure returned to baseline and stable Postop Assessment: no signs of nausea or vomiting Anesthetic complications: no    Last Vitals:  Vitals:   01/02/17 1318 01/02/17 1332  BP: (!) 145/74 (!) 147/77  Pulse: 86 86  Resp: 18 17  Temp:  36.7 C  SpO2: 95% 94%    Last Pain:  Vitals:   01/02/17 0853  TempSrc: Oral  PainSc:                  Zymarion Favorite

## 2017-01-02 NOTE — Transfer of Care (Signed)
Immediate Anesthesia Transfer of Care Note  Patient: [name]  Procedure(s) Performed: Procedure(s): Right shoulder arthroscopy, A-subcromial decompression, mini open rotator cuff repair, open distal clavicle resection, biceps tenodesis (Right)  Patient Location: PACU  Anesthesia Type:General and Regional  Level of Consciousness: awake, alert  and patient cooperative  Airway & Oxygen Therapy: Patient Spontanous Breathing and Patient connected to face mask oxygen  Post-op Assessment: Report given to RN, Post -op Vital signs reviewed and stable, Patient moving all extremities X 4 and Patient able to stick tongue midline  Post vital signs: Reviewed and stable  Last Vitals:  Vitals:   01/02/17 1015 01/02/17 1248  BP: (!) 126/50   Pulse: 72   Resp: (!) 24   Temp:  (P) 36.7 C  SpO2: 96%     Last Pain:  Vitals:   01/02/17 0853  TempSrc: Oral  PainSc:       Patients Stated Pain Goal: 4 (01/02/17 0843)  Complications: No apparent anesthesia complications

## 2017-01-02 NOTE — Anesthesia Procedure Notes (Signed)
Procedure Name: Intubation Date/Time: 01/02/2017 10:58 AM Performed by: Oleta Mouse Pre-anesthesia Checklist: Patient identified, Emergency Drugs available, Suction available and Patient being monitored Patient Re-evaluated:Patient Re-evaluated prior to induction Oxygen Delivery Method: Circle system utilized Preoxygenation: Pre-oxygenation with 100% oxygen Induction Type: IV induction Ventilation: Mask ventilation without difficulty Laryngoscope Size: Mac and 3 Grade View: Grade I Tube type: Oral Tube size: 7.0 mm Number of attempts: 1 Airway Equipment and Method: Stylet Placement Confirmation: ETT inserted through vocal cords under direct vision,  positive ETCO2 and breath sounds checked- equal and bilateral Secured at: 22 cm Tube secured with: Tape Dental Injury: Teeth and Oropharynx as per pre-operative assessment

## 2017-01-02 NOTE — Discharge Instructions (Signed)
Ice to the shoulder as much as possible.  Keep the incision clean and dry and covered for one week, then ok to get showered.   Wear the pillow sling while up and walking around and for sleep at night.  May remove while seated.  Lean to the right and slide the sling out, then slide a pillow under you arm to rest between your body and your arm.  "hug a pillow"  Do the following exercises every hour while awake for 5 minutes  Pendulums - lean to the right and dangle your arm in circles Pillow slides - with your hand and forearm resting on the pillow, slide your hand to the knee and then back to your hip Hug and Hitch hike - with arm against pillow rotate palm into your stomach and then turn your thumb out and rotate out away from you as tolerated.  Follow up in the office in two weeks with Dr Ranell PatrickNorris 336 [phone]

## 2017-01-02 NOTE — Anesthesia Preprocedure Evaluation (Signed)
Anesthesia Evaluation  Patient identified by MRN, date of birth, ID band Patient awake    Reviewed: Allergy & Precautions, NPO status , Patient's Chart, lab work & pertinent test results  History of Anesthesia Complications Negative for: history of anesthetic complications  Airway Mallampati: II  TM Distance: >3 FB Neck ROM: Full    Dental  (+) Teeth Intact   Pulmonary shortness of breath, sleep apnea and Continuous Positive Airway Pressure Ventilation , neg COPD, neg recent URI, neg PE   breath sounds clear to auscultation       Cardiovascular hypertension, Pt. on medications (-) angina(-) Past MI and (-) CHF (-) dysrhythmias  Rhythm:Regular     Neuro/Psych  Headaches,  Neuromuscular disease    GI/Hepatic Neg liver ROS, GERD  Medicated and Controlled,  Endo/Other  diabetes, Type 2, Oral Hypoglycemic AgentsMorbid obesity  Renal/GU negative Renal ROS     Musculoskeletal   Abdominal   Peds  Hematology negative hematology ROS (+)   Anesthesia Other Findings   Reproductive/Obstetrics                             Anesthesia Physical Anesthesia Plan  ASA: III  Anesthesia Plan: General   Post-op Pain Management:  Regional for Post-op pain   Induction: Intravenous  PONV Risk Score and Plan: 3 and Dexamethasone and Midazolam  Airway Management Planned: Oral ETT  Additional Equipment: None  Intra-op Plan:   Post-operative Plan: Extubation in OR  Informed Consent: I have reviewed the patients History and Physical, chart, labs and discussed the procedure including the risks, benefits and alternatives for the proposed anesthesia with the patient or authorized representative who has indicated his/her understanding and acceptance.   Dental advisory given  Plan Discussed with: CRNA and Surgeon  Anesthesia Plan Comments:         Anesthesia Quick Evaluation

## 2017-01-02 NOTE — Anesthesia Procedure Notes (Signed)
Anesthesia Regional Block: Interscalene brachial plexus block   Pre-Anesthetic Checklist: ,, timeout performed, Correct Patient, Correct Site, Correct Laterality, Correct Procedure, Correct Position, site marked, Risks and benefits discussed,  Surgical consent,  Pre-op evaluation,  At surgeon's request and post-op pain management  Laterality: Upper and Right  Prep: chloraprep       Needles:  Injection technique: Single-shot  Needle Type: Echogenic Stimulator Needle          Additional Needles:   Procedures: ultrasound guided, nerve stimulator,,,,,,   Nerve Stimulator or Paresthesia:  Response: deltoid, 0.2 mA,   Additional Responses:   Narrative:  Start time: 01/02/2017 9:49 AM End time: 01/02/2017 9:58 AM Injection made incrementally with aspirations every 5 mL.  Performed by: Personally  Anesthesiologist: Caliann Leckrone  Additional Notes: H+P and labs reviewed, risks and benefits discussed with patient, procedure tolerated well without complications

## 2017-01-02 NOTE — Brief Op Note (Signed)
01/02/2017  12:46 PM  PATIENT:  Mone Burkitt  46 y.o. female  PRE-OPERATIVE DIAGNOSIS:  Right shoulder Rotator cuff tear and SLAP tear and AC joint arthritis  POST-OPERATIVE DIAGNOSIS:  Right shoulder Rotator cuff tear and SLAP tear and AC joint arthitis  PROCEDURE:  Procedure(s): Right shoulder arthroscopy, A-subcromial decompression, mini open rotator cuff repair, open distal clavicle resection, biceps tenodesis (Right)  SURGEON:  Surgeon(s) and Role:    Beverely Low* Yittel Emrich, MD - Primary  PHYSICIAN ASSISTANT:   ASSISTANTS: Thea Gisthomas B Dixon,. PA-C   ANESTHESIA:   regional and general  EBL:  Total I/O In: 1400 [I.V.:1400] Out: 50 [Blood:50]  BLOOD ADMINISTERED:none  DRAINS: none   LOCAL MEDICATIONS USED:  MARCAINE     SPECIMEN:  No Specimen  DISPOSITION OF SPECIMEN:  N/A  COUNTS:  YES  TOURNIQUET:  * No tourniquets in log *  DICTATION: .Other Dictation: Dictation Number [id number]  PLAN OF CARE: Discharge to home after PACU  PATIENT DISPOSITION:  PACU - hemodynamically stable.   Delay start of Pharmacological VTE agent (>24hrs) due to surgical blood loss or risk of bleeding: not applicable

## 2017-01-05 ENCOUNTER — Encounter: Payer: Self-pay | Admitting: Physical Therapy

## 2017-01-05 ENCOUNTER — Ambulatory Visit: Payer: 59 | Attending: Orthopedic Surgery | Admitting: Physical Therapy

## 2017-01-05 DIAGNOSIS — M25611 Stiffness of right shoulder, not elsewhere classified: Secondary | ICD-10-CM | POA: Insufficient documentation

## 2017-01-05 DIAGNOSIS — M6281 Muscle weakness (generalized): Secondary | ICD-10-CM | POA: Diagnosis not present

## 2017-01-05 DIAGNOSIS — M25511 Pain in right shoulder: Secondary | ICD-10-CM | POA: Diagnosis not present

## 2017-01-05 DIAGNOSIS — R600 Localized edema: Secondary | ICD-10-CM | POA: Diagnosis not present

## 2017-01-05 NOTE — Op Note (Signed)
[name]:  [name], [name]                 ACCOUNT NO.:  [id number]  MEDICAL RECORD NO.:  [id number]  LOCATION:                                 FACILITY:  PHYSICIAN:  Almedia BallsSteven R. Ranell PatrickNorris, M.D.      DATE OF BIRTH:  DATE OF PROCEDURE:  01/02/2017 DATE OF DISCHARGE:                              OPERATIVE REPORT   PREOPERATIVE DIAGNOSIS:  Right shoulder rotator cuff tear, superior labrum anterior and posterior tear, acromioclavicular joint arthritis.  POSTOPERATIVE DIAGNOSIS:  Right shoulder rotator cuff tear, superior labrum anterior and posterior tear, acromioclavicular joint arthritis.  PROCEDURE PERFORMED:  Right shoulder arthroscopy with extensive intra- articular debridement of torn superior labrum anterior-posterior, arthroscopic biceps tenotomy, arthroscopic subacromial decompression with CA ligament release followed by mini open rotator cuff repair, and biceps tenodesis in the groove and open distal clavicle resection.  ATTENDING SURGEON:  Almedia BallsSteven R. Ranell PatrickNorris, MD.  ASSISTANT:  Thea Gisthomas B Dixon, PA-C, who was scrubbed the entire procedure and necessary for satisfactory completion of surgery.  ANESTHESIA:  General anesthesia was used plus interscalene block.  ESTIMATED BLOOD LOSS:  Minimal.  FLUID REPLACEMENT:  1200 mL crystalloid.  INSTRUMENT COUNTS:  Correct.  COMPLICATIONS:  None.  PROPHYLAXIS:  Perioperative antibiotics were given.  INDICATIONS:  The patient is a 46 year old female with worsening right shoulder pain secondary to rotator cuff tear, significant SLAP tear, and AC joint arthritis creating out with impingement.  The patient has failed an extensive period of conservative management, desires operative treatment to restore function and eliminate pain.  Informed consent obtained.  DESCRIPTION OF PROCEDURE:  After an adequate level of anesthesia was achieved, the patient was positioned in the modified beach-chair position.  Right shoulder was correctly identified,  sterilely prepped and draped in usual manner.  Time-out called.  We entered the shoulder using standard arthroscopic portals including anterior, posterior, and lateral portals.  I identified significant tearing of the superior labrum biceps anchor.  We performed a biceps tenotomy and labral debridement back to stable labral rim.  Extensive tearing was noted all the way back to the 10 o'clock position.  Subscapularis had an upper surface tear representing less than 25% thickness of the tendon.  Simple debridement performed.  There was a full-thickness supra and infraspinatus tear.  It appeared to be involving the anterior aspect of the infraspinatus.  Teres minor was intact.  The posterior inferior labrum was intact as well.  Articular cartilage with grade 2 and grade 3 chondromalacia, but overall looking pretty good.  We placed a separate scope in subacromial space thorough bursectomy and acromioplasty was performed creating a type 1 acromial shape with a butcher block technique utilizing a high-speed bur.  We had a nice decompression of the rotator cuff outlet including CA ligament release.  Rotator cuff was clearly torn.  This was a U-shaped tear.  We went ahead and concluded the arthroscopic portion of procedure, made a small saber incision overlying the AC joint.  Dissection was down through subcu tissues using needle-tip Bovie.  Identified deltotrapezial fascia incised in line with distal clavicle.  Subperiosteal dissection of distal clavicle performed, followed by excision of distal 2-3 mm of bone using oscillating  saw.  We thoroughly irrigated the AC interval, applied bone wax to the cut in the clavicle, removed the dorsal osteophyte off the acromion at the [facility] joint margin, irrigated thoroughly, checked to make sure anterior and posterior AC ligaments were intact, which they were.  We then repaired the deltotrapezial fascia anatomically with 0 Vicryl suture, followed by 2-0 Vicryl  for subcutaneous closure, and 4-0 Monocryl for skin.  We dressed the rotator cuff and the biceps through a single mini open incision, started at the anterior lateral border of the acromion, extending distally about 4 cm.  We dissected down through subcutaneous tissues with a needle-tip Bovie, identified the deltoid raphe between the anterior and lateral heads of the deltoid, divided that using needle- tip Bovie, placed Arthrex retractor.  We were able to identify the bicipital groove and incised the soft tissue over the top of the biceps sheath, delivered the tendon out of the wound, and then repaired the floor of the biceps groove with a curette.  We placed a #2 Hi-Fi suture in a whipstitch fashion into the portion of the tendon.  It would be in the tenodesis area.  This whipstitch suture was then brought up through soft tissue at the proximal extent of the bicipital groove in the rotator interval area over a mattress type bridge.  We then used a Y- Knot Flex anchor through the floor of the biceps groove and brought that suture up through the reinforced portion of the tendon in a mattress fashion as we had good compression with that suture in the longitudinal pull with the whipstitch baseball stitch grab of the biceps tendon, so we had a very strong tenodesis.  We over sewed with 0 Vicryl suture figure-of-eight x2 to further reinforce the repair more distal to the tenodesis site and then incorporated pec tendon.  At this point, we addressed the rotator cuff tear.  This was a supraspinatus primarily with a little bit of infraspinatus tear, mobilized it with #2 Hi-Fi suture in the free edge of the tendon and using a Cobb elevator on both sides of the joint.  We were able to get that anatomically back into place.  We used a total of 4 free edge sutures.  We prepared the greater tuberosity with rongeur, debulk that, and got down to good bleeding bone.  We placed a Y-Knot RC anchor at the  articular margin, brought that double loaded #2 Hi-Fi up in a double mattress fashion through the medial portion of footprint and brought the lateral sutures down through drill holes in the bone and tied over lateral bone bridge.  We had an anatomic watertight double row repair very strong and no dog ears.  We were able to range her shoulder.  No impingement noted.  We thoroughly irrigated the wound and then closed the deltoid anatomically with 0 Vicryl suture, followed by 2-0 Vicryl for subcutaneous closure, and 4-0 Monocryl for skin and portals.  Steri-Strips applied, followed by sterile dressing.  The patient tolerated the surgery well.     Almedia Balls. Ranell Patrick, M.D.   ______________________________ Almedia Balls. Ranell Patrick, M.D.    SRN/MEDQ  D:  01/02/2017  T:  01/02/2017  Job:  [id number]

## 2017-01-05 NOTE — Patient Instructions (Addendum)
ROM: Pendulum (Flexion / Extension)    Let right arm hang and use momentum from body to swing arm forward and back. Progress from small to larger swings. Repeat _10___ times per set. Do __1__ sets per session. Do __3__ sessions per day.  http://orth.exer.us/936   Copyright  VHI. All rights reserved.  ROM: Pendulum (Circular)    Let right arm move in circle clockwise, then counterclockwise, by rocking body weight in circular pattern. Circle _10___ times each direction per set. Do __1__ sets per session. Do __3__ sessions per day.  http://orth.exer.us/794   Copyright  VHI. All rights reserved.  The patient is advised to apply ice or cold packs intermittently as needed to relieve pain. For 15 minutes with right shoulder propped up.   PROM: Wrist Flexion / Extension    Grasp right hand and slowly bend wrist until stretch is felt. Relax. Then stretch as far as possible in opposite direction. Be sure to keep elbow bent. Repeat _10___ times per set. Do ___1_ sets per session. Do _3___ sessions per day.  Copyright  VHI. All rights reserved.  [facility] [address], [address] [place], [place] [address] Phone # [phone] Fax [phone]

## 2017-01-05 NOTE — Therapy (Signed)
[facility] [address] [place], [place], [address] Phone: [phone]   Fax:  [phone]  Physical Therapy Evaluation  Patient Details  Name: [name] MRN: [id number] Date of Birth: January 02, 1971 Referring Provider: Dr. Malon KindleSteven Norris  Encounter Date: 01/05/2017      PT End of Session - 01/05/17 1612    Visit Number 1   Date for PT Re-Evaluation 02/02/17   Authorization Type UMR   PT Start Time 1530   PT Stop Time 1615   PT Time Calculation (min) 45 min   Activity Tolerance Patient tolerated treatment well   Behavior During Therapy [facility] for tasks assessed/performed      Past Medical History:  Diagnosis Date  . Allergy   . Asthma    pt stated treated as ashtma but not really asthma  . Bipolar disorder (HCC)   . Chicken pox   . Complication of anesthesia    [facility] saturation dropped [facility]  . Depression   . Diabetes mellitus   . Dyspnea   . GERD (gastroesophageal reflux disease)   . Hyperlipidemia   . Hypertension    readings  . Hypothyroidism   . Irregular heartbeat    saw dr Myrtis SerKatz sees cardiology as needed  . Migraine   . Sleep apnea     Past Surgical History:  Procedure Laterality Date  . ANKLE SURGERY  1989   left  . BACK SURGERY  1999  . CESAREAN SECTION     1995  . LAPAROSCOPIC ASSISTED VAGINAL HYSTERECTOMY  2009     BSO fibroids, DUB, pelvic pain  . 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 SHOULDER ARTHROSCOPY WITH OPEN ROTATOR CUFF REPAIR Right 01/02/2017   Procedure: Right shoulder arthroscopy, A-subcromial decompression, mini open rotator cuff repair, open distal clavicle resection, biceps tenodesis;  Surgeon: Beverely LowNorris, Steve, MD;  [facility];  Service:  Orthopedics;  Laterality: Right;  . THYROIDECTOMY, PARTIAL  2001   removed left  . UMBILICAL HERNIA REPAIR  2003  . WISDOM TOOTH EXTRACTION      There were no vitals filed for this visit.       Subjective Assessment - 01/05/17 1535    Subjective Patient had surgery on 01/02/2017.  Repaired rotator cuff, released bicep tendon,    Patient is accompained by: Family member  husband   Patient Stated Goals reduce pain and improve mobility and strength   Currently in Pain? Yes   Pain Score 10-Worst pain ever  with medicine 7/10   Pain Location Shoulder   Pain Orientation Left   Pain Descriptors / Indicators Constant;Dull   Pain Type Surgical pain   Pain Onset In the past 7 days   Pain Frequency Constant   Aggravating Factors  movement   Pain Relieving Factors medication; keep arm at side   Multiple Pain Sites No            [facility] PT Assessment - 01/05/17 0001      Assessment   Medical Diagnosis s/p rotator cuff surgery   Referring Provider Dr. Malon KindleSteven Norris   Hand Dominance Right   Prior Therapy none     Precautions   Precautions Shoulder   Type of Shoulder Precautions No shoulder abduction     Restrictions   Weight  Bearing Restrictions No     Balance Screen   Has the patient fallen in the past 6 months No   Has the patient had a decrease in activity level because of a fear of falling?  No   Is the patient reluctant to leave their home because of a fear of falling?  No     Home [employer]ist information centre manager residence     Prior Function   Level of Independence Needs assistance with ADLs   Vocation Full time employment  not at work right now     Cognition   Overall Cognitive Status Within Functional Limits for tasks assessed     Observation/Other Assessments   Skin Integrity Patient is wearing a bandage on her right shoulder    Focus on Therapeutic Outcomes (FOTO)  96% limitation  54% limitation     Observation/Other Assessments-Edema    Edema  --  has edema but unable to measure due to right shoulder     Posture/Postural Control   Posture Comments keeps her right arm in her sling 24/7     ROM / Strength   AROM / PROM / Strength AROM;PROM;Strength     AROM   Overall AROM Comments not able to assess due to recent surgery     PROM   Overall PROM Comments Patient has full right wrist PROM   Right Shoulder Flexion 10 Degrees   Right Shoulder External Rotation -20 Degrees  o degrees abduction at patient side   Right Elbow Extension -10     Strength   Overall Strength Comments not assess right shoulder due to recent surgery     Palpation   Palpation comment tenderness in right shoulder and right interscapular area     Transfers   Comments need assistance due to not able to use her right arm     Ambulation/Gait   Ambulation/Gait No            Objective measurements completed on examination: See above findings.          [facility] Adult PT Treatment/Exercise - 01/05/17 0001      Modalities   Modalities Electrical Stimulation;Cryotherapy     Cryotherapy   Number Minutes Cryotherapy 15 Minutes   Cryotherapy Location Shoulder  right   Type of Cryotherapy Ice pack     Electrical Stimulation   Electrical Stimulation Location right shoulder   Electrical Stimulation Action IFC   Electrical Stimulation Parameters 15 min, to patient tolerance   Electrical Stimulation Goals Pain                PT Education - 01/05/17 1610    Education provided Yes   Education Details pendulums; wrist PROM, ice   Person(s) Educated Patient;Spouse   Methods Explanation;Demonstration;Verbal cues;Handout   Comprehension Verbalized understanding;Returned demonstration          PT Short Term Goals - 01/05/17 1636      PT SHORT TERM GOAL #1   Title independent with initial HEP with her husband helping   Time 2   Period Weeks   Status New   Target Date 01/19/17     PT SHORT TERM GOAL #2   Title understand how to  ice the shoulder   Time 4   Period Weeks   Status New   Target Date 01/19/17     PT SHORT TERM GOAL #3   Title pain in right shoulder decreased >/= 25%   Time 2   Period  Weeks   Status New   Target Date 01/19/17           PT Long Term Goals - 01/05/17 1638      PT LONG TERM GOAL #1   Title Independent with HEP and how to progress herself   Time 4   Period Weeks   Status New   Target Date 02/02/17     PT LONG TERM GOAL #2   Title right shoulder AROM is WFL so she is able to dress herself without difficulty   Time 4   Period Weeks   Status New   Target Date 02/02/17     PT LONG TERM GOAL #3   Title right shoulder strength is >/= 4/5 so she is able to perform her work tasks without increase in pain   Time 4   Period Weeks   Status New   Target Date 02/02/17     PT LONG TERM GOAL #4   Title right shoulder pain decreased >/= 75% due to improved mobility and reduction in edema   Time 4   Period Weeks   Status New   Target Date 02/02/17     PT LONG TERM GOAL #5   Title able to lift light object like her purse due to right shoulder strength >/= 4/5 and no restrictions from the MD   Time 4   Period Weeks   Status New   Target Date 02/02/17     Additional Long Term Goals   Additional Long Term Goals Yes     PT LONG TERM GOAL #6   Title FOTO score </= 54% limitation   Time 4   Period Weeks   Status New   Target Date 02/02/17                Plan - 01/05/17 1612    Clinical Impression Statement Patient is a 46 year old female with s/p right rotator cuff surgery on 01/02/2017.  Patient is in a sling 24 hours a day.  Patient is not allowed to do right shoulder abduction, AAROM , increase ROM gently and isometric exercises.  Patient reports right shoulder pain without medicaiton is 10/10 and with medication is 7/10.  Patient has full right wrist ROM.  Right elbow extension PROM is -10.  Right shoulder ER PROM is -20 with 0 degrees shoulder abduction.  Right  shoulder flexion is 10 degrees PROM. Patient has swelling in right shoulder.  She has a big bandage on her right shoulder to cover the surgical area.  Patient has tenderness located in right shoulder and interscapular area. Patient will benefit from skilled therapy to reduce pain, improve mobility in right shoulder as MD allows and progress strength as MD allows.    History and Personal Factors relevant to plan of care: s/p right rotator cuff surgery 01/02/2017; Diabetes   Clinical Presentation Evolving   Clinical Presentation due to: pain is not undercontrol at this time and umable to use right arm for ADL's or work   Clinical Decision Making Moderate   Rehab Potential Excellent   Clinical Impairments Affecting Rehab Potential No shoulder abduction; AAROM to right shoulder gently, isometrics   PT Frequency 3x / week   PT Duration 4 weeks   PT Treatment/Interventions Cryotherapy;Electrical Stimulation;Moist Heat;Ultrasound;Patient/family education;Neuromuscular re-education;Therapeutic exercise;Therapeutic activities;Taping;Vasopneumatic Device;Passive range of motion;Scar mobilization;Manual techniques   PT Next Visit Plan PROM to right shoulder for flexion, IR and ER with arm at side; PROM to right shoulder and wrist;  soft tissue work; vasopnuematic with electrical stim to right shoulder   PT Home Exercise Plan progress as needed   Consulted and Agree with Plan of Care Patient;Family member/caregiver   Family Member Consulted husband      Patient will benefit from skilled therapeutic intervention in order to improve the following deficits and impairments:  Decreased activity tolerance, Decreased mobility, Decreased strength, Increased edema, Decreased scar mobility, Impaired flexibility, Decreased endurance, Decreased coordination, Decreased range of motion, Increased fascial restricitons, Pain  Visit Diagnosis: Acute pain of right shoulder  Muscle weakness (generalized)  Stiffness of right  shoulder, not elsewhere classified  Localized edema     Problem List Patient Active Problem List   Diagnosis Date Noted  . Morbid obesity with body mass index (BMI) of 50.0 to 59.9 in adult [facility]) 04/14/2016  . Type 2 diabetes mellitus without complications (HCC) 10/05/2015  . Hypersomnia with sleep apnea 06/20/2015  . Right lumbar radiculopathy 03/13/2014  . Occipital headache 01/03/2014  . 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 Benign paroxysmal positional vertigo 02/03/2013  . Ear pain 03/12/2012  . General medical examination 08/06/2011  . SINUS TACHYCARDIA 08/22/2008  . GERD 08/22/2008  . HYPERLIPIDEMIA, MIXED 05/25/2007  . OBESITY, MORBID 05/25/2007  . Bipolar disorder (HCC) 05/25/2007  . MIGRAINE, CHRONIC 05/25/2007  . Essential hypertension 05/25/2007  . Allergic rhinitis 05/25/2007  . Obstructive sleep apnea 05/17/2007    Eulis Foster, PT 01/05/17 5:05 PM   [facility] [address] [place], [place], [address] Phone: [phone]   Fax:  [phone]  Name: [name] MRN: [id number] Date of Birth: February 24, 1971

## 2017-01-07 ENCOUNTER — Ambulatory Visit: Payer: 59

## 2017-01-07 DIAGNOSIS — M25611 Stiffness of right shoulder, not elsewhere classified: Secondary | ICD-10-CM

## 2017-01-07 DIAGNOSIS — M6281 Muscle weakness (generalized): Secondary | ICD-10-CM

## 2017-01-07 DIAGNOSIS — R600 Localized edema: Secondary | ICD-10-CM

## 2017-01-07 DIAGNOSIS — M25511 Pain in right shoulder: Secondary | ICD-10-CM | POA: Diagnosis not present

## 2017-01-07 NOTE — Therapy (Signed)
[facility] [address] [place], [place], [address] Phone: [phone]   Fax:  [phone]  Physical Therapy Treatment  Patient Details  Name: [name] MRN: [id number] Date of Birth: 03-05-1971 Referring Provider: Dr. Malon Kindle  Encounter Date: 01/07/2017      PT End of Session - 01/07/17 1504    Visit Number 2   Date for PT Re-Evaluation 02/02/17   Authorization Type UMR   PT Start Time 1449   PT Stop Time 1545   PT Time Calculation (min) 56 min   Activity Tolerance Patient tolerated treatment well   Behavior During Therapy [facility] for tasks assessed/performed      Past Medical History:  Diagnosis Date  . Allergy   . Asthma    pt stated treated as ashtma but not really asthma  . Bipolar disorder (HCC)   . Chicken pox   . Complication of anesthesia    oxygen saturation dropped after hysterectomy 2009 at [facility]  . Depression   . Diabetes mellitus   . Dyspnea   . GERD (gastroesophageal reflux disease)   . Hyperlipidemia   . Hypertension    readings  . Hypothyroidism   . Irregular heartbeat    saw dr Myrtis Ser sees cardiology as needed  . Migraine   . Sleep apnea     Past Surgical History:  Procedure Laterality Date  . ANKLE SURGERY  1989   left  . BACK SURGERY  1999  . CESAREAN SECTION     1995  . LAPAROSCOPIC ASSISTED VAGINAL HYSTERECTOMY  2009     BSO fibroids, DUB, pelvic pain  . 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 SHOULDER ARTHROSCOPY WITH OPEN ROTATOR CUFF REPAIR Right 01/02/2017   Procedure: Right shoulder arthroscopy, A-subcromial decompression, mini open rotator cuff repair, open distal clavicle resection, biceps tenodesis;  Surgeon: Beverely Low, MD;  Location: [facility] OR;  Service:  Orthopedics;  Laterality: Right;  . THYROIDECTOMY, PARTIAL  2001   removed left  . UMBILICAL HERNIA REPAIR  2003  . WISDOM TOOTH EXTRACTION      There were no vitals filed for this visit.      Subjective Assessment - 01/07/17 1451    Subjective Pt. doing well however noting high pain levels.  Sleeping in recliner currently.  Wearing sling 24hr/day with exception of performing pendulum HEP.     Patient Stated Goals reduce pain and improve mobility and strength   Currently in Pain? Yes   Pain Score 4    Pain Location Shoulder   Pain Orientation Left   Pain Descriptors / Indicators Constant;Dull  sharp with movement   Pain Type Surgical pain   Pain Frequency Constant   Aggravating Factors  movement   Pain Relieving Factors no movement, sitting in reclincer   Multiple Pain Sites No                         [facility] Adult PT Treatment/Exercise - 01/07/17 1734      Self-Care   Self-Care Other Self-Care Comments   Other Self-Care Comments  Discussion with pt. regarding need for early motion with pendulums, PROM with therapist in treatment; pt. verbalized understanding     Exercises   Exercises Shoulder;Wrist     Shoulder Exercises: Standing  Other Standing Exercises R shoulder pendulums circles, side<>side, forward/back x 15 reps each  cueing required to produce motion from hips     Wrist Exercises   Wrist Flexion Right;20 reps   Wrist Extension Right;20 reps   Other wrist exercises small ball squeeze 5" x 15 reps      Modalities   Modalities Vasopneumatic     Electrical Stimulation   Electrical Stimulation Location right shoulder   Electrical Stimulation Action IFC   Electrical Stimulation Parameters 80-150Hz , intensity to pt. tolerance, 10'    Electrical Stimulation Goals Pain     Vasopneumatic   Number Minutes Vasopneumatic  10 minutes   Vasopnuematic Location  Shoulder  R   Vasopneumatic Pressure Low   Vasopneumatic Temperature  coldest temp.       Manual Therapy   Manual Therapy Soft tissue mobilization;Passive ROM   Soft tissue mobilization STM to R UT, medial scap. border, posterior shoulder to reduce tone and guarding   Passive ROM R shoulder PROM attempted however pt. unable to tolerate supine or sidelying positioning thus not performed; pt. guarding and unable to relax R shoulder musculature                  PT Short Term Goals - 01/07/17 1740      PT SHORT TERM GOAL #1   Title independent with initial HEP with her husband helping   Time 2   Period Weeks   Status On-going     PT SHORT TERM GOAL #2   Title understand how to ice the shoulder   Time 4   Period Weeks   Status On-going     PT SHORT TERM GOAL #3   Title pain in right shoulder decreased >/= 25%   Time 2   Period Weeks   Status On-going           PT Long Term Goals - 01/07/17 1740      PT LONG TERM GOAL #1   Title Independent with HEP and how to progress herself   Time 4   Period Weeks   Status On-going     PT LONG TERM GOAL #2   Title right shoulder AROM is WFL so she is able to dress herself without difficulty   Time 4   Period Weeks   Status On-going     PT LONG TERM GOAL #3   Title right shoulder strength is >/= 4/5 so she is able to perform her work tasks without increase in pain   Time 4   Period Weeks   Status On-going     PT LONG TERM GOAL #4   Title right shoulder pain decreased >/= 75% due to improved mobility and reduction in edema   Time 4   Period Weeks   Status On-going     PT LONG TERM GOAL #5   Title able to lift light object like her purse due to right shoulder strength >/= 4/5 and no restrictions from the MD   Time 4   Period Weeks   Status On-going     PT LONG TERM GOAL #6   Title FOTO score </= 54% limitation   Time 4   Period Weeks   Status New               Plan - 01/07/17 1509    Clinical Impression Statement Pt. seen to start treatment in R shoulder sling with abduction pillow.   Notes she is still sleeping in  recliner and has consistently high pain levels.  Review of pendulums today with some cueing required to get pt. to produce UE motion from hips.  Pt. with limited tolerance for PROM today due to high pain levels and difficulty positioning.  Pt. very guarded thus STM to upper back and shoulder musculature to promote reduction in tone and pain.  Treatment ending with E-stim/vaso combo to decrease tone and pain.  Will plan to begin PROM in upcoming treatment.   PT Treatment/Interventions Cryotherapy;Electrical Stimulation;Moist Heat;Ultrasound;Patient/family education;Neuromuscular re-education;Therapeutic exercise;Therapeutic activities;Taping;Vasopneumatic Device;Passive range of motion;Scar mobilization;Manual techniques   PT Next Visit Plan PROM to right shoulder for flexion, IR and ER with arm at side; PROM to right shoulder and wrist; soft tissue work; vasopnuematic with electrical stim to right shoulder      Patient will benefit from skilled therapeutic intervention in order to improve the following deficits and impairments:  Decreased activity tolerance, Decreased mobility, Decreased strength, Increased edema, Decreased scar mobility, Impaired flexibility, Decreased endurance, Decreased coordination, Decreased range of motion, Increased fascial restricitons, Pain  Visit Diagnosis: Acute pain of right shoulder  Muscle weakness (generalized)  Stiffness of right shoulder, not elsewhere classified  Localized edema     Problem List Patient Active Problem List   Diagnosis Date Noted  . Morbid obesity with body mass index (BMI) of 50.0 to 59.9 in adult [facility]) 04/14/2016  . Type 2 diabetes mellitus without complications (HCC) 10/05/2015  . Hypersomnia with sleep apnea 06/20/2015  . Right lumbar radiculopathy 03/13/2014  . Occipital headache 01/03/2014  . 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 Benign paroxysmal positional vertigo 02/03/2013  . Ear pain  03/12/2012  . General medical examination 08/06/2011  . SINUS TACHYCARDIA 08/22/2008  . GERD 08/22/2008  . HYPERLIPIDEMIA, MIXED 05/25/2007  . OBESITY, MORBID 05/25/2007  . Bipolar disorder (HCC) 05/25/2007  . MIGRAINE, CHRONIC 05/25/2007  . Essential hypertension 05/25/2007  . Allergic rhinitis 05/25/2007  . Obstructive sleep apnea 05/17/2007    Kermit Balo, PTA 01/07/17 5:46 PM   [facility] [address], [address] [place], [place], [address] Phone: [phone]   Fax:  [phone]  Name: [name] MRN: [id number] Date of Birth: 12/22/1970

## 2017-01-08 MED FILL — OXYCODONE-ACETAMINOPHEN 5-3: 5-325 | 5 days supply | Qty: 60 | Fill #0

## 2017-01-09 ENCOUNTER — Ambulatory Visit: Payer: 59 | Admitting: Physical Therapy

## 2017-01-09 DIAGNOSIS — M25511 Pain in right shoulder: Secondary | ICD-10-CM | POA: Diagnosis not present

## 2017-01-09 DIAGNOSIS — M25611 Stiffness of right shoulder, not elsewhere classified: Secondary | ICD-10-CM

## 2017-01-09 DIAGNOSIS — M6281 Muscle weakness (generalized): Secondary | ICD-10-CM

## 2017-01-09 DIAGNOSIS — R600 Localized edema: Secondary | ICD-10-CM | POA: Diagnosis not present

## 2017-01-09 NOTE — Therapy (Signed)
[facility] [address], [address] [place], [place], [address] Phone: [phone]   Fax:  [phone]  Physical Therapy Treatment  Patient Details  Name: [name] MRN: [id number] Date of Birth: 1971/01/09 Referring Provider: Dr. Malon Kindle  Encounter Date: 01/09/2017      PT End of Session - 01/09/17 1048    Visit Number 3   Date for PT Re-Evaluation 02/02/17   Authorization Type UMR   PT Start Time 1015   PT Stop Time 1105   PT Time Calculation (min) 50 min   Activity Tolerance Patient tolerated treatment well   Behavior During Therapy [facility] for tasks assessed/performed      Past Medical History:  Diagnosis Date  . Allergy   . Asthma    pt stated treated as ashtma but not really asthma  . Bipolar disorder (HCC)   . Chicken pox   . Complication of anesthesia    oxygen saturation dropped after hysterectomy 2009 at [facility]  . Depression   . Diabetes mellitus   . Dyspnea   . GERD (gastroesophageal reflux disease)   . Hyperlipidemia   . Hypertension    readings  . Hypothyroidism   . Irregular heartbeat    saw dr Myrtis Ser sees cardiology as needed  . Migraine   . Sleep apnea     Past Surgical History:  Procedure Laterality Date  . ANKLE SURGERY  1989   left  . BACK SURGERY  1999  . CESAREAN SECTION     1995  . LAPAROSCOPIC ASSISTED VAGINAL HYSTERECTOMY  2009     BSO fibroids, DUB, pelvic pain  . 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 SHOULDER ARTHROSCOPY WITH OPEN ROTATOR CUFF REPAIR Right 01/02/2017   Procedure: Right shoulder arthroscopy, A-subcromial decompression, mini open rotator cuff repair, open distal clavicle resection, biceps tenodesis;  Surgeon: Beverely Low, MD;  Location: [facility] OR;  Service:  Orthopedics;  Laterality: Right;  . THYROIDECTOMY, PARTIAL  2001   removed left  . UMBILICAL HERNIA REPAIR  2003  . WISDOM TOOTH EXTRACTION      There were no vitals filed for this visit.      Subjective Assessment - 01/09/17 1016    Subjective Kind of hurts this AM.    Currently in Pain? Yes   Pain Score 6    Pain Location Shoulder   Pain Orientation Right   Pain Descriptors / Indicators Sore   Multiple Pain Sites No                         [facility] Adult PT Treatment/Exercise - 01/09/17 0001      Shoulder Exercises: Seated   Flexion --  Table slides 2x 20   Other Seated Exercises elbow flexion 2x 10   Other Seated Exercises small shoulder GH circles 10 fwrd, 10 back      Wrist Exercises   Other wrist exercises red digiflex gripping 3x10     Modalities   Modalities Vasopneumatic     Vasopneumatic   Number Minutes Vasopneumatic  15 minutes   Vasopnuematic Location  Shoulder  R   Vasopneumatic Pressure Medium   Vasopneumatic Temperature  3 flakes     Manual Therapy   Passive ROM Pt seated: PTA by Rt side: short  forwar flexion and ER to neutral PROM                  PT Short Term Goals - 01/07/17 1740      PT SHORT TERM GOAL #1   Title independent with initial HEP with her husband helping   Time 2   Period Weeks   Status On-going     PT SHORT TERM GOAL #2   Title understand how to ice the shoulder   Time 4   Period Weeks   Status On-going     PT SHORT TERM GOAL #3   Title pain in right shoulder decreased >/= 25%   Time 2   Period Weeks   Status On-going           PT Long Term Goals - 01/07/17 1740      PT LONG TERM GOAL #1   Title Independent with HEP and how to progress herself   Time 4   Period Weeks   Status On-going     PT LONG TERM GOAL #2   Title right shoulder AROM is WFL so she is able to dress herself without difficulty   Time 4   Period Weeks   Status On-going     PT LONG TERM GOAL #3   Title right  shoulder strength is >/= 4/5 so she is able to perform her work tasks without increase in pain   Time 4   Period Weeks   Status On-going     PT LONG TERM GOAL #4   Title right shoulder pain decreased >/= 75% due to improved mobility and reduction in edema   Time 4   Period Weeks   Status On-going     PT LONG TERM GOAL #5   Title able to lift light object like her purse due to right shoulder strength >/= 4/5 and no restrictions from the MD   Time 4   Period Weeks   Status On-going     PT LONG TERM GOAL #6   Title FOTO score </= 54% limitation   Time 4   Period Weeks   Status New               Plan - 01/09/17 1018    Clinical Impression Statement Pt presents with higher levels of pain today. She was able to tolerate gentle PROM and wrist and elbow exercises. Pt declined Estim today as she felt this did not change her pain, but really likes the Game ready.    Rehab Potential Excellent   Clinical Impairments Affecting Rehab Potential No shoulder abduction; AAROM to right shoulder gently, isometrics   PT Frequency 3x / week   PT Duration 4 weeks   PT Treatment/Interventions Cryotherapy;Electrical Stimulation;Moist Heat;Ultrasound;Patient/family education;Neuromuscular re-education;Therapeutic exercise;Therapeutic activities;Taping;Vasopneumatic Device;Passive range of motion;Scar mobilization;Manual techniques   PT Next Visit Plan PROM to right shoulder for flexion, IR and ER with arm at side; PROM to right shoulder and wrist; soft tissue work; vasopnuematic .   Consulted and Agree with Plan of Care Patient      Patient will benefit from skilled therapeutic intervention in order to improve the following deficits and impairments:  Decreased activity tolerance, Decreased mobility, Decreased strength, Increased edema, Decreased scar mobility, Impaired flexibility, Decreased endurance, Decreased coordination, Decreased range of motion, Increased fascial restricitons, Pain  Visit  Diagnosis: Acute pain of right shoulder  Muscle weakness (generalized)  Stiffness of right shoulder, not elsewhere classified     Problem List Patient  Active Problem List   Diagnosis Date Noted  . Morbid obesity with body mass index (BMI) of 50.0 to 59.9 in adult [facility]) 04/14/2016  . Type 2 diabetes mellitus without complications (HCC) 10/05/2015  . Hypersomnia with sleep apnea 06/20/2015  . Right lumbar radiculopathy 03/13/2014  . Occipital headache 01/03/2014  . 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 Benign paroxysmal positional vertigo 02/03/2013  . Ear pain 03/12/2012  . General medical examination 08/06/2011  . SINUS TACHYCARDIA 08/22/2008  . GERD 08/22/2008  . HYPERLIPIDEMIA, MIXED 05/25/2007  . OBESITY, MORBID 05/25/2007  . Bipolar disorder (HCC) 05/25/2007  . MIGRAINE, CHRONIC 05/25/2007  . Essential hypertension 05/25/2007  . Allergic rhinitis 05/25/2007  . Obstructive sleep apnea 05/17/2007    COCHRAN,JENNIFER, PTA 01/09/2017, 10:53 AM  [facility] [address] [place], [place], [address] Phone: [phone]   Fax:  [phone]  Name: Jaleen Schlader MRN: [id number] Date of Birth: 01/23/1971

## 2017-01-12 ENCOUNTER — Encounter: Payer: Self-pay | Admitting: Physical Therapy

## 2017-01-12 ENCOUNTER — Ambulatory Visit: Payer: 59 | Admitting: Physical Therapy

## 2017-01-12 DIAGNOSIS — M25511 Pain in right shoulder: Secondary | ICD-10-CM

## 2017-01-12 DIAGNOSIS — M6281 Muscle weakness (generalized): Secondary | ICD-10-CM | POA: Diagnosis not present

## 2017-01-12 DIAGNOSIS — R600 Localized edema: Secondary | ICD-10-CM

## 2017-01-12 DIAGNOSIS — M25611 Stiffness of right shoulder, not elsewhere classified: Secondary | ICD-10-CM | POA: Diagnosis not present

## 2017-01-12 NOTE — Therapy (Signed)
[facility] [address] [place], [place], [address] Phone: [phone]   Fax:  [phone]  Physical Therapy Treatment  Patient Details  Name: [name] MRN: [id number] Date of Birth: 02/08/1971 Referring Provider: Dr. Esmond Plants  Encounter Date: 01/12/2017      PT End of Session - 01/12/17 1147    Visit Number 4   Date for PT Re-Evaluation 02/02/17   Authorization Type UMR   PT Start Time 1145   PT Stop Time 1230   PT Time Calculation (min) 45 min   Activity Tolerance Patient tolerated treatment well   Behavior During Therapy [facility] for tasks assessed/performed      Past Medical History:  Diagnosis Date  . Allergy   . Asthma    pt stated treated as ashtma but not really asthma  . Bipolar disorder ([place])   . Chicken pox   . Complication of anesthesia    oxygen saturation dropped after hysterectomy 2009 at [facility]  . Depression   . Diabetes mellitus   . Dyspnea   . GERD (gastroesophageal reflux disease)   . Hyperlipidemia   . Hypertension    readings  . Hypothyroidism   . Irregular heartbeat    saw dr Ron Parker sees cardiology as needed  . Migraine   . Sleep apnea     Past Surgical History:  Procedure Laterality Date  . [place]   left  . BACK SURGERY  1999  . CESAREAN SECTION     1995  . LAPAROSCOPIC ASSISTED VAGINAL HYSTERECTOMY  2009     BSO fibroids, DUB, pelvic pain  . 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 REPAIR N/A 04/14/2016   Procedure: LAPAROSCOPIC ROUX-EN-Y GASTRIC BYPASS  WITH UPPER ENDOSCOPY;  Surgeon: Excell Seltzer, MD;  [facility];  Service: General;  Laterality: N/A;  . SHOULDER ARTHROSCOPY WITH OPEN ROTATOR CUFF REPAIR Right 01/02/2017   Procedure: Right shoulder arthroscopy, A-subcromial decompression, mini open rotator cuff repair, open distal clavicle resection, biceps tenodesis;  Surgeon: Netta Cedars, MD;  Location: [place];  Service:  Orthopedics;  Laterality: Right;  . THYROIDECTOMY, PARTIAL  2001   removed left  . UMBILICAL HERNIA REPAIR  2003  . WISDOM TOOTH EXTRACTION      There were no vitals filed for this visit.      Subjective Assessment - 01/12/17 1148    Subjective Felt good to move it last session, going to MD tomorrow to hopefully get bandages off.    Currently in Pain? Yes  I had pain getting ready this AM but none right now.   Multiple Pain Sites No                         [facility] Adult PT Treatment/Exercise - 01/12/17 0001      Shoulder Exercises: Seated   Flexion --  Table slides 2x 20   ABduction Limitations red digiflex gripping 2 x1 min   Other Seated Exercises elbow flexion 3x 10   Other Seated Exercises small shoulder GH circles 10 fwrd, 10 back      Cryotherapy   Number Minutes Cryotherapy 10 Minutes   Cryotherapy Location Shoulder   Type of Cryotherapy Ice pack     Manual Therapy   Passive ROM Pt seated: PTA by Rt side:  forward flexion and ER  PROM  PT Short Term Goals - 01/12/17 1222      PT SHORT TERM GOAL #1   Title independent with initial HEP with her husband helping   Time 2   Period Weeks   Status Achieved     PT SHORT TERM GOAL #2   Title understand how to ice the shoulder   Time 4   Period Weeks   Status Achieved     PT SHORT TERM GOAL #3   Title pain in right shoulder decreased >/= 25%   Time 2   Period Weeks   Status Achieved           PT Long Term Goals - 01/07/17 1740      PT LONG TERM GOAL #1   Title Independent with HEP and how to progress herself   Time 4   Period Weeks   Status On-going     PT LONG TERM GOAL #2   Title right shoulder AROM is WFL so she is able to dress herself without difficulty   Time 4   Period Weeks   Status On-going     PT LONG TERM GOAL #3   Title right shoulder strength is >/= 4/5 so she is able to perform her work tasks without increase in pain   Time 4   Period  Weeks   Status On-going     PT LONG TERM GOAL #4   Title right shoulder pain decreased >/= 75% due to improved mobility and reduction in edema   Time 4   Period Weeks   Status On-going     PT LONG TERM GOAL #5   Title able to lift light object like her purse due to right shoulder strength >/= 4/5 and no restrictions from the MD   Time 4   Period Weeks   Status On-going     PT LONG TERM GOAL #6   Title FOTO score </= 54% limitation   Time 4   Period Weeks   Status New               Plan - 01/12/17 1147    Clinical Impression Statement Pt felt better moving her fingers and getting the arm moving some after last session. Pt could tolerlate more PROM today, anticipate this will be be even easier once her bandages come off tomorrow. Declined Game Ready as she did not want any pressure on her shoulder.  All short term goals met this week.    Clinical Impairments Affecting Rehab Potential No shoulder abduction; AAROM to right shoulder gently, isometrics   PT Frequency 3x / week   PT Duration 4 weeks   PT Treatment/Interventions Cryotherapy;Electrical Stimulation;Moist Heat;Ultrasound;Patient/family education;Neuromuscular re-education;Therapeutic exercise;Therapeutic activities;Taping;Vasopneumatic Device;Passive range of motion;Scar mobilization;Manual techniques   PT Next Visit Plan See what MD says, progress P/AAROM, add pulleys   Consulted and Agree with Plan of Care --      Patient will benefit from skilled therapeutic intervention in order to improve the following deficits and impairments:  Decreased activity tolerance, Decreased mobility, Decreased strength, Increased edema, Decreased scar mobility, Impaired flexibility, Decreased endurance, Decreased coordination, Decreased range of motion, Increased fascial restricitons, Pain  Visit Diagnosis: Acute pain of right shoulder  Muscle weakness (generalized)  Stiffness of right shoulder, not elsewhere  classified  Localized edema     Problem List Patient Active Problem List   Diagnosis Date Noted  . Morbid obesity with body mass index (BMI) of 50.0 to 59.9 in adult [facility]) 04/14/2016  .  Type 2 diabetes mellitus without complications ([place]) 80/88/1103  . Hypersomnia with sleep apnea 06/20/2015  . Right lumbar radiculopathy 03/13/2014  . Occipital headache 01/03/2014  . 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 Benign paroxysmal positional vertigo 02/03/2013  . Ear pain 03/12/2012  . General medical examination 08/06/2011  . SINUS TACHYCARDIA 08/22/2008  . GERD 08/22/2008  . HYPERLIPIDEMIA, MIXED 05/25/2007  . OBESITY, MORBID 05/25/2007  . Bipolar disorder ([place]) 05/25/2007  . MIGRAINE, CHRONIC 05/25/2007  . Essential hypertension 05/25/2007  . Allergic rhinitis 05/25/2007  . Obstructive sleep apnea 05/17/2007    Vincenzina Jagoda, PTA 01/12/2017, 12:25 PM  [facility] [address], [place], [place], [address] Phone: [phone]   Fax:  [phone]  Name: [name] MRN: [id number] Date of Birth: 1971/02/02

## 2017-01-13 MED FILL — HYDROCODON-APAP 5-325: 5-325 | 5 days supply | Qty: 30 | Fill #0

## 2017-01-14 ENCOUNTER — Ambulatory Visit: Payer: 59

## 2017-01-14 DIAGNOSIS — R600 Localized edema: Secondary | ICD-10-CM

## 2017-01-14 DIAGNOSIS — M6281 Muscle weakness (generalized): Secondary | ICD-10-CM

## 2017-01-14 DIAGNOSIS — M25611 Stiffness of right shoulder, not elsewhere classified: Secondary | ICD-10-CM

## 2017-01-14 DIAGNOSIS — M25511 Pain in right shoulder: Secondary | ICD-10-CM | POA: Diagnosis not present

## 2017-01-14 NOTE — Therapy (Signed)
[facility] [address] [place], [place], [address] Phone: [phone]   Fax:  [phone]  Physical Therapy Treatment  Patient Details  Name: [name] MRN: [id number] Date of Birth: 1970/09/21 Referring Provider: Dr. Malon Kindle  Encounter Date: 01/14/2017      PT End of Session - 01/14/17 1522    Visit Number 5   Date for PT Re-Evaluation 02/02/17   Authorization Type UMR   PT Start Time 1451  late, pain   PT Stop Time 1527   PT Time Calculation (min) 36 min   Activity Tolerance Patient limited by pain  6/10 Rt shoulder pain with movement   Behavior During Therapy [facility] for tasks assessed/performed      Past Medical History:  Diagnosis Date  . Allergy   . Asthma    pt stated treated as ashtma but not really asthma  . Bipolar disorder (HCC)   . Chicken pox   . Complication of anesthesia    oxygen saturation dropped after hysterectomy 2009 at [facility]  . Depression   . Diabetes mellitus   . Dyspnea   . GERD (gastroesophageal reflux disease)   . Hyperlipidemia   . Hypertension    readings  . Hypothyroidism   . Irregular heartbeat    saw dr Myrtis Ser sees cardiology as needed  . Migraine   . Sleep apnea     Past Surgical History:  Procedure Laterality Date  . ANKLE SURGERY  1989   left  . BACK SURGERY  1999  . CESAREAN SECTION     1995  . LAPAROSCOPIC ASSISTED VAGINAL HYSTERECTOMY  2009     BSO fibroids, DUB, pelvic pain  . 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 SHOULDER ARTHROSCOPY WITH OPEN ROTATOR CUFF REPAIR Right 01/02/2017   Procedure: Right shoulder arthroscopy, A-subcromial decompression, mini open rotator cuff repair, open distal clavicle resection, biceps tenodesis;  Surgeon: Beverely Low, MD;  Location: [facility] OR;  Service: Orthopedics;  Laterality: Right;  . THYROIDECTOMY, PARTIAL  2001   removed left  . UMBILICAL HERNIA REPAIR  2003  . WISDOM TOOTH EXTRACTION      There were no vitals filed for this visit.      Subjective Assessment - 01/14/17 1455    Subjective I had my stitches removed today.     Currently in Pain? No/denies                         [facility] Adult PT Treatment/Exercise - 01/14/17 0001      Shoulder Exercises: Seated   Flexion --  using UE Ranger on the floor 2x10   Abduction AAROM;Right   Other Seated Exercises elbow flexion 3x 10   Other Seated Exercises small shoulder GH circles 10 fwrd, 10 back      Shoulder Exercises: Isometric Strengthening   Extension Supine;3X5"  elbow bent     Cryotherapy   Number Minutes Cryotherapy 10 Minutes   Cryotherapy Location Shoulder   Type of Cryotherapy Ice pack     Manual Therapy   Manual Therapy Soft tissue mobilization;Passive ROM   Passive ROM pt reclined: gentle P/ROM into flexion                  PT Short Term Goals -  01/12/17 1222      PT SHORT TERM GOAL #1   Title independent with initial HEP with her husband helping   Time 2   Period Weeks   Status Achieved     PT SHORT TERM GOAL #2   Title understand how to ice the shoulder   Time 4   Period Weeks   Status Achieved     PT SHORT TERM GOAL #3   Title pain in right shoulder decreased >/= 25%   Time 2   Period Weeks   Status Achieved           PT Long Term Goals - 01/07/17 1740      PT LONG TERM GOAL #1   Title Independent with HEP and how to progress herself   Time 4   Period Weeks   Status On-going     PT LONG TERM GOAL #2   Title right shoulder AROM is WFL so she is able to dress herself without difficulty   Time 4   Period Weeks   Status On-going     PT LONG TERM GOAL #3   Title right shoulder strength is >/= 4/5 so she is able to perform her work tasks without increase in pain    Time 4   Period Weeks   Status On-going     PT LONG TERM GOAL #4   Title right shoulder pain decreased >/= 75% due to improved mobility and reduction in edema   Time 4   Period Weeks   Status On-going     PT LONG TERM GOAL #5   Title able to lift light object like her purse due to right shoulder strength >/= 4/5 and no restrictions from the MD   Time 4   Period Weeks   Status On-going     PT LONG TERM GOAL #6   Title FOTO score </= 54% limitation   Time 4   Period Weeks   Status New               Plan - 01/14/17 1504    Clinical Impression Statement Pt able to tolerate A/AROM of Rt shoulder using UE ranger on the floor today.  Pt with reduced Rt shoulder upper trap activity with shoulder shrugs vs the Rt.  Pt tolerated P/ROM of the Rt shoulder and elbow today.  Pt will continue to benefit from skilled PT for gentle A/ROM and P/ROM progression of Rt shoulder.  Gentle isometrics as tolerated.     Rehab Potential Excellent   Clinical Impairments Affecting Rehab Potential No shoulder abduction; AAROM to right shoulder gently, isometrics   PT Frequency 3x / week   PT Duration 4 weeks   PT Treatment/Interventions Cryotherapy;Electrical Stimulation;Moist Heat;Ultrasound;Patient/family education;Neuromuscular re-education;Therapeutic exercise;Therapeutic activities;Taping;Vasopneumatic Device;Passive range of motion;Scar mobilization;Manual techniques   PT Next Visit Plan Progress A/PROM as tolerated, UE Ranger, try pulleys   Consulted and Agree with Plan of Care Patient      Patient will benefit from skilled therapeutic intervention in order to improve the following deficits and impairments:  Decreased activity tolerance, Decreased mobility, Decreased strength, Increased edema, Decreased scar mobility, Impaired flexibility, Decreased endurance, Decreased coordination, Decreased range of motion, Increased fascial restricitons, Pain  Visit Diagnosis: Acute pain of right  shoulder  Muscle weakness (generalized)  Stiffness of right shoulder, not elsewhere classified  Localized edema     Problem List Patient Active Problem List   Diagnosis Date Noted  . Morbid obesity with body mass index (  BMI) of 50.0 to 59.9 in adult [facility]) 04/14/2016  . Type 2 diabetes mellitus without complications (HCC) 10/05/2015  . Hypersomnia with sleep apnea 06/20/2015  . Right lumbar radiculopathy 03/13/2014  . Occipital headache 01/03/2014  . 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 Benign paroxysmal positional vertigo 02/03/2013  . Ear pain 03/12/2012  . General medical examination 08/06/2011  . SINUS TACHYCARDIA 08/22/2008  . GERD 08/22/2008  . HYPERLIPIDEMIA, MIXED 05/25/2007  . OBESITY, MORBID 05/25/2007  . Bipolar disorder (HCC) 05/25/2007  . MIGRAINE, CHRONIC 05/25/2007  . Essential hypertension 05/25/2007  . Allergic rhinitis 05/25/2007  . Obstructive sleep apnea 05/17/2007    Lorrene Reid, PT 01/14/17 3:23 PM  [facility] [address] [place], [place], [address] Phone: [phone]   Fax:  [phone]  Name: Merie Wulf MRN: [id number] Date of Birth: [birth year]

## 2017-01-16 ENCOUNTER — Ambulatory Visit: Payer: 59 | Admitting: Physical Therapy

## 2017-01-16 ENCOUNTER — Encounter: Payer: Self-pay | Admitting: Physical Therapy

## 2017-01-16 DIAGNOSIS — M6281 Muscle weakness (generalized): Secondary | ICD-10-CM | POA: Diagnosis not present

## 2017-01-16 DIAGNOSIS — M25511 Pain in right shoulder: Secondary | ICD-10-CM

## 2017-01-16 DIAGNOSIS — R600 Localized edema: Secondary | ICD-10-CM

## 2017-01-16 DIAGNOSIS — Z9884 Bariatric surgery status: Secondary | ICD-10-CM | POA: Diagnosis not present

## 2017-01-16 DIAGNOSIS — M25611 Stiffness of right shoulder, not elsewhere classified: Secondary | ICD-10-CM

## 2017-01-16 NOTE — Therapy (Signed)
[facility] [address] [place], [place], [address] Phone: [phone]   Fax:  [phone]  Physical Therapy Treatment  Patient Details  Name: [name] MRN: [id number] Date of Birth: [birth year] Referring Provider: Dr. Malon Kindle  Encounter Date: 01/16/2017      PT End of Session - 01/16/17 1142    Visit Number 6   Date for PT Re-Evaluation 02/02/17   Authorization Type UMR   PT Start Time 1102   PT Stop Time 1150   PT Time Calculation (min) 48 min   Activity Tolerance Patient tolerated treatment well   Behavior During Therapy [facility] for tasks assessed/performed      Past Medical History:  Diagnosis Date  . Allergy   . Asthma    pt stated treated as ashtma but not really asthma  . Bipolar disorder (HCC)   . Chicken pox   . Complication of anesthesia    oxygen saturation dropped after hysterectomy 2009 at [facility]  . Depression   . Diabetes mellitus   . Dyspnea   . GERD (gastroesophageal reflux disease)   . Hyperlipidemia   . Hypertension    readings  . Hypothyroidism   . Irregular heartbeat    saw dr Myrtis Ser sees cardiology as needed  . Migraine   . Sleep apnea     Past Surgical History:  Procedure Laterality Date  . ANKLE SURGERY  1989   left  . BACK SURGERY  1999  . CESAREAN SECTION     1995  . LAPAROSCOPIC ASSISTED VAGINAL HYSTERECTOMY  2009     BSO fibroids, DUB, pelvic pain  . 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 SHOULDER ARTHROSCOPY WITH OPEN ROTATOR CUFF REPAIR Right 01/02/2017   Procedure: Right shoulder arthroscopy, A-subcromial decompression, mini open rotator cuff repair, open distal clavicle resection, biceps tenodesis;  Surgeon: Beverely Low, MD;  Location: [facility] OR;  Service:  Orthopedics;  Laterality: Right;  . THYROIDECTOMY, PARTIAL  2001   removed left  . UMBILICAL HERNIA REPAIR  2003  . WISDOM TOOTH EXTRACTION      There were no vitals filed for this visit.      Subjective Assessment - 01/16/17 1106    Subjective I am still sore.  I have to wear the brace for 4 more weeks.    Patient Stated Goals reduce pain and improve mobility and strength   Currently in Pain? Yes   Pain Score 4    Pain Location Shoulder   Pain Orientation Right   Pain Descriptors / Indicators Sore   Pain Type Surgical pain   Pain Onset More than a month ago   Pain Frequency Constant   Aggravating Factors  movement   Pain Relieving Factors no movement, sitting in recliner   Multiple Pain Sites No                         [facility] Adult PT Treatment/Exercise - 01/16/17 0001      Shoulder Exercises: Seated   Flexion --  using UE Ranger on the floor 2x10   Abduction AAROM;Right  going to patient tolerance using the UE ranger on the floor   Other Seated Exercises elbow flexion 3x 10   Other Seated Exercises small shoulder GH circles 10 fwrd,  10 back      Shoulder Exercises: Pulleys   Flexion 3 minutes   Flexion Limitations therapist guided her     Modalities   Modalities Cryotherapy     Cryotherapy   Cryotherapy Location Shoulder  right   Type of Cryotherapy Ice pack     Manual Therapy   Manual Therapy Soft tissue mobilization;Passive ROM   Soft tissue mobilization right shoulder and biceps   Passive ROM pt reclined: gentle P/ROM into flexion                  PT Short Term Goals - 01/12/17 1222      PT SHORT TERM GOAL #1   Title independent with initial HEP with her husband helping   Time 2   Period Weeks   Status Achieved     PT SHORT TERM GOAL #2   Title understand how to ice the shoulder   Time 4   Period Weeks   Status Achieved     PT SHORT TERM GOAL #3   Title pain in right shoulder decreased >/= 25%   Time 2   Period  Weeks   Status Achieved           PT Long Term Goals - 01/07/17 1740      PT LONG TERM GOAL #1   Title Independent with HEP and how to progress herself   Time 4   Period Weeks   Status On-going     PT LONG TERM GOAL #2   Title right shoulder AROM is WFL so she is able to dress herself without difficulty   Time 4   Period Weeks   Status On-going     PT LONG TERM GOAL #3   Title right shoulder strength is >/= 4/5 so she is able to perform her work tasks without increase in pain   Time 4   Period Weeks   Status On-going     PT LONG TERM GOAL #4   Title right shoulder pain decreased >/= 75% due to improved mobility and reduction in edema   Time 4   Period Weeks   Status On-going     PT LONG TERM GOAL #5   Title able to lift light object like her purse due to right shoulder strength >/= 4/5 and no restrictions from the MD   Time 4   Period Weeks   Status On-going     PT LONG TERM GOAL #6   Title FOTO score </= 54% limitation   Time 4   Period Weeks   Status New               Plan - 01/16/17 1142    Clinical Impression Statement Patient tolerated treatment well. Patient is able to do the overhead pulleys with greater ease.  Patient is still doing AAROM to right shoulder.  Patient is able to sit with her shoulder rested on a pillow.  Patient will benefit from skilled PT for gentle AAROM and PROM progression of right shoulder.     Rehab Potential Excellent   Clinical Impairments Affecting Rehab Potential No shoulder abduction; AAROM to right shoulder gently, isometrics   PT Frequency 3x / week   PT Duration 4 weeks   PT Treatment/Interventions Cryotherapy;Electrical Stimulation;Moist Heat;Ultrasound;Patient/family education;Neuromuscular re-education;Therapeutic exercise;Therapeutic activities;Taping;Vasopneumatic Device;Passive range of motion;Scar mobilization;Manual techniques   PT Next Visit Plan Progress A/PROM as tolerated, UE Ranger, try pulleys; gentle  shoulder Isometrics   PT Home Exercise Plan progress  as needed   Consulted and Agree with Plan of Care Patient      Patient will benefit from skilled therapeutic intervention in order to improve the following deficits and impairments:  Decreased activity tolerance, Decreased mobility, Decreased strength, Increased edema, Decreased scar mobility, Impaired flexibility, Decreased endurance, Decreased coordination, Decreased range of motion, Increased fascial restricitons, Pain  Visit Diagnosis: Acute pain of right shoulder  Muscle weakness (generalized)  Stiffness of right shoulder, not elsewhere classified  Localized edema     Problem List Patient Active Problem List   Diagnosis Date Noted  . Morbid obesity with body mass index (BMI) of 50.0 to 59.9 in adult [facility]) 04/14/2016  . Type 2 diabetes mellitus without complications (HCC) 10/05/2015  . Hypersomnia with sleep apnea 06/20/2015  . Right lumbar radiculopathy 03/13/2014  . Occipital headache 01/03/2014  . 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 Benign paroxysmal positional vertigo 02/03/2013  . Ear pain 03/12/2012  . General medical examination 08/06/2011  . SINUS TACHYCARDIA 08/22/2008  . GERD 08/22/2008  . HYPERLIPIDEMIA, MIXED 05/25/2007  . OBESITY, MORBID 05/25/2007  . Bipolar disorder (HCC) 05/25/2007  . MIGRAINE, CHRONIC 05/25/2007  . Essential hypertension 05/25/2007  . Allergic rhinitis 05/25/2007  . Obstructive sleep apnea 05/17/2007    Eulis Foster, PT 01/16/17 11:47 AM    [facility] [address], [address] [place], [place], [address] Phone: [phone]   Fax:  [phone]  Name: [name] MRN: [id number] Date of Birth: Oct 30, 1970

## 2017-01-19 ENCOUNTER — Ambulatory Visit: Payer: 59 | Admitting: Physical Therapy

## 2017-01-19 DIAGNOSIS — R600 Localized edema: Secondary | ICD-10-CM | POA: Diagnosis not present

## 2017-01-19 DIAGNOSIS — M25611 Stiffness of right shoulder, not elsewhere classified: Secondary | ICD-10-CM | POA: Diagnosis not present

## 2017-01-19 DIAGNOSIS — M6281 Muscle weakness (generalized): Secondary | ICD-10-CM | POA: Diagnosis not present

## 2017-01-19 DIAGNOSIS — M25511 Pain in right shoulder: Secondary | ICD-10-CM

## 2017-01-19 NOTE — Therapy (Signed)
[facility] [address] [place], [place], [address] Phone: [phone]   Fax:  [phone]  Physical Therapy Treatment  Patient Details  Name: [name] MRN: [id number] Date of Birth: [birth year] Referring Provider: Dr. Malon Kindle  Encounter Date: 01/19/2017      PT End of Session - 01/19/17 1402    Visit Number 7   Date for PT Re-Evaluation 02/02/17   Authorization Type UMR   PT Start Time 1400   PT Stop Time 1450   PT Time Calculation (min) 50 min   Activity Tolerance Patient tolerated treatment well      Past Medical History:  Diagnosis Date  . Allergy   . Asthma    pt stated treated as ashtma but not really asthma  . Bipolar disorder (HCC)   . Chicken pox   . Complication of anesthesia    oxygen saturation dropped after hysterectomy 2009 at [facility]  . Depression   . Diabetes mellitus   . Dyspnea   . GERD (gastroesophageal reflux disease)   . Hyperlipidemia   . Hypertension    readings  . Hypothyroidism   . Irregular heartbeat    saw dr Myrtis Ser sees cardiology as needed  . Migraine   . Sleep apnea     Past Surgical History:  Procedure Laterality Date  . ANKLE SURGERY  1989   left  . BACK SURGERY  1999  . CESAREAN SECTION     1995  . LAPAROSCOPIC ASSISTED VAGINAL HYSTERECTOMY  2009     BSO fibroids, DUB, pelvic pain  . 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 SHOULDER ARTHROSCOPY WITH OPEN ROTATOR CUFF REPAIR Right 01/02/2017   Procedure: Right shoulder arthroscopy, A-subcromial decompression, mini open rotator cuff repair, open distal clavicle resection, biceps tenodesis;  Surgeon: Beverely Low, MD;  Location: [facility] OR;  Service: Orthopedics;  Laterality: Right;  . THYROIDECTOMY, PARTIAL  2001    removed left  . UMBILICAL HERNIA REPAIR  2003  . WISDOM TOOTH EXTRACTION      There were no vitals filed for this visit.      Subjective Assessment - 01/19/17 1404    Subjective Just a little sore, not bad today.    Currently in Pain? Yes   Pain Score 2    Pain Location Shoulder   Pain Orientation Right   Multiple Pain Sites No                         [facility] Adult PT Treatment/Exercise - 01/19/17 0001      Shoulder Exercises: Supine   Other Supine Exercises with cane: straightening elbows 2x5      Shoulder Exercises: Seated   Flexion --  using UE Ranger on the floor 2x10: flexion and ER   Other Seated Exercises scap squeezes 3 sec 10x     Shoulder Exercises: Standing   Flexion --  On finger ladder 6x reaching #9     Shoulder Exercises: Pulleys   Flexion 3 minutes     Shoulder Exercises: Stretch   Other Shoulder Stretches Blue ball asst forwarrd flexion 2x10 stretch     Cryotherapy   Number Minutes Cryotherapy 10 Minutes   Cryotherapy Location Shoulder   Type of Cryotherapy Ice pack  Manual Therapy   Soft tissue mobilization right shoulder and biceps   Passive ROM pt reclined: gentle P/ROM into flexion and ER  Elbow flexion extension in supination                PT Education - 01/19/17 1423    Education provided Yes   Education Details HEP: wall walks and supine cane press overhead at [employer]) Educated Patient   Methods Explanation;Demonstration;Tactile cues;Verbal cues   Comprehension Verbalized understanding;Returned demonstration          PT Short Term Goals - 01/12/17 1222      PT SHORT TERM GOAL #1   Title independent with initial HEP with her husband helping   Time 2   Period Weeks   Status Achieved     PT SHORT TERM GOAL #2   Title understand how to ice the shoulder   Time 4   Period Weeks   Status Achieved     PT SHORT TERM GOAL #3   Title pain in right shoulder decreased >/= 25%   Time 2    Period Weeks   Status Achieved           PT Long Term Goals - 01/07/17 1740      PT LONG TERM GOAL #1   Title Independent with HEP and how to progress herself   Time 4   Period Weeks   Status On-going     PT LONG TERM GOAL #2   Title right shoulder AROM is WFL so she is able to dress herself without difficulty   Time 4   Period Weeks   Status On-going     PT LONG TERM GOAL #3   Title right shoulder strength is >/= 4/5 so she is able to perform her work tasks without increase in pain   Time 4   Period Weeks   Status On-going     PT LONG TERM GOAL #4   Title right shoulder pain decreased >/= 75% due to improved mobility and reduction in edema   Time 4   Period Weeks   Status On-going     PT LONG TERM GOAL #5   Title able to lift light object like her purse due to right shoulder strength >/= 4/5 and no restrictions from the MD   Time 4   Period Weeks   Status On-going     PT LONG TERM GOAL #6   Title FOTO score </= 54% limitation   Time 4   Period Weeks   Status New               Plan - 01/19/17 1403    Clinical Impression Statement Showing great progress today achieving almost 90 degees of shoulder flexion with PROM. External rotation eyeballed around 20 degrees PROM. Added to HEP to today to progress .    Rehab Potential Excellent   Clinical Impairments Affecting Rehab Potential No shoulder abduction; AAROM to right shoulder gently, isometrics   PT Frequency 3x / week   PT Duration 4 weeks   PT Next Visit Plan Progress A/P/ PROM as tolerated, UE Ranger, try pulleys; gentle shoulder Isometrics   PT Home Exercise Plan progress as needed   Consulted and Agree with Plan of Care Patient      Patient will benefit from skilled therapeutic intervention in order to improve the following deficits and impairments:  Decreased activity tolerance, Decreased mobility, Decreased strength, Increased edema, Decreased scar mobility, Impaired flexibility,  Decreased  endurance, Decreased coordination, Decreased range of motion, Increased fascial restricitons, Pain  Visit Diagnosis: Acute pain of right shoulder  Muscle weakness (generalized)  Stiffness of right shoulder, not elsewhere classified  Localized edema     Problem List Patient Active Problem List   Diagnosis Date Noted  . Morbid obesity with body mass index (BMI) of 50.0 to 59.9 in adult [facility]) 04/14/2016  . Type 2 diabetes mellitus without complications (HCC) 10/05/2015  . Hypersomnia with sleep apnea 06/20/2015  . Right lumbar radiculopathy 03/13/2014  . Occipital headache 01/03/2014  . 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 Benign paroxysmal positional vertigo 02/03/2013  . Ear pain 03/12/2012  . General medical examination 08/06/2011  . SINUS TACHYCARDIA 08/22/2008  . GERD 08/22/2008  . HYPERLIPIDEMIA, MIXED 05/25/2007  . OBESITY, MORBID 05/25/2007  . Bipolar disorder (HCC) 05/25/2007  . MIGRAINE, CHRONIC 05/25/2007  . Essential hypertension 05/25/2007  . Allergic rhinitis 05/25/2007  . Obstructive sleep apnea 05/17/2007    Jayln Madeira, PTA 01/19/2017, 2:42 PM  [facility] [address] [place], [place], [address] Phone: [phone]   Fax:  [phone]  Name: [name] MRN: [id number] Date of Birth: 03-19-1971

## 2017-01-21 ENCOUNTER — Ambulatory Visit: Payer: 59

## 2017-01-21 DIAGNOSIS — M25611 Stiffness of right shoulder, not elsewhere classified: Secondary | ICD-10-CM | POA: Diagnosis not present

## 2017-01-21 DIAGNOSIS — M6281 Muscle weakness (generalized): Secondary | ICD-10-CM | POA: Diagnosis not present

## 2017-01-21 DIAGNOSIS — M25511 Pain in right shoulder: Secondary | ICD-10-CM | POA: Diagnosis not present

## 2017-01-21 DIAGNOSIS — R600 Localized edema: Secondary | ICD-10-CM | POA: Diagnosis not present

## 2017-01-21 MED FILL — HYDROCODON-APAP 5-325: 5-325 | 5 days supply | Qty: 30 | Fill #0

## 2017-01-21 NOTE — Patient Instructions (Addendum)
Strengthening: Isometric Flexion  Using wall for resistance, press right fist into ball using light pressure. Hold _5___ seconds. Repeat _10___ times per set. Do __2__ sets per session. Do _1-2___ sessions per day.     Extension (Isometric)  Place left bent elbow and back of arm against wall. Press elbow against wall. Hold _5___ seconds. Repeat _2x10___ times. Do __1-2__ sessions per day.   Adduction (Isometric)    Press upper arm against side of trunk. Hold _5___ seconds. Relax. Repeat _2x10___ times. Do _1-2___ sessions per day.  [facility] [address], [address] [place], [place] [address] Phone # [phone] Fax [phone]

## 2017-01-21 NOTE — Therapy (Signed)
[facility] [address], [address] [place], [place], [address] Phone: [phone]   Fax:  [phone]  Physical Therapy Treatment  Patient Details  Name: [name] MRN: [id number] Date of Birth: Feb 01, 1971 Referring Provider: Dr. Malon KindleSteven Norris  Encounter Date: 01/21/2017      PT End of Session - 01/21/17 1443    Visit Number 8   Date for PT Re-Evaluation 02/02/17   PT Start Time 1400   PT Stop Time 1453   PT Time Calculation (min) 53 min   Activity Tolerance Patient tolerated treatment well   Behavior During Therapy [facility] for tasks assessed/performed      Past Medical History:  Diagnosis Date  . Allergy   . Asthma    pt stated treated as ashtma but not really asthma  . Bipolar disorder (HCC)   . Chicken pox   . Complication of anesthesia    [facility]  . Depression   . Diabetes mellitus   . Dyspnea   . GERD (gastroesophageal reflux disease)   . Hyperlipidemia   . Hypertension    readings  . Hypothyroidism   . Irregular heartbeat    saw dr Myrtis SerKatz sees cardiology as needed  . Migraine   . Sleep apnea     Past Surgical History:  Procedure Laterality Date  . ANKLE SURGERY  1989   left  . BACK SURGERY  1999  . CESAREAN SECTION     1995  . LAPAROSCOPIC ASSISTED VAGINAL HYSTERECTOMY  2009     BSO fibroids, DUB, pelvic pain  . 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 SHOULDER ARTHROSCOPY WITH OPEN ROTATOR CUFF REPAIR Right 01/02/2017   Procedure: Right shoulder arthroscopy, A-subcromial decompression, mini open rotator cuff repair, open distal clavicle resection, biceps tenodesis;  Surgeon: Beverely LowNorris, Steve, MD;  [facility];  Service: Orthopedics;  Laterality: Right;   . THYROIDECTOMY, PARTIAL  2001   removed left  . UMBILICAL HERNIA REPAIR  2003  . WISDOM TOOTH EXTRACTION      There were no vitals filed for this visit.      Subjective Assessment - 01/21/17 1404    Subjective Pt with Lt scapular pain reported.  Rt arm is doing well.     Patient Stated Goals reduce pain and improve mobility and strength   Currently in Pain? Yes   Pain Score 3    Pain Location Shoulder   Pain Orientation Right   Pain Descriptors / Indicators Sore   Pain Type Surgical pain   Pain Onset More than a month ago   Pain Frequency Constant                         [facility] Adult PT Treatment/Exercise - 01/21/17 0001      Shoulder Exercises: Supine   Other Supine Exercises with cane: straightening elbows 2x5      Shoulder Exercises: Seated   Flexion --  using UE Ranger on the floor 2x10: flexion and ER   Other Seated Exercises scap squeezes 3 sec 10x     Shoulder Exercises: Standing   Flexion AAROM;Right;10 reps  2x10 to #10     Shoulder Exercises: Pulleys   Flexion 3 minutes  2x3 minutes  Shoulder Exercises: Isometric Strengthening   Flexion Other (comment)  5" hold x 20   Extension Other (comment)  5" hold x 20   ADduction Other (comment)  5" hold x 20     Cryotherapy   Number Minutes Cryotherapy 10 Minutes   Cryotherapy Location Shoulder   Type of Cryotherapy Ice pack     Manual Therapy   Soft tissue mobilization right shoulder and biceps   Passive ROM pt reclined: gentle P/ROM into flexion and ER  Elbow flexion extension in supination                PT Education - 01/21/17 1417    Education provided Yes   Education Details isometrics   Person(s) Educated Patient   Methods Explanation;Demonstration;Handout   Comprehension Verbalized understanding;Returned demonstration          PT Short Term Goals - 01/12/17 1222      PT SHORT TERM GOAL #1   Title independent with initial HEP with her husband helping    Time 2   Period Weeks   Status Achieved     PT SHORT TERM GOAL #2   Title understand how to ice the shoulder   Time 4   Period Weeks   Status Achieved     PT SHORT TERM GOAL #3   Title pain in right shoulder decreased >/= 25%   Time 2   Period Weeks   Status Achieved           PT Long Term Goals - 01/07/17 1740      PT LONG TERM GOAL #1   Title Independent with HEP and how to progress herself   Time 4   Period Weeks   Status On-going     PT LONG TERM GOAL #2   Title right shoulder AROM is WFL so she is able to dress herself without difficulty   Time 4   Period Weeks   Status On-going     PT LONG TERM GOAL #3   Title right shoulder strength is >/= 4/5 so she is able to perform her work tasks without increase in pain   Time 4   Period Weeks   Status On-going     PT LONG TERM GOAL #4   Title right shoulder pain decreased >/= 75% due to improved mobility and reduction in edema   Time 4   Period Weeks   Status On-going     PT LONG TERM GOAL #5   Title able to lift light object like her purse due to right shoulder strength >/= 4/5 and no restrictions from the MD   Time 4   Period Weeks   Status On-going     PT LONG TERM GOAL #6   Title FOTO score </= 54% limitation   Time 4   Period Weeks   Status New               Plan - 01/21/17 1405    Clinical Impression Statement Pt is making steady progress regarding A/AROM.  Pt able to get to ~90 degrees flexion with pulleys.  Pt tolerated isometrics into flexion, extension and adduction well today.  Pt will continue to benefit from skilled PT for Rt shoulder isometric and A/AROM, PROM as tolerated .     Rehab Potential Excellent   Clinical Impairments Affecting Rehab Potential No shoulder abduction; AAROM to right shoulder gently, isometrics   PT Frequency 3x / week   PT Duration 4 weeks  PT Treatment/Interventions Cryotherapy;Electrical Stimulation;Moist Heat;Ultrasound;Patient/family  education;Neuromuscular re-education;Therapeutic exercise;Therapeutic activities;Taping;Vasopneumatic Device;Passive range of motion;Scar mobilization;Manual techniques   PT Next Visit Plan Progress A/P/ PROM as tolerated, UE Ranger, pulleys; gentle shoulder Isometrics   Consulted and Agree with Plan of Care Patient      Patient will benefit from skilled therapeutic intervention in order to improve the following deficits and impairments:  Decreased activity tolerance, Decreased mobility, Decreased strength, Increased edema, Decreased scar mobility, Impaired flexibility, Decreased endurance, Decreased coordination, Decreased range of motion, Increased fascial restricitons, Pain  Visit Diagnosis: Acute pain of right shoulder  Muscle weakness (generalized)  Stiffness of right shoulder, not elsewhere classified     Problem List Patient Active Problem List   Diagnosis Date Noted  . Morbid obesity with body mass index (BMI) of 50.0 to 59.9 in adult [facility]) 04/14/2016  . Type 2 diabetes mellitus without complications (HCC) 10/05/2015  . Hypersomnia with sleep apnea 06/20/2015  . Right lumbar radiculopathy 03/13/2014  . Occipital headache 01/03/2014  . 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 Benign paroxysmal positional vertigo 02/03/2013  . Ear pain 03/12/2012  . General medical examination 08/06/2011  . SINUS TACHYCARDIA 08/22/2008  . GERD 08/22/2008  . HYPERLIPIDEMIA, MIXED 05/25/2007  . OBESITY, MORBID 05/25/2007  . Bipolar disorder (HCC) 05/25/2007  . MIGRAINE, CHRONIC 05/25/2007  . Essential hypertension 05/25/2007  . Allergic rhinitis 05/25/2007  . Obstructive sleep apnea 05/17/2007     Lorrene Reid, PT 01/21/17 2:45 PM  [facility] [address] [place], [place], [address] Phone: [phone]   Fax:  [phone]  Name: [name] MRN: [id number] Date of Birth: 04-12-1971

## 2017-01-22 ENCOUNTER — Encounter: Payer: 59 | Attending: General Surgery | Admitting: Registered"

## 2017-01-22 ENCOUNTER — Encounter: Payer: Self-pay | Admitting: Registered"

## 2017-01-22 DIAGNOSIS — Z713 Dietary counseling and surveillance: Secondary | ICD-10-CM | POA: Diagnosis not present

## 2017-01-22 DIAGNOSIS — E119 Type 2 diabetes mellitus without complications: Secondary | ICD-10-CM

## 2017-01-22 NOTE — Progress Notes (Signed)
Surgery date: 04/14/2016 Surgery type: RYGB [facility]: 358 lbs on 02/28/2016 Weight today: 242.8 lbs (pt reported; declined weight due to wearing sling from shoulder surgery) Weight change: 8.8 lbs loss from 251.6 on 11/05/2016 Total weight loss from start weight: 115.2 lbs Weight loss goals: lose 20 more lbs (per surgeon's estimation), going back to gym, healthy blood sugar and blood pressure levels  TANITA  BODY COMP RESULTS  03/31/16 04/29/16 07/31/2016 11/05/2016   BMI (kg/m^2) 53 47.4 41.4 37.1   Fat Mass (lbs) 192.2 185.6 136.4 110.6   Fat Free Mass (lbs) 166.2 135.6 144.2 140.8   Total Body Water (lbs) 125.4 102.2 106.6 103.0   Medical Nutrition Therapy:  Appt start time: 11:50 end time:  12:15  Primary concerns today: Post-operative Bariatric Surgery Nutrition Management.  Pt states she had shoulder surgery 3 wks ago, wears sling, and unable to do things with her dominant side. Pt states she is out of work for 3 more weeks. Pt states she was able to have surgery due to having weight loss surgery. Pt states she will be in sling until 9/18. Pt reports increased fluid intake by drinking about 2 oz every 15 mins. Pt states she is drinking about 50-60 oz fluid per day. Pt states she added in peanut butter crackers to her diet due to needing them to take with medications (oxycodone and hydrocodone) related to shoulder surgery. Pt states when she is released to no longer need them, she is going to remove crackers from her diet again. Pt states she has physical therapy 3x/week. Pt states she is released to walk but does not want to use treadmill due to wearing sling. Pt is doing well overall.    24-hr recall: B (AM): 1.5 egg (12g) or peanut butter crackers (with meds) Snk (AM): greek yogurt (15g) L (PM): beans or chicken (21g) with peanut butter crackers (with meds) Snk (PM): peanut butter crackers (with meds) or greek yogurt (12g) and vegetables  D (PM): meat (21g) + vegetables  (broccoli, zucchini, carrots) Snk (PM): peanut butter crackers   Fluid intake: water, flavored water; about 50-60 oz 42 ounces Estimated total protein intake: 78 grams protein   Medications: See List Supplementation: taking bari multi and calcium  CBG monitoring: 1 time a day  Average CBG per patient: fasting 100-125 Last patient reported A1c: 3 wks ago 6.6  Using straws: no Drinking while eating: no Having you been chewing well: yes Chewing/swallowing difficulties:  no Changes in vision: yes: no Changes to mood/headaches: no Hair loss/Changes to skin/Changes to nails: some hair thinnin, began biotin  Any difficulty focusing or concentrating: no Sweating: no Dizziness/Lightheaded: no Palpitations: no Carbonated beverages: no N/V/D/C/GAS: no, no, no, YES due to medication; taking colace 1-2 times per month, no Abdominal Pain: no   Recent physical activity: Physical therapy 60 min, 3x/week  Progress Towards Goal(s):  In progress.  Handouts given during visit include:  none    Nutritional Diagnosis:  [place]-3.3 Overweight/obesity related to past poor dietary habits and physical inactivity as evidenced by patient w/ recent RYGB surgery following dietary guidelines for continued weight loss.  Intervention:  Nutrition counseling.  Goals: - Track protein intake with Baritastic App; add in protein drink as supplement if needed.  - Continue to increase fluid to at least 64 oz per day.  - Try to reduce weighing-in to once a week.  - Keep up the great work!  Teaching Method Utilized:  Visual Auditory Hands on  Barriers  to learning/adherence to lifestyle change: none identified   Demonstrated degree of understanding via:  Teach Back   Monitoring/Evaluation:  Dietary intake, exercise, lap band fills, and body weight. Follow-up in 3 months for 1 year post-op nutrition visit.

## 2017-01-22 NOTE — Patient Instructions (Addendum)
-   Track protein intake with Baritastic App; add in protein drink as supplement if needed.   - Continue to increase fluid to at least 64 oz per day.   - Try to reduce weighing-in to once a week.   - Keep up the great work!

## 2017-01-23 ENCOUNTER — Ambulatory Visit: Payer: 59 | Admitting: Physical Therapy

## 2017-01-23 ENCOUNTER — Encounter: Payer: Self-pay | Admitting: Physical Therapy

## 2017-01-23 DIAGNOSIS — M25611 Stiffness of right shoulder, not elsewhere classified: Secondary | ICD-10-CM

## 2017-01-23 DIAGNOSIS — M6281 Muscle weakness (generalized): Secondary | ICD-10-CM | POA: Diagnosis not present

## 2017-01-23 DIAGNOSIS — R600 Localized edema: Secondary | ICD-10-CM

## 2017-01-23 DIAGNOSIS — M25511 Pain in right shoulder: Secondary | ICD-10-CM

## 2017-01-23 MED FILL — metFORMIN HCL 500 MG TABS: 500 | 90 days supply | Qty: 180 | Fill #1

## 2017-01-23 MED FILL — METHOCARBAMOL 500 MG TABLET: 500 | 20 days supply | Qty: 60 | Fill #1

## 2017-01-23 NOTE — Therapy (Signed)
[facility] [address] [place], [place], [address] Phone: [phone]   Fax:  [phone]  Physical Therapy Treatment  Patient Details  Name: [name] MRN: [id number] Date of Birth: [birth year] Referring Provider: Dr. Malon Kindle  Encounter Date: 01/23/2017      PT End of Session - 01/23/17 1022    Visit Number 9   Date for PT Re-Evaluation 02/02/17   Authorization Type UMR   PT Start Time 1017   PT Stop Time 1110   PT Time Calculation (min) 53 min   Activity Tolerance Patient tolerated treatment well   Behavior During Therapy [facility] for tasks assessed/performed      Past Medical History:  Diagnosis Date  . Allergy   . Asthma    pt stated treated as ashtma but not really asthma  . Bipolar disorder (HCC)   . Chicken pox   . Complication of anesthesia    oxygen saturation dropped after hysterectomy 2009 at [facility]  . Depression   . Diabetes mellitus   . Dyspnea   . GERD (gastroesophageal reflux disease)   . Hyperlipidemia   . Hypertension    readings  . Hypothyroidism   . Irregular heartbeat    saw dr Myrtis Ser sees cardiology as needed  . Migraine   . Sleep apnea     Past Surgical History:  Procedure Laterality Date  . ANKLE SURGERY  1989   left  . BACK SURGERY  1999  . CESAREAN SECTION     1995  . LAPAROSCOPIC ASSISTED VAGINAL HYSTERECTOMY  2009     BSO fibroids, DUB, pelvic pain  . 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 SHOULDER ARTHROSCOPY WITH OPEN ROTATOR CUFF REPAIR Right 01/02/2017   Procedure: Right shoulder arthroscopy, A-subcromial decompression, mini open rotator cuff repair, open distal clavicle resection, biceps tenodesis;  Surgeon: Beverely Low, MD;  [facility];  Service:  Orthopedics;  Laterality: Right;  . THYROIDECTOMY, PARTIAL  2001   removed left  . UMBILICAL HERNIA REPAIR  2003  . WISDOM TOOTH EXTRACTION      There were no vitals filed for this visit.      Subjective Assessment - 01/23/17 1021    Subjective I can do things with less pain in my shoulder and my shoulder does not hurt as bad after.    Currently in Pain? No/denies   Multiple Pain Sites No            [facility] PT Assessment - 01/23/17 0001      PROM   Right Shoulder Flexion 125 Degrees   Right Shoulder External Rotation 30 Degrees   Right Elbow Extension 0                     [facility] Adult PT Treatment/Exercise - 01/23/17 0001      Shoulder Exercises: Seated   Flexion --  using UE Ranger on the floor 3x10: flexion and ER   Theraband Level (Shoulder Flexion) --  clasped fingers 10x   Other Seated Exercises scap squeezes 3 sec 10x     Shoulder Exercises: Standing   Flexion AAROM;Right;10 reps  2x10 to #12     Shoulder Exercises: Pulleys   Flexion 3 minutes  2x3 minutes     Cryotherapy   Number  Minutes Cryotherapy 10 Minutes   Cryotherapy Location Shoulder   Type of Cryotherapy Ice pack     Manual Therapy   Soft tissue mobilization right shoulder and biceps   Passive ROM pt reclined: gentle P/ROM into flexion and ER  Elbow flexion extension in supination                  PT Short Term Goals - 01/12/17 1222      PT SHORT TERM GOAL #1   Title independent with initial HEP with her husband helping   Time 2   Period Weeks   Status Achieved     PT SHORT TERM GOAL #2   Title understand how to ice the shoulder   Time 4   Period Weeks   Status Achieved     PT SHORT TERM GOAL #3   Title pain in right shoulder decreased >/= 25%   Time 2   Period Weeks   Status Achieved           PT Long Term Goals - 01/07/17 1740      PT LONG TERM GOAL #1   Title Independent with HEP and how to progress herself   Time 4   Period Weeks   Status  On-going     PT LONG TERM GOAL #2   Title right shoulder AROM is WFL so she is able to dress herself without difficulty   Time 4   Period Weeks   Status On-going     PT LONG TERM GOAL #3   Title right shoulder strength is >/= 4/5 so she is able to perform her work tasks without increase in pain   Time 4   Period Weeks   Status On-going     PT LONG TERM GOAL #4   Title right shoulder pain decreased >/= 75% due to improved mobility and reduction in edema   Time 4   Period Weeks   Status On-going     PT LONG TERM GOAL #5   Title able to lift light object like her purse due to right shoulder strength >/= 4/5 and no restrictions from the MD   Time 4   Period Weeks   Status On-going     PT LONG TERM GOAL #6   Title FOTO score </= 54% limitation   Time 4   Period Weeks   Status New               Plan - 01/23/17 1023    Clinical Impression Statement P/ROM significantly improved since eval. Measurements in chart. Some pain limiting PROm around 100-120 degrees flexion.  Pt compliant with HEP and aniticipates MD will DC sling on the 18th .   Rehab Potential Excellent   Clinical Impairments Affecting Rehab Potential No shoulder abduction; AAROM to right shoulder gently, isometrics   PT Frequency 3x / week   PT Duration 4 weeks   PT Treatment/Interventions Cryotherapy;Electrical Stimulation;Moist Heat;Ultrasound;Patient/family education;Neuromuscular re-education;Therapeutic exercise;Therapeutic activities;Taping;Vasopneumatic Device;Passive range of motion;Scar mobilization;Manual techniques   PT Next Visit Plan Progress A/P/ PROM as tolerated, UE Ranger, pulleys; gentle shoulder Isometrics   Consulted and Agree with Plan of Care Patient      Patient will benefit from skilled therapeutic intervention in order to improve the following deficits and impairments:  Decreased activity tolerance, Decreased mobility, Decreased strength, Increased edema, Decreased scar mobility,  Impaired flexibility, Decreased endurance, Decreased coordination, Decreased range of motion, Increased fascial restricitons, Pain  Visit Diagnosis: Acute pain of right  shoulder  Muscle weakness (generalized)  Localized edema  Stiffness of right shoulder, not elsewhere classified     Problem List Patient Active Problem List   Diagnosis Date Noted  . Morbid obesity with body mass index (BMI) of 50.0 to 59.9 in adult [facility]) 04/14/2016  . Type 2 diabetes mellitus without complications (HCC) 10/05/2015  . Hypersomnia with sleep apnea 06/20/2015  . Right lumbar radiculopathy 03/13/2014  . Occipital headache 01/03/2014  . 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 Benign paroxysmal positional vertigo 02/03/2013  . Ear pain 03/12/2012  . General medical examination 08/06/2011  . SINUS TACHYCARDIA 08/22/2008  . GERD 08/22/2008  . HYPERLIPIDEMIA, MIXED 05/25/2007  . OBESITY, MORBID 05/25/2007  . Bipolar disorder (HCC) 05/25/2007  . MIGRAINE, CHRONIC 05/25/2007  . Essential hypertension 05/25/2007  . Allergic rhinitis 05/25/2007  . Obstructive sleep apnea 05/17/2007    Tacora Athanas, PTA 01/23/2017, 10:56 AM  [facility] [address], [address] [place], [place], [address] Phone: [phone]   Fax:  [phone]  Name: [name] MRN: [id number] Date of Birth: Jan 22, 1971

## 2017-01-28 ENCOUNTER — Encounter: Payer: Self-pay | Admitting: Physical Therapy

## 2017-01-28 ENCOUNTER — Ambulatory Visit: Payer: 59 | Attending: Orthopedic Surgery | Admitting: Physical Therapy

## 2017-01-28 DIAGNOSIS — M6281 Muscle weakness (generalized): Secondary | ICD-10-CM | POA: Insufficient documentation

## 2017-01-28 DIAGNOSIS — M25611 Stiffness of right shoulder, not elsewhere classified: Secondary | ICD-10-CM | POA: Diagnosis not present

## 2017-01-28 DIAGNOSIS — R600 Localized edema: Secondary | ICD-10-CM | POA: Insufficient documentation

## 2017-01-28 DIAGNOSIS — M25511 Pain in right shoulder: Secondary | ICD-10-CM | POA: Insufficient documentation

## 2017-01-28 NOTE — Therapy (Signed)
[facility] [address] [place], [place], [address] Phone: [phone]   Fax:  [phone]  Physical Therapy Treatment  Patient Details  Name: [name] MRN: [id number] Date of Birth: [birth year] Referring Provider: Dr. Malon Kindle  Encounter Date: 01/28/2017      PT End of Session - 01/28/17 1407    Visit Number 10   Date for PT Re-Evaluation 02/02/17   Authorization Type UMR   PT Start Time 1400   PT Stop Time 1455   PT Time Calculation (min) 55 min   Activity Tolerance Patient tolerated treatment well   Behavior During Therapy [facility] for tasks assessed/performed      Past Medical History:  Diagnosis Date  . Allergy   . Asthma    pt stated treated as ashtma but not really asthma  . Bipolar disorder (HCC)   . Chicken pox   . Complication of anesthesia    oxygen saturation dropped after hysterectomy 2009 at [facility]  . Depression   . Diabetes mellitus   . Dyspnea   . GERD (gastroesophageal reflux disease)   . Hyperlipidemia   . Hypertension    readings  . Hypothyroidism   . Irregular heartbeat    saw dr Myrtis Ser sees cardiology as needed  . Migraine   . Sleep apnea     Past Surgical History:  Procedure Laterality Date  . ANKLE SURGERY  1989   left  . BACK SURGERY  1999  . CESAREAN SECTION     1995  . LAPAROSCOPIC ASSISTED VAGINAL HYSTERECTOMY  2009     BSO fibroids, DUB, pelvic pain  . 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 SHOULDER ARTHROSCOPY WITH OPEN ROTATOR CUFF REPAIR Right 01/02/2017   Procedure: Right shoulder arthroscopy, A-subcromial decompression, mini open rotator cuff repair, open distal clavicle resection, biceps tenodesis;  Surgeon: Beverely Low, MD;  Location: [facility] OR;  Service:  Orthopedics;  Laterality: Right;  . THYROIDECTOMY, PARTIAL  2001   removed left  . UMBILICAL HERNIA REPAIR  2003  . WISDOM TOOTH EXTRACTION      There were no vitals filed for this visit.      Subjective Assessment - 01/28/17 1406    Subjective I am a little tighter from missing PT Monday. I got home pulleys and are now using them.    Patient Stated Goals reduce pain and improve mobility and strength   Currently in Pain? Yes   Pain Score 3    Pain Location Shoulder   Pain Orientation Left   Pain Descriptors / Indicators Sore   Aggravating Factors  Stetching too far   Pain Relieving Factors rest, pulleys, ice   Multiple Pain Sites No            [facility] PT Assessment - 01/28/17 0001      PROM   Right Shoulder External Rotation 40 Degrees                     [facility] Adult PT Treatment/Exercise - 01/28/17 0001      Shoulder Exercises: Supine   Other Supine Exercises with cane: straightening elbows 10x  Added overhead cane flexion     Shoulder Exercises: Seated   Other Seated Exercises scap squeezes 3 sec 10x     Shoulder Exercises:  Standing   Flexion AAROM;Right;10 reps  2x10 to #13   Other Standing Exercises UE Ranger 10x with min mod asst by PTA mainly for deceleration     Shoulder Exercises: Pulleys   Flexion 3 minutes  2x3 minutes     Shoulder Exercises: Isometric Strengthening   Flexion Other (comment)  5" hold x 20   Extension 5X10"  elbow bent into ball standing   ADduction Other (comment)  5" hold x 20     Cryotherapy   Number Minutes Cryotherapy 10 Minutes   Cryotherapy Location Shoulder   Type of Cryotherapy Ice pack     Manual Therapy   Passive ROM pt reclined: gentle P/ROM into flexion and ER  Elbow flexion extension in supination                PT Education - 01/28/17 1425    Education provided Yes   Education Details Supine cane flexion overhead   Person(s) Educated Patient   Methods  Explanation;Demonstration;Tactile cues;Verbal cues   Comprehension Verbalized understanding;Returned demonstration          PT Short Term Goals - 01/12/17 1222      PT SHORT TERM GOAL #1   Title independent with initial HEP with her husband helping   Time 2   Period Weeks   Status Achieved     PT SHORT TERM GOAL #2   Title understand how to ice the shoulder   Time 4   Period Weeks   Status Achieved     PT SHORT TERM GOAL #3   Title pain in right shoulder decreased >/= 25%   Time 2   Period Weeks   Status Achieved           PT Long Term Goals - 01/07/17 1740      PT LONG TERM GOAL #1   Title Independent with HEP and how to progress herself   Time 4   Period Weeks   Status On-going     PT LONG TERM GOAL #2   Title right shoulder AROM is WFL so she is able to dress herself without difficulty   Time 4   Period Weeks   Status On-going     PT LONG TERM GOAL #3   Title right shoulder strength is >/= 4/5 so she is able to perform her work tasks without increase in pain   Time 4   Period Weeks   Status On-going     PT LONG TERM GOAL #4   Title right shoulder pain decreased >/= 75% due to improved mobility and reduction in edema   Time 4   Period Weeks   Status On-going     PT LONG TERM GOAL #5   Title able to lift light object like her purse due to right shoulder strength >/= 4/5 and no restrictions from the MD   Time 4   Period Weeks   Status On-going     PT LONG TERM GOAL #6   Title FOTO score </= 54% limitation   Time 4   Period Weeks   Status New               Plan - 01/28/17 1408    Clinical Impression Statement Pain around 120 degrees of shoulder flexion but it seems to be mainly from tighteness. Pt reports that all her exercises are getting easier and her scapular adduction is stronger. Passive ER improved today.    Rehab Potential Excellent   Clinical  Impairments Affecting Rehab Potential No shoulder abduction; AAROM to right shoulder  gently, isometrics   PT Frequency 3x / week   PT Duration 4 weeks   PT Next Visit Plan Progress A/P/ PROM as tolerated, UE Ranger, pulleys; gentle shoulder Isometrics   Consulted and Agree with Plan of Care Patient      Patient will benefit from skilled therapeutic intervention in order to improve the following deficits and impairments:  Decreased activity tolerance, Decreased mobility, Decreased strength, Increased edema, Decreased scar mobility, Impaired flexibility, Decreased endurance, Decreased coordination, Decreased range of motion, Increased fascial restricitons, Pain  Visit Diagnosis: Acute pain of right shoulder  Muscle weakness (generalized)  Localized edema  Stiffness of right shoulder, not elsewhere classified     Problem List Patient Active Problem List   Diagnosis Date Noted  . Morbid obesity with body mass index (BMI) of 50.0 to 59.9 in adult [facility]) 04/14/2016  . Type 2 diabetes mellitus without complications (HCC) 10/05/2015  . Hypersomnia with sleep apnea 06/20/2015  . Right lumbar radiculopathy 03/13/2014  . Occipital headache 01/03/2014  . 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 Benign paroxysmal positional vertigo 02/03/2013  . Ear pain 03/12/2012  . General medical examination 08/06/2011  . SINUS TACHYCARDIA 08/22/2008  . GERD 08/22/2008  . HYPERLIPIDEMIA, MIXED 05/25/2007  . OBESITY, MORBID 05/25/2007  . Bipolar disorder (HCC) 05/25/2007  . MIGRAINE, CHRONIC 05/25/2007  . Essential hypertension 05/25/2007  . Allergic rhinitis 05/25/2007  . Obstructive sleep apnea 05/17/2007    Raquel Sayres,PTA 01/28/2017, 2:42 PM  [facility] [address] [place], [place], [address] Phone: [phone]   Fax:  [phone]  Name: Chyna Kneece MRN: [id number] Date of Birth: 04-Jul-1970

## 2017-01-30 ENCOUNTER — Telehealth: Payer: Self-pay | Admitting: Adult Health

## 2017-01-30 ENCOUNTER — Ambulatory Visit: Payer: 59 | Admitting: Physical Therapy

## 2017-01-30 DIAGNOSIS — M25511 Pain in right shoulder: Secondary | ICD-10-CM

## 2017-01-30 DIAGNOSIS — R600 Localized edema: Secondary | ICD-10-CM | POA: Diagnosis not present

## 2017-01-30 DIAGNOSIS — M6281 Muscle weakness (generalized): Secondary | ICD-10-CM | POA: Diagnosis not present

## 2017-01-30 DIAGNOSIS — M25611 Stiffness of right shoulder, not elsewhere classified: Secondary | ICD-10-CM | POA: Diagnosis not present

## 2017-01-30 NOTE — Telephone Encounter (Signed)
Pt seen by TP on 7.12.18 and CPAP pressure was decreased to 8cm, then e-mail from patient requesting pressure be increased up to 9cm - download requested  Download received and reviewed by TP:  Looks great, no leaks, AHI 0.6 is great.  No changes.  Called spoke with patient and discussed the above Pt voiced her understanding Download sent for scan Nothing further needed; will sign off

## 2017-01-30 NOTE — Therapy (Signed)
[facility] [address], [address] [place], [place], [address] Phone: [phone]   Fax:  [phone]  Physical Therapy Treatment  Patient Details  Name: [name] MRN: [id number] Date of Birth: 11-27-1970 Referring Provider: Dr. Malon Kindle  Encounter Date: 01/30/2017      PT End of Session - 01/30/17 1131    Visit Number 11   Date for PT Re-Evaluation 02/02/17   Authorization Type UMR   PT Start Time 1100   PT Stop Time 1148   PT Time Calculation (min) 48 min   Activity Tolerance Patient tolerated treatment well      Past Medical History:  Diagnosis Date  . Allergy   . Asthma    pt stated treated as ashtma but not really asthma  . Bipolar disorder (HCC)   . Chicken pox   . Complication of anesthesia    oxygen saturation dropped after hysterectomy 2009 at [facility]  . Depression   . Diabetes mellitus   . Dyspnea   . GERD (gastroesophageal reflux disease)   . Hyperlipidemia   . Hypertension    readings  . Hypothyroidism   . Irregular heartbeat    saw dr Myrtis Ser sees cardiology as needed  . Migraine   . Sleep apnea     Past Surgical History:  Procedure Laterality Date  . ANKLE SURGERY  1989   left  . BACK SURGERY  1999  . CESAREAN SECTION     1995  . LAPAROSCOPIC ASSISTED VAGINAL HYSTERECTOMY  2009     BSO fibroids, DUB, pelvic pain  . 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 SHOULDER ARTHROSCOPY WITH OPEN ROTATOR CUFF REPAIR Right 01/02/2017   Procedure: Right shoulder arthroscopy, A-subcromial decompression, mini open rotator cuff repair, open distal clavicle resection, biceps tenodesis;  Surgeon: Beverely Low, MD;  Location: [facility] OR;  Service: Orthopedics;  Laterality: Right;  . THYROIDECTOMY, PARTIAL  2001    removed left  . UMBILICAL HERNIA REPAIR  2003  . WISDOM TOOTH EXTRACTION      There were no vitals filed for this visit.      Subjective Assessment - 01/30/17 1101    Subjective Getting better.  Mild shoulder pain. Using pulleys at home often.     Pertinent History MD 9/18 sling til then    Currently in Pain? Yes   Pain Score 2    Pain Location Shoulder   Pain Orientation Right   Pain Type Surgical pain                         [facility] Adult PT Treatment/Exercise - 01/30/17 0001      Shoulder Exercises: Supine   Protraction AAROM;Right;10 reps   Flexion AAROM;Right;5 reps   Flexion Limitations with UE Ranger     Shoulder Exercises: Standing   Other Standing Exercises UE Ranger on 1st and 2nd step 10x each   Other Standing Exercises UE Ranger on wall below 0 10x, Level 1 10x     Shoulder Exercises: ROM/Strengthening   Other ROM/Strengthening Exercises discussion of palm stretch secondary to reports of cramps at home   Other ROM/Strengthening Exercises review table wrist flexion and extension curls     Shoulder Exercises: Isometric Strengthening   Flexion 5X5"   Extension 5X5"  External Rotation 5X5"   Internal Rotation 5X5"   ABduction 5X5"     Cryotherapy   Number Minutes Cryotherapy 10 Minutes   Cryotherapy Location Shoulder   Type of Cryotherapy Ice pack     Manual Therapy   Passive ROM pt reclined: gentle P/ROM into flexion and ER  Elbow flexion extension in supination                  PT Short Term Goals - 01/30/17 1157      PT SHORT TERM GOAL #1   Title independent with initial HEP with her husband helping   Status Achieved     PT SHORT TERM GOAL #2   Title understand how to ice the shoulder   Status Achieved     PT SHORT TERM GOAL #3   Title pain in right shoulder decreased >/= 25%   Status Achieved           PT Long Term Goals - 01/30/17 1158      PT LONG TERM GOAL #1   Title Independent with HEP and how to  progress herself   Time 4   Period Weeks   Status On-going     PT LONG TERM GOAL #2   Title right shoulder AROM is WFL so she is able to dress herself without difficulty   Time 4   Period Weeks   Status On-going     PT LONG TERM GOAL #3   Title right shoulder strength is >/= 4/5 so she is able to perform her work tasks without increase in pain   Time 4   Period Weeks   Status On-going     PT LONG TERM GOAL #4   Title right shoulder pain decreased >/= 75% due to improved mobility and reduction in edema   Time 4   Period Weeks   Status On-going     PT LONG TERM GOAL #5   Title able to lift light object like her purse due to right shoulder strength >/= 4/5 and no restrictions from the MD   Time 4   Period Weeks   Status On-going     PT LONG TERM GOAL #6   Title FOTO score </= 54% limitation   Time 4   Period Weeks   Status On-going               Plan - 01/30/17 1141    Clinical Impression Statement The patient is 4 weeks s/p rotator cuff repair and is on target for this length of time from surgery.  Patient states she is still sleeping in the recliner for comfort.  She is thinking about getting a wedge for the bed.  She needs mod to max assist to sit up from supine from the right side of the bed.  With the UE Ranger she is able to elevate her UE with minimal upper trap compensation.     Rehab Potential Excellent   Clinical Impairments Affecting Rehab Potential No shoulder abduction; AAROM to right shoulder gently, isometrics;  ERO next visit    PT Frequency 3x / week   PT Duration 4 weeks   PT Treatment/Interventions Cryotherapy;Electrical Stimulation;Moist Heat;Ultrasound;Patient/family education;Neuromuscular re-education;Therapeutic exercise;Therapeutic activities;Taping;Vasopneumatic Device;Passive range of motion;Scar mobilization;Manual techniques   PT Next Visit Plan Progress A/P/ PROM as tolerated, UE Ranger, pulleys; gentle shoulder Isometrics;  ERO next visit       Patient will benefit from skilled therapeutic intervention in order to improve the following deficits  and impairments:  Decreased activity tolerance, Decreased mobility, Decreased strength, Increased edema, Decreased scar mobility, Impaired flexibility, Decreased endurance, Decreased coordination, Decreased range of motion, Increased fascial restricitons, Pain  Visit Diagnosis: Acute pain of right shoulder  Muscle weakness (generalized)  Localized edema  Stiffness of right shoulder, not elsewhere classified     Problem List Patient Active Problem List   Diagnosis Date Noted  . Morbid obesity with body mass index (BMI) of 50.0 to 59.9 in adult [facility](HCC) 04/14/2016  . Type 2 diabetes mellitus without complications (HCC) 10/05/2015  . Hypersomnia with sleep apnea 06/20/2015  . Right lumbar radiculopathy 03/13/2014  . Occipital headache 01/03/2014  . 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 Benign paroxysmal positional vertigo 02/03/2013  . Ear pain 03/12/2012  . General medical examination 08/06/2011  . SINUS TACHYCARDIA 08/22/2008  . GERD 08/22/2008  . HYPERLIPIDEMIA, MIXED 05/25/2007  . OBESITY, MORBID 05/25/2007  . Bipolar disorder (HCC) 05/25/2007  . MIGRAINE, CHRONIC 05/25/2007  . Essential hypertension 05/25/2007  . Allergic rhinitis 05/25/2007  . Obstructive sleep apnea 05/17/2007   Lavinia SharpsStacy Odies Desa, PT 01/30/17 11:59 AM Phone: [phone] Fax: [phone]  Vivien PrestoSimpson, Braxten Memmer C 01/30/2017, 11:59 AM  [facility] [address] [place], [place], [address] Phone: [phone]   Fax:  [phone]  Name: [name] MRN: [id number] Date of Birth: Jun 24, 1970

## 2017-02-02 ENCOUNTER — Ambulatory Visit: Payer: 59

## 2017-02-02 DIAGNOSIS — M25611 Stiffness of right shoulder, not elsewhere classified: Secondary | ICD-10-CM | POA: Diagnosis not present

## 2017-02-02 DIAGNOSIS — M6281 Muscle weakness (generalized): Secondary | ICD-10-CM | POA: Diagnosis not present

## 2017-02-02 DIAGNOSIS — M25511 Pain in right shoulder: Secondary | ICD-10-CM

## 2017-02-02 DIAGNOSIS — R600 Localized edema: Secondary | ICD-10-CM | POA: Diagnosis not present

## 2017-02-02 NOTE — Therapy (Signed)
[facility] [address] [place], [place], [address] Phone: [phone]   Fax:  [phone]  Physical Therapy Treatment  Patient Details  Name: [name] MRN: [id number] Date of Birth: July 04, 1970 Referring Provider: Dr. Malon Kindle  Encounter Date: 02/02/2017      PT End of Session - 02/02/17 1527    Visit Number 12   Date for PT Re-Evaluation 03/30/17   PT Start Time 1446   PT Stop Time 1537   PT Time Calculation (min) 51 min   Activity Tolerance Patient tolerated treatment well   Behavior During Therapy [facility] for tasks assessed/performed      Past Medical History:  Diagnosis Date  . Allergy   . Asthma    pt stated treated as ashtma but not really asthma  . Bipolar disorder (HCC)   . Chicken pox   . Complication of anesthesia    oxygen saturation dropped after hysterectomy 2009 at [facility]  . Depression   . Diabetes mellitus   . Dyspnea   . GERD (gastroesophageal reflux disease)   . Hyperlipidemia   . Hypertension    readings  . Hypothyroidism   . Irregular heartbeat    saw dr Myrtis Ser sees cardiology as needed  . Migraine   . Sleep apnea     Past Surgical History:  Procedure Laterality Date  . ANKLE SURGERY  1989   left  . BACK SURGERY  1999  . CESAREAN SECTION     1995  . LAPAROSCOPIC ASSISTED VAGINAL HYSTERECTOMY  2009     BSO fibroids, DUB, pelvic pain  . 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 SHOULDER ARTHROSCOPY WITH OPEN ROTATOR CUFF REPAIR Right 01/02/2017   Procedure: Right shoulder arthroscopy, A-subcromial decompression, mini open rotator cuff repair, open distal clavicle resection, biceps tenodesis;  Surgeon: Beverely Low, MD;  Location: [facility] OR;  Service: Orthopedics;  Laterality: Right;   . THYROIDECTOMY, PARTIAL  2001   removed left  . UMBILICAL HERNIA REPAIR  2003  . WISDOM TOOTH EXTRACTION      There were no vitals filed for this visit.      Subjective Assessment - 02/02/17 1456    Subjective I'm doing OK with exercises.  I have pulleys at home.  Going to MD 02/10/17-wearing sling until this visit   Currently in Pain? Yes   Pain Score 2    Pain Location Shoulder   Pain Orientation Right   Pain Descriptors / Indicators Sore   Pain Type Surgical pain   Pain Onset More than a month ago   Pain Frequency Constant   Aggravating Factors  exercise, moving it   Pain Relieving Factors rest, pulleys, ice            [facility] PT Assessment - 02/02/17 0001      Assessment   Medical Diagnosis s/p Rt rotator cuff surgery     Precautions   Required Braces or Orthoses Sling     Observation/Other Assessments   Focus on Therapeutic Outcomes (FOTO)  68% limitation     PROM   Right Shoulder Flexion 115 Degrees   Right Shoulder External Rotation 17 Degrees   Right Elbow Extension 0     Palpation   Palpation comment well healing surgical incision, no edema  [facility] - 02/02/17 0001      Shoulder Exercises: Supine   Flexion AAROM;Right;5 reps   Flexion Limitations with UE Ranger     Shoulder Exercises: Seated   Other Seated Exercises scap squeezes 3 sec 10x     Shoulder Exercises: Standing   Flexion AAROM;Right;10 reps  2x10 to #13   Other Standing Exercises UE Ranger on 1st and 2nd step 10x each   Other Standing Exercises UE Ranger on wall below 0 10x, Level 1 10x     Shoulder Exercises: Pulleys   Flexion 3 minutes  2x3 minutes     Cryotherapy   Number Minutes Cryotherapy 10 Minutes   Cryotherapy Location Shoulder   Type of Cryotherapy Ice pack     Manual Therapy   Passive ROM pt reclined: gentle P/ROM into flexion and ER  Elbow flexion extension in supination                  PT  Short Term Goals - 02/02/17 1457      PT SHORT TERM GOAL #1   Title independent with initial HEP with her husband helping   Status Achieved     PT SHORT TERM GOAL #2   Title understand how to ice the shoulder   Status Achieved     PT SHORT TERM GOAL #3   Title pain in right shoulder decreased >/= 25%   Status Achieved           PT Long Term Goals - 02/02/17 1458      PT LONG TERM GOAL #1   Title Independent with HEP and how to progress herself   Time 8   Period Weeks   Status On-going     PT LONG TERM GOAL #2   Title right shoulder AROM is WFL so she is able to dress herself without difficulty   Baseline not cleared for A/ROM and requires assistance with dressing   Time 8   Period Weeks   Status On-going     PT LONG TERM GOAL #3   Title right shoulder strength is >/= 4/5 so she is able to perform her work tasks without increase in pain   Baseline not cleared for strengthening right now   Time 8   Period Weeks   Status On-going     PT LONG TERM GOAL #4   Title wean from sling and report 50% use of Rt UE as allowed by MD   Time 8   Period Weeks   Status On-going     PT LONG TERM GOAL #5   Title able to lift light object like her purse due to right shoulder strength >/= 4/5 and no restrictions from the MD   Baseline still with restriction from MD and not able to move Rt UE against gravity independently   Time 8   Period Weeks   Status On-going     PT LONG TERM GOAL #6   Title FOTO score </= 54% limitation   Baseline 68% limitation   Time 8   Period Weeks   Status On-going               Plan - 02/02/17 1524    Clinical Impression Statement Pt is limited by MD precautions at this time.  Wearing sling 100% and A/AROM in the clinic.  Pt with limited P/ROM on the Rt and demonstrates functional weakness expected with immobilization.  Pt will see MD to discuss the next  stages of rehab.  Pt is making steady progress and will continue to benefit from skilled  PT for safe progression of activity after rotator cuff repair.     Rehab Potential Excellent   Clinical Impairments Affecting Rehab Potential No shoulder abduction; AAROM to right shoulder gently, isometrics;  ERO next visit    PT Frequency 3x / week   PT Duration 8 weeks   PT Treatment/Interventions Cryotherapy;Electrical Stimulation;Moist Heat;Ultrasound;Patient/family education;Neuromuscular re-education;Therapeutic exercise;Therapeutic activities;Taping;Vasopneumatic Device;Passive range of motion;Scar mobilization;Manual techniques   PT Next Visit Plan Progress A/P/ PROM as tolerated, UE Ranger, pulleys; gentle shoulder Isometrics   Recommended Other Services MD signed initial certification   Consulted and Agree with Plan of Care Patient      Patient will benefit from skilled therapeutic intervention in order to improve the following deficits and impairments:  Decreased activity tolerance, Decreased mobility, Decreased strength, Increased edema, Decreased scar mobility, Impaired flexibility, Decreased endurance, Decreased coordination, Decreased range of motion, Increased fascial restricitons, Pain  Visit Diagnosis: Acute pain of right shoulder  Muscle weakness (generalized)  Stiffness of right shoulder, not elsewhere classified     Problem List Patient Active Problem List   Diagnosis Date Noted  . Morbid obesity with body mass index (BMI) of 50.0 to 59.9 in adult [facility]) 04/14/2016  . Type 2 diabetes mellitus without complications (HCC) 10/05/2015  . Hypersomnia with sleep apnea 06/20/2015  . Right lumbar radiculopathy 03/13/2014  . Occipital headache 01/03/2014  . 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 Benign paroxysmal positional vertigo 02/03/2013  . Ear pain 03/12/2012  . General medical examination 08/06/2011  . SINUS TACHYCARDIA 08/22/2008  . GERD 08/22/2008  . HYPERLIPIDEMIA, MIXED 05/25/2007  . OBESITY, MORBID 05/25/2007  . Bipolar disorder (HCC) 05/25/2007  .  MIGRAINE, CHRONIC 05/25/2007  . Essential hypertension 05/25/2007  . Allergic rhinitis 05/25/2007  . Obstructive sleep apnea 05/17/2007     Lorrene Reid, PT 02/02/17 3:29 PM  [facility] [address] [place], [place], [address] Phone: [phone]   Fax:  [phone]  Name: Bana Borgmeyer MRN: [id number] Date of Birth: [birth year]

## 2017-02-04 ENCOUNTER — Encounter: Payer: Self-pay | Admitting: Physical Therapy

## 2017-02-04 ENCOUNTER — Ambulatory Visit: Payer: 59 | Admitting: Physical Therapy

## 2017-02-04 DIAGNOSIS — R600 Localized edema: Secondary | ICD-10-CM | POA: Diagnosis not present

## 2017-02-04 DIAGNOSIS — M25511 Pain in right shoulder: Secondary | ICD-10-CM | POA: Diagnosis not present

## 2017-02-04 DIAGNOSIS — M25611 Stiffness of right shoulder, not elsewhere classified: Secondary | ICD-10-CM | POA: Diagnosis not present

## 2017-02-04 DIAGNOSIS — M6281 Muscle weakness (generalized): Secondary | ICD-10-CM
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
## 2017-02-06 ENCOUNTER — Ambulatory Visit: Payer: 59 | Admitting: Physical Therapy

## 2017-02-06 ENCOUNTER — Encounter: Payer: Self-pay | Admitting: Physical Therapy

## 2017-02-06 DIAGNOSIS — R600 Localized edema: Secondary | ICD-10-CM

## 2017-02-06 DIAGNOSIS — M25511 Pain in right shoulder: Secondary | ICD-10-CM | POA: Diagnosis not present

## 2017-02-06 DIAGNOSIS — M25611 Stiffness of right shoulder, not elsewhere classified: Secondary | ICD-10-CM | POA: Diagnosis not present

## 2017-02-06 DIAGNOSIS — M6281 Muscle weakness (generalized): Secondary | ICD-10-CM
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
## 2017-02-06 NOTE — Patient Instructions (Signed)
SHOULDER: External Rotation - Supine (Cane)    Hold cane with both hands. Rotate arm away from body. Keep elbow on floor and next to body. _10__ reps per set, __2_ sets per day.Make sure to rotate the thumb outward to do this ex.  Add towel to keep elbow at side.  Copyright  VHI. All rights reserved.

## 2017-02-09 ENCOUNTER — Ambulatory Visit: Payer: 59 | Admitting: Physical Therapy

## 2017-02-09 ENCOUNTER — Encounter: Payer: Self-pay | Admitting: Physical Therapy

## 2017-02-09 DIAGNOSIS — M25511 Pain in right shoulder: Secondary | ICD-10-CM

## 2017-02-09 DIAGNOSIS — M6281 Muscle weakness (generalized): Secondary | ICD-10-CM

## 2017-02-09 DIAGNOSIS — R600 Localized edema: Secondary | ICD-10-CM

## 2017-02-09 DIAGNOSIS — M25611 Stiffness of right shoulder, not elsewhere classified: Secondary | ICD-10-CM | POA: Diagnosis not present
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
## 2017-02-10 MED FILL — traMADol HCL 50 MG TABS: 50 | 5 days supply | Qty: 60 | Fill #0

## 2017-02-11 ENCOUNTER — Ambulatory Visit: Payer: Self-pay | Admitting: Registered"

## 2017-02-11 ENCOUNTER — Ambulatory Visit: Payer: 59 | Admitting: Physical Therapy

## 2017-02-11 ENCOUNTER — Encounter: Payer: Self-pay | Admitting: Physical Therapy

## 2017-02-11 DIAGNOSIS — M6281 Muscle weakness (generalized): Secondary | ICD-10-CM | POA: Diagnosis not present

## 2017-02-11 DIAGNOSIS — M25611 Stiffness of right shoulder, not elsewhere classified: Secondary | ICD-10-CM | POA: Diagnosis not present

## 2017-02-11 DIAGNOSIS — R600 Localized edema: Secondary | ICD-10-CM | POA: Diagnosis not present

## 2017-02-11 DIAGNOSIS — M25511 Pain in right shoulder: Secondary | ICD-10-CM | POA: Diagnosis not present

## 2017-02-11 NOTE — Patient Instructions (Signed)
   Shoulder isometric-external rotation   Hold your arm against your side, with your elbow at a 90 degree angle. Without moving your body, push the back of your hand into the wall. You should feel your shoulder muscles contract. Repeat contract and relax motion. Contract and hold 5 sec repeat 10x Do twice/day    Shoulder isometric-abduction   Hold your arm against your side, with your elbow at a 90 degree angle. Without moving your body, push your arm into the wall. You should feel your shoulder muscles contract. Repeat contract and relax motion. Contract and hold 5 sec repeat 10x, Do twice/day    Shoulder isometric-internal rotation   Hold your arm against your side, with your elbow at a 90 degree angle. Without actually moving your arm, push your hand into the wall. You should feel your shoulder muscles contract. Repeat contract and relax motion. Contract and hold 5 sec repeat 10x, Do twice/day    INTERNAL ROTATION TOWEL STRETCH - IR TOWEL  Gently pull up your affected arm behind your back with the assist of a towel Hold 10 sec, repeat for 3 minutes, do 2x/day

## 2017-02-11 NOTE — Therapy (Signed)
[facility] [address], [address] [place], [place], [address] Phone: [phone]   Fax:  [phone]  Physical Therapy Treatment  Patient Details  Name: [name] MRN: [id number] Date of Birth: 1970-09-15 Referring Provider: Dr. Malon Kindle  Encounter Date: 02/11/2017      PT End of Session - 02/11/17 1238    Visit Number 16   Date for PT Re-Evaluation 03/30/17   Authorization Type UMR   PT Start Time 1234   PT Stop Time 1328   PT Time Calculation (min) 54 min   Activity Tolerance Patient tolerated treatment well   Behavior During Therapy [facility] for tasks assessed/performed      Past Medical History:  Diagnosis Date  . Allergy   . Asthma    pt stated treated as ashtma but not really asthma  . Bipolar disorder (HCC)   . Chicken pox   . Complication of anesthesia    oxygen saturation dropped after hysterectomy 2009 at [facility]  . Depression   . Diabetes mellitus   . Dyspnea   . GERD (gastroesophageal reflux disease)   . Hyperlipidemia   . Hypertension    readings  . Hypothyroidism   . Irregular heartbeat    saw dr Myrtis Ser sees cardiology as needed  . Migraine   . Sleep apnea     Past Surgical History:  Procedure Laterality Date  . ANKLE SURGERY  1989   left  . BACK SURGERY  1999  . CESAREAN SECTION     1995  . LAPAROSCOPIC ASSISTED VAGINAL HYSTERECTOMY  2009     BSO fibroids, DUB, pelvic pain  . 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 SHOULDER ARTHROSCOPY WITH OPEN ROTATOR CUFF REPAIR Right 01/02/2017   Procedure: Right shoulder arthroscopy, A-subcromial decompression, mini open rotator cuff repair, open distal clavicle resection, biceps tenodesis;  Surgeon: Beverely Low, MD;  Location: [facility] OR;  Service:  Orthopedics;  Laterality: Right;  . THYROIDECTOMY, PARTIAL  2001   removed left  . UMBILICAL HERNIA REPAIR  2003  . WISDOM TOOTH EXTRACTION      There were no vitals filed for this visit.      Subjective Assessment - 02/11/17 1240    Subjective Pt went to MD and is limited with abduction (no abduction).  Able to start isometric strengthening today.   Pertinent History MD f/u 10/30                         [facility] Adult PT Treatment/Exercise - 02/11/17 0001      Shoulder Exercises: Supine   External Rotation AAROM;Right;20 reps   Flexion AAROM;Both;20 reps;Other (comment)     Shoulder Exercises: Standing   Flexion AAROM;Right;10 reps  2x10 with VC to relax the shoulder   Other Standing Exercises UE Ranger on #25 10x2 each  PTA with min Asst     Shoulder Exercises: Pulleys   Flexion 3 minutes  Then 10x with slow hold   Other Pulley Exercises walking hand up back x 3 min     Shoulder Exercises: Isometric Strengthening   Flexion 5X5"  2 sets standing   Extension 5X5"  2 sets standing   External Rotation 5X5"  2 sets standing   Internal Rotation 5X5"  2 sets standing  ABduction 5X5"  2 sets standing     Cryotherapy   Number Minutes Cryotherapy 10 Minutes   Cryotherapy Location Shoulder   Type of Cryotherapy Ice pack     Manual Therapy   Passive ROM pt reclined: gentle P/ROM into ER and flexion  Elbow flexion extension in supination                PT Education - 02/11/17 1326    Education provided Yes   Education Details isometric and IR towel stretch   Person(s) Educated Patient   Methods Explanation;Demonstration;Verbal cues;Handout   Comprehension Verbalized understanding;Returned demonstration          PT Short Term Goals - 02/02/17 1457      PT SHORT TERM GOAL #1   Title independent with initial HEP with her husband helping   Status Achieved     PT SHORT TERM GOAL #2   Title understand how to ice the shoulder   Status  Achieved     PT SHORT TERM GOAL #3   Title pain in right shoulder decreased >/= 25%   Status Achieved           PT Long Term Goals - 02/09/17 1538      PT LONG TERM GOAL #2   Title right shoulder AROM is WFL so she is able to dress herself without difficulty   Time 8   Period Weeks   Status On-going     PT LONG TERM GOAL #4   Title wean from sling and report 50% use of Rt UE as allowed by MD   Time 8   Period Weeks   Status On-going               Plan - 02/11/17 1254    Clinical Impression Statement Pt is able to go without sling, only wearing it in busy areas in public.  She is able to start strengthening and AAROM IR.  Able to start focusing on ADLs such as driving.  Pt did well with exercises and no increased pain.  Continue skilled PT to progress towards functional goals as allowed by protocol   Rehab Potential Excellent   Clinical Impairments Affecting Rehab Potential No shoulder abduction; AAROM to right shoulder gently, isometrics;    PT Treatment/Interventions Cryotherapy;Electrical Stimulation;Moist Heat;Ultrasound;Patient/family education;Neuromuscular re-education;Therapeutic exercise;Therapeutic activities;Taping;Vasopneumatic Device;Passive range of motion;Scar mobilization;Manual techniques   PT Next Visit Plan progress AROM, no abduction, gentle strengthening, work on ADLs such as driving   PT Home Exercise Plan progress as needed   Consulted and Agree with Plan of Care Patient      Patient will benefit from skilled therapeutic intervention in order to improve the following deficits and impairments:  Decreased activity tolerance, Decreased mobility, Decreased strength, Increased edema, Decreased scar mobility, Impaired flexibility, Decreased endurance, Decreased coordination, Decreased range of motion, Increased fascial restricitons, Pain  Visit Diagnosis: Acute pain of right shoulder  Muscle weakness (generalized)  Stiffness of right shoulder, not  elsewhere classified  Localized edema     Problem List Patient Active Problem List   Diagnosis Date Noted  . Morbid obesity with body mass index (BMI) of 50.0 to 59.9 in adult [facility]) 04/14/2016  . Type 2 diabetes mellitus without complications (HCC) 10/05/2015  . Hypersomnia with sleep apnea 06/20/2015  . Right lumbar radiculopathy 03/13/2014  . Occipital headache 01/03/2014  . 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 Benign paroxysmal positional vertigo 02/03/2013  . Ear pain 03/12/2012  . General medical examination 08/06/2011  .  SINUS TACHYCARDIA 08/22/2008  . GERD 08/22/2008  . HYPERLIPIDEMIA, MIXED 05/25/2007  . OBESITY, MORBID 05/25/2007  . Bipolar disorder (HCC) 05/25/2007  . MIGRAINE, CHRONIC 05/25/2007  . Essential hypertension 05/25/2007  . Allergic rhinitis 05/25/2007  . Obstructive sleep apnea 05/17/2007    Alphonsa Overall 02/11/2017, 1:36 PM  [facility] [address], [address] [place], [place], [address] Phone: [phone]   Fax:  [phone]  Name: Zoya Sprecher MRN: [id number] Date of Birth: 12-02-1970

## 2017-02-12 DIAGNOSIS — F3162 Bipolar disorder, current episode mixed, moderate: Secondary | ICD-10-CM | POA: Diagnosis not present

## 2017-02-13 ENCOUNTER — Encounter: Payer: Self-pay | Admitting: Physical Therapy

## 2017-02-16 ENCOUNTER — Ambulatory Visit: Payer: 59 | Admitting: Physical Therapy

## 2017-02-16 ENCOUNTER — Encounter: Payer: Self-pay | Admitting: Physical Therapy

## 2017-02-16 DIAGNOSIS — M25611 Stiffness of right shoulder, not elsewhere classified: Secondary | ICD-10-CM | POA: Diagnosis not present

## 2017-02-16 DIAGNOSIS — R600 Localized edema: Secondary | ICD-10-CM | POA: Diagnosis not present

## 2017-02-16 DIAGNOSIS — M6281 Muscle weakness (generalized): Secondary | ICD-10-CM | POA: Diagnosis not present

## 2017-02-16 DIAGNOSIS — M25511 Pain in right shoulder: Secondary | ICD-10-CM

## 2017-02-16 NOTE — Therapy (Signed)
[facility] [address], [address] [place], [place], [address] Phone: [phone]   Fax:  [phone]  Physical Therapy Treatment  Patient Details  Name: [name] MRN: [id number] Date of Birth: 11/06/1970 Referring Provider: Dr. Steven Norris  Encounter Date: 02/16/2017      PT End of Session - 02/16/17 0830    Visit Number 17   Date for PT Re-Evaluation 03/30/17   Authorization Type UMR   PT Start Time 0830   PT Stop Time 0932   PT Time Calculation (min) 62 min   Activity Tolerance Patient tolerated treatment well   Behavior During Therapy WFL for tasks assessed/performed      Past Medical History:  Diagnosis Date  . Allergy   . Asthma    pt stated treated as ashtma but not really asthma  . Bipolar disorder (HCC)   . Chicken pox   . Complication of anesthesia    oxygen saturation dropped after hysterectomy 2009 at [facility]  . Depression   . Diabetes mellitus   . Dyspnea   . GERD (gastroesophageal reflux disease)   . Hyperlipidemia   . Hypertension    readings  . Hypothyroidism   . Irregular heartbeat    saw dr Katz sees cardiology as needed  . Migraine   . Sleep apnea     Past Surgical History:  Procedure Laterality Date  . ANKLE SURGERY  1989   left  . BACK SURGERY  1999  . CESAREAN SECTION     1995  . LAPAROSCOPIC ASSISTED VAGINAL HYSTERECTOMY  2009     BSO fibroids, DUB, pelvic pain  . 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 REPAIR N/A 04/14/2016   Procedure: LAPAROSCOPIC ROUX-EN-Y GASTRIC BYPASS  WITH UPPER ENDOSCOPY;  Surgeon: Benjamin Hoxworth, MD;  Location: [facility];  Service: General;  Laterality: N/A;  . SHOULDER ARTHROSCOPY WITH OPEN ROTATOR CUFF REPAIR Right 01/02/2017   Procedure: Right shoulder arthroscopy, A-subcromial decompression, mini open rotator cuff repair, open distal clavicle resection, biceps tenodesis;  Surgeon: Norris, Steve, MD;  Location: [facility];  Service:  Orthopedics;  Laterality: Right;  . THYROIDECTOMY, PARTIAL  2001   removed left  . UMBILICAL HERNIA REPAIR  2003  . WISDOM TOOTH EXTRACTION      There were no vitals filed for this visit.      Subjective Assessment - 02/16/17 0905    Subjective A littel sore this AM, had a very good day yesterday. Not wearing sling during the day unless she goes to a busy store or walking into work.    Currently in Pain? Yes   Pain Score 2    Pain Location Shoulder   Pain Orientation Right   Aggravating Factors  Weather   Pain Relieving Factors heat,ice, stretching   Multiple Pain Sites No                         [facility] Adult PT Treatment/Exercise - 02/16/17 0001      Shoulder Exercises: Supine   Flexion AAROM;Strengthening;Both;20 reps   Theraband Level (Shoulder Flexion) --  with cane and PTA assist     Shoulder Exercises: Seated   Other Seated Exercises UE ranger forward/back 20x, ER 20x     Shoulder Exercises: Standing   Flexion --  Finger ladder 10x flexion/10x scaption ( min) reaching 20    Row Strengthening;Both;10 reps;Theraband   Theraband Level (Shoulder Row) Level 2 (Red)   Other Standing Exercises   UE Ranger on #31 10x2 each  PTA with min Asst     Shoulder Exercises: Pulleys   Flexion 3 minutes  Then 10x with slow hold     Moist Heat Therapy   Number Minutes Moist Heat 12 Minutes   Moist Heat Location Shoulder  post session     Manual Therapy   Passive ROM ER > flexion, some behind the back in sidelying with gentlle soft tissue work medial scap border                  PT Short Term Goals - 02/02/17 1457      PT SHORT TERM GOAL #1   Title independent with initial HEP with her husband helping   Status Achieved     PT SHORT TERM GOAL #2   Title understand how to ice the shoulder   Status Achieved     PT SHORT TERM GOAL #3   Title pain in right shoulder decreased >/= 25%   Status Achieved           PT Long Term Goals - 02/16/17  0907      PT LONG TERM GOAL #2   Title right shoulder AROM is WFL so she is able to dress herself without difficulty   Time 8   Period Weeks   Status On-going  Husband still helps with shirt and shoes     PT LONG TERM GOAL #4   Title wean from sling and report 50% use of Rt UE as allowed by MD   Time 8   Period Weeks   Status Partially Met               Plan - 02/16/17 0923    Clinical Impression Statement Pt has weaned out of sling 100% except walking into work. She is making progress reaching behind her back but this direction tends to be the tightest , Today we got her to her sacrum. She is working 4 hrs a day and doing well with this. In supine pt still needs min asst from PTA to pereform forward flexion with arm at 90 degrees flexion.    Rehab Potential Excellent   Clinical Impairments Affecting Rehab Potential No shoulder abduction; AAROM to right shoulder gently, isometrics;    PT Frequency 3x / week   PT Duration 8 weeks   PT Treatment/Interventions Cryotherapy;Electrical Stimulation;Moist Heat;Ultrasound;Patient/family education;Neuromuscular re-education;Therapeutic exercise;Therapeutic activities;Taping;Vasopneumatic Device;Passive range of motion;Scar mobilization;Manual techniques   PT Next Visit Plan progress AROM, no abduction, gentle strengthening, work on ADLs such as driving      Patient will benefit from skilled therapeutic intervention in order to improve the following deficits and impairments:  Decreased activity tolerance, Decreased mobility, Decreased strength, Increased edema, Decreased scar mobility, Impaired flexibility, Decreased endurance, Decreased coordination, Decreased range of motion, Increased fascial restricitons, Pain  Visit Diagnosis: Acute pain of right shoulder  Muscle weakness (generalized)  Stiffness of right shoulder, not elsewhere classified  Localized edema     Problem List Patient Active Problem List   Diagnosis Date  Noted  . Morbid obesity with body mass index (BMI) of 50.0 to 59.9 in adult (HCC) 04/14/2016  . Type 2 diabetes mellitus without complications (HCC) 10/05/2015  . Hypersomnia with sleep apnea 06/20/2015  . Right lumbar radiculopathy 03/13/2014  . Occipital headache 01/03/2014  . 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 Benign paroxysmal positional vertigo 02/03/2013  . Ear pain 03/12/2012  . General medical examination 08/06/2011  . SINUS TACHYCARDIA 08/22/2008  .   GERD 08/22/2008  . HYPERLIPIDEMIA, MIXED 05/25/2007  . OBESITY, MORBID 05/25/2007  . Bipolar disorder (HCC) 05/25/2007  . MIGRAINE, CHRONIC 05/25/2007  . Essential hypertension 05/25/2007  . Allergic rhinitis 05/25/2007  . Obstructive sleep apnea 05/17/2007    ,, PTA 02/16/2017, 9:28 AM  [facility] [address], [address] [place], [place], [address] Phone: [phone]   Fax:  [phone]  Name: [name] MRN: [id number] Date of Birth: 05/04/1971   

## 2017-02-17 ENCOUNTER — Other Ambulatory Visit: Payer: Self-pay | Admitting: Family Medicine

## 2017-02-17 MED FILL — raNITIdine HCL 300 MG TABS: 300 | 90 days supply | Qty: 90 | Fill #0

## 2017-02-18 ENCOUNTER — Ambulatory Visit: Payer: 59 | Admitting: Physical Therapy

## 2017-02-18 DIAGNOSIS — M6281 Muscle weakness (generalized): Secondary | ICD-10-CM

## 2017-02-18 DIAGNOSIS — R600 Localized edema: Secondary | ICD-10-CM | POA: Diagnosis not present

## 2017-02-18 DIAGNOSIS — M25511 Pain in right shoulder: Secondary | ICD-10-CM | POA: Diagnosis not present

## 2017-02-18 DIAGNOSIS — M25611 Stiffness of right shoulder, not elsewhere classified: Secondary | ICD-10-CM

## 2017-02-18 NOTE — Therapy (Signed)
[facility] [address], [place], [place], [address] Phone: [phone]   Fax:  [phone]  Physical Therapy Treatment  Patient Details  Name: [name] MRN: [id number] Date of Birth: 11-Nov-1970 Referring Provider: Dr. Esmond Plants  Encounter Date: 02/18/2017      PT End of Session - 02/18/17 1055    Visit Number 18   Date for PT Re-Evaluation 03/30/17   Authorization Type UMR   PT Start Time 1014   PT Stop Time 1101   PT Time Calculation (min) 47 min   Activity Tolerance Patient tolerated treatment well   Behavior During Therapy [facility] for tasks assessed/performed      Past Medical History:  Diagnosis Date  . Allergy   . Asthma    pt stated treated as ashtma but not really asthma  . Bipolar disorder ([place])   . Chicken pox   . Complication of anesthesia    oxygen saturation dropped after hysterectomy 2009 at [facility]  . Depression   . Diabetes mellitus   . Dyspnea   . GERD (gastroesophageal reflux disease)   . Hyperlipidemia   . Hypertension    readings  . Hypothyroidism   . Irregular heartbeat    saw dr Ron Parker sees cardiology as needed  . Migraine   . Sleep apnea     Past Surgical History:  Procedure Laterality Date  . [place]   left  . BACK SURGERY  1999  . CESAREAN SECTION     1995  . LAPAROSCOPIC ASSISTED VAGINAL HYSTERECTOMY  2009     BSO fibroids, DUB, pelvic pain  . 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 REPAIR N/A 04/14/2016   Procedure: LAPAROSCOPIC ROUX-EN-Y GASTRIC BYPASS  WITH UPPER ENDOSCOPY;  Surgeon: Excell Seltzer, MD;  Location: [facility];  Service: General;  Laterality: N/A;  . SHOULDER ARTHROSCOPY WITH OPEN ROTATOR CUFF REPAIR Right 01/02/2017   Procedure: Right shoulder arthroscopy, A-subcromial decompression, mini open rotator cuff repair, open distal clavicle resection, biceps tenodesis;  Surgeon: Netta Cedars, MD;  Location: [facility];  Service:  Orthopedics;  Laterality: Right;  . THYROIDECTOMY, PARTIAL  2001   removed left  . UMBILICAL HERNIA REPAIR  2003  . WISDOM TOOTH EXTRACTION      There were no vitals filed for this visit.      Subjective Assessment - 02/18/17 1017    Subjective I have been working on hand strength at home.     Currently in Pain? Yes   Pain Score 1    Pain Location Shoulder   Pain Descriptors / Indicators Dull   Pain Type Surgical pain   Pain Onset More than a month ago   Pain Frequency Constant   Aggravating Factors  getting used to being out of the sling all day, more sore at end of the day   Pain Relieving Factors heat, ice, stretching   Multiple Pain Sites No                         [facility] Adult PT Treatment/Exercise - 02/18/17 0001      Shoulder Exercises: Seated   Other Seated Exercises UE ranger forward/back 20x, ER 20x     Shoulder Exercises: Standing   Flexion AAROM;Right  Finger ladder 10x flexion/10x scaption ( min) reaching 20    Extension Strengthening;Both;15 reps;Theraband  tricep extension   Theraband Level (Shoulder Extension) Level 1 (Yellow)   Row Strengthening;Both;Theraband;20 reps  Theraband Level (Shoulder Row) Level 2 (Red)   Other Standing Exercises UE Ranger on #31 10x2 each     Shoulder Exercises: Pulleys   Flexion 3 minutes  Then 10x with slow hold     Moist Heat Therapy   Number Minutes Moist Heat 8 Minutes   Moist Heat Location Shoulder  post session     Manual Therapy   Passive ROM shoulder ER and flexion                  PT Short Term Goals - 02/02/17 1457      PT SHORT TERM GOAL #1   Title independent with initial HEP with her husband helping   Status Achieved     PT SHORT TERM GOAL #2   Title understand how to ice the shoulder   Status Achieved     PT SHORT TERM GOAL #3   Title pain in right shoulder decreased >/= 25%   Status Achieved           PT Long Term Goals - 02/16/17 0907      PT LONG TERM  GOAL #2   Title right shoulder AROM is WFL so she is able to dress herself without difficulty   Time 8   Period Weeks   Status On-going  Husband still helps with shirt and shoes     PT LONG TERM GOAL #4   Title wean from sling and report 50% use of Rt UE as allowed by MD   Time 8   Period Weeks   Status Partially Met               Plan - 02/18/17 1056    Clinical Impression Statement Patient is doing well and tolerates band work for scapula strengthening.  She will continue to benefit from skilled PT for continued progression of strength and ROM per protocol   Clinical Impairments Affecting Rehab Potential No shoulder abduction; AAROM to right shoulder gently, isometrics;    PT Treatment/Interventions Cryotherapy;Electrical Stimulation;Moist Heat;Ultrasound;Patient/family education;Neuromuscular re-education;Therapeutic exercise;Therapeutic activities;Taping;Vasopneumatic Device;Passive range of motion;Scar mobilization;Manual techniques   PT Next Visit Plan progress AROM, no abduction, gentle strengthening, work on ADLs such as driving   Consulted and Agree with Plan of Care Patient      Patient will benefit from skilled therapeutic intervention in order to improve the following deficits and impairments:  Decreased activity tolerance, Decreased mobility, Decreased strength, Increased edema, Decreased scar mobility, Impaired flexibility, Decreased endurance, Decreased coordination, Decreased range of motion, Increased fascial restricitons, Pain  Visit Diagnosis: Acute pain of right shoulder  Muscle weakness (generalized)  Stiffness of right shoulder, not elsewhere classified  Localized edema     Problem List Patient Active Problem List   Diagnosis Date Noted  . Morbid obesity with body mass index (BMI) of 50.0 to 59.9 in adult [facility]) 04/14/2016  . Type 2 diabetes mellitus without complications ([place]) 68/04/7516  . Hypersomnia with sleep apnea 06/20/2015  . Right  lumbar radiculopathy 03/13/2014  . Occipital headache 01/03/2014  . 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 Benign paroxysmal positional vertigo 02/03/2013  . Ear pain 03/12/2012  . General medical examination 08/06/2011  . SINUS TACHYCARDIA 08/22/2008  . GERD 08/22/2008  . HYPERLIPIDEMIA, MIXED 05/25/2007  . OBESITY, MORBID 05/25/2007  . Bipolar disorder ([place]) 05/25/2007  . MIGRAINE, CHRONIC 05/25/2007  . Essential hypertension 05/25/2007  . Allergic rhinitis 05/25/2007  . Obstructive sleep apnea 05/17/2007    Zannie Cove, PT 02/18/2017, 10:59 AM  [facility] [address] [place], [place], [address] Phone: [phone]   Fax:  [phone]  Name: [name] MRN: [id number] Date of Birth: Jun 07, 1970

## 2017-02-19 MED FILL — lamoTRIgine 200 MG TABS: 200 | 90 days supply | Qty: 180 | Fill #0

## 2017-02-20 ENCOUNTER — Encounter: Payer: Self-pay | Admitting: Physical Therapy

## 2017-02-20 ENCOUNTER — Ambulatory Visit: Payer: 59 | Admitting: Physical Therapy

## 2017-02-20 DIAGNOSIS — R600 Localized edema: Secondary | ICD-10-CM

## 2017-02-20 DIAGNOSIS — M25511 Pain in right shoulder: Secondary | ICD-10-CM

## 2017-02-20 DIAGNOSIS — M25611 Stiffness of right shoulder, not elsewhere classified: Secondary | ICD-10-CM

## 2017-02-20 DIAGNOSIS — M6281 Muscle weakness (generalized): Secondary | ICD-10-CM | POA: Diagnosis not present

## 2017-02-20 NOTE — Therapy (Signed)
[facility] [address], [place], [place], [address] Phone: [phone]   Fax:  [phone]  Physical Therapy Treatment  Patient Details  Name: [name] MRN: [id number] Date of Birth: 13-Dec-1970 Referring Provider: Dr. Esmond Plants  Encounter Date: 02/20/2017      PT End of Session - 02/20/17 1059    Visit Number 19   Date for PT Re-Evaluation 03/30/17   Authorization Type UMR   PT Start Time 1059   PT Stop Time 1155   PT Time Calculation (min) 56 min   Activity Tolerance Patient tolerated treatment well   Behavior During Therapy [facility] for tasks assessed/performed      Past Medical History:  Diagnosis Date  . Allergy   . Asthma    pt stated treated as ashtma but not really asthma  . Bipolar disorder ([place])   . Chicken pox   . Complication of anesthesia    oxygen saturation dropped after hysterectomy 2009 at [facility]  . Depression   . Diabetes mellitus   . Dyspnea   . GERD (gastroesophageal reflux disease)   . Hyperlipidemia   . Hypertension    readings  . Hypothyroidism   . Irregular heartbeat    saw dr Ron Parker sees cardiology as needed  . Migraine   . Sleep apnea     Past Surgical History:  Procedure Laterality Date  . [place]   left  . BACK SURGERY  1999  . CESAREAN SECTION     1995  . LAPAROSCOPIC ASSISTED VAGINAL HYSTERECTOMY  2009     BSO fibroids, DUB, pelvic pain  . 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 REPAIR N/A 04/14/2016   Procedure: LAPAROSCOPIC ROUX-EN-Y GASTRIC BYPASS  WITH UPPER ENDOSCOPY;  Surgeon: Excell Seltzer, MD;  Location: [facility];  Service: General;  Laterality: N/A;  . SHOULDER ARTHROSCOPY WITH OPEN ROTATOR CUFF REPAIR Right 01/02/2017   Procedure: Right shoulder arthroscopy, A-subcromial decompression, mini open rotator cuff repair, open distal clavicle resection, biceps tenodesis;  Surgeon: Netta Cedars, MD;  Location: [place];  Service:  Orthopedics;  Laterality: Right;  . THYROIDECTOMY, PARTIAL  2001   removed left  . UMBILICAL HERNIA REPAIR  2003  . WISDOM TOOTH EXTRACTION      There were no vitals filed for this visit.      Subjective Assessment - 02/20/17 1100    Subjective Work has been good, just a little stiff from the weather.    Currently in Pain? Yes  More stiffness than pain   Pain Score 2    Pain Location Shoulder   Pain Orientation Right   Pain Descriptors / Indicators Dull   Multiple Pain Sites No                         [facility] Adult PT Treatment/Exercise - 02/20/17 0001      Shoulder Exercises: Supine   Flexion --  Single arm with elbow bent 2x10, then 1# added to cane 20x   Theraband Level (Shoulder Flexion) --  over head stretch with cane 20x     Shoulder Exercises: Sidelying   External Rotation AROM;Strengthening;Right;20 reps  PTA with moderate asst 2x10     Shoulder Exercises: Standing   Flexion AAROM;Right  Finger ladder 10x flexion/10x scaption ( min) reaching 20    Extension Strengthening;Both;15 reps;Theraband  tricep extension   Theraband Level (Shoulder Extension) Level 1 (Yellow)   Row Strengthening;Both;Theraband;20 reps  Theraband Level (Shoulder Row) Level 2 (Red)   Other Standing Exercises UE Ranger on #31 10x2 each     Shoulder Exercises: Pulleys   Flexion 3 minutes   Other Pulley Exercises Standing IR behind theback   3x10 with PTA assit     Shoulder Exercises: ROM/Strengthening   UBE (Upper Arm Bike) L0 x 2 min     Moist Heat Therapy   Number Minutes Moist Heat 15 Minutes   Moist Heat Location Shoulder     Manual Therapy   Passive ROM External rotation 3x10                  PT Short Term Goals - 02/02/17 1457      PT SHORT TERM GOAL #1   Title independent with initial HEP with her husband helping   Status Achieved     PT SHORT TERM GOAL #2   Title understand how to ice the shoulder   Status Achieved     PT SHORT TERM  GOAL #3   Title pain in right shoulder decreased >/= 25%   Status Achieved           PT Long Term Goals - 02/16/17 0907      PT LONG TERM GOAL #2   Title right shoulder AROM is WFL so she is able to dress herself without difficulty   Time 8   Period Weeks   Status On-going  Husband still helps with shirt and shoes     PT LONG TERM GOAL #4   Title wean from sling and report 50% use of Rt UE as allowed by MD   Time 8   Period Weeks   Status Partially Met               Plan - 02/20/17 1059    Clinical Impression Statement Pt had some initial stiffness coming into PT session today. This was not limiting as pt was able to demo increased flexion on the UE ranger with less PTA assistance. Sidelying ER was introduced today and required moderate assit to do. second set was better as pt was able to faciliate the proper muscles better.  Pt was also able to drive herself to PT for the first time today.    Rehab Potential Excellent   Clinical Impairments Affecting Rehab Potential No shoulder abduction; AAROM to right shoulder gently, isometrics;    PT Frequency 3x / week   PT Duration 8 weeks   PT Treatment/Interventions Cryotherapy;Electrical Stimulation;Moist Heat;Ultrasound;Patient/family education;Neuromuscular re-education;Therapeutic exercise;Therapeutic activities;Taping;Vasopneumatic Device;Passive range of motion;Scar mobilization;Manual techniques   PT Next Visit Plan progress AROM, no abduction, gentle strengthening.   Consulted and Agree with Plan of Care Patient      Patient will benefit from skilled therapeutic intervention in order to improve the following deficits and impairments:  Decreased activity tolerance, Decreased mobility, Decreased strength, Increased edema, Decreased scar mobility, Impaired flexibility, Decreased endurance, Decreased coordination, Decreased range of motion, Increased fascial restricitons, Pain  Visit Diagnosis: Acute pain of right  shoulder  Muscle weakness (generalized)  Stiffness of right shoulder, not elsewhere classified  Localized edema     Problem List Patient Active Problem List   Diagnosis Date Noted  . Morbid obesity with body mass index (BMI) of 50.0 to 59.9 in adult [facility]) 04/14/2016  . Type 2 diabetes mellitus without complications ([place]) 42/35/3614  . Hypersomnia with sleep apnea 06/20/2015  . Right lumbar radiculopathy 03/13/2014  . Occipital headache 01/03/2014  . Lichen simplex chronicus  09/21/2013  . Benign paroxysmal positional vertigo 02/03/2013  . Ear pain 03/12/2012  . General medical examination 08/06/2011  . SINUS TACHYCARDIA 08/22/2008  . GERD 08/22/2008  . HYPERLIPIDEMIA, MIXED 05/25/2007  . OBESITY, MORBID 05/25/2007  . Bipolar disorder ([place]) 05/25/2007  . MIGRAINE, CHRONIC 05/25/2007  . Essential hypertension 05/25/2007  . Allergic rhinitis 05/25/2007  . Obstructive sleep apnea 05/17/2007    Chauntelle Azpeitia, PTA 02/20/2017, 11:42 AM  [facility] [address] [place], [place], [address] Phone: [phone]   Fax:  [phone]  Name: [name] MRN: [id number] Date of Birth: 06/18/1970

## 2017-02-23 ENCOUNTER — Ambulatory Visit: Payer: 59 | Attending: Orthopedic Surgery | Admitting: Physical Therapy

## 2017-02-23 ENCOUNTER — Encounter: Payer: Self-pay | Admitting: Physical Therapy

## 2017-02-23 DIAGNOSIS — M25511 Pain in right shoulder: Secondary | ICD-10-CM | POA: Insufficient documentation

## 2017-02-23 DIAGNOSIS — M25611 Stiffness of right shoulder, not elsewhere classified: Secondary | ICD-10-CM | POA: Insufficient documentation

## 2017-02-23 DIAGNOSIS — R600 Localized edema: Secondary | ICD-10-CM | POA: Diagnosis not present

## 2017-02-23 DIAGNOSIS — M6281 Muscle weakness (generalized): Secondary | ICD-10-CM | POA: Insufficient documentation

## 2017-02-23 NOTE — Therapy (Signed)
[facility] [address], [place], [place], [address] Phone: [phone]   Fax:  [phone]  Physical Therapy Treatment  Patient Details  Name: [name] MRN: [id number] Date of Birth: 1971/03/09 Referring Provider: Dr. Esmond Plants  Encounter Date: 02/23/2017      PT End of Session - 02/23/17 0933    Visit Number 20   Date for PT Re-Evaluation 03/30/17   Authorization Type UMR   PT Start Time 0930   PT Stop Time 1025   PT Time Calculation (min) 55 min   Activity Tolerance Patient tolerated treatment well   Behavior During Therapy [facility] for tasks assessed/performed      Past Medical History:  Diagnosis Date  . Allergy   . Asthma    pt stated treated as ashtma but not really asthma  . Bipolar disorder ([place])   . Chicken pox   . Complication of anesthesia    oxygen saturation dropped after hysterectomy 2009 at [facility]  . Depression   . Diabetes mellitus   . Dyspnea   . GERD (gastroesophageal reflux disease)   . Hyperlipidemia   . Hypertension    readings  . Hypothyroidism   . Irregular heartbeat    saw dr Ron Parker sees cardiology as needed  . Migraine   . Sleep apnea     Past Surgical History:  Procedure Laterality Date  . [place]   left  . BACK SURGERY  1999  . CESAREAN SECTION     1995  . LAPAROSCOPIC ASSISTED VAGINAL HYSTERECTOMY  2009     BSO fibroids, DUB, pelvic pain  . 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 REPAIR N/A 04/14/2016   Procedure: LAPAROSCOPIC ROUX-EN-Y GASTRIC BYPASS  WITH UPPER ENDOSCOPY;  Surgeon: Excell Seltzer, MD;  [facility];  Service: General;  Laterality: N/A;  . SHOULDER ARTHROSCOPY WITH OPEN ROTATOR CUFF REPAIR Right 01/02/2017   Procedure: Right shoulder arthroscopy, A-subcromial decompression, mini open rotator cuff repair, open distal clavicle resection, biceps tenodesis;  Surgeon: Netta Cedars, MD;  Location: [place];  Service:  Orthopedics;  Laterality: Right;  . THYROIDECTOMY, PARTIAL  2001   removed left  . UMBILICAL HERNIA REPAIR  2003  . WISDOM TOOTH EXTRACTION      There were no vitals filed for this visit.      Subjective Assessment - 02/23/17 0935    Subjective Just a little stiff, I slept in the bed for 4 hrs last night. I was able to use my pulleys for behind the back .   Currently in Pain? Yes   Pain Score 2    Pain Location Shoulder   Pain Orientation Right   Pain Descriptors / Indicators Tightness   Aggravating Factors  Sleeping in the bed   Pain Relieving Factors Heat, ice   Multiple Pain Sites No                         [facility] Adult PT Treatment/Exercise - 02/23/17 0001      Shoulder Exercises: Supine   Flexion --  Single arm with elbow bent 2x10, then 1# added to cane 20x   Theraband Level (Shoulder Flexion) --  over head stretch with cane 20x     Shoulder Exercises: Sidelying   External Rotation AROM;Strengthening;Right;20 reps  PTA with moderate asst 2x10   Theraband Level (Shoulder External Rotation) --  3x10     Shoulder Exercises: Standing   Flexion  AAROM;Right  Finger ladder 10x flexion/10x scaption ( min) reaching 20    Extension Strengthening;Right;20 reps;Theraband   Theraband Level (Shoulder Extension) Level 1 (Yellow)  2x10   Row Strengthening;Both;Theraband   Theraband Level (Shoulder Row) Level 2 (Red)  2x 10   Other Standing Exercises UE Ranger on #35 10x2 each  PTA min asst up, min-mod down   Other Standing Exercises pendulums in between exs     Shoulder Exercises: Pulleys   Flexion 3 minutes   Other Pulley Exercises Standing IR behind theback   3x10 with PTA assit     Shoulder Exercises: ROM/Strengthening   UBE (Upper Arm Bike) L0 3x3     Cryotherapy   Number Minutes Cryotherapy 10 Minutes   Cryotherapy Location Shoulder   Type of Cryotherapy Ice pack                PT Education - 02/23/17 1004    Education provided Yes    Education Details Seated ER with arm by her side for HEP   Person(s) Educated Patient   Methods Explanation;Demonstration;Tactile cues;Verbal cues   Comprehension Returned demonstration;Verbalized understanding          PT Short Term Goals - 02/02/17 1457      PT SHORT TERM GOAL #1   Title independent with initial HEP with her husband helping   Status Achieved     PT SHORT TERM GOAL #2   Title understand how to ice the shoulder   Status Achieved     PT SHORT TERM GOAL #3   Title pain in right shoulder decreased >/= 25%   Status Achieved           PT Long Term Goals - 02/16/17 0907      PT LONG TERM GOAL #2   Title right shoulder AROM is WFL so she is able to dress herself without difficulty   Time 8   Period Weeks   Status On-going  Husband still helps with shirt and shoes     PT LONG TERM GOAL #4   Title wean from sling and report 50% use of Rt UE as allowed by MD   Time 8   Period Weeks   Status Partially Met               Plan - 02/23/17 0933    Clinical Impression Statement Pt reaching consistently to her sacrum and lower lumbar now. She will continue to work on this at home as well as in PT. Pt was able to initiate the sidelyinh movement of ER better today, still very limited by weakness and decreased AROM.    Clinical Impairments Affecting Rehab Potential No shoulder abduction; AAROM to right shoulder gently, isometrics;    PT Frequency 3x / week   PT Duration 8 weeks   PT Next Visit Plan progress AROM, no abduction, gentle strengthening.   PT Home Exercise Plan progress as needed   Consulted and Agree with Plan of Care Patient      Patient will benefit from skilled therapeutic intervention in order to improve the following deficits and impairments:  Decreased activity tolerance, Decreased mobility, Decreased strength, Increased edema, Decreased scar mobility, Impaired flexibility, Decreased endurance, Decreased coordination, Decreased range of  motion, Increased fascial restricitons, Pain  Visit Diagnosis: Acute pain of right shoulder  Muscle weakness (generalized)  Stiffness of right shoulder, not elsewhere classified  Localized edema     Problem List Patient Active Problem List   Diagnosis Date Noted  .  Morbid obesity with body mass index (BMI) of 50.0 to 59.9 in adult [facility]) 04/14/2016  . Type 2 diabetes mellitus without complications ([place]) 12/14/8286  . Hypersomnia with sleep apnea 06/20/2015  . Right lumbar radiculopathy 03/13/2014  . Occipital headache 01/03/2014  . 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 Benign paroxysmal positional vertigo 02/03/2013  . Ear pain 03/12/2012  . General medical examination 08/06/2011  . SINUS TACHYCARDIA 08/22/2008  . GERD 08/22/2008  . HYPERLIPIDEMIA, MIXED 05/25/2007  . OBESITY, MORBID 05/25/2007  . Bipolar disorder ([place]) 05/25/2007  . MIGRAINE, CHRONIC 05/25/2007  . Essential hypertension 05/25/2007  . Allergic rhinitis 05/25/2007  . Obstructive sleep apnea 05/17/2007    Keilani Terrance, PTA 02/23/2017, 10:07 AM  [facility] [address], [place], [place], [address] Phone: [phone]   Fax:  [phone]  Name: [name] MRN: [id number] Date of Birth: 1971/02/07

## 2017-02-25 ENCOUNTER — Encounter: Payer: Self-pay | Admitting: Physical Therapy

## 2017-02-25 ENCOUNTER — Ambulatory Visit: Payer: 59 | Admitting: Physical Therapy

## 2017-02-25 DIAGNOSIS — R600 Localized edema: Secondary | ICD-10-CM

## 2017-02-25 DIAGNOSIS — M25511 Pain in right shoulder: Secondary | ICD-10-CM | POA: Diagnosis not present

## 2017-02-25 DIAGNOSIS — M6281 Muscle weakness (generalized): Secondary | ICD-10-CM

## 2017-02-25 DIAGNOSIS — M25611 Stiffness of right shoulder, not elsewhere classified: Secondary | ICD-10-CM

## 2017-02-25 NOTE — Therapy (Signed)
[facility] [address], [place], [place], [address] Phone: [phone]   Fax:  [phone]  Physical Therapy Treatment  Patient Details  Name: [name] MRN: [id number] Date of Birth: 1971-03-21 Referring Provider: Dr. Esmond Plants  Encounter Date: 02/25/2017      PT End of Session - 02/25/17 0929    Visit Number 21   Date for PT Re-Evaluation 03/30/17   Authorization Type UMR   PT Start Time 0929   PT Stop Time 1017   PT Time Calculation (min) 48 min   Activity Tolerance Patient tolerated treatment well   Behavior During Therapy [facility] for tasks assessed/performed      Past Medical History:  Diagnosis Date  . Allergy   . Asthma    pt stated treated as ashtma but not really asthma  . Bipolar disorder ([place])   . Chicken pox   . Complication of anesthesia    oxygen saturation dropped after hysterectomy 2009 at [facility]  . Depression   . Diabetes mellitus   . Dyspnea   . GERD (gastroesophageal reflux disease)   . Hyperlipidemia   . Hypertension    readings  . Hypothyroidism   . Irregular heartbeat    saw dr Ron Parker sees cardiology as needed  . Migraine   . Sleep apnea     Past Surgical History:  Procedure Laterality Date  . [place]   left  . BACK SURGERY  1999  . CESAREAN SECTION     1995  . LAPAROSCOPIC ASSISTED VAGINAL HYSTERECTOMY  2009     BSO fibroids, DUB, pelvic pain  . 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 REPAIR N/A 04/14/2016   Procedure: LAPAROSCOPIC ROUX-EN-Y GASTRIC BYPASS  WITH UPPER ENDOSCOPY;  Surgeon: Excell Seltzer, MD;  [facility];  Service: General;  Laterality: N/A;  . SHOULDER ARTHROSCOPY WITH OPEN ROTATOR CUFF REPAIR Right 01/02/2017   Procedure: Right shoulder arthroscopy, A-subcromial decompression, mini open rotator cuff repair, open distal clavicle resection, biceps tenodesis;  Surgeon: Netta Cedars, MD;  Location: [place];  Service:  Orthopedics;  Laterality: Right;  . THYROIDECTOMY, PARTIAL  2001   removed left  . UMBILICAL HERNIA REPAIR  2003  . WISDOM TOOTH EXTRACTION      There were no vitals filed for this visit.      Subjective Assessment - 02/25/17 0930    Subjective Stiff, no pain.   Currently in Pain? No/denies  Stiff   Multiple Pain Sites No            [facility] PT Assessment - 02/25/17 0001      ROM / Strength   AROM / PROM / Strength AROM     AROM   Right Shoulder Flexion 80 Degrees   Right Shoulder Internal Rotation --  Reaches with mild discomfort to upper lumbar spine today                     [facility] Adult PT Treatment/Exercise - 02/25/17 0001      Shoulder Exercises: Seated   Flexion AROM;AAROM;Right;20 reps   Other Seated Exercises More semireclined position for active flexion 2x10     Shoulder Exercises: Standing   Flexion AAROM;Right  Finger ladder 10x flexion/10x scaption ( min) reaching 21    Extension AROM;Strengthening;Right;20 reps;Theraband   Theraband Level (Shoulder Extension) Level 1 (Yellow)   Row Strengthening;Both;Theraband  3x10   Theraband Level (Shoulder Row) Level 2 (Red)  Shoulder Exercises: Pulleys   Flexion 3 minutes   Other Pulley Exercises Standing IR behind theback   3x10 with PTA assit     Shoulder Exercises: ROM/Strengthening   UBE (Upper Arm Bike) L0 3x3     Moist Heat Therapy   Number Minutes Moist Heat 10 Minutes   Moist Heat Location Shoulder                  PT Short Term Goals - 02/02/17 1457      PT SHORT TERM GOAL #1   Title independent with initial HEP with her husband helping   Status Achieved     PT SHORT TERM GOAL #2   Title understand how to ice the shoulder   Status Achieved     PT SHORT TERM GOAL #3   Title pain in right shoulder decreased >/= 25%   Status Achieved           PT Long Term Goals - 02/16/17 0907      PT LONG TERM GOAL #2   Title right shoulder AROM is WFL so she is able  to dress herself without difficulty   Time 8   Period Weeks   Status On-going  Husband still helps with shirt and shoes     PT LONG TERM GOAL #4   Title wean from sling and report 50% use of Rt UE as allowed by MD   Time 8   Period Weeks   Status Partially Met               Plan - 02/25/17 0945    Clinical Impression Statement Pt is actively able to reach to 80 degrees with excellent mechanics. She can push it to 90 degrees but there is more activation of the upper trap in order to achieve this range. Pain is a nonlimiting factor at this time. Working more towards increasing her AROM today.  She demonstrated rewaching behind her back to the upper lumbar with mild discomfort.    PT Next Visit Plan progress AROM, no abduction, gentle strengthening.      Patient will benefit from skilled therapeutic intervention in order to improve the following deficits and impairments:  Decreased activity tolerance, Decreased mobility, Decreased strength, Increased edema, Decreased scar mobility, Impaired flexibility, Decreased endurance, Decreased coordination, Decreased range of motion, Increased fascial restricitons, Pain  Visit Diagnosis: Acute pain of right shoulder  Muscle weakness (generalized)  Stiffness of right shoulder, not elsewhere classified  Localized edema     Problem List Patient Active Problem List   Diagnosis Date Noted  . Morbid obesity with body mass index (BMI) of 50.0 to 59.9 in adult [facility]) 04/14/2016  . Type 2 diabetes mellitus without complications ([place]) 45/62/5638  . Hypersomnia with sleep apnea 06/20/2015  . Right lumbar radiculopathy 03/13/2014  . Occipital headache 01/03/2014  . 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 Benign paroxysmal positional vertigo 02/03/2013  . Ear pain 03/12/2012  . General medical examination 08/06/2011  . SINUS TACHYCARDIA 08/22/2008  . GERD 08/22/2008  . HYPERLIPIDEMIA, MIXED 05/25/2007  . OBESITY, MORBID 05/25/2007  .  Bipolar disorder ([place]) 05/25/2007  . MIGRAINE, CHRONIC 05/25/2007  . Essential hypertension 05/25/2007  . Allergic rhinitis 05/25/2007  . Obstructive sleep apnea 05/17/2007    Tearsa Kowalewski, PTA 02/25/2017, 10:03 AM  [facility] [address], [place], [place], [address] Phone: [phone]   Fax:  [phone]  Name: [name] MRN: [id number] Date of Birth: Apr 04, 1971

## 2017-02-27 ENCOUNTER — Encounter: Payer: Self-pay | Admitting: Physical Therapy

## 2017-02-27 ENCOUNTER — Ambulatory Visit: Payer: 59 | Admitting: Physical Therapy

## 2017-02-27 DIAGNOSIS — M25511 Pain in right shoulder: Secondary | ICD-10-CM

## 2017-02-27 DIAGNOSIS — M25611 Stiffness of right shoulder, not elsewhere classified: Secondary | ICD-10-CM | POA: Diagnosis not present

## 2017-02-27 DIAGNOSIS — M6281 Muscle weakness (generalized): Secondary | ICD-10-CM | POA: Diagnosis not present

## 2017-02-27 DIAGNOSIS — R600 Localized edema: Secondary | ICD-10-CM

## 2017-02-27 NOTE — Therapy (Signed)
[facility] [address], [place], [place], [address] Phone: [phone]   Fax:  [phone]  Physical Therapy Treatment  Patient Details  Name: [name] MRN: [id number] Date of Birth: 02-08-1971 Referring Provider: Dr. Esmond Plants  Encounter Date: 02/27/2017      PT End of Session - 02/27/17 0931    Visit Number 22   Date for PT Re-Evaluation 03/30/17   Authorization Type UMR   PT Start Time 0927   PT Stop Time 1015   PT Time Calculation (min) 48 min   Activity Tolerance Patient tolerated treatment well   Behavior During Therapy [facility] for tasks assessed/performed      Past Medical History:  Diagnosis Date  . Allergy   . Asthma    pt stated treated as ashtma but not really asthma  . Bipolar disorder ([place])   . Chicken pox   . Complication of anesthesia    oxygen saturation dropped after hysterectomy 2009 at [facility]  . Depression   . Diabetes mellitus   . Dyspnea   . GERD (gastroesophageal reflux disease)   . Hyperlipidemia   . Hypertension    readings  . Hypothyroidism   . Irregular heartbeat    saw dr Ron Parker sees cardiology as needed  . Migraine   . Sleep apnea     Past Surgical History:  Procedure Laterality Date  . [place]   left  . BACK SURGERY  1999  . CESAREAN SECTION     1995  . LAPAROSCOPIC ASSISTED VAGINAL HYSTERECTOMY  2009     BSO fibroids, DUB, pelvic pain  . 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 REPAIR N/A 04/14/2016   Procedure: LAPAROSCOPIC ROUX-EN-Y GASTRIC BYPASS  WITH UPPER ENDOSCOPY;  Surgeon: Excell Seltzer, MD;  Location: [facility];  Service: General;  Laterality: N/A;  . SHOULDER ARTHROSCOPY WITH OPEN ROTATOR CUFF REPAIR Right 01/02/2017   Procedure: Right shoulder arthroscopy, A-subcromial decompression, mini open rotator cuff repair, open distal clavicle resection, biceps tenodesis;  Surgeon: Netta Cedars, MD;  [place];  Service:  Orthopedics;  Laterality: Right;  . THYROIDECTOMY, PARTIAL  2001   removed left  . UMBILICAL HERNIA REPAIR  2003  . WISDOM TOOTH EXTRACTION      There were no vitals filed for this visit.      Subjective Assessment - 02/27/17 0929    Subjective Still feels stiffness, reports minimal pain last night but just stiff this morning.  States work is going well.     Patient Stated Goals reduce pain and improve mobility and strength   Currently in Pain? No/denies                         [facility] Adult PT Treatment/Exercise - 02/27/17 0001      Shoulder Exercises: Supine   Other Supine Exercises semi-reclined - ER with cane 2x10 5 sec holds     Shoulder Exercises: Seated   Flexion AAROM;Right   Other Seated Exercises semi reclined     Shoulder Exercises: Standing   Flexion AAROM;Right  Finger ladder 10x flexion/10x scaption ( min) reaching 21    Extension AROM;Strengthening;Right;20 reps;Theraband   Theraband Level (Shoulder Extension) Level 1 (Yellow)   Row Strengthening;Both;Theraband  3x10   Theraband Level (Shoulder Row) Level 2 (Red)   Other Standing Exercises UE Ranger on #35 10x2 each     Shoulder Exercises: Pulleys   Flexion 3 minutes  Other Pulley Exercises Standing IR behind theback   3x10 with PT assit     Shoulder Exercises: ROM/Strengthening   UBE (Upper Arm Bike) L0 3x3     Moist Heat Therapy   Number Minutes Moist Heat 10 Minutes   Moist Heat Location Shoulder                  PT Short Term Goals - 02/02/17 1457      PT SHORT TERM GOAL #1   Title independent with initial HEP with her husband helping   Status Achieved     PT SHORT TERM GOAL #2   Title understand how to ice the shoulder   Status Achieved     PT SHORT TERM GOAL #3   Title pain in right shoulder decreased >/= 25%   Status Achieved           PT Long Term Goals - 02/16/17 0907      PT LONG TERM GOAL #2   Title right shoulder AROM is WFL so she is able to  dress herself without difficulty   Time 8   Period Weeks   Status On-going  Husband still helps with shirt and shoes     PT LONG TERM GOAL #4   Title wean from sling and report 50% use of Rt UE as allowed by MD   Time 8   Period Weeks   Status Partially Met               Plan - 02/27/17 0932    Clinical Impression Statement Pt continues to demonstrate improved ROM within limits of protocol.  She continues to need skilled PT in order to increase all ROM back to functional activities.   Rehab Potential Excellent   Clinical Impairments Affecting Rehab Potential No shoulder abduction; AAROM to right shoulder gently, isometrics;    PT Treatment/Interventions Cryotherapy;Electrical Stimulation;Moist Heat;Ultrasound;Patient/family education;Neuromuscular re-education;Therapeutic exercise;Therapeutic activities;Taping;Vasopneumatic Device;Passive range of motion;Scar mobilization;Manual techniques   PT Next Visit Plan progress AROM, no abduction, gentle strengthening.   Consulted and Agree with Plan of Care Patient      Patient will benefit from skilled therapeutic intervention in order to improve the following deficits and impairments:  Decreased activity tolerance, Decreased mobility, Decreased strength, Increased edema, Decreased scar mobility, Impaired flexibility, Decreased endurance, Decreased coordination, Decreased range of motion, Increased fascial restricitons, Pain  Visit Diagnosis: Acute pain of right shoulder  Muscle weakness (generalized)  Stiffness of right shoulder, not elsewhere classified  Localized edema     Problem List Patient Active Problem List   Diagnosis Date Noted  . Morbid obesity with body mass index (BMI) of 50.0 to 59.9 in adult [facility]) 04/14/2016  . Type 2 diabetes mellitus without complications ([place]) 32/54/9826  . Hypersomnia with sleep apnea 06/20/2015  . Right lumbar radiculopathy 03/13/2014  . Occipital headache 01/03/2014  . Lichen  simplex chronicus 09/21/2013  . Benign paroxysmal positional vertigo 02/03/2013  . Ear pain 03/12/2012  . General medical examination 08/06/2011  . SINUS TACHYCARDIA 08/22/2008  . GERD 08/22/2008  . HYPERLIPIDEMIA, MIXED 05/25/2007  . OBESITY, MORBID 05/25/2007  . Bipolar disorder ([place]) 05/25/2007  . MIGRAINE, CHRONIC 05/25/2007  . Essential hypertension 05/25/2007  . Allergic rhinitis 05/25/2007  . Obstructive sleep apnea 05/17/2007    Zannie Cove, PT 02/27/2017, 10:03 AM  [facility] [address] [place], [place], [address] Phone: [phone]   Fax:  [phone]  Name: [name] MRN: [id number] Date  of Birth: [birth year]

## 2017-03-02 ENCOUNTER — Encounter: Payer: Self-pay | Admitting: Physical Therapy

## 2017-03-02 ENCOUNTER — Ambulatory Visit: Payer: 59 | Admitting: Physical Therapy

## 2017-03-02 DIAGNOSIS — M25611 Stiffness of right shoulder, not elsewhere classified: Secondary | ICD-10-CM

## 2017-03-02 DIAGNOSIS — R600 Localized edema: Secondary | ICD-10-CM | POA: Diagnosis not present

## 2017-03-02 DIAGNOSIS — M6281 Muscle weakness (generalized): Secondary | ICD-10-CM | POA: Diagnosis not present

## 2017-03-02 DIAGNOSIS — M25511 Pain in right shoulder: Secondary | ICD-10-CM | POA: Diagnosis not present

## 2017-03-02 MED FILL — METHOCARBAMOL 500 MG TABLET: 500 | 15 days supply | Qty: 60 | Fill #0

## 2017-03-02 MED FILL — tiaGABine HCL 4 MG TABS: 4 | 90 days supply | Qty: 360 | Fill #0

## 2017-03-02 NOTE — Therapy (Signed)
[facility] [address], [address] [place], [place], [address] Phone: [phone]   Fax:  [phone]  Physical Therapy Treatment  Patient Details  Name: [name] MRN: [id number] Date of Birth: March 04, 1971 Referring Provider: Dr. Malon Kindle  Encounter Date: 03/02/2017      PT End of Session - 03/02/17 0929    Visit Number 23   Date for PT Re-Evaluation 03/30/17   Authorization Type UMR   PT Start Time 0928   PT Stop Time 1015   PT Time Calculation (min) 47 min   Activity Tolerance Patient tolerated treatment well   Behavior During Therapy [facility] for tasks assessed/performed      Past Medical History:  Diagnosis Date  . Allergy   . Asthma    pt stated treated as ashtma but not really asthma  . Bipolar disorder (HCC)   . Chicken pox   . Complication of anesthesia    oxygen saturation dropped after hysterectomy 2009 at [facility] [place] [facility]  . Depression   . Diabetes mellitus   . Dyspnea   . GERD (gastroesophageal reflux disease)   . Hyperlipidemia   . Hypertension    readings  . Hypothyroidism   . Irregular heartbeat    saw dr Myrtis Ser sees cardiology as needed  . Migraine   . Sleep apnea     Past Surgical History:  Procedure Laterality Date  . ANKLE SURGERY  1989   left  . BACK SURGERY  1999  . CESAREAN SECTION     1995  . LAPAROSCOPIC ASSISTED VAGINAL HYSTERECTOMY  2009     BSO fibroids, DUB, pelvic pain  . 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 SHOULDER ARTHROSCOPY WITH OPEN ROTATOR CUFF REPAIR Right 01/02/2017   Procedure: Right shoulder arthroscopy, A-subcromial decompression, mini open rotator cuff repair, open distal clavicle resection, biceps tenodesis;  Surgeon: Beverely Low, MD;  Location: [facility];  Service:  Orthopedics;  Laterality: Right;  . THYROIDECTOMY, PARTIAL  2001   removed left  . UMBILICAL HERNIA REPAIR  2003  . WISDOM TOOTH EXTRACTION      There were no vitals filed for this visit.      Subjective Assessment - 03/02/17 0931    Subjective A little stiff, no pain.   Currently in Pain? No/denies   Multiple Pain Sites No                         [facility] Adult PT Treatment/Exercise - 03/02/17 0001      Shoulder Exercises: Seated   Other Seated Exercises semi reclined, RTUE punches 20x  Pt with increased AROM   Other Seated Exercises Seated forward reach for cones 2x 10:     Shoulder Exercises: Sidelying   External Rotation AROM;Right   Theraband Level (Shoulder External Rotation) --  3x10 No asst today     Shoulder Exercises: Standing   Flexion AAROM;Right  Finger ladder 10x flexion/10x scaption ( min) reaching 21    Extension AROM;Strengthening;Right;20 reps;Theraband   Theraband Level (Shoulder Extension) Level 1 (Yellow)   Row Strengthening;Both;Theraband  3x10   Theraband Level (Shoulder Row) Level 2 (Red)   Other Standing Exercises UE Ranger on #35 10x2 each  No asst needed by PTA     Shoulder Exercises: Pulleys   Flexion  3 minutes   Other Pulley Exercises Standing IR behind theback   3x10 with PT assit     Shoulder Exercises: ROM/Strengthening   UBE (Upper Arm Bike) L0 3x3                  PT Short Term Goals - 02/02/17 1457      PT SHORT TERM GOAL #1   Title independent with initial HEP with her husband helping   Status Achieved     PT SHORT TERM GOAL #2   Title understand how to ice the shoulder   Status Achieved     PT SHORT TERM GOAL #3   Title pain in right shoulder decreased >/= 25%   Status Achieved           PT Long Term Goals - 03/02/17 0930      PT LONG TERM GOAL #1   Title Independent with HEP and how to progress herself   Time 8   Period Weeks   Status On-going     PT LONG TERM GOAL #2   Title  right shoulder AROM is WFL so she is able to dress herself without difficulty   Baseline not cleared for A/ROM and requires assistance with dressing   Time 8   Period Weeks   Status Achieved     PT LONG TERM GOAL #3   Title right shoulder strength is >/= 4/5 so she is able to perform her work tasks without increase in pain   Baseline not cleared for strengthening right now   Time 8   Period Weeks   Status On-going     PT LONG TERM GOAL #4   Title wean from sling and report 50% use of Rt UE as allowed by MD   Time 8   Period Weeks   Status Achieved     PT LONG TERM GOAL #5   Title able to lift light object like her purse due to right shoulder strength >/= 4/5 and no restrictions from the MD   Time 8   Period Weeks   Status On-going               Plan - 03/02/17 1002    Clinical Impression Statement Pt can independently dress at this time including her shoes. This meets the goal for dressing. Pt continues to improve her AROM in functional movements.    Clinical Impairments Affecting Rehab Potential No shoulder abduction; A/AAROM to right shoulder gently, isometrics;    PT Next Visit Plan progress AROM, no abduction, gentle strengthening. Measur AROm end of week.       Patient will benefit from skilled therapeutic intervention in order to improve the following deficits and impairments:     Visit Diagnosis: Acute pain of right shoulder  Muscle weakness (generalized)  Stiffness of right shoulder, not elsewhere classified  Localized edema     Problem List Patient Active Problem List   Diagnosis Date Noted  . Morbid obesity with body mass index (BMI) of 50.0 to 59.9 in adult [facility]) 04/14/2016  . Type 2 diabetes mellitus without complications (HCC) 10/05/2015  . Hypersomnia with sleep apnea 06/20/2015  . Right lumbar radiculopathy 03/13/2014  . Occipital headache 01/03/2014  . 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 Benign paroxysmal positional vertigo 02/03/2013   . Ear pain 03/12/2012  . General medical examination 08/06/2011  . SINUS TACHYCARDIA 08/22/2008  . GERD 08/22/2008  . HYPERLIPIDEMIA, MIXED 05/25/2007  . OBESITY, MORBID 05/25/2007  .  Bipolar disorder (HCC) 05/25/2007  . MIGRAINE, CHRONIC 05/25/2007  . Essential hypertension 05/25/2007  . Allergic rhinitis 05/25/2007  . Obstructive sleep apnea 05/17/2007    COCHRAN,JENNIFER, PTA 03/02/2017, 10:07 AM  [facility] [address], [address] [place], [place], [address] Phone: [phone]   Fax:  [phone]  Name: Payzlee Ryder MRN: [id number] Date of Birth: 04-26-1971

## 2017-03-04 ENCOUNTER — Encounter: Payer: Self-pay | Admitting: Physical Therapy

## 2017-03-05 ENCOUNTER — Encounter: Payer: Self-pay | Admitting: Family Medicine

## 2017-03-06 ENCOUNTER — Ambulatory Visit: Payer: 59 | Admitting: Physical Therapy

## 2017-03-06 ENCOUNTER — Encounter: Payer: Self-pay | Admitting: Physical Therapy

## 2017-03-06 DIAGNOSIS — R600 Localized edema: Secondary | ICD-10-CM

## 2017-03-06 DIAGNOSIS — M25611 Stiffness of right shoulder, not elsewhere classified: Secondary | ICD-10-CM | POA: Diagnosis not present

## 2017-03-06 DIAGNOSIS — M6281 Muscle weakness (generalized): Secondary | ICD-10-CM | POA: Diagnosis not present

## 2017-03-06 DIAGNOSIS — M25511 Pain in right shoulder: Secondary | ICD-10-CM

## 2017-03-06 NOTE — Therapy (Signed)
[facility] [address] [place], [place], [address] Phone: [phone]   Fax:  [phone]  Physical Therapy Treatment  Patient Details  Name: [name] MRN: [id number] Date of Birth: 05/09/1971 Referring Provider: Dr. Malon Kindle  Encounter Date: 03/06/2017      PT End of Session - 03/06/17 0933    Visit Number 24   Date for PT Re-Evaluation 03/30/17   Authorization Type UMR   PT Start Time 0925   PT Stop Time 1020   PT Time Calculation (min) 55 min   Activity Tolerance Patient tolerated treatment well   Behavior During Therapy [facility] for tasks assessed/performed      Past Medical History:  Diagnosis Date  . Allergy   . Asthma    pt stated treated as ashtma but not really asthma  . Bipolar disorder (HCC)   . Chicken pox   . Complication of anesthesia    oxygen saturation dropped after hysterectomy 2009 at [facility]  . Depression   . Diabetes mellitus   . Dyspnea   . GERD (gastroesophageal reflux disease)   . Hyperlipidemia   . Hypertension    readings  . Hypothyroidism   . Irregular heartbeat    saw dr Myrtis Ser sees cardiology as needed  . Migraine   . Sleep apnea     Past Surgical History:  Procedure Laterality Date  . ANKLE SURGERY  1989   left  . BACK SURGERY  1999  . CESAREAN SECTION     1995  . LAPAROSCOPIC ASSISTED VAGINAL HYSTERECTOMY  2009     BSO fibroids, DUB, pelvic pain  . 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 SHOULDER ARTHROSCOPY WITH OPEN ROTATOR CUFF REPAIR Right 01/02/2017   Procedure: Right shoulder arthroscopy, A-subcromial decompression, mini open rotator cuff repair, open distal clavicle resection, biceps tenodesis;  Surgeon: Beverely Low, MD;  Location: [facility];  Service:  Orthopedics;  Laterality: Right;  . THYROIDECTOMY, PARTIAL  2001   removed left  . UMBILICAL HERNIA REPAIR  2003  . WISDOM TOOTH EXTRACTION      There were no vitals filed for this visit.      Subjective Assessment - 03/06/17 0934    Subjective No pain, just stiff. I can use my arm for more "stuff" now.   Currently in Pain? No/denies   Pain Descriptors / Indicators Tightness   Multiple Pain Sites No            [facility] PT Assessment - 03/06/17 0001      AROM   Overall AROM  --  6 inch diff reaching behind back Rt & Lt   Right Shoulder Flexion 97 Degrees                     [facility] Adult PT Treatment/Exercise - 03/06/17 0001      Shoulder Exercises: Seated   Other Seated Exercises Flexion punches overhead with min asst 3x10      Shoulder Exercises: Standing   Flexion AAROM;Right  Finger ladder 10x flexion/10x scaption ( min) reaching 24    Theraband Level (Shoulder Flexion) --  1# on wrist for flexion only   Extension AROM;Strengthening;Right;20 reps;Theraband   Theraband Level (Shoulder Extension) Level 1 (Yellow)   Row Strengthening;Both;Theraband  3x10   Theraband Level (  Shoulder Row) Level 2 (Red)  3x 10   Other Standing Exercises UE Ranger on #35 10x2 each  No asst needed by PTA     Shoulder Exercises: Pulleys   Flexion 3 minutes   Other Pulley Exercises Standing IR behind theback   3x10 with PT assit     Shoulder Exercises: ROM/Strengthening   UBE (Upper Arm Bike) L0 3x3     Moist Heat Therapy   Number Minutes Moist Heat 15 Minutes   Moist Heat Location Shoulder  post session                  PT Short Term Goals - 02/02/17 1457      PT SHORT TERM GOAL #1   Title independent with initial HEP with her husband helping   Status Achieved     PT SHORT TERM GOAL #2   Title understand how to ice the shoulder   Status Achieved     PT SHORT TERM GOAL #3   Title pain in right shoulder decreased >/= 25%   Status Achieved            PT Long Term Goals - 03/02/17 0930      PT LONG TERM GOAL #1   Title Independent with HEP and how to progress herself   Time 8   Period Weeks   Status On-going     PT LONG TERM GOAL #2   Title right shoulder AROM is WFL so she is able to dress herself without difficulty   Baseline not cleared for A/ROM and requires assistance with dressing   Time 8   Period Weeks   Status Achieved     PT LONG TERM GOAL #3   Title right shoulder strength is >/= 4/5 so she is able to perform her work tasks without increase in pain   Baseline not cleared for strengthening right now   Time 8   Period Weeks   Status On-going     PT LONG TERM GOAL #4   Title wean from sling and report 50% use of Rt UE as allowed by MD   Time 8   Period Weeks   Status Achieved     PT LONG TERM GOAL #5   Title able to lift light object like her purse due to right shoulder strength >/= 4/5 and no restrictions from the MD   Time 8   Period Weeks   Status On-going               Plan - 03/06/17 0933    Clinical Impression Statement Pt demonstrates 97 degrees of shoulder flexion today. Last measurement was 80 degrees. She reports most functional activities/ADLs are easier to do. Reaching behind the back is still slow and stiff, requiring a lot of stretching, but she continues to reach the upper lumbar area.    Rehab Potential Excellent   Clinical Impairments Affecting Rehab Potential No shoulder abduction; A/AAROM to right shoulder gently, isometrics;    PT Frequency 3x / week   PT Duration 8 weeks   PT Treatment/Interventions Cryotherapy;Electrical Stimulation;Moist Heat;Ultrasound;Patient/family education;Neuromuscular re-education;Therapeutic exercise;Therapeutic activities;Taping;Vasopneumatic Device;Passive range of motion;Scar mobilization;Manual techniques   PT Next Visit Plan progress AROM, no abduction, gentle strengthening in functional manner.   Consulted and Agree with Plan of Care  Patient      Patient will benefit from skilled therapeutic intervention in order to improve the following deficits and impairments:  Decreased activity tolerance, Decreased mobility, Decreased strength, Increased edema,  Decreased scar mobility, Impaired flexibility, Decreased endurance, Decreased coordination, Decreased range of motion, Increased fascial restricitons, Pain  Visit Diagnosis: Acute pain of right shoulder  Muscle weakness (generalized)  Stiffness of right shoulder, not elsewhere classified  Localized edema     Problem List Patient Active Problem List   Diagnosis Date Noted  . Morbid obesity with body mass index (BMI) of 50.0 to 59.9 in adult [facility]) 04/14/2016  . Type 2 diabetes mellitus without complications (HCC) 10/05/2015  . Hypersomnia with sleep apnea 06/20/2015  . Right lumbar radiculopathy 03/13/2014  . Occipital headache 01/03/2014  . 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 Benign paroxysmal positional vertigo 02/03/2013  . Ear pain 03/12/2012  . General medical examination 08/06/2011  . SINUS TACHYCARDIA 08/22/2008  . GERD 08/22/2008  . HYPERLIPIDEMIA, MIXED 05/25/2007  . OBESITY, MORBID 05/25/2007  . Bipolar disorder (HCC) 05/25/2007  . MIGRAINE, CHRONIC 05/25/2007  . Essential hypertension 05/25/2007  . Allergic rhinitis 05/25/2007  . Obstructive sleep apnea 05/17/2007    COCHRAN,JENNIFER,PTA 03/06/2017, 10:07 AM  [facility] [address] [place], [place], [address] Phone: [phone]   Fax:  [phone]  Name: [name] MRN: [id number] Date of Birth: [birth year]

## 2017-03-09 ENCOUNTER — Ambulatory Visit: Payer: 59 | Admitting: Physical Therapy

## 2017-03-09 ENCOUNTER — Encounter: Payer: Self-pay | Admitting: Physical Therapy

## 2017-03-09 DIAGNOSIS — R600 Localized edema: Secondary | ICD-10-CM

## 2017-03-09 DIAGNOSIS — M25511 Pain in right shoulder: Secondary | ICD-10-CM | POA: Diagnosis not present

## 2017-03-09 DIAGNOSIS — M6281 Muscle weakness (generalized): Secondary | ICD-10-CM | POA: Diagnosis not present

## 2017-03-09 DIAGNOSIS — M25611 Stiffness of right shoulder, not elsewhere classified: Secondary | ICD-10-CM | POA: Diagnosis not present

## 2017-03-09 NOTE — Therapy (Signed)
[facility] [address] [place], [place], [address] Phone: [phone]   Fax:  [phone]  Physical Therapy Treatment  Patient Details  Name: [name] MRN: [id number] Date of Birth: Mar 26, 1971 Referring Provider: Dr. Malon Kindle  Encounter Date: 03/09/2017      PT End of Session - 03/09/17 0926    Visit Number 25   Date for PT Re-Evaluation 03/30/17   Authorization Type UMR   PT Start Time 0924   PT Stop Time 1020   PT Time Calculation (min) 56 min   Activity Tolerance Patient tolerated treatment well   Behavior During Therapy [facility] for tasks assessed/performed      Past Medical History:  Diagnosis Date  . Allergy   . Asthma    pt stated treated as ashtma but not really asthma  . Bipolar disorder (HCC)   . Chicken pox   . Complication of anesthesia    oxygen saturation dropped after hysterectomy 2009 at [facility]  . Depression   . Diabetes mellitus   . Dyspnea   . GERD (gastroesophageal reflux disease)   . Hyperlipidemia   . Hypertension    readings  . Hypothyroidism   . Irregular heartbeat    saw dr Myrtis Ser sees cardiology as needed  . Migraine   . Sleep apnea     Past Surgical History:  Procedure Laterality Date  . ANKLE SURGERY  1989   left  . BACK SURGERY  1999  . CESAREAN SECTION     1995  . LAPAROSCOPIC ASSISTED VAGINAL HYSTERECTOMY  2009     BSO fibroids, DUB, pelvic pain  . 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 SHOULDER ARTHROSCOPY WITH OPEN ROTATOR CUFF REPAIR Right 01/02/2017   Procedure: Right shoulder arthroscopy, A-subcromial decompression, mini open rotator cuff repair, open distal clavicle resection, biceps tenodesis;  Surgeon: Beverely Low, MD;  Location: [facility] OR;  Service:  Orthopedics;  Laterality: Right;  . THYROIDECTOMY, PARTIAL  2001   removed left  . UMBILICAL HERNIA REPAIR  2003  . WISDOM TOOTH EXTRACTION      There were no vitals filed for this visit.      Subjective Assessment - 03/09/17 0927    Subjective Just a little achey from the weather.   Currently in Pain? Yes   Pain Score 2    Pain Location Shoulder   Pain Orientation Right   Pain Descriptors / Indicators Aching   Aggravating Factors  weather   Pain Relieving Factors heat, ice   Multiple Pain Sites No                         [facility] Adult PT Treatment/Exercise - 03/09/17 0001      Shoulder Exercises: Supine   Other Supine Exercises Cane over head 2x10   Other Supine Exercises Supine circles at 90 degrees 10x each way     Shoulder Exercises: Seated   Other Seated Exercises Flexion punches overhead with min asst 3x10      Shoulder Exercises: Sidelying   External Rotation AROM;Right   Theraband Level (Shoulder External Rotation) --  3x10 No asst today     Shoulder Exercises: Standing   Flexion AAROM;Right  Finger ladder 10x flexion/10x scaption ( min) reaching 25    Extension  AROM;Strengthening;Right;20 reps;Theraband   Theraband Level (Shoulder Extension) Level 1 (Yellow)   Extension Weight (lbs) --  Prone at counter top ( bent over) 2x10   Row Strengthening;Both;Theraband  3x10   Theraband Level (Shoulder Row) Level 3 (Green)  3x 10   Row Weight (lbs) --  Bent over rows 2x10 at counter   Other Standing Exercises UE Ranger on #35 10x2 each  No asst needed by PTA   Other Standing Exercises Rooling red ball up wall 10x     Shoulder Exercises: Pulleys   Flexion 3 minutes   Other Pulley Exercises Standing IR behind theback   3x10 with PT assit     Shoulder Exercises: ROM/Strengthening   UBE (Upper Arm Bike) L0 3x3     Moist Heat Therapy   Number Minutes Moist Heat 15 Minutes   Moist Heat Location Shoulder  post session                   PT Short Term Goals - 02/02/17 1457      PT SHORT TERM GOAL #1   Title independent with initial HEP with her husband helping   Status Achieved     PT SHORT TERM GOAL #2   Title understand how to ice the shoulder   Status Achieved     PT SHORT TERM GOAL #3   Title pain in right shoulder decreased >/= 25%   Status Achieved           PT Long Term Goals - 03/02/17 0930      PT LONG TERM GOAL #1   Title Independent with HEP and how to progress herself   Time 8   Period Weeks   Status On-going     PT LONG TERM GOAL #2   Title right shoulder AROM is WFL so she is able to dress herself without difficulty   Baseline not cleared for A/ROM and requires assistance with dressing   Time 8   Period Weeks   Status Achieved     PT LONG TERM GOAL #3   Title right shoulder strength is >/= 4/5 so she is able to perform her work tasks without increase in pain   Baseline not cleared for strengthening right now   Time 8   Period Weeks   Status On-going     PT LONG TERM GOAL #4   Title wean from sling and report 50% use of Rt UE as allowed by MD   Time 8   Period Weeks   Status Achieved     PT LONG TERM GOAL #5   Title able to lift light object like her purse due to right shoulder strength >/= 4/5 and no restrictions from the MD   Time 8   Period Weeks   Status On-going               Plan - 03/09/17 0926    Clinical Impression Statement Pt continues to demonstrate improvements in her AROM. There was visable shaking and fatigue throughout the session due to weakness. No pain during the session.    Rehab Potential Excellent   Clinical Impairments Affecting Rehab Potential No shoulder abduction; A/AAROM to right shoulder gently, scap stabilization   PT Frequency 3x / week   PT Duration 8 weeks   PT Treatment/Interventions Cryotherapy;Electrical Stimulation;Moist Heat;Ultrasound;Patient/family education;Neuromuscular re-education;Therapeutic exercise;Therapeutic  activities;Taping;Vasopneumatic Device;Passive range of motion;Scar mobilization;Manual techniques   PT Next Visit Plan progress AROM, no abduction, gentle strengthening in functional manner.  Consulted and Agree with Plan of Care Patient      Patient will benefit from skilled therapeutic intervention in order to improve the following deficits and impairments:  Decreased activity tolerance, Decreased mobility, Decreased strength, Increased edema, Decreased scar mobility, Impaired flexibility, Decreased endurance, Decreased coordination, Decreased range of motion, Increased fascial restricitons, Pain  Visit Diagnosis: Acute pain of right shoulder  Muscle weakness (generalized)  Stiffness of right shoulder, not elsewhere classified  Localized edema     Problem List Patient Active Problem List   Diagnosis Date Noted  . Morbid obesity with body mass index (BMI) of 50.0 to 59.9 in adult [facility]) 04/14/2016  . Type 2 diabetes mellitus without complications (HCC) 10/05/2015  . Hypersomnia with sleep apnea 06/20/2015  . Right lumbar radiculopathy 03/13/2014  . Occipital headache 01/03/2014  . 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 Benign paroxysmal positional vertigo 02/03/2013  . Ear pain 03/12/2012  . General medical examination 08/06/2011  . SINUS TACHYCARDIA 08/22/2008  . GERD 08/22/2008  . HYPERLIPIDEMIA, MIXED 05/25/2007  . OBESITY, MORBID 05/25/2007  . Bipolar disorder (HCC) 05/25/2007  . MIGRAINE, CHRONIC 05/25/2007  . Essential hypertension 05/25/2007  . Allergic rhinitis 05/25/2007  . Obstructive sleep apnea 05/17/2007    Lorrain Rivers, PTA 03/09/2017, 10:08 AM  [facility] [address], [address] [place], [place], [address] Phone: [phone]   Fax:  [phone]  Name: Shawneequa Baldridge MRN: [id number] Date of Birth: 1971-03-25

## 2017-03-10 MED FILL — LISINOPRIL 20 MG TABLET: 20 | 90 days supply | Qty: 90 | Fill #0

## 2017-03-11 ENCOUNTER — Encounter: Payer: Self-pay | Admitting: Physical Therapy

## 2017-03-11 ENCOUNTER — Ambulatory Visit: Payer: 59 | Admitting: Physical Therapy

## 2017-03-11 DIAGNOSIS — R600 Localized edema: Secondary | ICD-10-CM

## 2017-03-11 DIAGNOSIS — M25511 Pain in right shoulder: Secondary | ICD-10-CM

## 2017-03-11 DIAGNOSIS — M25611 Stiffness of right shoulder, not elsewhere classified: Secondary | ICD-10-CM

## 2017-03-11 DIAGNOSIS — M6281 Muscle weakness (generalized): Secondary | ICD-10-CM

## 2017-03-11 NOTE — Therapy (Signed)
[facility] [address] [place], [place], [address] Phone: [phone]   Fax:  [phone]  Physical Therapy Treatment  Patient Details  Name: [name] MRN: [id number] Date of Birth: 09/26/1970 Referring Provider: Dr. Malon KindleSteven Norris  Encounter Date: 03/11/2017      PT End of Session - 03/11/17 0919    Visit Number 26   Date for PT Re-Evaluation 03/30/17   Authorization Type UMR   PT Start Time 0919   PT Stop Time 1015   PT Time Calculation (min) 56 min   Activity Tolerance Patient tolerated treatment well   Behavior During Therapy [facility] for tasks assessed/performed      Past Medical History:  Diagnosis Date  . Allergy   . Asthma    pt stated treated as ashtma but not really asthma  . Bipolar disorder (HCC)   . Chicken pox   . Complication of anesthesia    [facility]  . Depression   . Diabetes mellitus   . Dyspnea   . GERD (gastroesophageal reflux disease)   . Hyperlipidemia   . Hypertension    readings  . Hypothyroidism   . Irregular heartbeat    saw dr Myrtis SerKatz sees cardiology as needed  . Migraine   . Sleep apnea     Past Surgical History:  Procedure Laterality Date  . ANKLE SURGERY  1989   left  . BACK SURGERY  1999  . CESAREAN SECTION     1995  . LAPAROSCOPIC ASSISTED VAGINAL HYSTERECTOMY  2009     BSO fibroids, DUB, pelvic pain  . 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 SHOULDER ARTHROSCOPY WITH OPEN ROTATOR CUFF REPAIR Right 01/02/2017   Procedure: Right shoulder arthroscopy, A-subcromial decompression, mini open rotator cuff repair, open distal clavicle resection, biceps tenodesis;  Surgeon: Beverely LowNorris, Steve, MD;  [facility];  Service:  Orthopedics;  Laterality: Right;  . THYROIDECTOMY, PARTIAL  2001   removed left  . UMBILICAL HERNIA REPAIR  2003  . WISDOM TOOTH EXTRACTION      There were no vitals filed for this visit.      Subjective Assessment - 03/11/17 0920    Subjective Did a lot of paper work yesterday and my shoulder felt sore after.    Currently in Pain? Yes   Pain Score 1    Pain Location Shoulder   Pain Orientation Right   Pain Descriptors / Indicators Aching   Multiple Pain Sites No                         [facility] Adult PT Treatment/Exercise - 03/11/17 0001      Shoulder Exercises: Supine   Other Supine Exercises Supine circles at 90 degrees 10x2  each way     Shoulder Exercises: Seated   Other Seated Exercises Flexion punches overhead with min asst 3x10      Shoulder Exercises: Sidelying   External Rotation AROM;Right   Theraband Level (Shoulder External Rotation) --  3x10 No asst today     Shoulder Exercises: Standing   Flexion AAROM;Right  Finger ladder 10x flexion/10x scaption ( min) reaching 25    Extension AROM;Strengthening;Right;20 reps;Theraband   Theraband Level (Shoulder Extension) Level 1 (Yellow)   Row  Strengthening;Both;Theraband  3x10   Theraband Level (Shoulder Row) Level 3 (Green)  3x 10   Shoulder Elevation --  Cones to first shelf 6 2x   Other Standing Exercises UE Ranger on #35 10x2 each  No asst needed by PTA   Other Standing Exercises Rolling red ball up wall 2x10     Shoulder Exercises: Pulleys   Flexion 3 minutes   Other Pulley Exercises Standing IR behind theback   3x10 with PT assit     Shoulder Exercises: ROM/Strengthening   UBE (Upper Arm Bike) L0 3x3     Moist Heat Therapy   Number Minutes Moist Heat 15 Minutes   Moist Heat Location Shoulder  post session                  PT Short Term Goals - 02/02/17 1457      PT SHORT TERM GOAL #1   Title independent with initial HEP with her husband helping   Status Achieved      PT SHORT TERM GOAL #2   Title understand how to ice the shoulder   Status Achieved     PT SHORT TERM GOAL #3   Title pain in right shoulder decreased >/= 25%   Status Achieved           PT Long Term Goals - 03/02/17 0930      PT LONG TERM GOAL #1   Title Independent with HEP and how to progress herself   Time 8   Period Weeks   Status On-going     PT LONG TERM GOAL #2   Title right shoulder AROM is WFL so she is able to dress herself without difficulty   Baseline not cleared for A/ROM and requires assistance with dressing   Time 8   Period Weeks   Status Achieved     PT LONG TERM GOAL #3   Title right shoulder strength is >/= 4/5 so she is able to perform her work tasks without increase in pain   Baseline not cleared for strengthening right now   Time 8   Period Weeks   Status On-going     PT LONG TERM GOAL #4   Title wean from sling and report 50% use of Rt UE as allowed by MD   Time 8   Period Weeks   Status Achieved     PT LONG TERM GOAL #5   Title able to lift light object like her purse due to right shoulder strength >/= 4/5 and no restrictions from the MD   Time 8   Period Weeks   Status On-going               Plan - 03/11/17 0919    Clinical Impression Statement Continues to do excellent restoring her AROM, still weak but pt under MD restrictions for no lifting yet.  PT was less shakey today.    Rehab Potential Excellent   Clinical Impairments Affecting Rehab Potential No shoulder abduction; A/AAROM to right shoulder gently, scap stabilization   PT Frequency 3x / week   PT Duration 8 weeks   PT Treatment/Interventions Cryotherapy;Electrical Stimulation;Moist Heat;Ultrasound;Patient/family education;Neuromuscular re-education;Therapeutic exercise;Therapeutic activities;Taping;Vasopneumatic Device;Passive range of motion;Scar mobilization;Manual techniques   PT Next Visit Plan progress AROM, no abduction, gentle strengthening in functional  manner.   Consulted and Agree with Plan of Care Patient      Patient will benefit from skilled therapeutic intervention in order to improve the following deficits and impairments:  Decreased activity tolerance, Decreased mobility, Decreased strength, Increased edema, Decreased scar mobility, Impaired flexibility, Decreased endurance, Decreased coordination, Decreased range of motion, Increased fascial restricitons, Pain  Visit Diagnosis: Acute pain of right shoulder  Muscle weakness (generalized)  Stiffness of right shoulder, not elsewhere classified  Localized edema     Problem List Patient Active Problem List   Diagnosis Date Noted  . Morbid obesity with body mass index (BMI) of 50.0 to 59.9 in adult [facility]) 04/14/2016  . Type 2 diabetes mellitus without complications (HCC) 10/05/2015  . Hypersomnia with sleep apnea 06/20/2015  . Right lumbar radiculopathy 03/13/2014  . Occipital headache 01/03/2014  . 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 Benign paroxysmal positional vertigo 02/03/2013  . Ear pain 03/12/2012  . General medical examination 08/06/2011  . SINUS TACHYCARDIA 08/22/2008  . GERD 08/22/2008  . HYPERLIPIDEMIA, MIXED 05/25/2007  . OBESITY, MORBID 05/25/2007  . Bipolar disorder (HCC) 05/25/2007  . MIGRAINE, CHRONIC 05/25/2007  . Essential hypertension 05/25/2007  . Allergic rhinitis 05/25/2007  . Obstructive sleep apnea 05/17/2007    COCHRAN,JENNIFER, PTA 03/11/2017, 10:07 AM  [facility] [address], [address] [place], [place], [address] Phone: [phone]   Fax:  [phone]  Name: [name] MRN: [id number] Date of Birth: Mar 23, 1971

## 2017-03-13 ENCOUNTER — Encounter: Payer: Self-pay | Admitting: Physical Therapy

## 2017-03-13 ENCOUNTER — Ambulatory Visit: Payer: 59 | Admitting: Physical Therapy

## 2017-03-13 DIAGNOSIS — M25611 Stiffness of right shoulder, not elsewhere classified: Secondary | ICD-10-CM

## 2017-03-13 DIAGNOSIS — M25511 Pain in right shoulder: Secondary | ICD-10-CM

## 2017-03-13 DIAGNOSIS — R600 Localized edema: Secondary | ICD-10-CM

## 2017-03-13 DIAGNOSIS — M6281 Muscle weakness (generalized): Secondary | ICD-10-CM | POA: Diagnosis not present

## 2017-03-13 NOTE — Therapy (Signed)
[facility] [address] [place], [place], [address] Phone: [phone]   Fax:  [phone]  Physical Therapy Treatment  Patient Details  Name: [name] MRN: [id number] Date of Birth: [birth year] Referring Provider: Dr. Malon Kindle  Encounter Date: 03/13/2017      PT End of Session - 03/13/17 0930    Visit Number 27   Date for PT Re-Evaluation 03/30/17   Authorization Type UMR   PT Start Time 0925   PT Stop Time 1020   PT Time Calculation (min) 55 min   Activity Tolerance Patient tolerated treatment well   Behavior During Therapy [facility] for tasks assessed/performed      Past Medical History:  Diagnosis Date  . Allergy   . Asthma    pt stated treated as ashtma but not really asthma  . Bipolar disorder (HCC)   . Chicken pox   . Complication of anesthesia    oxygen saturation dropped after hysterectomy 2009 at [facility]  . Depression   . Diabetes mellitus   . Dyspnea   . GERD (gastroesophageal reflux disease)   . Hyperlipidemia   . Hypertension    readings  . Hypothyroidism   . Irregular heartbeat    saw dr Myrtis Ser sees cardiology as needed  . Migraine   . Sleep apnea     Past Surgical History:  Procedure Laterality Date  . ANKLE SURGERY  1989   left  . BACK SURGERY  1999  . CESAREAN SECTION     1995  . LAPAROSCOPIC ASSISTED VAGINAL HYSTERECTOMY  2009     BSO fibroids, DUB, pelvic pain  . 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 SHOULDER ARTHROSCOPY WITH OPEN ROTATOR CUFF REPAIR Right 01/02/2017   Procedure: Right shoulder arthroscopy, A-subcromial decompression, mini open rotator cuff repair, open distal clavicle resection, biceps tenodesis;  Surgeon: Beverely Low, MD;  Location: [facility] OR;  Service:  Orthopedics;  Laterality: Right;  . THYROIDECTOMY, PARTIAL  2001   removed left  . UMBILICAL HERNIA REPAIR  2003  . WISDOM TOOTH EXTRACTION      There were no vitals filed for this visit.      Subjective Assessment - 03/13/17 0931    Subjective No new complaints today, a little stiffness but no pain.    Currently in Pain? No/denies   Multiple Pain Sites No            [facility] PT Assessment - 03/13/17 0001      AROM   Overall AROM  --  3 1/2 diff bt Rt & LT behind the back   Right Shoulder Flexion 100 Degrees                     [facility] Adult PT Treatment/Exercise - 03/13/17 0001      Shoulder Exercises: Standing   Flexion AAROM;Strengthening;Right;10 reps;Weights   Shoulder Flexion Weight (lbs) 1.5  on flexion only,    Extension AROM;Strengthening;Right;20 reps;Theraband   Theraband Level (Shoulder Extension) Level 1 (Yellow)   Row Strengthening;Both;Theraband  3x10   Theraband Level (Shoulder Row) Level 3 (Green)  3x 10   Shoulder Elevation --  Cones (6) to second shelf 3x   Other Standing Exercises UE Ranger on #35 10x2 each  No asst needed by PTA, added 1 1/2 #  to wrist   Other Standing Exercises Rolling red ball up wall 2x10     Shoulder Exercises: Pulleys   Flexion 3 minutes   Other Pulley Exercises Standing IR behind theback   3x10 with PT assit     Shoulder Exercises: ROM/Strengthening   UBE (Upper Arm Bike) L0 3x3                  PT Short Term Goals - 02/02/17 1457      PT SHORT TERM GOAL #1   Title independent with initial HEP with her husband helping   Status Achieved     PT SHORT TERM GOAL #2   Title understand how to ice the shoulder   Status Achieved     PT SHORT TERM GOAL #3   Title pain in right shoulder decreased >/= 25%   Status Achieved           PT Long Term Goals - 03/02/17 0930      PT LONG TERM GOAL #1   Title Independent with HEP and how to progress herself   Time 8   Period Weeks   Status  On-going     PT LONG TERM GOAL #2   Title right shoulder AROM is WFL so she is able to dress herself without difficulty   Baseline not cleared for A/ROM and requires assistance with dressing   Time 8   Period Weeks   Status Achieved     PT LONG TERM GOAL #3   Title right shoulder strength is >/= 4/5 so she is able to perform her work tasks without increase in pain   Baseline not cleared for strengthening right now   Time 8   Period Weeks   Status On-going     PT LONG TERM GOAL #4   Title wean from sling and report 50% use of Rt UE as allowed by MD   Time 8   Period Weeks   Status Achieved     PT LONG TERM GOAL #5   Title able to lift light object like her purse due to right shoulder strength >/= 4/5 and no restrictions from the MD   Time 8   Period Weeks   Status On-going               Plan - 03/13/17 0930    Clinical Impression Statement Active flexion improving some as she gets stronger, reaching behind the back significantly better. Added some light resistance to the AAROM exercises today which pt tolerated very well, no pain during session as well as excelllent GH mechanics.    Rehab Potential Excellent   Clinical Impairments Affecting Rehab Potential No shoulder abduction; A/AAROM to right shoulder gently, scap stabilization   PT Frequency 3x / week   PT Duration 8 weeks   PT Treatment/Interventions Cryotherapy;Electrical Stimulation;Moist Heat;Ultrasound;Patient/family education;Neuromuscular re-education;Therapeutic exercise;Therapeutic activities;Taping;Vasopneumatic Device;Passive range of motion;Scar mobilization;Manual techniques   PT Next Visit Plan progress AROM, no abduction, gentle strengthening in functional manner.   Consulted and Agree with Plan of Care Patient      Patient will benefit from skilled therapeutic intervention in order to improve the following deficits and impairments:  Decreased activity tolerance, Decreased mobility, Decreased  strength, Increased edema, Decreased scar mobility, Impaired flexibility, Decreased endurance, Decreased coordination, Decreased range of motion, Increased fascial restricitons, Pain  Visit Diagnosis: Acute pain of right shoulder  Muscle weakness (generalized)  Stiffness of right shoulder, not elsewhere classified  Localized edema  Problem List Patient Active Problem List   Diagnosis Date Noted  . Morbid obesity with body mass index (BMI) of 50.0 to 59.9 in adult [facility]) 04/14/2016  . Type 2 diabetes mellitus without complications (HCC) 10/05/2015  . Hypersomnia with sleep apnea 06/20/2015  . Right lumbar radiculopathy 03/13/2014  . Occipital headache 01/03/2014  . 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 Benign paroxysmal positional vertigo 02/03/2013  . Ear pain 03/12/2012  . General medical examination 08/06/2011  . SINUS TACHYCARDIA 08/22/2008  . GERD 08/22/2008  . HYPERLIPIDEMIA, MIXED 05/25/2007  . OBESITY, MORBID 05/25/2007  . Bipolar disorder (HCC) 05/25/2007  . MIGRAINE, CHRONIC 05/25/2007  . Essential hypertension 05/25/2007  . Allergic rhinitis 05/25/2007  . Obstructive sleep apnea 05/17/2007    Davita Sublett, PTA 03/13/2017, 10:06 AM  [facility] [address], [address] [place], [place], [address] Phone: [phone]   Fax:  [phone]  Name: [name] MRN: [id number] Date of Birth: [birth year]

## 2017-03-16 ENCOUNTER — Encounter: Payer: Self-pay | Admitting: Physical Therapy

## 2017-03-16 ENCOUNTER — Ambulatory Visit: Payer: 59 | Admitting: Physical Therapy

## 2017-03-16 DIAGNOSIS — R600 Localized edema: Secondary | ICD-10-CM

## 2017-03-16 DIAGNOSIS — M6281 Muscle weakness (generalized): Secondary | ICD-10-CM | POA: Diagnosis not present

## 2017-03-16 DIAGNOSIS — M25611 Stiffness of right shoulder, not elsewhere classified: Secondary | ICD-10-CM

## 2017-03-16 DIAGNOSIS — M25511 Pain in right shoulder: Secondary | ICD-10-CM

## 2017-03-16 MED FILL — TEGRETOL XR 200 MG TABLET: 200 | 90 days supply | Qty: 270 | Fill #0

## 2017-03-16 MED FILL — ARIPiprazole 10 MG TABS: 10 | 90 days supply | Qty: 90 | Fill #0

## 2017-03-16 MED FILL — QVAR REDIHALER 80 MCG/ACT A: 80 | 30 days supply | Qty: 11 | Fill #1

## 2017-03-16 NOTE — Therapy (Signed)
[facility] [address], [address] [place], [place], [address] Phone: [phone]   Fax:  [phone]  Physical Therapy Treatment  Patient Details  Name: [name] MRN: [id number] Date of Birth: 10/18/1970 Referring Provider: Dr. Malon KindleSteven Norris  Encounter Date: 03/16/2017      PT End of Session - 03/16/17 0931    Visit Number 28   Date for PT Re-Evaluation 03/30/17   Authorization Type UMR   PT Start Time 0921   PT Stop Time 1020   PT Time Calculation (min) 59 min   Activity Tolerance Patient tolerated treatment well   Behavior [facility] Therapy [facility] for tasks assessed/performed      Past Medical History:  Diagnosis Date  . Allergy   . Asthma    pt stated treated as ashtma but not really asthma  . Bipolar disorder (HCC)   . Chicken pox   . Complication of anesthesia    [facility]  . Depression   . Diabetes mellitus   . Dyspnea   . GERD (gastroesophageal reflux disease)   . Hyperlipidemia   . Hypertension    readings  . Hypothyroidism   . Irregular heartbeat    saw dr Myrtis SerKatz sees cardiology as needed  . Migraine   . Sleep apnea     Past Surgical History:  Procedure Laterality Date  . ANKLE SURGERY  1989   left  . BACK SURGERY  1999  . CESAREAN SECTION     1995  . LAPAROSCOPIC ASSISTED VAGINAL HYSTERECTOMY  2009     BSO fibroids, DUB, pelvic pain  . 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 SHOULDER ARTHROSCOPY WITH OPEN ROTATOR CUFF REPAIR Right 01/02/2017   Procedure: Right shoulder arthroscopy, A-subcromial decompression, mini open rotator cuff repair, open distal clavicle resection, biceps tenodesis;  Surgeon: Beverely LowNorris, Steve, MD;  [facility];  Service:  Orthopedics;  Laterality: Right;  . THYROIDECTOMY, PARTIAL  2001   removed left  . UMBILICAL HERNIA REPAIR  2003  . WISDOM TOOTH EXTRACTION      There were no vitals filed for this visit.      Subjective Assessment - 03/16/17 0933    Subjective No new complaints   Currently in Pain? Yes   Pain Score 1    Pain Location Shoulder   Pain Orientation Right   Pain Descriptors / Indicators Dull   Aggravating Factors  seelping in my bed   Pain Relieving Factors heat ice   Multiple Pain Sites No                         [facility] Adult PT Treatment/Exercise - 03/16/17 0001      Shoulder Exercises: Sidelying   External Rotation AROM;Right   Theraband Level (Shoulder External Rotation) --  3x10 No asst today   External Rotation Limitations PTA took to end range , pt held and lowered eccenticallly     Shoulder Exercises: Standing   Flexion AAROM;Strengthening;Right;10 reps;Weights  finger ladder   Shoulder Flexion Weight (lbs) 1.5  on flexion only,    Extension AROM;Strengthening;Right;20 reps;Theraband   Theraband Level (Shoulder Extension) Level 1 (Yellow)   Row Strengthening;Both;Theraband  3x10   Theraband Level (Shoulder Row) Level 3 (Green)  3x 10  Shoulder Elevation --  Cones (6) to second shelf 3x   Other Standing Exercises UE Ranger on #35 10x2 each  No asst needed by PTA, added 1 1/2 # to wrist   Other Standing Exercises Rolling red ball up wall 2x10  Had pt hold arm off ball 2 sec then lower on ball     Shoulder Exercises: Pulleys   Flexion 3 minutes   Other Pulley Exercises Standing IR behind theback   3x10 with PT assit     Shoulder Exercises: ROM/Strengthening   UBE (Upper Arm Bike) L0 3x3     Moist Heat Therapy   Number Minutes Moist Heat 15 Minutes   Moist Heat Location Shoulder                  PT Short Term Goals - 02/02/17 1457      PT SHORT TERM GOAL #1   Title independent with initial HEP with her husband helping    Status Achieved     PT SHORT TERM GOAL #2   Title understand how to ice the shoulder   Status Achieved     PT SHORT TERM GOAL #3   Title pain in right shoulder decreased >/= 25%   Status Achieved           PT Long Term Goals - 03/02/17 0930      PT LONG TERM GOAL #1   Title Independent with HEP and how to progress herself   Time 8   Period Weeks   Status On-going     PT LONG TERM GOAL #2   Title right shoulder AROM is WFL so she is able to dress herself without difficulty   Baseline not cleared for A/ROM and requires assistance with dressing   Time 8   Period Weeks   Status Achieved     PT LONG TERM GOAL #3   Title right shoulder strength is >/= 4/5 so she is able to perform her work tasks without increase in pain   Baseline not cleared for strengthening right now   Time 8   Period Weeks   Status On-going     PT LONG TERM GOAL #4   Title wean from sling and report 50% use of Rt UE as allowed by MD   Time 8   Period Weeks   Status Achieved     PT LONG TERM GOAL #5   Title able to lift light object like her purse due to right shoulder strength >/= 4/5 and no restrictions from the MD   Time 8   Period Weeks   Status On-going               Plan - 03/16/17 9528    PT Next Visit Plan progress AROM, no abduction, gentle strengthening in functional manner.      Patient will benefit from skilled therapeutic intervention in order to improve the following deficits and impairments:  Decreased activity tolerance, Decreased mobility, Decreased strength, Increased edema, Decreased scar mobility, Impaired flexibility, Decreased endurance, Decreased coordination, Decreased range of motion, Increased fascial restricitons, Pain  Visit Diagnosis: Acute pain of right shoulder  Muscle weakness (generalized)  Stiffness of right shoulder, not elsewhere classified  Localized edema     Problem List Patient Active Problem List   Diagnosis Date Noted  . Morbid  obesity with body mass index (BMI) of 50.0 to 59.9 in adult [facility]) 04/14/2016  . Type 2 diabetes mellitus without complications (HCC) 10/05/2015  .  Hypersomnia with sleep apnea 06/20/2015  . Right lumbar radiculopathy 03/13/2014  . Occipital headache 01/03/2014  . 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 Benign paroxysmal positional vertigo 02/03/2013  . Ear pain 03/12/2012  . General medical examination 08/06/2011  . SINUS TACHYCARDIA 08/22/2008  . GERD 08/22/2008  . HYPERLIPIDEMIA, MIXED 05/25/2007  . OBESITY, MORBID 05/25/2007  . Bipolar disorder (HCC) 05/25/2007  . MIGRAINE, CHRONIC 05/25/2007  . Essential hypertension 05/25/2007  . Allergic rhinitis 05/25/2007  . Obstructive sleep apnea 05/17/2007    Quang Thorpe, PTA 03/16/2017, 10:00 AM  [facility] [address], [address] [place], [place], [address] Phone: [phone]   Fax:  [phone]  Name: [name] MRN: [id number] Date of Birth: March 29, 1971

## 2017-03-18 ENCOUNTER — Ambulatory Visit: Payer: 59 | Admitting: Physical Therapy

## 2017-03-18 ENCOUNTER — Encounter: Payer: Self-pay | Admitting: Physical Therapy

## 2017-03-18 DIAGNOSIS — M6281 Muscle weakness (generalized): Secondary | ICD-10-CM

## 2017-03-18 DIAGNOSIS — M25511 Pain in right shoulder: Secondary | ICD-10-CM

## 2017-03-18 DIAGNOSIS — R600 Localized edema: Secondary | ICD-10-CM

## 2017-03-18 DIAGNOSIS — M25611 Stiffness of right shoulder, not elsewhere classified: Secondary | ICD-10-CM

## 2017-03-18 NOTE — Therapy (Signed)
[facility] [address] [place], [place], [address] Phone: [phone]   Fax:  [phone]  Physical Therapy Treatment  Patient Details  Name: [name] MRN: [id number] Date of Birth: 22-Feb-1971 Referring Provider: Dr. Malon Kindle  Encounter Date: 03/18/2017      PT End of Session - 03/18/17 0924    Visit Number 29   Date for PT Re-Evaluation 03/30/17   Authorization Type UMR   PT Start Time 0924   PT Stop Time 1017   PT Time Calculation (min) 53 min   Activity Tolerance Patient tolerated treatment well   Behavior During Therapy [facility] for tasks assessed/performed      Past Medical History:  Diagnosis Date  . Allergy   . Asthma    pt stated treated as ashtma but not really asthma  . Bipolar disorder (HCC)   . Chicken pox   . Complication of anesthesia    oxygen saturation dropped after hysterectomy [facility] at [facility]  . Depression   . Diabetes mellitus   . Dyspnea   . GERD (gastroesophageal reflux disease)   . Hyperlipidemia   . Hypertension    readings  . Hypothyroidism   . Irregular heartbeat    saw dr Myrtis Ser sees cardiology as needed  . Migraine   . Sleep apnea     Past Surgical History:  Procedure Laterality Date  . ANKLE SURGERY  1989   left  . BACK SURGERY  1999  . CESAREAN SECTION     1995  . LAPAROSCOPIC ASSISTED VAGINAL HYSTERECTOMY  2009     BSO fibroids, DUB, pelvic pain  . 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 SHOULDER ARTHROSCOPY WITH OPEN ROTATOR CUFF REPAIR Right 01/02/2017   Procedure: Right shoulder arthroscopy, A-subcromial decompression, mini open rotator cuff repair, open distal clavicle resection, biceps tenodesis;  Surgeon: Beverely Low, MD;  Location: [facility] OR;  Service:  Orthopedics;  Laterality: Right;  . THYROIDECTOMY, PARTIAL  2001   removed left  . UMBILICAL HERNIA REPAIR  2003  . WISDOM TOOTH EXTRACTION      There were no vitals filed for this visit.      Subjective Assessment - 03/18/17 0925    Subjective No new complaints.   Currently in Pain? No/denies   Multiple Pain Sites No            [facility] PT Assessment - 03/18/17 0001      AROM   Right Shoulder Flexion 110 Degrees                     [facility] Adult PT Treatment/Exercise - 03/18/17 0001      Shoulder Exercises: Seated   Flexion AROM;Right;20 reps     Shoulder Exercises: Sidelying   External Rotation AROM;Right   Theraband Level (Shoulder External Rotation) --  3x10 No asst today   External Rotation Limitations PTA took to end range , pt held and lowered eccenticallly     Shoulder Exercises: Standing   Flexion AAROM;Strengthening;Right;10 reps;Weights  finger ladder   Shoulder Flexion Weight (lbs) 1.5  on flexion only,    Extension AROM;Strengthening;Right;20 reps;Theraband   Theraband Level (Shoulder Extension) Level 1 (Yellow)   Row Strengthening;Both;Theraband  3x10   Theraband Level (Shoulder Row) Level 3 (Green)  3x 10  Shoulder Elevation --  Cones (8) to second shelf 3x, added 1# to wrist     Shoulder Exercises: Pulleys   Flexion 3 minutes   Other Pulley Exercises Standing IR behind theback   3x10 with PT assit     Shoulder Exercises: ROM/Strengthening   UBE (Upper Arm Bike) L0 4x4     Moist Heat Therapy   Number Minutes Moist Heat 15 Minutes   Moist Heat Location Shoulder                  PT Short Term Goals - 02/02/17 1457      PT SHORT TERM GOAL #1   Title independent with initial HEP with her husband helping   Status Achieved     PT SHORT TERM GOAL #2   Title understand how to ice the shoulder   Status Achieved     PT SHORT TERM GOAL #3   Title pain in right shoulder decreased >/= 25%   Status Achieved            PT Long Term Goals - 03/02/17 0930      PT LONG TERM GOAL #1   Title Independent with HEP and how to progress herself   Time 8   Period Weeks   Status On-going     PT LONG TERM GOAL #2   Title right shoulder AROM is WFL so she is able to dress herself without difficulty   Baseline not cleared for A/ROM and requires assistance with dressing   Time 8   Period Weeks   Status Achieved     PT LONG TERM GOAL #3   Title right shoulder strength is >/= 4/5 so she is able to perform her work tasks without increase in pain   Baseline not cleared for strengthening right now   Time 8   Period Weeks   Status On-going     PT LONG TERM GOAL #4   Title wean from sling and report 50% use of Rt UE as allowed by MD   Time 8   Period Weeks   Status Achieved     PT LONG TERM GOAL #5   Title able to lift light object like her purse due to right shoulder strength >/= 4/5 and no restrictions from the MD   Time 8   Period Weeks   Status On-going               Plan - 03/18/17 [id number]    Clinical Impression Statement Pt increasing her AROM and therfore functional strength. At this point pt has not been cleared for PREs. AROM  measurements seen in chart.    Rehab Potential Excellent   Clinical Impairments Affecting Rehab Potential No shoulder abduction; A/AAROM to right shoulder gently, scap stabilization   PT Frequency 3x / week   PT Duration 8 weeks   PT Treatment/Interventions Cryotherapy;Electrical Stimulation;Moist Heat;Ultrasound;Patient/family education;Neuromuscular re-education;Therapeutic exercise;Therapeutic activities;Taping;Vasopneumatic Device;Passive range of motion;Scar mobilization;Manual techniques   PT Next Visit Plan progress AROM, no abduction, gentle strengthening in functional manner.   Consulted and Agree with Plan of Care Patient      Patient will benefit from skilled therapeutic intervention in order to improve the following deficits and impairments:     Visit  Diagnosis: Acute pain of right shoulder  Muscle weakness (generalized)  Stiffness of right shoulder, not elsewhere classified  Localized edema     Problem List Patient Active Problem List   Diagnosis Date Noted  . Morbid obesity  with body mass index (BMI) of 50.0 to 59.9 in adult [facility]) 04/14/2016  . Type 2 diabetes mellitus without complications (HCC) 10/05/2015  . Hypersomnia with sleep apnea 06/20/2015  . Right lumbar radiculopathy 03/13/2014  . Occipital headache 01/03/2014  . 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 Benign paroxysmal positional vertigo 02/03/2013  . Ear pain 03/12/2012  . General medical examination 08/06/2011  . SINUS TACHYCARDIA 08/22/2008  . GERD 08/22/2008  . HYPERLIPIDEMIA, MIXED 05/25/2007  . OBESITY, MORBID 05/25/2007  . Bipolar disorder (HCC) 05/25/2007  . MIGRAINE, CHRONIC 05/25/2007  . Essential hypertension 05/25/2007  . Allergic rhinitis 05/25/2007  . Obstructive sleep apnea 05/17/2007    Lennin Osmond, PTA 03/18/2017, 10:02 AM  [facility] [address], [address] [place], [place], [address] Phone: [phone]   Fax:  [phone]  Name: [name] MRN: [id number] Date of Birth: 1971/04/15

## 2017-03-20 ENCOUNTER — Ambulatory Visit: Payer: 59 | Admitting: Physical Therapy

## 2017-03-20 ENCOUNTER — Encounter: Payer: Self-pay | Admitting: Physical Therapy

## 2017-03-20 DIAGNOSIS — M25511 Pain in right shoulder: Secondary | ICD-10-CM

## 2017-03-20 DIAGNOSIS — M25611 Stiffness of right shoulder, not elsewhere classified: Secondary | ICD-10-CM | POA: Diagnosis not present

## 2017-03-20 DIAGNOSIS — R600 Localized edema: Secondary | ICD-10-CM | POA: Diagnosis not present

## 2017-03-20 DIAGNOSIS — M6281 Muscle weakness (generalized): Secondary | ICD-10-CM | POA: Diagnosis not present

## 2017-03-20 NOTE — Therapy (Signed)
[facility] [address], [address] [place], [place], [address] Phone: [phone]   Fax:  [phone]  Physical Therapy Treatment  Patient Details  Name: [name] MRN: [id number] Date of Birth: 12-28-1970 Referring Provider: Dr. Malon Kindle  Encounter Date: 03/20/2017      PT End of Session - 03/20/17 0931    Date for PT Re-Evaluation 03/30/17   Authorization Type UMR   PT Start Time 0919   PT Stop Time 1014  15 min heat   PT Time Calculation (min) 55 min   Activity Tolerance Patient tolerated treatment well   Behavior During Therapy [facility] for tasks assessed/performed      Past Medical History:  Diagnosis Date  . Allergy   . Asthma    pt stated treated as ashtma but not really asthma  . Bipolar disorder (HCC)   . Chicken pox   . Complication of anesthesia    oxygen saturation dropped after [facility]  . Depression   . Diabetes mellitus   . Dyspnea   . GERD (gastroesophageal reflux disease)   . Hyperlipidemia   . Hypertension    readings  . Hypothyroidism   . Irregular heartbeat    saw dr Myrtis Ser sees cardiology as needed  . Migraine   . Sleep apnea     Past Surgical History:  Procedure Laterality Date  . ANKLE SURGERY  1989   left  . BACK SURGERY  1999  . CESAREAN SECTION     1995  . LAPAROSCOPIC ASSISTED VAGINAL HYSTERECTOMY  2009     BSO fibroids, DUB, pelvic pain  . 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 SHOULDER ARTHROSCOPY WITH OPEN ROTATOR CUFF REPAIR Right 01/02/2017   Procedure: Right shoulder arthroscopy, A-subcromial decompression, mini open rotator cuff repair, open distal clavicle resection, biceps tenodesis;  Surgeon: Beverely Low, MD;  Location: [facility] OR;  Service:  Orthopedics;  Laterality: Right;  . THYROIDECTOMY, PARTIAL  2001   removed left  . UMBILICAL HERNIA REPAIR  2003  . WISDOM TOOTH EXTRACTION      There were no vitals filed for this visit.      Subjective Assessment - 03/20/17 0935    Subjective Patient states she is feeling good.  It is raining and feeling a little achy this morning   Pertinent History MD f/u 10/30   Patient Stated Goals reduce pain and improve mobility and strength   Currently in Pain? Yes   Pain Score 1    Pain Location Shoulder   Pain Orientation Right   Pain Descriptors / Indicators Aching   Pain Type Surgical pain   Pain Onset More than a month ago   Pain Frequency Intermittent   Aggravating Factors  working, sleeping in my bed   Pain Relieving Factors heat, ice   Multiple Pain Sites No                         [facility] Adult PT Treatment/Exercise - 03/20/17 0001      Shoulder Exercises: Standing   Flexion AAROM;Strengthening;Right;10 reps;Weights  finger ladder   Shoulder Flexion Weight (lbs) 1.5  on flexion only,    Extension AROM;Strengthening;Right;20 reps;Theraband   Theraband Level (Shoulder Extension) Level 1 (Yellow)   Row Strengthening;Both;Theraband  3x10  Theraband Level (Shoulder Row) Level 3 (Green)  3x 10   Shoulder Elevation --  Cones (8) to second shelf 3x, added 1.5# to wrist   Other Standing Exercises UE Ranger on #35 10x2 each  No asst needed by PTA, added 1 1/2 # to wrist   Other Standing Exercises Rolling red ball up wall 2x10  Had pt hold arm off ball 2 sec then lower on ball     Shoulder Exercises: Pulleys   Flexion 3 minutes   Other Pulley Exercises Standing IR behind theback   3x10 with PT assit     Shoulder Exercises: ROM/Strengthening   UBE (Upper Arm Bike) L0 4x4     Moist Heat Therapy   Number Minutes Moist Heat 15 Minutes   Moist Heat Location Shoulder                  PT Short Term Goals - 02/02/17 1457      PT SHORT TERM GOAL  #1   Title independent with initial HEP with her husband helping   Status Achieved     PT SHORT TERM GOAL #2   Title understand how to ice the shoulder   Status Achieved     PT SHORT TERM GOAL #3   Title pain in right shoulder decreased >/= 25%   Status Achieved           PT Long Term Goals - 03/02/17 0930      PT LONG TERM GOAL #1   Title Independent with HEP and how to progress herself   Time 8   Period Weeks   Status On-going     PT LONG TERM GOAL #2   Title right shoulder AROM is WFL so she is able to dress herself without difficulty   Baseline not cleared for A/ROM and requires assistance with dressing   Time 8   Period Weeks   Status Achieved     PT LONG TERM GOAL #3   Title right shoulder strength is >/= 4/5 so she is able to perform her work tasks without increase in pain   Baseline not cleared for strengthening right now   Time 8   Period Weeks   Status On-going     PT LONG TERM GOAL #4   Title wean from sling and report 50% use of Rt UE as allowed by MD   Time 8   Period Weeks   Status Achieved     PT LONG TERM GOAL #5   Title able to lift light object like her purse due to right shoulder strength >/= 4/5 and no restrictions from the MD   Time 8   Period Weeks   Status On-going               Plan - 03/20/17 0941    Clinical Impression Statement Pt is still showing improvements with ROM and strength.  Able to increase resistence with 1.5# with AROM flexion today.  She is still limited with precautions.  She is benefitting from skilled PT to work on strength and increased ROM.   Rehab Potential Excellent   Clinical Impairments Affecting Rehab Potential No shoulder abduction; A/AAROM to right shoulder gently, scap stabilization   PT Treatment/Interventions Cryotherapy;Electrical Stimulation;Moist Heat;Ultrasound;Patient/family education;Neuromuscular re-education;Therapeutic exercise;Therapeutic activities;Taping;Vasopneumatic Device;Passive  range of motion;Scar mobilization;Manual techniques   PT Next Visit Plan progress AROM, no abduction, gentle strengthening in functional manner.   Consulted and Agree with Plan of Care Patient  Patient will benefit from skilled therapeutic intervention in order to improve the following deficits and impairments:  Decreased activity tolerance, Decreased mobility, Decreased strength, Increased edema, Decreased scar mobility, Impaired flexibility, Decreased endurance, Decreased coordination, Decreased range of motion, Increased fascial restricitons, Pain  Visit Diagnosis: Acute pain of right shoulder  Muscle weakness (generalized)  Stiffness of right shoulder, not elsewhere classified  Localized edema     Problem List Patient Active Problem List   Diagnosis Date Noted  . Morbid obesity with body mass index (BMI) of 50.0 to 59.9 in adult [facility](HCC) 04/14/2016  . Type 2 diabetes mellitus without complications (HCC) 10/05/2015  . Hypersomnia with sleep apnea 06/20/2015  . Right lumbar radiculopathy 03/13/2014  . Occipital headache 01/03/2014  . 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 Benign paroxysmal positional vertigo 02/03/2013  . Ear pain 03/12/2012  . General medical examination 08/06/2011  . SINUS TACHYCARDIA 08/22/2008  . GERD 08/22/2008  . HYPERLIPIDEMIA, MIXED 05/25/2007  . OBESITY, MORBID 05/25/2007  . Bipolar disorder (HCC) 05/25/2007  . MIGRAINE, CHRONIC 05/25/2007  . Essential hypertension 05/25/2007  . Allergic rhinitis 05/25/2007  . Obstructive sleep apnea 05/17/2007    Vincente PoliJakki Crosser, PT 03/20/2017, 10:20 AM  [facility] [address] [place], [place], [address] Phone: [phone]   Fax:  [phone]  Name: Sindy Guadeloupeammy Gaymon MRN: [id number] Date of Birth: 12-16-1970

## 2017-03-23 ENCOUNTER — Ambulatory Visit: Payer: 59 | Admitting: Physical Therapy

## 2017-03-23 ENCOUNTER — Encounter: Payer: Self-pay | Admitting: Physical Therapy

## 2017-03-23 DIAGNOSIS — R600 Localized edema: Secondary | ICD-10-CM | POA: Diagnosis not present

## 2017-03-23 DIAGNOSIS — M25611 Stiffness of right shoulder, not elsewhere classified: Secondary | ICD-10-CM | POA: Diagnosis not present

## 2017-03-23 DIAGNOSIS — M6281 Muscle weakness (generalized): Secondary | ICD-10-CM | POA: Diagnosis not present

## 2017-03-23 DIAGNOSIS — M25511 Pain in right shoulder: Secondary | ICD-10-CM | POA: Diagnosis not present

## 2017-03-23 NOTE — Therapy (Signed)
[facility] [address], [address] [place], [place], [address] Phone: [phone]   Fax:  [phone]  Physical Therapy Treatment  Patient Details  Name: [name] MRN: [id number] Date of Birth: 1971-02-05 Referring Provider: Dr. Malon KindleSteven Norris  Encounter Date: 03/23/2017      PT End of Session - 03/23/17 0933    Visit Number 30   Date for PT Re-Evaluation 03/30/17   Authorization Type UMR   PT Start Time 0925   PT Stop Time 1015   PT Time Calculation (min) 50 min   Activity Tolerance Patient tolerated treatment well   Behavior [facility] for tasks assessed/performed      Past Medical History:  Diagnosis Date  . Allergy   . Asthma    pt stated treated as ashtma but not really asthma  . Bipolar disorder (HCC)   . Chicken pox   . Complication of anesthesia    [facility]  . Depression   . Diabetes mellitus   . Dyspnea   . GERD (gastroesophageal reflux disease)   . Hyperlipidemia   . Hypertension    readings  . Hypothyroidism   . Irregular heartbeat    saw dr Myrtis SerKatz sees cardiology as needed  . Migraine   . Sleep apnea     Past Surgical History:  Procedure Laterality Date  . ANKLE SURGERY  1989   left  . BACK SURGERY  1999  . CESAREAN SECTION     1995  . LAPAROSCOPIC ASSISTED VAGINAL HYSTERECTOMY  2009     BSO fibroids, DUB, pelvic pain  . 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 SHOULDER ARTHROSCOPY WITH OPEN ROTATOR CUFF REPAIR Right 01/02/2017   Procedure: Right shoulder arthroscopy, A-subcromial decompression, mini open rotator cuff repair, open distal clavicle resection, biceps tenodesis;  Surgeon: Beverely LowNorris, Steve, MD;  [facility];  Service:  Orthopedics;  Laterality: Right;  . THYROIDECTOMY, PARTIAL  2001   removed left  . UMBILICAL HERNIA REPAIR  2003  . WISDOM TOOTH EXTRACTION      There were no vitals filed for this visit.      Subjective Assessment - 03/23/17 0934    Subjective Sore from making trick or treat bags over the weekend. No pain. To Md tomorrow.    Currently in Pain? Yes   Pain Score 2    Pain Location Shoulder   Pain Orientation Right   Pain Descriptors / Indicators Sore  muscles/joint   Aggravating Factors  overdoing it   Pain Relieving Factors heat   Multiple Pain Sites No            [facility] PT Assessment - 03/23/17 0001      Observation/Other Assessments   Focus on Therapeutic Outcomes (FOTO)  57% limited      AROM   Overall AROM  --  Behind back reaching to uppewr lumbar/lower thoracic   Right Shoulder Flexion 120 Degrees                     [facility] Adult PT Treatment/Exercise - 03/23/17 0001      Shoulder Exercises: Seated   Flexion AROM;Right;20 reps     Shoulder Exercises: Sidelying   External Rotation AROM;Right  3x10     Shoulder  Exercises: Standing   Flexion AAROM;Strengthening;Right;10 reps;Weights  finger ladder   Shoulder Flexion Weight (lbs) 1.5  on flexion only,    ABduction --  scaption 10x on ladder 1.5 # on wrist   Extension AROM;Strengthening;Right;20 reps;Theraband   Theraband Level (Shoulder Extension) Level 1 (Yellow)   Row Strengthening;Both;Theraband  3x10   Theraband Level (Shoulder Row) Level 3 (Green)  3x 10   Shoulder Elevation --  Cones (8) to second shelf 3x, added 1.5# to wrist   Other Standing Exercises UE Ranger on #35 10x3 each  No asst needed by PTA, added 1 1/2 # to wrist   Other Standing Exercises Rolling red ball up wall 2x10  Had pt hold arm off ball 2 sec then lower on ball     Shoulder Exercises: Pulleys   Flexion 3 minutes   Other Pulley Exercises Standing IR behind theback   3x10 with PT assit     Shoulder  Exercises: ROM/Strengthening   UBE (Upper Arm Bike) L0 4x4     Moist Heat Therapy   Number Minutes Moist Heat 10 Minutes   Moist Heat Location Shoulder                  PT Short Term Goals - 02/02/17 1457      PT SHORT TERM GOAL #1   Title independent with initial HEP with her husband helping   Status Achieved     PT SHORT TERM GOAL #2   Title understand how to ice the shoulder   Status Achieved     PT SHORT TERM GOAL #3   Title pain in right shoulder decreased >/= 25%   Status Achieved           PT Long Term Goals - 03/23/17 0945      PT LONG TERM GOAL #5   Title able to lift light object like her purse due to right shoulder strength >/= 4/5 and no restrictions from the MD   Time 8   Period Weeks   Status On-going  Not lifting anything per MD     PT LONG TERM GOAL #6   Title FOTO score </= 54% limitation   Time 8   Period Weeks   Status On-going  57%               Plan - 03/23/17 0933    Clinical Impression Statement Pt is participating in most of her daily activities. Lifting arm over 100 degrees is still challening. Currently still under precautions.    Rehab Potential Excellent   Clinical Impairments Affecting Rehab Potential No shoulder abduction; A/AAROM to right shoulder gently, scap stabilization   PT Frequency 3x / week   PT Duration 8 weeks   PT Treatment/Interventions Cryotherapy;Electrical Stimulation;Moist Heat;Ultrasound;Patient/family education;Neuromuscular re-education;Therapeutic exercise;Therapeutic activities;Taping;Vasopneumatic Device;Passive range of motion;Scar mobilization;Manual techniques   PT Next Visit Plan To MD. See if pt is ready for PREs/strengthening to RTC.   Consulted and Agree with Plan of Care --      Patient will benefit from skilled therapeutic intervention in order to improve the following deficits and impairments:  Decreased activity tolerance, Decreased mobility, Decreased strength, Increased edema,  Decreased scar mobility, Impaired flexibility, Decreased endurance, Decreased coordination, Decreased range of motion, Increased fascial restricitons, Pain  Visit Diagnosis: Acute pain of right shoulder  Muscle weakness (generalized)  Stiffness of right shoulder, not elsewhere classified  Localized edema     Problem List Patient Active Problem List   Diagnosis Date Noted  .  Morbid obesity with body mass index (BMI) of 50.0 to 59.9 in adult [facility]) 04/14/2016  . Type 2 diabetes mellitus without complications (HCC) 10/05/2015  . Hypersomnia with sleep apnea 06/20/2015  . Right lumbar radiculopathy 03/13/2014  . Occipital headache 01/03/2014  . 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 Benign paroxysmal positional vertigo 02/03/2013  . Ear pain 03/12/2012  . General medical examination 08/06/2011  . SINUS TACHYCARDIA 08/22/2008  . GERD 08/22/2008  . HYPERLIPIDEMIA, MIXED 05/25/2007  . OBESITY, MORBID 05/25/2007  . Bipolar disorder (HCC) 05/25/2007  . MIGRAINE, CHRONIC 05/25/2007  . Essential hypertension 05/25/2007  . Allergic rhinitis 05/25/2007  . Obstructive sleep apnea 05/17/2007    Theron Cumbie, PTA 03/23/2017, 10:06 AM  [facility] [address], [address] [place], [place], [address] Phone: [phone]   Fax:  [phone]  Name: [name] MRN: [id number] Date of Birth: 05/31/1970

## 2017-03-25 ENCOUNTER — Encounter: Payer: Self-pay | Admitting: Physical Therapy

## 2017-03-25 ENCOUNTER — Ambulatory Visit: Payer: 59 | Admitting: Physical Therapy

## 2017-03-25 DIAGNOSIS — R600 Localized edema: Secondary | ICD-10-CM | POA: Diagnosis not present

## 2017-03-25 DIAGNOSIS — M6281 Muscle weakness (generalized): Secondary | ICD-10-CM | POA: Diagnosis not present

## 2017-03-25 DIAGNOSIS — M25511 Pain in right shoulder: Secondary | ICD-10-CM | POA: Diagnosis not present

## 2017-03-25 DIAGNOSIS — M25611 Stiffness of right shoulder, not elsewhere classified: Secondary | ICD-10-CM

## 2017-03-25 NOTE — Patient Instructions (Signed)
Strengthening: Resisted Internal Rotation   Hold tubing in left hand, elbow at side and forearm out. Rotate forearm in across body. Repeat ____ times per set. Do ____ sets per session. Do ____ sessions per day.  http://orth.exer.us/830   Copyright  VHI. All rights reserved.  Strengthening: Resisted External Rotation   Hold tubing in right hand, elbow at side and forearm across body. Rotate forearm out., keeping more of a 90 degree angle at your elbow. Repeat _10___ times per set. Do _1-2___ sets per session. Do __1__ sessions per day.  http://orth.exer.us/828   Copyright  VHI. All rights reserved.  Strengthening: Resisted Flexion   Hold tubing with left arm at side. Bend elbow/punch forward and back. So your elbow bends and straightens. . Move shoulder through pain-free range of motion. Repeat ____ times per set. Do ____ sets per session. Do ____ sessions per day.  http://orth.exer.us/824   Copyright  VHI. All rights reserved.  Strengthening: Resisted Extension   Hold tubing in right hand, arm forward. Pull arm back, elbow straight. Repeat __10__ times per set. Do __2__ sets per session. Do _1-2___ sessions per day.  Ok to use green band for the pull backs, bending the elbow.   http://orth.exer.us/832   Copyright  VHI. All rights reserved.

## 2017-03-25 NOTE — Therapy (Signed)
[facility] [address], [address] [place], [place], [address] Phone: [phone]   Fax:  [phone]  Physical Therapy Treatment  Patient Details  Name: [name] MRN: [id number] Date of Birth: [birth year] Referring Provider: Dr. Malon Kindle  Encounter Date: 03/25/2017      PT End of Session - 03/25/17 0927    Visit Number 31   Date for PT Re-Evaluation 03/30/17   Authorization Type UMR   PT Start Time 0921   PT Stop Time 1025   PT Time Calculation (min) 64 min   Activity Tolerance Patient tolerated treatment well   Behavior During Therapy [facility] for tasks assessed/performed      Past Medical History:  Diagnosis Date  . Allergy   . Asthma    pt stated treated as ashtma but not really asthma  . Bipolar disorder (HCC)   . Chicken pox   . Complication of anesthesia    oxygen saturation dropped after hysterectomy 2009 at [facility]  . Depression   . Diabetes mellitus   . Dyspnea   . GERD (gastroesophageal reflux disease)   . Hyperlipidemia   . Hypertension    readings  . Hypothyroidism   . Irregular heartbeat    saw dr Myrtis Ser sees cardiology as needed  . Migraine   . Sleep apnea     Past Surgical History:  Procedure Laterality Date  . ANKLE SURGERY  1989   left  . BACK SURGERY  1999  . CESAREAN SECTION     1995  . LAPAROSCOPIC ASSISTED VAGINAL HYSTERECTOMY  2009     BSO fibroids, DUB, pelvic pain  . 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 SHOULDER ARTHROSCOPY WITH OPEN ROTATOR CUFF REPAIR Right 01/02/2017   Procedure: Right shoulder arthroscopy, A-subcromial decompression, mini open rotator cuff repair, open distal clavicle resection, biceps tenodesis;  Surgeon: Beverely Low, MD;  Location: [facility] OR;  Service:  Orthopedics;  Laterality: Right;  . THYROIDECTOMY, PARTIAL  2001   removed left  . UMBILICAL HERNIA REPAIR  2003  . WISDOM TOOTH EXTRACTION      There were no vitals filed for this visit.      Subjective Assessment - 03/25/17 0929    Subjective MD super pleased with progress. Pt is cleared to strengthen and abduction is ok.    Currently in Pain? Yes   Pain Score 2    Pain Location Shoulder   Pain Orientation Right   Pain Descriptors / Indicators Sore   Multiple Pain Sites No                         [facility] Adult PT Treatment/Exercise - 03/25/17 0001      Shoulder Exercises: Sidelying   External Rotation AROM;Right  2x 10 1# added   ABduction AAROM;Right;10 reps  PTA assist mainly with lowering     Shoulder Exercises: Standing   Flexion AAROM;Strengthening;Right;10 reps;Weights  finger ladder   Shoulder Flexion Weight (lbs) 1.5   ABduction AAROM;Right;10 reps   Shoulder Elevation --  1# to first shelf 2x 10   Other Standing Exercises UE Ranger on #35 20x, then scaption 0# 10x   No asst needed by PTA, added 1 1/2 # to wrist   Other Standing Exercises [place] yellow 10x each  Shoulder Exercises: Pulleys   Flexion 3 minutes   ABduction 2 minutes     Shoulder Exercises: ROM/Strengthening   UBE (Upper Arm Bike) L0 4x4     Moist Heat Therapy   Number Minutes Moist Heat 10 Minutes   Moist Heat Location Shoulder                PT Education - 03/25/17 0938    Education provided Yes   Education Details [place] ye;llow for HEP   Person(s) Educated Patient   Methods Explanation;Demonstration;Tactile cues;Verbal cues;Handout   Comprehension Verbalized understanding;Returned demonstration          PT Short Term Goals - 02/02/17 1457      PT SHORT TERM GOAL #1   Title independent with initial HEP with her husband helping   Status Achieved     PT SHORT TERM GOAL #2   Title understand how to ice the shoulder   Status Achieved     PT  SHORT TERM GOAL #3   Title pain in right shoulder decreased >/= 25%   Status Achieved           PT Long Term Goals - 03/23/17 0945      PT LONG TERM GOAL #5   Title able to lift light object like her purse due to right shoulder strength >/= 4/5 and no restrictions from the MD   Time 8   Period Weeks   Status On-going  Not lifting anything per MD     PT LONG TERM GOAL #6   Title FOTO score </= 54% limitation   Time 8   Period Weeks   Status On-going  57%               Plan - 03/25/17 0927    Clinical Impression Statement Pt saw MD. He was pleased with pt's progress. She is now ok to abduct and progress to more formal strengthening.  Progressed HEP to address new motions and strength.    Rehab Potential Excellent   Clinical Impairments Affecting Rehab Potential Abduction ok, ok to strenghen and increase function.    PT Frequency 3x / week   PT Duration 8 weeks   PT Treatment/Interventions Cryotherapy;Electrical Stimulation;Moist Heat;Ultrasound;Patient/family education;Neuromuscular re-education;Therapeutic exercise;Therapeutic activities;Taping;Vasopneumatic Device;Passive range of motion;Scar mobilization;Manual techniques   PT Next Visit Plan Ok for abduction and to begin formal strengthening.    Consulted and Agree with Plan of Care Patient      Patient will benefit from skilled therapeutic intervention in order to improve the following deficits and impairments:  Decreased activity tolerance, Decreased mobility, Decreased strength, Increased edema, Decreased scar mobility, Impaired flexibility, Decreased endurance, Decreased coordination, Decreased range of motion, Increased fascial restricitons, Pain  Visit Diagnosis: Acute pain of right shoulder  Muscle weakness (generalized)  Stiffness of right shoulder, not elsewhere classified     Problem List Patient Active Problem List   Diagnosis Date Noted  . Morbid obesity with body mass index (BMI) of 50.0 to  59.9 in adult [facility]) 04/14/2016  . Type 2 diabetes mellitus without complications (HCC) 10/05/2015  . Hypersomnia with sleep apnea 06/20/2015  . Right lumbar radiculopathy 03/13/2014  . Occipital headache 01/03/2014  . 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 Benign paroxysmal positional vertigo 02/03/2013  . Ear pain 03/12/2012  . General medical examination 08/06/2011  . SINUS TACHYCARDIA 08/22/2008  . GERD 08/22/2008  . HYPERLIPIDEMIA, MIXED 05/25/2007  . OBESITY, MORBID 05/25/2007  . Bipolar disorder (HCC) 05/25/2007  . MIGRAINE, CHRONIC  05/25/2007  . Essential hypertension 05/25/2007  . Allergic rhinitis 05/25/2007  . Obstructive sleep apnea 05/17/2007    Morgen Linebaugh, PTA 03/25/2017, 10:15 AM  [facility] [address], [address] [place], [place], [address] Phone: [phone]   Fax:  [phone]  Name: [name] MRN: [id number] Date of Birth: [birth year]

## 2017-03-27 ENCOUNTER — Encounter: Payer: Self-pay | Admitting: Physical Therapy

## 2017-03-27 ENCOUNTER — Ambulatory Visit: Payer: 59 | Attending: Orthopedic Surgery | Admitting: Physical Therapy

## 2017-03-27 DIAGNOSIS — R600 Localized edema: Secondary | ICD-10-CM | POA: Insufficient documentation

## 2017-03-27 DIAGNOSIS — M6281 Muscle weakness (generalized): Secondary | ICD-10-CM | POA: Insufficient documentation

## 2017-03-27 DIAGNOSIS — M25511 Pain in right shoulder: Secondary | ICD-10-CM | POA: Diagnosis not present

## 2017-03-27 DIAGNOSIS — M25611 Stiffness of right shoulder, not elsewhere classified: Secondary | ICD-10-CM | POA: Diagnosis not present

## 2017-03-27 NOTE — Therapy (Signed)
[facility] [address] [place], [place], [address] Phone: [phone]   Fax:  [phone]  Physical Therapy Treatment  Patient Details  Name: [name] MRN: [id number] Date of Birth: [birth year] Referring Provider: Dr. Malon Kindle  Encounter Date: 03/27/2017      PT End of Session - 03/27/17 0930    Visit Number 32   Date for PT Re-Evaluation 03/30/17   Authorization Type UMR   PT Start Time 0924   PT Stop Time 1017  10 min moist heat   PT Time Calculation (min) 53 min   Activity Tolerance Patient tolerated treatment well   Behavior During Therapy [facility] for tasks assessed/performed      Past Medical History:  Diagnosis Date  . Allergy   . Asthma    pt stated treated as ashtma but not really asthma  . Bipolar disorder (HCC)   . Chicken pox   . Complication of anesthesia    oxygen saturation dropped after hysterectomy 2009 at [facility]  . Depression   . Diabetes mellitus   . Dyspnea   . GERD (gastroesophageal reflux disease)   . Hyperlipidemia   . Hypertension    readings  . Hypothyroidism   . Irregular heartbeat    saw dr Myrtis Ser sees cardiology as needed  . Migraine   . Sleep apnea     Past Surgical History:  Procedure Laterality Date  . ANKLE SURGERY  1989   left  . BACK SURGERY  1999  . CESAREAN SECTION     1995  . LAPAROSCOPIC ASSISTED VAGINAL HYSTERECTOMY  2009     BSO fibroids, DUB, pelvic pain  . 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 SHOULDER ARTHROSCOPY WITH OPEN ROTATOR CUFF REPAIR Right 01/02/2017   Procedure: Right shoulder arthroscopy, A-subcromial decompression, mini open rotator cuff repair, open distal clavicle resection, biceps tenodesis;  Surgeon: Beverely Low, MD;  Location: [facility]  OR;  Service: Orthopedics;  Laterality: Right;  . THYROIDECTOMY, PARTIAL  2001   removed left  . UMBILICAL HERNIA REPAIR  2003  . WISDOM TOOTH EXTRACTION      There were no vitals filed for this visit.      Subjective Assessment - 03/27/17 0944    Subjective I was more sore after adding the abduction   Patient Stated Goals reduce pain and improve mobility and strength   Currently in Pain? Yes   Pain Score 2    Pain Location Shoulder   Pain Orientation Right   Pain Descriptors / Indicators Sore                         [facility] Adult PT Treatment/Exercise - 03/27/17 0001      Shoulder Exercises: Sidelying   External Rotation AROM;Right  2x 10 1# added   ABduction AAROM;Right;10 reps  PTA assist mainly with lowering     Shoulder Exercises: Standing   Flexion AAROM;Strengthening;Right;10 reps;Weights  finger ladder   Shoulder Flexion Weight (lbs) 1.5   ABduction AAROM;Right;10 reps   Row Strengthening;Both;Theraband  3x10   Theraband Level (Shoulder Row) Level 3 (Green)  3x 10   Other Standing Exercises UE Ranger on #35 20x, then scaption 0# 10x   No asst needed by PTA, added 1 1/2 # to wrist  Other Standing Exercises [place] yellow 10x each     Shoulder Exercises: Pulleys   Flexion 3 minutes   ABduction 3 minutes   Other Pulley Exercises seated ER AAROM on table     Shoulder Exercises: ROM/Strengthening   UBE (Upper Arm Bike) L0 4x4     Moist Heat Therapy   Number Minutes Moist Heat 10 Minutes                  PT Short Term Goals - 02/02/17 1457      PT SHORT TERM GOAL #1   Title independent with initial HEP with her husband helping   Status Achieved     PT SHORT TERM GOAL #2   Title understand how to ice the shoulder   Status Achieved     PT SHORT TERM GOAL #3   Title pain in right shoulder decreased >/= 25%   Status Achieved           PT Long Term Goals - 03/27/17 1012      PT LONG TERM GOAL #1   Title Independent  with HEP and how to progress herself   Time 8   Period Weeks     PT LONG TERM GOAL #2   Title right shoulder AROM is WFL so she is able to dress herself without difficulty   Time 8   Period Weeks   Status Achieved     PT LONG TERM GOAL #3   Title right shoulder strength is >/= 4/5 so she is able to perform her work tasks without increase in pain   Time 8   Period Weeks   Status On-going     PT LONG TERM GOAL #4   Title wean from sling and report 50% use of Rt UE as allowed by MD   Time 8   Period Weeks   Status Achieved     PT LONG TERM GOAL #5   Title able to lift light object like her purse due to right shoulder strength >/= 4/5 and no restrictions from the MD   Baseline still with restriction from MD and not able to move Rt UE against gravity independently   Time 8   Period Weeks   Status On-going     PT LONG TERM GOAL #6   Title FOTO score </= 54% limitation   Baseline 57% on 10/29   Time 8   Period Weeks   Status On-going               Plan - 03/27/17 1008    Clinical Impression Statement Patient is now progress with increased abduction ROM with 95 deg PROM pain free.  Pt is able to progress with addition of resistance.  She is still not able to perform horizontal abduction due to restrictions from MD .  She will continue to need skilled PT to achieve functional goals and safely progress exercises within precautions   Rehab Potential Excellent   Clinical Impairments Affecting Rehab Potential Abduction ok, ok to strenghen and increase function, no horizontal abduction   PT Treatment/Interventions Cryotherapy;Electrical Stimulation;Moist Heat;Ultrasound;Patient/family education;Neuromuscular re-education;Therapeutic exercise;Therapeutic activities;Taping;Vasopneumatic Device;Passive range of motion;Scar mobilization;Manual techniques   PT Next Visit Plan strengthening shoulder and RTC progressing slowly, no horizontal abduction per MD precautions verbalized by  patient   PT Home Exercise Plan progress as needed   Consulted and Agree with Plan of Care Patient      Patient will benefit from skilled therapeutic intervention in order to  improve the following deficits and impairments:  Decreased activity tolerance, Decreased mobility, Decreased strength, Increased edema, Decreased scar mobility, Impaired flexibility, Decreased endurance, Decreased coordination, Decreased range of motion, Increased fascial restricitons, Pain  Visit Diagnosis: Acute pain of right shoulder  Muscle weakness (generalized)  Stiffness of right shoulder, not elsewhere classified  Localized edema     Problem List Patient Active Problem List   Diagnosis Date Noted  . Morbid obesity with body mass index (BMI) of 50.0 to 59.9 in adult [facility]) 04/14/2016  . Type 2 diabetes mellitus without complications (HCC) 10/05/2015  . Hypersomnia with sleep apnea 06/20/2015  . Right lumbar radiculopathy 03/13/2014  . Occipital headache 01/03/2014  . 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 Benign paroxysmal positional vertigo 02/03/2013  . Ear pain 03/12/2012  . General medical examination 08/06/2011  . SINUS TACHYCARDIA 08/22/2008  . GERD 08/22/2008  . HYPERLIPIDEMIA, MIXED 05/25/2007  . OBESITY, MORBID 05/25/2007  . Bipolar disorder (HCC) 05/25/2007  . MIGRAINE, CHRONIC 05/25/2007  . Essential hypertension 05/25/2007  . Allergic rhinitis 05/25/2007  . Obstructive sleep apnea 05/17/2007    Vincente Poli, PT 03/27/2017, 10:15 AM  [facility] [address] [place], [place], [address] Phone: [phone]   Fax:  [phone]  Name: [name] MRN: [id number] Date of Birth: 1970-07-09

## 2017-03-30 ENCOUNTER — Ambulatory Visit: Payer: Self-pay | Admitting: Adult Health

## 2017-03-30 ENCOUNTER — Encounter: Payer: Self-pay | Admitting: Physical Therapy

## 2017-03-30 ENCOUNTER — Ambulatory Visit: Payer: 59 | Admitting: Physical Therapy

## 2017-03-30 DIAGNOSIS — M6281 Muscle weakness (generalized): Secondary | ICD-10-CM

## 2017-03-30 DIAGNOSIS — M25611 Stiffness of right shoulder, not elsewhere classified: Secondary | ICD-10-CM

## 2017-03-30 DIAGNOSIS — R600 Localized edema: Secondary | ICD-10-CM | POA: Diagnosis not present

## 2017-03-30 DIAGNOSIS — M25511 Pain in right shoulder: Secondary | ICD-10-CM | POA: Diagnosis not present

## 2017-03-30 NOTE — Therapy (Signed)
[facility] [address], [address] [place], [place], [address] Phone: [phone]   Fax:  [phone]  Physical Therapy Treatment  Patient Details  Name: [name] MRN: [id number] Date of Birth: 07/20/1970 Referring Provider: Dr. Malon KindleSteven Norris   Encounter Date: 03/30/2017  PT End of Session - 03/30/17 0926    Visit Number  33    Date for PT Re-Evaluation  05/25/17    Authorization Type  UMR    PT Start Time  0924    PT Stop Time  1014    PT Time Calculation (min)  50 min    Activity Tolerance  Patient tolerated treatment well    Behavior During Therapy  [facility] for tasks assessed/performed       Past Medical History:  Diagnosis Date  . Allergy   . Asthma    pt stated treated as ashtma but not really asthma  . Bipolar disorder (HCC)   . Chicken pox   . Complication of anesthesia    [facility]  . Depression   . Diabetes mellitus   . Dyspnea   . GERD (gastroesophageal reflux disease)   . Hyperlipidemia   . Hypertension    readings  . Hypothyroidism   . Irregular heartbeat    saw dr Myrtis SerKatz sees cardiology as needed  . Migraine   . Sleep apnea     Past Surgical History:  Procedure Laterality Date  . ANKLE SURGERY  1989   left  . BACK SURGERY  1999  . CESAREAN SECTION     1995  . LAPAROSCOPIC ASSISTED VAGINAL HYSTERECTOMY  2009     BSO fibroids, DUB, pelvic pain  . THYROIDECTOMY, PARTIAL  2001   removed left  . UMBILICAL HERNIA REPAIR  2003  . WISDOM TOOTH EXTRACTION      There were no vitals filed for this visit.  Subjective Assessment - 03/30/17 0928    Subjective  I am having just stiffness, no pain today though.    Patient Stated Goals  reduce pain and improve mobility and strength    Currently in Pain?  No/denies                      [facility] Adult [facility] Treatment/Exercise - 03/30/17 0001      Shoulder Exercises: Sidelying   External  Rotation  AROM;Right 2x 10 2# added   2x 10 2# added   ABduction  AAROM;Right;10 reps PT assist mainly with lowering   PT assist mainly with lowering     Shoulder Exercises: Standing   Flexion  AAROM;Strengthening;Right;10 reps;Weights finger ladder   finger ladder   Shoulder Flexion Weight (lbs)  2    ABduction  AAROM;Right;10 reps 1.5# on wrist   1.5# on wrist   Extension  AROM;Strengthening;Right;Theraband 3x10   3x10   Theraband Level (Shoulder Extension)  Level 2 (Red)    Row  Strengthening;Both;Theraband 3x10   3x10   Theraband Level (Shoulder Row)  Level 3 (Green) 3x 10   3x 10   Other Standing Exercises  UE Ranger on #35 20x, then scaption 10x  No asst needed by PT, added 2 # to wrist   No asst needed by PT, added 2 # to wrist   Other Standing Exercises  Rockwood yellow 15x each      Shoulder Exercises: Pulleys   Flexion  3 minutes    ABduction  3 minutes  Shoulder Exercises: ROM/Strengthening   UBE (Upper Arm Bike)  L0 4x4      Moist Heat Therapy   Number Minutes Moist Heat  10 Minutes    Moist Heat Location  Shoulder               PT Short Term Goals - 02/02/17 1457      PT SHORT TERM GOAL #1   Title  independent with initial HEP with her husband helping    Status  Achieved      PT SHORT TERM GOAL #2   Title  understand how to ice the shoulder    Status  Achieved      PT SHORT TERM GOAL #3   Title  pain in right shoulder decreased >/= 25%    Status  Achieved        PT Long Term Goals - 03/30/17 1046      PT LONG TERM GOAL #1   Title  Independent with HEP and how to progress herself    Time  8    Period  Weeks    Status  On-going    Target Date  05/25/17      PT LONG TERM GOAL #2   Title  right shoulder AROM flexion and abduction > or = to 150 deg for ability to reach overhead in cabinets    Time  8    Period  Weeks    Status  Revised    Target Date  05/25/17      PT LONG TERM GOAL #3   Title  right shoulder strength is >/= 4/5  so she is able to perform her work tasks without increase in pain    Baseline  just begining strengthening and is still limited - unable to perform horizontal abduction at this time    Time  8    Period  Weeks    Status  On-going    Target Date  05/25/17      PT LONG TERM GOAL #4   Title  wean from sling and report 50% use of Rt UE as allowed by MD    Time  8    Period  Weeks    Status  Achieved    Target Date  05/25/17      PT LONG TERM GOAL #5   Title  able to lift light object like her purse due to right shoulder strength >/= 4/5 and no restrictions from the MD    Time  8    Period  Weeks    Status  On-going    Target Date  05/25/17      PT LONG TERM GOAL #6   Title  FOTO score </= 54% limitation    Baseline  57% on 10/29    Time  8    Period  Weeks    Status  On-going    Target Date  05/25/17            Plan - 03/30/17 0941    Clinical Impression Statement  Patient continues to show progress with increased resistance during exercises and increased ROM.  She continues to work towards functional Pt is still limited due to precautions and will benefit from skilled PT to safely progress ROM and strength and prevent reinjury while regaining function.      Rehab Potential  Excellent    Clinical Impairments Affecting Rehab Potential  Abduction ok, ok to strenghen and increase function, no horizontal abduction  PT Frequency  3x / week    PT Duration  8 weeks    PT Treatment/Interventions  Cryotherapy;Electrical Stimulation;Moist Heat;Ultrasound;Patient/family education;Neuromuscular re-education;Therapeutic exercise;Therapeutic activities;Taping;Vasopneumatic Device;Passive range of motion;Scar mobilization;Manual techniques    PT Next Visit Plan  strengthening shoulder and RTC progressing slowly, no horizontal abduction per MD precautions verbalized by patient    Consulted and Agree with Plan of Care  Patient       Patient will benefit from skilled therapeutic  intervention in order to improve the following deficits and impairments:  Decreased activity tolerance, Decreased mobility, Decreased strength, Increased edema, Decreased scar mobility, Impaired flexibility, Decreased endurance, Decreased coordination, Decreased range of motion, Increased fascial restricitons, Pain  Visit Diagnosis: Acute pain of right shoulder  Muscle weakness (generalized)  Stiffness of right shoulder, not elsewhere classified  Localized edema     Problem List Patient Active Problem List   Diagnosis Date Noted  . Morbid obesity with body mass index (BMI) of 50.0 to 59.9 in adult [facility]) 04/14/2016  . Type 2 diabetes mellitus without complications (HCC) 10/05/2015  . Hypersomnia with sleep apnea 06/20/2015  . Right lumbar radiculopathy 03/13/2014  . Occipital headache 01/03/2014  . 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 Benign paroxysmal positional vertigo 02/03/2013  . Ear pain 03/12/2012  . General medical examination 08/06/2011  . SINUS TACHYCARDIA 08/22/2008  . GERD 08/22/2008  . HYPERLIPIDEMIA, MIXED 05/25/2007  . OBESITY, MORBID 05/25/2007  . Bipolar disorder (HCC) 05/25/2007  . MIGRAINE, CHRONIC 05/25/2007  . Essential hypertension 05/25/2007  . Allergic rhinitis 05/25/2007  . Obstructive sleep apnea 05/17/2007    Vincente Poli, PT 03/30/2017, 12:04 PM  [facility] [address] [place], [place], [address] Phone: [phone]   Fax:  [phone]  Name: [name] MRN: [id number] Date of Birth: 1970-11-25

## 2017-04-01 ENCOUNTER — Encounter: Payer: Self-pay | Admitting: Physical Therapy

## 2017-04-01 ENCOUNTER — Ambulatory Visit: Payer: 59 | Admitting: Physical Therapy

## 2017-04-01 DIAGNOSIS — R600 Localized edema: Secondary | ICD-10-CM | POA: Diagnosis not present

## 2017-04-01 DIAGNOSIS — M6281 Muscle weakness (generalized): Secondary | ICD-10-CM | POA: Diagnosis not present

## 2017-04-01 DIAGNOSIS — M25511 Pain in right shoulder: Secondary | ICD-10-CM

## 2017-04-01 DIAGNOSIS — M25611 Stiffness of right shoulder, not elsewhere classified: Secondary | ICD-10-CM

## 2017-04-01 NOTE — Therapy (Signed)
[facility] [address], [address] [place], [place], [address] Phone: [phone]   Fax:  [phone]  Physical Therapy Treatment  Patient Details  Name: [name] MRN: [id number] Date of Birth: 1971/04/08 Referring Provider: Dr. Malon KindleSteven Norris   Encounter Date: 04/01/2017  PT End of Session - 04/01/17 0927    Visit Number  34    Date for PT Re-Evaluation  05/25/17    Authorization Type  UMR    PT Start Time  0923    PT Stop Time  1012    PT Time Calculation (min)  49 min    Activity Tolerance  Patient tolerated treatment well    Behavior During Therapy  [facility] for tasks assessed/performed       Past Medical History:  Diagnosis Date  . Allergy   . Asthma    pt stated treated as ashtma but not really asthma  . Bipolar disorder (HCC)   . Chicken pox   . Complication of anesthesia    [facility]  . Depression   . Diabetes mellitus   . Dyspnea   . GERD (gastroesophageal reflux disease)   . Hyperlipidemia   . Hypertension    readings  . Hypothyroidism   . Irregular heartbeat    saw dr Myrtis SerKatz sees cardiology as needed  . Migraine   . Sleep apnea     Past Surgical History:  Procedure Laterality Date  . ANKLE SURGERY  1989   left  . BACK SURGERY  1999  . CESAREAN SECTION     1995  . LAPAROSCOPIC ASSISTED VAGINAL HYSTERECTOMY  2009     BSO fibroids, DUB, pelvic pain  . THYROIDECTOMY, PARTIAL  2001   removed left  . UMBILICAL HERNIA REPAIR  2003  . WISDOM TOOTH EXTRACTION      There were no vitals filed for this visit.  Subjective Assessment - 04/01/17 0928    Subjective  Stiff, but not as stiff as earlier on in the week.     Currently in Pain?  No/denies    Multiple Pain Sites  No         [facility] PT Assessment - 04/01/17 0001      AROM   Overall AROM   -- Shoulder active abduction 110 degrees   Shoulder active abduction 110 degrees   Right Shoulder Flexion   116 Degrees                  [facility] Adult PT Treatment/Exercise - 04/01/17 0001      Shoulder Exercises: Supine   Other Supine Exercises  red ball at 90 degrees: 10x CW/CCW circles      Shoulder Exercises: Sidelying   External Rotation  Strengthening;Right;20 reps;Weights    External Rotation Weight (lbs)  2    ABduction  AROM;Strengthening;Right 2 x10 no asst needed for lowering today   2 x10 no asst needed for lowering today     Shoulder Exercises: Standing   ABduction  AAROM;Strengthening;Right;20 reps;Weights 1.5# on wrist for second set   1.5# on wrist for second set   Shoulder Elevation  -- 2# to second shelf 10x min asst with lowering   2# to second shelf 10x min asst with lowering   Other Standing Exercises  UE Ranger on #35 20x, then scaption 10x  No asst needed by PT, added 2 # to wrist   No asst needed by PT, added 2 # to wrist  Other Standing Exercises  Rockwood red 15x each      Shoulder Exercises: Pulleys   Other Pulley Exercises  behind the back 3x 10      Shoulder Exercises: ROM/Strengthening   UBE (Upper Arm Bike)  L0 4x4      Moist Heat Therapy   Number Minutes Moist Heat  10 Minutes    Moist Heat Location  Shoulder               PT Short Term Goals - 02/02/17 1457      PT SHORT TERM GOAL #1   Title  independent with initial HEP with her husband helping    Status  Achieved      PT SHORT TERM GOAL #2   Title  understand how to ice the shoulder    Status  Achieved      PT SHORT TERM GOAL #3   Title  pain in right shoulder decreased >/= 25%    Status  Achieved        PT Long Term Goals - 03/30/17 1046      PT LONG TERM GOAL #1   Title  Independent with HEP and how to progress herself    Time  8    Period  Weeks    Status  On-going    Target Date  05/25/17      PT LONG TERM GOAL #2   Title  right shoulder AROM flexion and abduction > or = to 150 deg for ability to reach overhead in cabinets    Time  8    Period  Weeks     Status  Revised    Target Date  05/25/17      PT LONG TERM GOAL #3   Title  right shoulder strength is >/= 4/5 so she is able to perform her work tasks without increase in pain    Baseline  just begining strengthening and is still limited - unable to perform horizontal abduction at this time    Time  8    Period  Weeks    Status  On-going    Target Date  05/25/17      PT LONG TERM GOAL #4   Title  wean from sling and report 50% use of Rt UE as allowed by MD    Time  8    Period  Weeks    Status  Achieved    Target Date  05/25/17      PT LONG TERM GOAL #5   Title  able to lift light object like her purse due to right shoulder strength >/= 4/5 and no restrictions from the MD    Time  8    Period  Weeks    Status  On-going    Target Date  05/25/17      PT LONG TERM GOAL #6   Title  FOTO score </= 54% limitation    Baseline  57% on 10/29    Time  8    Period  Weeks    Status  On-going    Target Date  05/25/17            Plan - 04/01/17 [id number]    Clinical Impression Statement  Working/progressing strength including scapular stabilization. No pain with any exercises. Pt reports she has better strength pushing down on things like when cutting her shower off.     Rehab Potential  Excellent    Clinical Impairments Affecting Rehab Potential  Abduction ok, ok to strenghen and increase function, no horizontal abduction    PT Frequency  3x / week    PT Duration  8 weeks    PT Treatment/Interventions  Cryotherapy;Electrical Stimulation;Moist Heat;Ultrasound;Patient/family education;Neuromuscular re-education;Therapeutic exercise;Therapeutic activities;Taping;Vasopneumatic Device;Passive range of motion;Scar mobilization;Manual techniques    PT Next Visit Plan  strengthening shoulder and RTC progressing slowly, no horizontal abduction per MD precautions verbalized by patient    Consulted and Agree with Plan of Care  Patient       Patient will benefit from skilled therapeutic  intervention in order to improve the following deficits and impairments:  Decreased activity tolerance, Decreased mobility, Decreased strength, Increased edema, Decreased scar mobility, Impaired flexibility, Decreased endurance, Decreased coordination, Decreased range of motion, Increased fascial restricitons, Pain  Visit Diagnosis: Acute pain of right shoulder  Muscle weakness (generalized)  Stiffness of right shoulder, not elsewhere classified     Problem List Patient Active Problem List   Diagnosis Date Noted  . Morbid obesity with body mass index (BMI) of 50.0 to 59.9 in adult [facility]) 04/14/2016  . Type 2 diabetes mellitus without complications (HCC) 10/05/2015  . Hypersomnia with sleep apnea 06/20/2015  . Right lumbar radiculopathy 03/13/2014  . Occipital headache 01/03/2014  . 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 Benign paroxysmal positional vertigo 02/03/2013  . Ear pain 03/12/2012  . General medical examination 08/06/2011  . SINUS TACHYCARDIA 08/22/2008  . GERD 08/22/2008  . HYPERLIPIDEMIA, MIXED 05/25/2007  . OBESITY, MORBID 05/25/2007  . Bipolar disorder (HCC) 05/25/2007  . MIGRAINE, CHRONIC 05/25/2007  . Essential hypertension 05/25/2007  . Allergic rhinitis 05/25/2007  . Obstructive sleep apnea 05/17/2007    Rhegan Trunnell, PTA 04/01/2017, 10:02 AM  [facility] [address], [address] [place], [place], [address] Phone: [phone]   Fax:  [phone]  Name: [name] MRN: [id number] Date of Birth: 15-Oct-1970

## 2017-04-03 ENCOUNTER — Encounter: Payer: Self-pay | Admitting: Physical Therapy

## 2017-04-03 ENCOUNTER — Ambulatory Visit: Payer: 59 | Admitting: Physical Therapy

## 2017-04-03 DIAGNOSIS — R600 Localized edema: Secondary | ICD-10-CM | POA: Diagnosis not present

## 2017-04-03 DIAGNOSIS — M25611 Stiffness of right shoulder, not elsewhere classified: Secondary | ICD-10-CM | POA: Diagnosis not present

## 2017-04-03 DIAGNOSIS — M6281 Muscle weakness (generalized): Secondary | ICD-10-CM | POA: Diagnosis not present

## 2017-04-03 DIAGNOSIS — M25511 Pain in right shoulder: Secondary | ICD-10-CM

## 2017-04-03 MED FILL — ATORVASTATIN 40 MG TABLET: 40 | 90 days supply | Qty: 90 | Fill #1

## 2017-04-03 MED FILL — GABAPENTIN 600 MG TABS: 600 | 90 days supply | Qty: 270 | Fill #1

## 2017-04-03 NOTE — Therapy (Signed)
[facility] [address], [address] [place], [place], [address] Phone: [phone]   Fax:  [phone]  Physical Therapy Treatment  Patient Details  Name: Crystal Guadeloupeammy Arzola MRN: [id number] Date of Birth: 1970-08-25 Referring Provider: Dr. Malon KindleSteven Norris   Encounter Date: 04/03/2017  PT End of Session - 04/03/17 0926    Visit Number  35    Date for PT Re-Evaluation  05/25/17    Authorization Type  UMR    PT Start Time  0925    PT Stop Time  1015    PT Time Calculation (min)  50 min    Activity Tolerance  Patient tolerated treatment well    Behavior During Therapy  [facility] for tasks assessed/performed       Past Medical History:  Diagnosis Date  . Allergy   . Asthma    pt stated treated as ashtma but not really asthma  . Bipolar disorder (HCC)   . Chicken pox   . Complication of anesthesia    [facility]  . Depression   . Diabetes mellitus   . Dyspnea   . GERD (gastroesophageal reflux disease)   . Hyperlipidemia   . Hypertension    readings  . Hypothyroidism   . Irregular heartbeat    saw dr Myrtis SerKatz sees cardiology as needed  . Migraine   . Sleep apnea     Past Surgical History:  Procedure Laterality Date  . ANKLE SURGERY  1989   left  . BACK SURGERY  1999  . CESAREAN SECTION     1995  . LAPAROSCOPIC ASSISTED VAGINAL HYSTERECTOMY  2009     BSO fibroids, DUB, pelvic pain  . THYROIDECTOMY, PARTIAL  2001   removed left  . UMBILICAL HERNIA REPAIR  2003  . WISDOM TOOTH EXTRACTION      There were no vitals filed for this visit.  Subjective Assessment - 04/03/17 0929    Subjective  Stiff    Currently in Pain?  No/denies    Multiple Pain Sites  No                      [facility] Adult PT Treatment/Exercise - 04/03/17 0001      Shoulder Exercises: Supine   Other Supine Exercises  red ball at 90 degrees: 10x CW/CCW circles      Shoulder Exercises: Sidelying    External Rotation  Strengthening;Right;20 reps;Weights    External Rotation Weight (lbs)  2    ABduction  AROM;Strengthening;Right 2 x10 no asst needed for lowering today      Shoulder Exercises: Standing   Flexion  AROM;Strengthening;Right;20 reps;Weights    Shoulder Flexion Weight (lbs)  1    ABduction  AAROM;Strengthening;Right;20 reps;Weights 1# on wrist for second set    Shoulder Elevation  -- 2# to second shelf 15x2 min asst with lowering    Other Standing Exercises  UE Ranger on #35 20x, then scaption 10x  No asst needed by PT, added 2 # to wrist    Other Standing Exercises  Rockwood red 15x each      Shoulder Exercises: Pulleys   Flexion  3 minutes    ABduction  3 minutes    Other Pulley Exercises  behind the back 3x 10      Shoulder Exercises: ROM/Strengthening   UBE (Upper Arm Bike)  L0 4x4      Moist Heat Therapy   Number Minutes Moist Heat  10 Minutes    Moist Heat Location  Shoulder               PT Short Term Goals - 02/02/17 1457      PT SHORT TERM GOAL #1   Title  independent with initial HEP with her husband helping    Status  Achieved      PT SHORT TERM GOAL #2   Title  understand how to ice the shoulder    Status  Achieved      PT SHORT TERM GOAL #3   Title  pain in right shoulder decreased >/= 25%    Status  Achieved        PT Long Term Goals - 03/30/17 1046      PT LONG TERM GOAL #1   Title  Independent with HEP and how to progress herself    Time  8    Period  Weeks    Status  On-going    Target Date  05/25/17      PT LONG TERM GOAL #2   Title  right shoulder AROM flexion and abduction > or = to 150 deg for ability to reach overhead in cabinets    Time  8    Period  Weeks    Status  Revised    Target Date  05/25/17      PT LONG TERM GOAL #3   Title  right shoulder strength is >/= 4/5 so she is able to perform her work tasks without increase in pain    Baseline  just begining strengthening and is still limited - unable to  perform horizontal abduction at this time    Time  8    Period  Weeks    Status  On-going    Target Date  05/25/17      PT LONG TERM GOAL #4   Title  wean from sling and report 50% use of Rt UE as allowed by MD    Time  8    Period  Weeks    Status  Achieved    Target Date  05/25/17      PT LONG TERM GOAL #5   Title  able to lift light object like her purse due to right shoulder strength >/= 4/5 and no restrictions from the MD    Time  8    Period  Weeks    Status  On-going    Target Date  05/25/17      PT LONG TERM GOAL #6   Title  FOTO score </= 54% limitation    Baseline  57% on 10/29    Time  8    Period  Weeks    Status  On-going    Target Date  05/25/17            Plan - 04/03/17 0926    Clinical Impression Statement  Continues to do very well with her functional strength and end range ROM. No pain.     Rehab Potential  Excellent    Clinical Impairments Affecting Rehab Potential  Abduction ok, ok to strenghen and increase function, no horizontal abduction    PT Frequency  3x / week    PT Duration  8 weeks    PT Treatment/Interventions  Cryotherapy;Electrical Stimulation;Moist Heat;Ultrasound;Patient/family education;Neuromuscular re-education;Therapeutic exercise;Therapeutic activities;Taping;Vasopneumatic Device;Passive range of motion;Scar mobilization;Manual techniques    PT Next Visit Plan  strengthening shoulder and RTC progressing slowly, no horizontal abduction per MD precautions verbalized by patient  PT Home Exercise Plan  progress as needed    Consulted and Agree with Plan of Care  Patient       Patient will benefit from skilled therapeutic intervention in order to improve the following deficits and impairments:  Decreased activity tolerance, Decreased mobility, Decreased strength, Increased edema, Decreased scar mobility, Impaired flexibility, Decreased endurance, Decreased coordination, Decreased range of motion, Increased fascial restricitons,  Pain  Visit Diagnosis: Acute pain of right shoulder  Muscle weakness (generalized)  Stiffness of right shoulder, not elsewhere classified     Problem List Patient Active Problem List   Diagnosis Date Noted  . Morbid obesity with body mass index (BMI) of 50.0 to 59.9 in adult [facility]) 04/14/2016  . Type 2 diabetes mellitus without complications (HCC) 10/05/2015  . Hypersomnia with sleep apnea 06/20/2015  . Right lumbar radiculopathy 03/13/2014  . Occipital headache 01/03/2014  . 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 Benign paroxysmal positional vertigo 02/03/2013  . Ear pain 03/12/2012  . General medical examination 08/06/2011  . SINUS TACHYCARDIA 08/22/2008  . GERD 08/22/2008  . HYPERLIPIDEMIA, MIXED 05/25/2007  . OBESITY, MORBID 05/25/2007  . Bipolar disorder (HCC) 05/25/2007  . MIGRAINE, CHRONIC 05/25/2007  . Essential hypertension 05/25/2007  . Allergic rhinitis 05/25/2007  . Obstructive sleep apnea 05/17/2007    [name], PTA 04/03/2017, 10:05 AM  [facility] [address], [address] [place], [place], [address] Phone: [phone]   Fax:  [phone]  Name: [name] MRN: [id number] Date of Birth: [birth year]

## 2017-04-06 ENCOUNTER — Ambulatory Visit: Payer: 59 | Admitting: Physical Therapy

## 2017-04-06 ENCOUNTER — Encounter: Payer: Self-pay | Admitting: Physical Therapy

## 2017-04-06 DIAGNOSIS — M25511 Pain in right shoulder: Secondary | ICD-10-CM

## 2017-04-06 DIAGNOSIS — M25611 Stiffness of right shoulder, not elsewhere classified: Secondary | ICD-10-CM

## 2017-04-06 DIAGNOSIS — M6281 Muscle weakness (generalized): Secondary | ICD-10-CM | POA: Diagnosis not present

## 2017-04-06 DIAGNOSIS — R600 Localized edema: Secondary | ICD-10-CM | POA: Diagnosis not present

## 2017-04-06 NOTE — Therapy (Signed)
[facility] [address] [place], [place], [address] Phone: [phone]   Fax:  [phone]  Physical Therapy Treatment  Patient Details  Name: [name] MRN: [id number] Date of Birth: [birth year] Referring Provider: Dr. Malon Kindle   Encounter Date: 04/06/2017  PT End of Session - 04/06/17 0933    Visit Number  36    Date for PT Re-Evaluation  05/25/17    Authorization Type  UMR    PT Start Time  0930    PT Stop Time  1020    PT Time Calculation (min)  50 min    Activity Tolerance  Patient tolerated treatment well    Behavior During Therapy  [facility] for tasks assessed/performed       Past Medical History:  Diagnosis Date  . Allergy   . Asthma    pt stated treated as ashtma but not really asthma  . Bipolar disorder (HCC)   . Chicken pox   . Complication of anesthesia    oxygen saturation dropped after hysterectomy 2009 at [facility]  . Depression   . Diabetes mellitus   . Dyspnea   . GERD (gastroesophageal reflux disease)   . Hyperlipidemia   . Hypertension    readings  . Hypothyroidism   . Irregular heartbeat    saw dr Myrtis Ser sees cardiology as needed  . Migraine   . Sleep apnea     Past Surgical History:  Procedure Laterality Date  . ANKLE SURGERY  1989   left  . BACK SURGERY  1999  . CESAREAN SECTION     1995  . LAPAROSCOPIC ASSISTED VAGINAL HYSTERECTOMY  2009     BSO fibroids, DUB, pelvic pain  . THYROIDECTOMY, PARTIAL  2001   removed left  . UMBILICAL HERNIA REPAIR  2003  . WISDOM TOOTH EXTRACTION      There were no vitals filed for this visit.  Subjective Assessment - 04/06/17 0934    Subjective  I tried doing my stretches later in the day and I do not feel as stiff.     Currently in Pain?  No/denies    Multiple Pain Sites  No                      [facility] Adult PT Treatment/Exercise - 04/06/17 0001      Shoulder Exercises: Sidelying   External Rotation   Strengthening;Right;20 reps;Weights    External Rotation Weight (lbs)  2 took wt away on 2nd set due to fatigue.    ABduction  AROM;Strengthening;Right;20 reps;Weights    ABduction Weight (lbs)  1      Shoulder Exercises: Standing   Flexion  AROM;Strengthening;Right;20 reps;Weights    Shoulder Flexion Weight (lbs)  1 & 2#    ABduction  AAROM;Strengthening;Right;20 reps;Weights 1# on wrist for all sets    Shoulder Elevation  -- 2# to second shelf 15x2 min asst with lowering    Other Standing Exercises  UE Ranger on #35 20x, then scaption 10x  No asst needed by PT, added 2 # to wrist    Other Standing Exercises  Rock[place] green 15x each      Shoulder Exercises: Pulleys   Flexion  3 minutes Assit pt with end range stretch    ABduction  3 minutes    Other Pulley Exercises  behind the back 3x 10      Shoulder Exercises: ROM/Strengthening   UBE (Upper Arm Bike)  L0 5x5  Moist Heat Therapy   Number Minutes Moist Heat  10 Minutes    Moist Heat Location  Shoulder             PT Education - 04/06/17 0950    Education provided  Yes    Education Details  end range flexion stretch for HEP    Person(s) Educated  Patient    Methods  Explanation;Demonstration;Tactile cues;Verbal cues    Comprehension  Returned demonstration;Verbalized understanding       PT Short Term Goals - 02/02/17 1457      PT SHORT TERM GOAL #1   Title  independent with initial HEP with her husband helping    Status  Achieved      PT SHORT TERM GOAL #2   Title  understand how to ice the shoulder    Status  Achieved      PT SHORT TERM GOAL #3   Title  pain in right shoulder decreased >/= 25%    Status  Achieved        PT Long Term Goals - 03/30/17 1046      PT LONG TERM GOAL #1   Title  Independent with HEP and how to progress herself    Time  8    Period  Weeks    Status  On-going    Target Date  05/25/17      PT LONG TERM GOAL #2   Title  right shoulder AROM flexion and abduction > or =  to 150 deg for ability to reach overhead in cabinets    Time  8    Period  Weeks    Status  Revised    Target Date  05/25/17      PT LONG TERM GOAL #3   Title  right shoulder strength is >/= 4/5 so she is able to perform her work tasks without increase in pain    Baseline  just begining strengthening and is still limited - unable to perform horizontal abduction at this time    Time  8    Period  Weeks    Status  On-going    Target Date  05/25/17      PT LONG TERM GOAL #4   Title  wean from sling and report 50% use of Rt UE as allowed by MD    Time  8    Period  Weeks    Status  Achieved    Target Date  05/25/17      PT LONG TERM GOAL #5   Title  able to lift light object like her purse due to right shoulder strength >/= 4/5 and no restrictions from the MD    Time  8    Period  Weeks    Status  On-going    Target Date  05/25/17      PT LONG TERM GOAL #6   Title  FOTO score </= 54% limitation    Baseline  57% on 10/29    Time  8    Period  Weeks    Status  On-going    Target Date  05/25/17            Plan - 04/06/17 0933    Clinical Impression Statement  Pt was able to lift heavier weights today to work on her strength. Was quite fatigued at the end of her session, visably shaking when performing her sidelying exs.     Rehab Potential  Excellent    Clinical  Impairments Affecting Rehab Potential  Abduction ok, ok to strenghen and increase function, no horizontal abduction    PT Frequency  3x / week    PT Duration  8 weeks    PT Treatment/Interventions  Cryotherapy;Electrical Stimulation;Moist Heat;Ultrasound;Patient/family education;Neuromuscular re-education;Therapeutic exercise;Therapeutic activities;Taping;Vasopneumatic Device;Passive range of motion;Scar mobilization;Manual techniques    PT Next Visit Plan  strengthening shoulder and RTC progressing slowly, no horizontal abduction per MD precautions verbalized by patient    Consulted and Agree with Plan of Care   Patient       Patient will benefit from skilled therapeutic intervention in order to improve the following deficits and impairments:  Decreased activity tolerance, Decreased mobility, Decreased strength, Increased edema, Decreased scar mobility, Impaired flexibility, Decreased endurance, Decreased coordination, Decreased range of motion, Increased fascial restricitons, Pain  Visit Diagnosis: Acute pain of right shoulder  Muscle weakness (generalized)  Stiffness of right shoulder, not elsewhere classified     Problem List Patient Active Problem List   Diagnosis Date Noted  . Morbid obesity with body mass index (BMI) of 50.0 to 59.9 in adult [facility](HCC) 04/14/2016  . Type 2 diabetes mellitus without complications (HCC) 10/05/2015  . Hypersomnia with sleep apnea 06/20/2015  . Right lumbar radiculopathy 03/13/2014  . Occipital headache 01/03/2014  . 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 Benign paroxysmal positional vertigo 02/03/2013  . Ear pain 03/12/2012  . General medical examination 08/06/2011  . SINUS TACHYCARDIA 08/22/2008  . GERD 08/22/2008  . HYPERLIPIDEMIA, MIXED 05/25/2007  . OBESITY, MORBID 05/25/2007  . Bipolar disorder (HCC) 05/25/2007  . MIGRAINE, CHRONIC 05/25/2007  . Essential hypertension 05/25/2007  . Allergic rhinitis 05/25/2007  . Obstructive sleep apnea 05/17/2007    Tobiah Celestine, PTA 04/06/2017, 10:11 AM  [facility] [address] [place], [place], [address] Phone: [phone]   Fax:  [phone]  Name: Sindy Guadeloupeammy Goslin MRN: [id number] Date of Birth: 05/16/1971

## 2017-04-08 ENCOUNTER — Ambulatory Visit: Payer: 59 | Admitting: Physical Therapy

## 2017-04-08 ENCOUNTER — Encounter: Payer: Self-pay | Admitting: Physical Therapy

## 2017-04-08 DIAGNOSIS — R600 Localized edema: Secondary | ICD-10-CM | POA: Diagnosis not present

## 2017-04-08 DIAGNOSIS — M6281 Muscle weakness (generalized): Secondary | ICD-10-CM | POA: Diagnosis not present

## 2017-04-08 DIAGNOSIS — M25611 Stiffness of right shoulder, not elsewhere classified: Secondary | ICD-10-CM | POA: Diagnosis not present

## 2017-04-08 DIAGNOSIS — M25511 Pain in right shoulder: Secondary | ICD-10-CM

## 2017-04-08 NOTE — Therapy (Signed)
[facility] [address], [address] [place], [place], [address] Phone: [phone]   Fax:  [phone]  Physical Therapy Treatment  Patient Details  Name: [name] MRN: [id number] Date of Birth: 09-08-1970 Referring Provider: Dr. Malon KindleSteven Norris   Encounter Date: 04/08/2017  PT End of Session - 04/08/17 0923    Visit Number  37    Date for PT Re-Evaluation  05/25/17    Authorization Type  UMR    PT Start Time  0923    PT Stop Time  1011    PT Time Calculation (min)  48 min    Activity Tolerance  Patient tolerated treatment well    Behavior During Therapy  [facility] for tasks assessed/performed       Past Medical History:  Diagnosis Date  . Allergy   . Asthma    pt stated treated as ashtma but not really asthma  . Bipolar disorder (HCC)   . Chicken pox   . Complication of anesthesia    [facility]  . Depression   . Diabetes mellitus   . Dyspnea   . GERD (gastroesophageal reflux disease)   . Hyperlipidemia   . Hypertension    readings  . Hypothyroidism   . Irregular heartbeat    saw dr Myrtis SerKatz sees cardiology as needed  . Migraine   . Sleep apnea     Past Surgical History:  Procedure Laterality Date  . ANKLE SURGERY  1989   left  . BACK SURGERY  1999  . CESAREAN SECTION     1995  . LAPAROSCOPIC ASSISTED VAGINAL HYSTERECTOMY  2009     BSO fibroids, DUB, pelvic pain  . THYROIDECTOMY, PARTIAL  2001   removed left  . UMBILICAL HERNIA REPAIR  2003  . WISDOM TOOTH EXTRACTION      There were no vitals filed for this visit.  Subjective Assessment - 04/08/17 0926    Subjective  No pain, I had some good soreness after last session. It was better the next day.     Currently in Pain?  No/denies    Multiple Pain Sites  No                      [facility] Adult PT Treatment/Exercise - 04/08/17 0001      Shoulder Exercises: Sidelying   External Rotation   Strengthening;Right;20 reps;Weights    External Rotation Weight (lbs)  1 too fatigued with 2# today    ABduction  AROM;Strengthening;Right;20 reps;Weights    ABduction Weight (lbs)  1      Shoulder Exercises: Standing   Flexion  AROM;Strengthening;Right;20 reps;Weights 2 sets    Shoulder Flexion Weight (lbs)  1 & 2#    ABduction  AAROM;Strengthening;Right;20 reps;Weights 1# on wrist for all sets    Shoulder Elevation  -- 2# to second shelf 15x2 min asst with lowering, scaption 20x    Other Standing Exercises  UE Ranger on #35 20x, then scaption 10x  No asst needed by PT, added 2 # to wrist    Other Standing Exercises  [place] green 15x2 each      Shoulder Exercises: Pulleys   Flexion  3 minutes Assit pt with end range stretch    ABduction  3 minutes    Other Pulley Exercises  behind the back 3x 10      Shoulder Exercises: ROM/Strengthening   UBE (Upper Arm Bike)  L0 5x5  Shoulder Exercises: Stretch   Other Shoulder Stretches  Counter top stretch 2x 15 sec      Moist Heat Therapy   Number Minutes Moist Heat  10 Minutes    Moist Heat Location  Shoulder      Manual Therapy   Manual Therapy  -- Stretching Rt scapula in Lt sidelying               PT Short Term Goals - 02/02/17 1457      PT SHORT TERM GOAL #1   Title  independent with initial HEP with her husband helping    Status  Achieved      PT SHORT TERM GOAL #2   Title  understand how to ice the shoulder    Status  Achieved      PT SHORT TERM GOAL #3   Title  pain in right shoulder decreased >/= 25%    Status  Achieved        PT Long Term Goals - 03/30/17 1046      PT LONG TERM GOAL #1   Title  Independent with HEP and how to progress herself    Time  8    Period  Weeks    Status  On-going    Target Date  05/25/17      PT LONG TERM GOAL #2   Title  right shoulder AROM flexion and abduction > or = to 150 deg for ability to reach overhead in cabinets    Time  8    Period  Weeks    Status   Revised    Target Date  05/25/17      PT LONG TERM GOAL #3   Title  right shoulder strength is >/= 4/5 so she is able to perform her work tasks without increase in pain    Baseline  just begining strengthening and is still limited - unable to perform horizontal abduction at this time    Time  8    Period  Weeks    Status  On-going    Target Date  05/25/17      PT LONG TERM GOAL #4   Title  wean from sling and report 50% use of Rt UE as allowed by MD    Time  8    Period  Weeks    Status  Achieved    Target Date  05/25/17      PT LONG TERM GOAL #5   Title  able to lift light object like her purse due to right shoulder strength >/= 4/5 and no restrictions from the MD    Time  8    Period  Weeks    Status  On-going    Target Date  05/25/17      PT LONG TERM GOAL #6   Title  FOTO score </= 54% limitation    Baseline  57% on 10/29    Time  8    Period  Weeks    Status  On-going    Target Date  05/25/17            Plan - 04/08/17 [id number]    Clinical Impression Statement  Pt was pretty sore after her last session so for todays session we kept weights the same which continue to challenge her strength appropriately .     Rehab Potential  Excellent    Clinical Impairments Affecting Rehab Potential  Abduction ok, ok to strenghen and increase function, no horizontal abduction  PT Frequency  3x / week    PT Duration  8 weeks    PT Treatment/Interventions  Cryotherapy;Electrical Stimulation;Moist Heat;Ultrasound;Patient/family education;Neuromuscular re-education;Therapeutic exercise;Therapeutic activities;Taping;Vasopneumatic Device;Passive range of motion;Scar mobilization;Manual techniques    PT Next Visit Plan  strengthening shoulder and RTC progressing slowly, no horizontal abduction per MD precautions verbalized by patient    Consulted and Agree with Plan of Care  Patient       Patient will benefit from skilled therapeutic intervention in order to improve the following  deficits and impairments:  Decreased activity tolerance, Decreased mobility, Decreased strength, Increased edema, Decreased scar mobility, Impaired flexibility, Decreased endurance, Decreased coordination, Decreased range of motion, Increased fascial restricitons, Pain  Visit Diagnosis: Acute pain of right shoulder  Muscle weakness (generalized)  Stiffness of right shoulder, not elsewhere classified     Problem List Patient Active Problem List   Diagnosis Date Noted  . Morbid obesity with body mass index (BMI) of 50.0 to 59.9 in adult [facility]) 04/14/2016  . Type 2 diabetes mellitus without complications (HCC) 10/05/2015  . Hypersomnia with sleep apnea 06/20/2015  . Right lumbar radiculopathy 03/13/2014  . Occipital headache 01/03/2014  . 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 Benign paroxysmal positional vertigo 02/03/2013  . Ear pain 03/12/2012  . General medical examination 08/06/2011  . SINUS TACHYCARDIA 08/22/2008  . GERD 08/22/2008  . HYPERLIPIDEMIA, MIXED 05/25/2007  . OBESITY, MORBID 05/25/2007  . Bipolar disorder (HCC) 05/25/2007  . MIGRAINE, CHRONIC 05/25/2007  . Essential hypertension 05/25/2007  . Allergic rhinitis 05/25/2007  . Obstructive sleep apnea 05/17/2007    Camaron Cammack, PTA 04/08/2017, 10:11 AM  [facility] [address] [place], [place], [address] Phone: [phone]   Fax:  [phone]  Name: [name] MRN: [id number] Date of Birth: [birth year]

## 2017-04-10 ENCOUNTER — Ambulatory Visit: Payer: 59 | Admitting: Physical Therapy

## 2017-04-10 ENCOUNTER — Encounter: Payer: Self-pay | Admitting: Physical Therapy

## 2017-04-10 DIAGNOSIS — M25611 Stiffness of right shoulder, not elsewhere classified: Secondary | ICD-10-CM

## 2017-04-10 DIAGNOSIS — M6281 Muscle weakness (generalized): Secondary | ICD-10-CM | POA: Diagnosis not present

## 2017-04-10 DIAGNOSIS — M25511 Pain in right shoulder: Secondary | ICD-10-CM

## 2017-04-10 DIAGNOSIS — R600 Localized edema: Secondary | ICD-10-CM | POA: Diagnosis not present

## 2017-04-10 NOTE — Therapy (Signed)
[facility] [address], [address] [place], [place], [address] Phone: [phone]   Fax:  [phone]  Physical Therapy Treatment  Patient Details  Name: [name] MRN: [id number] Date of Birth: 18-May-1971 Referring Provider: Dr. Malon Kindle   Encounter Date: 04/10/2017  PT End of Session - 04/10/17 0922    Visit Number  38    Date for PT Re-Evaluation  05/25/17    Authorization Type  UMR    PT Start Time  0922    PT Stop Time  1020    PT Time Calculation (min)  58 min    Activity Tolerance  Patient tolerated treatment well    Behavior During Therapy  [facility] for tasks assessed/performed       Past Medical History:  Diagnosis Date  . Allergy   . Asthma    pt stated treated as ashtma but not really asthma  . Bipolar disorder (HCC)   . Chicken pox   . Complication of anesthesia    oxygen saturation dropped after hysterectomy 2009 at [facility]  . Depression   . Diabetes mellitus   . Dyspnea   . GERD (gastroesophageal reflux disease)   . Hyperlipidemia   . Hypertension    readings  . Hypothyroidism   . Irregular heartbeat    saw dr Myrtis Ser sees cardiology as needed  . Migraine   . Sleep apnea     Past Surgical History:  Procedure Laterality Date  . ANKLE SURGERY  1989   left  . BACK SURGERY  1999  . CESAREAN SECTION     1995  . LAPAROSCOPIC ASSISTED VAGINAL HYSTERECTOMY  2009     BSO fibroids, DUB, pelvic pain  . LAPAROSCOPIC ROUX-EN-Y GASTRIC BYPASS  WITH UPPER ENDOSCOPY N/A 04/14/2016   Performed by Glenna Fellows, MD at [facility] ORS  . Right shoulder arthroscopy, A-subcromial decompression, mini open rotator cuff repair, open distal clavicle resection, biceps tenodesis Right 01/02/2017   Performed by Beverely Low, MD at [facility] OR  . THYROIDECTOMY, PARTIAL  2001   removed left  . UMBILICAL HERNIA REPAIR  2003  . WISDOM TOOTH EXTRACTION      There were no vitals filed for this visit.  Subjective Assessment -  04/10/17 0924    Subjective  Still sore from last session, but not bad.     Currently in Pain?  Yes    Pain Score  1     Pain Location  Shoulder    Pain Orientation  Right    Pain Descriptors / Indicators  Sore    Aggravating Factors   Exercises making muscularly sore, not bad pain.    Pain Relieving Factors  heat    Multiple Pain Sites  No         [facility] PT Assessment - 04/10/17 0001      AROM   Right Shoulder Flexion  115 Degrees Passive flexion 135                  [facility] Adult PT Treatment/Exercise - 04/10/17 0001      Shoulder Exercises: Sidelying   External Rotation  Strengthening;Right;20 reps;Weights    External Rotation Weight (lbs)  1    ABduction  AROM;Strengthening;Right;20 reps;Weights    ABduction Weight (lbs)  1      Shoulder Exercises: Standing   Flexion  AROM;Strengthening;Right;20 reps;Weights 2 sets    Shoulder Flexion Weight (lbs)  1 & 2    ABduction  AAROM;Strengthening;Right;20 reps;Weights 1#  on wrist for all sets    Shoulder Elevation  -- 2# to second shelf 15x2 min asst with lowering, scaption 20x    Other Standing Exercises  UE Ranger on #35 20x, then scaption 10x  No asst needed by PT, added 2 # to wrist    Other Standing Exercises  [place] green 15x2 each      Shoulder Exercises: Pulleys   Flexion  3 minutes Assit pt with end range stretch    ABduction  3 minutes    Other Pulley Exercises  behind the back 3x 10      Shoulder Exercises: ROM/Strengthening   UBE (Upper Arm Bike)  L0 5x5       Moist Heat Therapy   Number Minutes Moist Heat  10 Minutes    Moist Heat Location  Shoulder               PT Short Term Goals - 02/02/17 1457      PT SHORT TERM GOAL #1   Title  independent with initial HEP with her husband helping    Status  Achieved      PT SHORT TERM GOAL #2   Title  understand how to ice the shoulder    Status  Achieved      PT SHORT TERM GOAL #3   Title  pain in right shoulder decreased >/= 25%     Status  Achieved        PT Long Term Goals - 03/30/17 1046      PT LONG TERM GOAL #1   Title  Independent with HEP and how to progress herself    Time  8    Period  Weeks    Status  On-going    Target Date  05/25/17      PT LONG TERM GOAL #2   Title  right shoulder AROM flexion and abduction > or = to 150 deg for ability to reach overhead in cabinets    Time  8    Period  Weeks    Status  Revised    Target Date  05/25/17      PT LONG TERM GOAL #3   Title  right shoulder strength is >/= 4/5 so she is able to perform her work tasks without increase in pain    Baseline  just begining strengthening and is still limited - unable to perform horizontal abduction at this time    Time  8    Period  Weeks    Status  On-going    Target Date  05/25/17      PT LONG TERM GOAL #4   Title  wean from sling and report 50% use of Rt UE as allowed by MD    Time  8    Period  Weeks    Status  Achieved    Target Date  05/25/17      PT LONG TERM GOAL #5   Title  able to lift light object like her purse due to right shoulder strength >/= 4/5 and no restrictions from the MD    Time  8    Period  Weeks    Status  On-going    Target Date  05/25/17      PT LONG TERM GOAL #6   Title  FOTO score </= 54% limitation    Baseline  57% on 10/29    Time  8    Period  Weeks    Status  On-going    Target Date  05/25/17            Plan - 04/10/17 [id number]    Clinical Impression Statement  Pt continues with soreness from current exercise regime. No pain, just muscular soreness. Pt does report her ADL function continues to improve. She describes this as a "feeling of being solid in my shoulder."  In standing pt reaches to 115 degrees independently and can be pushed passively to 135 degrees.     Rehab Potential  Excellent    Clinical Impairments Affecting Rehab Potential  Abduction ok, ok to strenghen and increase function, no horizontal abduction    PT Frequency  3x / week    PT Duration  8 weeks     PT Treatment/Interventions  Cryotherapy;Electrical Stimulation;Moist Heat;Ultrasound;Patient/family education;Neuromuscular re-education;Therapeutic exercise;Therapeutic activities;Taping;Vasopneumatic Device;Passive range of motion;Scar mobilization;Manual techniques    PT Next Visit Plan  strengthening shoulder and RTC progressing slowly, no horizontal abduction per MD precautions verbalized by patient    Consulted and Agree with Plan of Care  Patient       Patient will benefit from skilled therapeutic intervention in order to improve the following deficits and impairments:  Decreased activity tolerance, Decreased mobility, Decreased strength, Increased edema, Decreased scar mobility, Impaired flexibility, Decreased endurance, Decreased coordination, Decreased range of motion, Increased fascial restricitons, Pain  Visit Diagnosis: Acute pain of right shoulder  Muscle weakness (generalized)  Stiffness of right shoulder, not elsewhere classified     Problem List Patient Active Problem List   Diagnosis Date Noted  . Morbid obesity with body mass index (BMI) of 50.0 to 59.9 in adult [facility](HCC) 04/14/2016  . Type 2 diabetes mellitus without complications (HCC) 10/05/2015  . Hypersomnia with sleep apnea 06/20/2015  . Right lumbar radiculopathy 03/13/2014  . Occipital headache 01/03/2014  . 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 Benign paroxysmal positional vertigo 02/03/2013  . Ear pain 03/12/2012  . General medical examination 08/06/2011  . SINUS TACHYCARDIA 08/22/2008  . GERD 08/22/2008  . HYPERLIPIDEMIA, MIXED 05/25/2007  . OBESITY, MORBID 05/25/2007  . Bipolar disorder (HCC) 05/25/2007  . MIGRAINE, CHRONIC 05/25/2007  . Essential hypertension 05/25/2007  . Allergic rhinitis 05/25/2007  . Obstructive sleep apnea 05/17/2007    Persephonie Hegwood, PTA 04/10/2017, 10:07 AM  [facility] [address], [address] [place],  [place], [address] Phone: [phone]   Fax:  [phone]  Name: Sindy Guadeloupeammy Keats MRN: [id number] Date of Birth: 1971/01/15

## 2017-04-13 ENCOUNTER — Ambulatory Visit: Payer: 59 | Admitting: Physical Therapy

## 2017-04-13 ENCOUNTER — Ambulatory Visit: Payer: Self-pay | Admitting: Internal Medicine

## 2017-04-13 ENCOUNTER — Encounter: Payer: Self-pay | Admitting: Physical Therapy

## 2017-04-13 DIAGNOSIS — R600 Localized edema: Secondary | ICD-10-CM | POA: Diagnosis not present

## 2017-04-13 DIAGNOSIS — M6281 Muscle weakness (generalized): Secondary | ICD-10-CM | POA: Diagnosis not present

## 2017-04-13 DIAGNOSIS — M25611 Stiffness of right shoulder, not elsewhere classified: Secondary | ICD-10-CM | POA: Diagnosis not present

## 2017-04-13 DIAGNOSIS — M25511 Pain in right shoulder: Secondary | ICD-10-CM | POA: Diagnosis not present

## 2017-04-13 NOTE — Therapy (Signed)
[facility] [address], [address] [place], [place], [address] Phone: [phone]   Fax:  [phone]  Physical Therapy Treatment  Patient Details  Name: [name] MRN: [id number] Date of Birth: 11/24/1970 Referring Provider: Dr. Malon KindleSteven Norris   Encounter Date: 04/13/2017  PT End of Session - 04/13/17 0930    Visit Number  39    Date for PT Re-Evaluation  05/25/17    Authorization Type  UMR    PT Start Time  0925    PT Stop Time  1020    PT Time Calculation (min)  55 min    Activity Tolerance  Patient tolerated treatment well    Behavior During Therapy  [facility] for tasks assessed/performed       Past Medical History:  Diagnosis Date  . Allergy   . Asthma    pt stated treated as ashtma but not really asthma  . Bipolar disorder (HCC)   . Chicken pox   . Complication of anesthesia    [facility]  . Depression   . Diabetes mellitus   . Dyspnea   . GERD (gastroesophageal reflux disease)   . Hyperlipidemia   . Hypertension    readings  . Hypothyroidism   . Irregular heartbeat    saw dr Myrtis SerKatz sees cardiology as needed  . Migraine   . Sleep apnea     Past Surgical History:  Procedure Laterality Date  . ANKLE SURGERY  1989   left  . BACK SURGERY  1999  . CESAREAN SECTION     1995  . LAPAROSCOPIC ASSISTED VAGINAL HYSTERECTOMY  2009     BSO fibroids, DUB, pelvic pain  . LAPAROSCOPIC ROUX-EN-Y GASTRIC BYPASS  WITH UPPER ENDOSCOPY N/A 04/14/2016   Performed by Glenna FellowsHoxworth, Benjamin, MD at [facility]  . Right shoulder arthroscopy, A-subcromial decompression, mini open rotator cuff repair, open distal clavicle resection, biceps tenodesis Right 01/02/2017   Performed by Beverely LowNorris, Steve, [facility]  . THYROIDECTOMY, PARTIAL  2001   removed left  . UMBILICAL HERNIA REPAIR  2003  . WISDOM TOOTH EXTRACTION      There were no vitals filed for this visit.  Subjective Assessment -  04/13/17 0931    Subjective  Reducing the weight to 1# in sidelying for ER helped reduce my soreness.     Currently in Pain?  No/denies    Multiple Pain Sites  No         [facility] PT Assessment - 04/13/17 0001      Strength   Right Shoulder Flexion  3+/5    Right Shoulder ABduction  4-/5    Right Shoulder Internal Rotation  4-/5    Right Shoulder External Rotation  4-/5                  [facility] Adult PT Treatment/Exercise - 04/13/17 0001      Shoulder Exercises: Sidelying   External Rotation  Strengthening;Right;20 reps;Weights    External Rotation Weight (lbs)  1    ABduction  AROM;Strengthening;Right;20 reps;Weights    ABduction Weight (lbs)  1      Shoulder Exercises: Standing   Flexion  AROM;Strengthening;Right;20 reps;Weights 2 sets    Shoulder Flexion Weight (lbs)  2    ABduction  AAROM;Strengthening;Right;20 reps;Weights 1# on wrist for all sets    Shoulder Elevation  -- 2# to second shelf 15x2 min asst with lowering, scaption 20x    Other Standing  Exercises  UE Ranger on #35 20x2, then scaption 10x  No asst needed by PT, added 2 # to wrist    Other Standing Exercises  [place] green 15x2 each      Shoulder Exercises: Pulleys   Flexion  3 minutes Assit pt with end range stretch    ABduction  3 minutes    Other Pulley Exercises  behind the back 3x 10      Shoulder Exercises: ROM/Strengthening   UBE (Upper Arm Bike)  L0 5x5       Moist Heat Therapy   Number Minutes Moist Heat  10 Minutes    Moist Heat Location  Shoulder               PT Short Term Goals - 02/02/17 1457      PT SHORT TERM GOAL #1   Title  independent with initial HEP with her husband helping    Status  Achieved      PT SHORT TERM GOAL #2   Title  understand how to ice the shoulder    Status  Achieved      PT SHORT TERM GOAL #3   Title  pain in right shoulder decreased >/= 25%    Status  Achieved        PT Long Term Goals - 03/30/17 1046      PT LONG TERM GOAL #1    Title  Independent with HEP and how to progress herself    Time  8    Period  Weeks    Status  On-going    Target Date  05/25/17      PT LONG TERM GOAL #2   Title  right shoulder AROM flexion and abduction > or = to 150 deg for ability to reach overhead in cabinets    Time  8    Period  Weeks    Status  Revised    Target Date  05/25/17      PT LONG TERM GOAL #3   Title  right shoulder strength is >/= 4/5 so she is able to perform her work tasks without increase in pain    Baseline  just begining strengthening and is still limited - unable to perform horizontal abduction at this time    Time  8    Period  Weeks    Status  On-going    Target Date  05/25/17      PT LONG TERM GOAL #4   Title  wean from sling and report 50% use of Rt UE as allowed by MD    Time  8    Period  Weeks    Status  Achieved    Target Date  05/25/17      PT LONG TERM GOAL #5   Title  able to lift light object like her purse due to right shoulder strength >/= 4/5 and no restrictions from the MD    Time  8    Period  Weeks    Status  On-going    Target Date  05/25/17      PT LONG TERM GOAL #6   Title  FOTO score </= 54% limitation    Baseline  57% on 10/29    Time  8    Period  Weeks    Status  On-going    Target Date  05/25/17            Plan - 04/13/17 0930    Clinical Impression  Statement  Pt reported reduced soreness as we reduced the weight to 1# for sidelying External Rotation. Pt continues to work on shoulder strength which is significantly improved since evaluation.     Rehab Potential  Excellent    Clinical Impairments Affecting Rehab Potential  Abduction ok, ok to strenghen and increase function, no horizontal abduction    PT Frequency  3x / week    PT Duration  8 weeks    PT Treatment/Interventions  Cryotherapy;Electrical Stimulation;Moist Heat;Ultrasound;Patient/family education;Neuromuscular re-education;Therapeutic exercise;Therapeutic activities;Taping;Vasopneumatic  Device;Passive range of motion;Scar mobilization;Manual techniques    PT Next Visit Plan  strengthening shoulder and RTC progressing slowly, no horizontal abduction per MD precautions verbalized by patient    Consulted and Agree with Plan of Care  Patient       Patient will benefit from skilled therapeutic intervention in order to improve the following deficits and impairments:  Decreased activity tolerance, Decreased mobility, Decreased strength, Increased edema, Decreased scar mobility, Impaired flexibility, Decreased endurance, Decreased coordination, Decreased range of motion, Increased fascial restricitons, Pain  Visit Diagnosis: Acute pain of right shoulder  Muscle weakness (generalized)  Stiffness of right shoulder, not elsewhere classified     Problem List Patient Active Problem List   Diagnosis Date Noted  . Morbid obesity with body mass index (BMI) of 50.0 to 59.9 in adult [facility]) 04/14/2016  . Type 2 diabetes mellitus without complications (HCC) 10/05/2015  . Hypersomnia with sleep apnea 06/20/2015  . Right lumbar radiculopathy 03/13/2014  . Occipital headache 01/03/2014  . 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 Benign paroxysmal positional vertigo 02/03/2013  . Ear pain 03/12/2012  . General medical examination 08/06/2011  . SINUS TACHYCARDIA 08/22/2008  . GERD 08/22/2008  . HYPERLIPIDEMIA, MIXED 05/25/2007  . OBESITY, MORBID 05/25/2007  . Bipolar disorder (HCC) 05/25/2007  . MIGRAINE, CHRONIC 05/25/2007  . Essential hypertension 05/25/2007  . Allergic rhinitis 05/25/2007  . Obstructive sleep apnea 05/17/2007    Kanden Carey, PTA 04/13/2017, 10:06 AM  [facility] [address] [place], [place], [address] Phone: [phone]   Fax:  [phone]  Name: [name] MRN: [id number] Date of Birth: [birth year]

## 2017-04-15 ENCOUNTER — Ambulatory Visit: Payer: 59 | Admitting: Physical Therapy

## 2017-04-15 ENCOUNTER — Encounter: Payer: Self-pay | Admitting: Physical Therapy

## 2017-04-15 DIAGNOSIS — M6281 Muscle weakness (generalized): Secondary | ICD-10-CM | POA: Diagnosis not present

## 2017-04-15 DIAGNOSIS — M25511 Pain in right shoulder: Secondary | ICD-10-CM

## 2017-04-15 DIAGNOSIS — R600 Localized edema: Secondary | ICD-10-CM

## 2017-04-15 DIAGNOSIS — M25611 Stiffness of right shoulder, not elsewhere classified: Secondary | ICD-10-CM

## 2017-04-15 NOTE — Therapy (Signed)
[facility] [address], [address] [place], [place], [address] Phone: [phone]   Fax:  [phone]  Physical Therapy Treatment  Patient Details  Name: [name] MRN: [id number] Date of Birth: March 25, 1971 Referring Provider: Dr. Malon KindleSteven Norris   Encounter Date: 04/15/2017  PT End of Session - 04/15/17 0922    Visit Number  40    Date for PT Re-Evaluation  05/25/17    Authorization Type  UMR    PT Start Time  0921    PT Stop Time  1020    PT Time Calculation (min)  59 min    Activity Tolerance  Patient tolerated treatment well    Behavior During Therapy  [facility] for tasks assessed/performed       Past Medical History:  Diagnosis Date  . Allergy   . Asthma    pt stated treated as ashtma but not really asthma  . Bipolar disorder (HCC)   . Chicken pox   . Complication of anesthesia    [facility]  . Depression   . Diabetes mellitus   . Dyspnea   . GERD (gastroesophageal reflux disease)   . Hyperlipidemia   . Hypertension    readings  . Hypothyroidism   . Irregular heartbeat    saw dr Myrtis SerKatz sees cardiology as needed  . Migraine   . Sleep apnea     Past Surgical History:  Procedure Laterality Date  . ANKLE SURGERY  1989   left  . BACK SURGERY  1999  . CESAREAN SECTION     1995  . LAPAROSCOPIC ASSISTED VAGINAL HYSTERECTOMY  2009     BSO fibroids, DUB, pelvic pain  . 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 SHOULDER ARTHROSCOPY WITH OPEN ROTATOR CUFF REPAIR Right 01/02/2017   Procedure: Right shoulder arthroscopy, A-subcromial decompression, mini open rotator cuff repair, open distal clavicle resection, biceps tenodesis;  Surgeon: Beverely LowNorris, Steve, MD;  [facility];   Service: Orthopedics;  Laterality: Right;  . THYROIDECTOMY, PARTIAL  2001   removed left  . UMBILICAL HERNIA REPAIR  2003  . WISDOM TOOTH EXTRACTION      There were no vitals filed for this visit.  Subjective Assessment - 04/15/17 0924    Subjective  Feeling good today. I can now open heavier doors with more ease.     Currently in Pain?  No/denies    Multiple Pain Sites  No                      [facility] Adult PT Treatment/Exercise - 04/15/17 0001      Shoulder Exercises: Supine   Other Supine Exercises  Supine circles at 90 degrees 20x2  each way      Shoulder Exercises: Sidelying   External Rotation  Strengthening;Right;20 reps;Weights    External Rotation Weight (lbs)  1    ABduction  AROM;Strengthening;Right;20 reps;Weights    ABduction Weight (lbs)  1      Shoulder Exercises: Standing   Flexion  AROM;Strengthening;Right;20 reps;Weights 2 sets    Shoulder Flexion Weight (lbs)  2    ABduction  AAROM;Strengthening;Right;20 reps;Weights 1# on wrist for all sets    Shoulder ABduction Weight (lbs)  Begun abduction up wall, take arm off wall to hold 2  sec, then lower on wall     Shoulder Elevation  -- 2# to second shelf 15x2 min asst with lowering, scaption 20x    Other Standing Exercises  UE Ranger on #35 20x2, then scaption 10x  No asst needed by PT, added 2 # to wrist    Other Standing Exercises  [place] green 15x2 each      Shoulder Exercises: Pulleys   Flexion  3 minutes Assit pt with end range stretch    ABduction  3 minutes    Other Pulley Exercises  behind the back 3x 10      Shoulder Exercises: ROM/Strengthening   UBE (Upper Arm Bike)  L0 5x5       Moist Heat Therapy   Number Minutes Moist Heat  10 Minutes    Moist Heat Location  Shoulder               PT Short Term Goals - 02/02/17 1457      PT SHORT TERM GOAL #1   Title  independent with initial HEP with her husband helping    Status  Achieved      PT SHORT TERM GOAL #2   Title   understand how to ice the shoulder    Status  Achieved      PT SHORT TERM GOAL #3   Title  pain in right shoulder decreased >/= 25%    Status  Achieved        PT Long Term Goals - 03/30/17 1046      PT LONG TERM GOAL #1   Title  Independent with HEP and how to progress herself    Time  8    Period  Weeks    Status  On-going    Target Date  05/25/17      PT LONG TERM GOAL #2   Title  right shoulder AROM flexion and abduction > or = to 150 deg for ability to reach overhead in cabinets    Time  8    Period  Weeks    Status  Revised    Target Date  05/25/17      PT LONG TERM GOAL #3   Title  right shoulder strength is >/= 4/5 so she is able to perform her work tasks without increase in pain    Baseline  just begining strengthening and is still limited - unable to perform horizontal abduction at this time    Time  8    Period  Weeks    Status  On-going    Target Date  05/25/17      PT LONG TERM GOAL #4   Title  wean from sling and report 50% use of Rt UE as allowed by MD    Time  8    Period  Weeks    Status  Achieved    Target Date  05/25/17      PT LONG TERM GOAL #5   Title  able to lift light object like her purse due to right shoulder strength >/= 4/5 and no restrictions from the MD    Time  8    Period  Weeks    Status  On-going    Target Date  05/25/17      PT LONG TERM GOAL #6   Title  FOTO score </= 54% limitation    Baseline  57% on 10/29    Time  8    Period  Weeks    Status  On-going    Target Date  05/25/17            Plan - 04/15/17 1610    Clinical Impression Statement  Demonstrates ability to lift weights higher, using more of her available ROM. Soreness staying at lower end. Pt reports opening doors, especially heavy doors, is getting much easier to da.     Rehab Potential  Excellent    Clinical Impairments Affecting Rehab Potential  Abduction ok, ok to strenghen and increase function, no horizontal abduction, add lat pull down next  session.    PT Frequency  3x / week    PT Duration  8 weeks    PT Treatment/Interventions  Cryotherapy;Electrical Stimulation;Moist Heat;Ultrasound;Patient/family education;Neuromuscular re-education;Therapeutic exercise;Therapeutic activities;Taping;Vasopneumatic Device;Passive range of motion;Scar mobilization;Manual techniques    PT Next Visit Plan  strengthening shoulder and RTC progressing slowly, no horizontal abduction per MD precautions verbalized by patient    Consulted and Agree with Plan of Care  Patient       Patient will benefit from skilled therapeutic intervention in order to improve the following deficits and impairments:  Decreased activity tolerance, Decreased mobility, Decreased strength, Increased edema, Decreased scar mobility, Impaired flexibility, Decreased endurance, Decreased coordination, Decreased range of motion, Increased fascial restricitons, Pain  Visit Diagnosis: Acute pain of right shoulder  Stiffness of right shoulder, not elsewhere classified  Localized edema  Muscle weakness (generalized)     Problem List Patient Active Problem List   Diagnosis Date Noted  . Morbid obesity with body mass index (BMI) of 50.0 to 59.9 in adult [facility]) 04/14/2016  . Type 2 diabetes mellitus without complications (HCC) 10/05/2015  . Hypersomnia with sleep apnea 06/20/2015  . Right lumbar radiculopathy 03/13/2014  . Occipital headache 01/03/2014  . 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 Benign paroxysmal positional vertigo 02/03/2013  . Ear pain 03/12/2012  . General medical examination 08/06/2011  . SINUS TACHYCARDIA 08/22/2008  . GERD 08/22/2008  . HYPERLIPIDEMIA, MIXED 05/25/2007  . OBESITY, MORBID 05/25/2007  . Bipolar disorder (HCC) 05/25/2007  . MIGRAINE, CHRONIC 05/25/2007  . Essential hypertension 05/25/2007  . Allergic rhinitis 05/25/2007  . Obstructive sleep apnea 05/17/2007    COCHRAN,JENNIFER, PTA 04/15/2017, 10:09 AM  [facility] [address], [address] [place], [place], [address] Phone: [phone]   Fax:  [phone]  Name: Alexanderia Gorby MRN: [id number] Date of Birth: 16-Aug-1970

## 2017-04-17 ENCOUNTER — Encounter: Payer: Self-pay | Admitting: Physical Therapy

## 2017-04-20 ENCOUNTER — Ambulatory Visit: Payer: 59 | Admitting: Physical Therapy

## 2017-04-20 ENCOUNTER — Encounter: Payer: Self-pay | Admitting: Physical Therapy

## 2017-04-20 DIAGNOSIS — R600 Localized edema: Secondary | ICD-10-CM

## 2017-04-20 DIAGNOSIS — M25511 Pain in right shoulder: Secondary | ICD-10-CM | POA: Diagnosis not present

## 2017-04-20 DIAGNOSIS — M6281 Muscle weakness (generalized): Secondary | ICD-10-CM

## 2017-04-20 DIAGNOSIS — M25611 Stiffness of right shoulder, not elsewhere classified: Secondary | ICD-10-CM | POA: Diagnosis not present

## 2017-04-20 NOTE — Therapy (Signed)
[facility] [address] [place], [place], [address] Phone: [phone]   Fax:  [phone]  Physical Therapy Treatment  Patient Details  Name: [name] MRN: [id number] Date of Birth: [birth year] Referring Provider: Dr. Malon Kindle   Encounter Date: 04/20/2017  PT End of Session - 04/20/17 0932    Visit Number  41    Date for PT Re-Evaluation  05/25/17    Authorization Type  UMR    PT Start Time  0925    PT Stop Time  1013    PT Time Calculation (min)  48 min    Activity Tolerance  Patient tolerated treatment well    Behavior During Therapy  [facility] for tasks assessed/performed       Past Medical History:  Diagnosis Date  . Allergy   . Asthma    pt stated treated as ashtma but not really asthma  . Bipolar disorder (HCC)   . Chicken pox   . Complication of anesthesia    oxygen saturation dropped after hysterectomy 2009 at [facility]  . Depression   . Diabetes mellitus   . Dyspnea   . GERD (gastroesophageal reflux disease)   . Hyperlipidemia   . Hypertension    readings  . Hypothyroidism   . Irregular heartbeat    saw dr Myrtis Ser sees cardiology as needed  . Migraine   . Sleep apnea     Past Surgical History:  Procedure Laterality Date  . ANKLE SURGERY  1989   left  . BACK SURGERY  1999  . CESAREAN SECTION     1995  . LAPAROSCOPIC ASSISTED VAGINAL HYSTERECTOMY  2009     BSO fibroids, DUB, pelvic pain  . 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 SHOULDER ARTHROSCOPY WITH OPEN ROTATOR CUFF REPAIR Right 01/02/2017   Procedure: Right shoulder arthroscopy, A-subcromial decompression, mini open rotator cuff repair, open distal clavicle resection, biceps tenodesis;  Surgeon: Beverely Low, MD;  Location: [facility] OR;   Service: Orthopedics;  Laterality: Right;  . THYROIDECTOMY, PARTIAL  2001   removed left  . UMBILICAL HERNIA REPAIR  2003  . WISDOM TOOTH EXTRACTION      There were no vitals filed for this visit.  Subjective Assessment - 04/20/17 0934    Subjective  A little sore today, no idea why?    Currently in Pain?  Yes    Pain Score  2     Pain Location  Shoulder    Pain Orientation  Right    Pain Descriptors / Indicators  Sore    Aggravating Factors   weather ?    Pain Relieving Factors  heat    Multiple Pain Sites  No         [facility] PT Assessment - 04/20/17 0001      AROM   Right Shoulder Flexion  120 Degrees                  [facility] Adult PT Treatment/Exercise - 04/20/17 0001      Shoulder Exercises: Supine   Other Supine Exercises  Supine circles at 90 degrees 20x2  each way Attempted yellow but this was too heavy      Shoulder Exercises: Sidelying   External Rotation  Strengthening;Right;20 reps;Weights    External Rotation Weight (lbs)  1    ABduction  AROM;Strengthening;Right;20 reps;Weights    ABduction Weight (lbs)  1 1/2       Shoulder Exercises: Standing   Flexion  AROM;Strengthening;Right;20 reps;Weights 2 sets    Shoulder Flexion Weight (lbs)  2    Shoulder Elevation  -- 2# to second shelf 15x2 min asst with lowering, scaption 20x    Other Standing Exercises  UE Ranger on #35 20x2, then scaption 10x  No asst needed by PT, added 2 # to wrist    Other Standing Exercises  [place] green 15x2 each      Shoulder Exercises: Pulleys   Flexion  3 minutes Assit pt with end range stretch    ABduction  3 minutes    Other Pulley Exercises  behind the back 3x 10      Shoulder Exercises: ROM/Strengthening   UBE (Upper Arm Bike)  L0 5x5       Moist Heat Therapy   Number Minutes Moist Heat  10 Minutes    Moist Heat Location  Shoulder               PT Short Term Goals - 02/02/17 1457      PT SHORT TERM GOAL #1   Title  independent with initial HEP  with her husband helping    Status  Achieved      PT SHORT TERM GOAL #2   Title  understand how to ice the shoulder    Status  Achieved      PT SHORT TERM GOAL #3   Title  pain in right shoulder decreased >/= 25%    Status  Achieved        PT Long Term Goals - 03/30/17 1046      PT LONG TERM GOAL #1   Title  Independent with HEP and how to progress herself    Time  8    Period  Weeks    Status  On-going    Target Date  05/25/17      PT LONG TERM GOAL #2   Title  right shoulder AROM flexion and abduction > or = to 150 deg for ability to reach overhead in cabinets    Time  8    Period  Weeks    Status  Revised    Target Date  05/25/17      PT LONG TERM GOAL #3   Title  right shoulder strength is >/= 4/5 so she is able to perform her work tasks without increase in pain    Baseline  just begining strengthening and is still limited - unable to perform horizontal abduction at this time    Time  8    Period  Weeks    Status  On-going    Target Date  05/25/17      PT LONG TERM GOAL #4   Title  wean from sling and report 50% use of Rt UE as allowed by MD    Time  8    Period  Weeks    Status  Achieved    Target Date  05/25/17      PT LONG TERM GOAL #5   Title  able to lift light object like her purse due to right shoulder strength >/= 4/5 and no restrictions from the MD    Time  8    Period  Weeks    Status  On-going    Target Date  05/25/17      PT  LONG TERM GOAL #6   Title  FOTO score </= 54% limitation    Baseline  57% on 10/29    Time  8    Period  Weeks    Status  On-going    Target Date  05/25/17            Plan - 04/20/17 0932    Clinical Impression Statement  Attempted so heavier options: some ok like on sidelying abduction, but yellow ball in supine was too heavy for cirlces. Pt has improved AROm in flexion today measuring her best at 120 degrees.     Rehab Potential  Excellent    Clinical Impairments Affecting Rehab Potential  Abduction ok, ok to  strenghen and increase function, no horizontal abduction, add lat pull down next session. Consider revising LTG of 150 degrees flexion, pt more on track fr 130 degrees by DC date.    PT Frequency  3x / week    PT Duration  8 weeks    PT Treatment/Interventions  Cryotherapy;Electrical Stimulation;Moist Heat;Ultrasound;Patient/family education;Neuromuscular re-education;Therapeutic exercise;Therapeutic activities;Taping;Vasopneumatic Device;Passive range of motion;Scar mobilization;Manual techniques    PT Next Visit Plan  strengthening shoulder and RTC progressing slowly, no horizontal abduction per MD precautions verbalized by patient    Consulted and Agree with Plan of Care  Patient       Patient will benefit from skilled therapeutic intervention in order to improve the following deficits and impairments:  Decreased activity tolerance, Decreased mobility, Decreased strength, Increased edema, Decreased scar mobility, Impaired flexibility, Decreased endurance, Decreased coordination, Decreased range of motion, Increased fascial restricitons, Pain  Visit Diagnosis: Acute pain of right shoulder  Stiffness of right shoulder, not elsewhere classified  Localized edema  Muscle weakness (generalized)     Problem List Patient Active Problem List   Diagnosis Date Noted  . Morbid obesity with body mass index (BMI) of 50.0 to 59.9 in adult [facility](HCC) 04/14/2016  . Type 2 diabetes mellitus without complications (HCC) 10/05/2015  . Hypersomnia with sleep apnea 06/20/2015  . Right lumbar radiculopathy 03/13/2014  . Occipital headache 01/03/2014  . 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 Benign paroxysmal positional vertigo 02/03/2013  . Ear pain 03/12/2012  . General medical examination 08/06/2011  . SINUS TACHYCARDIA 08/22/2008  . GERD 08/22/2008  . HYPERLIPIDEMIA, MIXED 05/25/2007  . OBESITY, MORBID 05/25/2007  . Bipolar disorder (HCC) 05/25/2007  . MIGRAINE, CHRONIC 05/25/2007  . Essential  hypertension 05/25/2007  . Allergic rhinitis 05/25/2007  . Obstructive sleep apnea 05/17/2007    [name], PTA 04/20/2017, 10:13 AM  [facility] [address], [address] [place], [place], [address] Phone: [phone]   Fax:  [phone]  Name: [name] MRN: [id number] Date of Birth: 01-23-1971

## 2017-04-22 ENCOUNTER — Encounter: Payer: Self-pay | Admitting: Physical Therapy

## 2017-04-22 ENCOUNTER — Ambulatory Visit: Payer: 59 | Admitting: Physical Therapy

## 2017-04-22 DIAGNOSIS — M25511 Pain in right shoulder: Secondary | ICD-10-CM | POA: Diagnosis not present

## 2017-04-22 DIAGNOSIS — R600 Localized edema: Secondary | ICD-10-CM

## 2017-04-22 DIAGNOSIS — M6281 Muscle weakness (generalized): Secondary | ICD-10-CM | POA: Diagnosis not present

## 2017-04-22 DIAGNOSIS — M25611 Stiffness of right shoulder, not elsewhere classified: Secondary | ICD-10-CM

## 2017-04-22 NOTE — Therapy (Signed)
[facility] [address], [address] [place], [place], [address] Phone: [phone]   Fax:  [phone]  Physical Therapy Treatment  Patient Details  Name: [name] MRN: [id number] Date of Birth: 1971/01/08 Referring Provider: Dr. Malon KindleSteven Norris   Encounter Date: 04/22/2017  PT End of Session - 04/22/17 0958    Visit Number  42    Date for PT Re-Evaluation  05/25/17    Authorization Type  UMR    PT Start Time  0925    PT Stop Time  1015    PT Time Calculation (min)  50 min    Activity Tolerance  Patient tolerated treatment well    Behavior During Therapy  [facility] for tasks assessed/performed       Past Medical History:  Diagnosis Date  . Allergy   . Asthma    pt stated treated as ashtma but not really asthma  . Bipolar disorder (HCC)   . Chicken pox   . Complication of anesthesia    [facility]  . Depression   . Diabetes mellitus   . Dyspnea   . GERD (gastroesophageal reflux disease)   . Hyperlipidemia   . Hypertension    readings  . Hypothyroidism   . Irregular heartbeat    saw dr Myrtis SerKatz sees cardiology as needed  . Migraine   . Sleep apnea     Past Surgical History:  Procedure Laterality Date  . ANKLE SURGERY  1989   left  . BACK SURGERY  1999  . CESAREAN SECTION     1995  . LAPAROSCOPIC ASSISTED VAGINAL HYSTERECTOMY  2009     BSO fibroids, DUB, pelvic pain  . 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 SHOULDER ARTHROSCOPY WITH OPEN ROTATOR CUFF REPAIR Right 01/02/2017   Procedure: Right shoulder arthroscopy, A-subcromial decompression, mini open rotator cuff repair, open distal clavicle resection, biceps tenodesis;  Surgeon: Beverely LowNorris, Steve, MD;  [facility];   Service: Orthopedics;  Laterality: Right;  . THYROIDECTOMY, PARTIAL  2001   removed left  . UMBILICAL HERNIA REPAIR  2003  . WISDOM TOOTH EXTRACTION      There were no vitals filed for this visit.  Subjective Assessment - 04/22/17 0928    Subjective  Pt denies pain this morning. Pt states  she has had some light pain at work, but it went away and in general is doing well.      Patient Stated Goals  reduce pain and improve mobility and strength    Currently in Pain?  No/denies                      [facility] Adult [facility] Treatment/Exercise - 04/22/17 0001      Shoulder Exercises: Seated   Other Seated Exercises  standing overhead shoulder elevation 1lb 10x; 2lb 10 (PT hand there in case needing assistance)      Shoulder Exercises: Sidelying   External Rotation  Strengthening;Right;20 reps;Weights    External Rotation Weight (lbs)  1    ABduction  AROM;Strengthening;Right;20 reps;Weights    ABduction Weight (lbs)  1 1/2       Shoulder Exercises: Standing   Flexion  AROM;Strengthening;Right;20 reps;Weights 2 sets    Shoulder Flexion Weight (lbs)  1 & 2  Shoulder Elevation  Strengthening;Right;Standing flexion 2# to second shelf 15x2; scaption 2#20x    Other Standing Exercises  UE Ranger on #35 20x2, then scaption 10x  No asst needed by PT, added 2 # to wrist    Other Standing Exercises  [place] green 15x2 each      Shoulder Exercises: Pulleys   Flexion  3 minutes Assit pt with end range stretch    ABduction  3 minutes    Other Pulley Exercises  behind the back 3x 10      Shoulder Exercises: ROM/Strengthening   UBE (Upper Arm Bike)  L0 5x5       Moist Heat Therapy   Number Minutes Moist Heat  10 Minutes    Moist Heat Location  Shoulder               PT Short Term Goals - 02/02/17 1457      PT SHORT TERM GOAL #1   Title  independent with initial HEP with her husband helping    Status  Achieved      PT SHORT TERM GOAL #2   Title  understand how to ice  the shoulder    Status  Achieved      PT SHORT TERM GOAL #3   Title  pain in right shoulder decreased >/= 25%    Status  Achieved        PT Long Term Goals - 04/22/17 1000      PT LONG TERM GOAL #1   Title  Independent with HEP and how to progress herself    Time  8    Period  Weeks    Status  On-going      PT LONG TERM GOAL #2   Title  right shoulder AROM flexion and abduction > or = to 150 deg for ability to reach overhead in cabinets    Time  8    Period  Weeks    Status  On-going      PT LONG TERM GOAL #3   Title  right shoulder strength is >/= 4/5 so she is able to perform her work tasks without increase in pain    Time  8    Period  Weeks    Status  On-going      PT LONG TERM GOAL #4   Title  wean from sling and report 50% use of Rt UE as allowed by MD    Time  8    Period  Weeks    Status  Achieved      PT LONG TERM GOAL #5   Title  able to lift light object like her purse due to right shoulder strength >/= 4/5 and no restrictions from the MD    Time  8    Period  Weeks    Status  On-going      PT LONG TERM GOAL #6   Title  FOTO score </= 54% limitation    Time  8    Period  Weeks    Status  On-going            Plan - 04/22/17 1610    Clinical Impression Statement  Pt increased weigth with ranger exercises and was able to perform lifting to shelf and overhead with 1-2lb weight no assistance from PT.  Pt continues to demonstrate strength and ROM gains and continues to benefit from skilled PT.     Rehab Potential  Excellent    Clinical Impairments  Affecting Rehab Potential  Abduction ok, ok to strenghen and increase function, no horizontal abduction, add lat pull down next session. Consider revising LTG of 150 degrees flexion, pt more on track fr 130 degrees by DC date.    PT Treatment/Interventions  Cryotherapy;Electrical Stimulation;Moist Heat;Ultrasound;Patient/family education;Neuromuscular re-education;Therapeutic exercise;Therapeutic  activities;Taping;Vasopneumatic Device;Passive range of motion;Scar mobilization;Manual techniques    PT Next Visit Plan  strengthening shoulder and RTC progressing slowly, no horizontal abduction per MD precautions verbalized by patient    Consulted and Agree with Plan of Care  Patient       Patient will benefit from skilled therapeutic intervention in order to improve the following deficits and impairments:  Decreased activity tolerance, Decreased mobility, Decreased strength, Increased edema, Decreased scar mobility, Impaired flexibility, Decreased endurance, Decreased coordination, Decreased range of motion, Increased fascial restricitons, Pain  Visit Diagnosis: Acute pain of right shoulder  Stiffness of right shoulder, not elsewhere classified  Localized edema  Muscle weakness (generalized)     Problem List Patient Active Problem List   Diagnosis Date Noted  . Morbid obesity with body mass index (BMI) of 50.0 to 59.9 in adult [facility](HCC) 04/14/2016  . Type 2 diabetes mellitus without complications (HCC) 10/05/2015  . Hypersomnia with sleep apnea 06/20/2015  . Right lumbar radiculopathy 03/13/2014  . Occipital headache 01/03/2014  . 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 Benign paroxysmal positional vertigo 02/03/2013  . Ear pain 03/12/2012  . General medical examination 08/06/2011  . SINUS TACHYCARDIA 08/22/2008  . GERD 08/22/2008  . HYPERLIPIDEMIA, MIXED 05/25/2007  . OBESITY, MORBID 05/25/2007  . Bipolar disorder (HCC) 05/25/2007  . MIGRAINE, CHRONIC 05/25/2007  . Essential hypertension 05/25/2007  . Allergic rhinitis 05/25/2007  . Obstructive sleep apnea 05/17/2007    Vincente PoliJakki Crosser, PT 04/22/2017, 10:09 AM  [facility] [address], [address] [place], [place], [address] Phone: [phone]   Fax:  [phone]  Name: [name] MRN: [id number] Date of Birth: 02-04-1971

## 2017-04-23 DIAGNOSIS — E669 Obesity, unspecified: Secondary | ICD-10-CM | POA: Diagnosis not present

## 2017-04-23 DIAGNOSIS — E119 Type 2 diabetes mellitus without complications: Secondary | ICD-10-CM | POA: Diagnosis not present

## 2017-04-23 DIAGNOSIS — Z9884 Bariatric surgery status: Secondary | ICD-10-CM | POA: Diagnosis not present

## 2017-04-24 ENCOUNTER — Encounter: Payer: Self-pay | Admitting: Physical Therapy

## 2017-04-24 ENCOUNTER — Ambulatory Visit: Payer: 59 | Admitting: Physical Therapy

## 2017-04-24 DIAGNOSIS — M6281 Muscle weakness (generalized): Secondary | ICD-10-CM | POA: Diagnosis not present

## 2017-04-24 DIAGNOSIS — M25611 Stiffness of right shoulder, not elsewhere classified: Secondary | ICD-10-CM

## 2017-04-24 DIAGNOSIS — M25511 Pain in right shoulder: Secondary | ICD-10-CM | POA: Diagnosis not present

## 2017-04-24 DIAGNOSIS — R600 Localized edema: Secondary | ICD-10-CM | POA: Diagnosis not present

## 2017-04-24 NOTE — Therapy (Signed)
[facility] [address], [address] [place], [place], [address] Phone: [phone]   Fax:  [phone]  Physical Therapy Treatment  Patient Details  Name: Crystal Guadeloupeammy Willmon MRN: [id number] Date of Birth: September 19, 1970 Referring Provider: Dr. Malon KindleSteven Norris   Encounter Date: 04/24/2017  PT End of Session - 04/24/17 0931    Visit Number  43    Date for PT Re-Evaluation  05/25/17    Authorization Type  UMR    PT Start Time  0925    PT Stop Time  1015    PT Time Calculation (min)  50 min    Activity Tolerance  Patient tolerated treatment well    Behavior During Therapy  [facility] for tasks assessed/performed       Past Medical History:  Diagnosis Date  . Allergy   . Asthma    pt stated treated as ashtma but not really asthma  . Bipolar disorder (HCC)   . Chicken pox   . Complication of anesthesia    [facility]  . Depression   . Diabetes mellitus   . Dyspnea   . GERD (gastroesophageal reflux disease)   . Hyperlipidemia   . Hypertension    readings  . Hypothyroidism   . Irregular heartbeat    saw dr Myrtis SerKatz sees cardiology as needed  . Migraine   . Sleep apnea     Past Surgical History:  Procedure Laterality Date  . ANKLE SURGERY  1989   left  . BACK SURGERY  1999  . CESAREAN SECTION     1995  . LAPAROSCOPIC ASSISTED VAGINAL HYSTERECTOMY  2009     BSO fibroids, DUB, pelvic pain  . 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 SHOULDER ARTHROSCOPY WITH OPEN ROTATOR CUFF REPAIR Right 01/02/2017   Procedure: Right shoulder arthroscopy, A-subcromial decompression, mini open rotator cuff repair, open distal clavicle resection, biceps tenodesis;  Surgeon: Beverely LowNorris, Steve, MD;  [facility];   Service: Orthopedics;  Laterality: Right;  . THYROIDECTOMY, PARTIAL  2001   removed left  . UMBILICAL HERNIA REPAIR  2003  . WISDOM TOOTH EXTRACTION      There were no vitals filed for this visit.  Subjective Assessment - 04/24/17 0933    Subjective  No new complaints this AM.     Currently in Pain?  No/denies    Multiple Pain Sites  No         [facility] PT Assessment - 04/24/17 0001      AROM   Right Shoulder Flexion  120 Degrees                  [facility] Adult PT Treatment/Exercise - 04/24/17 0001      Shoulder Exercises: Supine   Other Supine Exercises  Supine circles at 90 degrees 20x2  each way Attempted yellow but this was too heavy      Shoulder Exercises: Sidelying   External Rotation  Strengthening;Right;20 reps;Weights    External Rotation Weight (lbs)  1    ABduction  Strengthening;Left 3x10    ABduction Weight (lbs)  2      Shoulder Exercises: Standing   Flexion  AROM;Strengthening;Right;20 reps;Weights 2 sets    Shoulder Flexion Weight (lbs)  1    ABduction  -- Up wall,  hold off wall, slide down wall 2x10 1 1/2# on wrist    Shoulder Elevation  Strengthening;Right;Standing flexion 2# to second shelf 15x2; scaption 2#20x    Other Standing Exercises  UE Ranger on #35 20x2, then scaption 10x  2# stil but worked 1/2 way to overhead only    Other Standing Exercises  [place] green 15x2 each      Shoulder Exercises: Pulleys   Flexion  3 minutes Assit pt with end range stretch    ABduction  3 minutes    Other Pulley Exercises  behind the back 3x 10      Shoulder Exercises: ROM/Strengthening   UBE (Upper Arm Bike)  L0 5x5       Moist Heat Therapy   Number Minutes Moist Heat  10 Minutes    Moist Heat Location  Shoulder               PT Short Term Goals - 02/02/17 1457      PT SHORT TERM GOAL #1   Title  independent with initial HEP with her husband helping    Status  Achieved      PT SHORT TERM GOAL #2   Title  understand how to ice the  shoulder    Status  Achieved      PT SHORT TERM GOAL #3   Title  pain in right shoulder decreased >/= 25%    Status  Achieved        PT Long Term Goals - 04/22/17 1000      PT LONG TERM GOAL #1   Title  Independent with HEP and how to progress herself    Time  8    Period  Weeks    Status  On-going      PT LONG TERM GOAL #2   Title  right shoulder AROM flexion and abduction > or = to 150 deg for ability to reach overhead in cabinets    Time  8    Period  Weeks    Status  On-going      PT LONG TERM GOAL #3   Title  right shoulder strength is >/= 4/5 so she is able to perform her work tasks without increase in pain    Time  8    Period  Weeks    Status  On-going      PT LONG TERM GOAL #4   Title  wean from sling and report 50% use of Rt UE as allowed by MD    Time  8    Period  Weeks    Status  Achieved      PT LONG TERM GOAL #5   Title  able to lift light object like her purse due to right shoulder strength >/= 4/5 and no restrictions from the MD    Time  8    Period  Weeks    Status  On-going      PT LONG TERM GOAL #6   Title  FOTO score </= 54% limitation    Time  8    Period  Weeks    Status  On-going            Plan - 04/24/17 0931    Clinical Impression Statement  Pt reports she can now wash dishes due to her improved strength and visably is able to stretch her green band out farther during her exercises. We worked the upper quarter of her flexion ROm to strength specifically overhead  today on the UE ranger. This was difficult and fatiguing to the shoulder.     Rehab Potential  Excellent    Clinical Impairments Affecting Rehab Potential  Abduction ok, ok to strenghen and increase function, no horizontal abduction, add lat pull down next session. Consider revising LTG of 150 degrees flexion, pt more on track fr 130 degrees by DC date.    PT Frequency  3x / week    PT Duration  8 weeks    PT Treatment/Interventions  Cryotherapy;Electrical  Stimulation;Moist Heat;Ultrasound;Patient/family education;Neuromuscular re-education;Therapeutic exercise;Therapeutic activities;Taping;Vasopneumatic Device;Passive range of motion;Scar mobilization;Manual techniques    PT Next Visit Plan  strengthening shoulder and RTC progressing slowly, no horizontal abduction per MD precautions verbalized by patient    Consulted and Agree with Plan of Care  Patient       Patient will benefit from skilled therapeutic intervention in order to improve the following deficits and impairments:  Decreased activity tolerance, Decreased mobility, Decreased strength, Increased edema, Decreased scar mobility, Impaired flexibility, Decreased endurance, Decreased coordination, Decreased range of motion, Increased fascial restricitons, Pain  Visit Diagnosis: Acute pain of right shoulder  Stiffness of right shoulder, not elsewhere classified  Muscle weakness (generalized)     Problem List Patient Active Problem List   Diagnosis Date Noted  . Morbid obesity with body mass index (BMI) of 50.0 to 59.9 in adult [facility]) 04/14/2016  . Type 2 diabetes mellitus without complications (HCC) 10/05/2015  . Hypersomnia with sleep apnea 06/20/2015  . Right lumbar radiculopathy 03/13/2014  . Occipital headache 01/03/2014  . 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 Benign paroxysmal positional vertigo 02/03/2013  . Ear pain 03/12/2012  . General medical examination 08/06/2011  . SINUS TACHYCARDIA 08/22/2008  . GERD 08/22/2008  . HYPERLIPIDEMIA, MIXED 05/25/2007  . OBESITY, MORBID 05/25/2007  . Bipolar disorder (HCC) 05/25/2007  . MIGRAINE, CHRONIC 05/25/2007  . Essential hypertension 05/25/2007  . Allergic rhinitis 05/25/2007  . Obstructive sleep apnea 05/17/2007    Leonore Frankson, PTA 04/24/2017, 10:05 AM  [facility] [address], [address] [place], [place], [address] Phone: [phone]   Fax:   [phone]  Name: Gregg Holster MRN: [id number] Date of Birth: 06-05-1970

## 2017-04-27 ENCOUNTER — Ambulatory Visit: Payer: 59 | Attending: Orthopedic Surgery | Admitting: Physical Therapy

## 2017-04-27 ENCOUNTER — Encounter: Payer: Self-pay | Admitting: Physical Therapy

## 2017-04-27 DIAGNOSIS — M6281 Muscle weakness (generalized): Secondary | ICD-10-CM | POA: Insufficient documentation

## 2017-04-27 DIAGNOSIS — M25511 Pain in right shoulder: Secondary | ICD-10-CM | POA: Insufficient documentation

## 2017-04-27 DIAGNOSIS — M25611 Stiffness of right shoulder, not elsewhere classified: Secondary | ICD-10-CM | POA: Insufficient documentation

## 2017-04-27 DIAGNOSIS — R600 Localized edema: Secondary | ICD-10-CM | POA: Insufficient documentation

## 2017-04-27 DIAGNOSIS — Z4789 Encounter for other orthopedic aftercare: Secondary | ICD-10-CM | POA: Diagnosis not present

## 2017-04-27 NOTE — Therapy (Signed)
[facility] [address], [address] [place], [place], [address] Phone: [phone]   Fax:  [phone]  Physical Therapy Treatment  Patient Details  Name: [name] MRN: [id number] Date of Birth: March 28, 1971 Referring Provider: Dr. Malon KindleSteven Norris   Encounter Date: 04/27/2017  PT End of Session - 04/27/17 0921    Visit Number  44    Date for PT Re-Evaluation  05/25/17    Authorization Type  UMR    PT Start Time  0921    PT Stop Time  1013    PT Time Calculation (min)  52 min    Activity Tolerance  Patient tolerated treatment well    Behavior During Therapy  [facility] for tasks assessed/performed       Past Medical History:  Diagnosis Date  . Allergy   . Asthma    pt stated treated as ashtma but not really asthma  . Bipolar disorder (HCC)   . Chicken pox   . Complication of anesthesia    [facility]  . Depression   . Diabetes mellitus   . Dyspnea   . GERD (gastroesophageal reflux disease)   . Hyperlipidemia   . Hypertension    readings  . Hypothyroidism   . Irregular heartbeat    saw dr Myrtis SerKatz sees cardiology as needed  . Migraine   . Sleep apnea     Past Surgical History:  Procedure Laterality Date  . ANKLE SURGERY  1989   left  . BACK SURGERY  1999  . CESAREAN SECTION     1995  . LAPAROSCOPIC ASSISTED VAGINAL HYSTERECTOMY  2009     BSO fibroids, DUB, pelvic pain  . 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 SHOULDER ARTHROSCOPY WITH OPEN ROTATOR CUFF REPAIR Right 01/02/2017   Procedure: Right shoulder arthroscopy, A-subcromial decompression, mini open rotator cuff repair, open distal clavicle resection, biceps tenodesis;  Surgeon: Beverely LowNorris, Steve, MD;  [facility];   Service: Orthopedics;  Laterality: Right;  . THYROIDECTOMY, PARTIAL  2001   removed left  . UMBILICAL HERNIA REPAIR  2003  . WISDOM TOOTH EXTRACTION      There were no vitals filed for this visit.  Subjective Assessment - 04/27/17 0924    Subjective  Feels good! even with the recent rain it feels good.    Currently in Pain?  No/denies    Multiple Pain Sites  No                      [facility] Adult PT Treatment/Exercise - 04/27/17 0001      Shoulder Exercises: Supine   Other Supine Exercises  Supine circles at 90 degrees 20x2  each way Attempted yellow but this was too heavy      Shoulder Exercises: Sidelying   External Rotation  Strengthening;Right;20 reps;Weights    External Rotation Weight (lbs)  1    ABduction  Strengthening;Left 3x10    ABduction Weight (lbs)  2      Shoulder Exercises: Standing   Flexion  AROM;Strengthening;Right;20 reps;Weights 2 sets    Theraband Level (Shoulder Flexion)  -- worked on upper end range of flexion last 2 sets: 3x10    Shoulder Flexion Weight (lbs)  2    ABduction  -- Up wall,  hold off wall, slide down wall 2x10 1 1/2# on wrist    Shoulder Elevation  Strengthening;Right;Standing flexion 2# to second shelf 15x2; scaption 2#20x    Other Standing Exercises  UE Ranger on #35 20x2, then scaption 10x  2# stil but worked 1/2 way to overhead only    Other Standing Exercises  [place] green 15x2 each      Shoulder Exercises: Pulleys   Flexion  3 minutes Assit pt with end range stretch    ABduction  3 minutes    Other Pulley Exercises  behind the back 3x 10      Shoulder Exercises: ROM/Strengthening   UBE (Upper Arm Bike)  L1 5x5       Moist Heat Therapy   Number Minutes Moist Heat  10 Minutes    Moist Heat Location  Shoulder               PT Short Term Goals - 02/02/17 1457      PT SHORT TERM GOAL #1   Title  independent with initial HEP with her husband helping    Status  Achieved      PT SHORT TERM GOAL #2    Title  understand how to ice the shoulder    Status  Achieved      PT SHORT TERM GOAL #3   Title  pain in right shoulder decreased >/= 25%    Status  Achieved        PT Long Term Goals - 04/22/17 1000      PT LONG TERM GOAL #1   Title  Independent with HEP and how to progress herself    Time  8    Period  Weeks    Status  On-going      PT LONG TERM GOAL #2   Title  right shoulder AROM flexion and abduction > or = to 150 deg for ability to reach overhead in cabinets    Time  8    Period  Weeks    Status  On-going      PT LONG TERM GOAL #3   Title  right shoulder strength is >/= 4/5 so she is able to perform her work tasks without increase in pain    Time  8    Period  Weeks    Status  On-going      PT LONG TERM GOAL #4   Title  wean from sling and report 50% use of Rt UE as allowed by MD    Time  8    Period  Weeks    Status  Achieved      PT LONG TERM GOAL #5   Title  able to lift light object like her purse due to right shoulder strength >/= 4/5 and no restrictions from the MD    Time  8    Period  Weeks    Status  On-going      PT LONG TERM GOAL #6   Title  FOTO score </= 54% limitation    Time  8    Period  Weeks    Status  On-going            Plan - 04/27/17 [id number]    Clinical Impression Statement  Continue to work on upper/end range of her shoulder strength. Still challenging due to weakness in that range, but improving.     Rehab Potential  Excellent    Clinical Impairments Affecting Rehab Potential  Abduction ok,  ok to strenghen and increase function, no horizontal abduction, add lat pull down next session. Consider revising LTG of 150 degrees flexion, pt more on track fr 130 degrees by DC date.    PT Frequency  3x / week    PT Duration  8 weeks    PT Treatment/Interventions  Cryotherapy;Electrical Stimulation;Moist Heat;Ultrasound;Patient/family education;Neuromuscular re-education;Therapeutic exercise;Therapeutic activities;Taping;Vasopneumatic  Device;Passive range of motion;Scar mobilization;Manual techniques    PT Next Visit Plan  strengthening shoulder and RTC progressing slowly, no horizontal abduction per MD precautions verbalized by patient. To MD next Wednesday.    Consulted and Agree with Plan of Care  Patient       Patient will benefit from skilled therapeutic intervention in order to improve the following deficits and impairments:  Decreased activity tolerance, Decreased mobility, Decreased strength, Increased edema, Decreased scar mobility, Impaired flexibility, Decreased endurance, Decreased coordination, Decreased range of motion, Increased fascial restricitons, Pain  Visit Diagnosis: Acute pain of right shoulder  Stiffness of right shoulder, not elsewhere classified  Muscle weakness (generalized)     Problem List Patient Active Problem List   Diagnosis Date Noted  . Morbid obesity with body mass index (BMI) of 50.0 to 59.9 in adult [facility]) 04/14/2016  . Type 2 diabetes mellitus without complications (HCC) 10/05/2015  . Hypersomnia with sleep apnea 06/20/2015  . Right lumbar radiculopathy 03/13/2014  . Occipital headache 01/03/2014  . 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 Benign paroxysmal positional vertigo 02/03/2013  . Ear pain 03/12/2012  . General medical examination 08/06/2011  . SINUS TACHYCARDIA 08/22/2008  . GERD 08/22/2008  . HYPERLIPIDEMIA, MIXED 05/25/2007  . OBESITY, MORBID 05/25/2007  . Bipolar disorder (HCC) 05/25/2007  . MIGRAINE, CHRONIC 05/25/2007  . Essential hypertension 05/25/2007  . Allergic rhinitis 05/25/2007  . Obstructive sleep apnea 05/17/2007    [name], [name] 04/27/2017, 10:05 AM  [facility] [address], [address] [place], [place], [address] Phone: [phone]   Fax:  [phone]  Name: Asusena Sigley MRN: [id number] Date of Birth: Dec 15, 1970

## 2017-04-28 ENCOUNTER — Ambulatory Visit: Payer: Self-pay | Admitting: Registered"

## 2017-04-29 ENCOUNTER — Encounter: Payer: Self-pay | Admitting: Physical Therapy

## 2017-04-29 ENCOUNTER — Ambulatory Visit: Payer: 59 | Admitting: Physical Therapy

## 2017-04-29 DIAGNOSIS — R600 Localized edema: Secondary | ICD-10-CM | POA: Diagnosis not present

## 2017-04-29 DIAGNOSIS — M6281 Muscle weakness (generalized): Secondary | ICD-10-CM | POA: Diagnosis not present

## 2017-04-29 DIAGNOSIS — M25611 Stiffness of right shoulder, not elsewhere classified: Secondary | ICD-10-CM | POA: Diagnosis not present

## 2017-04-29 DIAGNOSIS — M25511 Pain in right shoulder: Secondary | ICD-10-CM

## 2017-04-29 DIAGNOSIS — Z4789 Encounter for other orthopedic aftercare: Secondary | ICD-10-CM | POA: Diagnosis not present

## 2017-04-29 MED FILL — metFORMIN HCL 500 MG TABS: 500 | 90 days supply | Qty: 180 | Fill #2

## 2017-04-29 NOTE — Therapy (Signed)
[facility] [address], [address] [place], [place], [address] Phone: [phone]   Fax:  [phone]  Physical Therapy Treatment  Patient Details  Name: [name] MRN: [id number] Date of Birth: 1970/06/11 Referring Provider: Dr. Malon KindleSteven Norris   Encounter Date: 04/29/2017  PT End of Session - 04/29/17 0923    Visit Number  45    Date for PT Re-Evaluation  05/25/17    Authorization Type  UMR    PT Start Time  0923    PT Stop Time  1013    PT Time Calculation (min)  50 min    Activity Tolerance  Patient tolerated treatment well    Behavior During Therapy  [facility] for tasks assessed/performed       Past Medical History:  Diagnosis Date  . Allergy   . Asthma    pt stated treated as ashtma but not really asthma  . Bipolar disorder (HCC)   . Chicken pox   . Complication of anesthesia    [facility]  . Depression   . Diabetes mellitus   . Dyspnea   . GERD (gastroesophageal reflux disease)   . Hyperlipidemia   . Hypertension    readings  . Hypothyroidism   . Irregular heartbeat    saw dr Myrtis SerKatz sees cardiology as needed  . Migraine   . Sleep apnea     Past Surgical History:  Procedure Laterality Date  . ANKLE SURGERY  1989   left  . BACK SURGERY  1999  . CESAREAN SECTION     1995  . LAPAROSCOPIC ASSISTED VAGINAL HYSTERECTOMY  2009     BSO fibroids, DUB, pelvic pain  . 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 SHOULDER ARTHROSCOPY WITH OPEN ROTATOR CUFF REPAIR Right 01/02/2017   Procedure: Right shoulder arthroscopy, A-subcromial decompression, mini open rotator cuff repair, open distal clavicle resection, biceps tenodesis;  Surgeon: Beverely LowNorris, Steve, MD;  [facility];   Service: Orthopedics;  Laterality: Right;  . THYROIDECTOMY, PARTIAL  2001   removed left  . UMBILICAL HERNIA REPAIR  2003  . WISDOM TOOTH EXTRACTION      There were no vitals filed for this visit.  Subjective Assessment - 04/29/17 0925    Subjective  No new complaints, feeling stronger every day.    Currently in Pain?  No/denies    Multiple Pain Sites  No                      [facility] Adult PT Treatment/Exercise - 04/29/17 0001      Shoulder Exercises: Supine   Other Supine Exercises  Supine circles at 90 degrees 20x2  each way Attempted yellow but this was too heavy      Shoulder Exercises: Sidelying   External Rotation  Strengthening;Right;20 reps;Weights    External Rotation Weight (lbs)  1    ABduction  Strengthening;Left 3x10    ABduction Weight (lbs)  2      Shoulder Exercises: Standing   Flexion  AROM;Strengthening;Right;20 reps;Weights 2 sets    Theraband Level (Shoulder Flexion)  -- worked on upper end range of flexion last 2 sets: 3x10    Shoulder Flexion Weight (lbs)  2    ABduction  -- Up wall, hold off wall,  slide down wall 2x10 1 1/2# on wrist    Shoulder Elevation  Strengthening;Right;Standing flexion 2# to second shelf 15x2; scaption 2#20x    Other Standing Exercises  UE Ranger on #35 20x2, then scaption 10x  2# stil but worked 1/2 way to overhead only    Other Standing Exercises  [place] green 15x2 each TC to squeeze elbow to trunk more      Shoulder Exercises: Pulleys   Flexion  3 minutes Assit pt with end range stretch    ABduction  3 minutes    Other Pulley Exercises  behind the back 3x 10      Shoulder Exercises: ROM/Strengthening   UBE (Upper Arm Bike)  L1 5x5       Moist Heat Therapy   Number Minutes Moist Heat  10 Minutes    Moist Heat Location  Shoulder               PT Short Term Goals - 02/02/17 1457      PT SHORT TERM GOAL #1   Title  independent with initial HEP with her husband helping    Status  Achieved       PT SHORT TERM GOAL #2   Title  understand how to ice the shoulder    Status  Achieved      PT SHORT TERM GOAL #3   Title  pain in right shoulder decreased >/= 25%    Status  Achieved        PT Long Term Goals - 04/22/17 1000      PT LONG TERM GOAL #1   Title  Independent with HEP and how to progress herself    Time  8    Period  Weeks    Status  On-going      PT LONG TERM GOAL #2   Title  right shoulder AROM flexion and abduction > or = to 150 deg for ability to reach overhead in cabinets    Time  8    Period  Weeks    Status  On-going      PT LONG TERM GOAL #3   Title  right shoulder strength is >/= 4/5 so she is able to perform her work tasks without increase in pain    Time  8    Period  Weeks    Status  On-going      PT LONG TERM GOAL #4   Title  wean from sling and report 50% use of Rt UE as allowed by MD    Time  8    Period  Weeks    Status  Achieved      PT LONG TERM GOAL #5   Title  able to lift light object like her purse due to right shoulder strength >/= 4/5 and no restrictions from the MD    Time  8    Period  Weeks    Status  On-going      PT LONG TERM GOAL #6   Title  FOTO score </= 54% limitation    Time  8    Period  Weeks    Status  On-going            Plan - 04/29/17 [id number]    Clinical Impression Statement  Overall doing well with her functional strength, deficits in strength are around end range in al planes.     Rehab Potential  Excellent    Clinical Impairments Affecting Rehab Potential  Abduction  ok, ok to strenghen and increase function, no horizontal abduction, add lat pull down next session. Consider revising LTG of 150 degrees flexion, pt more on track fr 130 degrees by DC date.    PT Frequency  3x / week    PT Duration  8 weeks    PT Treatment/Interventions  Cryotherapy;Electrical Stimulation;Moist Heat;Ultrasound;Patient/family education;Neuromuscular re-education;Therapeutic exercise;Therapeutic activities;Taping;Vasopneumatic  Device;Passive range of motion;Scar mobilization;Manual techniques    PT Next Visit Plan  Reass next visit, prepare for DC, to MD next Wednesday route note with measurements. FOTO    Consulted and Agree with Plan of Care  --       Patient will benefit from skilled therapeutic intervention in order to improve the following deficits and impairments:  Decreased activity tolerance, Decreased mobility, Decreased strength, Increased edema, Decreased scar mobility, Impaired flexibility, Decreased endurance, Decreased coordination, Decreased range of motion, Increased fascial restricitons, Pain  Visit Diagnosis: Acute pain of right shoulder  Stiffness of right shoulder, not elsewhere classified  Muscle weakness (generalized)     Problem List Patient Active Problem List   Diagnosis Date Noted  . Morbid obesity with body mass index (BMI) of 50.0 to 59.9 in adult [facility]) 04/14/2016  . Type 2 diabetes mellitus without complications (HCC) 10/05/2015  . Hypersomnia with sleep apnea 06/20/2015  . Right lumbar radiculopathy 03/13/2014  . Occipital headache 01/03/2014  . 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 Benign paroxysmal positional vertigo 02/03/2013  . Ear pain 03/12/2012  . General medical examination 08/06/2011  . SINUS TACHYCARDIA 08/22/2008  . GERD 08/22/2008  . HYPERLIPIDEMIA, MIXED 05/25/2007  . OBESITY, MORBID 05/25/2007  . Bipolar disorder (HCC) 05/25/2007  . MIGRAINE, CHRONIC 05/25/2007  . Essential hypertension 05/25/2007  . Allergic rhinitis 05/25/2007  . Obstructive sleep apnea 05/17/2007    Naveena Eyman, PTA 04/29/2017, 10:02 AM  [facility] [address], [address] [place], [place], [address] Phone: [phone]   Fax:  [phone]  Name: [name] MRN: [id number] Date of Birth: 11/03/1970

## 2017-05-01 ENCOUNTER — Ambulatory Visit: Payer: 59 | Admitting: Physical Therapy

## 2017-05-01 DIAGNOSIS — M25511 Pain in right shoulder: Secondary | ICD-10-CM | POA: Diagnosis not present

## 2017-05-01 DIAGNOSIS — R600 Localized edema: Secondary | ICD-10-CM | POA: Diagnosis not present

## 2017-05-01 DIAGNOSIS — M25611 Stiffness of right shoulder, not elsewhere classified: Secondary | ICD-10-CM

## 2017-05-01 DIAGNOSIS — Z4789 Encounter for other orthopedic aftercare: Secondary | ICD-10-CM | POA: Diagnosis not present

## 2017-05-01 DIAGNOSIS — M6281 Muscle weakness (generalized): Secondary | ICD-10-CM | POA: Diagnosis not present

## 2017-05-01 NOTE — Therapy (Signed)
[facility] [address], [place], [place], [address] Phone: [phone]   Fax:  [phone]  Physical Therapy Treatment  Patient Details  Name: [name] MRN: [id number] Date of Birth: 11/23/1970 Referring Provider: Dr. Esmond Plants   Encounter Date: 05/01/2017  PT End of Session - 05/01/17 0938    Visit Number  76    Date for PT Re-Evaluation  05/25/17    Authorization Type  UMR    PT Start Time  0923    PT Stop Time  1012    PT Time Calculation (min)  49 min    Activity Tolerance  Patient tolerated treatment well    Behavior During Therapy  [facility] for tasks assessed/performed       Past Medical History:  Diagnosis Date  . Allergy   . Asthma    pt stated treated as ashtma but not really asthma  . Bipolar disorder ([place])   . Chicken pox   . Complication of anesthesia    oxygen saturation dropped after hysterectomy 2009 at [facility]  . Depression   . Diabetes mellitus   . Dyspnea   . GERD (gastroesophageal reflux disease)   . Hyperlipidemia   . Hypertension    readings  . Hypothyroidism   . Irregular heartbeat    saw dr Ron [place] sees cardiology as needed  . Migraine   . Sleep apnea     Past Surgical History:  Procedure Laterality Date  . [place]   left  . BACK SURGERY  1999  . CESAREAN SECTION     1995  . LAPAROSCOPIC ASSISTED VAGINAL HYSTERECTOMY  2009     BSO fibroids, DUB, pelvic pain  . 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 REPAIR N/A 04/14/2016   Procedure: LAPAROSCOPIC ROUX-EN-Y GASTRIC BYPASS  WITH UPPER ENDOSCOPY;  Surgeon: Excell Seltzer, MD;  Location: [facility];  Service: General;  Laterality: N/A;  . SHOULDER ARTHROSCOPY WITH OPEN ROTATOR CUFF REPAIR Right 01/02/2017   Procedure: Right shoulder arthroscopy, A-subcromial decompression, mini open rotator cuff repair, open distal clavicle resection, biceps tenodesis;  Surgeon: Netta Cedars, MD;  Location: [place];   Service: Orthopedics;  Laterality: Right;  . THYROIDECTOMY, PARTIAL  2001   removed left  . UMBILICAL HERNIA REPAIR  2003  . WISDOM TOOTH EXTRACTION      There were no vitals filed for this visit.  Subjective Assessment - 05/01/17 0938    Subjective  No new complaints, feeling stronger every day.  Pt feels    Patient Stated Goals  reduce pain and improve mobility and strength    Currently in Pain?  No/denies         [facility] PT Assessment - 05/01/17 0001      AROM   Right Shoulder Flexion  140 Degrees      Strength   Right Shoulder Flexion  4/5    Right Shoulder ABduction  4/5    Right Shoulder Internal Rotation  5/5    Right Shoulder External Rotation  4+/5                  [facility] Adult PT Treatment/Exercise - 05/01/17 0001      Shoulder Exercises: Sidelying   External Rotation  Strengthening;Right;20 reps;Weights    External Rotation Weight (lbs)  1    ABduction  Strengthening;Left 3x10    ABduction Weight (lbs)  2      Shoulder Exercises: Standing   Flexion  AROM;Strengthening;Right;20 reps;Weights  2 sets    Shoulder Flexion Weight (lbs)  2    ABduction  -- Up wall, hold off wall, slide down wall 2x10 1 1/2# on wrist    Shoulder Elevation  Strengthening;Right;Standing flexion 2# to second shelf 15x2; scaption 2#20x    Other Standing Exercises  UE Ranger on #35 20x2, then scaption 10x  2# stil but worked 1/2 way to overhead only    Other Standing Exercises  [place] green 15x2 each TC to squeeze elbow to trunk more      Shoulder Exercises: Pulleys   Flexion  3 minutes Assit pt with end range stretch    ABduction  3 minutes    Other Pulley Exercises  behind the back 3x 10      Shoulder Exercises: ROM/Strengthening   UBE (Upper Arm Bike)  L1 5x5       Moist Heat Therapy   Number Minutes Moist Heat  10 Minutes               PT Short Term Goals - 02/02/17 1457      PT SHORT TERM GOAL #1   Title  independent with initial HEP with her husband  helping    Status  Achieved      PT SHORT TERM GOAL #2   Title  understand how to ice the shoulder    Status  Achieved      PT SHORT TERM GOAL #3   Title  pain in right shoulder decreased >/= 25%    Status  Achieved        PT Long Term Goals - 05/01/17 0935      PT LONG TERM GOAL #1   Title  Independent with HEP and how to progress herself    Time  8    Period  Weeks    Status  Achieved      PT LONG TERM GOAL #2   Title  right shoulder AROM flexion and abduction > or = to 150 deg for ability to reach overhead in cabinets    Baseline  140 which is significant improvement and is able to continue working on ROM at home    Time  8    Period  Weeks    Status  Not Met      PT LONG TERM GOAL #3   Title  right shoulder strength is >/= 4/5 so she is able to perform her work tasks without increase in pain    Baseline  at least 4/5    Time  8    Period  Weeks    Status  Achieved      PT LONG TERM GOAL #4   Title  wean from sling and report 50% use of Rt UE as allowed by MD    Status  Achieved      PT LONG TERM GOAL #5   Title  able to lift light object like her purse due to right shoulder strength >/= 4/5 and no restrictions from the MD    Time  8    Period  Weeks    Status  Achieved      PT LONG TERM GOAL #6   Title  FOTO score </= 54% limitation    Baseline  46% limited 05/01/17    Time  8    Period  Weeks    Status  Achieved            Plan - 05/01/17 4270  Clinical Impression Statement  Patient has met all of her long term goals except flexion AROM, but she is at 140.  She is independent with HEP and knowledgable on how to progress exercises on her own with HEP    PT Treatment/Interventions  Cryotherapy;Electrical Stimulation;Moist Heat;Ultrasound;Patient/family education;Neuromuscular re-education;Therapeutic exercise;Therapeutic activities;Taping;Vasopneumatic Device;Passive range of motion;Scar mobilization;Manual techniques    PT Next Visit Plan  discharge  today    PT Home Exercise Plan  progress as needed    Consulted and Agree with Plan of Care  Patient       Patient will benefit from skilled therapeutic intervention in order to improve the following deficits and impairments:  Decreased activity tolerance, Decreased mobility, Decreased strength, Increased edema, Decreased scar mobility, Impaired flexibility, Decreased endurance, Decreased coordination, Decreased range of motion, Increased fascial restricitons, Pain  Visit Diagnosis: Acute pain of right shoulder  Stiffness of right shoulder, not elsewhere classified  Muscle weakness (generalized)  Localized edema     Problem List Patient Active Problem List   Diagnosis Date Noted  . Morbid obesity with body mass index (BMI) of 50.0 to 59.9 in adult [facility]) 04/14/2016  . Type 2 diabetes mellitus without complications ([place]) 51/76/1607  . Hypersomnia with sleep apnea 06/20/2015  . Right lumbar radiculopathy 03/13/2014  . Occipital headache 01/03/2014  . 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 Benign paroxysmal positional vertigo 02/03/2013  . Ear pain 03/12/2012  . General medical examination 08/06/2011  . SINUS TACHYCARDIA 08/22/2008  . GERD 08/22/2008  . HYPERLIPIDEMIA, MIXED 05/25/2007  . OBESITY, MORBID 05/25/2007  . Bipolar disorder ([place]) 05/25/2007  . MIGRAINE, CHRONIC 05/25/2007  . Essential hypertension 05/25/2007  . Allergic rhinitis 05/25/2007  . Obstructive sleep apnea 05/17/2007    Zannie Cove, PT 05/01/2017, 10:04 AM  [facility] [address], [place], [place], [address] Phone: [phone]   Fax:  [phone]  Name: [name] MRN: [id number] Date of Birth: 11-09-1970  PHYSICAL THERAPY DISCHARGE SUMMARY  Visits from Start of Care: 46  Current functional level related to goals / functional outcomes: See above goals   Remaining deficits: See above   Education / Equipment: HEP   Plan: Patient agrees to discharge.  Patient goals were partially met. Patient is being discharged due to meeting the stated rehab goals.  ?????

## 2017-05-06 DIAGNOSIS — Z4789 Encounter for other orthopedic aftercare: Secondary | ICD-10-CM | POA: Diagnosis not present

## 2017-05-06 DIAGNOSIS — M75111 Incomplete rotator cuff tear or rupture of right shoulder, not specified as traumatic: Secondary | ICD-10-CM | POA: Diagnosis not present

## 2017-05-13 DIAGNOSIS — F3162 Bipolar disorder, current episode mixed, moderate: Secondary | ICD-10-CM | POA: Diagnosis not present

## 2017-05-13 MED FILL — traZODone HCL 50 MG TABS: 50 | 90 days supply | Qty: 180 | Fill #0

## 2017-06-03 MED FILL — lamoTRIgine 200 MG TABS: 200 | 90 days supply | Qty: 180 | Fill #0

## 2017-06-18 MED FILL — LISINOPRIL 20 MG TABLET: 20 | 90 days supply | Qty: 90 | Fill #1

## 2017-06-23 ENCOUNTER — Ambulatory Visit: Payer: Self-pay | Admitting: Internal Medicine

## 2017-06-25 DIAGNOSIS — M25512 Pain in left shoulder: Secondary | ICD-10-CM | POA: Diagnosis not present

## 2017-06-29 MED FILL — tiaGABine HCL 4 MG TABS: 4 | 90 days supply | Qty: 360 | Fill #1

## 2017-07-01 IMAGING — US US EXTREM LOW VENOUS*L*
1 series · 13 of 24 positions shown · non-contrast
Comparison: None.

CLINICAL DATA: Chronic lump below the left knee, with pain and
swelling, after falling on left side. Initial encounter.



[Series 1: us extrem low venous*left* · 0.09mm/px · 13 of 44 slices shown]
[im 1/44]
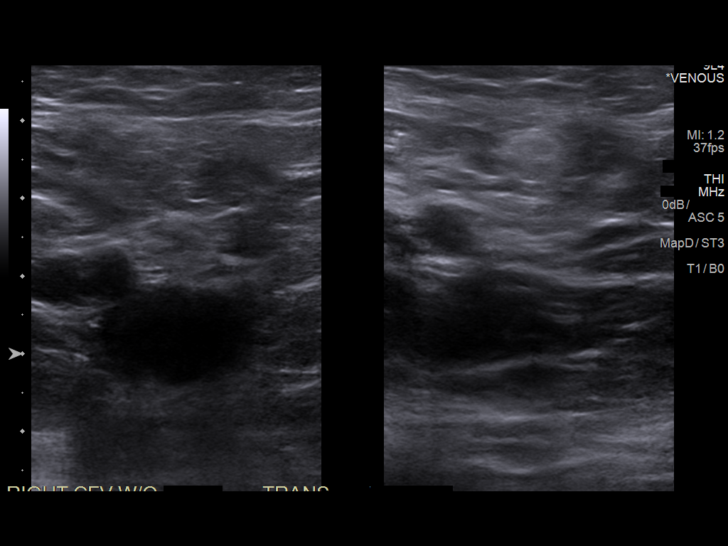
[im 4/44]
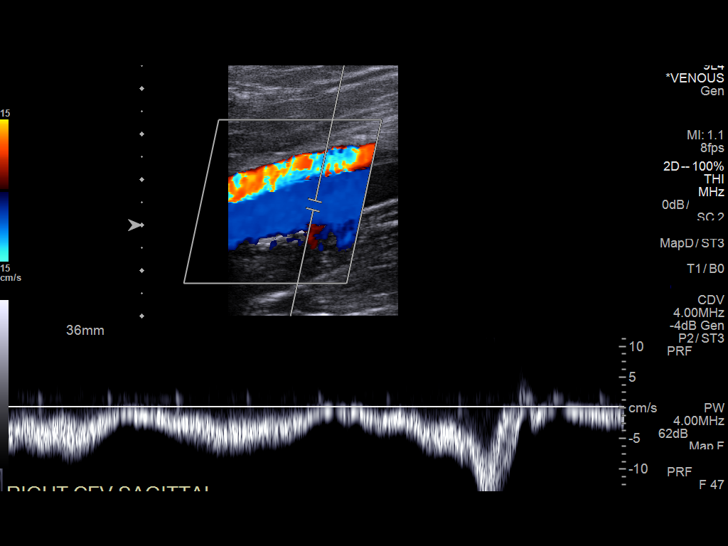
[im 8/44]
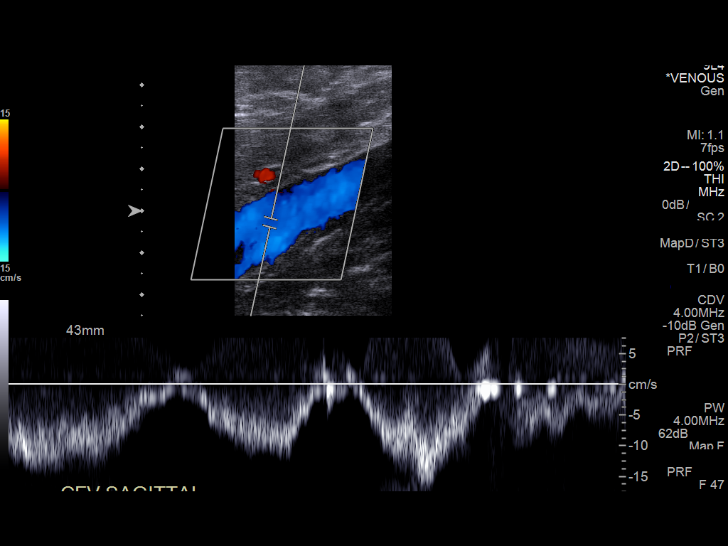
[im 12/44]
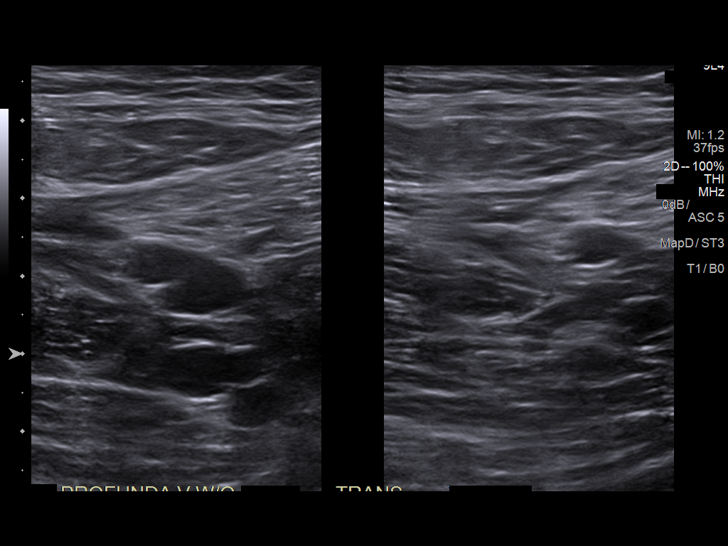
[im 15/44]
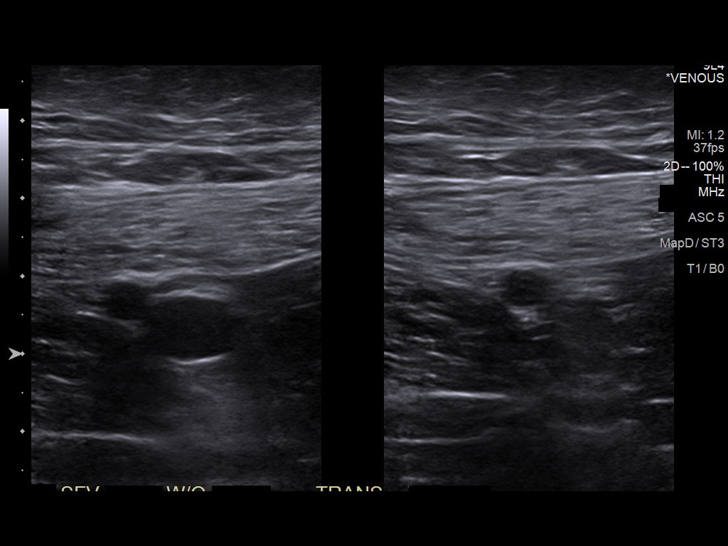
[im 19/44]
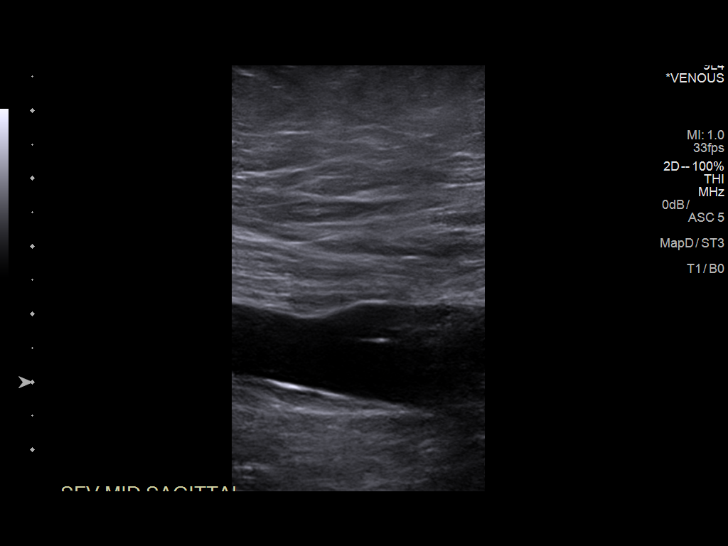
[im 23/44]
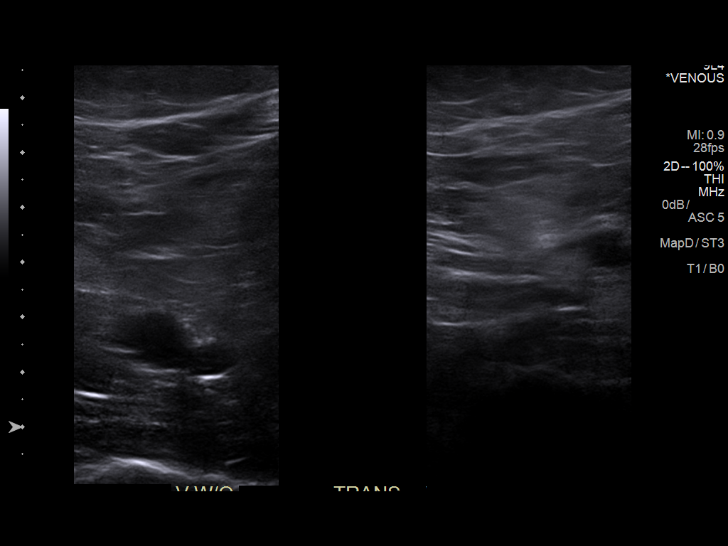
[im 25/44]
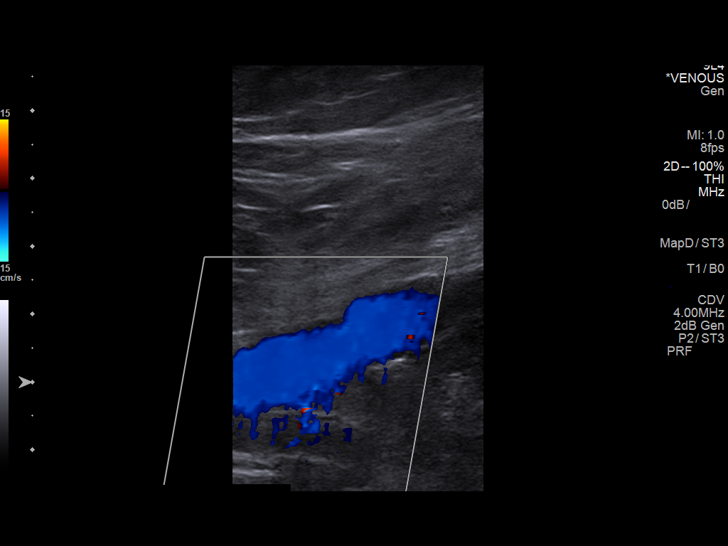
[im 29/44]
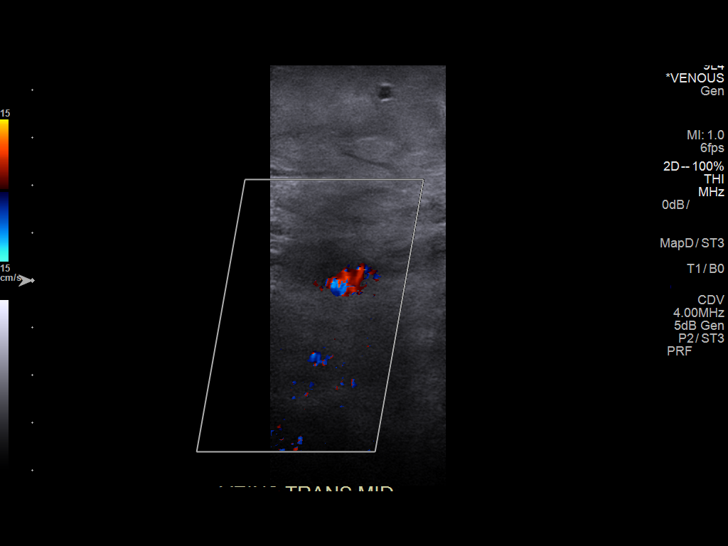
[im 32/44]
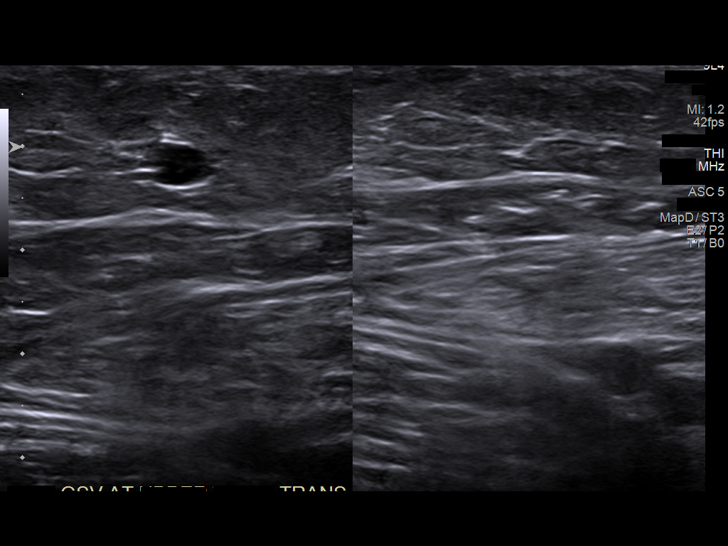
[im 36/44]
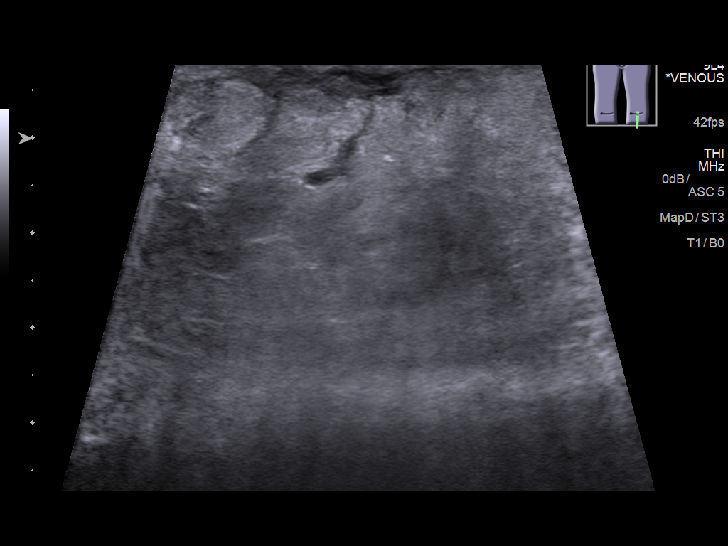
[im 40/44]
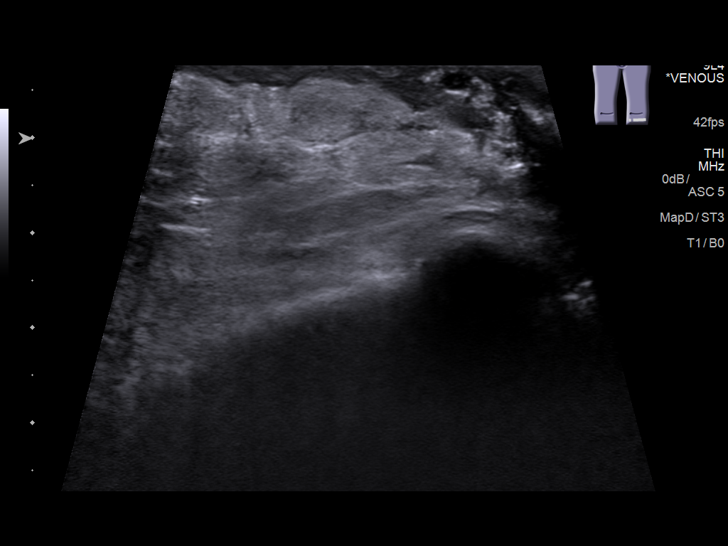
[im 44/44]
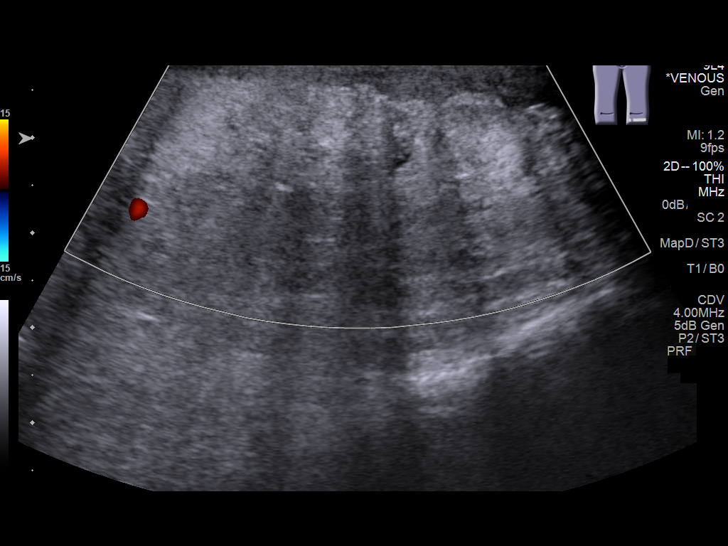

[13 of 24 positions shown; findings below may reference images not displayed]

FINDINGS: Contralateral Common Femoral Vein: Respiratory phasicity is normal
and symmetric with the symptomatic side. No evidence of thrombus.
Normal compressibility.

Common Femoral Vein: No evidence of thrombus. Normal
compressibility, respiratory phasicity and response to augmentation.

Saphenofemoral Junction: No evidence of thrombus. Normal
compressibility and flow on color Doppler imaging.

Profunda Femoral Vein: No evidence of thrombus. Normal
compressibility and flow on color Doppler imaging.

Femoral Vein: No evidence of thrombus. Normal compressibility,
respiratory phasicity and response to augmentation.

Popliteal Vein: No evidence of thrombus. Normal compressibility,
respiratory phasicity and response to augmentation.

Calf Veins: No evidence of thrombus. Normal compressibility and flow
on color Doppler imaging. The peroneal vein is not visualized.

Superficial Great Saphenous Vein: No evidence of thrombus. Normal
compressibility and flow on color Doppler imaging.

Venous Reflux:  None.

Other Findings: Mild focal soft tissue edema is noted at the site of
clinical concern, at the palpable lump inferior to the left patella.
IMPRESSION: 1. No evidence of deep venous thrombosis.
2. Focal soft tissue edema noted at the palpable lump inferior to
the left patella.

## 2017-07-01 MED FILL — ARIPiprazole 10 MG TABS: 10 | 90 days supply | Qty: 90 | Fill #0

## 2017-07-01 MED FILL — TEGRETOL XR 200 MG TABLET: 200 | 90 days supply | Qty: 270 | Fill #0

## 2017-07-12 DIAGNOSIS — J028 Acute pharyngitis due to other specified organisms: Secondary | ICD-10-CM | POA: Diagnosis not present

## 2017-07-12 DIAGNOSIS — J069 Acute upper respiratory infection, unspecified: Secondary | ICD-10-CM | POA: Diagnosis not present

## 2017-07-31 ENCOUNTER — Other Ambulatory Visit: Payer: Self-pay | Admitting: Family Medicine

## 2017-07-31 MED FILL — metFORMIN HCL 500 MG TABS: 500 | 90 days supply | Qty: 180 | Fill #3

## 2017-07-31 MED FILL — ATORVASTATIN 40 MG TABLET: 40 | 90 days supply | Qty: 90 | Fill #0

## 2017-08-03 ENCOUNTER — Ambulatory Visit: Payer: 59 | Admitting: Internal Medicine

## 2017-08-03 ENCOUNTER — Encounter: Payer: Self-pay | Admitting: Internal Medicine

## 2017-08-03 VITALS — BP 144/85 | HR 59 | Ht 69.0 in | Wt 265.4 lb

## 2017-08-03 DIAGNOSIS — E119 Type 2 diabetes mellitus without complications: Secondary | ICD-10-CM | POA: Diagnosis not present

## 2017-08-03 LAB — POCT GLYCOSYLATED HEMOGLOBIN (HGB A1C): HEMOGLOBIN A1C: 7.6

## 2017-08-03 NOTE — Patient Instructions (Addendum)
Please continue Metformin 500 mg 2x a day.  Please return in 3-4 months with your sugar log.   

## 2017-08-03 NOTE — Progress Notes (Signed)
Patient ID: [name], female   DOB: 1971/01/23, 47 y.o.   MRN: [id number]  HPI: [name] is a 47 y.o.-year-old female, returning for f/u for DM2, dx in 2001, insulin-dependent 2007, uncontrolled, without long term complications. Last visit 7 mo ago.  She had a Roux-en-Y surgery in 03/2016 >> lost >100 lbs;  however, she gained back  20 pounds since last visit.  She had right shoulder Sx in 12/2016 >> only got back to work in 02/2017. Her eating habits worsened during this period. Since them, she started to improve her diet >> back on track now. She also started to go to the gym 3-4 days a week.  Sugars have improved already.  Last hemoglobin A1c was: Lab Results  Component Value Date   HGBA1C 7.6 08/03/2017   HGBA1C 6.6 (H) 12/30/2016   HGBA1C 6.1 (H) 10/09/2016   HGBA1C 9.6 02/21/2016   HGBA1C 9.2 10/05/2015   HGBA1C 10.4 05/31/2015   HGBA1C 8.8 01/09/2015   HGBA1C 11.0 (H) 06/21/2014   HGBA1C 8.8 (H) 01/03/2014   HGBA1C 9.6 (H) 04/12/2013   HGBA1C 11.0 (H) 11/18/2012   HGBA1C 9.0 (H) 11/12/2011   HGBA1C 9.6 (H) 06/23/2011   Pt was on a regimen of: - Metformin 2000 mg at dinnertime - Lantus 63 units at bedtime - Victoza 1.8 mg daily - Amaryl 4 mg 2x a day She had frequent yeast inf when sugars were higher before.   Pt is now on a regimen of: - Metformin 1000 >> 500 mg 2x a day  Pt checks sugars 1x a day: - am: 147 >> 119-160 >> 80-90s >> 87, 110-120, 150s if snacks before bedtime; 170 - 2h after b'fast: 145-176 >> n/c - before lunch: 82-167 >> 120s >> n/c - 2h after lunch:  106-166 >> n/c - before dinner: n/c >> 86-110 >> n/c - 2h after dinner: 150-190 >> 83-101 >> 100-120 - bedtime: 250-260 >> 83-101 - nighttime: n/c Lowest sugar was 34 (after Sx - while still on insulin) >> 87; she has hypoglycemia awareness at 90s..  Highest sugar was 128 (forgot Metformin) >> 130.  Glucometer: One Touch Ultra 2    - no CKD, last BUN/creatinine:  Lab Results  Component  Value Date   BUN 11 12/30/2016   CREATININE 0.51 12/30/2016  On lisinopril. -+ HL; last set of lipids: Lab Results  Component Value Date   CHOL 167 10/09/2016   HDL 60 10/09/2016   LDLCALC 88 10/09/2016   LDLDIRECT 112.0 06/21/2014   TRIG 97 10/09/2016   CHOLHDL 2.8 10/09/2016  On Atorvastatin. - last eye exam was 11/2016: No DR. Dr Emily Filbert.  - no numbness and tingling in her feet.  On ASA 81.   ROS: Constitutional: + weight gain/no weight loss, no fatigue, no subjective hyperthermia, no subjective hypothermia Eyes: no blurry vision, no xerophthalmia ENT: no sore throat, no nodules palpated in throat, no dysphagia, no odynophagia, no hoarseness Cardiovascular: no CP/no SOB/no palpitations/no leg swelling Respiratory: no cough/no SOB/no wheezing Gastrointestinal: no N/no V/no D/no C/no acid reflux Musculoskeletal: no muscle aches/no joint aches Skin: no rashes, no hair loss Neurological: no tremors/no numbness/no tingling/no dizziness  I reviewed pt's medications, allergies, PMH, social hx, family hx, and changes were documented in the history of present illness. Otherwise, unchanged from my initial visit note.  Past Medical History:  Diagnosis Date  . Allergy   . Asthma    pt stated treated as ashtma but not really asthma  . Bipolar disorder (  HCC)   . Chicken pox   . Complication of anesthesia    oxygen saturation dropped after hysterectomy 2009 at [facility]  . Depression   . Diabetes mellitus   . Dyspnea   . GERD (gastroesophageal reflux disease)   . Hyperlipidemia   . Hypertension    readings  . Hypothyroidism   . Irregular heartbeat    saw dr Myrtis Ser sees cardiology as needed  . Migraine   . Sleep apnea    Past Surgical History:  Procedure Laterality Date  . ANKLE SURGERY  1989   left  . BACK SURGERY  1999  . CESAREAN SECTION     1995  . LAPAROSCOPIC ASSISTED VAGINAL HYSTERECTOMY  2009     BSO fibroids, DUB, pelvic pain  . LAPAROSCOPIC ROUX-EN-Y GASTRIC BYPASS  WITH HIATAL HERNIA REPAIR N/A 04/14/2016   Procedure: LAPAROSCOPIC ROUX-EN-Y GASTRIC BYPASS  WITH UPPER ENDOSCOPY;  Surgeon: Glenna Fellows, MD;  Location: [facility];  Service: General;  Laterality: N/A;  . SHOULDER ARTHROSCOPY WITH OPEN ROTATOR CUFF REPAIR Right 01/02/2017   Procedure: Right shoulder arthroscopy, A-subcromial decompression, mini open rotator cuff repair, open distal clavicle resection, biceps tenodesis;  Surgeon: Beverely Low, MD;  Location: [facility];  Service: Orthopedics;  Laterality: Right;  . THYROIDECTOMY, PARTIAL  2001   removed left  . UMBILICAL HERNIA REPAIR  2003  . WISDOM TOOTH EXTRACTION     Social History   Social History  . Marital Status: Married    Spouse Name: N/A  . Number of Children: 1   Occupational History  . Registrar, Administrator   Social History Main Topics  . Smoking status: Never Smoker   . Smokeless tobacco: Never Used  . Alcohol Use: No  . Drug Use: No   Current Outpatient Medications on File Prior to Visit  Medication Sig Dispense Refill  . ARIPiprazole (ABILIFY) 10 MG tablet Take 10 mg by mouth daily.   1  . atorvastatin (LIPITOR) 40 MG tablet TAKE 1 TABLET BY MOUTH DAILY 90 tablet 1  . B-D UF III MINI PEN NEEDLES 31G X 5 MM MISC USE TO INJECT INSULIN 2 TIMES DAILY 180 each 3  . Biotin 16109 MCG TABS Take 1 tablet by mouth daily.    . Blood Glucose Monitoring Suppl (FREESTYLE LITE) DEVI Use to check sugar 2 times daily 1 each 0  . Calcium Carbonate-Vit D-Min (CALTRATE 600+D PLUS MINIS PO) Take 1 tablet by mouth 3 (three) times daily.    . carbamazepine (TEGRETOL XR) 200 MG 12 hr tablet 1 tablet in the morning, 2 at night    . clobetasol ointment (TEMOVATE) 0.05 % Apply 1 application topically 2 (two) times daily. Do not use for more than 7 days (Patient taking differently: Apply 1 application topically 2 (two) times daily as needed. Do not use for more than 7 days) 60 g 1  . fluticasone (FLONASE) 50 MCG/ACT nasal spray Place 2  sprays into both nostrils daily. (Patient taking differently: Place 2 sprays into both nostrils daily as needed for allergies or rhinitis. ) 16 g 2  . gabapentin (NEURONTIN) 600 MG tablet Take 600 mg by mouth 3 (three) times daily.   1  . glucose blood (FREESTYLE LITE) test strip Use as instructed to check sugar 2 times daily 200 each 5  . lamoTRIgine (LAMICTAL) 200 MG tablet Take 200 mg by mouth 2 (two) times daily.    . Lancets (FREESTYLE) lancets Use as instructed to check sugar 2 times daily  200 each 5  . lisinopril (PRINIVIL,ZESTRIL) 20 MG tablet Take 1 tablet (20 mg total) by mouth daily. 90 tablet 1  . metFORMIN (GLUCOPHAGE) 500 MG tablet Take 1 tablet (500 mg total) by mouth 2 (two) times daily with a meal. 180 tablet 3  . Multiple Vitamins-Minerals (BARIATRIC FUSION PO) Take 1 tablet by mouth 2 (two) times daily.    . tiaGABine (GABITRIL) 4 MG tablet Take 8 mg by mouth 2 (two) times daily.     No current facility-administered medications on file prior to visit.    Allergies  Allergen Reactions  . Zofran [Ondansetron Hcl] Other (See Comments)    Severe headache   Family History  Problem Relation Age of Onset  . Hyperlipidemia Mother   . Diabetes Mother   . Anxiety disorder Mother   . Heart disease Mother   . Hypertension Father   . Lupus Father   . Heart disease Father   . Stroke Maternal Aunt   . Stroke Paternal Grandfather   . Mental illness Paternal Grandfather   . Breast cancer Neg Hx    PE: BP (!) 144/85   Pulse (!) 59   Ht 5\' 9"  (1.753 m)   Wt 265 lb 6.4 oz (120.4 kg)   LMP 01/25/2008   BMI 39.19 kg/m  Wt Readings from Last 3 Encounters:  08/03/17 265 lb 6.4 oz (120.4 kg)  01/22/17 242 lb 12.8 oz (110.1 kg)  01/02/17 253 lb (114.8 kg)   Constitutional: overweight, in NAD Eyes: PERRLA, EOMI, no exophthalmos ENT: moist mucous membranes, no thyromegaly, no cervical lymphadenopathy Cardiovascular: RRR, No MRG Respiratory: CTA B Gastrointestinal: abdomen  soft, NT, ND, BS+ Musculoskeletal: no deformities, strength intact in all 4 Skin: moist, warm, no rashes Neurological: no tremor with outstretched hands, DTR normal in all 4  ASSESSMENT: 1. DM2, insulin-dependent, uncontrolled, without long-term complications  2. Obesity  PLAN:  1. Patient with long-standing, previously uncontrolled diabetes, on a complex antidiabetic regimen, now status post Roux-en-Y gastric bypass >a year ago and status post weight loss of more than 100 pounds after which she could come off all of her medicines except metformin.  However, since last visit, she gained 23 pounds and her sugars are higher.  She started to improve her diet in the last few weeks and cleaned her cabinets of noncompliant foods.  She also started to go to the gym and she already saw her sugars improving. - Therefore, I will not change her regimen at this point, but advised her to let me know if the sugars do not improve in the next few weeks, in that case we may need to increase metformin. - I advised her to:  Patient Instructions  Please continue metformin 500 mg twice a day.  Please come back for a follow-up appointment in 4 months.  - today, HbA1c is 7.6% (higher) - continue checking sugars at different times of the day - check 1x a day, rotating checks - advised for yearly eye exams >> she is UTD - Return to clinic in 4 mo with sugar log   2. Obesity class II - She fell off the wagon with her diet after her shoulder surgery.  However, in the last few weeks, she started to make positive changes in her diet and also started to go to the gym.  Her bariatric doctor recommended to start exercising 5 times a week.  She is planning to continue to go to the gym 3-4 times a week and the  rest of the time to work out from home, using work out videos  - she gained 20 pounds since last visit, but she is still net approximately 80 pounds under her presurgical weight  Carlus Pavlovristina Xylia Scherger, MD PhD [facility]

## 2017-08-07 MED FILL — CLOBETASOL PROPIONATE 0.05: 0.05 | 7 days supply | Qty: 60 | Fill #1

## 2017-08-12 DIAGNOSIS — F3132 Bipolar disorder, current episode depressed, moderate: Secondary | ICD-10-CM | POA: Diagnosis not present

## 2017-09-07 MED FILL — lamoTRIgine 200 MG TABS: 200 | 90 days supply | Qty: 180 | Fill #1

## 2017-09-08 ENCOUNTER — Other Ambulatory Visit: Payer: Self-pay | Admitting: Internal Medicine

## 2017-09-15 MED FILL — GABAPENTIN 600 MG TAB: 600 | 90 days supply | Qty: 270 | Fill #0

## 2017-10-06 MED FILL — tiaGABine HCL 4 MG TABS: 4 | 90 days supply | Qty: 360 | Fill #0

## 2017-10-07 ENCOUNTER — Other Ambulatory Visit: Payer: Self-pay | Admitting: Family Medicine

## 2017-10-07 MED FILL — LISINOPRIL 20 MG TABLET: 20 | 90 days supply | Qty: 90 | Fill #0

## 2017-10-07 NOTE — Telephone Encounter (Signed)
Please advise last OV 10/2016, pt has a CPE with Dr. Mardelle Matte on 10/13/17.

## 2017-10-13 ENCOUNTER — Other Ambulatory Visit: Payer: Self-pay

## 2017-10-13 ENCOUNTER — Ambulatory Visit (INDEPENDENT_AMBULATORY_CARE_PROVIDER_SITE_OTHER): Payer: 59 | Admitting: Family Medicine

## 2017-10-13 ENCOUNTER — Encounter: Payer: Self-pay | Admitting: Family Medicine

## 2017-10-13 VITALS — BP 142/80 | HR 68 | Temp 98.6°F | Ht 69.0 in | Wt 273.8 lb

## 2017-10-13 DIAGNOSIS — I10 Essential (primary) hypertension: Secondary | ICD-10-CM

## 2017-10-13 DIAGNOSIS — Z0000 Encounter for general adult medical examination without abnormal findings: Secondary | ICD-10-CM

## 2017-10-13 LAB — COMPREHENSIVE METABOLIC PANEL
ALK PHOS: 111 U/L (ref 39–117)
ALT: 20 U/L (ref 0–35)
AST: 16 U/L (ref 0–37)
Albumin: 3.8 g/dL (ref 3.5–5.2)
BUN: 12 mg/dL (ref 6–23)
CHLORIDE: 100 meq/L (ref 96–112)
CO2: 29 mEq/L (ref 19–32)
Calcium: 9.1 mg/dL (ref 8.4–10.5)
Creatinine, Ser: 0.46 mg/dL (ref 0.40–1.20)
GFR: 155.5 mL/min (ref >60.00)
GLUCOSE: 224 mg/dL — AB (ref 70–99)
POTASSIUM: 4.5 meq/L (ref 3.5–5.1)
Sodium: 137 mEq/L (ref 135–145)
TOTAL PROTEIN: 6.4 g/dL (ref 6.0–8.3)
Total Bilirubin: 0.3 mg/dL (ref 0.2–1.2)

## 2017-10-13 LAB — CBC WITH DIFFERENTIAL/PLATELET
BASOS PCT: 0.7 % (ref 0.0–3.0)
Basophils Absolute: 0 10*3/uL (ref 0.0–0.1)
EOS PCT: 5 % (ref 0.0–5.0)
Eosinophils Absolute: 0.3 10*3/uL (ref 0.0–0.7)
HCT: 40.1 % (ref 36.0–46.0)
Hemoglobin: 13.5 g/dL (ref 12.0–15.0)
LYMPHS ABS: 2 10*3/uL (ref 0.7–4.0)
Lymphocytes Relative: 30.5 % (ref 12.0–46.0)
MCHC: 33.6 g/dL (ref 30.0–36.0)
MCV: 87.8 fl (ref 78.0–100.0)
MONO ABS: 0.4 10*3/uL (ref 0.1–1.0)
Monocytes Relative: 6.6 % (ref 3.0–12.0)
NEUTROS ABS: 3.7 10*3/uL (ref 1.4–7.7)
NEUTROS PCT: 57.2 % (ref 43.0–77.0)
PLATELETS: 245 10*3/uL (ref 150.0–400.0)
RBC: 4.57 Mil/uL (ref 3.87–5.11)
RDW: 12.5 % (ref 11.5–15.5)
WBC: 6.4 10*3/uL (ref 4.0–10.5)

## 2017-10-13 LAB — LIPID PANEL
Cholesterol: 165 mg/dL (ref 0–200)
HDL: 50.2 mg/dL (ref >39.00)
LDL Cholesterol: 83 mg/dL (ref 0–99)
NonHDL: 114.38
TRIGLYCERIDES: 157 mg/dL — AB (ref 0.0–149.0)
Total CHOL/HDL Ratio: 3
VLDL: 31.4 mg/dL (ref 0.0–40.0)

## 2017-10-13 MED ORDER — HYDROCHLOROTHIAZIDE 12.5 MG PO CAPS: 12.5000 mg | ORAL_CAPSULE | Freq: Every day | ORAL | 0 refills | Status: DC

## 2017-10-13 MED FILL — HYDROCHLOROTHIAZIDE 12.5 MG: 12.5 | 90 days supply | Qty: 90 | Fill #0

## 2017-10-13 NOTE — Patient Instructions (Addendum)
Please schedule a follow up appointment in 6 months with Dr. Beverely Low for Hypertension  Please go to the Lab for blood work.    If you have MyChart, your results will be available to view, please respond through MyChart with questions.  We will schedule follow-up according to results.  Please do these things to maintain good health!   Exercise at least 30-45 minutes a day,  4-5 days a week.   Eat a low-fat diet with lots of fruits and vegetables, up to 7-9 servings per day.  Drink plenty of water daily. Try to drink 8 8oz glasses per day.  Seatbelts can save your life. Always wear your seatbelt.  Place Smoke Detectors on every level of your home and check batteries every year.  Schedule an appointment with an eye doctor for an eye exam every 1-2 years  Safe sex - use condoms to protect yourself from STDs if you could be exposed to these types of infections. Use birth control if you do not want to become pregnant and are sexually active.  Avoid heavy alcohol use. If you drink, keep it to less than 2 drinks/day and not every day.  Health Care Power of Attorney.  Choose someone you trust that could speak for you if you became unable to speak for yourself.  Depression is common in our stressful world.If you're feeling down or losing interest in things you normally enjoy, please come in for a visit.  If anyone is threatening or hurting you, please get help. Physical or Emotional Violence is never OK.

## 2017-10-13 NOTE — Progress Notes (Signed)
Subjective  Chief Complaint  Patient presents with  . Annual Exam    doing well, no complaints    HPI: [name] is a 47 y.o. female who presents to [facility] today for a Female Wellness Visit.   Wellness Visit: annual visit with health maintenance review and exam without Pap; has GYN, breast exam and mammo up to date   Happy and doing well. Sees endocrine and bariatric surgery: both DM and weight loss are stable. Reports no vitamin deficiencies   HTN: running just above normal or high normal on lisinopril daily. No cp or sob or le edema. Had been on hctz in past.   Lifestyle: Body mass index is 40.43 kg/m. Wt Readings from Last 3 Encounters:  10/13/17 273 lb 12.8 oz (124.2 kg)  08/03/17 265 lb 6.4 oz (120.4 kg)  01/22/17 242 lb 12.8 oz (110.1 kg)   Diet: low fat Exercise: intermittently,   Patient Active Problem List   Diagnosis Date Noted  . Type 2 diabetes mellitus without complications (HCC) 10/05/2015  . Hypersomnia with sleep apnea 06/20/2015  . Right lumbar radiculopathy 03/13/2014  . Occipital headache 01/03/2014  . 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 Benign paroxysmal positional vertigo 02/03/2013  . Ear pain 03/12/2012  . General medical examination 08/06/2011  . SINUS TACHYCARDIA 08/22/2008    Qualifier: Diagnosis of  By: Myrtis Ser, MD, Ruthann Cancer Lemmie Evens    . GERD 08/22/2008    Qualifier: Diagnosis of  By: Myrtis Ser, MD, Ruthann Cancer Lemmie Evens    . HYPERLIPIDEMIA, MIXED 05/25/2007    Qualifier: Diagnosis of  By: Dance CMA (AAMA), Kim     . Class 2 obesity with body mass index (BMI) of 39.0 to 39.9 in adult 05/25/2007    Qualifier: Diagnosis of  By: Dance CMA (AAMA), Kim     . Bipolar disorder (HCC) 05/25/2007    Qualifier: Diagnosis of  By: Dance CMA (AAMA), Kim     . MIGRAINE, CHRONIC 05/25/2007    Qualifier: Diagnosis of  By: Dance CMA (AAMA), Kim     . Essential hypertension 05/25/2007    Qualifier:  Diagnosis of  By: Dance CMA (AAMA), Kim     . Allergic rhinitis 05/25/2007    Qualifier: Diagnosis of  By: Dance CMA (AAMA), Kim     . Obstructive sleep apnea 05/17/2007    PSG 2008- AHI 24 corrected by C Pap 11 cm     Health Maintenance  Topic Date Due  . HIV Screening  11/15/1986  . FOOT EXAM  10/09/2017  . PNEUMOCOCCAL POLYSACCHARIDE VACCINE (2) 10/14/2018 (Originally 07/26/2006)  . MAMMOGRAM  11/07/2017  . OPHTHALMOLOGY EXAM  11/19/2017  . INFLUENZA VACCINE  12/24/2017  . HEMOGLOBIN A1C  02/03/2018  . TETANUS/TDAP  02/12/2021   Immunization History  Administered Date(s) Administered  . Influenza Split 03/13/2015  . Influenza,inj,Quad PF,6+ Mos 04/05/2013, 02/22/2014, 02/08/2016  . Influenza-Unspecified 02/19/2017  . Pneumococcal Polysaccharide-23 07/25/2001  . Tdap 10/29/2007   We updated and reviewed the patient's past history in detail and it is documented below. Allergies: Patient is allergic to zofran [ondansetron hcl]. Past Medical History Patient  has a past medical history of Allergy, Asthma, Bipolar disorder (HCC), Chicken pox, Complication of anesthesia, Depression, Diabetes mellitus, Dyspnea, GERD (gastroesophageal reflux disease), Hyperlipidemia, Hypertension, Hypothyroidism, Irregular heartbeat, Migraine, and Sleep apnea. Past Surgical History Patient  has a past surgical history that includes Cesarean section; Thyroidectomy, partial (2001); Laparoscopic assisted vaginal hysterectomy (2009); Back surgery (1999); Ankle surgery (  1989); Umbilical hernia repair (2003); Laparoscopic roux-en-y gastric bypass with hiatal hernia repair (N/A, 04/14/2016); Wisdom tooth extraction; and Shoulder arthroscopy with open rotator cuff repair (Right, 01/02/2017). Family History: Patient family history includes Anxiety disorder in her mother; Diabetes in her mother; Heart disease in her father and mother; Hyperlipidemia in her mother; Hypertension in her father; Lupus in her  father; Mental illness in her paternal grandfather; Stroke in her maternal aunt and paternal grandfather. Social History:  Patient  reports that she has never smoked. She has never used smokeless tobacco. She reports that she does not drink alcohol or use drugs.  Review of Systems: Constitutional: negative for fever or malaise Ophthalmic: negative for photophobia, double vision or loss of vision Cardiovascular: negative for chest pain, dyspnea on exertion, or new LE swelling Respiratory: negative for SOB or persistent cough Gastrointestinal: negative for abdominal pain, change in bowel habits or melena Genitourinary: negative for dysuria or gross hematuria, no abnormal uterine bleeding or disharge Musculoskeletal: negative for new gait disturbance or muscular weakness Integumentary: negative for new or persistent rashes, no breast lumps Neurological: negative for TIA or stroke symptoms Psychiatric: negative for SI or delusions Allergic/Immunologic: negative for hives Patient Care Team    Relationship Specialty Notifications Start End  Sheliah Hatch, MD PCP - General Family Medicine  06/24/11   Jerene Bears, MD Consulting Physician Gynecology  07/26/15   Carlus Pavlov, MD Consulting Physician Internal Medicine  07/26/15   Andee Poles, MD Consulting Physician Psychiatry  07/27/15     Objective  Vitals: BP (!) 142/80   Pulse 68   Temp 98.6 F (37 C)   Ht  (1.753 m)   Wt 273 lb 12.8 oz (124.2 kg)   LMP 01/25/2008   BMI 40.43 kg/m  General:  Well developed, well nourished, no acute distress  Psych:  Alert and orientedx3,normal mood and affect HEENT:  Normocephalic, atraumatic, non-icteric sclera, PERRL, oropharynx is clear without mass or exudate, supple neck without adenopathy, mass or thyromegaly Cardiovascular:  Normal S1, S2, RRR without gallop, rub or murmur, nondisplaced PMI Respiratory:  Good breath sounds bilaterally, CTAB with normal respiratory  effort Gastrointestinal: normal bowel sounds, soft, non-tender, no noted masses. No HSM MSK: no deformities, contusions. Joints are without erythema or swelling. Spine and CVA region are nontender Skin:  Warm, no rashes or suspicious lesions noted Neurologic:    Mental status is normal. CN 2-11 are normal. Gross motor and sensory exams are normal. Normal gait. No tremor Diabetic Foot Exam: Appearance - no lesions, ulcers or calluses Skin - no sigificant pallor or erythema Monofilament testing - sensitive bilaterally in following locations:  Right - Great toe, medial, central, lateral ball and posterior foot intact  Left - Great toe, medial, central, lateral ball and posterior foot intact Pulses - +2 distally bilaterally  Assessment  1. Annual physical exam   2. Essential hypertension      Plan  Female Wellness Visit:  Age appropriate Health Maintenance and Prevention measures were discussed with patient. Included topics are cancer screening recommendations, ways to keep healthy (see AVS) including dietary and exercise recommendations, regular eye and dental care, use of seat belts, and avoidance of moderate alcohol use and tobacco use. Screens are up to date.  BMI: discussed patient's BMI and encouraged positive lifestyle modifications to help get to or maintain a target BMI.  HM needs and immunizations were addressed and ordered. See below for orders. See HM and immunization section for updates.  Routine  labs and screening tests ordered including cmp, cbc and lipids where appropriate.  Discussed recommendations regarding Vit D and calcium supplementation (see AVS)  HTN: uncontrolled: add hctz 12.5; pt to check home readings. If stable, can change to combination pill.   Follow up: Return in about 6 months (around 04/15/2018) for follow up Hypertension with Dr. Beverely Low.    Commons side effects, risks, benefits, and alternatives for medications and treatment plan prescribed today  were discussed, and the patient expressed understanding of the given instructions. Patient is instructed to call or message via MyChart if he/she has any questions or concerns regarding our treatment plan. No barriers to understanding were identified. We discussed Red Flag symptoms and signs in detail. Patient expressed understanding regarding what to do in case of urgent or emergency type symptoms.   Medication list was reconciled, printed and provided to the patient in AVS. Patient instructions and summary information was reviewed with the patient as documented in the AVS. This note was prepared with assistance of Dragon voice recognition software. Occasional wrong-word or sound-a-like substitutions may have occurred due to the inherent limitations of voice recognition software  Orders Placed This Encounter  Procedures  . CBC with Differential/Platelet  . Comprehensive metabolic panel  . Lipid panel  . HIV antibody   Meds ordered this encounter  Medications  . hydrochlorothiazide (MICROZIDE) 12.5 MG capsule    Sig: Take 1 capsule (12.5 mg total) by mouth daily.    Dispense:  90 capsule    Refill:  0

## 2017-10-14 LAB — HIV ANTIBODY (ROUTINE TESTING W REFLEX): HIV 1&2 Ab, 4th Generation: NONREACTIVE

## 2017-10-14 MED FILL — TEGRETOL XR 200 MG TABLET: 200 | 90 days supply | Qty: 270 | Fill #1

## 2017-10-14 MED FILL — ARIPiprazole 10 MG TABS: 10 | 90 days supply | Qty: 90 | Fill #1

## 2017-10-16 DIAGNOSIS — G4733 Obstructive sleep apnea (adult) (pediatric): Secondary | ICD-10-CM | POA: Diagnosis not present

## 2017-10-28 DIAGNOSIS — F3132 Bipolar disorder, current episode depressed, moderate: Secondary | ICD-10-CM | POA: Diagnosis not present

## 2017-11-02 MED FILL — ATORVASTATIN 40 MG TABLET: 40 | 90 days supply | Qty: 90 | Fill #1

## 2017-11-02 MED FILL — metFORMIN HCL 500 MG TABS: 500 | 90 days supply | Qty: 180 | Fill #0

## 2017-12-03 ENCOUNTER — Ambulatory Visit: Payer: Self-pay | Admitting: Internal Medicine

## 2017-12-17 MED FILL — GABAPENTIN 600 MG TABS: 600 | 90 days supply | Qty: 270 | Fill #0

## 2017-12-17 MED FILL — lamoTRIgine 200 MG TABS: 200 | 90 days supply | Qty: 180 | Fill #0

## 2018-01-05 MED FILL — tiaGABine HCL 4 MG TABS: 4 | 90 days supply | Qty: 360 | Fill #1

## 2018-01-05 MED FILL — LISINOPRIL 20 MG TABLET: 20 | 90 days supply | Qty: 90 | Fill #1

## 2018-01-11 ENCOUNTER — Other Ambulatory Visit: Payer: Self-pay | Admitting: Family Medicine

## 2018-01-11 MED FILL — HYDROCHLOROTHIAZIDE 12.5 MG: 12.5 | 90 days supply | Qty: 90 | Fill #0

## 2018-01-18 MED FILL — TEGRETOL XR 200 MG TABLET: 200 | 90 days supply | Qty: 270 | Fill #0

## 2018-01-18 MED FILL — ARIPiprazole 10 MG TABS: 10 | 90 days supply | Qty: 90 | Fill #0

## 2018-01-27 DIAGNOSIS — F3132 Bipolar disorder, current episode depressed, moderate: Secondary | ICD-10-CM | POA: Diagnosis not present

## 2018-02-05 MED FILL — metFORMIN HCL 500 MG TABS: 500 | 90 days supply | Qty: 180 | Fill #1

## 2018-02-19 ENCOUNTER — Other Ambulatory Visit: Payer: Self-pay

## 2018-02-19 ENCOUNTER — Ambulatory Visit (INDEPENDENT_AMBULATORY_CARE_PROVIDER_SITE_OTHER): Payer: 59 | Admitting: Obstetrics & Gynecology

## 2018-02-19 ENCOUNTER — Encounter: Payer: Self-pay | Admitting: Obstetrics & Gynecology

## 2018-02-19 VITALS — BP 124/86 | HR 72 | Resp 18 | Ht 68.0 in | Wt 275.6 lb

## 2018-02-19 DIAGNOSIS — Z01419 Encounter for gynecological examination (general) (routine) without abnormal findings: Secondary | ICD-10-CM | POA: Diagnosis not present

## 2018-02-19 DIAGNOSIS — Z1211 Encounter for screening for malignant neoplasm of colon: Secondary | ICD-10-CM

## 2018-02-19 DIAGNOSIS — Z23 Encounter for immunization: Secondary | ICD-10-CM | POA: Diagnosis not present

## 2018-02-19 DIAGNOSIS — Z0000 Encounter for general adult medical examination without abnormal findings: Secondary | ICD-10-CM | POA: Diagnosis not present

## 2018-02-19 LAB — POCT URINALYSIS DIPSTICK
Bilirubin, UA: NEGATIVE
Blood, UA: NEGATIVE
Glucose, UA: POSITIVE — AB
KETONES UA: NEGATIVE
NITRITE UA: NEGATIVE
PH UA: 6 (ref 5.0–8.0)
PROTEIN UA: NEGATIVE
UROBILINOGEN UA: 0.2 U/dL

## 2018-02-19 NOTE — Progress Notes (Signed)
47 y.o. G61P1001 Married White or Caucasian female here for annual exam.  Had right shoulder surgery in August with Dr. Ranell Patrick.  Did physical therapy for four months.  ROM is fairly good.  Denies vaginal bleeding.      Patient's last menstrual period was 01/25/2008.          Sexually active: Yes.    The current method of family planning is status post hysterectomy.    Exercising: No.  walking at the gym Smoker:  no  Health Maintenance: Pap:  2009 Normal  History of abnormal Pap:  no MMG:  11/07/16 BIRADS1:Neg  Colonoscopy:  Never BMD:   Never TDaP:  2009 Pneumonia vaccine(s):  2003 Shingrix:   n/a Hep C testing: n/a Screening Labs: PCP  UA: glucose=1000+, wbc=trace   reports that she has never smoked. She has never used smokeless tobacco. She reports that she does not drink alcohol or use drugs.  Past Medical History:  Diagnosis Date  . Allergy   . Asthma    pt stated treated as ashtma but not really asthma  . Bipolar disorder (HCC)   . Chicken pox   . Complication of anesthesia    oxygen saturation dropped after hysterectomy 2009 at [facility]  . Depression   . Diabetes mellitus   . Dyspnea   . GERD (gastroesophageal reflux disease)   . Hyperlipidemia   . Hypertension    readings  . Hypothyroidism   . Irregular heartbeat    saw dr Myrtis Ser sees cardiology as needed  . Migraine   . Sleep apnea     Past Surgical History:  Procedure Laterality Date  . ANKLE SURGERY  1989   left  . BACK SURGERY  1999  . CESAREAN SECTION     1995  . LAPAROSCOPIC ASSISTED VAGINAL HYSTERECTOMY  2009     BSO fibroids, DUB, pelvic pain  . 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 SHOULDER ARTHROSCOPY WITH OPEN ROTATOR CUFF REPAIR Right 01/02/2017   Procedure: Right shoulder arthroscopy,  A-subcromial decompression, mini open rotator cuff repair, open distal clavicle resection, biceps tenodesis;  Surgeon: Beverely Low, MD;  Location: [facility] OR;  Service: Orthopedics;  Laterality: Right;  . THYROIDECTOMY, PARTIAL  2001   removed left  . UMBILICAL HERNIA REPAIR  2003  . WISDOM TOOTH EXTRACTION      Current Outpatient Medications  Medication Sig Dispense Refill  . ARIPiprazole (ABILIFY) 10 MG tablet Take 10 mg by mouth daily.   1  . atorvastatin (LIPITOR) 40 MG tablet TAKE 1 TABLET BY MOUTH DAILY 90 tablet 1  . Biotin 16109 MCG TABS Take 1 tablet by mouth daily.    . Blood Glucose Monitoring Suppl (FREESTYLE LITE) DEVI Use to check sugar 2 times daily 1 each 0  . Calcium Carbonate-Vit D-Min (CALTRATE 600+D PLUS MINIS PO) Take 1 tablet by mouth 3 (three) times daily.    . carbamazepine (TEGRETOL XR) 200 MG 12 hr tablet 1 tablet in the morning, 2 at night    . clobetasol ointment (TEMOVATE) 0.05 % Apply 1 application topically 2 (two) times daily. Do not use for more than 7 days (Patient taking differently: Apply 1 application topically 2 (two) times daily as needed. Do not use for more than 7 days) 60 g 1  . fluticasone (FLONASE) 50 MCG/ACT  nasal spray Place 2 sprays into both nostrils daily. (Patient taking differently: Place 2 sprays into both nostrils daily as needed for allergies or rhinitis. ) 16 g 2  . gabapentin (NEURONTIN) 600 MG tablet Take 600 mg by mouth 3 (three) times daily.   1  . glucose blood (FREESTYLE LITE) test strip Use as instructed to check sugar 2 times daily 200 each 5  . hydrochlorothiazide (MICROZIDE) 12.5 MG capsule TAKE 1 CAPSULE BY MOUTH DAILY. 90 capsule 0  . lamoTRIgine (LAMICTAL) 200 MG tablet Take 200 mg by mouth 2 (two) times daily.    . Lancets (FREESTYLE) lancets Use as instructed to check sugar 2 times daily 200 each 5  . lisinopril (PRINIVIL,ZESTRIL) 20 MG tablet TAKE 1 TABLET (20 MG TOTAL) BY MOUTH DAILY. 90 tablet 1  . metFORMIN (GLUCOPHAGE) 500  MG tablet TAKE 1 TABLET (500 MG TOTAL) BY MOUTH 2 TIMES DAILY WITH A MEAL. 180 tablet 3  . Multiple Vitamin (MULTIVITAMIN) tablet Take 1 tablet by mouth daily.    . tiaGABine (GABITRIL) 4 MG tablet Take 8 mg by mouth 2 (two) times daily.    . traZODone (DESYREL) 50 MG tablet Take 1 tablet by mouth at bedtime as needed.     No current facility-administered medications for this visit.     Family History  Problem Relation Age of Onset  . Hyperlipidemia Mother   . Diabetes Mother   . Anxiety disorder Mother   . Heart disease Mother   . Hypertension Father   . Lupus Father   . Heart disease Father   . Stroke Maternal Aunt   . Stroke Paternal Grandfather   . Mental illness Paternal Grandfather   . Breast cancer Neg Hx     Review of Systems  Cardiovascular: Positive for palpitations.  All other systems reviewed and are negative.   Exam:   BP 124/86 (BP Location: Right Arm, Patient Position: Sitting, Cuff Size: Large)   Pulse 72   Resp 18   Ht 5\' 8"  (1.727 m)   Wt 275 lb 9.6 oz (125 kg)   LMP 01/25/2008   BMI 41.90 kg/m    Height: 5\' 8"  (172.7 cm)  Ht Readings from Last 3 Encounters:  02/19/18 5\' 8"  (1.727 m)  10/13/17 5\' 9"  (1.753 m)  08/03/17 5\' 9"  (1.753 m)    General appearance: alert, cooperative and appears stated age Head: Normocephalic, without obvious abnormality, atraumatic Neck: no adenopathy, supple, symmetrical, trachea midline and thyroid normal to inspection and palpation Lungs: clear to auscultation bilaterally Breasts: normal appearance, no masses or tenderness Heart: regular rate and rhythm Abdomen: soft, non-tender; bowel sounds normal; no masses,  no organomegaly Extremities: extremities normal, atraumatic, no cyanosis or edema Skin: Skin color, texture, turgor normal. No rashes or lesions Lymph nodes: Cervical, supraclavicular, and axillary nodes normal. No abnormal inguinal nodes palpated Neurologic: Grossly normal   Pelvic: External genitalia:   no lesions              Urethra:  normal appearing urethra with no masses, tenderness or lesions              Bartholins and Skenes: normal                 Vagina: normal appearing vagina with normal color and discharge, no lesions              Cervix: absent              Pap taken:  No. Bimanual Exam:  Uterus:  uterus absent              Adnexa: no mass, fullness, tenderness               Rectovaginal: Confirms               Anus:  normal sphincter tone, no lesions  Chaperone was present for exam.  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  Pt aware.  She will schedule. pap smear not indicated Tdap updated today. Other vaccines are UTD. IFOB given.  New ACS screening guidelines reviewed. Lab work UTD with Dr. Beverely Low return annually or prn

## 2018-02-22 ENCOUNTER — Other Ambulatory Visit: Payer: Self-pay | Admitting: Family Medicine

## 2018-02-22 MED FILL — ATORVASTATIN 40 MG TABLET: 40 | 90 days supply | Qty: 90 | Fill #0

## 2018-02-25 DIAGNOSIS — Z1211 Encounter for screening for malignant neoplasm of colon: Secondary | ICD-10-CM | POA: Diagnosis not present

## 2018-03-07 LAB — FECAL OCCULT BLOOD, IMMUNOCHEMICAL: FECAL OCCULT BLD: NEGATIVE

## 2018-04-01 MED FILL — GABAPENTIN 600 MG TABS: 600 | 90 days supply | Qty: 270 | Fill #1

## 2018-04-01 MED FILL — lamoTRIgine 200 MG TABS: 200 | 90 days supply | Qty: 180 | Fill #1

## 2018-04-09 ENCOUNTER — Other Ambulatory Visit: Payer: Self-pay | Admitting: Family Medicine

## 2018-04-09 MED FILL — LISINOPRIL 20 MG TABLET: 20 | 90 days supply | Qty: 90 | Fill #0

## 2018-04-09 MED FILL — HYDROCHLOROTHIAZIDE 12.5 MG: 12.5 | 90 days supply | Qty: 90 | Fill #0

## 2018-04-09 MED FILL — tiaGABine HCL 4 MG TABS: 4 | 90 days supply | Qty: 360 | Fill #0

## 2018-04-15 ENCOUNTER — Encounter: Payer: Self-pay | Admitting: Family Medicine

## 2018-04-15 ENCOUNTER — Other Ambulatory Visit: Payer: Self-pay

## 2018-04-15 ENCOUNTER — Ambulatory Visit: Payer: 59 | Admitting: Family Medicine

## 2018-04-15 VITALS — BP 121/81 | HR 78 | Temp 99.1°F | Resp 16 | Ht 68.0 in | Wt 275.5 lb

## 2018-04-15 DIAGNOSIS — E782 Mixed hyperlipidemia: Secondary | ICD-10-CM | POA: Diagnosis not present

## 2018-04-15 DIAGNOSIS — I10 Essential (primary) hypertension: Secondary | ICD-10-CM

## 2018-04-15 DIAGNOSIS — E119 Type 2 diabetes mellitus without complications: Secondary | ICD-10-CM

## 2018-04-15 DIAGNOSIS — Z6839 Body mass index (BMI) 39.0-39.9, adult: Secondary | ICD-10-CM | POA: Diagnosis not present

## 2018-04-15 DIAGNOSIS — F3178 Bipolar disorder, in full remission, most recent episode mixed: Secondary | ICD-10-CM

## 2018-04-15 LAB — HEMOGLOBIN A1C: Hgb A1c MFr Bld: 9.9 % — ABNORMAL HIGH (ref 4.6–6.5)

## 2018-04-15 LAB — BASIC METABOLIC PANEL
BUN: 15 mg/dL (ref 6–23)
CALCIUM: 9.3 mg/dL (ref 8.4–10.5)
CO2: 27 mEq/L (ref 19–32)
Chloride: 99 mEq/L (ref 96–112)
Creatinine, Ser: 0.63 mg/dL (ref 0.40–1.20)
GFR: 107.93 mL/min (ref >60.00)
GLUCOSE: 237 mg/dL — AB (ref 70–99)
POTASSIUM: 4.4 meq/L (ref 3.5–5.1)
SODIUM: 136 meq/L (ref 135–145)

## 2018-04-15 LAB — CBC WITH DIFFERENTIAL/PLATELET
BASOS ABS: 0 10*3/uL (ref 0.0–0.1)
Basophils Relative: 0.6 % (ref 0.0–3.0)
EOS PCT: 4.1 % (ref 0.0–5.0)
Eosinophils Absolute: 0.3 10*3/uL (ref 0.0–0.7)
HEMATOCRIT: 41.1 % (ref 36.0–46.0)
Hemoglobin: 13.8 g/dL (ref 12.0–15.0)
LYMPHS PCT: 30.3 % (ref 12.0–46.0)
Lymphs Abs: 1.9 10*3/uL (ref 0.7–4.0)
MCHC: 33.7 g/dL (ref 30.0–36.0)
MCV: 88.2 fl (ref 78.0–100.0)
MONOS PCT: 8.4 % (ref 3.0–12.0)
Monocytes Absolute: 0.5 10*3/uL (ref 0.1–1.0)
Neutro Abs: 3.6 10*3/uL (ref 1.4–7.7)
Neutrophils Relative %: 56.6 % (ref 43.0–77.0)
Platelets: 258 10*3/uL (ref 150.0–400.0)
RBC: 4.66 Mil/uL (ref 3.87–5.11)
RDW: 12.8 % (ref 11.5–15.5)
WBC: 6.4 10*3/uL (ref 4.0–10.5)

## 2018-04-15 LAB — TSH: TSH: 1.16 u[IU]/mL (ref 0.35–4.50)

## 2018-04-15 LAB — LIPID PANEL
CHOL/HDL RATIO: 4
CHOLESTEROL: 168 mg/dL (ref 0–200)
HDL: 42 mg/dL (ref >39.00)
LDL CALC: 91 mg/dL (ref 0–99)
NONHDL: 125.94
Triglycerides: 173 mg/dL — ABNORMAL HIGH (ref 0.0–149.0)
VLDL: 34.6 mg/dL (ref 0.0–40.0)

## 2018-04-15 LAB — HEPATIC FUNCTION PANEL
ALBUMIN: 4 g/dL (ref 3.5–5.2)
ALK PHOS: 111 U/L (ref 39–117)
ALT: 20 U/L (ref 0–35)
AST: 19 U/L (ref 0–37)
BILIRUBIN DIRECT: 0 mg/dL (ref 0.0–0.3)
TOTAL PROTEIN: 7.1 g/dL (ref 6.0–8.3)
Total Bilirubin: 0.3 mg/dL (ref 0.2–1.2)

## 2018-04-15 NOTE — Patient Instructions (Signed)
Schedule your complete physical in May We'll notify you of your lab results and make any changes if needed Continue to work on healthy diet and regular exercise- you can do it! Call and schedule your eye exam and mammogram! Call with any questions or concerns Happy Holidays!!!

## 2018-04-15 NOTE — Progress Notes (Signed)
   Subjective:    Patient ID: [name], female    DOB: 05-31-1970, 47 y.o.   MRN: [id number]  HPI HTN- chronic problem, on HCTZ 12.5mg , Lisinopril 20mg  w/ good control.  No CP, SOB, HAs, visual changes, edema.  Hyperlipidemia- chronic problem, on Lipitor 40mg  daily.  No abd pain, N/V.  DM- chronic problem, following w/ Dr Elvera LennoxGherghe but has not been seen in 8 months.  UTD on foot exam.  Due for eye exam.  On ACE for renal protection.  Denies symptomatic lows- 'had a high at work and I almost passed out'.  Obesity- pt's BMI is 41.9  'I kinda let my eating slip'.  Recently returned to the gym and has lost 6 lbs.   Review of Systems For ROS see HPI     Objective:   Physical Exam  Constitutional: She is oriented to person, place, and time. She appears well-developed and well-nourished. No distress.  obese  HENT:  Head: Normocephalic and atraumatic.  Eyes: Pupils are equal, round, and reactive to light. Conjunctivae and EOM are normal.  Neck: Normal range of motion. Neck supple. No thyromegaly present.  Cardiovascular: Normal rate, regular rhythm, normal heart sounds and intact distal pulses.  No murmur heard. Pulmonary/Chest: Effort normal and breath sounds normal. No respiratory distress.  Abdominal: Soft. She exhibits no distension. There is no tenderness.  Musculoskeletal: She exhibits no edema.  Lymphadenopathy:    She has no cervical adenopathy.  Neurological: She is alert and oriented to person, place, and time.  Skin: Skin is warm and dry.  Psychiatric: She has a normal mood and affect. Her behavior is normal.  Vitals reviewed.         Assessment & Plan:

## 2018-04-15 NOTE — Assessment & Plan Note (Signed)
Chronic problem.  Following w/ Dr Elvera LennoxGherghe but has not been seen in 8 months.  Due for eye exam- pt to schedule.  UTD on foot exam, on ACE for renal protection.  Check labs and forward to Endo.

## 2018-04-15 NOTE — Assessment & Plan Note (Signed)
Pt is seeing Crystal RobinsonKaren Jones and feels like things are in a good, even place.  No recent adjustments.

## 2018-04-15 NOTE — Assessment & Plan Note (Signed)
Chronic problem.  Well controlled.  Asymptomatic.  Check labs.  No anticipated med changes.  Will follow. 

## 2018-04-15 NOTE — Assessment & Plan Note (Signed)
Deteriorated.  Pt admits that she was no longer following her diet and had not been exercising.  She recently resumed exercise.  Applauded her efforts.  Check labs to risk stratify.  Will follow.

## 2018-04-16 ENCOUNTER — Encounter: Payer: Self-pay | Admitting: Family Medicine

## 2018-04-21 ENCOUNTER — Encounter: Payer: Self-pay | Admitting: Internal Medicine

## 2018-04-21 MED FILL — ARIPiprazole 10 MG TABS: 10 | 90 days supply | Qty: 90 | Fill #1

## 2018-04-21 MED FILL — TEGRETOL XR 200 MG TABLET: 200 | 90 days supply | Qty: 270 | Fill #1

## 2018-04-26 ENCOUNTER — Other Ambulatory Visit: Payer: Self-pay

## 2018-04-26 MED ORDER — METFORMIN HCL 500 MG PO TABS: ORAL_TABLET | ORAL | 2 refills | Status: DC

## 2018-04-26 MED FILL — metFORMIN HCL 500 MG TABS: 500 | 90 days supply | Qty: 360 | Fill #0

## 2018-05-02 ENCOUNTER — Telehealth: Payer: 59 | Admitting: Family

## 2018-05-02 DIAGNOSIS — J028 Acute pharyngitis due to other specified organisms: Secondary | ICD-10-CM | POA: Diagnosis not present

## 2018-05-02 DIAGNOSIS — B9689 Other specified bacterial agents as the cause of diseases classified elsewhere: Secondary | ICD-10-CM

## 2018-05-02 MED ORDER — AZITHROMYCIN 250 MG PO TABS: ORAL_TABLET | ORAL | 0 refills | Status: DC

## 2018-05-02 MED ORDER — PREDNISONE 5 MG PO TABS: 5.0000 mg | ORAL_TABLET | ORAL | 0 refills | Status: DC

## 2018-05-02 NOTE — Progress Notes (Signed)
Thank you for the details you included in the comment boxes. Those details are very helpful in determining the best course of treatment for you and help us to provide the best care.  We are sorry that you are not feeling well.  Here is how we plan to help!  Based on your presentation I believe you most likely have A cough due to bacteria.  When patients have a fever and a productive cough with a change in color or increased sputum production, we are concerned about bacterial bronchitis.  If left untreated it can progress to pneumonia.  If your symptoms do not improve with your treatment plan it is important that you contact your provider.   I have prescribed Azithromyin 250 mg: two tablets now and then one tablet daily for 4 additonal days    In addition you may use A non-prescription cough medication called Mucinex DM: take 2 tablets every 12 hours.  Prednisone 5 mg daily for 6 days (see taper instructions below)  Directions for 6 day taper: Day 1: 2 tablets before breakfast, 1 after both lunch & dinner and 2 at bedtime Day 2: 1 tab before breakfast, 1 after both lunch & dinner and 2 at bedtime Day 3: 1 tab at each meal & 1 at bedtime Day 4: 1 tab at breakfast, 1 at lunch, 1 at bedtime Day 5: 1 tab at breakfast & 1 tab at bedtime Day 6: 1 tab at breakfast   From your responses in the eVisit questionnaire you describe inflammation in the upper respiratory tract which is causing a significant cough.  This is commonly called Bronchitis and has four common causes:    Allergies  Viral Infections  Acid Reflux  Bacterial Infection Allergies, viruses and acid reflux are treated by controlling symptoms or eliminating the cause. An example might be a cough caused by taking certain blood pressure medications. You stop the cough by changing the medication. Another example might be a cough caused by acid reflux. Controlling the reflux helps control the cough.  USE OF BRONCHODILATOR ("RESCUE")  INHALERS: There is a risk from using your bronchodilator too frequently.  The risk is that over-reliance on a medication which only relaxes the muscles surrounding the breathing tubes can reduce the effectiveness of medications prescribed to reduce swelling and congestion of the tubes themselves.  Although you feel brief relief from the bronchodilator inhaler, your asthma may actually be worsening with the tubes becoming more swollen and filled with mucus.  This can delay other crucial treatments, such as oral steroid medications. If you need to use a bronchodilator inhaler daily, several times per day, you should discuss this with your provider.  There are probably better treatments that could be used to keep your asthma under control.     HOME CARE . Only take medications as instructed by your medical team. . Complete the entire course of an antibiotic. . Drink plenty of fluids and get plenty of rest. . Avoid close contacts especially the very young and the elderly . Cover your mouth if you cough or cough into your sleeve. . Always remember to wash your hands . A steam or ultrasonic humidifier can help congestion.   GET HELP RIGHT AWAY IF: . You develop worsening fever. . You become short of breath . You cough up blood. . Your symptoms persist after you have completed your treatment plan MAKE SURE YOU   Understand these instructions.  Will watch your condition.  Will get help right   away if you are not doing well or get worse.  Your e-visit answers were reviewed by a board certified advanced clinical practitioner to complete your personal care plan.  Depending on the condition, your plan could have included both over the counter or prescription medications. If there is a problem please reply  once you have received a response from your provider. Your safety is important to us.  If you have drug allergies check your prescription carefully.    You can use MyChart to ask questions about  today's visit, request a non-urgent call back, or ask for a work or school excuse for 24 hours related to this e-Visit. If it has been greater than 24 hours you will need to follow up with your provider, or enter a new e-Visit to address those concerns. You will get an e-mail in the next two days asking about your experience.  I hope that your e-visit has been valuable and will speed your recovery. Thank you for using e-visits.   

## 2018-05-12 DIAGNOSIS — F411 Generalized anxiety disorder: Secondary | ICD-10-CM | POA: Diagnosis not present

## 2018-05-12 DIAGNOSIS — F3132 Bipolar disorder, current episode depressed, moderate: Secondary | ICD-10-CM | POA: Diagnosis not present

## 2018-05-24 ENCOUNTER — Other Ambulatory Visit: Payer: Self-pay | Admitting: Family Medicine

## 2018-05-24 DIAGNOSIS — Z1231 Encounter for screening mammogram for malignant neoplasm of breast: Secondary | ICD-10-CM

## 2018-06-01 MED FILL — ATORVASTATIN 40 MG TABLET: 40 | 90 days supply | Qty: 90 | Fill #1

## 2018-06-22 ENCOUNTER — Encounter: Payer: Self-pay | Admitting: Family Medicine

## 2018-06-22 ENCOUNTER — Ambulatory Visit
Admission: RE | Admit: 2018-06-22 | Discharge: 2018-06-22 | Disposition: A | Discharge status: Home / Self Care | Payer: 59 | Source: Ambulatory Visit | Attending: Family Medicine | Admitting: Family Medicine

## 2018-06-22 DIAGNOSIS — Z1231 Encounter for screening mammogram for malignant neoplasm of breast: Secondary | ICD-10-CM | POA: Diagnosis not present

## 2018-06-28 ENCOUNTER — Encounter: Payer: Self-pay | Admitting: Family Medicine

## 2018-06-28 DIAGNOSIS — E119 Type 2 diabetes mellitus without complications: Secondary | ICD-10-CM | POA: Diagnosis not present

## 2018-06-28 LAB — HM DIABETES EYE EXAM

## 2018-07-02 MED FILL — GABAPENTIN 600 MG TABS: 600 | 90 days supply | Qty: 270 | Fill #0

## 2018-07-02 MED FILL — lamoTRIgine 200 MG TABS: 200 | 90 days supply | Qty: 180 | Fill #0

## 2018-07-05 MED FILL — LISINOPRIL 20 MG TABLET: 20 | 90 days supply | Qty: 90 | Fill #1

## 2018-07-07 MED FILL — tiaGABine HCL 4 MG TABS: 4 | 90 days supply | Qty: 360 | Fill #0

## 2018-07-22 ENCOUNTER — Other Ambulatory Visit: Payer: Self-pay | Admitting: Family Medicine

## 2018-07-22 MED FILL — HYDROCHLOROTHIAZIDE 12.5 MG: 12.5 | 90 days supply | Qty: 90 | Fill #0

## 2018-07-22 MED FILL — metFORMIN HCL 500 MG TABS: 500 | 90 days supply | Qty: 360 | Fill #1

## 2018-07-22 MED FILL — TEGRETOL XR 200 MG TABLET: 200 | 90 days supply | Qty: 270 | Fill #0

## 2018-07-22 MED FILL — ARIPiprazole 10 MG TABS: 10 | 90 days supply | Qty: 90 | Fill #0

## 2018-07-26 ENCOUNTER — Ambulatory Visit (INDEPENDENT_AMBULATORY_CARE_PROVIDER_SITE_OTHER): Payer: 59 | Admitting: Internal Medicine

## 2018-07-26 ENCOUNTER — Encounter: Payer: Self-pay | Admitting: Internal Medicine

## 2018-07-26 ENCOUNTER — Other Ambulatory Visit: Payer: Self-pay

## 2018-07-26 VITALS — BP 126/70 | HR 78 | Ht 68.0 in | Wt 276.0 lb

## 2018-07-26 DIAGNOSIS — E119 Type 2 diabetes mellitus without complications: Secondary | ICD-10-CM

## 2018-07-26 DIAGNOSIS — E782 Mixed hyperlipidemia: Secondary | ICD-10-CM | POA: Diagnosis not present

## 2018-07-26 LAB — POCT GLYCOSYLATED HEMOGLOBIN (HGB A1C): Hemoglobin A1C: 8.5 % — AB (ref 4.0–5.6)

## 2018-07-26 MED ORDER — DULAGLUTIDE 0.75 MG/0.5ML ~~LOC~~ SOAJ: SUBCUTANEOUS | 5 refills | Status: DC

## 2018-07-26 MED FILL — TRULICITY 0.75 MG/0.5 ML PE: 0.75 | 28 days supply | Qty: 2 | Fill #0

## 2018-07-26 NOTE — Addendum Note (Signed)
Addended by: Darliss Ridgel I on: 07/26/2018 04:03 PM   Modules accepted: Orders

## 2018-07-26 NOTE — Patient Instructions (Signed)
Please continue: - Metformin 1000 mg 2x a day  Please start Trulicity 0.75 mg weekly. Let me know when you are close to running out to call in the higher dose to your pharmacy (1.5 mg).  Please come back for a follow-up appointment in 4 months.

## 2018-07-26 NOTE — Progress Notes (Signed)
Patient ID: [name], female   DOB: Dec 18, 1970, 48 y.o.   MRN: [id number]  HPI: [name] is a 48 y.o.-year-old female, returning for f/u for DM2, dx in 2001, insulin-dependent 2007, uncontrolled, without long term complications. Last visit a year ago.  She had Roux-en-Y surgery in 03/2016.  She lost more than 100 pounds afterwards, however, at last visit, she gained 20 pounds back and since then she gained another 10 pounds.  Sugar started to worsen and latest HbA1c was high.  In last 2 weeks, she started to change her diet >> reduced sweets.  She already lost 5 pounds.  She also plans to restart going to the gym.  Last hemoglobin A1c was: Lab Results  Component Value Date   HGBA1C 9.9 (H) 04/15/2018   HGBA1C 7.6 08/03/2017   HGBA1C 6.6 (H) 12/30/2016   HGBA1C 6.1 (H) 10/09/2016   HGBA1C 9.6 02/21/2016   HGBA1C 9.2 10/05/2015   HGBA1C 10.4 05/31/2015   HGBA1C 8.8 01/09/2015   HGBA1C 11.0 (H) 06/21/2014   HGBA1C 8.8 (H) 01/03/2014   HGBA1C 9.6 (H) 04/12/2013   HGBA1C 11.0 (H) 11/18/2012   HGBA1C 9.0 (H) 11/12/2011   HGBA1C 9.6 (H) 06/23/2011   Pt was on a regimen of: - Metformin 2000 mg at dinnertime - Lantus 63 units at bedtime - Victoza 1.8 mg daily - Amaryl 4 mg 2x a day She had frequent yeast inf when sugars were higher before.   Pt is now on a regimen of: - Metformin 500 mg 2x a day >> 1000 mg 2x a day in 03/2018  Pt checks sugars once a day: - am: 80-90s >> 87, 110-120, 150s; 170 >> last 2 weeks: 115-135 - 2h after b'fast: 145-176 >> n/c - before lunch: 82-167 >> 120s >> n/c - 2h after lunch:  106-166 >> n/c - before dinner: n/c >> 86-110 >> n/c - 2h after dinner: 150-190 >> 83-101 >> 100-120 >> n/c - bedtime: 250-260 >> 83-101 >> 121, 132 - nighttime: n/c Lowest sugar was 34 (after Sx - while still on insulin) >> 87 >> 102; she has hypoglycemia awareness in the 90s. Highest sugar was 128 (forgot Metformin) >> 130 >> 330.  Glucometer: One Touch Ultra 2     -No CKD, last BUN/creatinine:  Lab Results  Component Value Date   BUN 15 04/15/2018   CREATININE 0.63 04/15/2018  On lisinopril. -+ HL; last set of lipids: Lab Results  Component Value Date   CHOL 168 04/15/2018   HDL 42.00 04/15/2018   LDLCALC 91 04/15/2018   LDLDIRECT 112.0 06/21/2014   TRIG 173.0 (H) 04/15/2018   CHOLHDL 4 04/15/2018  On Lipitor 40. - last eye exam was 05/2018: No DR. Dr Emily FilbertGould.  -She denies numbness and tingling in her feet.  On ASA 81.   Restarted HCTZ for increased BP since last OV.  ROS: Constitutional: + weight gain/+ weight loss, no fatigue, no subjective hyperthermia, no subjective hypothermia Eyes: no blurry vision, no xerophthalmia ENT: no sore throat, no nodules palpated in neck, no dysphagia, no odynophagia, no hoarseness Cardiovascular: no CP/no SOB/no palpitations/no leg swelling Respiratory: no cough/no SOB/no wheezing Gastrointestinal: no N/no V/no D/no C/no acid reflux Musculoskeletal: no muscle aches/no joint aches Skin: no rashes, no hair loss Neurological: no tremors/no numbness/no tingling/no dizziness  I reviewed pt's medications, allergies, PMH, social hx, family hx, and changes were documented in the history of present illness. Otherwise, unchanged from my initial visit note.  Past Medical History:  Diagnosis  Date  . Allergy   . Asthma    pt stated treated as ashtma but not really asthma  . Bipolar disorder (HCC)   . Chicken pox   . Complication of anesthesia    oxygen saturation dropped after hysterectomy 2009 at [facility]  . Depression   . Diabetes mellitus   . Dyspnea   . GERD (gastroesophageal reflux disease)   . Hyperlipidemia   . Hypertension    readings  . Hypothyroidism   . Irregular heartbeat    saw dr Myrtis Ser sees cardiology as needed  . Migraine   . Sleep apnea    uses cpap   Past Surgical History:  Procedure Laterality Date  . ANKLE SURGERY  1989   left  . BACK SURGERY  1999  . CESAREAN SECTION     1995   . LAPAROSCOPIC ASSISTED VAGINAL HYSTERECTOMY  2009     BSO fibroids, DUB, pelvic pain  . 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 SHOULDER ARTHROSCOPY WITH OPEN ROTATOR CUFF REPAIR Right 01/02/2017   Procedure: Right shoulder arthroscopy, A-subcromial decompression, mini open rotator cuff repair, open distal clavicle resection, biceps tenodesis;  Surgeon: Beverely Low, MD;  Location: [facility] OR;  Service: Orthopedics;  Laterality: Right;  . THYROIDECTOMY, PARTIAL  2001   removed left  . UMBILICAL HERNIA REPAIR  2003  . WISDOM TOOTH EXTRACTION     Social History   Social History  . Marital Status: Married    Spouse Name: N/A  . Number of Children: 1   Occupational History  . Registrar, Administrator   Social History Main Topics  . Smoking status: Never Smoker   . Smokeless tobacco: Never Used  . Alcohol Use: No  . Drug Use: No   Current Outpatient Medications on File Prior to Visit  Medication Sig Dispense Refill  . ARIPiprazole (ABILIFY) 10 MG tablet Take 10 mg by mouth daily.   1  . atorvastatin (LIPITOR) 40 MG tablet TAKE 1 TABLET BY MOUTH DAILY 90 tablet 1  . azithromycin (ZITHROMAX) 250 MG tablet Take 2 tabs now then 1 daily times 4 days 6 tablet 0  . Biotin 09811 MCG TABS Take 1 tablet by mouth daily.    . Blood Glucose Monitoring Suppl (FREESTYLE LITE) DEVI Use to check sugar 2 times daily 1 each 0  . Calcium Carbonate-Vit D-Min (CALTRATE 600+D PLUS MINIS PO) Take 1 tablet by mouth 3 (three) times daily.    . carbamazepine (TEGRETOL XR) 200 MG 12 hr tablet 1 tablet in the morning, 2 at night    . clobetasol ointment (TEMOVATE) 0.05 % Apply 1 application topically 2 (two) times daily. Do not use for more than 7 days (Patient taking differently: Apply 1 application topically  2 (two) times daily as needed. Do not use for more than 7 days) 60 g 1  . fluticasone (FLONASE) 50 MCG/ACT nasal spray Place 2 sprays into both nostrils daily. (Patient taking differently: Place 2 sprays into both nostrils daily as needed for allergies or rhinitis. ) 16 g 2  . gabapentin (NEURONTIN) 600 MG tablet Take 600 mg by mouth 3 (three) times daily.   1  . glucose blood (FREESTYLE LITE) test strip Use as instructed to check sugar 2 times daily 200 each 5  . hydrochlorothiazide (MICROZIDE) 12.5 MG capsule  TAKE 1 CAPSULE BY MOUTH DAILY. 90 capsule 0  . lamoTRIgine (LAMICTAL) 200 MG tablet Take 200 mg by mouth 2 (two) times daily.    . Lancets (FREESTYLE) lancets Use as instructed to check sugar 2 times daily 200 each 5  . lisinopril (PRINIVIL,ZESTRIL) 20 MG tablet TAKE 1 TABLET (20 MG TOTAL) BY MOUTH DAILY. 90 tablet 1  . metFORMIN (GLUCOPHAGE) 500 MG tablet Take 2 tablets by mouth twice daily with a meal 360 tablet 2  . Multiple Vitamin (MULTIVITAMIN) tablet Take 1 tablet by mouth daily.    . predniSONE (DELTASONE) 5 MG tablet Take 1 tablet (5 mg total) by mouth as directed. Taper 6,5,4,3,2,1 21 tablet 0  . tiaGABine (GABITRIL) 4 MG tablet Take 8 mg by mouth 2 (two) times daily.    . traZODone (DESYREL) 50 MG tablet Take 1 tablet by mouth at bedtime as needed.     No current facility-administered medications on file prior to visit.    Allergies  Allergen Reactions  . Zofran [Ondansetron Hcl] Other (See Comments)    Severe headache   Family History  Problem Relation Age of Onset  . Hyperlipidemia Mother   . Diabetes Mother   . Anxiety disorder Mother   . Heart disease Mother   . Hypertension Father   . Lupus Father   . Heart disease Father   . Stroke Maternal Aunt   . Stroke Paternal Grandfather   . Mental illness Paternal Grandfather   . Breast cancer Neg Hx    PE: BP 126/70   Pulse 78   Ht 5\' 8"  (1.727 m) Comment: measured  Wt 276 lb (125.2 kg)   LMP 01/25/2008   SpO2  95%   BMI 41.97 kg/m  Wt Readings from Last 3 Encounters:  07/26/18 276 lb (125.2 kg)  04/15/18 275 lb 8 oz (125 kg)  02/19/18 275 lb 9.6 oz (125 kg)   Constitutional: overweight, in NAD Eyes: PERRLA, EOMI, no exophthalmos ENT: moist mucous membranes, no thyromegaly, no cervical lymphadenopathy Cardiovascular: RRR, No MRG Respiratory: CTA B Gastrointestinal: abdomen soft, NT, ND, BS+ Musculoskeletal: no deformities, strength intact in all 4 Skin: moist, warm, no rashes Neurological: no tremor with outstretched hands, DTR normal in all 4  ASSESSMENT: 1. DM2, insulin-dependent, uncontrolled, without long-term complications  2. Obesity  3. HL  PLAN:  1. Patient with longstanding, previously uncontrolled diabetes, on the complex antidiabetic regimen, then off medications except metformin after her Roux-en-Y surgery and after losing more than 100 pounds.  However, she started to gain weight before our last visit a year ago and her sugars also started to worsen.  At last visit with PCP, HbA1c was much higher, at 9.9%.  Dr. Beverely Low increased her metformin to 1000 mg twice a day. -At this visit, sugars have improved in the last 2 weeks after she started eliminate sweets from her diet.  She continues on the higher dose of metformin twice a day.  She is also planning to start going to the gym with her husband soon. -At this visit, we discussed about restarting the GLP-1 receptor agonist, to help not only with diabetes control, but also with weight loss and preventing cardiovascular disease.  She agrees.  We will start a low-dose and we may not need to increase the dose if her sugars remain controlled.  Discussed about benefits and possible side effects. -Demonstrated Trulicity pen use - I advised her to:  Patient Instructions  Please continue: - Metformin 1000 mg 2x a  day  Please start Trulicity 0.75 mg weekly. Let me know when you are close to running out to call in the higher dose to your  pharmacy (1.5 mg).  Please come back for a follow-up appointment in 4 months.  - today, HbA1c is 8.5% (improved) - continue checking sugars at different times of the day - check 1x a day, rotating checks - advised for yearly eye exams >> she is UTD - Return to clinic in 4 mo with sugar log    2. Obesity class II -Unfortunately, she continues to gain weight after her gastric bypass surgery (Roux-en-Y). -We will start Trulicity which should also help with weight loss  3. HL - Reviewed latest lipid panel from 03/2018: LDL lower than 100, triglycerides slightly high Lab Results  Component Value Date   CHOL 168 04/15/2018   HDL 42.00 04/15/2018   LDLCALC 91 04/15/2018   LDLDIRECT 112.0 06/21/2014   TRIG 173.0 (H) 04/15/2018   CHOLHDL 4 04/15/2018  - Continues Lipitor 40 without side effects.  Carlus Pavlov, MD PhD [facility] Endocrinology

## 2018-08-02 ENCOUNTER — Encounter: Payer: Self-pay | Admitting: Internal Medicine

## 2018-08-03 MED ORDER — GLUCOSE BLOOD VI STRP: ORAL_STRIP | 5 refills | Status: DC

## 2018-08-03 MED FILL — FREESTYLE LITE TEST STRIP: 90 days supply | Qty: 200 | Fill #0

## 2018-08-09 ENCOUNTER — Encounter: Payer: Self-pay | Admitting: Physician Assistant

## 2018-08-09 ENCOUNTER — Telehealth: Payer: 59 | Admitting: Physician Assistant

## 2018-08-09 DIAGNOSIS — M549 Dorsalgia, unspecified: Secondary | ICD-10-CM

## 2018-08-09 DIAGNOSIS — H1013 Acute atopic conjunctivitis, bilateral: Secondary | ICD-10-CM | POA: Diagnosis not present

## 2018-08-09 MED ORDER — CYCLOBENZAPRINE HCL 10 MG PO TABS: 10.0000 mg | ORAL_TABLET | Freq: Three times a day (TID) | ORAL | 0 refills | Status: DC | PRN

## 2018-08-09 MED FILL — CYCLOBENZAPRINE HCL 10 MG T: 10 | 10 days supply | Qty: 30 | Fill #0

## 2018-08-09 NOTE — Progress Notes (Signed)
We are sorry that you are not feeling well.  Here is how we plan to help!  Based on what you have shared with me it looks like you mostly have acute back pain.  Acute back pain is defined as musculoskeletal pain that can resolve in 1-3 weeks with conservative treatment.  I have prescribed IFlexeril 10 mg every eight hours as needed which is a muscle relaxer  Some patients experience stomach irritation or in increased heartburn with anti-inflammatory drugs.  Please keep in mind that muscle relaxer's can cause fatigue and should not be taken while at work or driving.  Back pain is very common.  The pain often gets better over time.  The cause of back pain is usually not dangerous.  Most people can learn to manage their back pain on their own.    Please be advised that flexeril and gabapentin can cause marked drowsiness. Avoid driving or operating machinery while on both medications.    Home Care  Stay active.  Start with short walks on flat ground if you can.  Try to walk farther each day.  Do not sit, drive or stand in one place for more than 30 minutes.  Do not stay in bed.  Do not avoid exercise or work.  Activity can help your back heal faster.  Be careful when you bend or lift an object.  Bend at your knees, keep the object close to you, and do not twist.  Sleep on a firm mattress.  Lie on your side, and bend your knees.  If you lie on your back, put a pillow under your knees.  Only take medicines as told by your doctor.  Put ice on the injured area.  Put ice in a plastic bag  Place a towel between your skin and the bag  Leave the ice on for 15-20 minutes, 3-4 times a day for the first 2-3 days. 210 After that, you can switch between ice and heat packs.  Ask your doctor about back exercises or massage.  Avoid feeling anxious or stressed.  Find good ways to deal with stress, such as exercise.  Get Help Right Way If:  Your pain does not go away with rest or medicine.  Your  pain does not go away in 1 week.  You have new problems.  You do not feel well.  The pain spreads into your legs.  You cannot control when you poop (bowel movement) or pee (urinate)  You feel sick to your stomach (nauseous) or throw up (vomit)  You have belly (abdominal) pain.  You feel like you may pass out (faint).  If you develop a fever.  Make Sure you:  Understand these instructions.  Will watch your condition  Will get help right away if you are not doing well or get worse.  Your e-visit answers were reviewed by a board certified advanced clinical practitioner to complete your personal care plan.  Depending on the condition, your plan could have included both over the counter or prescription medications.  If there is a problem please reply  once you have received a response from your provider.  Your safety is important to [place].  If you have drug allergies check your prescription carefully.    You can use MyChart to ask questions about today's visit, request a non-urgent call back, or ask for a work or school excuse for 24 hours related to this e-Visit. If it has been greater than 24 hours you will need  to follow up with your provider, or enter a new e-Visit to address those concerns.  You will get an e-mail in the next two days asking about your experience.  I hope that your e-visit has been valuable and will speed your recovery. Thank you for using e-visits.   I have spent 7 min to complete this note- SO

## 2018-08-26 MED FILL — TRULICITY 0.75 MG/0.5 ML PE: 0.75 | 28 days supply | Qty: 2 | Fill #1

## 2018-09-20 ENCOUNTER — Other Ambulatory Visit: Payer: Self-pay | Admitting: Family Medicine

## 2018-09-20 MED FILL — ATORVASTATIN 40 MG TABLET: 40 | 90 days supply | Qty: 90 | Fill #0

## 2018-10-01 ENCOUNTER — Other Ambulatory Visit: Payer: Self-pay | Admitting: Family Medicine

## 2018-10-01 MED FILL — lamoTRIgine 200 MG TABS: 200 | 90 days supply | Qty: 180 | Fill #1

## 2018-10-01 MED FILL — LISINOPRIL 20 MG TABLET: 20 | 90 days supply | Qty: 90 | Fill #0

## 2018-10-01 MED FILL — TRULICITY 0.75 MG/0.5 ML PE: 0.75 | 28 days supply | Qty: 2 | Fill #2

## 2018-10-21 ENCOUNTER — Other Ambulatory Visit: Payer: Self-pay | Admitting: Family Medicine

## 2018-10-21 MED FILL — TEGRETOL XR 200 MG TABLET: 200 | 90 days supply | Qty: 270 | Fill #1

## 2018-10-21 MED FILL — GABAPENTIN 600 MG TABS: 600 | 90 days supply | Qty: 270 | Fill #1

## 2018-10-21 MED FILL — ARIPIPRAZOLE 10 MG TABS: 10 | 90 days supply | Qty: 90 | Fill #1

## 2018-10-21 MED FILL — HYDROCHLOROTHIAZIDE 12.5 MG: 12.5 | 90 days supply | Qty: 90 | Fill #0

## 2018-10-21 MED FILL — metFORMIN HCL 500 MG TABS: 500 | 90 days supply | Qty: 360 | Fill #2

## 2018-10-22 ENCOUNTER — Encounter: Payer: Self-pay | Admitting: Family Medicine

## 2018-10-29 MED FILL — TRULICITY 0.75 MG/0.5 ML PE: 0.75 | 28 days supply | Qty: 2 | Fill #3

## 2018-11-11 DIAGNOSIS — F3132 Bipolar disorder, current episode depressed, moderate: Secondary | ICD-10-CM | POA: Diagnosis not present

## 2018-11-11 DIAGNOSIS — F411 Generalized anxiety disorder: Secondary | ICD-10-CM | POA: Diagnosis not present

## 2018-11-12 MED FILL — traZODone HCL 100 MG TABS: 100 | 90 days supply | Qty: 180 | Fill #0

## 2018-11-12 MED FILL — tiaGABine HCL 4 MG TABS: 4 | 90 days supply | Qty: 360 | Fill #0

## 2018-11-15 MED FILL — FREESTYLE LITE TEST STRIP: 90 days supply | Qty: 200 | Fill #1

## 2018-11-29 ENCOUNTER — Other Ambulatory Visit: Payer: Self-pay

## 2018-11-30 ENCOUNTER — Other Ambulatory Visit: Payer: Self-pay

## 2018-11-30 ENCOUNTER — Encounter: Payer: Self-pay | Admitting: Internal Medicine

## 2018-11-30 ENCOUNTER — Ambulatory Visit: Payer: 59 | Admitting: Internal Medicine

## 2018-11-30 VITALS — BP 122/70 | HR 94 | Ht 68.0 in | Wt 265.0 lb

## 2018-11-30 DIAGNOSIS — E782 Mixed hyperlipidemia: Secondary | ICD-10-CM

## 2018-11-30 DIAGNOSIS — E119 Type 2 diabetes mellitus without complications: Secondary | ICD-10-CM

## 2018-11-30 LAB — POCT GLYCOSYLATED HEMOGLOBIN (HGB A1C): Hemoglobin A1C: 6.7 % — AB (ref 4.0–5.6)

## 2018-11-30 NOTE — Patient Instructions (Addendum)
Please continue: - Metformin 1000 mg 2x a day - Trulicity 1.46 mg weekly  Please come back for a follow-up appointment in 4 months.

## 2018-11-30 NOTE — Addendum Note (Signed)
Addended by: Cardell Peach I on: 11/30/2018 09:53 AM   Modules accepted: Orders

## 2018-11-30 NOTE — Progress Notes (Signed)
Patient ID: [name], female   DOB: Jul 28, 1970, 48 y.o.   MRN: [id number]  HPI: [name] is a 48 y.o.-year-old female, returning for f/u for DM2, dx in 2001, insulin-dependent 2007, uncontrolled, without long term complications. Last visit 4 months ago.  She had Roux-en-Y surgery in 03/2016.  After this, she lost more than 100 pounds but before last visit she had gained 30 pounds back.  Last hemoglobin A1c was: Lab Results  Component Value Date   HGBA1C 8.5 (A) 07/26/2018   HGBA1C 9.9 (H) 04/15/2018   HGBA1C 7.6 08/03/2017   HGBA1C 6.6 (H) 12/30/2016   HGBA1C 6.1 (H) 10/09/2016   HGBA1C 9.6 02/21/2016   HGBA1C 9.2 10/05/2015   HGBA1C 10.4 05/31/2015   HGBA1C 8.8 01/09/2015   HGBA1C 11.0 (H) 06/21/2014   HGBA1C 8.8 (H) 01/03/2014   HGBA1C 9.6 (H) 04/12/2013   HGBA1C 11.0 (H) 11/18/2012   HGBA1C 9.0 (H) 11/12/2011   HGBA1C 9.6 (H) 06/23/2011   Pt was on a regimen of: - Metformin 2000 mg at dinnertime - Lantus 63 units at bedtime - Victoza 1.8 mg daily - Amaryl 4 mg 2x a day She had frequent yeast inf when sugars were higher before.   Pt is now on a regimen of: - Metformin 500 mg 2x a day >> 1000 mg 2x a day -increased in 83/3825 - Trulicity 0.53 mg weekly-added 07/2018 - no GI SEs  Pt checks sugars once a day: - am:  87, 110-120, 150s; 170 >>115-135 >> 114-130, 140 - 2h after b'fast: 145-176 >> n/c - before lunch: 82-167 >> 120s >> n/c - 2h after lunch:  106-166 >> n/c - before dinner: n/c >> 86-110 >> n/c - 2h after dinner: 150-190 >> 83-101 >> 100-120 >> n/c - bedtime: 250-260 >> 83-101 >> 121, 132 >> 130-160 - nighttime: n/c Lowest sugar was 34 (after Sx - while still on insulin) >> 87 >> 102 >> 114; she has hypoglycemia awareness in the 90s. Highest sugar was 128 (forgot Metformin) >> 130 >> 330 >> 160.  Glucometer: One Touch Ultra 2    -No CKD, last BUN/creatinine:  Lab Results  Component Value Date   BUN 15 04/15/2018   CREATININE 0.63 04/15/2018  On  lisinopril. -+ HL; last set of lipids: Lab Results  Component Value Date   CHOL 168 04/15/2018   HDL 42.00 04/15/2018   LDLCALC 91 04/15/2018   LDLDIRECT 112.0 06/21/2014   TRIG 173.0 (H) 04/15/2018   CHOLHDL 4 04/15/2018  On Lipitor 40. - last eye exam was 06/2018: No DR. Dr Delman Cheadle.  - no numbness and tingling in her feet.   On HCTZ for HTN.  ROS: Constitutional: no weight gain/+ weight loss, no fatigue, no subjective hyperthermia, no subjective hypothermia Eyes: no blurry vision, no xerophthalmia ENT: no sore throat, no nodules palpated in neck, no dysphagia, no odynophagia, no hoarseness Cardiovascular: no CP/no SOB/no palpitations/no leg swelling Respiratory: no cough/no SOB/no wheezing Gastrointestinal: no N/no V/no D/no C/no acid reflux Musculoskeletal: no muscle aches/no joint aches Skin: no rashes, no hair loss Neurological: no tremors/no numbness/no tingling/no dizziness  I reviewed pt's medications, allergies, PMH, social hx, family hx, and changes were documented in the history of present illness. Otherwise, unchanged from my initial visit note.  Past Medical History:  Diagnosis Date  . Allergy   . Asthma    pt stated treated as ashtma but not really asthma  . Bipolar disorder ([place])   . Chicken pox   .  Complication of anesthesia    [facility] saturation dropped [facility]  . Depression   . Diabetes mellitus   . Dyspnea   . GERD (gastroesophageal reflux disease)   . Hyperlipidemia   . Hypertension    readings  . Hypothyroidism   . Irregular heartbeat    saw dr Myrtis SerKatz sees cardiology as needed  . Migraine   . Sleep apnea    uses cpap   Past Surgical History:  Procedure Laterality Date  . ANKLE SURGERY  1989   left  . BACK SURGERY  1999  . CESAREAN SECTION     1995  . LAPAROSCOPIC ASSISTED VAGINAL HYSTERECTOMY  2009     BSO fibroids, DUB, pelvic pain  . 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 REPAIR N/A 04/14/2016    Procedure: LAPAROSCOPIC ROUX-EN-Y GASTRIC BYPASS  WITH UPPER ENDOSCOPY;  Surgeon: Glenna FellowsBenjamin Hoxworth, MD;  Location: [facility];  Service: General;  Laterality: N/A;  . SHOULDER ARTHROSCOPY WITH OPEN ROTATOR CUFF REPAIR Right 01/02/2017   Procedure: Right shoulder arthroscopy, A-subcromial decompression, mini open rotator cuff repair, open distal clavicle resection, biceps tenodesis;  Surgeon: Beverely LowNorris, Steve, MD;  [facility];  Service: Orthopedics;  Laterality: Right;  . THYROIDECTOMY, PARTIAL  2001   removed left  . UMBILICAL HERNIA REPAIR  2003  . WISDOM TOOTH EXTRACTION     Social History   Social History  . Marital Status: Married    Spouse Name: N/A  . Number of Children: 1   Occupational History  . Registrar, [employer]   Social History Main Topics  . Smoking status: Never Smoker   . Smokeless tobacco: Never Used  . Alcohol Use: No  . Drug Use: No   Current Outpatient Medications on File Prior to Visit  Medication Sig Dispense Refill  . ARIPiprazole (ABILIFY) 10 MG tablet Take 10 mg by mouth daily.   1  . atorvastatin (LIPITOR) 40 MG tablet TAKE 1 TABLET BY MOUTH DAILY 90 tablet 1  . Biotin 9528410000 MCG TABS Take 1 tablet by mouth daily.    . Blood Glucose Monitoring Suppl (FREESTYLE LITE) DEVI Use to check sugar 2 times daily 1 each 0  . Calcium Carbonate-Vit D-Min (CALTRATE 600+D PLUS MINIS PO) Take 1 tablet by mouth 3 (three) times daily.    . carbamazepine (TEGRETOL XR) 200 MG 12 hr tablet 1 tablet in the morning, 2 at night    . clobetasol ointment (TEMOVATE) 0.05 % Apply 1 application topically 2 (two) times daily. Do not use for more than 7 days (Patient taking differently: Apply 1 application topically 2 (two) times daily as needed. Do not use for more than 7 days) 60 g 1  . cyclobenzaprine (FLEXERIL) 10 MG tablet Take 1 tablet (10 mg total) by mouth 3 (three) times daily as needed for muscle spasms. 30 tablet 0  . Dulaglutide (TRULICITY) 0.75 MG/0.5ML SOPN Inject  0.75 mg in am weekly under skin 4 pen 5  . fluticasone (FLONASE) 50 MCG/ACT nasal spray Place 2 sprays into both nostrils daily. (Patient taking differently: Place 2 sprays into both nostrils daily as needed for allergies or rhinitis. ) 16 g 2  . gabapentin (NEURONTIN) 600 MG tablet Take 600 mg by mouth 3 (three) times daily.   1  . glucose blood (FREESTYLE LITE) test strip Use as instructed to check sugar 2 times daily 200 each 5  . hydrochlorothiazide (MICROZIDE) 12.5 MG capsule TAKE 1 CAPSULE BY MOUTH ONCE DAILY 90  capsule 0  . lamoTRIgine (LAMICTAL) 200 MG tablet Take 200 mg by mouth 2 (two) times daily.    . Lancets (FREESTYLE) lancets Use as instructed to check sugar 2 times daily 200 each 5  . lisinopril (ZESTRIL) 20 MG tablet TAKE 1 TABLET (20 MG TOTAL) BY MOUTH DAILY. 90 tablet 1  . metFORMIN (GLUCOPHAGE) 500 MG tablet Take 2 tablets by mouth twice daily with a meal 360 tablet 2  . Multiple Vitamin (MULTIVITAMIN) tablet Take 1 tablet by mouth daily.    . predniSONE (DELTASONE) 5 MG tablet Take 1 tablet (5 mg total) by mouth as directed. Taper 6,5,4,3,2,1 21 tablet 0  . tiaGABine (GABITRIL) 4 MG tablet Take 8 mg by mouth 2 (two) times daily.    . traZODone (DESYREL) 50 MG tablet Take 1 tablet by mouth at bedtime as needed.     No current facility-administered medications on file prior to visit.    Allergies  Allergen Reactions  . Zofran [Ondansetron Hcl] Other (See Comments)    Severe headache   Family History  Problem Relation Age of Onset  . Hyperlipidemia Mother   . Diabetes Mother   . Anxiety disorder Mother   . Heart disease Mother   . Hypertension Father   . Lupus Father   . Heart disease Father   . Stroke Maternal Aunt   . Stroke Paternal Grandfather   . Mental illness Paternal Grandfather   . Breast cancer Neg Hx    PE: BP 122/70   Pulse 94   Ht  (1.727 m)   Wt 265 lb (120.2 kg)   LMP 01/25/2008   SpO2 96%   BMI 40.29 kg/m  Wt Readings from Last 3  Encounters:  11/30/18 265 lb (120.2 kg)  07/26/18 276 lb (125.2 kg)  04/15/18 275 lb 8 oz (125 kg)   Constitutional: overweight, in NAD Eyes: PERRLA, EOMI, no exophthalmos ENT: moist mucous membranes, no thyromegaly, no cervical lymphadenopathy Cardiovascular: RRR (no tachycardia at the time of the exam), No MRG Respiratory: CTA B Gastrointestinal: abdomen soft, NT, ND, BS+ Musculoskeletal: no deformities, strength intact in all 4 Skin: moist, warm, no rashes Neurological: no tremor with outstretched hands, DTR normal in all 4  ASSESSMENT: 1. DM2, insulin-dependent, uncontrolled, without long-term complications  2. Obesity  3. HL  PLAN:  1. Patient with longstanding, previously uncontrolled diabetes, on a complex antidiabetic regimen, then off medications except metformin after her Roux-en-Y surgery and after losing more than 100 pounds.  However, she started to gain weight afterwards and her HbA1c increased to 9.9%.  At that time, metformin was increased to a target dose of 1000 mg 2x a day.  At last visit, HbA1c improved to 8.5%, but sugars were still high and we added Trulicity.  At that time she was telling me that she plans to start going to the gym with her husband. -This visit, sugars are mostly at goal and she feels much better after adding Trulicity.  She also lost weight on the medication.  She is still taking the low dose of Trulicity, which we will continue for now.  At next visit, we may need to increase this if sugars worsen. - I advised her to:  Patient Instructions  Please continue: - Metformin 1000 mg 2x a day - Trulicity 0.75 mg weekly  Please come back for a follow-up appointment in 4 months.  - today, HbA1c is 6.7% (much better) - continue checking sugars at different times of the day -  check 1x a day, rotating checks - advised for yearly eye exams >> she is UTD - Return to clinic in 4 mo with sugar log     2. Obesity class II -She gained weight after her  gastric bypass surgery (Roux-en-Y)  -At last visit we started a GLP-1 receptor agonist also help with weight loss along with improving her diabetes - lost 11 lbs since last OV  3. HL - Reviewed latest lipid panel from 03/2018: LDL at goal, triglycerides slightly high Lab Results  Component Value Date   CHOL 168 04/15/2018   HDL 42.00 04/15/2018   LDLCALC 91 04/15/2018   LDLDIRECT 112.0 06/21/2014   TRIG 173.0 (H) 04/15/2018   CHOLHDL 4 04/15/2018  - Continues Lipitor 40 without side effects.  Carlus Pavlovristina Holliday Sheaffer, MD PhD [facility]

## 2018-12-14 ENCOUNTER — Other Ambulatory Visit: Payer: Self-pay | Admitting: Family Medicine

## 2018-12-14 MED FILL — CLOBETASOL PROP 0.05% OINT: 0.05 | 7 days supply | Qty: 60 | Fill #0

## 2018-12-14 MED FILL — TRULICITY 0.75 MG/0.5 ML PE: 0.75 | 28 days supply | Qty: 2 | Fill #4

## 2018-12-14 NOTE — Telephone Encounter (Signed)
Last OV 04/15/18 Clobetasol last filled 11/06/16 60g with 1 refill

## 2018-12-28 MED FILL — ATORVASTATIN 40 MG TABLET: 40 | 90 days supply | Qty: 90 | Fill #1

## 2019-01-06 ENCOUNTER — Ambulatory Visit (INDEPENDENT_AMBULATORY_CARE_PROVIDER_SITE_OTHER): Payer: 59 | Admitting: Family Medicine

## 2019-01-06 ENCOUNTER — Other Ambulatory Visit: Payer: Self-pay

## 2019-01-06 ENCOUNTER — Encounter: Payer: Self-pay | Admitting: Family Medicine

## 2019-01-06 VITALS — BP 123/76 | HR 62 | Temp 97.9°F | Resp 16 | Ht 68.0 in | Wt 268.4 lb

## 2019-01-06 DIAGNOSIS — F3178 Bipolar disorder, in full remission, most recent episode mixed: Secondary | ICD-10-CM

## 2019-01-06 DIAGNOSIS — Z0000 Encounter for general adult medical examination without abnormal findings: Secondary | ICD-10-CM

## 2019-01-06 MED ORDER — IPRATROPIUM BROMIDE 0.03 % NA SOLN: 2.0000 | Freq: Two times a day (BID) | NASAL | 12 refills | Status: DC

## 2019-01-06 MED FILL — IPRATROPIUM 0.03% SPRAY: 0.03 | 44 days supply | Qty: 30 | Fill #0

## 2019-01-06 NOTE — Assessment & Plan Note (Signed)
Pt's PE unchanged from previous and WNL w/ exception of obesity.  UTD on GYN, immunizations.  Check labs.  Anticipatory guidance provided.

## 2019-01-06 NOTE — Assessment & Plan Note (Signed)
Chronic problem.  Pt is down 8 lbs since March.  Applauded her efforts at healthy diet and regular exercise.  Will continue to follow.

## 2019-01-06 NOTE — Assessment & Plan Note (Signed)
Following w psych

## 2019-01-06 NOTE — Progress Notes (Signed)
   Subjective:    Patient ID: [name], female    DOB: [birth year], 48 y.o.   MRN: [id number]  HPI CPE- pt is down 8 lbs from March.  UTD on mammo, pap (Dr [name] Heck).  UTD on immunizations.  Due for foot exam.  UTD on eye exam.   Review of Systems Patient reports no vision/ hearing changes, adenopathy,fever, weight change,  persistant/recurrent hoarseness , swallowing issues, chest pain, palpitations, edema, persistant/recurrent cough, hemoptysis, dyspnea (rest/exertional/paroxysmal nocturnal), gastrointestinal bleeding (melena, rectal bleeding), abdominal pain, significant heartburn, bowel changes, GU symptoms (dysuria, hematuria, incontinence), Gyn symptoms (abnormal  bleeding, pain),  syncope, focal weakness, memory loss, numbness & tingling, skin/hair/nail changes, abnormal bruising or bleeding, anxiety, or depression.   + vasomotor rhinitis- occurs when eating.  sxs started ~1 yr ago.    Objective:   Physical Exam General Appearance:    Alert, cooperative, no distress, appears stated age, obese  Head:    Normocephalic, without obvious abnormality, atraumatic  Eyes:    PERRL, conjunctiva/corneas clear, EOM's intact, fundi    benign, both eyes  Ears:    Normal TM's and external ear canals, both ears  Nose:   Nares normal, septum midline, mucosa normal, no drainage    or sinus tenderness  Throat:   Lips, mucosa, and tongue normal; teeth and gums normal  Neck:   Supple, symmetrical, trachea midline, no adenopathy;    Thyroid: no enlargement/tenderness/nodules  Back:     Symmetric, no curvature, ROM normal, no CVA tenderness  Lungs:     Clear to auscultation bilaterally, respirations unlabored  Chest Wall:    No tenderness or deformity   Heart:    Regular rate and rhythm, S1 and S2 normal, no murmur, rub   or gallop  Breast Exam:    Deferred to GYN  Abdomen:     Soft, non-tender, bowel sounds active all four quadrants,    no masses, no organomegaly  Genitalia:    Deferred to GYN   Rectal:    Extremities:   Extremities normal, atraumatic, no cyanosis or edema  Pulses:   2+ and symmetric all extremities  Skin:   Skin color, texture, turgor normal, no rashes or lesions  Lymph nodes:   Cervical, supraclavicular, and axillary nodes normal  Neurologic:   CNII-XII intact, normal strength, sensation and reflexes    throughout          Assessment & Plan:

## 2019-01-06 NOTE — Patient Instructions (Signed)
Follow up in 6 months to recheck BP and cholesterol We'll notify you of your lab results and make any changes if needed Keep up the good work on healthy diet and regular exercise- you're doing great! Add the Ipratropium nasal spray to improve the congestion when eating Call with any questions or concerns Stay Safe!!!

## 2019-01-07 LAB — TSH: TSH: 1.11 u[IU]/mL (ref 0.35–4.50)

## 2019-01-07 LAB — CBC WITH DIFFERENTIAL/PLATELET
Basophils Absolute: 0.1 10*3/uL (ref 0.0–0.1)
Basophils Relative: 0.6 % (ref 0.0–3.0)
Eosinophils Absolute: 0.3 10*3/uL (ref 0.0–0.7)
Eosinophils Relative: 3.4 % (ref 0.0–5.0)
HCT: 38.9 % (ref 36.0–46.0)
Hemoglobin: 13 g/dL (ref 12.0–15.0)
Lymphocytes Relative: 25.8 % (ref 12.0–46.0)
Lymphs Abs: 2.6 10*3/uL (ref 0.7–4.0)
MCHC: 33.5 g/dL (ref 30.0–36.0)
MCV: 89.5 fl (ref 78.0–100.0)
Monocytes Absolute: 0.8 10*3/uL (ref 0.1–1.0)
Monocytes Relative: 7.9 % (ref 3.0–12.0)
Neutro Abs: 6.2 10*3/uL (ref 1.4–7.7)
Neutrophils Relative %: 62.3 % (ref 43.0–77.0)
Platelets: 279 10*3/uL (ref 150.0–400.0)
RBC: 4.35 Mil/uL (ref 3.87–5.11)
RDW: 12.8 % (ref 11.5–15.5)
WBC: 10 10*3/uL (ref 4.0–10.5)

## 2019-01-07 LAB — BASIC METABOLIC PANEL
BUN: 12 mg/dL (ref 6–23)
CO2: 28 mEq/L (ref 19–32)
Calcium: 9.3 mg/dL (ref 8.4–10.5)
Chloride: 96 mEq/L (ref 96–112)
Creatinine, Ser: 0.5 mg/dL (ref 0.40–1.20)
GFR: 132.17 mL/min (ref >60.00)
Glucose, Bld: 89 mg/dL (ref 70–99)
Potassium: 4.2 mEq/L (ref 3.5–5.1)
Sodium: 134 mEq/L — ABNORMAL LOW (ref 135–145)

## 2019-01-07 LAB — LIPID PANEL
Cholesterol: 157 mg/dL (ref 0–200)
HDL: 53.1 mg/dL (ref >39.00)
LDL Cholesterol: 81 mg/dL (ref 0–99)
NonHDL: 103.63
Total CHOL/HDL Ratio: 3
Triglycerides: 114 mg/dL (ref 0.0–149.0)
VLDL: 22.8 mg/dL (ref 0.0–40.0)

## 2019-01-07 LAB — HEPATIC FUNCTION PANEL
ALT: 18 U/L (ref 0–35)
AST: 18 U/L (ref 0–37)
Albumin: 4.3 g/dL (ref 3.5–5.2)
Alkaline Phosphatase: 89 U/L (ref 39–117)
Bilirubin, Direct: 0.1 mg/dL (ref 0.0–0.3)
Total Bilirubin: 0.3 mg/dL (ref 0.2–1.2)
Total Protein: 6.7 g/dL (ref 6.0–8.3)

## 2019-01-10 MED FILL — lamoTRIgine 200 MG TABS: 200 | 90 days supply | Qty: 180 | Fill #0

## 2019-01-10 MED FILL — LISINOPRIL 20 MG TABLET: 20 | 90 days supply | Qty: 90 | Fill #1

## 2019-01-19 ENCOUNTER — Other Ambulatory Visit: Payer: Self-pay | Admitting: Family Medicine

## 2019-01-19 MED FILL — TRULICITY 0.75 MG/0.5 ML PE: 0.75 | 28 days supply | Qty: 2 | Fill #5

## 2019-01-19 MED FILL — TEGRETOL XR 200 MG TABLET: 200 | 90 days supply | Qty: 270 | Fill #0

## 2019-01-19 MED FILL — HYDROCHLOROTHIAZIDE 12.5 MG: 12.5 | 90 days supply | Qty: 90 | Fill #0

## 2019-02-01 ENCOUNTER — Other Ambulatory Visit: Payer: Self-pay | Admitting: Internal Medicine

## 2019-02-01 MED FILL — GABAPENTIN 600 MG TABS: 600 | 90 days supply | Qty: 270 | Fill #0

## 2019-02-01 MED FILL — metFORMIN HCL 500 MG TABS: 500 | 90 days supply | Qty: 360 | Fill #0

## 2019-02-01 MED FILL — ARIPIPRAZOLE 10 MG TABS: 10 | 90 days supply | Qty: 90 | Fill #0

## 2019-02-09 ENCOUNTER — Encounter (HOSPITAL_COMMUNITY): Payer: Self-pay

## 2019-02-11 ENCOUNTER — Encounter: Payer: Self-pay | Admitting: Family Medicine

## 2019-02-11 MED FILL — tiaGABine HCL 4 MG TABS: 4 | 90 days supply | Qty: 360 | Fill #1

## 2019-02-23 DIAGNOSIS — F411 Generalized anxiety disorder: Secondary | ICD-10-CM | POA: Diagnosis not present

## 2019-02-23 DIAGNOSIS — F3132 Bipolar disorder, current episode depressed, moderate: Secondary | ICD-10-CM | POA: Diagnosis not present

## 2019-02-25 ENCOUNTER — Other Ambulatory Visit: Payer: Self-pay | Admitting: Internal Medicine

## 2019-02-25 MED FILL — TRULICITY 0.75 MG/0.5 ML PE: 0.75 | 28 days supply | Qty: 2 | Fill #0

## 2019-03-21 MED FILL — TRULICITY 0.75 MG/0.5 ML PE: 0.75 | 28 days supply | Qty: 2 | Fill #1

## 2019-03-29 ENCOUNTER — Other Ambulatory Visit: Payer: Self-pay | Admitting: Family Medicine

## 2019-03-29 MED FILL — ATORVASTATIN 40 MG TABLET: 40 | 90 days supply | Qty: 90 | Fill #0

## 2019-04-07 ENCOUNTER — Encounter: Payer: Self-pay | Admitting: Internal Medicine

## 2019-04-07 ENCOUNTER — Other Ambulatory Visit: Payer: Self-pay

## 2019-04-07 ENCOUNTER — Ambulatory Visit (INDEPENDENT_AMBULATORY_CARE_PROVIDER_SITE_OTHER): Payer: 59 | Admitting: Internal Medicine

## 2019-04-07 DIAGNOSIS — E782 Mixed hyperlipidemia: Secondary | ICD-10-CM

## 2019-04-07 DIAGNOSIS — E119 Type 2 diabetes mellitus without complications: Secondary | ICD-10-CM | POA: Diagnosis not present

## 2019-04-07 NOTE — Patient Instructions (Signed)
Please continue: - Metformin 1000 mg 2x a day - Trulicity 1.77 mg weekly  Please come back for a follow-up appointment in 4 months.

## 2019-04-07 NOTE — Progress Notes (Signed)
Patient ID: [name], female   DOB: 1970-10-31, 48 y.o.   MRN: [id number]  Patient location: Car, at work My location: Office  Referring Provider: Midge Minium, MD  I connected with the patient on 04/07/19 at  9:31 AM EST by a video enabled telemedicine application and verified that I am speaking with the correct person.   I discussed the limitations of evaluation and management by telemedicine and the availability of in person appointments. The patient expressed understanding and agreed to proceed.   Details of the encounter are shown below.  HPI: [name] is a 48 y.o.-year-old female, presenting for f/u for DM2, dx in 2001, insulin-dependent 2007, uncontrolled, without long term complications. Last visit 4 months ago.  She had Roux-en-Y bypass surgery in 03/2016.  After this, she lost more than 100 pounds but afterwards gained 30 pounds back.  We started Trulicity and she lost 11 pounds afterwards, before last visit.  Reviewed HbA1c levels: Lab Results  Component Value Date   HGBA1C 6.7 (A) 11/30/2018   HGBA1C 8.5 (A) 07/26/2018   HGBA1C 9.9 (H) 04/15/2018   HGBA1C 7.6 08/03/2017   HGBA1C 6.6 (H) 12/30/2016   HGBA1C 6.1 (H) 10/09/2016   HGBA1C 9.6 02/21/2016   HGBA1C 9.2 10/05/2015   HGBA1C 10.4 05/31/2015   HGBA1C 8.8 01/09/2015   HGBA1C 11.0 (H) 06/21/2014   HGBA1C 8.8 (H) 01/03/2014   HGBA1C 9.6 (H) 04/12/2013   HGBA1C 11.0 (H) 11/18/2012   HGBA1C 9.0 (H) 11/12/2011   HGBA1C 9.6 (H) 06/23/2011   Pt was on a regimen of: - Metformin 2000 mg at dinnertime - Lantus 63 units at bedtime - Victoza 1.8 mg daily - Amaryl 4 mg 2x a day She had frequent yeast inf when sugars were higher before.   Pt is now on a regimen of: - Metformin 500 mg 2x a day >> 1000 mg 2x a day -increased in 40/9811 - Trulicity 9.14 mg weekly-added 07/2018-no GI side effects  Pt checks sugars once a day: - am:  115-135 >> 114-130, 140 >> 105-140 - 2h after b'fast: 145-176 >> n/c -  before lunch: 82-167 >> 120s >> n/c >> 110-120 - 2h after lunch:  106-166 >> n/c - before dinner: n/c >> 86-110 >> n/c - 2h after dinner: 83-101 >> 100-120 >> n/c - bedtime: 83-101 >> 121, 132 >> 130-160 >> 130-155 - nighttime: n/c Lowest sugar was 34 (after Sx - while still on insulin) >> .[name].114 >> 105; she has hypoglycemia awareness in the 90s. Highest sugar was 330 >> 160 >> 155.  Glucometer: One Touch Ultra 2    -No CKD, last BUN/creatinine:  Lab Results  Component Value Date   BUN 12 01/06/2019   CREATININE 0.50 01/06/2019  On lisinopril.  She had to decrease the dose due to low blood pressures. -+ HL; last set of lipids: Lab Results  Component Value Date   CHOL 157 01/06/2019   HDL 53.10 01/06/2019   LDLCALC 81 01/06/2019   LDLDIRECT 112.0 06/21/2014   TRIG 114.0 01/06/2019   CHOLHDL 3 01/06/2019  On Lipitor 40. - last eye exam was 06/2018: No DR. Dr Delman Cheadle.  -No numbness and tingling in her feet.   On HCTZ for HTN.  ROS: Constitutional: no weight gain/no weight loss, no fatigue, no subjective hyperthermia, no subjective hypothermia Eyes: no blurry vision, no xerophthalmia ENT: no sore throat, no nodules palpated in neck, no dysphagia, no odynophagia, no hoarseness Cardiovascular: no CP/no SOB/no palpitations/no leg swelling Respiratory:  no cough/no SOB/no wheezing Gastrointestinal: no N/no V/no D/no C/no acid reflux Musculoskeletal: no muscle aches/no joint aches Skin: no rashes, no hair loss Neurological: no tremors/no numbness/no tingling/no dizziness  I reviewed pt's medications, allergies, PMH, social hx, family hx, and changes were documented in the history of present illness. Otherwise, unchanged from my initial visit note.  Past Medical History:  Diagnosis Date  . Allergy   . Asthma    pt stated treated as ashtma but not really asthma  . Bipolar disorder (HCC)   . Chicken pox   . Complication of anesthesia    oxygen saturation dropped after  hysterectomy 2009 at [facility]  . Depression   . Diabetes mellitus   . Dyspnea   . GERD (gastroesophageal reflux disease)   . Hyperlipidemia   . Hypertension    readings  . Hypothyroidism   . Irregular heartbeat    saw dr Myrtis Ser sees cardiology as needed  . Migraine   . Sleep apnea    uses cpap   Past Surgical History:  Procedure Laterality Date  . ANKLE SURGERY  1989   left  . BACK SURGERY  1999  . CESAREAN SECTION     1995  . LAPAROSCOPIC ASSISTED VAGINAL HYSTERECTOMY  2009     BSO fibroids, DUB, pelvic pain  . 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 SHOULDER ARTHROSCOPY WITH OPEN ROTATOR CUFF REPAIR Right 01/02/2017   Procedure: Right shoulder arthroscopy, A-subcromial decompression, mini open rotator cuff repair, open distal clavicle resection, biceps tenodesis;  Surgeon: Beverely Low, MD;  Location: [facility] OR;  Service: Orthopedics;  Laterality: Right;  . THYROIDECTOMY, PARTIAL  2001   removed left  . UMBILICAL HERNIA REPAIR  2003  . WISDOM TOOTH EXTRACTION     Social History   Social History  . Marital Status: Married    Spouse Name: N/A  . Number of Children: 1   Occupational History  . Registrar, Administrator   Social History Main Topics  . Smoking status: Never Smoker   . Smokeless tobacco: Never Used  . Alcohol Use: No  . Drug Use: No   Current Outpatient Medications on File Prior to Visit  Medication Sig Dispense Refill  . ARIPiprazole (ABILIFY) 10 MG tablet Take 10 mg by mouth daily.   1  . atorvastatin (LIPITOR) 40 MG tablet TAKE 1 TABLET BY MOUTH DAILY 90 tablet 1  . Biotin 16109 MCG TABS Take 1 tablet by mouth daily.    . Blood Glucose Monitoring Suppl (FREESTYLE LITE) DEVI Use to check sugar 2 times daily 1 each 0  . Calcium Carbonate-Vit D-Min  (CALTRATE 600+D PLUS MINIS PO) Take 1 tablet by mouth 3 (three) times daily.    . carbamazepine (TEGRETOL XR) 200 MG 12 hr tablet 1 tablet in the morning, 2 at night    . clobetasol ointment (TEMOVATE) 0.05 % APPLY 1 APPLICATION TOPICALLY 2 TIMES DAILY. DO NOT USE FOR MORE THAN 7 DAYS 60 g 1  . cyclobenzaprine (FLEXERIL) 10 MG tablet Take 1 tablet (10 mg total) by mouth 3 (three) times daily as needed for muscle spasms. 30 tablet 0  . fluticasone (FLONASE) 50 MCG/ACT nasal spray Place 2 sprays into both nostrils daily. (Patient taking differently: Place 2 sprays into both nostrils daily as needed for allergies or rhinitis. )  16 g 2  . gabapentin (NEURONTIN) 600 MG tablet Take 600 mg by mouth 3 (three) times daily.   1  . glucose blood (FREESTYLE LITE) test strip Use as instructed to check sugar 2 times daily 200 each 5  . hydrochlorothiazide (MICROZIDE) 12.5 MG capsule TAKE 1 CAPSULE BY MOUTH ONCE DAILY 90 capsule 0  . ipratropium (ATROVENT) 0.03 % nasal spray Place 2 sprays into both nostrils every 12 (twelve) hours. 30 mL 12  . lamoTRIgine (LAMICTAL) 200 MG tablet Take 200 mg by mouth 2 (two) times daily.    . Lancets (FREESTYLE) lancets Use as instructed to check sugar 2 times daily 200 each 5  . lisinopril (ZESTRIL) 20 MG tablet TAKE 1 TABLET (20 MG TOTAL) BY MOUTH DAILY. 90 tablet 1  . metFORMIN (GLUCOPHAGE) 500 MG tablet TAKE 2 TABLETS BY MOUTH TWICE A DAY WITH A MEAL 360 tablet 2  . Multiple Vitamin (MULTIVITAMIN) tablet Take 1 tablet by mouth daily.    . tiaGABine (GABITRIL) 4 MG tablet Take 8 mg by mouth 2 (two) times daily.    . traZODone (DESYREL) 100 MG tablet     . TRULICITY 0.75 MG/0.5ML SOPN INJECT 0.75 MG IN AM WEEKLY UNDER SKIN 2 mL 5   No current facility-administered medications on file prior to visit.    Allergies  Allergen Reactions  . Zofran [Ondansetron Hcl] Other (See Comments)    Severe headache   Family History  Problem Relation Age of Onset  . Hyperlipidemia  Mother   . Diabetes Mother   . Anxiety disorder Mother   . Heart disease Mother   . Hypertension Father   . Lupus Father   . Heart disease Father   . Stroke Maternal Aunt   . Stroke Paternal Grandfather   . Mental illness Paternal Grandfather   . Breast cancer Neg Hx    PE: LMP 01/25/2008  Wt Readings from Last 3 Encounters:  01/06/19 268 lb 6 oz (121.7 kg)  11/30/18 265 lb (120.2 kg)  07/26/18 276 lb (125.2 kg)   Constitutional:  in NAD  The physical exam was not performed (virtual visit).  ASSESSMENT: 1. DM2, insulin-dependent, uncontrolled, without long-term complications  2. Obesity  3. HL  PLAN:  1. Patient with longstanding, previously uncontrolled diabetes, on a complex antidiabetic regimen, then off medications except Metformin after her Roux-en-Y gastric surgery in 2017 and losing more than 100 pounds.  However, she started to gain weight afterwards and HbA1c increased to 9.9%.  At that time, we increased Metformin and her HbA1c improved but sugars were still high so we added Trulicity.  At last visit, sugars were much better and her HbA1c decreased to 6.7%. -At this visit, sugars are stable, mostly at goal.  In the morning, she occasionally has sugars up to 140s and she noticed that these are connected to eating yogurt at night.  The yogurt that she eats has 18 g of carbs.  If she has another snack, her sugars already in the 10 5-1 20 in the morning.  We discussed about stopping yogurt or moving it earlier in the day. -For now, we will continue the current regimen.  We discussed that we can increase the dose of Trulicity further if her sugars become uncontrolled, as recently the FDA approved more doses - I advised her to:  Patient Instructions  Please continue: - Metformin 1000 mg 2x a day - Trulicity 0.75 mg weekly  Please come back for a follow-up appointment in 4  months.  - we will check her HbA1c at next OV - advised to check sugars at different times of the  day - 1x a day, rotating check times - advised for yearly eye exams >> she is UTD - return to clinic in 4 months     2. Obesity class III -She gained weight after her Roux-en-Y gastric bypass surgery -We started on a low dose of GLP-1 receptor agonist to help with weight loss along with improving her diabetes.  She continues on this. -Before last visit, she lost 11 pounds, now lost 5 lbs since 12/2018  3. HL - Reviewed latest lipid panel from 12/2018: All fractions at goal Lab Results  Component Value Date   CHOL 157 01/06/2019   HDL 53.10 01/06/2019   LDLCALC 81 01/06/2019   LDLDIRECT 112.0 06/21/2014   TRIG 114.0 01/06/2019   CHOLHDL 3 01/06/2019  - Continues Lipitor 40 without side effects.  Carlus Pavlov, MD PhD [facility] Endocrinology

## 2019-04-08 DIAGNOSIS — H35372 Puckering of macula, left eye: Secondary | ICD-10-CM | POA: Diagnosis not present

## 2019-04-14 ENCOUNTER — Ambulatory Visit (INDEPENDENT_AMBULATORY_CARE_PROVIDER_SITE_OTHER): Payer: 59 | Admitting: Family Medicine

## 2019-04-14 ENCOUNTER — Other Ambulatory Visit: Payer: Self-pay

## 2019-04-14 ENCOUNTER — Encounter: Payer: Self-pay | Admitting: Family Medicine

## 2019-04-14 VITALS — BP 115/63 | HR 59 | Ht 69.0 in | Wt 263.0 lb

## 2019-04-14 DIAGNOSIS — I10 Essential (primary) hypertension: Secondary | ICD-10-CM

## 2019-04-14 MED ORDER — LISINOPRIL 5 MG PO TABS: 5.0000 mg | ORAL_TABLET | Freq: Every day | ORAL | 3 refills | Status: DC

## 2019-04-14 MED FILL — LISINOPRIL 5 MG TABLET: 5 | 30 days supply | Qty: 30 | Fill #0

## 2019-04-14 NOTE — Progress Notes (Signed)
I have discussed the procedure for the virtual visit with the patient who has given consent to proceed with assessment and treatment.   Glucose 129  [name], CMA

## 2019-04-14 NOTE — Progress Notes (Signed)
Virtual Visit via Video   I connected with patient on 04/14/19 at  4:00 PM EST by a video enabled telemedicine application and verified that I am speaking with the correct person using two identifiers.  Location patient: Home Location provider: Acupuncturist, Office Persons participating in the virtual visit: Patient, Provider, [place] (Jess B)  I discussed the limitations of evaluation and management by telemedicine and the availability of in person appointments. The patient expressed understanding and agreed to proceed.  Subjective:   HPI:   Hypotension- 'my blood pressure keeps dropping'.  Has been dropping intermittently since Lisinopril was decreased to 10mg  on 9/18.  Pt is still on HCTZ.  She attempted to stop HCTZ but BP jumped to 160s/90s.  Pt has had 4 episodes at work over the last 4-5 months, most recently today when it dropped to 80/40.  BP started gradually improving after 90 minutes.  Pt felt very sleepy.  Was driven home from work and took a nap.  ROS:   See pertinent positives and negatives per HPI.  Patient Active Problem List   Diagnosis Date Noted  . Type 2 diabetes mellitus without complications ([place]) 43/32/9518  . Hypersomnia with sleep apnea 06/20/2015  . Right lumbar radiculopathy 03/13/2014  . 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 Benign paroxysmal positional vertigo 02/03/2013  . General medical examination 08/06/2011  . SINUS TACHYCARDIA 08/22/2008  . GERD 08/22/2008  . HYPERLIPIDEMIA, MIXED 05/25/2007  . Morbid obesity ([place]) 05/25/2007  . Bipolar disorder ([place]) 05/25/2007  . MIGRAINE, CHRONIC 05/25/2007  . Essential hypertension 05/25/2007  . Allergic rhinitis 05/25/2007  . Obstructive sleep apnea 05/17/2007    Social History   Tobacco Use  . Smoking status: Never Smoker  . Smokeless tobacco: Never Used  Substance Use Topics  . Alcohol use: No    Alcohol/week: 0.0 standard drinks    Current Outpatient Medications:  .  ARIPiprazole  (ABILIFY) 10 MG tablet, Take 10 mg by mouth daily. , Disp: , Rfl: 1 .  atorvastatin (LIPITOR) 40 MG tablet, TAKE 1 TABLET BY MOUTH DAILY, Disp: 90 tablet, Rfl: 1 .  Biotin 10000 MCG TABS, Take 1 tablet by mouth daily., Disp: , Rfl:  .  Blood Glucose Monitoring Suppl (FREESTYLE LITE) DEVI, Use to check sugar 2 times daily, Disp: 1 each, Rfl: 0 .  Calcium Carbonate-Vit D-Min (CALTRATE 600+D PLUS MINIS PO), Take 1 tablet by mouth 3 (three) times daily., Disp: , Rfl:  .  carbamazepine (TEGRETOL XR) 200 MG 12 hr tablet, 1 tablet in the morning, 2 at night, Disp: , Rfl:  .  clobetasol ointment (TEMOVATE) 8.41 %, APPLY 1 APPLICATION TOPICALLY 2 TIMES DAILY. DO NOT USE FOR MORE THAN 7 DAYS, Disp: 60 g, Rfl: 1 .  cyclobenzaprine (FLEXERIL) 10 MG tablet, Take 1 tablet (10 mg total) by mouth 3 (three) times daily as needed for muscle spasms., Disp: 30 tablet, Rfl: 0 .  fluticasone (FLONASE) 50 MCG/ACT nasal spray, Place 2 sprays into both nostrils daily. (Patient taking differently: Place 2 sprays into both nostrils daily as needed for allergies or rhinitis. ), Disp: 16 g, Rfl: 2 .  gabapentin (NEURONTIN) 600 MG tablet, Take 600 mg by mouth 3 (three) times daily. , Disp: , Rfl: 1 .  glucose blood (FREESTYLE LITE) test strip, Use as instructed to check sugar 2 times daily, Disp: 200 each, Rfl: 5 .  hydrochlorothiazide (MICROZIDE) 12.5 MG capsule, TAKE 1 CAPSULE BY MOUTH ONCE DAILY, Disp: 90 capsule, Rfl: 0 .  ipratropium (ATROVENT) 0.03 % nasal spray, Place 2 sprays into both nostrils every 12 (twelve) hours., Disp: 30 mL, Rfl: 12 .  lamoTRIgine (LAMICTAL) 200 MG tablet, Take 200 mg by mouth 2 (two) times daily., Disp: , Rfl:  .  Lancets (FREESTYLE) lancets, Use as instructed to check sugar 2 times daily, Disp: 200 each, Rfl: 5 .  lisinopril (ZESTRIL) 20 MG tablet, TAKE 1 TABLET (20 MG TOTAL) BY MOUTH DAILY., Disp: 90 tablet, Rfl: 1 .  metFORMIN (GLUCOPHAGE) 500 MG tablet, TAKE 2 TABLETS BY MOUTH TWICE A DAY  WITH A MEAL, Disp: 360 tablet, Rfl: 2 .  Multiple Vitamin (MULTIVITAMIN) tablet, Take 1 tablet by mouth daily., Disp: , Rfl:  .  tiaGABine (GABITRIL) 4 MG tablet, Take 8 mg by mouth 2 (two) times daily., Disp: , Rfl:  .  traZODone (DESYREL) 100 MG tablet, , Disp: , Rfl:  .  TRULICITY 0.75 MG/0.5ML SOPN, INJECT 0.75 MG IN AM WEEKLY UNDER SKIN, Disp: 2 mL, Rfl: 5  Allergies  Allergen Reactions  . Zofran [Ondansetron Hcl] Other (See Comments)    Severe headache    Objective:   BP 115/63   Pulse (!) 59   Ht 5\' 9"  (1.753 m)   Wt 263 lb (119.3 kg)   LMP 01/25/2008   BMI 38.84 kg/m   AAOx3, NAD Obese NCAT, EOMI No obvious CN deficits Coloring WNL Pt is able to speak clearly, coherently without shortness of breath or increased work of breathing.  Thought process is linear.  Mood is appropriate.   Assessment and Plan:   HTN- pt is actually having episodes of hypotension.  She admits to poor water intake recently.  She did try and stop her HCTZ on her own but BP increased to 160s/90s.  She restarted HCTZ daily and has had episodic low BPs.  Today was the lowest at 80/40.  BP has rebounded to 115/63.  Will further decrease Lisinopril to 5mg  daily.  Will attempt to avoid d/c'ing all together due to the renal protection it provides.  Will continue her HCTZ daily as this seems to be the work horse for her BP control.  Pt expressed understanding and is in agreement w/ plan.  Will follow closely.   03/26/2008, MD 04/14/2019

## 2019-04-20 DIAGNOSIS — H43812 Vitreous degeneration, left eye: Secondary | ICD-10-CM | POA: Diagnosis not present

## 2019-04-22 ENCOUNTER — Other Ambulatory Visit: Payer: Self-pay | Admitting: Family Medicine

## 2019-04-22 MED FILL — TRULICITY 0.75 MG/0.5 ML PE: 0.75 | 28 days supply | Qty: 2 | Fill #2

## 2019-04-22 MED FILL — lamoTRIgine 200 MG TABS: 200 | 90 days supply | Qty: 180 | Fill #0

## 2019-04-22 MED FILL — TEGRETOL XR 200 MG TABLET: 200 | 90 days supply | Qty: 270 | Fill #1

## 2019-04-25 MED FILL — HYDROCHLOROTHIAZIDE 12.5 MG: 12.5 | 90 days supply | Qty: 90 | Fill #0

## 2019-04-28 ENCOUNTER — Other Ambulatory Visit: Payer: Self-pay

## 2019-04-28 ENCOUNTER — Ambulatory Visit (INDEPENDENT_AMBULATORY_CARE_PROVIDER_SITE_OTHER): Payer: 59 | Admitting: Family Medicine

## 2019-04-28 ENCOUNTER — Encounter: Payer: Self-pay | Admitting: Family Medicine

## 2019-04-28 VITALS — BP 128/80 | HR 77

## 2019-04-28 DIAGNOSIS — I10 Essential (primary) hypertension: Secondary | ICD-10-CM | POA: Diagnosis not present

## 2019-04-28 MED FILL — ARIPIPRAZOLE 10 MG TABS: 10 | 90 days supply | Qty: 90 | Fill #1

## 2019-04-28 MED FILL — metFORMIN HCL 500 MG TABS: 500 | 90 days supply | Qty: 360 | Fill #1

## 2019-04-28 NOTE — Progress Notes (Signed)
I have discussed the procedure for the virtual visit with the patient who has given consent to proceed with assessment and treatment.   Vianne Grieshop L Asiah Browder, CMA     

## 2019-04-28 NOTE — Progress Notes (Signed)
Virtual Visit via Video   I connected with patient on 04/28/19 at  4:00 PM EST by a video enabled telemedicine application and verified that I am speaking with the correct person using two identifiers.  Location patient: Home Location provider: Acupuncturist, Office Persons participating in the virtual visit: Patient, Provider, [place] (Jess B)  I discussed the limitations of evaluation and management by telemedicine and the availability of in person appointments. The patient expressed understanding and agreed to proceed.  Subjective:   HPI:   Hypotension- pt's Lisinopril was decreased to 5mg  at last visit.  Remains on HCTZ daily.  Pt reports feeling a lot better than she was previously.  No longer having symptomatic low BPs.  Dizziness is better, weakness is better.  Energy level is better.  ROS:   See pertinent positives and negatives per HPI.  Patient Active Problem List   Diagnosis Date Noted  . Type 2 diabetes mellitus without complications ([place]) 81/85/6314  . Hypersomnia with sleep apnea 06/20/2015  . Right lumbar radiculopathy 03/13/2014  . 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 Benign paroxysmal positional vertigo 02/03/2013  . General medical examination 08/06/2011  . SINUS TACHYCARDIA 08/22/2008  . GERD 08/22/2008  . HYPERLIPIDEMIA, MIXED 05/25/2007  . Morbid obesity ([place]) 05/25/2007  . Bipolar disorder ([place]) 05/25/2007  . MIGRAINE, CHRONIC 05/25/2007  . Essential hypertension 05/25/2007  . Allergic rhinitis 05/25/2007  . Obstructive sleep apnea 05/17/2007    Social History   Tobacco Use  . Smoking status: Never Smoker  . Smokeless tobacco: Never Used  Substance Use Topics  . Alcohol use: No    Alcohol/week: 0.0 standard drinks    Current Outpatient Medications:  .  ARIPiprazole (ABILIFY) 10 MG tablet, Take 10 mg by mouth daily. , Disp: , Rfl: 1 .  atorvastatin (LIPITOR) 40 MG tablet, TAKE 1 TABLET BY MOUTH DAILY, Disp: 90 tablet, Rfl: 1 .  Biotin  10000 MCG TABS, Take 1 tablet by mouth daily., Disp: , Rfl:  .  Blood Glucose Monitoring Suppl (FREESTYLE LITE) DEVI, Use to check sugar 2 times daily, Disp: 1 each, Rfl: 0 .  Calcium Carbonate-Vit D-Min (CALTRATE 600+D PLUS MINIS PO), Take 1 tablet by mouth 3 (three) times daily., Disp: , Rfl:  .  carbamazepine (TEGRETOL XR) 200 MG 12 hr tablet, 1 tablet in the morning, 2 at night, Disp: , Rfl:  .  clobetasol ointment (TEMOVATE) 9.70 %, APPLY 1 APPLICATION TOPICALLY 2 TIMES DAILY. DO NOT USE FOR MORE THAN 7 DAYS, Disp: 60 g, Rfl: 1 .  cyclobenzaprine (FLEXERIL) 10 MG tablet, Take 1 tablet (10 mg total) by mouth 3 (three) times daily as needed for muscle spasms., Disp: 30 tablet, Rfl: 0 .  fluticasone (FLONASE) 50 MCG/ACT nasal spray, Place 2 sprays into both nostrils daily. (Patient taking differently: Place 2 sprays into both nostrils daily as needed for allergies or rhinitis. ), Disp: 16 g, Rfl: 2 .  gabapentin (NEURONTIN) 600 MG tablet, Take 600 mg by mouth 3 (three) times daily. , Disp: , Rfl: 1 .  glucose blood (FREESTYLE LITE) test strip, Use as instructed to check sugar 2 times daily, Disp: 200 each, Rfl: 5 .  hydrochlorothiazide (MICROZIDE) 12.5 MG capsule, TAKE 1 CAPSULE BY MOUTH ONCE DAILY, Disp: 90 capsule, Rfl: 0 .  ipratropium (ATROVENT) 0.03 % nasal spray, Place 2 sprays into both nostrils every 12 (twelve) hours., Disp: 30 mL, Rfl: 12 .  lamoTRIgine (LAMICTAL) 200 MG tablet, Take 200 mg by mouth 2 (two)  times daily., Disp: , Rfl:  .  Lancets (FREESTYLE) lancets, Use as instructed to check sugar 2 times daily, Disp: 200 each, Rfl: 5 .  lisinopril (ZESTRIL) 5 MG tablet, Take 1 tablet (5 mg total) by mouth daily., Disp: 30 tablet, Rfl: 3 .  metFORMIN (GLUCOPHAGE) 500 MG tablet, TAKE 2 TABLETS BY MOUTH TWICE A DAY WITH A MEAL, Disp: 360 tablet, Rfl: 2 .  Multiple Vitamin (MULTIVITAMIN) tablet, Take 1 tablet by mouth daily., Disp: , Rfl:  .  tiaGABine (GABITRIL) 4 MG tablet, Take 8 mg by  mouth 2 (two) times daily., Disp: , Rfl:  .  traZODone (DESYREL) 100 MG tablet, , Disp: , Rfl:  .  TRULICITY 0.75 MG/0.5ML SOPN, INJECT 0.75 MG IN AM WEEKLY UNDER SKIN, Disp: 2 mL, Rfl: 5  Allergies  Allergen Reactions  . Zofran [Ondansetron Hcl] Other (See Comments)    Severe headache    Objective:   BP 128/80   Pulse 77   LMP 01/25/2008  AAOx3, NAD Obese NCAT, EOMI No obvious CN deficits Coloring WNL Pt is able to speak clearly, coherently without shortness of breath or increased work of breathing.  Thought process is linear.  Mood is appropriate.   Assessment and Plan:   HTN/Hypotension- BP is well controlled today and she no longer is having hypotensive episodes since we decreased the Lisinopril.  Energy is better, less fatigued.  No med changes at this time.  Will follow.   Neena Rhymes, MD 04/28/2019

## 2019-05-11 MED FILL — tiaGABine HCL 4 MG TABS: 4 | 90 days supply | Qty: 360 | Fill #0

## 2019-05-11 MED FILL — LISINOPRIL 5 MG TABLET: 5 | 30 days supply | Qty: 30 | Fill #1

## 2019-05-24 MED FILL — TRULICITY 0.75 MG/0.5 ML PE: 0.75 | 28 days supply | Qty: 2 | Fill #3

## 2019-06-02 DIAGNOSIS — Z9884 Bariatric surgery status: Secondary | ICD-10-CM | POA: Diagnosis not present

## 2019-06-09 MED FILL — LISINOPRIL 5 MG TABLET: 5 | 60 days supply | Qty: 60 | Fill #2

## 2019-06-23 MED FILL — ATORVASTATIN 40 MG TABLET: 40 | 90 days supply | Qty: 90 | Fill #1

## 2019-06-24 MED FILL — TRULICITY 0.75 MG/0.5 ML PE: 0.75 | 28 days supply | Qty: 2 | Fill #4

## 2019-06-24 MED FILL — GABAPENTIN 600 MG TABLET: 600 | 90 days supply | Qty: 270 | Fill #1

## 2019-06-29 ENCOUNTER — Other Ambulatory Visit: Payer: Self-pay

## 2019-07-01 ENCOUNTER — Other Ambulatory Visit: Payer: Self-pay

## 2019-07-01 ENCOUNTER — Ambulatory Visit (INDEPENDENT_AMBULATORY_CARE_PROVIDER_SITE_OTHER): Payer: 59 | Admitting: Obstetrics & Gynecology

## 2019-07-01 ENCOUNTER — Encounter: Payer: Self-pay | Admitting: Obstetrics & Gynecology

## 2019-07-01 VITALS — BP 114/70 | HR 72 | Temp 97.1°F | Resp 12 | Ht 67.75 in | Wt 270.0 lb

## 2019-07-01 DIAGNOSIS — Z01419 Encounter for gynecological examination (general) (routine) without abnormal findings: Secondary | ICD-10-CM

## 2019-07-01 NOTE — Progress Notes (Signed)
49 y.o. G80P1001 Married White or Caucasian female here for annual exam.  Doing well.  Denies vaginal bleeding.  Being followed by Dr. Cruzita Lederer every 3 months.  Had virtual visit with last one.  Has appt with Dr. Bennye Alm next week.  Will have HbA1C repeated at that time.    Patient's last menstrual period was 01/25/2008.          Sexually active: Yes.    The current method of family planning is status post hysterectomy.    Exercising: No.  The patient does not participate in regular exercise at present. Smoker:  no  Health Maintenance: Pap:  2009 Normal  History of abnormal Pap:  no MMG:  06/22/18 BIRADS 1 negative/density b.  Pt awa Colonoscopy:  Stool test done 02/25/18 - negative BMD:   n/a TDaP:  02/19/2018 Pneumonia vaccine(s):  Pneumovax 07/25/01 Shingrix:   no Hep C testing: n/a Screening Labs: PCP   reports that she has never smoked. She has never used smokeless tobacco. She reports that she does not drink alcohol or use drugs.  Past Medical History:  Diagnosis Date  . Allergy   . Asthma    pt stated treated as ashtma but not really asthma  . Bipolar disorder ([place])   . Chicken pox   . Complication of anesthesia    oxygen saturation dropped after hysterectomy 2009 at [facility]  . Depression   . Diabetes mellitus   . Dyspnea   . GERD (gastroesophageal reflux disease)   . Hyperlipidemia   . Hypertension    readings  . Hypothyroidism   . Irregular heartbeat    saw dr Ron Parker sees cardiology as needed  . Migraine   . Sleep apnea    uses cpap    Past Surgical History:  Procedure Laterality Date  . [place]   left  . BACK SURGERY  1999  . CESAREAN SECTION     1995  . LAPAROSCOPIC ASSISTED VAGINAL HYSTERECTOMY  2009     BSO fibroids, DUB, pelvic pain  . 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 REPAIR N/A 04/14/2016   Procedure: LAPAROSCOPIC ROUX-EN-Y GASTRIC BYPASS  WITH UPPER ENDOSCOPY;  Surgeon: Excell Seltzer, MD;  Location: [facility];  Service:  General;  Laterality: N/A;  . SHOULDER ARTHROSCOPY WITH OPEN ROTATOR CUFF REPAIR Right 01/02/2017   Procedure: Right shoulder arthroscopy, A-subcromial decompression, mini open rotator cuff repair, open distal clavicle resection, biceps tenodesis;  Surgeon: Netta Cedars, MD;  Location: [place];  Service: Orthopedics;  Laterality: Right;  . THYROIDECTOMY, PARTIAL  2001   removed left  . UMBILICAL HERNIA REPAIR  2003  . WISDOM TOOTH EXTRACTION      Current Outpatient Medications  Medication Sig Dispense Refill  . ARIPiprazole (ABILIFY) 10 MG tablet Take 10 mg by mouth daily.   1  . atorvastatin (LIPITOR) 40 MG tablet TAKE 1 TABLET BY MOUTH DAILY 90 tablet 1  . Biotin 10000 MCG TABS Take 1 tablet by mouth daily.    . Blood Glucose Monitoring Suppl (FREESTYLE LITE) DEVI Use to check sugar 2 times daily 1 each 0  . Calcium Carbonate-Vit D-Min (CALTRATE 600+D PLUS MINIS PO) Take 1 tablet by mouth 3 (three) times daily.    . carbamazepine (TEGRETOL XR) 200 MG 12 hr tablet 1 tablet in the morning, 2 at night    . clobetasol ointment (TEMOVATE) 1.19 % APPLY 1 APPLICATION TOPICALLY 2 TIMES DAILY. DO NOT USE FOR MORE THAN 7 DAYS 60 g 1  .  gabapentin (NEURONTIN) 600 MG tablet Take 600 mg by mouth 3 (three) times daily.   1  . glucose blood (FREESTYLE LITE) test strip Use as instructed to check sugar 2 times daily 200 each 5  . hydrochlorothiazide (MICROZIDE) 12.5 MG capsule TAKE 1 CAPSULE BY MOUTH ONCE DAILY 90 capsule 0  . ipratropium (ATROVENT) 0.03 % nasal spray Place 2 sprays into both nostrils every 12 (twelve) hours. 30 mL 12  . lamoTRIgine (LAMICTAL) 200 MG tablet Take 200 mg by mouth 2 (two) times daily.    . Lancets (FREESTYLE) lancets Use as instructed to check sugar 2 times daily 200 each 5  . lisinopril (ZESTRIL) 5 MG tablet Take 1 tablet (5 mg total) by mouth daily. 30 tablet 3  . metFORMIN (GLUCOPHAGE) 500 MG tablet TAKE 2 TABLETS BY MOUTH TWICE A DAY WITH A MEAL 360 tablet 2  . Multiple  Vitamin (MULTIVITAMIN) tablet Take 1 tablet by mouth daily.    . tiaGABine (GABITRIL) 4 MG tablet Take 8 mg by mouth 2 (two) times daily.    . TRULICITY 0.75 MG/0.5ML SOPN INJECT 0.75 MG IN AM WEEKLY UNDER SKIN 2 mL 5   No current facility-administered medications for this visit.    Family History  Problem Relation Age of Onset  . Hyperlipidemia Mother   . Diabetes Mother   . Anxiety disorder Mother   . Heart disease Mother   . Hypertension Father   . Lupus Father   . Heart disease Father   . Stroke Maternal Aunt   . Stroke Paternal Grandfather   . Mental illness Paternal Grandfather   . Breast cancer Neg Hx     Review of Systems  All other systems reviewed and are negative.   Exam:   BP 114/70 (BP Location: Right Arm, Patient Position: Sitting, Cuff Size: Normal)   Pulse 72   Temp (!) 97.1 F (36.2 C) (Temporal)   Resp 12   Ht 5' 7.75" (1.721 m)   Wt 270 lb (122.5 kg)   LMP 01/25/2008   BMI 41.36 kg/m   Weight: -5#  Height: 5' 7.75" (172.1 cm)  Ht Readings from Last 3 Encounters:  07/01/19 5' 7.75" (1.721 m)  04/14/19 5\' 9"  (1.753 m)  01/06/19 5\' 8"  (1.727 m)    General appearance: alert, cooperative and appears stated age Head: Normocephalic, without obvious abnormality, atraumatic Neck: no adenopathy, supple, symmetrical, trachea midline and thyroid normal to inspection and palpation Lungs: clear to auscultation bilaterally Breasts: normal appearance, no masses or tenderness Heart: regular rate and rhythm Abdomen: soft, non-tender; bowel sounds normal; no masses,  no organomegaly Extremities: extremities normal, atraumatic, no cyanosis or edema Skin: Skin color, texture, turgor normal. No rashes or lesions Lymph nodes: Cervical, supraclavicular, and axillary nodes normal. No abnormal inguinal nodes palpated Neurologic: Grossly normal   Pelvic: External genitalia:  no lesions              Urethra:  normal appearing urethra with no masses, tenderness or  lesions              Bartholins and Skenes: normal                 Vagina: normal appearing vagina with normal color and discharge, no lesions              Cervix: absent              Pap taken: No. Bimanual Exam:  Uterus:  uterus absent  Adnexa: no mass, fullness, tenderness               Rectovaginal: Confirms               Anus:  normal sphincter tone, no lesions  Chaperone, Zenovia Jordan, CMA, was present for exam.  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  Pt aware.  States she will schedule pap smear not indicated Vaccines UTD Lab work done with Beverely Low and has follow up next week Currently planning colonoscopy at age 51 Return annually or prn

## 2019-07-12 ENCOUNTER — Other Ambulatory Visit: Payer: Self-pay

## 2019-07-12 ENCOUNTER — Ambulatory Visit (INDEPENDENT_AMBULATORY_CARE_PROVIDER_SITE_OTHER): Payer: 59 | Admitting: Family Medicine

## 2019-07-12 ENCOUNTER — Encounter: Payer: Self-pay | Admitting: Family Medicine

## 2019-07-12 ENCOUNTER — Encounter: Payer: Self-pay | Admitting: General Practice

## 2019-07-12 VITALS — BP 121/82 | HR 65 | Temp 97.6°F | Resp 16 | Ht 68.0 in | Wt 274.0 lb

## 2019-07-12 DIAGNOSIS — Z9884 Bariatric surgery status: Secondary | ICD-10-CM | POA: Diagnosis not present

## 2019-07-12 DIAGNOSIS — E119 Type 2 diabetes mellitus without complications: Secondary | ICD-10-CM | POA: Diagnosis not present

## 2019-07-12 DIAGNOSIS — I10 Essential (primary) hypertension: Secondary | ICD-10-CM | POA: Diagnosis not present

## 2019-07-12 DIAGNOSIS — E782 Mixed hyperlipidemia: Secondary | ICD-10-CM | POA: Diagnosis not present

## 2019-07-12 LAB — CBC WITH DIFFERENTIAL/PLATELET
Basophils Absolute: 0.1 10*3/uL (ref 0.0–0.1)
Basophils Relative: 1 % (ref 0.0–3.0)
Eosinophils Absolute: 0.4 10*3/uL (ref 0.0–0.7)
Eosinophils Relative: 5.5 % — ABNORMAL HIGH (ref 0.0–5.0)
HCT: 40 % (ref 36.0–46.0)
Hemoglobin: 13 g/dL (ref 12.0–15.0)
Lymphocytes Relative: 33.2 % (ref 12.0–46.0)
Lymphs Abs: 2.7 10*3/uL (ref 0.7–4.0)
MCHC: 32.6 g/dL (ref 30.0–36.0)
MCV: 90.5 fl (ref 78.0–100.0)
Monocytes Absolute: 0.6 10*3/uL (ref 0.1–1.0)
Monocytes Relative: 7 % (ref 3.0–12.0)
Neutro Abs: 4.3 10*3/uL (ref 1.4–7.7)
Neutrophils Relative %: 53.3 % (ref 43.0–77.0)
Platelets: 295 10*3/uL (ref 150.0–400.0)
RBC: 4.42 Mil/uL (ref 3.87–5.11)
RDW: 12.5 % (ref 11.5–15.5)
WBC: 8.1 10*3/uL (ref 4.0–10.5)

## 2019-07-12 LAB — BASIC METABOLIC PANEL
BUN: 13 mg/dL (ref 6–23)
CO2: 31 mEq/L (ref 19–32)
Calcium: 9.4 mg/dL (ref 8.4–10.5)
Chloride: 98 mEq/L (ref 96–112)
Creatinine, Ser: 0.57 mg/dL (ref 0.40–1.20)
GFR: 113.37 mL/min (ref >60.00)
Glucose, Bld: 92 mg/dL (ref 70–99)
Potassium: 4.9 mEq/L (ref 3.5–5.1)
Sodium: 136 mEq/L (ref 135–145)

## 2019-07-12 LAB — HEPATIC FUNCTION PANEL
ALT: 19 U/L (ref 0–35)
AST: 18 U/L (ref 0–37)
Albumin: 4.1 g/dL (ref 3.5–5.2)
Alkaline Phosphatase: 101 U/L (ref 39–117)
Bilirubin, Direct: 0.1 mg/dL (ref 0.0–0.3)
Total Bilirubin: 0.3 mg/dL (ref 0.2–1.2)
Total Protein: 6.6 g/dL (ref 6.0–8.3)

## 2019-07-12 LAB — LIPID PANEL
Cholesterol: 165 mg/dL (ref 0–200)
HDL: 59 mg/dL (ref >39.00)
LDL Cholesterol: 86 mg/dL (ref 0–99)
NonHDL: 106.16
Total CHOL/HDL Ratio: 3
Triglycerides: 102 mg/dL (ref 0.0–149.0)
VLDL: 20.4 mg/dL (ref 0.0–40.0)

## 2019-07-12 LAB — B12 AND FOLATE PANEL
Folate: >24.1 ng/mL (ref >5.9)
Vitamin B-12: 298 pg/mL (ref 211–911)

## 2019-07-12 LAB — HEMOGLOBIN A1C: Hgb A1c MFr Bld: 7.6 % — ABNORMAL HIGH (ref 4.6–6.5)

## 2019-07-12 LAB — TSH: TSH: 1.43 u[IU]/mL (ref 0.35–4.50)

## 2019-07-12 NOTE — Progress Notes (Signed)
   Subjective:    Patient ID: Dorota Heinrichs, female    DOB: 01/31/1971, 49 y.o.   MRN: [id number]  HPI HTN- chronic problem, well controlled today on HCTZ 12.5mg  daily, Lisinopril 5mg  daily.  No CP, SOB, HAs, edema.  Mild blurry vision- has eye exam next month.  Hyperlipidemia- chronic problem, on Lipitor 40mg  daily.  No abd pain, N/V.  Obesity- pt has gained 4 lbs since 2/5.  BMI is 41.66.  No regular exercise.  DM- chronic problem, following w/ Endo but overdue on A1C.  UTD on eye exam, on ACE for renal protection.  Due for foot exam.  No sores or open wounds on feet.  No numbness/tingling of hands/feet.   Review of Systems For ROS see HPI   This visit occurred during the SARS-CoV-2 public health emergency.  Safety protocols were in place, including screening questions prior to the visit, additional usage of staff PPE, and extensive cleaning of exam room while observing appropriate contact time as indicated for disinfecting solutions.       Objective:   Physical Exam Vitals reviewed.  Constitutional:      General: She is not in acute distress.    Appearance: She is well-developed. She is obese.  HENT:     Head: Normocephalic and atraumatic.  Eyes:     Conjunctiva/sclera: Conjunctivae normal.     Pupils: Pupils are equal, round, and reactive to light.  Neck:     Thyroid: No thyromegaly.  Cardiovascular:     Rate and Rhythm: Normal rate and regular rhythm.     Heart sounds: Normal heart sounds. No murmur.  Pulmonary:     Effort: Pulmonary effort is normal. No respiratory distress.     Breath sounds: Normal breath sounds.  Abdominal:     General: There is no distension.     Palpations: Abdomen is soft.     Tenderness: There is no abdominal tenderness.  Musculoskeletal:     Cervical back: Normal range of motion and neck supple.  Lymphadenopathy:     Cervical: No cervical adenopathy.  Skin:    General: Skin is warm and dry.  Neurological:     Mental Status: She is alert  and oriented to person, place, and time.  Psychiatric:        Behavior: Behavior normal.           Assessment & Plan:

## 2019-07-12 NOTE — Assessment & Plan Note (Signed)
Chronic problem.  Following w/ Endo but overdue for A1C.  Foot exam done today.  Will get labs and forward to Endo for review

## 2019-07-12 NOTE — Assessment & Plan Note (Signed)
Ongoing issue for pt.  Stressed need for healthy diet and regular exercise.  Will follow. 

## 2019-07-12 NOTE — Assessment & Plan Note (Signed)
Chronic problem.  Tolerating statin w/o difficulty.  Stressed need for healthy diet and regular exercise.  Check labs.  Adjust meds prn  

## 2019-07-12 NOTE — Patient Instructions (Signed)
Schedule your complete physical in 6 months We'll notify you of your lab results and make any changes if needed Continue to work on healthy diet and try and get regular exercise- you can do it! Call with any questions or concerns Stay Safe!  Stay Healthy!

## 2019-07-12 NOTE — Assessment & Plan Note (Signed)
Chronic problem.  Well controlled.  Asymptomatic.  Check labs.  No anticipated med changes.  Will follow. 

## 2019-07-12 NOTE — Assessment & Plan Note (Signed)
Check thiamine, B12, and folate at request of [facility].

## 2019-07-17 LAB — VITAMIN B1: Vitamin B1 (Thiamine): 17 nmol/L (ref 8–30)

## 2019-07-18 ENCOUNTER — Encounter: Payer: Self-pay | Admitting: General Practice

## 2019-07-26 ENCOUNTER — Other Ambulatory Visit: Payer: Self-pay | Admitting: Family Medicine

## 2019-07-26 DIAGNOSIS — F411 Generalized anxiety disorder: Secondary | ICD-10-CM | POA: Diagnosis not present

## 2019-07-26 DIAGNOSIS — F3132 Bipolar disorder, current episode depressed, moderate: Secondary | ICD-10-CM | POA: Diagnosis not present

## 2019-07-26 MED FILL — TEGRETOL XR 200 MG TABLET: 200 | 90 days supply | Qty: 270 | Fill #0

## 2019-07-26 MED FILL — TRULICITY 0.75 MG/0.5 ML PE: 0.75 | 28 days supply | Qty: 2 | Fill #5

## 2019-07-26 MED FILL — ARIPIPRAZOLE 10 MG TABS: 10 | 90 days supply | Qty: 90 | Fill #0

## 2019-07-26 MED FILL — metFORMIN HCL 500 MG TABS: 500 | 90 days supply | Qty: 360 | Fill #2

## 2019-07-26 MED FILL — lamoTRIgine 200 MG TABS: 200 | 90 days supply | Qty: 180 | Fill #1

## 2019-07-26 MED FILL — HYDROCHLOROTHIAZIDE 12.5 MG: 12.5 | 90 days supply | Qty: 90 | Fill #0

## 2019-08-02 DIAGNOSIS — H5213 Myopia, bilateral: Secondary | ICD-10-CM | POA: Diagnosis not present

## 2019-08-02 LAB — HM DIABETES EYE EXAM

## 2019-08-05 ENCOUNTER — Other Ambulatory Visit: Payer: Self-pay | Admitting: Family Medicine

## 2019-08-05 ENCOUNTER — Encounter: Payer: Self-pay | Admitting: General Practice

## 2019-08-05 MED FILL — tiaGABine HCL 4 MG TABS: 4 | 90 days supply | Qty: 360 | Fill #1

## 2019-08-05 MED FILL — LISINOPRIL 5 MG TABLET: 5 | 90 days supply | Qty: 90 | Fill #0

## 2019-08-09 ENCOUNTER — Other Ambulatory Visit: Payer: Self-pay

## 2019-08-11 ENCOUNTER — Ambulatory Visit: Payer: 59 | Admitting: Internal Medicine

## 2019-08-11 ENCOUNTER — Encounter: Payer: Self-pay | Admitting: Internal Medicine

## 2019-08-11 ENCOUNTER — Other Ambulatory Visit: Payer: Self-pay

## 2019-08-11 VITALS — BP 120/70 | HR 76 | Ht 68.0 in | Wt 277.0 lb

## 2019-08-11 DIAGNOSIS — E782 Mixed hyperlipidemia: Secondary | ICD-10-CM

## 2019-08-11 DIAGNOSIS — E119 Type 2 diabetes mellitus without complications: Secondary | ICD-10-CM | POA: Diagnosis not present

## 2019-08-11 NOTE — Patient Instructions (Addendum)
Please continue: - Metformin 1000 mg 2x a day - Trulicity 0.75 mg weekly  Please change the snacks as discussed.  Check out the article: The Calorie Density Approach to Nutrition and Lifelong Weight Management By Caren Macadam, MS, RD November 12 2010  Please come back for a follow-up appointment in 4 months.

## 2019-08-11 NOTE — Progress Notes (Signed)
Patient ID: [name], female   DOB: [birth year], 49 y.o.   MRN: [id number]  This visit occurred during the SARS-CoV-2 public health emergency.  Safety protocols were in place, including screening questions prior to the visit, additional usage of staff PPE, and extensive cleaning of exam room while observing appropriate contact time as indicated for disinfecting solutions.   HPI: [name] is a 49 y.o.-year-old female, presenting for f/u for DM2, dx in 2001, insulin-dependent 2007, uncontrolled, without long term complications. Last visit 4 months ago (virtual).  She had Roux-en-Y bypass surgery in 03/2016.  After this, she lost more than 100 pounds but afterwards gained approximately 40 pounds back.    She restarted candy since last visit and noticed that her sugars and her weight increased. Now stopped after last HbA1c returned elevated.  Reviewed HbA1c levels: Lab Results  Component Value Date   HGBA1C 7.6 (H) 07/12/2019   HGBA1C 6.7 (A) 11/30/2018   HGBA1C 8.5 (A) 07/26/2018   HGBA1C 9.9 (H) 04/15/2018   HGBA1C 7.6 08/03/2017   HGBA1C 6.6 (H) 12/30/2016   HGBA1C 6.1 (H) 10/09/2016   HGBA1C 9.6 02/21/2016   HGBA1C 9.2 10/05/2015   HGBA1C 10.4 05/31/2015   HGBA1C 8.8 01/09/2015   HGBA1C 11.0 (H) 06/21/2014   HGBA1C 8.8 (H) 01/03/2014   HGBA1C 9.6 (H) 04/12/2013   HGBA1C 11.0 (H) 11/18/2012   HGBA1C 9.0 (H) 11/12/2011   HGBA1C 9.6 (H) 06/23/2011   Pt was on a regimen of: - Metformin 2000 mg at dinnertime - Lantus 63 units at bedtime - Victoza 1.8 mg daily - Amaryl 4 mg 2x a day She had frequent yeast inf when sugars were higher before.   Pt is now on a regimen of: - Metformin 500 mg 2x a day >> 1000 mg 2x a day -increased in 03/2018 - Trulicity 0.75 mg weekly-added 07/2018-she denies GI side effects  Pt checks sugars once a day: - am:  115-135 >> 114-130, 140 >> 105-140 >> 110-130 - 2h after b'fast: 145-176 >> n/c - before lunch: 82-167 >> 120s >> n/c >> 110-120 >>  n/c - 2h after lunch:  106-166 >> n/c - before dinner: n/c >> 86-110 >> n/c >> 120-150 (yoghurt) - 2h after dinner: 83-101 >> 100-120 >> n/c - bedtime:121, 132 >> 130-160 >> 130-155 >> (candy or other sweets): 200-225, OTW: 120-150 - nighttime: n/c Lowest sugar was 34 (after Sx - while still on insulin) >> .[name].114 >> 105 >> 110 ; she has hypoglycemia awareness in the 90s. Highest sugar was 330 >> 160 >> 155 >> 225.  Glucometer: One Touch Ultra 2    -No CKD, last BUN/creatinine:  Lab Results  Component Value Date   BUN 13 07/12/2019   CREATININE 0.57 07/12/2019  On lisinopril.  She had to decrease the dose due to low blood pressures. -+ HL; last set of lipids: Lab Results  Component Value Date   CHOL 165 07/12/2019   HDL 59.00 07/12/2019   LDLCALC 86 07/12/2019   LDLDIRECT 112.0 06/21/2014   TRIG 102.0 07/12/2019   CHOLHDL 3 07/12/2019  On Lipitor 40. - last eye exam was 08/02/2019: No DR.+ cataracts. - will have Sx Dr Emily Filbert.  - nonumbness and tingling in her feet.   On HCTZ for HTN.  ROS: Constitutional: +weight gain/no weight loss, no fatigue, no subjective hyperthermia, no subjective hypothermia Eyes: no blurry vision, no xerophthalmia ENT: no sore throat, no nodules palpated in neck, no dysphagia, no odynophagia, no hoarseness Cardiovascular: no  CP/no SOB/no palpitations/no leg swelling Respiratory: no cough/no SOB/no wheezing Gastrointestinal: no N/no V/no D/no C/no acid reflux Musculoskeletal: no muscle aches/no joint aches Skin: no rashes, no hair loss Neurological: no tremors/no numbness/no tingling/no dizziness  I reviewed pt's medications, allergies, PMH, social hx, family hx, and changes were documented in the history of present illness. Otherwise, unchanged from my initial visit note.  Past Medical History:  Diagnosis Date  . Allergy   . Asthma    pt stated treated as ashtma but not really asthma  . Bipolar disorder ([facility])   . Chicken pox   . Complication  of anesthesia    oxygen saturation dropped after hysterectomy 2009 at [facility]  . Depression   . Diabetes mellitus   . Dyspnea   . GERD (gastroesophageal reflux disease)   . Hyperlipidemia   . Hypertension    readings  . Hypothyroidism   . Irregular heartbeat    saw dr Myrtis Ser sees cardiology as needed  . Migraine   . Sleep apnea    uses cpap   Past Surgical History:  Procedure Laterality Date  . ANKLE SURGERY  1989   left  . BACK SURGERY  1999  . CESAREAN SECTION     1995  . LAPAROSCOPIC ASSISTED VAGINAL HYSTERECTOMY  2009     BSO fibroids, DUB, pelvic pain  . 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 SHOULDER ARTHROSCOPY WITH OPEN ROTATOR CUFF REPAIR Right 01/02/2017   Procedure: Right shoulder arthroscopy, A-subcromial decompression, mini open rotator cuff repair, open distal clavicle resection, biceps tenodesis;  Surgeon: Beverely Low, MD;  Location: [facility];  Service: Orthopedics;  Laterality: Right;  . THYROIDECTOMY, PARTIAL  2001   removed left  . UMBILICAL HERNIA REPAIR  2003  . WISDOM TOOTH EXTRACTION     Social History   Social History  . Marital Status: Married    Spouse Name: N/A  . Number of Children: 1   Occupational History  . Registrar, Administrator   Social History Main Topics  . Smoking status: Never Smoker   . Smokeless tobacco: Never Used  . Alcohol Use: No  . Drug Use: No   Current Outpatient Medications on File Prior to Visit  Medication Sig Dispense Refill  . ARIPiprazole (ABILIFY) 10 MG tablet Take 10 mg by mouth daily.   1  . atorvastatin (LIPITOR) 40 MG tablet TAKE 1 TABLET BY MOUTH DAILY 90 tablet 1  . Biotin 35573 MCG TABS Take 1 tablet by mouth daily.    . Blood Glucose Monitoring Suppl (FREESTYLE LITE) DEVI Use to check sugar 2 times  daily 1 each 0  . Calcium Carbonate-Vit D-Min (CALTRATE 600+D PLUS MINIS PO) Take 1 tablet by mouth 3 (three) times daily.    . carbamazepine (TEGRETOL XR) 200 MG 12 hr tablet 1 tablet in the morning, 2 at night    . clobetasol ointment (TEMOVATE) 0.05 % APPLY 1 APPLICATION TOPICALLY 2 TIMES DAILY. DO NOT USE FOR MORE THAN 7 DAYS 60 g 1  . gabapentin (NEURONTIN) 600 MG tablet Take 600 mg by mouth 3 (three) times daily.   1  . glucose blood (FREESTYLE LITE) test strip Use as instructed to check sugar 2 times daily 200 each 5  . hydrochlorothiazide (MICROZIDE) 12.5 MG capsule TAKE 1 CAPSULE BY MOUTH ONCE DAILY  90 capsule 0  . ipratropium (ATROVENT) 0.03 % nasal spray Place 2 sprays into both nostrils every 12 (twelve) hours. 30 mL 12  . lamoTRIgine (LAMICTAL) 200 MG tablet Take 200 mg by mouth 2 (two) times daily.    . Lancets (FREESTYLE) lancets Use as instructed to check sugar 2 times daily 200 each 5  . lisinopril (ZESTRIL) 5 MG tablet TAKE 1 TABLET BY MOUTH ONCE DAILY 90 tablet 1  . metFORMIN (GLUCOPHAGE) 500 MG tablet TAKE 2 TABLETS BY MOUTH TWICE A DAY WITH A MEAL 360 tablet 2  . Multiple Vitamin (MULTIVITAMIN) tablet Take 1 tablet by mouth daily.    . tiaGABine (GABITRIL) 4 MG tablet Take 8 mg by mouth 2 (two) times daily.    . TRULICITY 0.26 VZ/8.5YI SOPN INJECT 0.75 MG IN AM WEEKLY UNDER SKIN 2 mL 5   No current facility-administered medications on file prior to visit.   Allergies  Allergen Reactions  . Zofran [Ondansetron Hcl] Other (See Comments)    Severe headache   Family History  Problem Relation Age of Onset  . Hyperlipidemia Mother   . Diabetes Mother   . Anxiety disorder Mother   . Heart disease Mother   . Hypertension Father   . Lupus Father   . Heart disease Father   . Stroke Maternal Aunt   . Stroke Paternal Grandfather   . Mental illness Paternal Grandfather   . Breast cancer Neg Hx    PE: BP 120/70   Pulse 76   Ht 5\' 8"  (1.727 m)   Wt 277 lb (125.6 kg)    LMP 01/25/2008   SpO2 95%   BMI 42.12 kg/m  Wt Readings from Last 3 Encounters:  08/11/19 277 lb (125.6 kg)  07/12/19 274 lb (124.3 kg)  07/01/19 270 lb (122.5 kg)   Constitutional: overweight, in NAD Eyes: PERRLA, EOMI, no exophthalmos ENT: moist mucous membranes, no thyromegaly, no cervical lymphadenopathy Cardiovascular: RRR, No MRG Respiratory: CTA B Gastrointestinal: abdomen soft, NT, ND, BS+ Musculoskeletal: no deformities, strength intact in all 4 Skin: moist, warm, no rashes Neurological: no tremor with outstretched hands, DTR normal in all 4  ASSESSMENT: 1. DM2, insulin-dependent, uncontrolled, without long-term complications  2. Obesity  3. HL  PLAN:  1. Patient with longstanding, previously uncontrolled diabetes, on a complex antidiabetic regimen, then off medications except Metformin after her Roux-en-Y gastric surgery in 2017 and losing more than 100 pounds.  She did start to gain weight afterwards and HbA1c increased to 9.9%.  At that time, we increased her Metformin and then added Trulicity.  At that time, sugars were stable, mostly at goal.  In the morning she occasionally had sugars in the 140s clinic due to eating yogurt at night.  We discussed about stopping yogurt or moving it earlier in the day.  At last visit, HbA1c was excellent, at 6.7%, however, she has been recent HbA1c obtained a month ago which is higher, at 7.6%. -At this visit, sugars are higher later in the day, after she started to eat candy at night.  However, after her latest HbA1c was done, she cut out candy most of the time.  She is trying to eliminate these completely.  Discussed about healthier snacks.  We also discussed about calorie density and I advised her to buy food with lower calorie density. -We also discussed about maybe eliminate since this is raising her blood sugars before dinner -She would like to continue with the low-dose Trulicity for now and continue  to work on her diet. - I  advised her to:  Patient Instructions  Please continue: - Metformin 1000 mg 2x a day - Trulicity 0.75 mg weekly  Please change the snacks as discussed.  Check out the article: The Calorie Density Approach to Nutrition and Lifelong Weight Management By Caren Macadam, MS, RD November 12 2010  Please come back for a follow-up appointment in 4 months.  - advised to check sugars at different times of the day - 1x a day, rotating check times - advised for yearly eye exams >> she is UTD - return to clinic in 4 months     2. Obesity class III -She had Roux-en-Y gastric bypass surgery after which she lost a significant amount of weight. -We also have her on a GLP-1 receptor agonist, which should also help with weight loss -She gained 14 pounds since last visit (263 lbs in 03/2019 >> 277 now) - discussed about improving her diet and given specific examples about how to do so.  3. HL -Reviewed latest lipid panel from 06/2019: Fractions at goal Lab Results  Component Value Date   CHOL 165 07/12/2019   HDL 59.00 07/12/2019   LDLCALC 86 07/12/2019   LDLDIRECT 112.0 06/21/2014   TRIG 102.0 07/12/2019   CHOLHDL 3 07/12/2019  -Continues Lipitor 40, without side effects  Carlus Pavlov, MD PhD [facility] Endocrinology

## 2019-09-06 ENCOUNTER — Other Ambulatory Visit: Payer: Self-pay | Admitting: Family Medicine

## 2019-09-06 DIAGNOSIS — Z1231 Encounter for screening mammogram for malignant neoplasm of breast: Secondary | ICD-10-CM

## 2019-09-21 DIAGNOSIS — H25043 Posterior subcapsular polar age-related cataract, bilateral: Secondary | ICD-10-CM | POA: Diagnosis not present

## 2019-09-21 DIAGNOSIS — E113293 Type 2 diabetes mellitus with mild nonproliferative diabetic retinopathy without macular edema, bilateral: Secondary | ICD-10-CM | POA: Diagnosis not present

## 2019-09-29 ENCOUNTER — Other Ambulatory Visit: Payer: Self-pay

## 2019-09-29 ENCOUNTER — Other Ambulatory Visit: Payer: Self-pay | Admitting: Internal Medicine

## 2019-09-29 ENCOUNTER — Other Ambulatory Visit: Payer: Self-pay | Admitting: Family Medicine

## 2019-09-29 ENCOUNTER — Ambulatory Visit: Payer: 59

## 2019-09-29 ENCOUNTER — Ambulatory Visit
Admission: RE | Admit: 2019-09-29 | Discharge: 2019-09-29 | Disposition: A | Discharge status: Home / Self Care | Payer: 59 | Source: Ambulatory Visit | Attending: Family Medicine | Admitting: Family Medicine

## 2019-09-29 DIAGNOSIS — Z1231 Encounter for screening mammogram for malignant neoplasm of breast: Secondary | ICD-10-CM | POA: Diagnosis not present

## 2019-09-29 MED FILL — ATORVASTATIN CALCIUM 40 MG: 40 | 90 days supply | Qty: 90 | Fill #0

## 2019-09-29 MED FILL — TRULICITY 0.75 MG/0.5 ML PE: 0.75 | 28 days supply | Qty: 2 | Fill #0

## 2019-10-14 MED FILL — GABAPENTIN 600 MG TABLET: 600 | 90 days supply | Qty: 270 | Fill #0

## 2019-10-20 DIAGNOSIS — H25812 Combined forms of age-related cataract, left eye: Secondary | ICD-10-CM | POA: Diagnosis not present

## 2019-10-20 DIAGNOSIS — H25042 Posterior subcapsular polar age-related cataract, left eye: Secondary | ICD-10-CM | POA: Diagnosis not present

## 2019-10-20 DIAGNOSIS — H268 Other specified cataract: Secondary | ICD-10-CM | POA: Diagnosis not present

## 2019-10-21 MED FILL — ARIPIPRAZOLE 10 MG TABS: 10 | 90 days supply | Qty: 90 | Fill #0

## 2019-10-21 MED FILL — lamoTRIgine 200 MG TABS: 200 | 90 days supply | Qty: 180 | Fill #0

## 2019-10-21 MED FILL — TEGRETOL XR 200 MG TABLET: 200 | 90 days supply | Qty: 270 | Fill #0

## 2019-10-26 DIAGNOSIS — G4733 Obstructive sleep apnea (adult) (pediatric): Secondary | ICD-10-CM | POA: Diagnosis not present

## 2019-10-27 DIAGNOSIS — H2511 Age-related nuclear cataract, right eye: Secondary | ICD-10-CM | POA: Diagnosis not present

## 2019-10-27 DIAGNOSIS — H25041 Posterior subcapsular polar age-related cataract, right eye: Secondary | ICD-10-CM | POA: Diagnosis not present

## 2019-10-27 DIAGNOSIS — H268 Other specified cataract: Secondary | ICD-10-CM | POA: Diagnosis not present

## 2019-11-03 ENCOUNTER — Other Ambulatory Visit: Payer: Self-pay | Admitting: Family Medicine

## 2019-11-25 ENCOUNTER — Other Ambulatory Visit: Payer: Self-pay | Admitting: Internal Medicine

## 2019-11-25 MED FILL — METFORMIN HCL 500 MG TABS: 500 | 90 days supply | Qty: 360 | Fill #0

## 2019-11-29 ENCOUNTER — Other Ambulatory Visit (HOSPITAL_BASED_OUTPATIENT_CLINIC_OR_DEPARTMENT_OTHER): Payer: Self-pay | Admitting: Nurse Practitioner

## 2019-11-29 DIAGNOSIS — F411 Generalized anxiety disorder: Secondary | ICD-10-CM | POA: Diagnosis not present

## 2019-11-29 DIAGNOSIS — F3132 Bipolar disorder, current episode depressed, moderate: Secondary | ICD-10-CM | POA: Diagnosis not present

## 2019-12-15 ENCOUNTER — Other Ambulatory Visit: Payer: Self-pay

## 2019-12-15 ENCOUNTER — Encounter: Payer: Self-pay | Admitting: Internal Medicine

## 2019-12-15 ENCOUNTER — Other Ambulatory Visit: Payer: Self-pay | Admitting: Internal Medicine

## 2019-12-15 ENCOUNTER — Ambulatory Visit: Payer: 59 | Admitting: Internal Medicine

## 2019-12-15 VITALS — BP 128/70 | HR 77 | Ht 68.0 in | Wt 284.0 lb

## 2019-12-15 DIAGNOSIS — E119 Type 2 diabetes mellitus without complications: Secondary | ICD-10-CM

## 2019-12-15 DIAGNOSIS — E782 Mixed hyperlipidemia: Secondary | ICD-10-CM | POA: Diagnosis not present

## 2019-12-15 LAB — POCT GLYCOSYLATED HEMOGLOBIN (HGB A1C): Hemoglobin A1C: 8.1 % — AB (ref 4.0–5.6)

## 2019-12-15 MED ORDER — FREESTYLE LITE DEVI: 0 refills | Status: DC

## 2019-12-15 MED ORDER — FREESTYLE LANCETS MISC: 5 refills | Status: DC

## 2019-12-15 MED ORDER — FREESTYLE LITE TEST VI STRP: ORAL_STRIP | 5 refills | Status: DC

## 2019-12-15 MED FILL — FREESTYLE FREEDOM LITE METE: W/DEVICE | 30 days supply | Qty: 1 | Fill #0

## 2019-12-15 MED FILL — FREESTYLE LITE TEST STRIP: 50 days supply | Qty: 100 | Fill #0

## 2019-12-15 MED FILL — FREESTYLE LANCETS: 50 days supply | Qty: 100 | Fill #0

## 2019-12-15 NOTE — Progress Notes (Signed)
Patient ID: [name], female   DOB: [birth year], 49 y.o.   MRN: [id number]  This visit occurred during the SARS-CoV-2 public health emergency.  Safety protocols were in place, including screening questions prior to the visit, additional usage of staff PPE, and extensive cleaning of exam room while observing appropriate contact time as indicated for disinfecting solutions.   HPI: [name] is a 49 y.o.-year-old female, presenting for f/u for DM2, dx in 2001, insulin-dependent 2007, uncontrolled, without long term complications. Last visit 4 months ago.  She had Roux-en-Y bypass surgery in 03/2016.  After this, she lost more than 100 pounds but she gained approximately 50 pounds back.  Before last visit, she restarted candy and sugars and weight increased.  Since last visit, she her sugars improved and she stopped her Ozempic.  She then went to vacation and started to have more dietary indiscretions and she also lost her meter and could not check.  She started to feel poorly and when she actually checked her sugars, they were in the 200s.  She started Ozempic back 2 weeks ago.  She also cut out sugars for now.  Reviewed HbA1c levels: Lab Results  Component Value Date   HGBA1C 7.6 (H) 07/12/2019   HGBA1C 6.7 (A) 11/30/2018   HGBA1C 8.5 (A) 07/26/2018   HGBA1C 9.9 (H) 04/15/2018   HGBA1C 7.6 08/03/2017   HGBA1C 6.6 (H) 12/30/2016   HGBA1C 6.1 (H) 10/09/2016   HGBA1C 9.6 02/21/2016   HGBA1C 9.2 10/05/2015   HGBA1C 10.4 05/31/2015   HGBA1C 8.8 01/09/2015   HGBA1C 11.0 (H) 06/21/2014   HGBA1C 8.8 (H) 01/03/2014   HGBA1C 9.6 (H) 04/12/2013   HGBA1C 11.0 (H) 11/18/2012   HGBA1C 9.0 (H) 11/12/2011   HGBA1C 9.6 (H) 06/23/2011   Pt was on a regimen of: - Metformin 2000 mg at dinnertime - Lantus 63 units at bedtime - Victoza 1.8 mg daily - Amaryl 4 mg 2x a day She had frequent yeast inf when sugars were higher before.   Pt is now on a regimen of: - Metformin 500 mg 2x a day  >> 1000 mg 2x a day -increased in 03/2018 - Trulicity 0.75 mg weekly-added 07/2018 >> stopped this for 2 mo and not checking sugars!!! - restarted 2 weeks ago  Pt was checking sugars once a day - stopped checking as she lost her meter... From last visit: - am:  115-135 >> 114-130, 140 >> 105-140 >> 110-130 - 2h after b'fast: 145-176 >> n/c - before lunch: 82-167 >> 120s >> n/c >> 110-120 >> n/c - 2h after lunch:  106-166 >> n/c - before dinner: n/c >> 86-110 >> n/c >> 120-150 (yoghurt) - 2h after dinner: 83-101 >> 100-120 >> n/c - bedtime: 130-160 >> 130-155 >> (candy) 200-225, OTW: 120-150 - nighttime: n/c Lowest sugar was 34 (after Sx - while still on insulin) >> ...110 >> ? ; she has hypoglycemia awareness in the 90s. Highest sugar was 330 >> ... 225 >> 225.  Glucometer: One Touch Ultra 2 >> FreeStyle Lite   -No CKD, last BUN/creatinine:  Lab Results  Component Value Date   BUN 13 07/12/2019   CREATININE 0.57 07/12/2019  On lisinopril. -+ HL; last set of lipids: Lab Results  Component Value Date   CHOL 165 07/12/2019   HDL 59.00 07/12/2019   LDLCALC 86 07/12/2019   LDLDIRECT 112.0 06/21/2014   TRIG 102.0 07/12/2019   CHOLHDL 3 07/12/2019  On Lipitor 40. - last eye exam  was in 07/2019: No DR, + cataracts >> had cataract surgery and now her vision is 20/20.  Dr. Emily FilbertGould.  -  No numbness and tingling in her feet.   On HCTZ for HTN.  ROS: Constitutional: + weight gain/no weight loss, no fatigue, no subjective hyperthermia, no subjective hypothermia Eyes: no blurry vision, no xerophthalmia ENT: no sore throat, no nodules palpated in neck, no dysphagia, no odynophagia, no hoarseness Cardiovascular: no CP/no SOB/no palpitations/no leg swelling Respiratory: no cough/no SOB/no wheezing Gastrointestinal: no N/no V/no D/no C/no acid reflux Musculoskeletal: no muscle aches/no joint aches Skin: no rashes, no hair loss Neurological: no tremors/no numbness/no tingling/no  dizziness  I reviewed pt's medications, allergies, PMH, social hx, family hx, and changes were documented in the history of present illness. Otherwise, unchanged from my initial visit note.  Past Medical History:  Diagnosis Date  . Allergy   . Asthma    pt stated treated as ashtma but not really asthma  . Bipolar disorder (HCC)   . Chicken pox   . Complication of anesthesia    [facility] saturation dropped [facility]  . Depression   . Diabetes mellitus   . Dyspnea   . GERD (gastroesophageal reflux disease)   . Hyperlipidemia   . Hypertension    readings  . Hypothyroidism   . Irregular heartbeat    saw dr Myrtis SerKatz sees cardiology as needed  . Migraine   . Sleep apnea    uses cpap   Past Surgical History:  Procedure Laterality Date  . ANKLE SURGERY  1989   left  . BACK SURGERY  1999  . CESAREAN SECTION     1995  . LAPAROSCOPIC ASSISTED VAGINAL HYSTERECTOMY  2009     BSO fibroids, DUB, pelvic pain  . 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 SHOULDER ARTHROSCOPY WITH OPEN ROTATOR CUFF REPAIR Right 01/02/2017   Procedure: Right shoulder arthroscopy, A-subcromial decompression, mini open rotator cuff repair, open distal clavicle resection, biceps tenodesis;  Surgeon: Beverely LowNorris, Steve, MD;  [facility];  Service: Orthopedics;  Laterality: Right;  . THYROIDECTOMY, PARTIAL  2001   removed left  . UMBILICAL HERNIA REPAIR  2003  . WISDOM TOOTH EXTRACTION     Social History   Social History  . Marital Status: Married    Spouse Name: N/A  . Number of Children: 1   Occupational History  . Registrar, [employer]   Social History Main Topics  . Smoking status: Never Smoker   . Smokeless tobacco: Never Used  . Alcohol Use: No  . Drug Use: No   Current  Outpatient Medications on File Prior to Visit  Medication Sig Dispense Refill  . ARIPiprazole (ABILIFY) 10 MG tablet Take 10 mg by mouth daily.   1  . atorvastatin (LIPITOR) 40 MG tablet TAKE 1 TABLET BY MOUTH DAILY 90 tablet 1  . Biotin 1610910000 MCG TABS Take 1 tablet by mouth daily.    . Blood Glucose Monitoring Suppl (FREESTYLE LITE) DEVI Use to check sugar 2 times daily 1 each 0  . Calcium Carbonate-Vit D-Min (CALTRATE 600+D PLUS MINIS PO) Take 1 tablet by mouth 3 (three) times daily.    . carbamazepine (TEGRETOL XR) 200 MG 12 hr tablet 1 tablet in the morning, 2 at night    . clobetasol  ointment (TEMOVATE) 0.05 % APPLY 1 APPLICATION TOPICALLY 2 TIMES DAILY. DO NOT USE FOR MORE THAN 7 DAYS 60 g 1  . gabapentin (NEURONTIN) 600 MG tablet Take 600 mg by mouth 3 (three) times daily.   1  . glucose blood (FREESTYLE LITE) test strip Use as instructed to check sugar 2 times daily 200 each 5  . hydrochlorothiazide (MICROZIDE) 12.5 MG capsule TAKE 1 CAPSULE BY MOUTH ONCE DAILY 90 capsule 0  . ipratropium (ATROVENT) 0.03 % nasal spray Place 2 sprays into both nostrils every 12 (twelve) hours. 30 mL 12  . lamoTRIgine (LAMICTAL) 200 MG tablet Take 200 mg by mouth 2 (two) times daily.    . Lancets (FREESTYLE) lancets Use as instructed to check sugar 2 times daily 200 each 5  . lisinopril (ZESTRIL) 5 MG tablet TAKE 1 TABLET BY MOUTH ONCE DAILY 90 tablet 1  . metFORMIN (GLUCOPHAGE) 500 MG tablet TAKE 2 TABLETS BY MOUTH TWICE A DAY WITH A MEAL 360 tablet 2  . Multiple Vitamin (MULTIVITAMIN) tablet Take 1 tablet by mouth daily.    . tiaGABine (GABITRIL) 4 MG tablet Take 8 mg by mouth 2 (two) times daily.    . TRULICITY 0.75 MG/0.5ML SOPN INJECT 0.75 MG UNDER THE SKIN ONCE EVERY MORNING 2 mL 5   No current facility-administered medications on file prior to visit.   Allergies  Allergen Reactions  . Zofran [Ondansetron Hcl] Other (See Comments)    Severe headache   Family History  Problem Relation Age of  Onset  . Hyperlipidemia Mother   . Diabetes Mother   . Anxiety disorder Mother   . Heart disease Mother   . Hypertension Father   . Lupus Father   . Heart disease Father   . Stroke Maternal Aunt   . Stroke Paternal Grandfather   . Mental illness Paternal Grandfather   . Breast cancer Neg Hx    PE: BP 128/70   Pulse 77   Ht 5\' 8"  (1.727 m)   Wt (!) 284 lb (128.8 kg)   LMP 01/25/2008   SpO2 96%   BMI 43.18 kg/m  Wt Readings from Last 3 Encounters:  12/15/19 (!) 284 lb (128.8 kg)  08/11/19 277 lb (125.6 kg)  07/12/19 274 lb (124.3 kg)   Constitutional: overweight, in NAD Eyes: PERRLA, EOMI, no exophthalmos ENT: moist mucous membranes, no thyromegaly, no cervical lymphadenopathy Cardiovascular: RRR, No MRG Respiratory: CTA B Gastrointestinal: abdomen soft, NT, ND, BS+ Musculoskeletal: no deformities, strength intact in all 4 Skin: moist, warm, no rashes Neurological: no tremor with outstretched hands, DTR normal in all 4  ASSESSMENT: 1. DM2, insulin-dependent, uncontrolled, without long-term complications  2. Obesity class III  3. HL  PLAN:  1. Patient with longstanding, previously uncontrolled diabetes, on a complex antidiabetic regimen initially been off all medications except Metformin after her Roux-en-Y gastric surgery 2017 and losing more than 100 pounds.  She did start to gain weight afterwards and HbA1c increased to 9.9%.  At that time, we increased Metformin dose and added Trulicity.  HbA1c improved to 6.7%, however, at last visit itincreased back to 7.6%. -At last visit, we discussed about eliminating candy as these were raising her blood sugars before dinner.  I also gave her ideas about healthier snacks.  We discussed about increasing Trulicity dose but she wanted to stay on the lower dose at that time.  We also discussed about foods with lower calorie density. -At this visit, she tells me that since last visit,  she stopped Ozempic and also stopped checking  sugars as she lost her meter.  She noticed she started to feel poorly, craving sweets, and also gaining weight.  When she finally was able to check her blood sugars they were in the 200s.  She needs a glucometer to be sent to her pharmacy, since she lost her old one.  We discussed about the importance of checking sugars consistently and I also gave her a sugar log and advised her how to feel it.  I sent a prescription for meter to her pharmacy along with supplies.  I also strongly advised her to continue the Trulicity for now.  If her sugars do not improve, we may need to increase the dose to 1.5 mg weekly. - I advised her to:  Patient Instructions  Please continue: - Metformin 1000 mg 2x a day - Trulicity 0.75 mg weekly  Start checking sugars 1-2x a day.  Please come back for a follow-up appointment in 4 months.  - we checked her HbA1c: 8.1% (higher) - advised to check sugars at different times of the day, rotating check times - advised for yearly eye exams >> she is UTD - return to clinic in 4 months     2. Obesity class III -She had Roux-en-Y gastric bypass surgery after which she lost a significant amount of weight.  However, she started to gain weight afterwards. -At last visit, we discussed about the calorie density of foods and how to stay with lower calorie density foods.  Given references. -She was off the GLP-1 receptor agonist but she restarted 2 weeks ago.  I explained that this should also help with weight loss so we will continue it. -She gained 14 pounds before last visit and 7 pounds since last visit  3. HL -Reviewed latest lipid panel from 06/2019: All fractions at goal: Lab Results  Component Value Date   CHOL 165 07/12/2019   HDL 59.00 07/12/2019   LDLCALC 86 07/12/2019   LDLDIRECT 112.0 06/21/2014   TRIG 102.0 07/12/2019   CHOLHDL 3 07/12/2019  -Continues Lipitor 40 without side effects  Carlus Pavlov, MD PhD [facility]

## 2019-12-15 NOTE — Patient Instructions (Addendum)
Please continue: - Metformin 1000 mg 2x a day - Trulicity 0.75 mg weekly  Start checking sugars 1-2x a day.  Please come back for a follow-up appointment in 4 months.

## 2019-12-20 DIAGNOSIS — M222X1 Patellofemoral disorders, right knee: Secondary | ICD-10-CM | POA: Diagnosis not present

## 2019-12-20 DIAGNOSIS — M25561 Pain in right knee: Secondary | ICD-10-CM | POA: Diagnosis not present

## 2019-12-27 MED FILL — ATORVASTATIN CALCIUM 40 MG: 40 | 90 days supply | Qty: 90 | Fill #1

## 2019-12-27 MED FILL — TRULICITY 0.75 MG/0.5 ML PE: 0.75 | 28 days supply | Qty: 2 | Fill #1

## 2020-01-13 ENCOUNTER — Encounter: Payer: Self-pay | Admitting: Family Medicine

## 2020-01-13 ENCOUNTER — Other Ambulatory Visit: Payer: Self-pay

## 2020-01-13 ENCOUNTER — Ambulatory Visit (INDEPENDENT_AMBULATORY_CARE_PROVIDER_SITE_OTHER): Payer: 59 | Admitting: Family Medicine

## 2020-01-13 VITALS — BP 124/68 | HR 64 | Temp 98.7°F | Ht 68.5 in | Wt 268.0 lb

## 2020-01-13 DIAGNOSIS — Z1159 Encounter for screening for other viral diseases: Secondary | ICD-10-CM

## 2020-01-13 DIAGNOSIS — Z1211 Encounter for screening for malignant neoplasm of colon: Secondary | ICD-10-CM | POA: Diagnosis not present

## 2020-01-13 DIAGNOSIS — R002 Palpitations: Secondary | ICD-10-CM | POA: Diagnosis not present

## 2020-01-13 DIAGNOSIS — Z9189 Other specified personal risk factors, not elsewhere classified: Secondary | ICD-10-CM | POA: Diagnosis not present

## 2020-01-13 DIAGNOSIS — Z0000 Encounter for general adult medical examination without abnormal findings: Secondary | ICD-10-CM

## 2020-01-13 LAB — BASIC METABOLIC PANEL
BUN: 11 mg/dL (ref 6–23)
CO2: 31 mEq/L (ref 19–32)
Calcium: 9.4 mg/dL (ref 8.4–10.5)
Chloride: 95 mEq/L — ABNORMAL LOW (ref 96–112)
Creatinine, Ser: 0.56 mg/dL (ref 0.40–1.20)
GFR: 115.46 mL/min (ref >60.00)
Glucose, Bld: 105 mg/dL — ABNORMAL HIGH (ref 70–99)
Potassium: 5.1 mEq/L (ref 3.5–5.1)
Sodium: 133 mEq/L — ABNORMAL LOW (ref 135–145)

## 2020-01-13 LAB — CBC WITH DIFFERENTIAL/PLATELET
Basophils Absolute: 0.1 10*3/uL (ref 0.0–0.1)
Basophils Relative: 1.2 % (ref 0.0–3.0)
Eosinophils Absolute: 0.4 10*3/uL (ref 0.0–0.7)
Eosinophils Relative: 4.4 % (ref 0.0–5.0)
HCT: 38.9 % (ref 36.0–46.0)
Hemoglobin: 13 g/dL (ref 12.0–15.0)
Lymphocytes Relative: 27.8 % (ref 12.0–46.0)
Lymphs Abs: 2.3 10*3/uL (ref 0.7–4.0)
MCHC: 33.3 g/dL (ref 30.0–36.0)
MCV: 89.2 fl (ref 78.0–100.0)
Monocytes Absolute: 0.7 10*3/uL (ref 0.1–1.0)
Monocytes Relative: 8.6 % (ref 3.0–12.0)
Neutro Abs: 4.7 10*3/uL (ref 1.4–7.7)
Neutrophils Relative %: 58 % (ref 43.0–77.0)
Platelets: 293 10*3/uL (ref 150.0–400.0)
RBC: 4.36 Mil/uL (ref 3.87–5.11)
RDW: 12.8 % (ref 11.5–15.5)
WBC: 8.2 10*3/uL (ref 4.0–10.5)

## 2020-01-13 LAB — LIPID PANEL
Cholesterol: 158 mg/dL (ref 0–200)
HDL: 59.5 mg/dL (ref >39.00)
LDL Cholesterol: 77 mg/dL (ref 0–99)
NonHDL: 98.84
Total CHOL/HDL Ratio: 3
Triglycerides: 109 mg/dL (ref 0.0–149.0)
VLDL: 21.8 mg/dL (ref 0.0–40.0)

## 2020-01-13 LAB — HEPATIC FUNCTION PANEL
ALT: 18 U/L (ref 0–35)
AST: 19 U/L (ref 0–37)
Albumin: 4.2 g/dL (ref 3.5–5.2)
Alkaline Phosphatase: 94 U/L (ref 39–117)
Bilirubin, Direct: 0.1 mg/dL (ref 0.0–0.3)
Total Bilirubin: 0.3 mg/dL (ref 0.2–1.2)
Total Protein: 6.8 g/dL (ref 6.0–8.3)

## 2020-01-13 LAB — TSH: TSH: 1.44 u[IU]/mL (ref 0.35–4.50)

## 2020-01-13 NOTE — Assessment & Plan Note (Signed)
New to provider, pt reports this is an ongoing thing but has worsened recently.  Initially felt it was due to caffeine intake but even when she stopped caffeine she is having palpitations more regularly.  Will refer back to Cards.

## 2020-01-13 NOTE — Patient Instructions (Signed)
Follow up in 6 months to recheck BP and cholesterol We'll notify you of your lab results and make any changes if needed Keep up the good work on healthy diet and regular exercise- you're doing great! Call with any questions or concerns Stay Safe!  Stay Healthy!! 

## 2020-01-13 NOTE — Progress Notes (Signed)
   Subjective:    Patient ID: [name], female    DOB: March 26, 1971, 49 y.o.   MRN: [id number]  HPI CPE- UTD on mammo.  Due for colon cancer screen.  No need for pap due to hysterectomy.  UTD on immunizations.  Pt is down 16 lbs since last visit!  Reviewed past medical, surgical, family and social histories.   Health Maintenance  Topic Date Due  . Hepatitis C Screening  Never done  . COVID-19 Vaccine (1) Never done  . INFLUENZA VACCINE  12/25/2019  . HEMOGLOBIN A1C  06/16/2020  . FOOT EXAM  07/11/2020  . OPHTHALMOLOGY EXAM  08/01/2020  . MAMMOGRAM  09/28/2020  . TETANUS/TDAP  02/20/2028  . PNEUMOCOCCAL POLYSACCHARIDE VACCINE AGE 102-64 HIGH RISK  Completed  . HIV Screening  Completed     Patient Care Team    Relationship Specialty Notifications Start End  Sheliah Hatch, MD PCP - General Family Medicine  06/24/11   Jerene Bears, MD Consulting Physician Gynecology  07/26/15   Carlus Pavlov, MD Consulting Physician Internal Medicine  07/26/15   Andee Poles, MD Consulting Physician Psychiatry  07/27/15       Review of Systems Patient reports no vision/ hearing changes, adenopathy,fever,  persistant/recurrent hoarseness , swallowing issues, chest pain, edema, persistant/recurrent cough, hemoptysis, dyspnea (rest/exertional/paroxysmal nocturnal), gastrointestinal bleeding (melena, rectal bleeding), abdominal pain, significant heartburn, bowel changes, GU symptoms (dysuria, hematuria, incontinence), Gyn symptoms (abnormal  bleeding, pain),  syncope, focal weakness, memory loss, numbness & tingling, skin/hair/nail changes, abnormal bruising or bleeding, anxiety, or depression.   + palpitations- increased w/ caffeine intake  This visit occurred during the SARS-CoV-2 public health emergency.  Safety protocols were in place, including screening questions prior to the visit, additional usage of staff PPE, and extensive cleaning of exam room while observing appropriate contact  time as indicated for disinfecting solutions.       Objective:   Physical Exam General Appearance:    Alert, cooperative, no distress, appears stated age, obese  Head:    Normocephalic, without obvious abnormality, atraumatic  Eyes:    PERRL, conjunctiva/corneas clear, EOM's intact, fundi    benign, both eyes  Ears:    Normal TM's and external ear canals, both ears  Nose:   Deferred due to COVID  Throat:   Neck:   Supple, symmetrical, trachea midline, no adenopathy;    Thyroid: no enlargement/tenderness/nodules  Back:     Symmetric, no curvature, ROM normal, no CVA tenderness  Lungs:     Clear to auscultation bilaterally, respirations unlabored  Chest Wall:    No tenderness or deformity   Heart:    Regular rate and rhythm, S1 and S2 normal, no murmur, rub   or gallop  Breast Exam:    Deferred to GYN  Abdomen:     Soft, non-tender, bowel sounds active all four quadrants,    no masses, no organomegaly  Genitalia:    Deferred to GYN  Rectal:    Extremities:   Extremities normal, atraumatic, no cyanosis or edema  Pulses:   2+ and symmetric all extremities  Skin:   Skin color, texture, turgor normal, no rashes or lesions  Lymph nodes:   Cervical, supraclavicular, and axillary nodes normal  Neurologic:   CNII-XII intact, normal strength, sensation and reflexes    throughout          Assessment & Plan:

## 2020-01-13 NOTE — Assessment & Plan Note (Signed)
Pt is down 16 lbs since last visit!  Applauded her efforts and encouraged her to continue.  Check labs to risk stratify.  Will follow.

## 2020-01-13 NOTE — Assessment & Plan Note (Signed)
Pt's PE WNL w/ exception of obesity.  UTD on mammo, immunizations.  Referral for colonoscopy placed.  Check labs.  Anticipatory guidance provided.

## 2020-01-15 NOTE — Progress Notes (Signed)
Cardiology Office Note:   Date:  01/17/2020  NAME:  [name]    MRN: [id number] DOB:  [birth year]   PCP:  Crystal Hatchabori, Katherine E, MD  Cardiologist:  No primary care provider on file.   Referring MD: Crystal Hatchabori, Katherine E, MD   Chief Complaint  Patient presents with  . Palpitations   History of Present Illness:   [name] is a 49 y.o. female with a hx of Diabetes, obesity who is being seen today for the evaluation of palpitations at the request of Crystal Hatchabori, Katherine E, MD. TSH 1.44.  She reports she has had an irregular heartbeat for years.  She apparently was evaluated by cardiology in the past.  She reports she wore a monitor nearly 20 years ago.  She is manage these conservatively.  She reports over the past 3 to 4 months she has noticed increased rapid heartbeat sensation.  She reports that she feels her heart racing.  It can last seconds.  Associated symptoms include shortness of breath.  She reports that are bothersome.  She did increase her caffeine consumption several months ago but has reduced it and not noticed any improvement in her symptoms.  Symptoms occur 2 times daily.  Mainly occur with rest.  Can occur with exertion.  No real identifiable trigger or alleviating factor.  No energy drink consumption reported.  She does not smoke, drink alcohol or use drugs.  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  EKG today is normal.  She does have sleep apnea but does not use her CPAP machine.  She does have diabetes and hypertension.  Appears to be controlled.  She is not exercising currently but reports she is going to start.  She does have several orthopedic issues.  She does report several family members who have atrial fibrillation.  She is concerned about this.  Labs reviewed below.  Problem List 1. Diabetes -A1c 8.1 2. HLD -T chol 158, HDL 59, LDL 77, TG 109 3. BMI 41 4. OSA   Past Medical History: Past Medical History:  Diagnosis Date  .  Allergy   . Asthma    pt stated treated as ashtma but not really asthma  . Bipolar disorder (HCC)   . Chicken pox   . Complication of anesthesia    oxygen saturation dropped [facility]  . Depression   . Diabetes mellitus   . Dyspnea   . GERD (gastroesophageal reflux disease)   . Hyperlipidemia   . Hypertension    readings  . Hypothyroidism   . Irregular heartbeat    saw dr [name] sees cardiology as needed  . Migraine   . Sleep apnea    uses cpap    Past Surgical History: Past Surgical History:  Procedure Laterality Date  . ANKLE SURGERY  1989   left  . BACK SURGERY  1999  . CESAREAN SECTION     1995  . LAPAROSCOPIC ASSISTED VAGINAL HYSTERECTOMY  2009     BSO fibroids, DUB, pelvic pain  . 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 REPAIR N/A 04/14/2016   Procedure: LAPAROSCOPIC ROUX-EN-Y GASTRIC BYPASS  WITH UPPER ENDOSCOPY;  Surgeon: Crystal FellowsBenjamin Hoxworth, MD;  Location: [facility];  Service: General;  Laterality: N/A;  . SHOULDER ARTHROSCOPY WITH OPEN ROTATOR CUFF REPAIR Right 01/02/2017   Procedure: Right shoulder arthroscopy, A-subcromial decompression, mini open rotator cuff repair, open distal clavicle resection, biceps tenodesis;  Surgeon: Crystal LowNorris, Steve, MD;  Location:  [facility] OR;  Service: Orthopedics;  Laterality: Right;  . THYROIDECTOMY, PARTIAL  2001   removed left  . UMBILICAL HERNIA REPAIR  2003  . WISDOM TOOTH EXTRACTION      Current Medications: Current Meds  Medication Sig  . ARIPiprazole (ABILIFY) 10 MG tablet Take 10 mg by mouth daily.   [name] atorvastatin (LIPITOR) 40 MG tablet TAKE 1 TABLET BY MOUTH DAILY  . Biotin 92493 MCG TABS Take 1 tablet by mouth daily.  . Blood Glucose Monitoring Suppl (FREESTYLE LITE) DEVI Use to check sugar 2 times daily  . Calcium Carbonate-Vit D-Min (CALTRATE 600+D PLUS MINIS PO) Take 1 tablet by mouth 3 (three) times daily.  . carbamazepine (TEGRETOL XR) 200 MG 12 hr tablet 1 tablet in the morning, 2 at  night  . clobetasol ointment (TEMOVATE) 0.05 % APPLY 1 APPLICATION TOPICALLY 2 TIMES DAILY. DO NOT USE FOR MORE THAN 7 DAYS  . gabapentin (NEURONTIN) 600 MG tablet Take 600 mg by mouth 3 (three) times daily.   [name] glucose blood (FREESTYLE LITE) test strip Use as instructed to check sugar 2 times daily  . hydrochlorothiazide (MICROZIDE) 12.5 MG capsule TAKE 1 CAPSULE BY MOUTH ONCE DAILY  . lamoTRIgine (LAMICTAL) 200 MG tablet Take 200 mg by mouth 2 (two) times daily.  . Lancets (FREESTYLE) lancets Use as instructed to check sugar 2 times daily  . lisinopril (ZESTRIL) 5 MG tablet TAKE 1 TABLET BY MOUTH ONCE DAILY  . metFORMIN (GLUCOPHAGE) 500 MG tablet TAKE 2 TABLETS BY MOUTH TWICE A DAY WITH A MEAL  . Multiple Vitamin (MULTIVITAMIN) tablet Take 1 tablet by mouth daily.  . tiaGABine (GABITRIL) 4 MG tablet Take 8 mg by mouth 2 (two) times daily.  . TRULICITY 0.75 MG/0.5ML SOPN INJECT 0.75 MG UNDER THE SKIN ONCE EVERY MORNING     Allergies:    Zofran Frazier Richards hcl]   Social History: Social History   Socioeconomic History  . Marital status: Married    Spouse name: Not on file  . Number of children: 1  . Years of education: Not on file  . Highest education level: Not on file  Occupational History  . Occupation: nursing  Tobacco Use  . Smoking status: Never Smoker  . Smokeless tobacco: Never Used  Vaping Use  . Vaping Use: Never used  Substance and Sexual Activity  . Alcohol use: No    Alcohol/week: 0.0 standard drinks  . Drug use: No  . Sexual activity: Yes    Partners: Male  Other Topics Concern  . Not on file  Social History Narrative  . Not on file   Social Determinants of Health   Financial Resource Strain:   . Difficulty of Paying Living Expenses: Not on file  Food Insecurity:   . Worried About Programme researcher, broadcasting/film/video in the Last Year: Not on file  . Ran Out of Food in the Last Year: Not on file  Transportation Needs:   . Lack of Transportation (Medical): Not on file    . Lack of Transportation (Non-Medical): Not on file  Physical Activity:   . Days of Exercise per Week: Not on file  . Minutes of Exercise per Session: Not on file  Stress:   . Feeling of Stress : Not on file  Social Connections:   . Frequency of Communication with Friends and Family: Not on file  . Frequency of Social Gatherings with Friends and Family: Not on file  . Attends Religious Services: Not on file  .  Active Member of Clubs or Organizations: Not on file  . Attends Banker Meetings: Not on file  . Marital Status: Not on file     Family History: The patient's family history includes Anxiety disorder in her mother; Asthma in her father; Diabetes in her mother; Heart disease in her father and mother; Hyperlipidemia in her mother; Hypertension in her father; Lupus in her father; Mental illness in her paternal grandfather; Stroke in her maternal aunt and paternal grandfather. There is no history of Breast cancer.  ROS:   All other ROS reviewed and negative. Pertinent positives noted in the HPI.     EKGs/Labs/Other Studies Reviewed:   The following studies were personally reviewed by me today:  EKG:  EKG is ordered today.  The ekg ordered today demonstrates normal sinus rhythm, heart rate 66, no acute ST-T changes, no evidence of prior infarction, and was personally reviewed by me.   Recent Labs: 01/13/2020: ALT 18; BUN 11; Creatinine, Ser 0.56; Hemoglobin 13.0; Platelets 293.0; Potassium 5.1; Sodium 133; TSH 1.44   Recent Lipid Panel    Component Value Date/Time   CHOL 158 01/13/2020 0944   TRIG 109.0 01/13/2020 0944   HDL 59.50 01/13/2020 0944   CHOLHDL 3 01/13/2020 0944   VLDL 21.8 01/13/2020 0944   LDLCALC 77 01/13/2020 0944   LDLDIRECT 112.0 06/21/2014 1542    Physical Exam:   VS:  BP 140/84   Pulse 66   Ht 5\' 9"  (1.753 m)   Wt 284 lb (128.8 kg)   LMP 01/25/2008   BMI 41.94 kg/m    Wt Readings from Last 3 Encounters:  01/17/20 284 lb (128.8 kg)   01/13/20 268 lb (121.6 kg)  12/15/19 (!) 284 lb (128.8 kg)    General: Well nourished, well developed, in no acute distress Heart: Atraumatic, normal size  Eyes: PEERLA, EOMI  Neck: Supple, no JVD Endocrine: No thryomegaly Cardiac: Normal S1, S2; RRR; no murmurs, rubs, or gallops Lungs: Clear to auscultation bilaterally, no wheezing, rhonchi or rales  Abd: Soft, nontender, no hepatomegaly  Ext: No edema, pulses 2+ Musculoskeletal: No deformities, BUE and BLE strength normal and equal Skin: Warm and dry, no rashes   Neuro: Alert and oriented to person, place, time, and situation, CNII-XII grossly intact, no focal deficits  Psych: Normal mood and affect   ASSESSMENT:   Marissah Uri Covey is a 49 y.o. female who presents for the following: 1. Palpitations   2. Mixed hyperlipidemia     PLAN:   1. Palpitations -Daily episodes of palpitations.  EKG normal.  Cardiovascular exam normal.  Recent thyroid studies normal.  We will proceed with a 7-day Zio patch to exclude arrhythmia.  Could be stress related.  We will see how she does. -Regardless but we find recommend she restart her CPAP.  She could have sleep apnea is leading to ectopic beats.  She will do this.  I will see her back in 3 months after the above testing.  2. Mixed hyperlipidemia -She is diabetic.  Most recent LDL cholesterol 77.  She will continue her statin.   Disposition: Return in about 3 months (around 04/18/2020).  Medication Adjustments/Labs and Tests Ordered: Current medicines are reviewed at length with the patient today.  Concerns regarding medicines are outlined above.  No orders of the defined types were placed in this encounter.  No orders of the defined types were placed in this encounter.   There are no Patient Instructions on file for this visit.  Signed, Lenna Gilford. Flora Lipps, MD [facility]  [address] [place], [place] [address] [phone]  01/17/2020 10:30 AM

## 2020-01-16 ENCOUNTER — Encounter: Payer: Self-pay | Admitting: General Practice

## 2020-01-16 LAB — HEPATITIS C ANTIBODY
Hepatitis C Ab: NONREACTIVE
SIGNAL TO CUT-OFF: 0.01 (ref <1.00)

## 2020-01-17 ENCOUNTER — Other Ambulatory Visit: Payer: Self-pay

## 2020-01-17 ENCOUNTER — Ambulatory Visit: Payer: 59 | Admitting: Cardiovascular Disease

## 2020-01-17 ENCOUNTER — Encounter: Payer: Self-pay | Admitting: *Deleted

## 2020-01-17 ENCOUNTER — Encounter: Payer: Self-pay | Admitting: Cardiovascular Disease

## 2020-01-17 VITALS — BP 140/84 | HR 66 | Ht 69.0 in | Wt 284.0 lb

## 2020-01-17 DIAGNOSIS — E782 Mixed hyperlipidemia: Secondary | ICD-10-CM

## 2020-01-17 DIAGNOSIS — R002 Palpitations: Secondary | ICD-10-CM | POA: Diagnosis not present

## 2020-01-17 NOTE — Patient Instructions (Signed)
Medication Instructions:  The current medical regimen is effective;  continue present plan and medications.  *If you need a refill on your cardiac medications before your next appointment, please call your pharmacy*   Testing/Procedures: Your physician has recommended that you wear a 7 DAY ZIO-PATCH monitor. The Zio patch cardiac monitor continuously records heart rhythm data for up to 14 days, this is for patients being evaluated for multiple types heart rhythms. For the first 24 hours post application, please avoid getting the Zio monitor wet in the shower or by excessive sweating during exercise. After that, feel free to carry on with regular activities. Keep soaps and lotions away from the ZIO XT Patch.  This will be mailed to you, please expect 7-10 days to receive.    Applying the monitor   Shave hair from upper left chest.   Hold abrader disc by orange tab.  Rub abrader in 40 strokes over left upper chest as indicated in your monitor instructions.   Clean area with 4 enclosed alcohol pads .  Use all pads to assure are is cleaned thoroughly.  Let dry.   Apply patch as indicated in monitor instructions.  Patch will be place under collarbone on left side of chest with arrow pointing upward.   Rub patch adhesive wings for 2 minutes.Remove white label marked "1".  Remove white label marked "2".  Rub patch adhesive wings for 2 additional minutes.   While looking in a mirror, press and release button in center of patch.  A small green light will flash 3-4 times .  This will be your only indicator the monitor has been turned on.     Do not shower for the first 24 hours.  You may shower after the first 24 hours.   Press button if you feel a symptom. You will hear a small click.  Record Date, Time and Symptom in the Patient Log Book.   When you are ready to remove patch, follow instructions on last 2 pages of Patient Log Book.  Stick patch monitor onto last page of Patient Log Book.     Place Patient Log Book in [place] box.  Use locking tab on box and tape box closed securely.  The [employer] and [employer] has [employer] on it.  Please place in mailbox as soon as possible.  Your physician should have your test results approximately 7 days after the monitor has been mailed back to [facility].   Call [facility] Customer Care at [phone] if you have questions regarding your ZIO XT patch monitor.  Call them immediately if you see an orange light blinking on your monitor.   If your monitor falls off in less than 4 days contact our Monitor department at [phone].  If your monitor becomes loose or falls off after 4 days call [employer] at [phone] for suggestions on securing your monitor     Follow-Up: At [facility], you and your health needs are our priority.  As part of our continuing mission to provide you with exceptional heart care, we have created designated Provider Care Teams.  These Care Teams include your primary Cardiologist (physician) and Advanced Practice Providers (APPs -  Physician Assistants and Nurse Practitioners) who all work together to provide you with the care you need, when you need it.  We recommend signing up for the patient portal called "MyChart".  Sign up information is provided on this After Visit Summary.  MyChart is used to connect with patients for Virtual  Visits (Telemedicine).  Patients are able to view lab/test results, encounter notes, upcoming appointments, etc.  Non-urgent messages can be sent to your provider as well.   To learn more about what you can do with MyChart, go to [email or web address].    Your next appointment:   3 month(s)  The format for your next appointment:   In Person  Provider:   Lennie Odor, MD

## 2020-01-17 NOTE — Progress Notes (Signed)
Patient ID: [name], female   DOB: 09-01-1970, 49 y.o.   MRN: [id number] Patient enrolled for [employer] to ship a 7 day ZIO XT long term holter monitor to her home.

## 2020-01-19 DIAGNOSIS — H5310 Unspecified subjective visual disturbances: Secondary | ICD-10-CM | POA: Diagnosis not present

## 2020-01-22 ENCOUNTER — Ambulatory Visit (INDEPENDENT_AMBULATORY_CARE_PROVIDER_SITE_OTHER): Payer: 59

## 2020-01-22 DIAGNOSIS — R002 Palpitations: Secondary | ICD-10-CM

## 2020-01-25 MED FILL — lamoTRIgine 200 MG TABS: 200 | 90 days supply | Qty: 180 | Fill #1

## 2020-01-25 MED FILL — TRULICITY 0.75 MG/0.5 ML PE: 0.75 | 28 days supply | Qty: 2 | Fill #2

## 2020-01-25 MED FILL — ARIPIPRAZOLE 10 MG TABS: 10 | 90 days supply | Qty: 90 | Fill #1

## 2020-01-31 ENCOUNTER — Telehealth: Payer: 59 | Admitting: Nurse Practitioner

## 2020-01-31 DIAGNOSIS — M545 Low back pain, unspecified: Secondary | ICD-10-CM

## 2020-01-31 MED ORDER — NAPROXEN 500 MG PO TABS
500.0000 mg | ORAL_TABLET | Freq: Two times a day (BID) | ORAL | 0 refills | Status: DC
Start: 2020-01-31 — End: 2020-04-12

## 2020-01-31 MED ORDER — CYCLOBENZAPRINE HCL 10 MG PO TABS
10.0000 mg | ORAL_TABLET | Freq: Three times a day (TID) | ORAL | 0 refills | Status: DC | PRN
Start: 2020-01-31 — End: 2020-04-12

## 2020-01-31 NOTE — Progress Notes (Signed)

## 2020-02-02 ENCOUNTER — Other Ambulatory Visit: Payer: Self-pay | Admitting: Family Medicine

## 2020-02-02 MED FILL — TEGRETOL XR 200 MG TABLET: 200 | 90 days supply | Qty: 270 | Fill #1

## 2020-02-02 MED FILL — tiaGABine HCL 4 MG TABS: 4 | 90 days supply | Qty: 360 | Fill #1

## 2020-02-02 MED FILL — GABAPENTIN 600 MG TABLET: 600 | 90 days supply | Qty: 270 | Fill #1

## 2020-02-02 MED FILL — LISINOPRIL 5 MG TABLET: 5 | 90 days supply | Qty: 90 | Fill #0

## 2020-02-02 MED FILL — HYDROCHLOROTHIAZIDE 12.5 MG: 12.5 | 90 days supply | Qty: 90 | Fill #0

## 2020-02-06 DIAGNOSIS — M545 Low back pain: Secondary | ICD-10-CM | POA: Diagnosis not present

## 2020-02-06 DIAGNOSIS — S39012A Strain of muscle, fascia and tendon of lower back, initial encounter: Secondary | ICD-10-CM | POA: Diagnosis not present

## 2020-02-06 DIAGNOSIS — Z6841 Body Mass Index (BMI) 40.0 and over, adult: Secondary | ICD-10-CM | POA: Diagnosis not present

## 2020-02-06 DIAGNOSIS — M5136 Other intervertebral disc degeneration, lumbar region: Secondary | ICD-10-CM | POA: Diagnosis not present

## 2020-02-07 DIAGNOSIS — R002 Palpitations: Secondary | ICD-10-CM | POA: Diagnosis not present

## 2020-02-17 DIAGNOSIS — M545 Low back pain: Secondary | ICD-10-CM | POA: Diagnosis not present

## 2020-02-27 ENCOUNTER — Encounter: Payer: Self-pay | Admitting: Gastroenterology

## 2020-02-28 ENCOUNTER — Other Ambulatory Visit: Payer: Self-pay | Admitting: Family Medicine

## 2020-02-28 MED FILL — METFORMIN HCL 500 MG TABS: 500 | 90 days supply | Qty: 360 | Fill #1

## 2020-02-28 MED FILL — CLOBETASOL PROP 0.05% OINT: 0.05 | 7 days supply | Qty: 60 | Fill #0

## 2020-02-28 MED FILL — TRULICITY 0.75 MG/0.5 ML PE: 0.75 | 28 days supply | Qty: 2 | Fill #3

## 2020-03-08 DIAGNOSIS — M545 Low back pain, unspecified: Secondary | ICD-10-CM | POA: Diagnosis not present

## 2020-03-28 ENCOUNTER — Other Ambulatory Visit: Payer: Self-pay | Admitting: Family Medicine

## 2020-03-28 MED FILL — ATORVASTATIN CALCIUM 40 MG: 40 | 90 days supply | Qty: 90 | Fill #0

## 2020-04-12 ENCOUNTER — Other Ambulatory Visit: Payer: Self-pay

## 2020-04-12 ENCOUNTER — Ambulatory Visit: Payer: 59 | Admitting: Cardiovascular Disease

## 2020-04-12 ENCOUNTER — Ambulatory Visit (AMBULATORY_SURGERY_CENTER): Payer: Self-pay

## 2020-04-12 ENCOUNTER — Other Ambulatory Visit: Payer: Self-pay | Admitting: Gastroenterology

## 2020-04-12 ENCOUNTER — Encounter: Payer: Self-pay | Admitting: Cardiovascular Disease

## 2020-04-12 ENCOUNTER — Encounter: Payer: Self-pay | Admitting: Gastroenterology

## 2020-04-12 VITALS — Ht 69.0 in | Wt 286.0 lb

## 2020-04-12 VITALS — BP 116/72 | HR 67 | Ht 69.0 in | Wt 285.6 lb

## 2020-04-12 DIAGNOSIS — R002 Palpitations: Secondary | ICD-10-CM

## 2020-04-12 DIAGNOSIS — Z1211 Encounter for screening for malignant neoplasm of colon: Secondary | ICD-10-CM

## 2020-04-12 DIAGNOSIS — E782 Mixed hyperlipidemia: Secondary | ICD-10-CM

## 2020-04-12 MED ORDER — NA SULFATE-K SULFATE-MG SULF 17.5-3.13-1.6 GM/177ML PO SOLN
1.0000 | Freq: Once | ORAL | 0 refills | Status: DC
Start: 2020-04-12 — End: 2020-04-12

## 2020-04-12 MED FILL — SUPREP BOWEL PREP KIT: 17.5-3.13-1 | 1 days supply | Qty: 354 | Fill #0

## 2020-04-12 NOTE — Patient Instructions (Signed)
Medication Instructions:  Your Physician recommend you continue on your current medication as directed.    *If you need a refill on your cardiac medications before your next appointment, please call your pharmacy*   Lab Work: None  Testing/Procedures: None   Follow-Up: At [facility], you and your health needs are our priority.  As part of our continuing mission to provide you with exceptional heart care, we have created designated Provider Care Teams.  These Care Teams include your primary Cardiologist (physician) and Advanced Practice Providers (APPs -  Physician Assistants and Nurse Practitioners) who all work together to provide you with the care you need, when you need it.  We recommend signing up for the patient portal called "MyChart".  Sign up information is provided on this After Visit Summary.  MyChart is used to connect with patients for Virtual Visits (Telemedicine).  Patients are able to view lab/test results, encounter notes, upcoming appointments, etc.  Non-urgent messages can be sent to your provider as well.   To learn more about what you can do with MyChart, go to [email or web address].    Your next appointment:   1 year(s)  The format for your next appointment:   In Person  Provider:   [place] O'Neal, MD    

## 2020-04-12 NOTE — Progress Notes (Signed)
Cardiology Office Note:   Date:  04/12/2020  NAME:  [name]    MRN: [id number] DOB:  09-Apr-1971   PCP:  Midge Minium, MD  Cardiologist:  No primary care provider on file.   Referring MD: Midge Minium, MD   Chief Complaint  Patient presents with  . Palpitations   History of Present Illness:   [name] is a 49 y.o. female with a hx of DM, HTN, HLD who presents for follow-up of palpitations. Monitor with brief a tach episodes (<3 seconds) that did not explain symptoms. Symptoms occurred with normal sinus rhythm.  She reports she is still getting episodes of palpitations.  Symptoms occur 2-3 times per week.  When they do occur they last 5 to 10 minutes.  Described as extra heartbeat sensation.  We did go over the results of her monitor which demonstrated no significant arrhythmias.  She reports that she noticed she was drinking more caffeinated beverages.  She apparently was going to [facility] quite often and would have palpitations when drinking [employer] coffee.  She is cut back on this.  Symptoms have improved dramatically.  They are occurring much less than they were.  We did discuss the possibility of pursuing metoprolol medications to slow the heart rate.  Her blood pressure is 116/72.  She reports she tried these medications in the past and has had no benefit due to hypotension.  She reports for now she would like to continue with conservative management as she did not find any atrial fibrillation on her monitor.  Apparently several family members on her father side had atrial fibrillation.  She has been quite concerned she may have that.  We also discussed using her apple watch to do ECG monitoring periodically on her heart rhythm.  She will look into this.  She denies any chest pain, shortness of breath and palpitations in office.  Problem List 1. Diabetes -A1c 8.1 2. HLD -T chol 158, HDL 59, LDL 77, TG 109 3. BMI 41 4. OSA  Past Medical  History: Past Medical History:  Diagnosis Date  . Allergy    seasonal allergies  . Anemia    hx of  . Anxiety    on meds  . Asthma    hx of  . Bipolar disorder ([place])   . Cataract    bilateral sx  . Chicken pox   . Complication of anesthesia    oxygen saturation dropped after hysterectomy 2009 at [facility]  . Depression    on meds  . Diabetes mellitus    on meds  . Dyspnea   . GERD (gastroesophageal reflux disease)    hx of  . Hyperlipidemia    on meds  . Hypertension    readings  . Hypothyroidism    hx of-LEFT thyroid removed  . Irregular heartbeat    saw dr Ron Parker sees -cardiology as needed  . Migraine   . Sleep apnea    uses cpap    Past Surgical History: Past Surgical History:  Procedure Laterality Date  . ANKLE SURGERY Left 1989  . BACK SURGERY  1999  . CATARACT EXTRACTION, BILATERAL    . CESAREAN SECTION     1995  . LAPAROSCOPIC ASSISTED VAGINAL HYSTERECTOMY  2009     BSO fibroids, DUB, pelvic pain  . 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 REPAIR N/A 04/14/2016   Procedure: LAPAROSCOPIC ROUX-EN-Y GASTRIC BYPASS  WITH UPPER ENDOSCOPY;  Surgeon: Excell Seltzer, MD;  Location: [facility];  Service: General;  Laterality: N/A;  . SHOULDER ARTHROSCOPY WITH OPEN ROTATOR CUFF REPAIR Right 01/02/2017   Procedure: Right shoulder arthroscopy, A-subcromial decompression, mini open rotator cuff repair, open distal clavicle resection, biceps tenodesis;  Surgeon: Netta Cedars, MD;  Location: [facility];  Service: Orthopedics;  Laterality: Right;  . THYROIDECTOMY, PARTIAL Left 2001  . UMBILICAL HERNIA REPAIR  2003  . WISDOM TOOTH EXTRACTION      Current Medications: Current Meds  Medication Sig  . ARIPiprazole (ABILIFY) 10 MG tablet Take 10 mg by mouth daily.   . ASPIRIN PO Take 162 mg by mouth daily.  [name] atorvastatin (LIPITOR) 40 MG tablet TAKE 1 TABLET BY MOUTH DAILY  . Biotin 10000 MCG TABS Take 1 tablet by mouth daily.  . Blood Glucose Monitoring Suppl  (FREESTYLE LITE) DEVI Use to check sugar 2 times daily  . Calcium Carbonate-Vit D-Min (CALTRATE 600+D PLUS MINIS PO) Take 1 tablet by mouth 3 (three) times daily.  . carbamazepine (TEGRETOL XR) 200 MG 12 hr tablet 1 tablet in the morning, 2 at night  . clobetasol ointment (TEMOVATE) 5.40 % APPLY 1 APPLICATION TOPICALLY 2 TIMES DAILY. DO NOT USE FOR MORE THAN 7 DAYS  . gabapentin (NEURONTIN) 600 MG tablet Take 600 mg by mouth 3 (three) times daily.   . Glucosamine HCl (GLUCOSAMINE PO) Take 3 tablets by mouth daily.  [name] glucose blood (FREESTYLE LITE) test strip Use as instructed to check sugar 2 times daily  . hydrochlorothiazide (MICROZIDE) 12.5 MG capsule TAKE 1 CAPSULE BY MOUTH ONCE DAILY  . lamoTRIgine (LAMICTAL) 200 MG tablet Take 200 mg by mouth 2 (two) times daily.  . Lancets (FREESTYLE) lancets Use as instructed to check sugar 2 times daily  . lisinopril (ZESTRIL) 5 MG tablet TAKE 1 TABLET BY MOUTH ONCE DAILY  . Loratadine (CLARITIN PO) Take by mouth daily.  . metFORMIN (GLUCOPHAGE) 500 MG tablet TAKE 2 TABLETS BY MOUTH TWICE A DAY WITH A MEAL  . Multiple Vitamin (MULTIVITAMIN) tablet Take 1 tablet by mouth daily.  . Na Sulfate-K Sulfate-Mg Sulf 17.5-3.13-1.6 GM/177ML SOLN Take 1 kit by mouth once for 1 dose. Apply Coupon=BIN: [id number] PCN: CN GROUP: [id number] ID: [id number]  . tiaGABine (GABITRIL) 4 MG tablet Take 8 mg by mouth 2 (two) times daily.  . TRULICITY 2.95 MW/4.1LK SOPN INJECT 0.75 MG UNDER THE SKIN ONCE EVERY MORNING (Patient taking differently: once a week. )  . Turmeric (QC TUMERIC COMPLEX PO) Take by mouth daily.     Allergies:    Zofran Alvis Lemmings hcl]   Social History: Social History   Socioeconomic History  . Marital status: Married    Spouse name: Not on file  . Number of children: 1  . Years of education: Not on file  . Highest education level: Not on file  Occupational History  . Occupation: nursing  Tobacco Use  . Smoking status: Never Smoker  .  Smokeless tobacco: Never Used  Vaping Use  . Vaping Use: Never used  Substance and Sexual Activity  . Alcohol use: No    Alcohol/week: 0.0 standard drinks  . Drug use: No  . Sexual activity: Yes    Partners: Male  Other Topics Concern  . Not on file  Social History Narrative  . Not on file   Social Determinants of Health   Financial Resource Strain:   . Difficulty of Paying Living Expenses: Not on file  Food Insecurity:   . Worried About Charity fundraiser in the  Last Year: Not on file  . Ran Out of Food in the Last Year: Not on file  Transportation Needs:   . Lack of Transportation (Medical): Not on file  . Lack of Transportation (Non-Medical): Not on file  Physical Activity:   . Days of Exercise per Week: Not on file  . Minutes of Exercise per Session: Not on file  Stress:   . Feeling of Stress : Not on file  Social Connections:   . Frequency of Communication with Friends and Family: Not on file  . Frequency of Social Gatherings with Friends and Family: Not on file  . Attends Religious Services: Not on file  . Active Member of Clubs or Organizations: Not on file  . Attends Archivist Meetings: Not on file  . Marital Status: Not on file     Family History: The patient's family history includes Anxiety disorder in her mother; Asthma in her father; Colon polyps (age of onset: 66) in her father; Diabetes in her mother; Heart disease in her father and mother; Hyperlipidemia in her mother; Hypertension in her father; Lupus in her father; Mental illness in her paternal grandfather; Stroke in her maternal aunt and paternal grandfather. There is no history of Breast cancer, Colon cancer, Esophageal cancer, Stomach cancer, or Rectal cancer.  ROS:   All other ROS reviewed and negative. Pertinent positives noted in the HPI.     EKGs/Labs/Other Studies Reviewed:   The following studies were personally reviewed by me today:   [facility] 02/09/2020 1. Brief ectopic atrial  tachycardia episodes detected (3 episodes in 7 days; longest duration 2.6 seconds).  2. No atrial fibrillation.  3. Rare ectopy.     Recent Labs: 01/13/2020: ALT 18; BUN 11; Creatinine, Ser 0.56; Hemoglobin 13.0; Platelets 293.0; Potassium 5.1; Sodium 133; TSH 1.44   Recent Lipid Panel    Component Value Date/Time   CHOL 158 01/13/2020 0944   TRIG 109.0 01/13/2020 0944   HDL 59.50 01/13/2020 0944   CHOLHDL 3 01/13/2020 0944   VLDL 21.8 01/13/2020 0944   LDLCALC 77 01/13/2020 0944   LDLDIRECT 112.0 06/21/2014 1542    Physical Exam:   VS:  BP 116/72 (BP Location: Left Arm, Patient Position: Sitting)   Pulse 67   Ht '5\' 9"'  (1.753 m)   Wt 285 lb 9.6 oz (129.5 kg)   LMP 01/25/2008   SpO2 96%   BMI 42.18 kg/m    Wt Readings from Last 3 Encounters:  04/12/20 285 lb 9.6 oz (129.5 kg)  04/12/20 286 lb (129.7 kg)  01/17/20 284 lb (128.8 kg)    General: Well nourished, well developed, in no acute distress Heart: Atraumatic, normal size  Eyes: PEERLA, EOMI  Neck: Supple, no JVD Endocrine: No thryomegaly Cardiac: Normal S1, S2; RRR; no murmurs, rubs, or gallops Lungs: Clear to auscultation bilaterally, no wheezing, rhonchi or rales  Abd: Soft, nontender, no hepatomegaly  Ext: No edema, pulses 2+ Musculoskeletal: No deformities, BUE and BLE strength normal and equal Skin: Warm and dry, no rashes   Neuro: Alert and oriented to person, place, time, and situation, CNII-XII grossly intact, no focal deficits  Psych: Normal mood and affect   ASSESSMENT:   [name] is a 49 y.o. female who presents for the following: 1. Palpitations   2. Mixed hyperlipidemia     PLAN:   1. Palpitations -Recent monitor with brief ectopic atrial beats that were less than 3 seconds in duration.  She does continue to have symptoms  of palpitations but they have lessened with reduction in caffeine consumption.  For now she is opted for conservative management and she will just monitor her  symptoms moving forward.  We did discuss the apple watch in the ECG monitor on this device.  She will look into this and this will be a good way for her to evaluate if she developed atrial fibrillation.  Apparently several family members on her father side developed atrial fibrillation later in life and she is worried about that.  I did tell her that this would be a great way for her to reassure her that her heart rate is normal.  She will plan to see [place] yearly.  2. Mixed hyperlipidemia -Continue high intensity statin due to diabetes.  Most recent LDL cholesterol around goal 77.   Disposition: Return in about 1 year (around 04/12/2021).  Medication Adjustments/Labs and Tests Ordered: Current medicines are reviewed at length with the patient today.  Concerns regarding medicines are outlined above.  No orders of the defined types were placed in this encounter.  No orders of the defined types were placed in this encounter.   Patient Instructions  Medication Instructions:  Your Physician recommend you continue on your current medication as directed.    *If you need a refill on your cardiac medications before your next appointment, please call your pharmacy*   Lab Work: None   Testing/Procedures: None   Follow-Up: At [facility], you and your health needs are our priority.  As part of our continuing mission to provide you with exceptional heart care, we have created designated Provider Care Teams.  These Care Teams include your primary Cardiologist (physician) and Advanced Practice Providers (APPs -  Physician Assistants and Nurse Practitioners) who all work together to provide you with the care you need, when you need it.  We recommend signing up for the patient portal called "MyChart".  Sign up information is provided on this After Visit Summary.  MyChart is used to connect with patients for Virtual Visits (Telemedicine).  Patients are able to view lab/test results, encounter notes,  upcoming appointments, etc.  Non-urgent messages can be sent to your provider as well.   To learn more about what you can do with MyChart, go to [email or web address].    Your next appointment:   1 year(s)  The format for your next appointment:   In Person  Provider:   Eleonore Chiquito, MD       Time Spent with Patient: I have spent a total of 25 minutes with patient reviewing hospital notes, telemetry, EKGs, labs and examining the patient as well as establishing an assessment and plan that was discussed with the patient.  > 50% of time was spent in direct patient care.  Signed, Addison Naegeli. Audie Box, [place]  [address], [place], [place] [address] [phone]  04/12/2020 7:22 PM

## 2020-04-12 NOTE — Progress Notes (Signed)
No egg or soy allergy known to patient  No issues with past sedation with any surgeries or procedures No intubation problems in the past  No FH of Malignant Hyperthermia No diet pills per patient No home 02 use per patient  No blood thinners per patient  Pt denies issues with constipation  No A fib or A flutter  EMMI video via MyChart  COVID 19 guidelines implemented in PV today with Pt and RN  COVID vaccines completed on 06/2019 per pt;  Due to the COVID-19 pandemic we are asking patients to follow these guidelines. Please only bring one care partner. Please be aware that your care partner may wait in the car in the parking lot or if they feel like they will be too hot to wait in the car, they may wait in the lobby on the 4th floor. All care partners are required to wear a mask the entire time (we do not have any that we can provide them), they need to practice social distancing, and we will do a Covid check for all patient's and care partners when you arrive. Also we will check their temperature and your temperature. If the care partner waits in their car they need to stay in the parking lot the entire time and we will call them on their cell phone when the patient is ready for discharge so they can bring the car to the front of the building. Also all patient's will need to wear a mask into building.  

## 2020-04-13 MED FILL — TRULICITY 0.75 MG/0.5 ML PE: 0.75 | 28 days supply | Qty: 2 | Fill #4

## 2020-04-17 ENCOUNTER — Other Ambulatory Visit: Payer: Self-pay

## 2020-04-17 ENCOUNTER — Telehealth (INDEPENDENT_AMBULATORY_CARE_PROVIDER_SITE_OTHER): Payer: 59 | Admitting: Internal Medicine

## 2020-04-17 ENCOUNTER — Encounter: Payer: Self-pay | Admitting: Internal Medicine

## 2020-04-17 ENCOUNTER — Other Ambulatory Visit: Payer: Self-pay | Admitting: Internal Medicine

## 2020-04-17 DIAGNOSIS — E782 Mixed hyperlipidemia: Secondary | ICD-10-CM | POA: Diagnosis not present

## 2020-04-17 DIAGNOSIS — E119 Type 2 diabetes mellitus without complications: Secondary | ICD-10-CM

## 2020-04-17 MED ORDER — TRULICITY 1.5 MG/0.5ML ~~LOC~~ SOAJ: 1.5000 mg | SUBCUTANEOUS | 3 refills | Status: DC

## 2020-04-17 NOTE — Patient Instructions (Signed)
Please continue: - Metformin 1000 mg 2x a day  Please increase: - Trulicity 1.5 mg weekly  Please come back for a follow-up appointment in 4 months.

## 2020-04-17 NOTE — Progress Notes (Signed)
Patient ID: Sieara Bremer, female   DOB: Dec 02, 1970, 49 y.o.   MRN: [id number]  Patient location: Home My location: Office Persons participating in the virtual visit: patient, provider  Referring Provider: Sheliah Hatch, MD  I connected with the patient on 04/17/20 at  3:41 PM EST by a video enabled telemedicine application and verified that I am speaking with the correct person.   I discussed the limitations of evaluation and management by telemedicine and the availability of in person appointments. The patient expressed understanding and agreed to proceed.   Details of the encounter are shown below.  HPI: Natonya Myrene Bougher is a 49 y.o.-year-old female, presenting for f/u for DM2, dx in 2001, non-insulin-dependent, , uncontrolled, without long term complications. Last visit 4 months ago.  She had Roux-en-Y gastric bypass surgery in 03/2016.  After this, she lost more than 100 pounds but she gained more than 50 pounds back afterwards.  At last visit, she came off of Trulicity and sugars started to increase to the 200s.  She also started to gain weight and to crave more sweets.  She just restarted it 2 weeks prior to the appointment.  Reviewed HbA1c levels: Lab Results  Component Value Date   HGBA1C 8.1 (A) 12/15/2019   HGBA1C 7.6 (H) 07/12/2019   HGBA1C 6.7 (A) 11/30/2018   HGBA1C 8.5 (A) 07/26/2018   HGBA1C 9.9 (H) 04/15/2018   HGBA1C 7.6 08/03/2017   HGBA1C 6.6 (H) 12/30/2016   HGBA1C 6.1 (H) 10/09/2016   HGBA1C 9.6 02/21/2016   HGBA1C 9.2 10/05/2015   HGBA1C 10.4 05/31/2015   HGBA1C 8.8 01/09/2015   HGBA1C 11.0 (H) 06/21/2014   HGBA1C 8.8 (H) 01/03/2014   HGBA1C 9.6 (H) 04/12/2013   HGBA1C 11.0 (H) 11/18/2012   HGBA1C 9.0 (H) 11/12/2011   HGBA1C 9.6 (H) 06/23/2011   Pt was on a regimen of: - Metformin 2000 mg at dinnertime - Lantus 63 units at bedtime - Victoza 1.8 mg daily - Amaryl 4 mg 2x a day She had frequent yeast inf when sugars were higher before.    Pt is now on a regimen of: - Metformin 500 mg 2x a day >> 1000 mg 2x a day -increased in 03/2018 - Trulicity 0.75 mg weekly-added 07/2018 >> restarted 11/2019 - nausea >> resolved  She was not checking sugars at last visit.  Now checking 0 to once a day: - am:  114-130, 140 >> 105-140 >> 110-130 >> 110-140 - 2h after b'fast: 145-176 >> n/c - before lunch: 82-167 >> 120s >> n/c >> 110-120 >> n/c  - 2h after lunch:  106-166 >> n/c - before dinner: n/c >> 86-110 >> n/c >> 120-150 (yoghurt) >> n/c - 2h after dinner: 83-101 >> 100-120 >> n/c - bedtime: 130-155 >> (candy) 200-225, OTW: 120-150 >> 170-200 - nighttime: n/c Lowest sugar was 34 (after Sx - while still on insulin) >> ...110 >> 110 ; she has hypoglycemia awareness in the 90s. Highest sugar was 330 >> ... 225 >> 225 >> 200.  Glucometer: One Touch Ultra 2 >> FreeStyle Lite   -No CKD, last BUN/creatinine:  Lab Results  Component Value Date   BUN 11 01/13/2020   CREATININE 0.56 01/13/2020  On lisinopril. -+ HL; last set of lipids: Lab Results  Component Value Date   CHOL 158 01/13/2020   HDL 59.50 01/13/2020   LDLCALC 77 01/13/2020   LDLDIRECT 112.0 06/21/2014   TRIG 109.0 01/13/2020   CHOLHDL 3 01/13/2020  On Lipitor 40. -  last eye exam was in 07/2019: No DR.  She had cataracts but is status post cataract surgery.  Dr. Emily Filbert.  -No numbness and tingling in her feet.   On HCTZ for HTN.  ROS: Constitutional: no weight gain/no weight loss, no fatigue, no subjective hyperthermia, no subjective hypothermia Eyes: no blurry vision, no xerophthalmia ENT: no sore throat, no nodules palpated in neck, no dysphagia, no odynophagia, no hoarseness Cardiovascular: no CP/no SOB/no palpitations/no leg swelling Respiratory: no cough/no SOB/no wheezing Gastrointestinal: no N/no V/no D/no C/no acid reflux Musculoskeletal: no muscle aches/no joint aches Skin: no rashes, no hair loss Neurological: no tremors/no numbness/no tingling/no  dizziness  I reviewed pt's medications, allergies, PMH, social hx, family hx, and changes were documented in the history of present illness. Otherwise, unchanged from my initial visit note.  Past Medical History:  Diagnosis Date  . Allergy    seasonal allergies  . Anemia    hx of  . Anxiety    on meds  . Asthma    hx of  . Bipolar disorder (HCC)   . Cataract    bilateral sx  . Chicken pox   . Complication of anesthesia    oxygen saturation dropped after hysterectomy 2009 at [facility]  . Depression    on meds  . Diabetes mellitus    on meds  . Dyspnea   . GERD (gastroesophageal reflux disease)    hx of  . Hyperlipidemia    on meds  . Hypertension    readings  . Hypothyroidism    hx of-LEFT thyroid removed  . Irregular heartbeat    saw dr Myrtis Ser sees -cardiology as needed  . Migraine   . Sleep apnea    uses cpap   Past Surgical History:  Procedure Laterality Date  . ANKLE SURGERY Left 1989  . BACK SURGERY  1999  . CATARACT EXTRACTION, BILATERAL    . CESAREAN SECTION     1995  . LAPAROSCOPIC ASSISTED VAGINAL HYSTERECTOMY  2009     BSO fibroids, DUB, pelvic pain  . 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 SHOULDER ARTHROSCOPY WITH OPEN ROTATOR CUFF REPAIR Right 01/02/2017   Procedure: Right shoulder arthroscopy, A-subcromial decompression, mini open rotator cuff repair, open distal clavicle resection, biceps tenodesis;  Surgeon: Beverely Low, MD;  Location: [facility] OR;  Service: Orthopedics;  Laterality: Right;  . THYROIDECTOMY, PARTIAL Left 2001  . UMBILICAL HERNIA REPAIR  2003  . WISDOM TOOTH EXTRACTION     Social History   Social History  . Marital Status: Married    Spouse Name: N/A  . Number of Children: 1   Occupational History  . Registrar, Administrator    Social History Main Topics  . Smoking status: Never Smoker   . Smokeless tobacco: Never Used  . Alcohol Use: No  . Drug Use: No   Current Outpatient Medications on File Prior to Visit  Medication Sig Dispense Refill  . ARIPiprazole (ABILIFY) 10 MG tablet Take 10 mg by mouth daily.   1  . ASPIRIN PO Take 162 mg by mouth daily.    [name] atorvastatin (LIPITOR) 40 MG tablet TAKE 1 TABLET BY MOUTH DAILY 90 tablet 1  . Biotin 99242 MCG TABS Take 1 tablet by mouth daily.    . Blood Glucose Monitoring Suppl (FREESTYLE LITE) DEVI  Use to check sugar 2 times daily 1 each 0  . Calcium Carbonate-Vit D-Min (CALTRATE 600+D PLUS MINIS PO) Take 1 tablet by mouth 3 (three) times daily.    . carbamazepine (TEGRETOL XR) 200 MG 12 hr tablet 1 tablet in the morning, 2 at night    . clobetasol ointment (TEMOVATE) 0.05 % APPLY 1 APPLICATION TOPICALLY 2 TIMES DAILY. DO NOT USE FOR MORE THAN 7 DAYS 60 g 1  . gabapentin (NEURONTIN) 600 MG tablet Take 600 mg by mouth 3 (three) times daily.   1  . Glucosamine HCl (GLUCOSAMINE PO) Take 3 tablets by mouth daily.    [name] glucose blood (FREESTYLE LITE) test strip Use as instructed to check sugar 2 times daily 200 each 5  . hydrochlorothiazide (MICROZIDE) 12.5 MG capsule TAKE 1 CAPSULE BY MOUTH ONCE DAILY 90 capsule 0  . lamoTRIgine (LAMICTAL) 200 MG tablet Take 200 mg by mouth 2 (two) times daily.    . Lancets (FREESTYLE) lancets Use as instructed to check sugar 2 times daily 200 each 5  . lisinopril (ZESTRIL) 5 MG tablet TAKE 1 TABLET BY MOUTH ONCE DAILY 90 tablet 1  . Loratadine (CLARITIN PO) Take by mouth daily.    . metFORMIN (GLUCOPHAGE) 500 MG tablet TAKE 2 TABLETS BY MOUTH TWICE A DAY WITH A MEAL 360 tablet 2  . Multiple Vitamin (MULTIVITAMIN) tablet Take 1 tablet by mouth daily.    . tiaGABine (GABITRIL) 4 MG tablet Take 8 mg by mouth 2 (two) times daily.    . TRULICITY 0.75 MG/0.5ML SOPN INJECT 0.75 MG UNDER THE SKIN ONCE EVERY MORNING (Patient taking differently:  once a week. ) 2 mL 5  . Turmeric (QC TUMERIC COMPLEX PO) Take by mouth daily.     No current facility-administered medications on file prior to visit.   Allergies  Allergen Reactions  . Zofran [Ondansetron Hcl] Other (See Comments)    Severe headache   Family History  Problem Relation Age of Onset  . Hyperlipidemia Mother   . Diabetes Mother   . Anxiety disorder Mother   . Heart disease Mother   . Hypertension Father   . Lupus Father   . Heart disease Father   . Asthma Father   . Colon polyps Father 60  . Stroke Maternal Aunt   . Stroke Paternal Grandfather   . Mental illness Paternal Grandfather   . Breast cancer Neg Hx   . Colon cancer Neg Hx   . Esophageal cancer Neg Hx   . Stomach cancer Neg Hx   . Rectal cancer Neg Hx    PE: LMP 01/25/2008  Wt Readings from Last 3 Encounters:  04/12/20 285 lb 9.6 oz (129.5 kg)  04/12/20 286 lb (129.7 kg)  01/17/20 284 lb (128.8 kg)   Constitutional:  in NAD  The physical exam was not performed (virtual visit).  ASSESSMENT: 1. DM2, insulin-independent, uncontrolled, without long-term complications  2. Obesity class III  3. HL  PLAN:  1. Patient with longstanding, previously uncontrolled diabetes, complex antidiabetic regimen, initially altered sense except Metformin after gastric bypass surgery in 2017, and losing more than 100 pounds.  However, she started to gain back weight afterwards and HbA1c increased to 9.9%.  At that time, we increased Metformin dose and we added Trulicity.  Before last visit she came off Trulicity but she restarted it 2 weeks prior to the appointment.  At that time, we discussed about terminating candidate and gave her ideas about healthier snacks.  Her  sugars were in the 200s at last visit.  She noticed that she started to feel poorly and craving sweets and also gaining weight when she was off Trulicity and she decided to continue to take this.  She did not want to increase the dose at that point, but  we discussed that if the sugars did not improve, to let me know so I can increase her dose to 1.5 mg weekly.  Latest HbA1c was 8.1%, higher, 4 months ago. - at today's visit, her sugars are at or close to goal in the morning but they are slightly above goal after dinner, up to 200.  We discussed about targets for before and after meal blood sugars and we decided to increase Trulicity to 1.5 mg weekly to reach this targets.  Of note, she is tolerating the lower dose of Trulicity very well.  We can continue the Metformin at the same dose for now. - I advised her to:  Patient Instructions  Please continue: - Metformin 1000 mg 2x a day  Please increase: - Trulicity 1.5 mg weekly  Please come back for a follow-up appointment in 4 months.  - we we will recheck her HbA1c when he returns to the - advised to check sugars at different times of the day - 1x a day, rotating check times - advised for yearly eye exams >> she is UTD - return to clinic in 4 months     2. Obesity class III -She had a Roux-en-Y gastric bypass surgery, after which she lost a significant amount of weight but she started to gain weight back afterwards -At previous visits, we discussed about the calorie density of foods and how to stay with lower calorie density foods.  Given references. -She restarted the GLP-1 receptor agonist 2 weeks prior to our last visit, she previously came off - she gained approximately 21 pounds in the 8 months prior to our last visit.  Weight approximately stable afterwards, including since last OV.  -She started to go to the gym over the summer but she is moving and stopped going to the gym 3 weeks ago >> will restart.  3. HL -Reviewed latest lipid panel from 06/2019: All fractions at goal: Lab Results  Component Value Date   CHOL 158 01/13/2020   HDL 59.50 01/13/2020   LDLCALC 77 01/13/2020   LDLDIRECT 112.0 06/21/2014   TRIG 109.0 01/13/2020   CHOLHDL 3 01/13/2020  -Continues Lipitor 40  without side effects  Carlus Pavlovristina Addisen Chappelle, MD PhD [facility]

## 2020-04-26 ENCOUNTER — Encounter: Payer: Self-pay | Admitting: Gastroenterology

## 2020-04-26 ENCOUNTER — Ambulatory Visit (AMBULATORY_SURGERY_CENTER): Payer: 59 | Admitting: Gastroenterology

## 2020-04-26 ENCOUNTER — Other Ambulatory Visit: Payer: Self-pay

## 2020-04-26 VITALS — BP 156/86 | HR 65 | Temp 96.9°F | Resp 18 | Ht 69.0 in | Wt 286.0 lb

## 2020-04-26 DIAGNOSIS — Z1211 Encounter for screening for malignant neoplasm of colon: Secondary | ICD-10-CM

## 2020-04-26 MED ORDER — SODIUM CHLORIDE 0.9 % IV SOLN: 500.0000 mL | Freq: Once | INTRAVENOUS | Status: DC

## 2020-04-26 NOTE — Progress Notes (Signed)
VS- [name]  Pt's BP is high.  She didn't take her BP medications today and states she is nervous.  Ermalene Postin CRNA made aware and ok to proceed.  Pt's states no medical or surgical changes since previsit or office visit.

## 2020-04-26 NOTE — Patient Instructions (Signed)
YOU HAD AN ENDOSCOPIC PROCEDURE TODAY AT THE [facility]:   Refer to the procedure report that was given to you for any specific questions about what was found during the examination.  If the procedure report does not answer your questions, please call your gastroenterologist to clarify.  If you requested that your care partner not be given the details of your procedure findings, then the procedure report has been included in a sealed envelope for you to review at your convenience later.  YOU SHOULD EXPECT: Some feelings of bloating in the abdomen. Passage of more gas than usual.  Walking can help get rid of the air that was put into your GI tract during the procedure and reduce the bloating. If you had a lower endoscopy (such as a colonoscopy or flexible sigmoidoscopy) you may notice spotting of blood in your stool or on the toilet paper. If you underwent a bowel prep for your procedure, you may not have a normal bowel movement for a few days.  Please Note:  You might notice some irritation and congestion in your nose or some drainage.  This is from the oxygen used during your procedure.  There is no need for concern and it should clear up in a day or so.  SYMPTOMS TO REPORT IMMEDIATELY:   Following lower endoscopy (colonoscopy or flexible sigmoidoscopy):  Excessive amounts of blood in the stool  Significant tenderness or worsening of abdominal pains  Swelling of the abdomen that is new, acute  Fever of 100F or higher   For urgent or emergent issues, a gastroenterologist can be reached at any hour by calling [phone]. Do not use MyChart messaging for urgent concerns.    DIET:  We do recommend a small meal at first, but then you may proceed to your regular diet.  Drink plenty of fluids but you should avoid alcoholic beverages for 24 hours.  MEDICATIONS: Continue present medications.  Please see handouts given to you by your recovery nurse.  ACTIVITY:  You should plan to  take it easy for the rest of today and you should NOT DRIVE or use heavy machinery until tomorrow (because of the sedation medicines used during the test).    FOLLOW UP: Our staff will call the number listed on your records 48-72 hours following your procedure to check on you and address any questions or concerns that you may have regarding the information given to you following your procedure. If we do not reach you, we will leave a message.  We will attempt to reach you two times.  During this call, we will ask if you have developed any symptoms of COVID 19. If you develop any symptoms (ie: fever, flu-like symptoms, shortness of breath, cough etc.) before then, please call [phone].  If you test positive for Covid 19 in the 2 weeks post procedure, please call and report this information to us.    If any biopsies were taken you will be contacted by phone or by letter within the next 1-3 weeks.  Please call us at [phone] if you have not heard about the biopsies in 3 weeks.   Thank you for allowing us to provide for your healthcare needs today.   SIGNATURES/CONFIDENTIALITY: You and/or your care partner have signed paperwork which will be entered into your electronic medical record.  These signatures attest to the fact that that the information above on your After Visit Summary has been reviewed and is understood.  Full responsibility of the   confidentiality of this discharge information lies with you and/or your care-partner. 

## 2020-04-26 NOTE — Progress Notes (Signed)
To PACU, VSS. Report to [email or web address] 

## 2020-04-26 NOTE — Op Note (Signed)
[facility] Patient Name: [name] Procedure Date: 04/26/2020 8:56 AM MRN: [id number] Endoscopist: Napoleon Form , MD Age: 49 Referring MD:  Date of Birth: December 06, 1970 Gender: Female Account #: [id number] Procedure:                Colonoscopy Indications:              Screening for colorectal malignant neoplasm Medicines:                Monitored Anesthesia Care Procedure:                Pre-Anesthesia Assessment:                           - Prior to the procedure, a History and Physical                            was performed, and patient medications and                            allergies were reviewed. The patient's tolerance of                            previous anesthesia was also reviewed. The risks                            and benefits of the procedure and the sedation                            options and risks were discussed with the patient.                            All questions were answered, and informed consent                            was obtained. Prior Anticoagulants: The patient has                            taken no previous anticoagulant or antiplatelet                            agents. ASA Grade Assessment: III - A patient with                            severe systemic disease. After reviewing the risks                            and benefits, the patient was deemed in                            satisfactory condition to undergo the procedure.                           After obtaining informed consent, the colonoscope  was passed under direct vision. Throughout the                            procedure, the patient's blood pressure, pulse, and                            oxygen saturations were monitored continuously. The                            Colonoscope was introduced through the anus and                            advanced to the the cecum, identified by                            appendiceal orifice  and ileocecal valve. The                            colonoscopy was performed without difficulty. The                            patient tolerated the procedure well. The quality                            of the bowel preparation was excellent. The                            ileocecal valve, appendiceal orifice, and rectum                            were photographed. Scope In: 9:15:30 AM Scope Out: 9:36:07 AM Scope Withdrawal Time: 0 hours 14 minutes 38 seconds  Total Procedure Duration: 0 hours 20 minutes 37 seconds  Findings:                 The perianal and digital rectal examinations were                            normal.                           Scattered small and large-mouthed diverticula were                            found in the sigmoid colon and descending colon.                           Non-bleeding external and internal hemorrhoids were                            found during retroflexion. The hemorrhoids were                            small.  The exam was otherwise without abnormality. Complications:            No immediate complications. Estimated Blood Loss:     Estimated blood loss was minimal. Impression:               - Mild diverticulosis in the sigmoid colon and in                            the descending colon.                           - Non-bleeding external and internal hemorrhoids.                           - The examination was otherwise normal.                           - No specimens collected. Recommendation:           - Patient has a contact number available for                            emergencies. The signs and symptoms of potential                            delayed complications were discussed with the                            patient. Return to normal activities tomorrow.                            Written discharge instructions were provided to the                            patient.                           -  Resume previous diet.                           - Continue present medications.                           - Repeat colonoscopy in 10 years for screening                            purposes. Napoleon Form, MD 04/26/2020 9:41:38 AM This report has been signed electronically.

## 2020-04-27 ENCOUNTER — Other Ambulatory Visit: Payer: Self-pay | Admitting: Family Medicine

## 2020-04-27 MED FILL — tiaGABine HCL 4 MG TABS: 4 | 90 days supply | Qty: 360 | Fill #0

## 2020-04-27 MED FILL — LISINOPRIL 5 MG TABLET: 5 | 90 days supply | Qty: 90 | Fill #1

## 2020-04-27 MED FILL — HYDROCHLOROTHIAZIDE 12.5 MG: 12.5 | 90 days supply | Qty: 90 | Fill #0

## 2020-04-27 MED FILL — lamoTRIgine 200 MG TABS: 200 | 90 days supply | Qty: 180 | Fill #0

## 2020-04-27 MED FILL — ARIPIPRAZOLE 10 MG TABS: 10 | 90 days supply | Qty: 90 | Fill #0

## 2020-04-30 ENCOUNTER — Telehealth: Payer: Self-pay | Admitting: *Deleted

## 2020-04-30 ENCOUNTER — Telehealth: Payer: Self-pay

## 2020-04-30 NOTE — Telephone Encounter (Signed)
Left message on follow up call. 

## 2020-04-30 NOTE — Telephone Encounter (Signed)
  Follow up Call-  Call back number 04/26/2020  Post procedure Call Back phone  # [phone]  Permission to leave phone message Yes  Some recent data might be hidden     No answer at 2nd attempt follow up phone call.  Left message on voicemail.

## 2020-05-01 ENCOUNTER — Other Ambulatory Visit (HOSPITAL_BASED_OUTPATIENT_CLINIC_OR_DEPARTMENT_OTHER): Payer: Self-pay | Admitting: Nurse Practitioner

## 2020-05-01 DIAGNOSIS — F411 Generalized anxiety disorder: Secondary | ICD-10-CM | POA: Diagnosis not present

## 2020-05-01 DIAGNOSIS — F3132 Bipolar disorder, current episode depressed, moderate: Secondary | ICD-10-CM | POA: Diagnosis not present

## 2020-05-01 MED FILL — TEGRETOL XR 200 MG TABLET: 200 | 90 days supply | Qty: 270 | Fill #0

## 2020-05-01 MED FILL — GABAPENTIN 600 MG TABLET: 600 | 90 days supply | Qty: 270 | Fill #0

## 2020-06-01 MED FILL — TRULICITY 1.5 MG/0.5 ML PEN: 1.5 | 28 days supply | Qty: 2 | Fill #0

## 2020-06-13 MED FILL — METFORMIN HCL 500 MG TABS: 500 | 90 days supply | Qty: 360 | Fill #2

## 2020-06-25 MED FILL — FREESTYLE LITE TEST STRIP: 50 days supply | Qty: 100 | Fill #1

## 2020-06-25 MED FILL — ATORVASTATIN 40 MG TABLET: 40 | 90 days supply | Qty: 90 | Fill #1

## 2020-07-13 MED FILL — TRULICITY 1.5 MG/0.5 ML PEN: 1.5 | 28 days supply | Qty: 2 | Fill #1

## 2020-07-19 ENCOUNTER — Ambulatory Visit: Payer: 59 | Admitting: Family Medicine

## 2020-07-19 ENCOUNTER — Other Ambulatory Visit: Payer: Self-pay

## 2020-07-19 ENCOUNTER — Encounter: Payer: Self-pay | Admitting: Family Medicine

## 2020-07-19 VITALS — BP 130/88 | HR 59 | Temp 97.5°F | Resp 20 | Ht 69.0 in | Wt 282.8 lb

## 2020-07-19 DIAGNOSIS — I10 Essential (primary) hypertension: Secondary | ICD-10-CM | POA: Diagnosis not present

## 2020-07-19 DIAGNOSIS — F3178 Bipolar disorder, in full remission, most recent episode mixed: Secondary | ICD-10-CM

## 2020-07-19 DIAGNOSIS — E782 Mixed hyperlipidemia: Secondary | ICD-10-CM

## 2020-07-19 NOTE — Patient Instructions (Signed)
Schedule your complete physical in 6 months We'll notify you of your lab results and make any changes if needed Continue to work on healthy diet and regular exercise- you can do it! I'm SO proud of the changes you are making!!! Call with any questions or concerns Stay Safe!  Stay Healthy!!

## 2020-07-20 LAB — CBC WITH DIFFERENTIAL/PLATELET
Basophils Absolute: 0.1 10*3/uL (ref 0.0–0.1)
Basophils Relative: 1.1 % (ref 0.0–3.0)
Eosinophils Absolute: 0.5 10*3/uL (ref 0.0–0.7)
Eosinophils Relative: 5.9 % — ABNORMAL HIGH (ref 0.0–5.0)
HCT: 37.9 % (ref 36.0–46.0)
Hemoglobin: 12.5 g/dL (ref 12.0–15.0)
Lymphocytes Relative: 25.8 % (ref 12.0–46.0)
Lymphs Abs: 2.4 10*3/uL (ref 0.7–4.0)
MCHC: 32.9 g/dL (ref 30.0–36.0)
MCV: 87.3 fl (ref 78.0–100.0)
Monocytes Absolute: 0.7 10*3/uL (ref 0.1–1.0)
Monocytes Relative: 7.7 % (ref 3.0–12.0)
Neutro Abs: 5.5 10*3/uL (ref 1.4–7.7)
Neutrophils Relative %: 59.5 % (ref 43.0–77.0)
Platelets: 283 10*3/uL (ref 150.0–400.0)
RBC: 4.34 Mil/uL (ref 3.87–5.11)
RDW: 13.1 % (ref 11.5–15.5)
WBC: 9.3 10*3/uL (ref 4.0–10.5)

## 2020-07-20 LAB — HEPATIC FUNCTION PANEL
ALT: 15 U/L (ref 0–35)
AST: 18 U/L (ref 0–37)
Albumin: 4 g/dL (ref 3.5–5.2)
Alkaline Phosphatase: 85 U/L (ref 39–117)
Bilirubin, Direct: 0 mg/dL (ref 0.0–0.3)
Total Bilirubin: 0.3 mg/dL (ref 0.2–1.2)
Total Protein: 7 g/dL (ref 6.0–8.3)

## 2020-07-20 LAB — LIPID PANEL
Cholesterol: 156 mg/dL (ref 0–200)
HDL: 56.4 mg/dL (ref >39.00)
LDL Cholesterol: 76 mg/dL (ref 0–99)
NonHDL: 99.41
Total CHOL/HDL Ratio: 3
Triglycerides: 116 mg/dL (ref 0.0–149.0)
VLDL: 23.2 mg/dL (ref 0.0–40.0)

## 2020-07-20 LAB — BASIC METABOLIC PANEL
BUN: 13 mg/dL (ref 6–23)
CO2: 29 mEq/L (ref 19–32)
Calcium: 9.1 mg/dL (ref 8.4–10.5)
Chloride: 95 mEq/L — ABNORMAL LOW (ref 96–112)
Creatinine, Ser: 0.58 mg/dL (ref 0.40–1.20)
GFR: 106.82 mL/min (ref >60.00)
Glucose, Bld: 98 mg/dL (ref 70–99)
Potassium: 4 mEq/L (ref 3.5–5.1)
Sodium: 133 mEq/L — ABNORMAL LOW (ref 135–145)

## 2020-07-20 LAB — TSH: TSH: 1.32 u[IU]/mL (ref 0.35–4.50)

## 2020-07-20 NOTE — Progress Notes (Signed)
   Subjective:    Patient ID: [name], female    DOB: 29-Dec-1970, 50 y.o.   MRN: [id number]  HPI HTN- chronic problem, on HCTZ 12.5mg  daily, Lisinopril 5mg  daily w/ good control.  No CP, SOB, HAs, visual changes, edema.   Hyperlipidemia- chronic problem, on lipitor 40mg  daily.  Denies abd pain, N/V.    Obesity- BMI is 41.76 but she is down 3 lbs since December.  'I finally came to my senses the end of January'.  She is working on healthy diet to lower sugar and lose weight.  No regular physical activity.  Wants to start walking again as the weather improves  Bipolar disorder- pt now following w/ January   Review of Systems For ROS see HPI   This visit occurred during the SARS-CoV-2 public health emergency.  Safety protocols were in place, including screening questions prior to the visit, additional usage of staff PPE, and extensive cleaning of exam room while observing appropriate contact time as indicated for disinfecting solutions.       Objective:   Physical Exam AAOx3, NAD Obese Coloring WNL Neck supple, no LAD or thyromegaly RRR, normal S1/S2 Lungs CTAB Pulses +2 and symmetric No LE edema       Assessment & Plan:

## 2020-07-20 NOTE — Assessment & Plan Note (Signed)
Chronic problem.  On Lipitor 40mg daily w/o difficulty.  Check labs.  Adjust meds prn  ?

## 2020-07-20 NOTE — Assessment & Plan Note (Signed)
Ongoing issue for pt but she is down 3 lbs since January.  She states she has decided to really work on her diet and restart her walking program.  Applauded this decision and encouraged her to follow through.  Will continue to follow.

## 2020-07-20 NOTE — Assessment & Plan Note (Signed)
Ongoing issue for pt.  Currently well controlled w/ [name]

## 2020-07-20 NOTE — Assessment & Plan Note (Signed)
Chronic problem.  On HCTZ 12.5mg  daily and Lisinopril 5mg  daily w/ adequate control.  Currently asymptomatic.  Pt has decided that she is going to work on , regular exercise, and weight loss and this will also improve her BP.  Check labs.  No anticipated med changes.  Will follow.

## 2020-08-03 ENCOUNTER — Other Ambulatory Visit: Payer: Self-pay | Admitting: Family Medicine

## 2020-08-03 MED FILL — LAMOTRIGINE 200 MG TABS: 200 | 90 days supply | Qty: 180 | Fill #1

## 2020-08-03 MED FILL — ARIPIPRAZOLE 10 MG TABS: 10 | 90 days supply | Qty: 90 | Fill #1

## 2020-08-06 ENCOUNTER — Other Ambulatory Visit: Payer: Self-pay | Admitting: Family Medicine

## 2020-08-16 ENCOUNTER — Ambulatory Visit: Payer: 59 | Admitting: Internal Medicine

## 2020-08-16 ENCOUNTER — Encounter: Payer: Self-pay | Admitting: Internal Medicine

## 2020-08-16 ENCOUNTER — Other Ambulatory Visit: Payer: Self-pay

## 2020-08-16 VITALS — BP 138/82 | HR 64 | Ht 68.0 in | Wt 280.8 lb

## 2020-08-16 DIAGNOSIS — E782 Mixed hyperlipidemia: Secondary | ICD-10-CM | POA: Diagnosis not present

## 2020-08-16 DIAGNOSIS — E119 Type 2 diabetes mellitus without complications: Secondary | ICD-10-CM

## 2020-08-16 LAB — POCT GLYCOSYLATED HEMOGLOBIN (HGB A1C): Hemoglobin A1C: 6.6 % — AB (ref 4.0–5.6)

## 2020-08-16 NOTE — Patient Instructions (Addendum)
Please continue: - Metformin 1000 mg 2x a day - Trulicity 1.5 mg weekly  Add healthy fats for b'fast:  - flax seeds (ground) - chia seeds - 1/2 cup unsalted nuts - 1/2 avocado  Please come back for a follow-up appointment in 4 months.

## 2020-08-16 NOTE — Progress Notes (Signed)
Patient ID: [name], female   DOB: 06-01-1970, 50 y.o.   MRN: [id number]  This visit occurred during the SARS-CoV-2 public health emergency.  Safety protocols were in place, including screening questions prior to the visit, additional usage of staff PPE, and extensive cleaning of exam room while observing appropriate contact time as indicated for disinfecting solutions.   HPI: [name] is a 50 y.o.-year-old female, presenting for f/u for DM2, dx in 2001, non-insulin-dependent, uncontrolled, without long term complications. Last visit 4 months ago.  She had Roux-en-Y gastric bypass surgery in 03/2016.  After this, she lost more than 100 pounds but she gained more than 50 pounds back afterwards.  Interim history: Right before last visit, she started to go to the gym.  She continued this now.  We also increase Trulicity at last visit.  She lost 6 pounds since last visit and sugars improved.  She feels much better, with less fatigue compared to before. She stopped going to the gym as [place]  Reviewed HbA1c levels: Lab Results  Component Value Date   HGBA1C 8.1 (A) 12/15/2019   HGBA1C 7.6 (H) 07/12/2019   HGBA1C 6.7 (A) 11/30/2018   HGBA1C 8.5 (A) 07/26/2018   HGBA1C 9.9 (H) 04/15/2018   HGBA1C 7.6 08/03/2017   HGBA1C 6.6 (H) 12/30/2016   HGBA1C 6.1 (H) 10/09/2016   HGBA1C 9.6 02/21/2016   HGBA1C 9.2 10/05/2015   HGBA1C 10.4 05/31/2015   HGBA1C 8.8 01/09/2015   HGBA1C 11.0 (H) 06/21/2014   HGBA1C 8.8 (H) 01/03/2014   HGBA1C 9.6 (H) 04/12/2013   HGBA1C 11.0 (H) 11/18/2012   HGBA1C 9.0 (H) 11/12/2011   HGBA1C 9.6 (H) 06/23/2011   Pt was on a regimen of: - Metformin 2000 mg at dinnertime - Lantus 63 units at bedtime - Victoza 1.8 mg daily - Amaryl 4 mg 2x a day She had frequent yeast inf when sugars were higher before.   Pt is now on a regimen of: - Metformin 500 mg 2x a day >> 1000 mg 2x a day -increased in 03/2018 - Trulicity 0.75  mg weekly-added 07/2018 >> restarted 11/2019 -initially had nausea, then resolved >> 1.5 mg weekly  She is checking sugars 1-3x  a day: - am:  114-130, 140 >> 105-140 >> 110-130 >> 110-140 >> 93-143 - 2h after b'fast: 145-176 >> n/c >> 71-100 - before lunch: 82-167 >> 120s >> n/c >> 110-120 >> n/c >> 90, 93 - 2h after lunch:  106-166 >> n/c >> 84, 192 - before dinner: n/c >> 86-110 >> n/c >> 120-150 (yoghurt) >> n/c >> 95, 99  - 2h after dinner: 83-101 >> 100-120 >> n/c - bedtime: 130-155 >> (candy) 200-225, OTW: 120-150 >> 170-200 >> 118-160 - nighttime: n/c Lowest sugar was 34 (after Sx - while still on insulin) >> ...110 >> 110 >> 93 >> 71; she has hypoglycemia awareness in the 90s. Highest sugar was 330 >> ... 225 >> 225 >> 200 >> 160.  Glucometer: One Touch Ultra 2 >> FreeStyle Lite   -No CKD, last BUN/creatinine:  Lab Results  Component Value Date   BUN 13 07/19/2020   CREATININE 0.58 07/19/2020  On lisinopril. -+ HL; last set of lipids: Lab Results  Component Value Date   CHOL 156 07/19/2020   HDL 56.40 07/19/2020   LDLCALC 76 07/19/2020   LDLDIRECT 112.0 06/21/2014   TRIG 116.0 07/19/2020   CHOLHDL 3 07/19/2020  On Lipitor 40. - last eye  exam was in 07/2019: No DR.  She had cataracts but is status post cataract surgery.  Dr. Emily Filbert.  -No numbness and tingling in her feet.   On HCTZ for HTN.  ROS: Constitutional: no weight gain/+ weight loss, no fatigue, no subjective hyperthermia, no subjective hypothermia Eyes: no blurry vision, no xerophthalmia ENT: no sore throat, no nodules palpated in neck, no dysphagia, no odynophagia, no hoarseness Cardiovascular: no CP/no SOB/no palpitations/no leg swelling Respiratory: no cough/no SOB/no wheezing Gastrointestinal: no N/no V/no D/no C/no acid reflux Musculoskeletal: no muscle aches/no joint aches Skin: no rashes, no hair loss Neurological: no tremors/no numbness/no tingling/no dizziness  I reviewed pt's medications,  allergies, PMH, social hx, family hx, and changes were documented in the history of present illness. Otherwise, unchanged from my initial visit note.  Past Medical History:  Diagnosis Date  . Allergy    seasonal allergies  . Anemia    hx of  . Anxiety    on meds  . Asthma    hx of  . Bipolar disorder (HCC)   . Cataract    bilateral sx  . Chicken pox   . Complication of anesthesia    oxygen saturation dropped after hysterectomy 2009 at [facility]  . Depression    on meds  . Diabetes mellitus    on meds  . Dyspnea   . GERD (gastroesophageal reflux disease)    hx of  . Hyperlipidemia    on meds  . Hypertension    readings  . Hypothyroidism    hx of-LEFT thyroid removed  . Irregular heartbeat    saw dr Myrtis Ser sees -cardiology as needed  . Migraine   . Sleep apnea    uses cpap   Past Surgical History:  Procedure Laterality Date  . ANKLE SURGERY Left 1989  . BACK SURGERY  1999  . CATARACT EXTRACTION, BILATERAL    . CESAREAN SECTION     1995  . LAPAROSCOPIC ASSISTED VAGINAL HYSTERECTOMY  2009     BSO fibroids, DUB, pelvic pain  . 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 SHOULDER ARTHROSCOPY WITH OPEN ROTATOR CUFF REPAIR Right 01/02/2017   Procedure: Right shoulder arthroscopy, A-subcromial decompression, mini open rotator cuff repair, open distal clavicle resection, biceps tenodesis;  Surgeon: Beverely Low, MD;  Location: [facility] OR;  Service: Orthopedics;  Laterality: Right;  . THYROIDECTOMY, PARTIAL Left 2001  . UMBILICAL HERNIA REPAIR  2003  . WISDOM TOOTH EXTRACTION     Social History   Social History  . Marital Status: Married    Spouse Name: N/A  . Number of Children: 1   Occupational History  . Registrar, Administrator   Social History Main Topics  . Smoking  status: Never Smoker   . Smokeless tobacco: Never Used  . Alcohol Use: No  . Drug Use: No   Current Outpatient Medications on File Prior to Visit  Medication Sig Dispense Refill  . ARIPiprazole (ABILIFY) 10 MG tablet Take 10 mg by mouth daily.   1  . ASPIRIN PO Take 162 mg by mouth daily.    [name] atorvastatin (LIPITOR) 40 MG tablet TAKE 1 TABLET BY MOUTH DAILY 90 tablet 1  . Biotin 53299 MCG TABS Take 1 tablet by mouth daily.    . Blood Glucose Monitoring Suppl (FREESTYLE LITE) DEVI Use to  check sugar 2 times daily 1 each 0  . Calcium Carbonate-Vit D-Min (CALTRATE 600+D PLUS MINIS PO) Take 1 tablet by mouth 3 (three) times daily.    . carbamazepine (TEGRETOL XR) 200 MG 12 hr tablet 1 tablet in the morning, 2 at night    . clobetasol ointment (TEMOVATE) 0.05 % APPLY 1 APPLICATION TOPICALLY 2 TIMES DAILY. DO NOT USE FOR MORE THAN 7 DAYS 60 g 1  . Dulaglutide (TRULICITY) 1.5 MG/0.5ML SOPN Inject 1.5 mg into the skin once a week. 6 mL 3  . gabapentin (NEURONTIN) 600 MG tablet Take 600 mg by mouth 3 (three) times daily.   1  . Glucosamine HCl (GLUCOSAMINE PO) Take 3 tablets by mouth daily.    [name] glucose blood (FREESTYLE LITE) test strip Use as instructed to check sugar 2 times daily 200 each 5  . hydrochlorothiazide (MICROZIDE) 12.5 MG capsule TAKE 1 CAPSULE BY MOUTH ONCE DAILY 90 capsule 0  . lamoTRIgine (LAMICTAL) 200 MG tablet Take 200 mg by mouth 2 (two) times daily.    . Lancets (FREESTYLE) lancets Use as instructed to check sugar 2 times daily 200 each 5  . lisinopril (ZESTRIL) 5 MG tablet TAKE 1 TABLET BY MOUTH ONCE DAILY 90 tablet 1  . Loratadine (CLARITIN PO) Take by mouth daily.    . metFORMIN (GLUCOPHAGE) 500 MG tablet TAKE 2 TABLETS BY MOUTH TWICE A DAY WITH A MEAL 360 tablet 2  . Multiple Vitamin (MULTIVITAMIN) tablet Take 1 tablet by mouth daily.    . tiaGABine (GABITRIL) 4 MG tablet Take 8 mg by mouth 2 (two) times daily.    . Turmeric (QC TUMERIC COMPLEX PO) Take by mouth daily.      Current Facility-Administered Medications on File Prior to Visit  Medication Dose Route Frequency Provider Last Rate Last Admin  . 0.9 %  sodium chloride infusion  500 mL Intravenous Once Nandigam, Eleonore Chiquito, MD       Allergies  Allergen Reactions  . Zofran [Ondansetron Hcl] Other (See Comments)    Severe headache   Family History  Problem Relation Age of Onset  . Hyperlipidemia Mother   . Diabetes Mother   . Anxiety disorder Mother   . Heart disease Mother   . Hypertension Father   . Lupus Father   . Heart disease Father   . Asthma Father   . Colon polyps Father 50  . Stroke Maternal Aunt   . Stroke Paternal Grandfather   . Mental illness Paternal Grandfather   . Breast cancer Neg Hx   . Colon cancer Neg Hx   . Esophageal cancer Neg Hx   . Stomach cancer Neg Hx   . Rectal cancer Neg Hx    PE: BP 138/82 (BP Location: Right Arm, Patient Position: Sitting, Cuff Size: Large)   Pulse 64   Ht 5\' 8"  (1.727 m)   Wt 280 lb 12.8 oz (127.4 kg)   LMP 01/25/2008   SpO2 95%   BMI 42.70 kg/m  Wt Readings from Last 3 Encounters:  08/16/20 280 lb 12.8 oz (127.4 kg)  07/19/20 282 lb 12.8 oz (128.3 kg)  04/26/20 286 lb (129.7 kg)   Constitutional: overweight, in NAD Eyes: PERRLA, EOMI, no exophthalmos ENT: moist mucous membranes, no thyromegaly, no cervical lymphadenopathy Cardiovascular: RRR, No MRG Respiratory: CTA B Gastrointestinal: abdomen soft, NT, ND, BS+ Musculoskeletal: no deformities, strength intact in all 4 Skin: moist, warm, no rashes Neurological: no tremor with outstretched hands, DTR normal in all 4  ASSESSMENT: 1. DM2, insulin-independent, uncontrolled, without long-term complications  2. Obesity class III  3. HL  PLAN:  1. Patient with longstanding, previously uncontrolled diabetes, on complex antidiabetic regimen in the past, but improvement of control after her gastric bypass surgery in 2017 and losing more than 100 pounds.  However, afterwards, she  started to gain back weight and HbA1c increased to 9.9%.  At that time, we increased Metformin and we added Trulicity.  At last visit, we increase the Trulicity dose as her sugars were at goal in the morning but increasing and up to 200s after dinner.  HbA1c was high, at 8.1%. -At today's visit, sugars are much better, most at goal with few hyperglycemic exceptions in the morning usually after dietary indiscretions.  She does describe that she feels tired2 hours after breakfast, when sugars are between 70 and 100.  We discussed that this is most likely due to the carbs absorbed during breakfast and I advised her to add healthy fats to maintain a more stable blood sugar profile afterwards.  If sugars do not improve, we may need to back off Trulicity. - I advised her to:  Patient Instructions  Please continue: - Metformin 1000 mg 2x a day - Trulicity 1.5 mg weekly  Add healthy fats for b'fast:  - flax seeds (ground) - chia seeds - 1/2 cup unsalted nuts - 1/2 avocado  Please come back for a follow-up appointment in 4 months.  - we checked her HbA1c: 6.6% (improved) - advised to check sugars at different times of the day - 1x a day, rotating check times - advised for yearly eye exams >> she is not UTD - return to clinic in 4 months     2. Obesity class III -She had a Roux-en-Y gastric bypass surgery, after which she lost a significant amount of weight but she started to gain weight back afterwards -We will continue the GLP-1 receptor agonist which should also help with weight loss -She restarted going to the gym before last visit.  She did stop 2 months ago when she was too busy moving.  She still lost 6 pounds since last visit.  She is preparing to start walking in the  neighborhood and I encouraged her to also do strength exercises.  3. HL -Lipid panel from last month: All fractions at goal: Lab Results  Component Value Date   CHOL 156 07/19/2020   HDL 56.40 07/19/2020   LDLCALC 76  07/19/2020   LDLDIRECT 112.0 06/21/2014   TRIG 116.0 07/19/2020   CHOLHDL 3 07/19/2020  -Continues Lipitor 40 without side effects  Carlus Pavlov, MD PhD [facility] Endocrinology

## 2020-08-22 DIAGNOSIS — H43811 Vitreous degeneration, right eye: Secondary | ICD-10-CM | POA: Diagnosis not present

## 2020-09-04 DIAGNOSIS — H43811 Vitreous degeneration, right eye: Secondary | ICD-10-CM | POA: Diagnosis not present

## 2020-09-07 ENCOUNTER — Telehealth: Payer: 59 | Admitting: Emergency Medicine

## 2020-09-07 ENCOUNTER — Other Ambulatory Visit: Payer: Self-pay | Admitting: Internal Medicine

## 2020-09-07 ENCOUNTER — Other Ambulatory Visit (HOSPITAL_BASED_OUTPATIENT_CLINIC_OR_DEPARTMENT_OTHER): Payer: Self-pay

## 2020-09-07 DIAGNOSIS — R059 Cough, unspecified: Secondary | ICD-10-CM | POA: Diagnosis not present

## 2020-09-07 MED ORDER — DEXTROMETHORPHAN HBR 15 MG/5ML PO SYRP
10.0000 mL | ORAL_SOLUTION | Freq: Four times a day (QID) | ORAL | 0 refills | Status: DC | PRN
  Filled 2020-09-07: qty 120, 3d supply, fill #0

## 2020-09-07 MED FILL — Carbamazepine Tab ER 12HR 200 MG: ORAL | 90 days supply | Qty: 270 | Fill #0 | Status: AC

## 2020-09-07 MED FILL — Tiagabine HCl Tab 4 MG: ORAL | 90 days supply | Qty: 360 | Fill #0 | Status: AC

## 2020-09-07 NOTE — Progress Notes (Signed)
We are sorry that you are not feeling well.  Here is how we plan to help!  Based on your presentation I believe you most likely have A cough due to a virus.  This is called viral bronchitis and is best treated by rest, plenty of fluids and control of the cough.  You may use Ibuprofen or Tylenol as directed to help your symptoms.     I've called in a prescription cough syrup for you that should help with the cough.  Providers prescribe antibiotics to treat infections caused by bacteria. Antibiotics are very powerful in treating bacterial infections when they are used properly. To maintain their effectiveness, they should be used only when necessary. Overuse of antibiotics has resulted in the development of superbugs that are resistant to treatment!    After careful review of your answers, I would not recommend an antibiotic for your condition.  Antibiotics are not effective against viruses and therefore should not be used to treat them. Common examples of infections caused by viruses include colds and flu  From your responses in the eVisit questionnaire you describe inflammation in the upper respiratory tract which is causing a significant cough.  This is commonly called Bronchitis and has four common causes:    Allergies  Viral Infections  Acid Reflux  Bacterial Infection Allergies, viruses and acid reflux are treated by controlling symptoms or eliminating the cause. An example might be a cough caused by taking certain blood pressure medications. You stop the cough by changing the medication. Another example might be a cough caused by acid reflux. Controlling the reflux helps control the cough.  USE OF BRONCHODILATOR ("RESCUE") INHALERS: There is a risk from using your bronchodilator too frequently.  The risk is that over-reliance on a medication which only relaxes the muscles surrounding the breathing tubes can reduce the effectiveness of medications prescribed to reduce swelling and  congestion of the tubes themselves.  Although you feel brief relief from the bronchodilator inhaler, your asthma may actually be worsening with the tubes becoming more swollen and filled with mucus.  This can delay other crucial treatments, such as oral steroid medications. If you need to use a bronchodilator inhaler daily, several times per day, you should discuss this with your provider.  There are probably better treatments that could be used to keep your asthma under control.     HOME CARE . Only take medications as instructed by your medical team. . Complete the entire course of an antibiotic. . Drink plenty of fluids and get plenty of rest. . Avoid close contacts especially the very young and the elderly . Cover your mouth if you cough or cough into your sleeve. . Always remember to wash your hands . A steam or ultrasonic humidifier can help congestion.   GET HELP RIGHT AWAY IF: . You develop worsening fever. . You become short of breath . You cough up blood. . Your symptoms persist after you have completed your treatment plan MAKE SURE YOU   Understand these instructions.  Will watch your condition.  Will get help right away if you are not doing well or get worse.  Your e-visit answers were reviewed by a board certified advanced clinical practitioner to complete your personal care plan.  Depending on the condition, your plan could have included both over the counter or prescription medications. If there is a problem please reply  once you have received a response from your provider. Your safety is important to [place].  If you  have drug allergies check your prescription carefully.    You can use MyChart to ask questions about today's visit, request a non-urgent call back, or ask for a work or school excuse for 24 hours related to this e-Visit. If it has been greater than 24 hours you will need to follow up with your provider, or enter a new e-Visit to address those concerns. You will  get an e-mail in the next two days asking about your experience.  I hope that your e-visit has been valuable and will speed your recovery. Thank you for using e-visits.  Approximately 5 minutes was used in reviewing the patient's chart, questionnaire, prescribing medications, and documentation.

## 2020-09-08 MED ORDER — METFORMIN HCL 500 MG PO TABS
ORAL_TABLET | ORAL | 2 refills | Status: DC
  Filled 2020-09-08: qty 360, 90d supply, fill #0

## 2020-09-10 ENCOUNTER — Other Ambulatory Visit (HOSPITAL_BASED_OUTPATIENT_CLINIC_OR_DEPARTMENT_OTHER): Payer: Self-pay

## 2020-09-13 DIAGNOSIS — J069 Acute upper respiratory infection, unspecified: Secondary | ICD-10-CM | POA: Diagnosis not present

## 2020-09-23 ENCOUNTER — Telehealth: Payer: 59 | Admitting: Physician Assistant

## 2020-09-23 DIAGNOSIS — R059 Cough, unspecified: Secondary | ICD-10-CM

## 2020-09-23 NOTE — Progress Notes (Signed)
Based on what you shared with me, I feel your condition warrants further evaluation and I recommend that you be seen in a face to face office visit.  Ms. Shimizu Sorry you are not feeling well! Due to how long your symptoms have been going on and since you have recently completed a course of antibiotic without resolution of your illness, I would recommend that you have a face to face visit for further evaluation of your symptoms. This will help ensure amore severe infection, such as pneumonia, has not developed.    NOTE: If you entered your credit card information for this eVisit, you will not be charged. You may see a "hold" on your card for the $35 but that hold will drop off and you will not have a charge processed.   If you are having a true medical emergency please call 911.      For an urgent face to face visit, [place] has six urgent care centers for your convenience:     [facility] at [facility] Directions [phone] [address] [place], [place] [address] . 8 am - 4 pm Monday - Friday    [facility]) Get Driving Directions [phone] [address] [place], [place] [address] . 8 am to 8 pm Monday-Friday . 10 am to 6 pm [facility]gent [facility]) Get Driving Directions [phone]  [address] [place],  [place]  [address] . 8 am to 8 pm Monday-Friday . 8 am to 4 pm [facility] Urgent Care at [facility] Get Driving Directions [phone] [address], [address] [place], [place] [address] . 8 am to 8 pm Monday-Friday . 8 am to 4 pm [facility] Urgent Care at [facility] Get Driving Directions  [phone] [address] [place], [place] [address] . 8 am to 8 pm Monday-Friday . 8 am to 4 pm [facility] Urgent Care at [facility] Directions [phone] [address] [place], [place] [address] . 8 am to 8 pm Monday-Friday . 8 am to 4 pm Saturday-Sunday     Your MyChart E-visit questionnaire answers were reviewed by a board certified advanced clinical practitioner to complete your personal care plan based on your specific symptoms.  Thank you for using e-Visits.    I spent 5-10 minutes on review and completion of this note- [facility]

## 2020-09-24 DIAGNOSIS — J069 Acute upper respiratory infection, unspecified: Secondary | ICD-10-CM | POA: Diagnosis not present

## 2020-09-24 DIAGNOSIS — R0982 Postnasal drip: Secondary | ICD-10-CM | POA: Diagnosis not present

## 2020-09-25 ENCOUNTER — Other Ambulatory Visit (HOSPITAL_BASED_OUTPATIENT_CLINIC_OR_DEPARTMENT_OTHER): Payer: Self-pay

## 2020-09-25 MED ORDER — AZELASTINE HCL 0.1 % NA SOLN
NASAL | 0 refills | Status: DC
  Filled 2020-09-25: qty 30, 50d supply, fill #0

## 2020-09-25 MED FILL — Dulaglutide Soln Auto-injector 1.5 MG/0.5ML: SUBCUTANEOUS | 28 days supply | Qty: 2 | Fill #0 | Status: AC

## 2020-09-27 ENCOUNTER — Ambulatory Visit: Payer: 59

## 2020-10-02 ENCOUNTER — Other Ambulatory Visit: Payer: Self-pay | Admitting: Family Medicine

## 2020-10-02 ENCOUNTER — Other Ambulatory Visit (HOSPITAL_BASED_OUTPATIENT_CLINIC_OR_DEPARTMENT_OTHER): Payer: Self-pay

## 2020-10-02 MED ORDER — ATORVASTATIN CALCIUM 40 MG PO TABS
ORAL_TABLET | Freq: Every day | ORAL | 1 refills | Status: DC
  Filled 2020-10-02: qty 90, 90d supply, fill #0
  Filled 2020-12-31: qty 90, 90d supply, fill #1

## 2020-10-02 MED FILL — Gabapentin Tab 600 MG: ORAL | 90 days supply | Qty: 270 | Fill #0 | Status: AC

## 2020-10-21 DIAGNOSIS — I10 Essential (primary) hypertension: Secondary | ICD-10-CM | POA: Diagnosis not present

## 2020-10-21 DIAGNOSIS — R55 Syncope and collapse: Secondary | ICD-10-CM | POA: Diagnosis not present

## 2020-10-21 DIAGNOSIS — R001 Bradycardia, unspecified: Secondary | ICD-10-CM | POA: Diagnosis not present

## 2020-10-21 DIAGNOSIS — R531 Weakness: Secondary | ICD-10-CM | POA: Diagnosis not present

## 2020-10-21 DIAGNOSIS — E119 Type 2 diabetes mellitus without complications: Secondary | ICD-10-CM | POA: Diagnosis not present

## 2020-10-21 DIAGNOSIS — R0789 Other chest pain: Secondary | ICD-10-CM | POA: Diagnosis not present

## 2020-10-21 DIAGNOSIS — F319 Bipolar disorder, unspecified: Secondary | ICD-10-CM | POA: Diagnosis not present

## 2020-10-21 DIAGNOSIS — R0989 Other specified symptoms and signs involving the circulatory and respiratory systems: Secondary | ICD-10-CM | POA: Diagnosis not present

## 2020-10-27 ENCOUNTER — Telehealth: Payer: 59 | Admitting: Nurse Practitioner

## 2020-10-27 DIAGNOSIS — R059 Cough, unspecified: Secondary | ICD-10-CM

## 2020-10-27 NOTE — Progress Notes (Signed)
We are sorry that you are not feeling well.  Here is how we plan to help!  Based on your presentation I believe you most likely have A cough due to a virus.  This is called viral bronchitis and is best treated by rest, plenty of fluids and control of the cough.  You may use Ibuprofen or Tylenol as directed to help your symptoms.     In addition you may use A prescription cough medication called Tessalon Perles 100mg . You may take 1-2 capsules every 8 hours as needed for your cough.  This cough can continue for several weeks.  From your responses in the eVisit questionnaire you describe inflammation in the upper respiratory tract which is causing a significant cough.  This is commonly called Bronchitis and has four common causes:    Allergies  Viral Infections  Acid Reflux  Bacterial Infection Allergies, viruses and acid reflux are treated by controlling symptoms or eliminating the cause. An example might be a cough caused by taking certain blood pressure medications. You stop the cough by changing the medication. Another example might be a cough caused by acid reflux. Controlling the reflux helps control the cough.  USE OF BRONCHODILATOR ("RESCUE") INHALERS: There is a risk from using your bronchodilator too frequently.  The risk is that over-reliance on a medication which only relaxes the muscles surrounding the breathing tubes can reduce the effectiveness of medications prescribed to reduce swelling and congestion of the tubes themselves.  Although you feel brief relief from the bronchodilator inhaler, your asthma may actually be worsening with the tubes becoming more swollen and filled with mucus.  This can delay other crucial treatments, such as oral steroid medications. If you need to use a bronchodilator inhaler daily, several times per day, you should discuss this with your provider.  There are probably better treatments that could be used to keep your asthma under control.     HOME  CARE . Only take medications as instructed by your medical team. . Complete the entire course of an antibiotic. . Drink plenty of fluids and get plenty of rest. . Avoid close contacts especially the very young and the elderly . Cover your mouth if you cough or cough into your sleeve. . Always remember to wash your hands . A steam or ultrasonic humidifier can help congestion.   GET HELP RIGHT AWAY IF: . You develop worsening fever. . You become short of breath . You cough up blood. . Your symptoms persist after you have completed your treatment plan MAKE SURE YOU   Understand these instructions.  Will watch your condition.  Will get help right away if you are not doing well or get worse.  Your e-visit answers were reviewed by a board certified advanced clinical practitioner to complete your personal care plan.  Depending on the condition, your plan could have included both over the counter or prescription medications. If there is a problem please reply  once you have received a response from your provider. Your safety is important to .  If you have drug allergies check your prescription carefully.    You can use MyChart to ask questions about today's visit, request a non-urgent call back, or ask for a work or school excuse for 24 hours related to this e-Visit. If it has been greater than 24 hours you will need to follow up with your provider, or enter a new e-Visit to address those concerns. You will get an e-mail in the next two  days asking about your experience.  I hope that your e-visit has been valuable and will speed your recovery. Thank you for using e-visits.  5-10 minutes spent reviewing and documenting in chart.

## 2020-10-28 DIAGNOSIS — J22 Unspecified acute lower respiratory infection: Secondary | ICD-10-CM | POA: Diagnosis not present

## 2020-10-28 DIAGNOSIS — U071 COVID-19: Secondary | ICD-10-CM | POA: Diagnosis not present

## 2020-11-01 ENCOUNTER — Other Ambulatory Visit: Payer: Self-pay

## 2020-11-01 MED FILL — Lisinopril Tab 5 MG: ORAL | 90 days supply | Qty: 90 | Fill #0 | Status: AC

## 2020-11-02 ENCOUNTER — Other Ambulatory Visit (HOSPITAL_BASED_OUTPATIENT_CLINIC_OR_DEPARTMENT_OTHER): Payer: Self-pay

## 2020-11-05 ENCOUNTER — Other Ambulatory Visit (HOSPITAL_BASED_OUTPATIENT_CLINIC_OR_DEPARTMENT_OTHER): Payer: Self-pay

## 2020-11-07 ENCOUNTER — Other Ambulatory Visit (HOSPITAL_BASED_OUTPATIENT_CLINIC_OR_DEPARTMENT_OTHER): Payer: Self-pay

## 2020-11-09 ENCOUNTER — Other Ambulatory Visit (HOSPITAL_BASED_OUTPATIENT_CLINIC_OR_DEPARTMENT_OTHER): Payer: Self-pay

## 2020-11-09 ENCOUNTER — Encounter (HOSPITAL_COMMUNITY): Payer: Self-pay | Admitting: *Deleted

## 2020-11-12 ENCOUNTER — Telehealth: Payer: 59 | Admitting: Family

## 2020-11-12 ENCOUNTER — Other Ambulatory Visit (HOSPITAL_BASED_OUTPATIENT_CLINIC_OR_DEPARTMENT_OTHER): Payer: Self-pay

## 2020-11-12 DIAGNOSIS — H6690 Otitis media, unspecified, unspecified ear: Secondary | ICD-10-CM | POA: Diagnosis not present

## 2020-11-12 MED ORDER — AMOXICILLIN-POT CLAVULANATE 875-125 MG PO TABS
1.0000 | ORAL_TABLET | Freq: Two times a day (BID) | ORAL | 0 refills | Status: DC
  Filled 2020-11-12: qty 14, 7d supply, fill #0

## 2020-11-12 NOTE — Progress Notes (Signed)
E Visit for Swimmer's Ear  We are sorry that you are not feeling well. Here is how we plan to help!  Based on what you have shared with me it looks like you have swimmers ear. Swimmer's ear is a redness or swelling, irritation, or infection of your outer ear canal.  These symptoms usually occur within a few days of swimming.  Your ear canal is a tube that goes from the opening of the ear to the eardrum.  When water stays in your ear canal, germs can grow.  This is a painful condition that often happens to children and swimmers of all ages.  It is not contagious and oral antibiotics are not required to treat uncomplicated swimmer's ear.  The usual symptoms include: Itching inside the ear, Redness or a sense of swelling in the ear, Pain when the ear is tugged on when pressure is placed on the ear, Pus draining from the infected ear.  I sent over Augmentin twice a day for seven days  In certain cases swimmer's ear may progress to a more serious bacterial infection of the middle or inner ear.  If you have a fever 102 and up and significantly worsening symptoms, this could indicate a more serious infection moving to the middle/inner and needs face to face evaluation in an office by a provider.  Your symptoms should improve over the next 3 days and should resolve in about 7 days.  HOME CARE:  Wash your hands frequently. Do not place the tip of the bottle on your ear or touch it with your fingers. You can take Acetominophen 650 mg every 4-6 hours as needed for pain.  If pain is severe or moderate, you can apply a heating pad (set on low) or hot water bottle (wrapped in a towel) to outer ear for 20 minutes.  This will also increase drainage. Avoid ear plugs Do not use Q-tips After showers, help the water run out by tilting your head to one side.  GET HELP RIGHT AWAY IF:  Fever is over 102.2 degrees. You develop progressive ear pain or hearing loss. Ear symptoms persist longer than 3 days after  treatment.  MAKE SURE YOU:  Understand these instructions. Will watch your condition. Will get help right away if you are not doing well or get worse.  TO PREVENT SWIMMER'S EAR: Use a bathing cap or custom fitted swim molds to keep your ears dry. Towel off after swimming to dry your ears. Tilt your head or pull your earlobes to allow the water to escape your ear canal. If there is still water in your ears, consider using a hairdryer on the lowest setting.  Thank you for choosing an e-visit. Your e-visit answers were reviewed by a board certified advanced clinical practitioner to complete your personal care plan. Depending upon the condition, your plan could have included both over the counter or prescription medications. Please review your pharmacy choice. Be sure that the pharmacy you have chosen is open so that you can pick up your prescription now.  If there is a problem you may message your provider in MyChart to have the prescription routed to another pharmacy. Your safety is important to [place]. If you have drug allergies check your prescription carefully.  For the next 24 hours, you can use MyChart to ask questions about today's visit, request a non-urgent call back, or ask for a work or school excuse from your e-visit provider. You will get an email in the next two  days asking about your experience. I hope that your e-visit has been valuable and will speed your recovery.

## 2020-11-12 NOTE — Progress Notes (Signed)
Approximately 5 minutes was spent documenting and reviewing patient's chart.   

## 2020-11-14 ENCOUNTER — Other Ambulatory Visit (HOSPITAL_BASED_OUTPATIENT_CLINIC_OR_DEPARTMENT_OTHER): Payer: Self-pay

## 2020-11-14 MED ORDER — ARIPIPRAZOLE 10 MG PO TABS
ORAL_TABLET | Freq: Every morning | ORAL | 1 refills | Status: DC
  Filled 2020-11-14: qty 90, 90d supply, fill #0

## 2020-11-14 MED ORDER — LAMOTRIGINE 200 MG PO TABS
ORAL_TABLET | Freq: Two times a day (BID) | ORAL | 1 refills | Status: DC
  Filled 2020-11-14: qty 180, 90d supply, fill #0

## 2020-11-14 MED ORDER — TEGRETOL-XR 200 MG PO TB12
ORAL_TABLET | ORAL | 1 refills | Status: DC
  Filled 2020-11-14: qty 270, 90d supply, fill #0

## 2020-11-15 ENCOUNTER — Other Ambulatory Visit (HOSPITAL_BASED_OUTPATIENT_CLINIC_OR_DEPARTMENT_OTHER): Payer: Self-pay

## 2020-11-19 ENCOUNTER — Ambulatory Visit: Payer: 59 | Admitting: Family Medicine

## 2020-11-19 ENCOUNTER — Other Ambulatory Visit (HOSPITAL_BASED_OUTPATIENT_CLINIC_OR_DEPARTMENT_OTHER): Payer: Self-pay

## 2020-11-19 ENCOUNTER — Other Ambulatory Visit: Payer: Self-pay

## 2020-11-19 ENCOUNTER — Encounter: Payer: Self-pay | Admitting: Family Medicine

## 2020-11-19 VITALS — BP 125/84 | HR 65 | Temp 98.0°F | Resp 18 | Ht 68.0 in | Wt 279.6 lb

## 2020-11-19 DIAGNOSIS — R12 Heartburn: Secondary | ICD-10-CM | POA: Insufficient documentation

## 2020-11-19 DIAGNOSIS — R55 Syncope and collapse: Secondary | ICD-10-CM

## 2020-11-19 DIAGNOSIS — I10 Essential (primary) hypertension: Secondary | ICD-10-CM | POA: Diagnosis not present

## 2020-11-19 MED ORDER — LISINOPRIL 10 MG PO TABS
10.0000 mg | ORAL_TABLET | Freq: Every day | ORAL | 3 refills | Status: DC
  Filled 2020-11-19: qty 90, 90d supply, fill #0

## 2020-11-19 NOTE — Patient Instructions (Signed)
Follow up in 6-8 weeks to recheck BP INCREASE the Lisinopril to 10mg  daily- 2 of what you have at home, 1 of the new prescription STOP the HCTZ Continue to drink LOTS of water Change positions slowly to allow your body time to adjust Call with any questions or concerns Hang in there!!!

## 2020-11-19 NOTE — Assessment & Plan Note (Signed)
Given 2 syncopal episodes, will stop HCTZ and increase Lisinopril.  Pt to check BP at home and let me know how things are running and we will titrate as needed.  Pt expressed understanding and is in agreement w/ plan.

## 2020-11-19 NOTE — Progress Notes (Signed)
   Subjective:    Patient ID: [name], female    DOB: 1970-06-10, 50 y.o.   MRN: [id number]  HPI Hospital f/u- pt had been out in the hot sun in [place] for >3 hrs at mid day and passed out.  She went to the ER and they felt she was dehydrated and she got IV fluids.  Labs were normal, EKG normal.  Pt reports feeling better after IV fluids.  Had similar episode in [place] in November 2021.  Pt reports that day she was eating and drinking normally.  Was indoors at the time.  Was sitting down when she started to feel real 'woozy'.  Pt reports she stood up to tell husband, 'and hit the floor'.  Did not go for evaluation at that time.  Denies CP, SOB, HAs, visual changes   Review of Systems For ROS see HPI   This visit occurred during the SARS-CoV-2 public health emergency.  Safety protocols were in place, including screening questions prior to the visit, additional usage of staff PPE, and extensive cleaning of exam room while observing appropriate contact time as indicated for disinfecting solutions.      Objective:   Physical Exam Vitals reviewed.  Constitutional:      General: She is not in acute distress.    Appearance: She is well-developed. She is obese.  HENT:     Head: Normocephalic and atraumatic.  Eyes:     Conjunctiva/sclera: Conjunctivae normal.     Pupils: Pupils are equal, round, and reactive to light.  Neck:     Thyroid: No thyromegaly.  Cardiovascular:     Rate and Rhythm: Normal rate and regular rhythm.     Pulses: Normal pulses.     Heart sounds: Normal heart sounds. No murmur heard. Pulmonary:     Effort: Pulmonary effort is normal. No respiratory distress.     Breath sounds: Normal breath sounds.  Abdominal:     General: There is no distension.     Palpations: Abdomen is soft.     Tenderness: There is no abdominal tenderness.  Musculoskeletal:     Cervical back: Normal range of motion and neck supple.     Right lower leg: No edema.     Left lower  leg: No edema.  Lymphadenopathy:     Cervical: No cervical adenopathy.  Skin:    General: Skin is warm and dry.  Neurological:     General: No focal deficit present.     Mental Status: She is alert and oriented to person, place, and time.     Cranial Nerves: No cranial nerve deficit.     Motor: No weakness.     Gait: Gait normal.  Psychiatric:        Mood and Affect: Mood normal.        Behavior: Behavior normal.          Assessment & Plan:   Syncope- new.  Pt reports this is actually her 2nd episode in the last year.  Both times she has been out of town.  Suspect both times she has not been adequately hydrated.  Will stop HCTZ and encouraged her to push fluids.  Labs from [place] were WNL w/ exception of mildly low Na at 131- likely indicative of mild dehydration.  Pt expressed understanding and is in agreement w/ plan.

## 2020-11-21 ENCOUNTER — Encounter: Payer: Self-pay | Admitting: *Deleted

## 2020-11-28 DIAGNOSIS — H524 Presbyopia: Secondary | ICD-10-CM | POA: Diagnosis not present

## 2020-11-28 DIAGNOSIS — E113211 Type 2 diabetes mellitus with mild nonproliferative diabetic retinopathy with macular edema, right eye: Secondary | ICD-10-CM | POA: Diagnosis not present

## 2020-11-28 LAB — HM DIABETES EYE EXAM

## 2020-11-29 ENCOUNTER — Ambulatory Visit: Payer: 59 | Admitting: Medical

## 2020-11-30 ENCOUNTER — Other Ambulatory Visit (HOSPITAL_BASED_OUTPATIENT_CLINIC_OR_DEPARTMENT_OTHER): Payer: Self-pay

## 2020-12-03 ENCOUNTER — Other Ambulatory Visit (HOSPITAL_BASED_OUTPATIENT_CLINIC_OR_DEPARTMENT_OTHER): Payer: Self-pay

## 2020-12-03 ENCOUNTER — Ambulatory Visit: Payer: 59 | Admitting: Physician Assistant

## 2020-12-03 ENCOUNTER — Other Ambulatory Visit: Payer: Self-pay

## 2020-12-03 ENCOUNTER — Encounter: Payer: Self-pay | Admitting: Physician Assistant

## 2020-12-03 VITALS — Ht 68.0 in | Wt 283.0 lb

## 2020-12-03 DIAGNOSIS — R55 Syncope and collapse: Secondary | ICD-10-CM | POA: Diagnosis not present

## 2020-12-03 DIAGNOSIS — I10 Essential (primary) hypertension: Secondary | ICD-10-CM

## 2020-12-03 DIAGNOSIS — E785 Hyperlipidemia, unspecified: Secondary | ICD-10-CM | POA: Diagnosis not present

## 2020-12-03 DIAGNOSIS — E119 Type 2 diabetes mellitus without complications: Secondary | ICD-10-CM | POA: Diagnosis not present

## 2020-12-03 DIAGNOSIS — R002 Palpitations: Secondary | ICD-10-CM | POA: Diagnosis not present

## 2020-12-03 NOTE — Progress Notes (Signed)
Cardiology Office Note:    Date:  12/05/2020   ID:  [name], DOB 16-Nov-1970, MRN [id number]  PCP:  Crystal Hatch, MD   [facility] Providers Cardiologist:  Crystal Harps, MD     Referring MD: Crystal Hatch, MD   Chief Complaint  Patient presents with   Follow-up    Seen for Dr. Flora [name]     History of Present Illness:    [name] is a 50 y.o. female with a hx of HTN, HLD, DM II and palpitation.  Her monitor demonstrated brief tachycardic episode less than 3 seconds but did not explain her symptoms.  She was mostly having symptom while in normal sinus rhythm.  Her symptom occurs about 2-3 times per week and when they do occur last about 5 to 10 minutes each time.  Her symptom also increased with caffeinated beverages.  She reported family history of atrial fibrillation on her father side.  She does have apple watch, Dr. Flora [name] recommended monitor for irregular rhythm based on her apple watch.  Her last visit with Dr. Flora [name] was on 04/12/2020 at which time she continued to have occasional palpitation, however significantly decreased after cutting back on caffeinated beverages.  Patient was seen recently by Dr. [name], her PCP for syncope.  This occurred patient was out in the hospital in [place] for more than 3 hours at midday near the end of May.  She went to the ED where she was treated for dehydration with IV fluid.  Lab work were normal.  EKG was normal.  She felt better after IV fluid.  Similar episode occurred in [place] in November 2021.  HCTZ was discontinued to avoid future dehydration.  Lisinopril was increased.  Patient presents today for follow-up.  Since leaving [place], she has not had any more dizziness or feeling of passing out.  Orthostatic vital signs was normal today.  Blood work obtained in [place] showed a borderline elevated white blood cell count.  She says she was tested positive for COVID the next day along with her  husband.  She had a very mild course of COVID-19 with symptom only lasted about 3 days.  I still suspect her syncopal episode is more related to dehydration.  I recommend adequate hydration with fluid intake between 32 to 64 ounces per day.  If she continues to have dizziness in the future, I will write a 30-day heart monitor.  Otherwise I have reviewed the previous 7-day heart monitor from September of last year that did not show any significant arrhythmia.  Today's blood pressure is borderline high in the 130s to 140s range.  She has self increase the lisinopril to 20 mg last Thursday.  She will need to continue on this dose for another 2 weeks.  If systolic blood pressure still peaking into the 140s after 2 weeks, I would recommend increase lisinopril to 40 mg daily.  She should have a repeat blood work when she sees her PCP next time to make sure potassium level stable on the higher dose of lisinopril.  Otherwise she can follow-up with Dr. Flora [name] in November.   Past Medical History:  Diagnosis Date   Allergy    seasonal allergies   Anemia    hx of   Anxiety    on meds   Asthma    hx of   Bipolar disorder (HCC)    Cataract    bilateral sx   Chicken pox    Complication  of anesthesia    oxygen saturation dropped after hysterectomy 2009 at [facility]   Depression    on meds   Diabetes mellitus    on meds   Dyspnea    GERD (gastroesophageal reflux disease)    hx of   Hyperlipidemia    on meds   Hypertension    readings   Hypothyroidism    hx of-LEFT thyroid removed   Irregular heartbeat    saw dr Myrtis Ser sees -cardiology as needed   Migraine    Sleep apnea    uses cpap    Past Surgical History:  Procedure Laterality Date   ANKLE SURGERY Left 1989   BACK SURGERY  1999   CATARACT EXTRACTION, BILATERAL     CESAREAN SECTION     1995   LAPAROSCOPIC ASSISTED VAGINAL HYSTERECTOMY  2009     BSO fibroids, DUB, pelvic pain   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 REPAIR  N/A 04/14/2016   Procedure: LAPAROSCOPIC ROUX-EN-Y GASTRIC BYPASS  WITH UPPER ENDOSCOPY;  Surgeon: Glenna Fellows, MD;  Location: [facility] ORS;  Service: General;  Laterality: N/A;   SHOULDER ARTHROSCOPY WITH OPEN ROTATOR CUFF REPAIR Right 01/02/2017   Procedure: Right shoulder arthroscopy, A-subcromial decompression, mini open rotator cuff repair, open distal clavicle resection, biceps tenodesis;  Surgeon: Crystal Low, MD;  Location: [facility] OR;  Service: Orthopedics;  Laterality: Right;   THYROIDECTOMY, PARTIAL Left 2001   UMBILICAL HERNIA REPAIR  2003   WISDOM TOOTH EXTRACTION      Current Medications: Current Meds  Medication Sig   ARIPiprazole (ABILIFY) 10 MG tablet Take 10 mg by mouth daily.    ASPIRIN PO Take 162 mg by mouth daily.   atorvastatin (LIPITOR) 40 MG tablet TAKE 1 TABLET BY MOUTH DAILY   Biotin 51884 MCG TABS Take 1 tablet by mouth daily.   Blood Glucose Monitoring Suppl (FREESTYLE LITE) DEVI Use to check sugar 2 times daily   Calcium Carbonate-Vit D-Min (CALTRATE 600+D PLUS MINIS PO) Take 1 tablet by mouth 3 (three) times daily.   carbamazepine (TEGRETOL XR) 200 MG 12 hr tablet 1 tablet in the morning, 2 at night   clobetasol ointment (TEMOVATE) 0.05 % APPLY 1 APPLICATION TOPICALLY 2 TIMES DAILY. DO NOT USE FOR MORE THAN 7 DAYS   Dulaglutide 1.5 MG/0.5ML SOPN INJECT 1.5MG  INTO THE SKIN ONCE A WEEK   gabapentin (NEURONTIN) 600 MG tablet TAKE 1 TABLET BY MOUTH THREE TIMES DAILY   Glucosamine HCl (GLUCOSAMINE PO) Take 3 tablets by mouth daily.   glucose blood test strip USE AS DIRECTED TO CHECK BLOOD SUGAR TWO TIMES DAILY   lamoTRIgine (LAMICTAL) 200 MG tablet Take 200 mg by mouth 2 (two) times daily.   Lancets (FREESTYLE) lancets Use as instructed to check sugar 2 times daily   lisinopril (ZESTRIL) 10 MG tablet Take 1 tablet (10 mg total) by mouth daily. (Patient taking differently: Take 20 mg by mouth daily.)   Loratadine (CLARITIN PO) Take by mouth daily.   metFORMIN  (GLUCOPHAGE) 500 MG tablet TAKE 2 TABLETS BY MOUTH TWICE A DAY WITH A MEAL   Multiple Vitamin (MULTIVITAMIN) tablet Take 1 tablet by mouth daily.   tiaGABine (GABITRIL) 4 MG tablet Take 8 mg by mouth 2 (two) times daily.   Turmeric (QC TUMERIC COMPLEX PO) Take by mouth daily.   Current Facility-Administered Medications for the 12/03/20 encounter (Office Visit) with Azalee Course, PA  Medication   0.9 %  sodium chloride infusion     Allergies:  Zofran Frazier Richards hcl]   Social History   Socioeconomic History   Marital status: Married    Spouse name: Not on file   Number of children: 1   Years of education: Not on file   Highest education level: Not on file  Occupational History   Occupation: nursing  Tobacco Use   Smoking status: Never   Smokeless tobacco: Never  Vaping Use   Vaping Use: Never used  Substance and Sexual Activity   Alcohol use: No    Alcohol/week: 0.0 standard drinks   Drug use: No   Sexual activity: Yes    Partners: Male  Other Topics Concern   Not on file  Social History Narrative   Not on file   Social Determinants of Health   Financial Resource Strain: Not on file  Food Insecurity: Not on file  Transportation Needs: Not on file  Physical Activity: Not on file  Stress: Not on file  Social Connections: Not on file     Family History: The patient's family history includes Anxiety disorder in her mother; Asthma in her father; Colon polyps (age of onset: 95) in her father; Diabetes in her mother; Heart disease in her father and mother; Hyperlipidemia in her mother; Hypertension in her father; Lupus in her father; Mental illness in her paternal grandfather; Stroke in her maternal aunt and paternal grandfather. There is no history of Breast cancer, Colon cancer, Esophageal cancer, Stomach cancer, or Rectal cancer.  ROS:   Please see the history of present illness.     All other systems reviewed and are negative.  EKGs/Labs/Other Studies Reviewed:     The following studies were reviewed today:  N/A  EKG:  EKG is not ordered today.    Recent Labs: 07/19/2020: ALT 15; BUN 13; Creatinine, Ser 0.58; Hemoglobin 12.5; Platelets 283.0; Potassium 4.0; Sodium 133; TSH 1.32  Recent Lipid Panel    Component Value Date/Time   CHOL 156 07/19/2020 1435   TRIG 116.0 07/19/2020 1435   HDL 56.40 07/19/2020 1435   CHOLHDL 3 07/19/2020 1435   VLDL 23.2 07/19/2020 1435   LDLCALC 76 07/19/2020 1435   LDLDIRECT 112.0 06/21/2014 1542     Risk Assessment/Calculations:           Physical Exam:    VS:  Ht 5\' 8"  (1.727 m)   Wt 283 lb (128.4 kg)   LMP 01/25/2008   BMI 43.03 kg/m     Wt Readings from Last 3 Encounters:  12/03/20 283 lb (128.4 kg)  11/19/20 279 lb 9.6 oz (126.8 kg)  08/16/20 280 lb 12.8 oz (127.4 kg)     GEN:  Well nourished, well developed in no acute distress HEENT: Normal NECK: No JVD; No carotid bruits LYMPHATICS: No lymphadenopathy CARDIAC: RRR, no murmurs, rubs, gallops RESPIRATORY:  Clear to auscultation without rales, wheezing or rhonchi  ABDOMEN: Soft, non-tender, non-distended MUSCULOSKELETAL:  No edema; No deformity  SKIN: Warm and dry NEUROLOGIC:  Alert and oriented x 3 PSYCHIATRIC:  Normal affect   ASSESSMENT:    1. Syncope and collapse   2. Palpitations   3. Essential hypertension   4. Hyperlipidemia LDL goal <70   5. Controlled type 2 diabetes mellitus without complication, without long-term current use of insulin (HCC)    PLAN:    In order of problems listed above:  Syncope: Occurred near the end of May while traveling to [place].  She was under the sun for 3 hours prior to syncope.  This was suspected to  be related to dehydration.  At this time, I would not recommend any further work-up.  Previous heart monitor from 2021 was reassuring.  If she does have recurrent dizzy spell, I would recommend a 30-day heart monitoring in the future.  Orthostatic vital signs was negative  today.  Palpitation: No significant palpitation recently  Hypertension: Blood pressure is elevated, she recently increased her lisinopril to 20 mg daily.  If blood pressure continues to be elevated greater than 140 mmHg, I would recommend increase lisinopril further to 40 mg daily  Hyperlipidemia: On Lipitor  DM2: Managed by primary care provider.        Medication Adjustments/Labs and Tests Ordered: Current medicines are reviewed at length with the patient today.  Concerns regarding medicines are outlined above.  No orders of the defined types were placed in this encounter.  No orders of the defined types were placed in this encounter.   Patient Instructions  Medication Instructions:  Your physician recommends that you continue on your current medications as directed. Please refer to the Current Medication list given to you today.  *If you need a refill on your cardiac medications before your next appointment, please call your pharmacy*  Lab Work: NONE ordered at this time of appointment   If you have labs (blood work) drawn today and your tests are completely normal, you will receive your results only by: MyChart Message (if you have MyChart) OR A paper copy in the mail If you have any lab test that is abnormal or we need to change your treatment, we will call you to review the results.  Testing/Procedures: NONE ordered at this time of appointment    Follow-Up: At [facility], you and your health needs are our priority.  As part of our continuing mission to provide you with exceptional heart care, we have created designated Provider Care Teams.  These Care Teams include your primary Cardiologist (physician) and Advanced Practice Providers (APPs -  Physician Assistants and Nurse Practitioners) who all work together to provide you with the care you need, when you need it.  Your next appointment:   4-5 month(s)  The format for your next appointment:   In  Person  Provider:   Lennie OdorWesley O'Neal, MD  Other Instructions CONTINUE to monitor Blood pressure (BP) at home, take your morning BP 2 hours after taking morning medications . If Systolic (Top number) BP is consistently greater than 140 after 2 weeks please give our office a call.   Ramond DialSigned, Savas Elvin, [place]PA  12/05/2020 11:59 PM    [facility]

## 2020-12-03 NOTE — Patient Instructions (Signed)
Medication Instructions:  Your physician recommends that you continue on your current medications as directed. Please refer to the Current Medication list given to you today.  *If you need a refill on your cardiac medications before your next appointment, please call your pharmacy*  Lab Work: NONE ordered at this time of appointment   If you have labs (blood work) drawn today and your tests are completely normal, you will receive your results only by: MyChart Message (if you have MyChart) OR A paper copy in the mail If you have any lab test that is abnormal or we need to change your treatment, we will call you to review the results.  Testing/Procedures: NONE ordered at this time of appointment    Follow-Up: At [facility], you and your health needs are our priority.  As part of our continuing mission to provide you with exceptional heart care, we have created designated Provider Care Teams.  These Care Teams include your primary Cardiologist (physician) and Advanced Practice Providers (APPs -  Physician Assistants and Nurse Practitioners) who all work together to provide you with the care you need, when you need it.  Your next appointment:   4-5 month(s)  The format for your next appointment:   In Person  Provider:   Lennie Odor, MD  Other Instructions CONTINUE to monitor Blood pressure (BP) at home, take your morning BP 2 hours after taking morning medications . If Systolic (Top number) BP is consistently greater than 140 after 2 weeks please give our office a call.

## 2020-12-04 ENCOUNTER — Other Ambulatory Visit (HOSPITAL_BASED_OUTPATIENT_CLINIC_OR_DEPARTMENT_OTHER): Payer: Self-pay

## 2020-12-05 ENCOUNTER — Other Ambulatory Visit (HOSPITAL_BASED_OUTPATIENT_CLINIC_OR_DEPARTMENT_OTHER): Payer: Self-pay

## 2020-12-05 ENCOUNTER — Encounter: Payer: Self-pay | Admitting: Physician Assistant

## 2020-12-05 MED ORDER — TEGRETOL-XR 200 MG PO TB12
ORAL_TABLET | ORAL | 0 refills | Status: DC
  Filled 2020-12-05 (×2): qty 270, 90d supply, fill #0

## 2020-12-06 ENCOUNTER — Other Ambulatory Visit (HOSPITAL_BASED_OUTPATIENT_CLINIC_OR_DEPARTMENT_OTHER): Payer: Self-pay

## 2020-12-10 ENCOUNTER — Other Ambulatory Visit (HOSPITAL_BASED_OUTPATIENT_CLINIC_OR_DEPARTMENT_OTHER): Payer: Self-pay

## 2020-12-10 MED ORDER — TIAGABINE HCL 4 MG PO TABS
ORAL_TABLET | Freq: Two times a day (BID) | ORAL | 0 refills | Status: DC
  Filled 2020-12-10: qty 360, 90d supply, fill #0

## 2020-12-11 ENCOUNTER — Encounter: Payer: Self-pay | Admitting: Internal Medicine

## 2020-12-11 ENCOUNTER — Other Ambulatory Visit (HOSPITAL_BASED_OUTPATIENT_CLINIC_OR_DEPARTMENT_OTHER): Payer: Self-pay

## 2020-12-11 ENCOUNTER — Ambulatory Visit: Payer: 59 | Admitting: Internal Medicine

## 2020-12-11 ENCOUNTER — Other Ambulatory Visit: Payer: Self-pay

## 2020-12-11 VITALS — BP 130/82 | HR 65 | Ht 68.0 in | Wt 279.6 lb

## 2020-12-11 DIAGNOSIS — E782 Mixed hyperlipidemia: Secondary | ICD-10-CM

## 2020-12-11 DIAGNOSIS — E119 Type 2 diabetes mellitus without complications: Secondary | ICD-10-CM

## 2020-12-11 LAB — POCT GLYCOSYLATED HEMOGLOBIN (HGB A1C): Hemoglobin A1C: 6.4 % — AB (ref 4.0–5.6)

## 2020-12-11 MED ORDER — METFORMIN HCL 1000 MG PO TABS: 1000.0000 mg | ORAL_TABLET | Freq: Every day | ORAL | 3 refills | Status: DC

## 2020-12-11 NOTE — Addendum Note (Signed)
Addended by: Kenyon Ana on: 12/11/2020 06:59 PM   Modules accepted: Orders

## 2020-12-11 NOTE — Patient Instructions (Addendum)
Please decrease: - Metformin 1000 mg with dinner  Continue: - Trulicity 1.5 mg weekly  Please come back for a follow-up appointment in 4-6 months.

## 2020-12-11 NOTE — Progress Notes (Signed)
Patient ID: [name], female   DOB: 09-Feb-1971, 50 y.o.   MRN: [id number]  This visit occurred during the SARS-CoV-2 public health emergency.  Safety protocols were in place, including screening questions prior to the visit, additional usage of staff PPE, and extensive cleaning of exam room while observing appropriate contact time as indicated for disinfecting solutions.   HPI: [name] is a 50 y.o.-year-old female, presenting for f/u for DM2, dx in 2001, non-insulin-dependent, uncontrolled, without long term complications. Last visit 4 months ago.  Interim history: Since last visit, she had a syncopal episode in [place] in 09/2020, after staying in the sun for 3 hours.  At that time, she went to the emergency room.  She was given IV fluids.  EKG was normal.  HCTZ was stopped and lisinopril was increased.  Next day, she tested positive for COVID.  This was complicated by bronchitis > had ABx. She had a previous syncopal episode in [place] in 03/2020. As of now, she is feeling well, without complaints.  No increased urination, blurry vision, nausea, chest pain.  Reviewed HbA1c levels: Lab Results  Component Value Date   HGBA1C 6.6 (A) 08/16/2020   HGBA1C 8.1 (A) 12/15/2019   HGBA1C 7.6 (H) 07/12/2019   HGBA1C 6.7 (A) 11/30/2018   HGBA1C 8.5 (A) 07/26/2018   HGBA1C 9.9 (H) 04/15/2018   HGBA1C 7.6 08/03/2017   HGBA1C 6.6 (H) 12/30/2016   HGBA1C 6.1 (H) 10/09/2016   HGBA1C 9.6 02/21/2016   HGBA1C 9.2 10/05/2015   HGBA1C 10.4 05/31/2015   HGBA1C 8.8 01/09/2015   HGBA1C 11.0 (H) 06/21/2014   HGBA1C 8.8 (H) 01/03/2014   HGBA1C 9.6 (H) 04/12/2013   HGBA1C 11.0 (H) 11/18/2012   HGBA1C 9.0 (H) 11/12/2011   HGBA1C 9.6 (H) 06/23/2011   Pt was on a regimen of: - Metformin 2000 mg at dinnertime - Lantus 63 units at bedtime - Victoza 1.8 mg daily - Amaryl 4 mg 2x a day She had frequent yeast inf when sugars were higher before.   Pt is now on a regimen of: -  Metformin 500 mg 2x a day >> 1000 mg 2x a day -increased in 03/2018 - Trulicity 0.75 mg weekly-added 07/2018 >> restarted 11/2019 -initially had nausea, then resolved >> 1.5 mg weekly  She is checking sugars 1-3x  a day: - am:  114-130, 140 >> 105-140 >> 110-130 >> 110-140 >> 93-143 >> 87-128 - 2h after b'fast: 145-176 >> n/c >> 71-100 >> 83-99 - before lunch: 82-167 >> 120s >> n/c >> 110-120 >> n/c >> 90, 93 >> 76-102 - 2h after lunch:  106-166 >> n/c >> 84, 192 >> 80-94 - before dinner: 86-110 >> n/c >> 120-150 (yoghurt) >> n/c >> 95, 99 >> 85-111 - 2h after dinner: 83-101 >> 100-120 >> n/c  - bedtime: 130-155 >>  200-225, OTW: 120-150 >> 170-200 >> 118-160 >> 91-133 - nighttime: n/c Lowest sugar was 34 (after Sx - while still on insulin) >> ...110 >> 110 >> 93 >> 71 >> 76; she has hypoglycemia awareness in the 90s. Highest sugar was 330 >> ... 225 >> 225 >> 200 >> 160 >> 178.  Glucometer: One Touch Ultra 2 >> FreeStyle Lite   -No CKD, last BUN/creatinine:  Lab Results  Component Value Date   BUN 13 07/19/2020   CREATININE 0.58 07/19/2020  On lisinopril 20. -+ HL; last set of lipids: Lab Results  Component Value Date   CHOL 156 07/19/2020   HDL 56.40  07/19/2020   LDLCALC 76 07/19/2020   LDLDIRECT 112.0 06/21/2014   TRIG 116.0 07/19/2020   CHOLHDL 3 07/19/2020  On Lipitor 40. - last eye exam was 11/25/2020: + DR.  She had cataracts but is status post cataract surgery.  Dr. Emily Filbert. Will see retina specialist. -No numbness and tingling in her feet.   She had Roux-en-Y gastric bypass surgery in 03/2016.  After this, she lost more than 100 pounds but she gained more than 50 pounds back afterwards.  ROS: Constitutional: no weight gain/no weight loss, no fatigue, no subjective hyperthermia, no subjective hypothermia Eyes: no blurry vision, no xerophthalmia ENT: no sore throat, no nodules palpated in neck, no dysphagia, no odynophagia, no hoarseness Cardiovascular: no CP/no SOB/no  palpitations/no leg swelling Respiratory: no cough/no SOB/no wheezing Gastrointestinal: no N/no V/no D/no C/no acid reflux Musculoskeletal: no muscle aches/no joint aches Skin: no rashes, no hair loss Neurological: no tremors/no numbness/no tingling/no dizziness  I reviewed pt's medications, allergies, PMH, social hx, family hx, and changes were documented in the history of present illness. Otherwise, unchanged from my initial visit note.  Past Medical History:  Diagnosis Date   Allergy    seasonal allergies   Anemia    hx of   Anxiety    on meds   Asthma    hx of   Bipolar disorder (HCC)    Cataract    bilateral sx   Chicken pox    Complication of anesthesia    oxygen saturation dropped after hysterectomy 2009 at [facility]   Depression    on meds   Diabetes mellitus    on meds   Dyspnea    GERD (gastroesophageal reflux disease)    hx of   Hyperlipidemia    on meds   Hypertension    readings   Hypothyroidism    hx of-LEFT thyroid removed   Irregular heartbeat    saw dr Myrtis Ser sees -cardiology as needed   Migraine    Sleep apnea    uses cpap   Past Surgical History:  Procedure Laterality Date   ANKLE SURGERY Left 1989   BACK SURGERY  1999   CATARACT EXTRACTION, BILATERAL     CESAREAN SECTION     1995   LAPAROSCOPIC ASSISTED VAGINAL HYSTERECTOMY  2009     BSO fibroids, DUB, pelvic pain   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 REPAIR N/A 04/14/2016   Procedure: LAPAROSCOPIC ROUX-EN-Y GASTRIC BYPASS  WITH UPPER ENDOSCOPY;  Surgeon: Glenna Fellows, MD;  Location: [facility] ORS;  Service: General;  Laterality: N/A;   SHOULDER ARTHROSCOPY WITH OPEN ROTATOR CUFF REPAIR Right 01/02/2017   Procedure: Right shoulder arthroscopy, A-subcromial decompression, mini open rotator cuff repair, open distal clavicle resection, biceps tenodesis;  Surgeon: Beverely Low, MD;  Location: [facility] OR;  Service: Orthopedics;  Laterality: Right;   THYROIDECTOMY, PARTIAL Left 2001    UMBILICAL HERNIA REPAIR  2003   WISDOM TOOTH EXTRACTION     Social History   Social History   Marital Status: Married    Spouse Name: N/A   Number of Children: 1   Occupational History   Passenger transport manager, Administrator   Social History Main Topics   Smoking status: Never Smoker    Smokeless tobacco: Never Used   Alcohol Use: No   Drug Use: No   Current Outpatient Medications on File Prior to Visit  Medication Sig Dispense Refill   ARIPiprazole (ABILIFY) 10 MG tablet Take 10 mg by mouth daily.   1  ASPIRIN PO Take 162 mg by mouth daily.     atorvastatin (LIPITOR) 40 MG tablet TAKE 1 TABLET BY MOUTH DAILY 90 tablet 1   Biotin 4098110000 MCG TABS Take 1 tablet by mouth daily.     Blood Glucose Monitoring Suppl (FREESTYLE LITE) DEVI Use to check sugar 2 times daily 1 each 0   Calcium Carbonate-Vit D-Min (CALTRATE 600+D PLUS MINIS PO) Take 1 tablet by mouth 3 (three) times daily.     carbamazepine (TEGRETOL XR) 200 MG 12 hr tablet 1 tablet in the morning, 2 at night     clobetasol ointment (TEMOVATE) 0.05 % APPLY 1 APPLICATION TOPICALLY 2 TIMES DAILY. DO NOT USE FOR MORE THAN 7 DAYS 60 g 1   Dulaglutide 1.5 MG/0.5ML SOPN INJECT 1.5MG  INTO THE SKIN ONCE A WEEK 2 mL 3   gabapentin (NEURONTIN) 600 MG tablet TAKE 1 TABLET BY MOUTH THREE TIMES DAILY 270 tablet 1   Glucosamine HCl (GLUCOSAMINE PO) Take 3 tablets by mouth daily.     glucose blood test strip USE AS DIRECTED TO CHECK BLOOD SUGAR TWO TIMES DAILY 200 strip 5   lamoTRIgine (LAMICTAL) 200 MG tablet Take 200 mg by mouth 2 (two) times daily.     Lancets (FREESTYLE) lancets Use as instructed to check sugar 2 times daily 200 each 5   lisinopril (ZESTRIL) 10 MG tablet Take 1 tablet (10 mg total) by mouth daily. (Patient taking differently: Take 20 mg by mouth daily.) 30 tablet 3   Loratadine (CLARITIN PO) Take by mouth daily.     metFORMIN (GLUCOPHAGE) 500 MG tablet TAKE 2 TABLETS BY MOUTH TWICE A DAY WITH A MEAL 360 tablet 2   Multiple  Vitamin (MULTIVITAMIN) tablet Take 1 tablet by mouth daily.     TEGRETOL-XR 200 MG 12 hr tablet Take 1 tablet by mouth every morning and take 2 tablets at bedtime 270 tablet 0   tiaGABine (GABITRIL) 4 MG tablet Take 8 mg by mouth 2 (two) times daily.     tiaGABine (GABITRIL) 4 MG tablet TAKE 2 TABLETS BY MOUTH TWICE A DAY 360 tablet 0   Turmeric (QC TUMERIC COMPLEX PO) Take by mouth daily.     Current Facility-Administered Medications on File Prior to Visit  Medication Dose Route Frequency Provider Last Rate Last Admin   0.9 %  sodium chloride infusion  500 mL Intravenous Once Nandigam, Eleonore ChiquitoKavitha V, MD       Allergies  Allergen Reactions   Zofran [Ondansetron Hcl] Other (See Comments)    Severe headache   Family History  Problem Relation Age of Onset   Hyperlipidemia Mother    Diabetes Mother    Anxiety disorder Mother    Heart disease Mother    Hypertension Father    Lupus Father    Heart disease Father    Asthma Father    Colon polyps Father [phone]   Stroke Maternal Aunt    Stroke Paternal Grandfather    Mental illness Paternal Grandfather    Breast cancer Neg Hx    Colon cancer Neg Hx    Esophageal cancer Neg Hx    Stomach cancer Neg Hx    Rectal cancer Neg Hx    PE: BP 130/82 (BP Location: Right Arm, Patient Position: Sitting, Cuff Size: Normal)   Pulse 65   Ht 5\' 8"  (1.727 m)   Wt 279 lb 9.6 oz (126.8 kg)   LMP 01/25/2008   SpO2 97%   BMI 42.51 kg/m  Wt Readings  from Last 3 Encounters:  12/11/20 279 lb 9.6 oz (126.8 kg)  12/03/20 283 lb (128.4 kg)  11/19/20 279 lb 9.6 oz (126.8 kg)   Constitutional: overweight, in NAD Eyes: PERRLA, EOMI, no exophthalmos ENT: moist mucous membranes, no thyromegaly, no cervical lymphadenopathy Cardiovascular: RRR, No MRG Respiratory: CTA B Gastrointestinal: abdomen soft, NT, ND, BS+ Musculoskeletal: no deformities, strength intact in all 4 Skin: moist, warm, no rashes Neurological: no tremor with outstretched hands, DTR normal in  all 4  ASSESSMENT: 1. DM2, insulin-independent, uncontrolled, without long-term complications  2. Obesity class III  3. HL  PLAN:  1. Patient with longstanding, previously uncontrolled type 2 diabetes, on a complex antidiabetic regimen in the past, but with improvement of control after her gastric bypass surgery in 2017 and losing more than 100 pounds.  She gained weight back afterwards and sugars worsened, with an HbA1c of 9.9%.  At that time, we increased metformin and added Trulicity.  Sugars started to improve and at last visit, HbA1c was 6.6%.  We did not change her regimen at that time. -At today's visit, sugars are excellent, all at goal.  In fact, most of her fasting blood sugars are between 80s and 90s so at this visit I advised her to decrease her metformin dose to only 1000 mg with dinner.  We will continue Trulicity at the same dose for both diabetes control and weight control. - I advised her to:  Patient Instructions  Please decrease: - Metformin 1000 mg with dinner  Continue: - Trulicity 1.5 mg weekly  Please come back for a follow-up appointment in 4-6 months.  - we checked her HbA1c: 6.4% (improved) - advised to check sugars at different times of the day - 1x a day, rotating check times - advised for yearly eye exams >> she is UTD - return to clinic in 4 months     2. Obesity class III -She has a history of Roux-en-Y gastric bypass surgery, after which she lost a significant amount of weight but she started to gain weight back afterwards.  Weight stable since last visit. -We will continue Trulicity which should also help with weight loss -She continues exercise-going to the gym and walking in the neighborhood  3. HL -Reviewed latest lipid panel from 06/2020: Fractions at goal: Lab Results  Component Value Date   CHOL 156 07/19/2020   HDL 56.40 07/19/2020   LDLCALC 76 07/19/2020   LDLDIRECT 112.0 06/21/2014   TRIG 116.0 07/19/2020   CHOLHDL 3 07/19/2020   -Continues Lipitor 40 mg daily without side effects  Carlus Pavlov, MD PhD [facility] Endocrinology

## 2020-12-12 ENCOUNTER — Other Ambulatory Visit (HOSPITAL_BASED_OUTPATIENT_CLINIC_OR_DEPARTMENT_OTHER): Payer: Self-pay

## 2020-12-18 ENCOUNTER — Other Ambulatory Visit: Payer: Self-pay | Admitting: Family Medicine

## 2020-12-18 ENCOUNTER — Other Ambulatory Visit (HOSPITAL_BASED_OUTPATIENT_CLINIC_OR_DEPARTMENT_OTHER): Payer: Self-pay

## 2020-12-18 DIAGNOSIS — F3132 Bipolar disorder, current episode depressed, moderate: Secondary | ICD-10-CM | POA: Diagnosis not present

## 2020-12-18 DIAGNOSIS — Z1231 Encounter for screening mammogram for malignant neoplasm of breast: Secondary | ICD-10-CM

## 2020-12-18 DIAGNOSIS — F411 Generalized anxiety disorder: Secondary | ICD-10-CM | POA: Diagnosis not present

## 2020-12-18 MED ORDER — ARIPIPRAZOLE 10 MG PO TABS
10.0000 mg | ORAL_TABLET | Freq: Every morning | ORAL | 1 refills | Status: DC
  Filled 2020-12-18 – 2021-02-15 (×2): qty 90, 90d supply, fill #0
  Filled 2021-05-06: qty 90, 90d supply, fill #1

## 2020-12-18 MED ORDER — LAMOTRIGINE 200 MG PO TABS
200.0000 mg | ORAL_TABLET | Freq: Two times a day (BID) | ORAL | 1 refills | Status: DC
  Filled 2020-12-18 – 2021-03-01 (×2): qty 180, 90d supply, fill #0
  Filled 2021-06-03: qty 180, 90d supply, fill #1

## 2020-12-18 MED ORDER — GABAPENTIN 600 MG PO TABS
600.0000 mg | ORAL_TABLET | Freq: Three times a day (TID) | ORAL | 1 refills | Status: DC
  Filled 2020-12-18: qty 270, 90d supply, fill #0
  Filled 2021-05-06: qty 270, 90d supply, fill #1

## 2020-12-18 MED ORDER — TIAGABINE HCL 4 MG PO TABS
8.0000 mg | ORAL_TABLET | Freq: Two times a day (BID) | ORAL | 0 refills | Status: DC
  Filled 2020-12-18 – 2021-03-21 (×2): qty 360, 90d supply, fill #0

## 2020-12-18 MED ORDER — TEGRETOL-XR 200 MG PO TB12
ORAL_TABLET | ORAL | 0 refills | Status: DC
  Filled 2020-12-18 – 2021-03-01 (×2): qty 270, 90d supply, fill #0

## 2020-12-19 ENCOUNTER — Other Ambulatory Visit (HOSPITAL_BASED_OUTPATIENT_CLINIC_OR_DEPARTMENT_OTHER): Payer: Self-pay

## 2020-12-20 ENCOUNTER — Other Ambulatory Visit (HOSPITAL_BASED_OUTPATIENT_CLINIC_OR_DEPARTMENT_OTHER): Payer: Self-pay

## 2020-12-20 ENCOUNTER — Other Ambulatory Visit: Payer: Self-pay | Admitting: Internal Medicine

## 2020-12-20 ENCOUNTER — Encounter: Payer: Self-pay | Admitting: Internal Medicine

## 2020-12-20 DIAGNOSIS — E119 Type 2 diabetes mellitus without complications: Secondary | ICD-10-CM

## 2020-12-20 MED ORDER — METFORMIN HCL 1000 MG PO TABS
1000.0000 mg | ORAL_TABLET | Freq: Every day | ORAL | 3 refills | Status: DC
  Filled 2020-12-20: qty 90, 90d supply, fill #0
  Filled 2021-03-21: qty 90, 90d supply, fill #1

## 2020-12-21 ENCOUNTER — Other Ambulatory Visit (HOSPITAL_BASED_OUTPATIENT_CLINIC_OR_DEPARTMENT_OTHER): Payer: Self-pay

## 2020-12-21 NOTE — Telephone Encounter (Signed)
Thank you. I spoke with Mrs. Pop, her BP looks good. I am also fine with staying on the 20mg  daily dosing.

## 2020-12-28 ENCOUNTER — Other Ambulatory Visit (HOSPITAL_BASED_OUTPATIENT_CLINIC_OR_DEPARTMENT_OTHER): Payer: Self-pay

## 2020-12-28 MED ORDER — LISINOPRIL 40 MG PO TABS
40.0000 mg | ORAL_TABLET | Freq: Every day | ORAL | 6 refills | Status: DC
  Filled 2020-12-28: qty 30, 30d supply, fill #0
  Filled 2021-02-05: qty 30, 30d supply, fill #1

## 2020-12-31 ENCOUNTER — Other Ambulatory Visit (HOSPITAL_BASED_OUTPATIENT_CLINIC_OR_DEPARTMENT_OTHER): Payer: Self-pay

## 2021-01-08 ENCOUNTER — Ambulatory Visit: Payer: 59 | Admitting: Registered Nurse

## 2021-01-08 ENCOUNTER — Other Ambulatory Visit: Payer: Self-pay

## 2021-01-08 ENCOUNTER — Other Ambulatory Visit (HOSPITAL_BASED_OUTPATIENT_CLINIC_OR_DEPARTMENT_OTHER): Payer: Self-pay

## 2021-01-08 VITALS — BP 159/82 | HR 70 | Temp 98.1°F | Resp 18 | Ht 68.0 in | Wt 279.8 lb

## 2021-01-08 DIAGNOSIS — I10 Essential (primary) hypertension: Secondary | ICD-10-CM

## 2021-01-08 MED ORDER — AMLODIPINE BESYLATE 5 MG PO TABS
5.0000 mg | ORAL_TABLET | Freq: Every day | ORAL | 0 refills | Status: DC
Start: 2021-01-08 — End: 2021-03-07
  Filled 2021-01-08: qty 90, 90d supply, fill #0

## 2021-01-08 NOTE — Patient Instructions (Signed)
° ° ° °  If you have lab work done today you will be contacted with your lab results within the next 2 weeks.  If you have not heard from us then please contact us. The fastest way to get your results is to register for My Chart. ° ° °IF you received an x-ray today, you will receive an invoice from [facility]. Please contact [facility] at [phone] with questions or concerns regarding your invoice.  ° °IF you received labwork today, you will receive an invoice from [facility]. Please contact [facility] at [phone] with questions or concerns regarding your invoice.  ° °Our billing staff will not be able to assist you with questions regarding bills from these companies. ° °You will be contacted with the lab results as soon as they are available. The fastest way to get your results is to activate your My Chart account. Instructions are located on the last page of this paperwork. If you have not heard from us regarding the results in 2 weeks, please contact this office. °  ° ° ° °

## 2021-01-11 ENCOUNTER — Other Ambulatory Visit (HOSPITAL_BASED_OUTPATIENT_CLINIC_OR_DEPARTMENT_OTHER): Payer: Self-pay

## 2021-01-14 ENCOUNTER — Encounter: Payer: 59 | Admitting: Family Medicine

## 2021-01-17 DIAGNOSIS — E113293 Type 2 diabetes mellitus with mild nonproliferative diabetic retinopathy without macular edema, bilateral: Secondary | ICD-10-CM | POA: Diagnosis not present

## 2021-01-17 DIAGNOSIS — H43813 Vitreous degeneration, bilateral: Secondary | ICD-10-CM | POA: Diagnosis not present

## 2021-01-17 DIAGNOSIS — H43393 Other vitreous opacities, bilateral: Secondary | ICD-10-CM | POA: Diagnosis not present

## 2021-01-31 ENCOUNTER — Other Ambulatory Visit (HOSPITAL_BASED_OUTPATIENT_CLINIC_OR_DEPARTMENT_OTHER): Payer: Self-pay

## 2021-01-31 MED ORDER — SPIRONOLACTONE 25 MG PO TABS
25.0000 mg | ORAL_TABLET | Freq: Every day | ORAL | 3 refills | Status: DC
  Filled 2021-01-31: qty 90, 90d supply, fill #0

## 2021-02-05 ENCOUNTER — Other Ambulatory Visit (HOSPITAL_BASED_OUTPATIENT_CLINIC_OR_DEPARTMENT_OTHER): Payer: Self-pay

## 2021-02-06 ENCOUNTER — Other Ambulatory Visit: Payer: Self-pay

## 2021-02-06 ENCOUNTER — Ambulatory Visit (INDEPENDENT_AMBULATORY_CARE_PROVIDER_SITE_OTHER): Payer: 59 | Admitting: Family Medicine

## 2021-02-06 ENCOUNTER — Encounter: Payer: Self-pay | Admitting: Family Medicine

## 2021-02-06 VITALS — BP 118/74 | HR 56 | Temp 98.1°F | Resp 20 | Ht 69.0 in | Wt 283.2 lb

## 2021-02-06 DIAGNOSIS — I10 Essential (primary) hypertension: Secondary | ICD-10-CM | POA: Diagnosis not present

## 2021-02-06 DIAGNOSIS — Z0000 Encounter for general adult medical examination without abnormal findings: Secondary | ICD-10-CM | POA: Diagnosis not present

## 2021-02-06 LAB — CBC WITH DIFFERENTIAL/PLATELET
Basophils Absolute: 0.1 10*3/uL (ref 0.0–0.1)
Basophils Relative: 1 % (ref 0.0–3.0)
Eosinophils Absolute: 0.7 10*3/uL (ref 0.0–0.7)
Eosinophils Relative: 8.9 % — ABNORMAL HIGH (ref 0.0–5.0)
HCT: 37.9 % (ref 36.0–46.0)
Hemoglobin: 12.4 g/dL (ref 12.0–15.0)
Lymphocytes Relative: 32.7 % (ref 12.0–46.0)
Lymphs Abs: 2.5 10*3/uL (ref 0.7–4.0)
MCHC: 32.8 g/dL (ref 30.0–36.0)
MCV: 87.3 fl (ref 78.0–100.0)
Monocytes Absolute: 0.6 10*3/uL (ref 0.1–1.0)
Monocytes Relative: 7.9 % (ref 3.0–12.0)
Neutro Abs: 3.7 10*3/uL (ref 1.4–7.7)
Neutrophils Relative %: 49.5 % (ref 43.0–77.0)
Platelets: 262 10*3/uL (ref 150.0–400.0)
RBC: 4.34 Mil/uL (ref 3.87–5.11)
RDW: 13.9 % (ref 11.5–15.5)
WBC: 7.5 10*3/uL (ref 4.0–10.5)

## 2021-02-06 LAB — BASIC METABOLIC PANEL
BUN: 8 mg/dL (ref 6–23)
CO2: 28 mEq/L (ref 19–32)
Calcium: 9 mg/dL (ref 8.4–10.5)
Chloride: 94 mEq/L — ABNORMAL LOW (ref 96–112)
Creatinine, Ser: 0.51 mg/dL (ref 0.40–1.20)
GFR: 109.76 mL/min (ref >60.00)
Glucose, Bld: 118 mg/dL — ABNORMAL HIGH (ref 70–99)
Potassium: 4.3 mEq/L (ref 3.5–5.1)
Sodium: 131 mEq/L — ABNORMAL LOW (ref 135–145)

## 2021-02-06 LAB — HEPATIC FUNCTION PANEL
ALT: 18 U/L (ref 0–35)
AST: 17 U/L (ref 0–37)
Albumin: 3.9 g/dL (ref 3.5–5.2)
Alkaline Phosphatase: 107 U/L (ref 39–117)
Bilirubin, Direct: 0.1 mg/dL (ref 0.0–0.3)
Total Bilirubin: 0.3 mg/dL (ref 0.2–1.2)
Total Protein: 6.6 g/dL (ref 6.0–8.3)

## 2021-02-06 LAB — LIPID PANEL
Cholesterol: 150 mg/dL (ref 0–200)
HDL: 60.3 mg/dL (ref >39.00)
LDL Cholesterol: 64 mg/dL (ref 0–99)
NonHDL: 89.84
Total CHOL/HDL Ratio: 2
Triglycerides: 129 mg/dL (ref 0.0–149.0)
VLDL: 25.8 mg/dL (ref 0.0–40.0)

## 2021-02-06 LAB — TSH: TSH: 1.2 u[IU]/mL (ref 0.35–5.50)

## 2021-02-06 NOTE — Assessment & Plan Note (Signed)
Chronic problem.  Currently well controlled.  Her wrist cuff today in office is not at all accurate and reading very high.  Encouraged her to have BP checked at work for more reliable readings.  Pt expressed understanding and is in agreement w/ plan.

## 2021-02-06 NOTE — Patient Instructions (Addendum)
Follow up in 6 months to recheck BP and cholesterol We'll notify you of your lab results and make any changes if needed Continue to work on healthy diet and regular exercise- you can do it!! Consider Nutrafol for hair loss Send me the info when you get your flu shot Call with any questions or concerns Stay Safe!  Stay Healthy! Happy Fall!  Safe travels!!

## 2021-02-06 NOTE — Assessment & Plan Note (Signed)
Pt's PE WNL w/ exception of obesity.  UTD on colonoscopy.  mammo is scheduled.  Will get flu shot at work.  Check labs.  Anticipatory guidance provided.

## 2021-02-06 NOTE — Progress Notes (Signed)
   Subjective:    Patient ID: [name], female    DOB: 06/22/1970, 50 y.o.   MRN: [id number]  HPI CPE- UTD on colonoscopy, mammo is scheduled.  UTD on eye exam.  UTD on Tdap.  Will get flu shot at work.  Patient Care Team    Relationship Specialty Notifications Start End  Sheliah Hatch, MD PCP - General Family Medicine  06/24/11   O'Neal, Ronnald Ramp, MD PCP - Cardiology Cardiology  12/05/20   Jerene Bears, MD Consulting Physician Gynecology  07/26/15   Carlus Pavlov, MD Consulting Physician Internal Medicine  07/26/15   Deatra Robinson, NP  Nurse Practitioner  07/19/20     Health Maintenance  Topic Date Due   COVID-19 Vaccine (1) Never done   MAMMOGRAM  09/28/2020   INFLUENZA VACCINE  12/24/2020   FOOT EXAM  01/08/2022 (Originally 07/11/2020)   HEMOGLOBIN A1C  06/13/2021   OPHTHALMOLOGY EXAM  11/28/2021   TETANUS/TDAP  02/20/2028   COLONOSCOPY (Pts 45-23yrs Insurance coverage will need to be confirmed)  04/26/2030   PNEUMOCOCCAL POLYSACCHARIDE VACCINE AGE 81-64 HIGH RISK  Completed   Hepatitis C Screening  Completed   HIV Screening  Completed   Pneumococcal Vaccine 22-64 Years old  Aged Out   HPV VACCINES  Aged Out      Review of Systems Patient reports no vision/ hearing changes, adenopathy,fever, weight change,  persistant/recurrent hoarseness , swallowing issues, chest pain, palpitations, edema, persistant/recurrent cough, hemoptysis, dyspnea (rest/exertional/paroxysmal nocturnal), gastrointestinal bleeding (melena, rectal bleeding), abdominal pain, significant heartburn, bowel changes, GU symptoms (dysuria, hematuria, incontinence), Gyn symptoms (abnormal  bleeding, pain),  syncope, focal weakness, memory loss, skin/nail changes, abnormal bruising or bleeding, anxiety, or depression.   + numbness of both feet  + hair loss    Objective:   Physical Exam General Appearance:    Alert, cooperative, no distress, appears stated age  Head:    Normocephalic, without  obvious abnormality, atraumatic  Eyes:    PERRL, conjunctiva/corneas clear, EOM's intact, fundi    benign, both eyes  Ears:    Normal TM's and external ear canals, both ears  Nose:   Deferred due to COVID  Throat:   Neck:   Supple, symmetrical, trachea midline, no adenopathy;    Thyroid: no enlargement/tenderness/nodules  Back:     Symmetric, no curvature, ROM normal, no CVA tenderness  Lungs:     Clear to auscultation bilaterally, respirations unlabored  Chest Wall:    No tenderness or deformity   Heart:    Regular rate and rhythm, S1 and S2 normal, no murmur, rub   or gallop  Breast Exam:    Deferred to GYN  Abdomen:     Soft, non-tender, bowel sounds active all four quadrants,    no masses, no organomegaly  Genitalia:    Deferred to GYN  Rectal:    Extremities:   Extremities normal, atraumatic, no cyanosis or edema  Pulses:   2+ and symmetric all extremities  Skin:   Skin color, texture, turgor normal, no rashes or lesions  Lymph nodes:   Cervical, supraclavicular, and axillary nodes normal  Neurologic:   CNII-XII intact, normal strength, sensation and reflexes    throughout          Assessment & Plan:

## 2021-02-07 ENCOUNTER — Ambulatory Visit: Payer: 59

## 2021-02-15 ENCOUNTER — Other Ambulatory Visit (HOSPITAL_BASED_OUTPATIENT_CLINIC_OR_DEPARTMENT_OTHER): Payer: Self-pay

## 2021-02-15 MED FILL — Clobetasol Propionate Oint 0.05%: CUTANEOUS | 15 days supply | Qty: 30 | Fill #0 | Status: AC

## 2021-02-15 MED FILL — Dulaglutide Soln Auto-injector 1.5 MG/0.5ML: SUBCUTANEOUS | 28 days supply | Qty: 2 | Fill #1 | Status: AC

## 2021-02-17 NOTE — Progress Notes (Signed)
Established Patient Office Visit  Subjective:  Patient ID: [name], female    DOB: 1970/11/14  Age: 50 y.o. MRN: [id number]  CC:  Chief Complaint  Patient presents with   Follow-up    Patient states she is here for Follow up for HTN.    HPI [name] presents for htn  Hypertension: Patient Currently taking: amlodipine 5mg  po qd Good effect. No AEs. Denies CV symptoms including: chest pain, shob, doe, headache, visual changes, fatigue, claudication, and dependent edema.   Previous readings and labs: BP Readings from Last 3 Encounters:  02/06/21 118/74  01/08/21 (!) 159/82  12/11/20 130/82   Lab Results  Component Value Date   CREATININE 0.51 02/06/2021    Has upcoming visit with PCP. Would prefer to wait for labs until that time.  Past Medical History:  Diagnosis Date   Allergy    seasonal allergies   Anemia    hx of   Anxiety    on meds   Asthma    hx of   Bipolar disorder (HCC)    Cataract    bilateral sx   Chicken pox    Complication of anesthesia    oxygen saturation dropped after hysterectomy 2009 at [facility]   Depression    on meds   Diabetes mellitus    on meds   Dyspnea    GERD (gastroesophageal reflux disease)    hx of   Hyperlipidemia    on meds   Hypertension    readings   Hypothyroidism    hx of-LEFT thyroid removed   Irregular heartbeat    saw dr [facility] sees -cardiology as needed   Migraine    Sleep apnea    uses cpap    Past Surgical History:  Procedure Laterality Date   ANKLE SURGERY Left 1989   BACK SURGERY  1999   CATARACT EXTRACTION, BILATERAL     CESAREAN SECTION     1995   LAPAROSCOPIC ASSISTED VAGINAL HYSTERECTOMY  2009     BSO fibroids, DUB, pelvic pain   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 REPAIR N/A 04/14/2016   Procedure: LAPAROSCOPIC ROUX-EN-Y GASTRIC BYPASS  WITH UPPER ENDOSCOPY;  Surgeon: 04/16/2016, MD;  Location: [facility];  Service: General;  Laterality: N/A;    SHOULDER ARTHROSCOPY WITH OPEN ROTATOR CUFF REPAIR Right 01/02/2017   Procedure: Right shoulder arthroscopy, A-subcromial decompression, mini open rotator cuff repair, open distal clavicle resection, biceps tenodesis;  Surgeon: 03/04/2017, MD;  Location: [facility] OR;  Service: Orthopedics;  Laterality: Right;   THYROIDECTOMY, PARTIAL Left 2001   UMBILICAL HERNIA REPAIR  2003   WISDOM TOOTH EXTRACTION      Family History  Problem Relation Age of Onset   Hyperlipidemia Mother    Diabetes Mother    Anxiety disorder Mother    Heart disease Mother    Hypertension Father    Lupus Father    Heart disease Father    Asthma Father    Colon polyps Father 31   Stroke Maternal Aunt    Stroke Paternal Grandfather    Mental illness Paternal Grandfather    Breast cancer Neg Hx    Colon cancer Neg Hx    Esophageal cancer Neg Hx    Stomach cancer Neg Hx    Rectal cancer Neg Hx     Social History   Socioeconomic History   Marital status: Married    Spouse name: Not on file   Number of children: 1  Years of education: Not on file   Highest education level: Not on file  Occupational History   Occupation: nursing  Tobacco Use   Smoking status: Never   Smokeless tobacco: Never  Vaping Use   Vaping Use: Never used  Substance and Sexual Activity   Alcohol use: No    Alcohol/week: 0.0 standard drinks   Drug use: No   Sexual activity: Yes    Partners: Male  Other Topics Concern   Not on file  Social History Narrative   Not on file   Social Determinants of Health   Financial Resource Strain: Not on file  Food Insecurity: Not on file  Transportation Needs: Not on file  Physical Activity: Not on file  Stress: Not on file  Social Connections: Not on file  Intimate Partner Violence: Not on file    Outpatient Medications Prior to Visit  Medication Sig Dispense Refill   ARIPiprazole (ABILIFY) 10 MG tablet Take 10 mg by mouth daily.   1   ARIPiprazole (ABILIFY) 10 MG tablet TAKE 1  TABLET BY MOUTH EVERY MORNING (Patient not taking: Reported on 02/06/2021) 90 tablet 1   ASPIRIN PO Take 162 mg by mouth daily.     atorvastatin (LIPITOR) 40 MG tablet TAKE 1 TABLET BY MOUTH DAILY 90 tablet 1   Biotin 88416 MCG TABS Take 1 tablet by mouth daily.     Blood Glucose Monitoring Suppl (FREESTYLE LITE) DEVI Use to check sugar 2 times daily 1 each 0   Calcium Carbonate-Vit D-Min (CALTRATE 600+D PLUS MINIS PO) Take 1 tablet by mouth 3 (three) times daily.     carbamazepine (TEGRETOL XR) 200 MG 12 hr tablet 1 tablet in the morning, 2 at night     clobetasol ointment (TEMOVATE) 0.05 % APPLY 1 APPLICATION ON TO THE SKIN 2 TIMES DAILY. DO NOT USE FOR MORE THAN 7 DAYS 60 g 1   Dulaglutide 1.5 MG/0.5ML SOPN INJECT 1.5MG  INTO THE SKIN ONCE A WEEK 2 mL 3   gabapentin (NEURONTIN) 600 MG tablet Take 1 tablet by mouth three times a day 270 tablet 1   Glucosamine HCl (GLUCOSAMINE PO) Take 3 tablets by mouth daily.     lamoTRIgine (LAMICTAL) 200 MG tablet Take 200 mg by mouth 2 (two) times daily.     lamoTRIgine (LAMICTAL) 200 MG tablet TAKE 1 TABLET BY MOUTH TWICE A DAY (Patient not taking: Reported on 02/06/2021) 180 tablet 1   Lancets (FREESTYLE) lancets Use as instructed to check sugar 2 times daily 200 each 5   lisinopril (ZESTRIL) 40 MG tablet Take 1 tablet (40 mg total) by mouth daily. 30 tablet 6   Loratadine (CLARITIN PO) Take by mouth daily.     metFORMIN (GLUCOPHAGE) 1000 MG tablet Take 1 tablet (1,000 mg total) by mouth daily with supper. 90 tablet 3   Multiple Vitamin (MULTIVITAMIN) tablet Take 1 tablet by mouth daily.     TEGRETOL-XR 200 MG 12 hr tablet Take 1 tablet by mouth every morning and take 2 tablets at bedtime (Patient not taking: Reported on 02/06/2021) 270 tablet 0   tiaGABine (GABITRIL) 4 MG tablet Take 8 mg by mouth 2 (two) times daily.     tiaGABine (GABITRIL) 4 MG tablet TAKE 2 TABLETS BY MOUTH TWICE A DAY (Patient not taking: Reported on 02/06/2021) 360 tablet 0   Turmeric  (QC TUMERIC COMPLEX PO) Take by mouth daily.     Facility-Administered Medications Prior to Visit  Medication Dose Route Frequency Provider Last  Rate Last Admin   0.9 %  sodium chloride infusion  500 mL Intravenous Once Nandigam, Eleonore Chiquito, MD        Allergies  Allergen Reactions   Zofran [Ondansetron Hcl] Other (See Comments)    Severe headache    ROS Review of Systems  Constitutional: Negative.   HENT: Negative.    Eyes: Negative.   Respiratory: Negative.    Cardiovascular: Negative.   Gastrointestinal: Negative.   Genitourinary: Negative.   Musculoskeletal: Negative.   Skin: Negative.   Neurological: Negative.   Psychiatric/Behavioral: Negative.    All other systems reviewed and are negative.    Objective:    Physical Exam Vitals and nursing note reviewed.  Constitutional:      General: She is not in acute distress.    Appearance: Normal appearance. She is normal weight. She is not ill-appearing, toxic-appearing or diaphoretic.  Cardiovascular:     Rate and Rhythm: Normal rate and regular rhythm.     Heart sounds: Normal heart sounds. No murmur heard.   No friction rub. No gallop.  Pulmonary:     Effort: Pulmonary effort is normal. No respiratory distress.     Breath sounds: Normal breath sounds. No stridor. No wheezing, rhonchi or rales.  Chest:     Chest wall: No tenderness.  Skin:    General: Skin is warm and dry.  Neurological:     General: No focal deficit present.     Mental Status: She is alert and oriented to person, place, and time. Mental status is at baseline.  Psychiatric:        Mood and Affect: Mood normal.        Behavior: Behavior normal.        Thought Content: Thought content normal.        Judgment: Judgment normal.    BP (!) 159/82   Pulse 70   Temp 98.1 F (36.7 C) (Temporal)   Resp 18   Ht 5\' 8"  (1.727 m)   Wt 279 lb 12.8 oz (126.9 kg)   LMP 01/25/2008   SpO2 99%   BMI 42.54 kg/m  Wt Readings from Last 3 Encounters:   02/06/21 283 lb 3.2 oz (128.5 kg)  01/08/21 279 lb 12.8 oz (126.9 kg)  12/11/20 279 lb 9.6 oz (126.8 kg)     Health Maintenance Due  Topic Date Due   COVID-19 Vaccine (1) Never done   MAMMOGRAM  09/28/2020   INFLUENZA VACCINE  12/24/2020    There are no preventive care reminders to display for this patient.  Lab Results  Component Value Date   TSH 1.20 02/06/2021   Lab Results  Component Value Date   WBC 7.5 02/06/2021   HGB 12.4 02/06/2021   HCT 37.9 02/06/2021   MCV 87.3 02/06/2021   PLT 262.0 02/06/2021   Lab Results  Component Value Date   NA 131 (L) 02/06/2021   K 4.3 02/06/2021   CO2 28 02/06/2021   GLUCOSE 118 (H) 02/06/2021   BUN 8 02/06/2021   CREATININE 0.51 02/06/2021   BILITOT 0.3 02/06/2021   ALKPHOS 107 02/06/2021   AST 17 02/06/2021   ALT 18 02/06/2021   PROT 6.6 02/06/2021   ALBUMIN 3.9 02/06/2021   CALCIUM 9.0 02/06/2021   ANIONGAP 9 12/30/2016   GFR 109.76 02/06/2021   Lab Results  Component Value Date   CHOL 150 02/06/2021   Lab Results  Component Value Date   HDL 60.30 02/06/2021   Lab Results  Component Value Date   LDLCALC 64 02/06/2021   Lab Results  Component Value Date   TRIG 129.0 02/06/2021   Lab Results  Component Value Date   CHOLHDL 2 02/06/2021   Lab Results  Component Value Date   HGBA1C 6.4 (A) 12/11/2020      Assessment & Plan:   Problem List Items Addressed This Visit       Cardiovascular and Mediastinum   Essential hypertension - Primary   Relevant Medications   amLODipine (NORVASC) 5 MG tablet    Meds ordered this encounter  Medications   amLODipine (NORVASC) 5 MG tablet    Sig: Take 1 tablet (5 mg total) by mouth daily.    Dispense:  90 tablet    Refill:  0    Order Specific Question:   Supervising Provider    Answer:   Neva Seat, JEFFREY R [[id number]]    Follow-up: No follow-ups on file.   PLAN Htn well controlled. Continue regimen.  Return as scheduled with PCP Patient encouraged to call  clinic with any questions, comments, or concerns.   Janeece Agee, NP

## 2021-02-18 ENCOUNTER — Telehealth: Payer: 59 | Admitting: Physician Assistant

## 2021-02-18 ENCOUNTER — Other Ambulatory Visit (HOSPITAL_BASED_OUTPATIENT_CLINIC_OR_DEPARTMENT_OTHER): Payer: Self-pay

## 2021-02-18 DIAGNOSIS — M545 Low back pain, unspecified: Secondary | ICD-10-CM

## 2021-02-18 MED ORDER — CYCLOBENZAPRINE HCL 10 MG PO TABS
10.0000 mg | ORAL_TABLET | Freq: Three times a day (TID) | ORAL | 0 refills | Status: DC | PRN
  Filled 2021-02-18: qty 15, 5d supply, fill #0

## 2021-02-18 MED ORDER — NAPROXEN 500 MG PO TABS
500.0000 mg | ORAL_TABLET | Freq: Two times a day (BID) | ORAL | 0 refills | Status: DC
  Filled 2021-02-18: qty 20, 10d supply, fill #0

## 2021-02-18 NOTE — Progress Notes (Signed)
I have spent 5 minutes in review of e-visit questionnaire, review and updating patient chart, medical decision making and response to patient.   Salma Walrond Cody Gerarda Conklin, PA-C    

## 2021-02-18 NOTE — Progress Notes (Signed)

## 2021-02-28 ENCOUNTER — Ambulatory Visit
Admission: RE | Admit: 2021-02-28 | Discharge: 2021-02-28 | Disposition: A | Discharge status: Home / Self Care | Payer: 59 | Source: Ambulatory Visit | Attending: Family Medicine | Admitting: Family Medicine

## 2021-02-28 ENCOUNTER — Other Ambulatory Visit: Payer: Self-pay

## 2021-02-28 DIAGNOSIS — Z1231 Encounter for screening mammogram for malignant neoplasm of breast: Secondary | ICD-10-CM | POA: Diagnosis not present

## 2021-03-01 ENCOUNTER — Other Ambulatory Visit (HOSPITAL_BASED_OUTPATIENT_CLINIC_OR_DEPARTMENT_OTHER): Payer: Self-pay

## 2021-03-06 NOTE — Progress Notes (Signed)
Cardiology Office Note:   Date:  03/07/2021  NAME:  [name]    MRN: [id number] DOB:  March 16, 1971   PCP:  Sheliah Hatch, MD  Cardiologist:  Reatha Harps, MD  Electrophysiologist:  None   Referring MD: Sheliah Hatch, MD   Chief Complaint  Patient presents with   Follow-up   History of Present Illness:   [name] is a 50 y.o. female with a hx of DM, HTN, HLD who presents for follow-up of BP. Had syncope in May due to dehydration. Symptoms of palpitations improved with caffeine reduction. Has had issues with BP. Taken off HCTZ. She presents with her blood pressure log.  Blood pressure values range between 130-150 systolically.  She has been taken off HCTZ due to syncopal episodes.  She reports she is still getting dizzy and lightheaded with standing.  Her A1c has improved to 6.4.  I wonder if these are hypoglycemic episodes.  BP is not low when she feels this way.  She denies any chest pain or trouble breathing.  No further syncopal episodes just sensation of dizziness and lightheadedness.  BP in office 130/80.  She reports her father passed away late 02/02/23.  She also reports significant stress at work.  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  I suspect this is stressful.  She does have sleep apnea.  Apparently she stopped using her mask.  This clearly could be contributing.  She informs me that her salt intake is minimal.  Hopefully this is not an issue.  She has started exercise over the last 2 to 3 weeks.  This will help.  Blood pressure values over the past few days are coming down.  Current regimen is lisinopril 40 mg daily, Norvasc 5 mg daily, Aldactone 25 mg daily.  She takes her blood pressure medications in the morning.  Likely should transition tonight if able.  Problem List 1. Diabetes -A1c 6.4 2. HLD -T chol 150, HDL 60, LDL 64, TG 129 3. BMI 41 4. OSA  Past Medical History: Past Medical History:  Diagnosis Date    Allergy    seasonal allergies   Anemia    hx of   Anxiety    on meds   Asthma    hx of   Bipolar disorder (HCC)    Cataract    bilateral sx   Chicken pox    Complication of anesthesia    oxygen saturation dropped after hysterectomy 2009 at [facility]   Depression    on meds   Diabetes mellitus    on meds   Dyspnea    GERD (gastroesophageal reflux disease)    hx of   Hyperlipidemia    on meds   Hypertension    readings   Hypothyroidism    hx of-LEFT thyroid removed   Irregular heartbeat    saw dr Myrtis [facility] sees -cardiology as needed   Migraine    Sleep apnea    uses cpap    Past Surgical History: Past Surgical History:  Procedure Laterality Date   ANKLE SURGERY Left 1989   BACK SURGERY  1999   CATARACT EXTRACTION, BILATERAL     CESAREAN SECTION     1995   LAPAROSCOPIC ASSISTED VAGINAL HYSTERECTOMY  2009     BSO fibroids, DUB, pelvic pain   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 REPAIR N/A 04/14/2016   Procedure: LAPAROSCOPIC ROUX-EN-Y GASTRIC BYPASS  WITH UPPER ENDOSCOPY;  Surgeon: Sharlet Salina  Hoxworth, MD;  Location: [facility];  Service: General;  Laterality: N/A;   SHOULDER ARTHROSCOPY WITH OPEN ROTATOR CUFF REPAIR Right 01/02/2017   Procedure: Right shoulder arthroscopy, A-subcromial decompression, mini open rotator cuff repair, open distal clavicle resection, biceps tenodesis;  Surgeon: Beverely Low, MD;  Location: [facility] OR;  Service: Orthopedics;  Laterality: Right;   THYROIDECTOMY, PARTIAL Left 2001   UMBILICAL HERNIA REPAIR  2003   WISDOM TOOTH EXTRACTION      Current Medications: Current Meds  Medication Sig   ARIPiprazole (ABILIFY) 10 MG tablet Take 10 mg by mouth daily.    ARIPiprazole (ABILIFY) 10 MG tablet TAKE 1 TABLET BY MOUTH EVERY MORNING   ASPIRIN PO Take 162 mg by mouth daily.   atorvastatin (LIPITOR) 40 MG tablet TAKE 1 TABLET BY MOUTH DAILY   Biotin 49675 MCG TABS Take 1 tablet by mouth daily.   Blood Glucose Monitoring Suppl (FREESTYLE  LITE) DEVI Use to check sugar 2 times daily   Calcium Carbonate-Vit D-Min (CALTRATE 600+D PLUS MINIS PO) Take 1 tablet by mouth 3 (three) times daily.   carbamazepine (TEGRETOL XR) 200 MG 12 hr tablet 1 tablet in the morning, 2 at night   cyclobenzaprine (FLEXERIL) 10 MG tablet Take 1 tablet (10 mg total) by mouth 3 (three) times daily as needed for muscle spasms.   Dulaglutide 1.5 MG/0.5ML SOPN INJECT 1.5MG  INTO THE SKIN ONCE A WEEK   gabapentin (NEURONTIN) 600 MG tablet Take 1 tablet by mouth three times a day   Glucosamine HCl (GLUCOSAMINE PO) Take 3 tablets by mouth daily.   lamoTRIgine (LAMICTAL) 200 MG tablet Take 200 mg by mouth 2 (two) times daily.   lamoTRIgine (LAMICTAL) 200 MG tablet TAKE 1 TABLET BY MOUTH TWICE A DAY   Lancets (FREESTYLE) lancets Use as instructed to check sugar 2 times daily   Loratadine (CLARITIN PO) Take by mouth daily.   metFORMIN (GLUCOPHAGE) 1000 MG tablet Take 1 tablet (1,000 mg total) by mouth daily with supper.   Multiple Vitamin (MULTIVITAMIN) tablet Take 1 tablet by mouth daily.   naproxen (NAPROSYN) 500 MG tablet Take 1 tablet (500 mg total) by mouth 2 (two) times daily with a meal.   TEGRETOL-XR 200 MG 12 hr tablet Take 1 tablet by mouth every morning and take 2 tablets at bedtime   tiaGABine (GABITRIL) 4 MG tablet Take 8 mg by mouth 2 (two) times daily.   tiaGABine (GABITRIL) 4 MG tablet TAKE 2 TABLETS BY MOUTH TWICE A DAY   Turmeric (QC TUMERIC COMPLEX PO) Take by mouth daily.   [DISCONTINUED] amLODipine (NORVASC) 5 MG tablet Take 1 tablet (5 mg total) by mouth daily. (Patient taking differently: Take 5 mg by mouth daily. Pt is now taking 10mg  daily)   [DISCONTINUED] lisinopril (ZESTRIL) 40 MG tablet Take 1 tablet (40 mg total) by mouth daily.   [DISCONTINUED] spironolactone (ALDACTONE) 25 MG tablet Take 1 tablet (25 mg total) by mouth daily.   Current Facility-Administered Medications for the 03/07/21 encounter (Office Visit) with Sande Rives, MD  Medication   0.9 %  sodium chloride infusion     Allergies:    Zofran Frazier Richards hcl]   Social History: Social History   Socioeconomic History   Marital status: Married    Spouse name: Not on file   Number of children: 1   Years of education: Not on file   Highest education level: Not on file  Occupational History   Occupation: nursing  Tobacco Use  Smoking status: Never   Smokeless tobacco: Never  Vaping Use   Vaping Use: Never used  Substance and Sexual Activity   Alcohol use: No    Alcohol/week: 0.0 standard drinks   Drug use: No   Sexual activity: Yes    Partners: Male  Other Topics Concern   Not on file  Social History Narrative   Not on file   Social Determinants of Health   Financial Resource Strain: Not on file  Food Insecurity: Not on file  Transportation Needs: Not on file  Physical Activity: Not on file  Stress: Not on file  Social Connections: Not on file     Family History: The patient's family history includes Anxiety disorder in her mother; Asthma in her father; Colon polyps (age of onset: 29) in her father; Diabetes in her mother; Heart disease in her father and mother; Hyperlipidemia in her mother; Hypertension in her father; Lupus in her father; Mental illness in her paternal grandfather; Stroke in her maternal aunt and paternal grandfather. There is no history of Breast cancer, Colon cancer, Esophageal cancer, Stomach cancer, or Rectal cancer.  ROS:   All other ROS reviewed and negative. Pertinent positives noted in the HPI.     EKGs/Labs/Other Studies Reviewed:   The following studies were personally reviewed by me today:  [facility] 01/22/2020   1. Brief ectopic atrial tachycardia episodes detected (3 episodes in 7 days; longest duration 2.6 seconds).  2. No atrial fibrillation.  3. Rare ectopy.    Recent Labs: 02/06/2021: ALT 18; BUN 8; Creatinine, Ser 0.51; Hemoglobin 12.4; Platelets 262.0; Potassium 4.3; Sodium 131; TSH 1.20    Recent Lipid Panel    Component Value Date/Time   CHOL 150 02/06/2021 1057   TRIG 129.0 02/06/2021 1057   HDL 60.30 02/06/2021 1057   CHOLHDL 2 02/06/2021 1057   VLDL 25.8 02/06/2021 1057   LDLCALC 64 02/06/2021 1057   LDLDIRECT 112.0 06/21/2014 1542    Physical Exam:   VS:  BP 138/80   Pulse 61   Ht 5' 8.5" (1.74 m)   Wt 284 lb (128.8 kg)   LMP 01/25/2008   SpO2 97%   BMI 42.55 kg/m    Wt Readings from Last 3 Encounters:  03/07/21 284 lb (128.8 kg)  02/06/21 283 lb 3.2 oz (128.5 kg)  2021/01/19 279 lb 12.8 oz (126.9 kg)    General: Well nourished, well developed, in no acute distress Head: Atraumatic, normal size  Eyes: PEERLA, EOMI  Neck: Supple, no JVD Endocrine: No thryomegaly Cardiac: Normal S1, S2; RRR; no murmurs, rubs, or gallops Lungs: Clear to auscultation bilaterally, no wheezing, rhonchi or rales  Abd: Soft, nontender, no hepatomegaly  Ext: No edema, pulses 2+ Musculoskeletal: No deformities, BUE and BLE strength normal and equal Skin: Warm and dry, no rashes   Neuro: Alert and oriented to person, place, time, and situation, CNII-XII grossly intact, no focal deficits  Psych: Normal mood and affect   ASSESSMENT:   Deolinda Elwyn Lowden is a 50 y.o. female who presents for the following: 1. Essential hypertension   2. Hyperlipidemia LDL goal <70   3. Palpitations     PLAN:   1. Essential hypertension -Blood pressure has remained elevated.  She stopped using her CPAP machine.  I suspect untreated sleep apnea is contributing.  I also believe she is under a lot of stress at work.  Her father also passed away late 01/20/2023.  These clearly could be contributing to her elevated blood pressures.  She is only started exercise of the last few weeks.  This will help.  I would recommend she continue to exercise.  She will get back on her CPAP machine.  I think to prevent this dizziness and lightheadedness that is occurring I would like for her to take her blood pressure  medications at night.  She will take lisinopril 40 mg and Norvasc 5 mg at night.  She is taking Aldactone.  I would like to reduce the dose.  BP values have been pretty stable over the past few days.  She will reduce this to 12.5 mg daily.  I suspect with treatment of her sleep apnea and continued exercise her blood pressure will improve and she may come off these medications.  Her blood pressure is well controlled last year.  Clearly there has been a change.  I suspect this is likely combination of stress as well as untreated sleep apnea.  2. Hyperlipidemia LDL goal <70 -LDL cholesterol is at goal for diabetic.  Most recent value 64.  Continue Lipitor 40 mg daily.  3. Palpitations -No further symptoms.  This improved with caffeine reduction.  Disposition: Return in about 4 months (around 07/08/2021).  Medication Adjustments/Labs and Tests Ordered: Current medicines are reviewed at length with the patient today.  Concerns regarding medicines are outlined above.  No orders of the defined types were placed in this encounter.  Meds ordered this encounter  Medications   spironolactone (ALDACTONE) 25 MG tablet    Sig: Take 0.5 tablets (12.5 mg total) by mouth daily.    Dispense:  60 tablet    Refill:  2   lisinopril (ZESTRIL) 40 MG tablet    Sig: Take 1 tablet (40 mg total) by mouth daily.    Dispense:  30 tablet    Refill:  6   amLODipine (NORVASC) 5 MG tablet    Sig: Take 1 tablet (5 mg total) by mouth daily.    Dispense:  90 tablet    Refill:  3    Patient Instructions  Medication Instructions:  Decrease Aldactone to 12.5 mg daily- take it in the morning. Take your Lisinopril and Norvasc at night.   *If you need a refill on your cardiac medications before your next appointment, please call your pharmacy*   Follow-Up: At [facility], you and your health needs are our priority.  As part of our continuing mission to provide you with exceptional heart care, we have created  designated Provider Care Teams.  These Care Teams include your primary Cardiologist (physician) and Advanced Practice Providers (APPs -  Physician Assistants and Nurse Practitioners) who all work together to provide you with the care you need, when you need it.  We recommend signing up for the patient portal called "MyChart".  Sign up information is provided on this After Visit Summary.  MyChart is used to connect with patients for Virtual Visits (Telemedicine).  Patients are able to view lab/test results, encounter notes, upcoming appointments, etc.  Non-urgent messages can be sent to your provider as well.   To learn more about what you can do with MyChart, go to [email or web address].    Your next appointment:   4 month(s)  The format for your next appointment:   In Person  Provider:   Lennie Odor, MD   Other Instructions Take your blood pressure once daily.   Time Spent with Patient: I have spent a total of 35 minutes with patient reviewing hospital notes, telemetry, EKGs, labs and examining the  patient as well as establishing an assessment and plan that was discussed with the patient.  > 50% of time was spent in direct patient care.  Signed, Lenna Gilford. Flora Lipps, MD, [facility]  [facility]  [address], [address] [place], [place] [address] [phone]  03/07/2021 10:18 AM

## 2021-03-07 ENCOUNTER — Ambulatory Visit: Payer: 59 | Admitting: Cardiovascular Disease

## 2021-03-07 ENCOUNTER — Other Ambulatory Visit: Payer: Self-pay

## 2021-03-07 ENCOUNTER — Encounter: Payer: Self-pay | Admitting: Cardiovascular Disease

## 2021-03-07 ENCOUNTER — Other Ambulatory Visit (HOSPITAL_BASED_OUTPATIENT_CLINIC_OR_DEPARTMENT_OTHER): Payer: Self-pay

## 2021-03-07 VITALS — BP 138/80 | HR 61 | Ht 68.5 in | Wt 284.0 lb

## 2021-03-07 DIAGNOSIS — I10 Essential (primary) hypertension: Secondary | ICD-10-CM

## 2021-03-07 DIAGNOSIS — R002 Palpitations: Secondary | ICD-10-CM

## 2021-03-07 DIAGNOSIS — E785 Hyperlipidemia, unspecified: Secondary | ICD-10-CM | POA: Diagnosis not present

## 2021-03-07 MED ORDER — SPIRONOLACTONE 25 MG PO TABS
12.5000 mg | ORAL_TABLET | Freq: Every day | ORAL | 2 refills | Status: DC
  Filled 2021-03-07: qty 60, 120d supply, fill #0
  Filled 2021-06-26: qty 45, 90d supply, fill #0

## 2021-03-07 MED ORDER — AMLODIPINE BESYLATE 5 MG PO TABS
5.0000 mg | ORAL_TABLET | Freq: Every day | ORAL | 3 refills | Status: DC
  Filled 2021-03-07 – 2021-03-22 (×4): qty 90, 90d supply, fill #0

## 2021-03-07 MED ORDER — LISINOPRIL 40 MG PO TABS
40.0000 mg | ORAL_TABLET | Freq: Every day | ORAL | 6 refills | Status: DC
  Filled 2021-03-07: qty 30, 30d supply, fill #0

## 2021-03-07 NOTE — Patient Instructions (Signed)
Medication Instructions:  Decrease Aldactone to 12.5 mg daily- take it in the morning. Take your Lisinopril and Norvasc at night.   *If you need a refill on your cardiac medications before your next appointment, please call your pharmacy*   Follow-Up: At [facility], you and your health needs are our priority.  As part of our continuing mission to provide you with exceptional heart care, we have created designated Provider Care Teams.  These Care Teams include your primary Cardiologist (physician) and Advanced Practice Providers (APPs -  Physician Assistants and Nurse Practitioners) who all work together to provide you with the care you need, when you need it.  We recommend signing up for the patient portal called "MyChart".  Sign up information is provided on this After Visit Summary.  MyChart is used to connect with patients for Virtual Visits (Telemedicine).  Patients are able to view lab/test results, encounter notes, upcoming appointments, etc.  Non-urgent messages can be sent to your provider as well.   To learn more about what you can do with MyChart, go to [email or web address].    Your next appointment:   4 month(s)  The format for your next appointment:   In Person  Provider:   Lennie Odor, MD   Other Instructions Take your blood pressure once daily.

## 2021-03-11 ENCOUNTER — Other Ambulatory Visit (HOSPITAL_BASED_OUTPATIENT_CLINIC_OR_DEPARTMENT_OTHER): Payer: Self-pay

## 2021-03-21 ENCOUNTER — Other Ambulatory Visit (HOSPITAL_BASED_OUTPATIENT_CLINIC_OR_DEPARTMENT_OTHER): Payer: Self-pay

## 2021-03-22 ENCOUNTER — Other Ambulatory Visit (HOSPITAL_BASED_OUTPATIENT_CLINIC_OR_DEPARTMENT_OTHER): Payer: Self-pay

## 2021-03-22 ENCOUNTER — Other Ambulatory Visit: Payer: Self-pay | Admitting: Internal Medicine

## 2021-03-22 ENCOUNTER — Other Ambulatory Visit (HOSPITAL_COMMUNITY): Payer: Self-pay

## 2021-03-22 MED ORDER — TRULICITY 1.5 MG/0.5ML ~~LOC~~ SOAJ
SUBCUTANEOUS | 3 refills | Status: DC
  Filled 2021-03-22: qty 6, 84d supply, fill #0
  Filled 2022-02-07: qty 6, 84d supply, fill #1

## 2021-03-23 ENCOUNTER — Other Ambulatory Visit (HOSPITAL_COMMUNITY): Payer: Self-pay

## 2021-04-02 ENCOUNTER — Other Ambulatory Visit (HOSPITAL_BASED_OUTPATIENT_CLINIC_OR_DEPARTMENT_OTHER): Payer: Self-pay

## 2021-04-02 ENCOUNTER — Other Ambulatory Visit: Payer: Self-pay | Admitting: Family Medicine

## 2021-04-02 MED ORDER — ATORVASTATIN CALCIUM 40 MG PO TABS
40.0000 mg | ORAL_TABLET | Freq: Every day | ORAL | 1 refills | Status: DC
  Filled 2021-04-02: qty 90, 90d supply, fill #0
  Filled 2021-07-04: qty 90, 90d supply, fill #1

## 2021-04-08 ENCOUNTER — Ambulatory Visit: Payer: 59 | Admitting: Cardiovascular Disease

## 2021-04-11 ENCOUNTER — Other Ambulatory Visit (HOSPITAL_BASED_OUTPATIENT_CLINIC_OR_DEPARTMENT_OTHER): Payer: Self-pay

## 2021-04-11 ENCOUNTER — Encounter: Payer: Self-pay | Admitting: Cardiovascular Disease

## 2021-04-11 MED ORDER — LISINOPRIL 40 MG PO TABS
40.0000 mg | ORAL_TABLET | Freq: Every day | ORAL | 1 refills | Status: DC
  Filled 2021-04-11: qty 90, 90d supply, fill #0
  Filled 2021-07-12: qty 90, 90d supply, fill #1

## 2021-04-14 ENCOUNTER — Telehealth: Payer: 59 | Admitting: Emergency Medicine

## 2021-04-14 DIAGNOSIS — M62830 Muscle spasm of back: Secondary | ICD-10-CM | POA: Diagnosis not present

## 2021-04-14 DIAGNOSIS — M549 Dorsalgia, unspecified: Secondary | ICD-10-CM

## 2021-04-14 MED ORDER — CYCLOBENZAPRINE HCL 10 MG PO TABS
10.0000 mg | ORAL_TABLET | Freq: Every day | ORAL | 0 refills | Status: DC
  Filled 2021-04-14: qty 30, 30d supply, fill #0

## 2021-04-14 MED ORDER — PREDNISONE 20 MG PO TABS
20.0000 mg | ORAL_TABLET | Freq: Every day | ORAL | 0 refills | Status: AC
  Filled 2021-04-14: qty 5, 5d supply, fill #0

## 2021-04-14 NOTE — Progress Notes (Signed)
I have spent 5 minutes in review of e-visit questionnaire, review and updating patient chart, medical decision making and response to patient.   Jef Futch, PA-C    

## 2021-04-14 NOTE — Progress Notes (Signed)

## 2021-04-15 ENCOUNTER — Other Ambulatory Visit (HOSPITAL_BASED_OUTPATIENT_CLINIC_OR_DEPARTMENT_OTHER): Payer: Self-pay

## 2021-05-06 ENCOUNTER — Other Ambulatory Visit (HOSPITAL_BASED_OUTPATIENT_CLINIC_OR_DEPARTMENT_OTHER): Payer: Self-pay

## 2021-06-03 ENCOUNTER — Other Ambulatory Visit (HOSPITAL_BASED_OUTPATIENT_CLINIC_OR_DEPARTMENT_OTHER): Payer: Self-pay

## 2021-06-03 DIAGNOSIS — M5451 Vertebrogenic low back pain: Secondary | ICD-10-CM | POA: Diagnosis not present

## 2021-06-03 DIAGNOSIS — M546 Pain in thoracic spine: Secondary | ICD-10-CM | POA: Diagnosis not present

## 2021-06-04 ENCOUNTER — Other Ambulatory Visit (HOSPITAL_BASED_OUTPATIENT_CLINIC_OR_DEPARTMENT_OTHER): Payer: Self-pay

## 2021-06-04 MED ORDER — TEGRETOL-XR 200 MG PO TB12
ORAL_TABLET | ORAL | 0 refills | Status: DC
  Filled 2021-06-04: qty 270, 90d supply, fill #0

## 2021-06-05 ENCOUNTER — Other Ambulatory Visit (HOSPITAL_BASED_OUTPATIENT_CLINIC_OR_DEPARTMENT_OTHER): Payer: Self-pay

## 2021-06-13 ENCOUNTER — Other Ambulatory Visit (HOSPITAL_BASED_OUTPATIENT_CLINIC_OR_DEPARTMENT_OTHER): Payer: Self-pay

## 2021-06-13 ENCOUNTER — Encounter: Payer: Self-pay | Admitting: Internal Medicine

## 2021-06-13 ENCOUNTER — Other Ambulatory Visit: Payer: Self-pay

## 2021-06-13 ENCOUNTER — Ambulatory Visit: Payer: 59 | Admitting: Internal Medicine

## 2021-06-13 VITALS — BP 138/82 | HR 66 | Ht 68.0 in | Wt 289.4 lb

## 2021-06-13 DIAGNOSIS — E119 Type 2 diabetes mellitus without complications: Secondary | ICD-10-CM

## 2021-06-13 DIAGNOSIS — E782 Mixed hyperlipidemia: Secondary | ICD-10-CM

## 2021-06-13 LAB — POCT GLYCOSYLATED HEMOGLOBIN (HGB A1C): Hemoglobin A1C: 9.7 % — AB (ref 4.0–5.6)

## 2021-06-13 MED ORDER — METFORMIN HCL 1000 MG PO TABS
1000.0000 mg | ORAL_TABLET | Freq: Two times a day (BID) | ORAL | 3 refills | Status: DC
  Filled 2021-06-13: qty 180, 90d supply, fill #0
  Filled 2021-09-16: qty 180, 90d supply, fill #1
  Filled 2021-12-16: qty 180, 90d supply, fill #2

## 2021-06-13 NOTE — Patient Instructions (Addendum)
Please increase: - Metformin 1000 mg 2x a day  Please restart: - Trulicity 1.5 mg weekly  Please come back for a follow-up appointment in 3 months.

## 2021-06-13 NOTE — Progress Notes (Signed)
Patient ID: [name], female   DOB: Aug 29, 1970, 51 y.o.   MRN: [id number]  This visit occurred during the SARS-CoV-2 public health emergency.  Safety protocols were in place, including screening questions prior to the visit, additional usage of staff PPE, and extensive cleaning of exam room while observing appropriate contact time as indicated for disinfecting solutions.   HPI: [name] is a 51 y.o.-year-old female, presenting for f/u for DM2, dx in 2001, non-insulin-dependent, uncontrolled, without long term complications. Last visit 6 months ago.  Interim history: No increased urination, blurry vision, nausea, chest pain. Her father died of cancer in 12/2020.  She has significant stress around that time.  She is grieving. She did not check sugars since last visit. She was also off Trulicity x2 months as she kept forgetting to take it.  Reviewed HbA1c levels: Lab Results  Component Value Date   HGBA1C 6.4 (A) 12/11/2020   HGBA1C 6.6 (A) 08/16/2020   HGBA1C 8.1 (A) 12/15/2019   HGBA1C 7.6 (H) 07/12/2019   HGBA1C 6.7 (A) 11/30/2018   HGBA1C 8.5 (A) 07/26/2018   HGBA1C 9.9 (H) 04/15/2018   HGBA1C 7.6 08/03/2017   HGBA1C 6.6 (H) 12/30/2016   HGBA1C 6.1 (H) 10/09/2016   HGBA1C 9.6 02/21/2016   HGBA1C 9.2 10/05/2015   HGBA1C 10.4 05/31/2015   HGBA1C 8.8 01/09/2015   HGBA1C 11.0 (H) 06/21/2014   HGBA1C 8.8 (H) 01/03/2014   HGBA1C 9.6 (H) 04/12/2013   HGBA1C 11.0 (H) 11/18/2012   HGBA1C 9.0 (H) 11/12/2011   HGBA1C 9.6 (H) 06/23/2011   Pt was on a regimen of: - Metformin 2000 mg at dinnertime - Lantus 63 units at bedtime - Victoza 1.8 mg daily - Amaryl 4 mg 2x a day She had frequent yeast inf when sugars were higher before.   Pt is now on a regimen of: - Metformin 500 mg 2x a day >> 1000 mg 2x a day -increased in 03/2018 >> 1000 mg with dinner - Trulicity -initially had nausea, then resolved: 1.5 mg weekly  She was checking sugars up to yesterday: 200,  222.(Fasting for 4 hours) - am: 105-140 >> 110-130 >> 110-140 >> 93-143 >> 87-128 - 2h after b'fast: 145-176 >> n/c >> 71-100 >> 83-99 - before lunch: 110-120 >> n/c >> 90, 93 >> 76-102 - 2h after lunch:  106-166 >> n/c >> 84, 192 >> 80-94 - before dinner: 120-150 >> n/c >> 95, 99 >> 85-111 - 2h after dinner: 83-101 >> 100-120 >> n/c  - bedtime:  120-150 >> 170-200 >> 118-160 >> 91-133 - nighttime: n/c Lowest sugar was 34 (after Sx - while still on insulin) >> ... 76 >> ?; she has hypoglycemia awareness in the 90s. Highest sugar was 330 >> ...  160 >> 178 >> 200s.  Glucometer: One Touch Ultra 2 >> FreeStyle Lite   -No CKD, last BUN/creatinine:  Lab Results  Component Value Date   BUN 8 02/06/2021   CREATININE 0.51 02/06/2021  On lisinopril 20.  -+ HL; last set of lipids: Lab Results  Component Value Date   CHOL 150 02/06/2021   HDL 60.30 02/06/2021   LDLCALC 64 02/06/2021   LDLDIRECT 112.0 06/21/2014   TRIG 129.0 02/06/2021   CHOLHDL 2 02/06/2021  On Lipitor 40.  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  She had cataracts but is status post cataract surgery.  Dr. Emily FilbertGould.  She sees a retina specialist.  -No numbness and tingling  in her feet.   She had Roux-en-Y gastric bypass surgery in 03/2016.  After this, she lost more than 100 pounds but she gained more than 50 pounds back afterwards. She had a syncopal episode in [place] in 09/2020, after staying in the sun for 3 hours.  At that time, she went to the emergency room.  She was given IV fluids.  EKG was normal.  HCTZ was stopped and lisinopril was increased.  Next day, she tested positive for COVID.  This was complicated by bronchitis > had ABx. She had a previous syncopal episode in [place] in 03/2020.  ROS: + see HPI  I reviewed pt's medications, allergies, PMH, social hx, family hx, and changes were documented in the history of present illness. Otherwise, unchanged from my initial visit  note.  Past Medical History:  Diagnosis Date   Allergy    seasonal allergies   Anemia    hx of   Anxiety    on meds   Asthma    hx of   Bipolar disorder (HCC)    Cataract    bilateral sx   Chicken pox    Complication of anesthesia    oxygen saturation dropped after hysterectomy 2009 at [facility]   Depression    on meds   Diabetes mellitus    on meds   Dyspnea    GERD (gastroesophageal reflux disease)    hx of   Hyperlipidemia    on meds   Hypertension    readings   Hypothyroidism    hx of-LEFT thyroid removed   Irregular heartbeat    saw dr Myrtis Ser sees -cardiology as needed   Migraine    Sleep apnea    uses cpap   Past Surgical History:  Procedure Laterality Date   ANKLE SURGERY Left 1989   BACK SURGERY  1999   CATARACT EXTRACTION, BILATERAL     CESAREAN SECTION     1995   LAPAROSCOPIC ASSISTED VAGINAL HYSTERECTOMY  2009     BSO fibroids, DUB, pelvic pain   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 REPAIR N/A 04/14/2016   Procedure: LAPAROSCOPIC ROUX-EN-Y GASTRIC BYPASS  WITH UPPER ENDOSCOPY;  Surgeon: Glenna Fellows, MD;  Location: [facility] ORS;  Service: General;  Laterality: N/A;   SHOULDER ARTHROSCOPY WITH OPEN ROTATOR CUFF REPAIR Right 01/02/2017   Procedure: Right shoulder arthroscopy, A-subcromial decompression, mini open rotator cuff repair, open distal clavicle resection, biceps tenodesis;  Surgeon: Beverely Low, MD;  Location: [facility] OR;  Service: Orthopedics;  Laterality: Right;   THYROIDECTOMY, PARTIAL Left 2001   UMBILICAL HERNIA REPAIR  2003   WISDOM TOOTH EXTRACTION     Social History   Social History   Marital Status: Married    Spouse Name: N/A   Number of Children: 1   Occupational History   Passenger transport manager, Administrator   Social History Main Topics   Smoking status: Never Smoker    Smokeless tobacco: Never Used   Alcohol Use: No   Drug Use: No   Current Outpatient Medications on File Prior to Visit  Medication Sig Dispense  Refill   amLODipine (NORVASC) 5 MG tablet Take 1 tablet (5 mg total) by mouth daily. 90 tablet 3   ARIPiprazole (ABILIFY) 10 MG tablet Take 10 mg by mouth daily.   1   ARIPiprazole (ABILIFY) 10 MG tablet TAKE 1 TABLET BY MOUTH EVERY MORNING 90 tablet 1   ASPIRIN PO Take 162 mg by mouth daily.     atorvastatin (LIPITOR)  40 MG tablet Take 1 tablet (40 mg total) by mouth daily. 90 tablet 1   Biotin 19379 MCG TABS Take 1 tablet by mouth daily.     Blood Glucose Monitoring Suppl (FREESTYLE LITE) DEVI Use to check sugar 2 times daily 1 each 0   Calcium Carbonate-Vit D-Min (CALTRATE 600+D PLUS MINIS PO) Take 1 tablet by mouth 3 (three) times daily.     carbamazepine (TEGRETOL XR) 200 MG 12 hr tablet 1 tablet in the morning, 2 at night     cyclobenzaprine (FLEXERIL) 10 MG tablet Take 1 tablet (10 mg total) by mouth at bedtime. DO NOT TAKE PRIOR TO DRIVING OR OPERATING HEAVY MACHINERY AS THIS MEDICATION MAY MAKE YOU DROWSY 30 tablet 0   Dulaglutide (TRULICITY) 1.5 MG/0.5ML SOPN INJECT 1.5MG  INTO THE SKIN ONCE A WEEK 6 mL 3   gabapentin (NEURONTIN) 600 MG tablet Take 1 tablet by mouth three times a day 270 tablet 1   Glucosamine HCl (GLUCOSAMINE PO) Take 3 tablets by mouth daily.     lamoTRIgine (LAMICTAL) 200 MG tablet Take 200 mg by mouth 2 (two) times daily.     lamoTRIgine (LAMICTAL) 200 MG tablet TAKE 1 TABLET BY MOUTH TWICE A DAY 180 tablet 1   Lancets (FREESTYLE) lancets Use as instructed to check sugar 2 times daily 200 each 5   lisinopril (ZESTRIL) 40 MG tablet Take 1 tablet (40 mg total) by mouth daily. 90 tablet 1   Loratadine (CLARITIN PO) Take by mouth daily.     metFORMIN (GLUCOPHAGE) 1000 MG tablet Take 1 tablet (1,000 mg total) by mouth daily with supper. 90 tablet 3   Multiple Vitamin (MULTIVITAMIN) tablet Take 1 tablet by mouth daily.     naproxen (NAPROSYN) 500 MG tablet Take 1 tablet (500 mg total) by mouth 2 (two) times daily with a meal. 20 tablet 0   spironolactone (ALDACTONE) 25  MG tablet Take 0.5 tablets (12.5 mg total) by mouth daily. 60 tablet 2   TEGRETOL-XR 200 MG 12 hr tablet Take 1 tablet by mouth every morning and take 2 tablets at bedtime 270 tablet 0   tiaGABine (GABITRIL) 4 MG tablet Take 8 mg by mouth 2 (two) times daily.     tiaGABine (GABITRIL) 4 MG tablet TAKE 2 TABLETS BY MOUTH TWICE A DAY 360 tablet 0   Turmeric (QC TUMERIC COMPLEX PO) Take by mouth daily.     Current Facility-Administered Medications on File Prior to Visit  Medication Dose Route Frequency Provider Last Rate Last Admin   0.9 %  sodium chloride infusion  500 mL Intravenous Once Nandigam, Eleonore Chiquito, MD       Allergies  Allergen Reactions   Zofran [Ondansetron Hcl] Other (See Comments)    Severe headache   Family History  Problem Relation Age of Onset   Hyperlipidemia Mother    Diabetes Mother    Anxiety disorder Mother    Heart disease Mother    Hypertension Father    Lupus Father    Heart disease Father    Asthma Father    Colon polyps Father 86   Stroke Maternal Aunt    Stroke Paternal Grandfather    Mental illness Paternal Grandfather    Breast cancer Neg Hx    Colon cancer Neg Hx    Esophageal cancer Neg Hx    Stomach cancer Neg Hx    Rectal cancer Neg Hx    PE: BP 138/82 (BP Location: Left Arm, Patient Position: Sitting, Cuff Size:  Normal)    Pulse 66    Ht 5\' 8"  (1.727 m)    Wt 289 lb 6.4 oz (131.3 kg)    LMP 01/25/2008    SpO2 96%    BMI 44.00 kg/m  Wt Readings from Last 3 Encounters:  06/13/21 289 lb 6.4 oz (131.3 kg)  03/07/21 284 lb (128.8 kg)  02/06/21 283 lb 3.2 oz (128.5 kg)   Constitutional: overweight, in NAD Eyes: PERRLA, EOMI, no exophthalmos ENT: moist mucous membranes, no thyromegaly, no cervical lymphadenopathy Cardiovascular: RRR, No MRG Respiratory: CTA B Musculoskeletal: no deformities, strength intact in all 4 Skin: moist, warm, no rashes Neurological: no tremor with outstretched hands, DTR normal in all 4  ASSESSMENT: 1. DM2,  insulin-independent, uncontrolled, without long-term complications  2. Obesity class III  3. HL  PLAN:  1. Patient with longstanding, previously uncontrolled type 2 diabetes, on a complex antidiabetic regimen in the past, but with improvement of control after gastric bypass surgery in 2017 and losing more than 100 pounds.  She gained weight back afterwards and sugars worsened, with an HbA1c of 9.9%.  At that time, we increased metformin and added Trulicity, after which her sugars started to improve again.  At last visit, they were excellent so we started to decrease her metformin dose.  We continued Trulicity for both diabetes and weight control.  HbA1c at that time was 6.4%. -At today's visit, patient tells me that she did not check blood sugars since last visit.  She only checked them yesterday after not eating for 4 hours and they were in the 200s.  HbA1c today's much higher than before (see below).  Upon questioning, she was not taking Trulicity for the last 2 months.  She is tolerating it well, but she initially kept forgetting it and then continued without it. -At this visit, we discussed about restarting Trulicity and I also advised her to increase metformin back to 1000 mg twice a day. -We also discussed about the importance of exercise.  She just restarted walking when weather permits, along with her husband.  I advised her that she needs to do more than this, walking inside on the treadmill if she cannot walk outside, and also going to the gym.  She is planning to start this. - I advised her to:  Patient Instructions  Please increase: - Metformin 1000 mg 2x a day  Please restart: - Trulicity 1.5 mg weekly  Please come back for a follow-up appointment in 3 months.  - we checked her HbA1c: 9.7% (much higher) - advised to check sugars at different times of the day - 1x a day, rotating check times - advised for yearly eye exams >> she is UTD - return to clinic in 3 months     2.  Obesity class III -She has a history of Roux-en-Y gastric bypass surgery, after which she lost a significant amount of weight but she started to gain weight back afterwards.  At last visit, weight was stable.  Since last visit, she gained 5 pounds. -We will restart Trulicity which should also help with weight loss -We discussed about increasing exercise  3. HL -Reviewed latest lipid panel from 01/2021: LDL at goal as are the rest of the fractions: Lab Results  Component Value Date   CHOL 150 02/06/2021   HDL 60.30 02/06/2021   LDLCALC 64 02/06/2021   LDLDIRECT 112.0 06/21/2014   TRIG 129.0 02/06/2021   CHOLHDL 2 02/06/2021  -Continues Lipitor 40 mg daily without side  effects  Carlus Pavlovristina Willaim Mode, MD PhD [facility]

## 2021-06-18 DIAGNOSIS — F3132 Bipolar disorder, current episode depressed, moderate: Secondary | ICD-10-CM | POA: Diagnosis not present

## 2021-06-18 DIAGNOSIS — F411 Generalized anxiety disorder: Secondary | ICD-10-CM | POA: Diagnosis not present

## 2021-06-20 ENCOUNTER — Encounter: Payer: Self-pay | Admitting: Family Medicine

## 2021-06-20 ENCOUNTER — Other Ambulatory Visit (HOSPITAL_BASED_OUTPATIENT_CLINIC_OR_DEPARTMENT_OTHER): Payer: Self-pay

## 2021-06-20 ENCOUNTER — Ambulatory Visit: Payer: 59 | Admitting: Family Medicine

## 2021-06-20 VITALS — BP 126/80 | HR 59 | Temp 97.9°F | Resp 16 | Wt 291.4 lb

## 2021-06-20 DIAGNOSIS — R52 Pain, unspecified: Secondary | ICD-10-CM | POA: Diagnosis not present

## 2021-06-20 DIAGNOSIS — G4762 Sleep related leg cramps: Secondary | ICD-10-CM | POA: Diagnosis not present

## 2021-06-20 MED ORDER — GABAPENTIN 600 MG PO TABS
600.0000 mg | ORAL_TABLET | Freq: Three times a day (TID) | ORAL | 1 refills | Status: DC
  Filled 2021-06-20 – 2021-08-22 (×2): qty 270, 90d supply, fill #0

## 2021-06-20 MED ORDER — TIAGABINE HCL 16 MG PO TABS
8.0000 mg | ORAL_TABLET | Freq: Two times a day (BID) | ORAL | 0 refills | Status: DC
  Filled 2021-06-20: qty 360, 90d supply, fill #0
  Filled 2021-06-20: qty 90, 90d supply, fill #0

## 2021-06-20 MED ORDER — LAMOTRIGINE 200 MG PO TABS
200.0000 mg | ORAL_TABLET | Freq: Two times a day (BID) | ORAL | 1 refills | Status: DC
  Filled 2021-06-20: qty 180, 90d supply, fill #0

## 2021-06-20 MED ORDER — TEGRETOL-XR 200 MG PO TB12
ORAL_TABLET | ORAL | 0 refills | Status: DC
  Filled 2021-06-20 – 2021-09-16 (×2): qty 270, 90d supply, fill #0

## 2021-06-20 MED ORDER — PREDNISONE 10 MG PO TABS
ORAL_TABLET | ORAL | 0 refills | Status: DC
  Filled 2021-06-20: qty 18, 9d supply, fill #0

## 2021-06-20 MED ORDER — ARIPIPRAZOLE 10 MG PO TABS
10.0000 mg | ORAL_TABLET | Freq: Every morning | ORAL | 1 refills | Status: DC
  Filled 2021-06-20 – 2021-08-14 (×2): qty 90, 90d supply, fill #0

## 2021-06-20 NOTE — Progress Notes (Signed)
° °  Subjective:    Patient ID: [name], female    DOB: 15-Dec-1970, 51 y.o.   MRN: [id number]  HPI Leg cramps- bilateral.  Sxs started ~6 weeks ago.  Pain will start at top of thigh and radiate down to toes.  Doesn't always start at thighs- can start at knees or ankles- depends.  Pt notes sxs are worse at night or w/ feet up in recliner.  Reports a burning tingling.  Occurring daily.  No difficulty w/ walking or standing.  Drinks 80oz water/day.  No changes to bowel or bladder habits.  Started a walking regimen after pain developed and this has provided some improvement.  Pain is a 12/10.  Pt denies current back pain- saw [facility] and had imaging done.   Review of Systems For ROS see HPI   This visit occurred during the SARS-CoV-2 public health emergency.  Safety protocols were in place, including screening questions prior to the visit, additional usage of staff PPE, and extensive cleaning of exam room while observing appropriate contact time as indicated for disinfecting solutions.      Objective:   Physical Exam Vitals reviewed.  Constitutional:      General: She is not in acute distress.    Appearance: Normal appearance. She is obese. She is not ill-appearing.  HENT:     Head: Normocephalic and atraumatic.  Abdominal:     Tenderness: There is no right CVA tenderness or left CVA tenderness.  Musculoskeletal:        General: No tenderness (no TTP over lumbar spine).  Skin:    General: Skin is warm and dry.  Neurological:     General: No focal deficit present.     Mental Status: She is alert and oriented to person, place, and time.     Motor: No weakness.     Gait: Gait normal.     Comments: (-) SLR bilaterally  Psychiatric:        Mood and Affect: Mood normal.        Behavior: Behavior normal.        Thought Content: Thought content normal.          Assessment & Plan:  Nocturnal leg cramps/burning pain- new.  Unclear if these are the same issue or 2 separate  situations.  Given that sxs only occur at night when lying flat or when legs are extended in the recliner, there definitely seems to be a positional component.  This makes me think lumbar/sacral issue.  Will start Prednisone taper to see if sxs resolve.  Will also check K+/Mg, B12/folate, CBC, TSH to r/o metabolic cause of sxs.  She already drinks large amounts of water- no need to increase.  If no improvement w/ Prednisone and labs unrevealing, will get imaging of L spine.  Pt expressed understanding and is in agreement w/ plan.

## 2021-06-20 NOTE — Patient Instructions (Signed)
Follow up as needed or as scheduled We'll notify you of your lab results and make any changes if needed START the Prednisone as directed- take w/ food (this will likely cause sugars to climb- be mindful of what you eat) If no improvement in cramps/pain- please let me know!! Call with any questions or concerns Stay Safe!  Stay Healthy!! Sherri Rad in there!!!

## 2021-06-21 ENCOUNTER — Other Ambulatory Visit (HOSPITAL_BASED_OUTPATIENT_CLINIC_OR_DEPARTMENT_OTHER): Payer: Self-pay

## 2021-06-21 LAB — HEPATIC FUNCTION PANEL
ALT: 18 U/L (ref 0–35)
AST: 20 U/L (ref 0–37)
Albumin: 4 g/dL (ref 3.5–5.2)
Alkaline Phosphatase: 96 U/L (ref 39–117)
Bilirubin, Direct: 0.1 mg/dL (ref 0.0–0.3)
Total Bilirubin: 0.3 mg/dL (ref 0.2–1.2)
Total Protein: 6.4 g/dL (ref 6.0–8.3)

## 2021-06-21 LAB — CBC WITH DIFFERENTIAL/PLATELET
Basophils Absolute: 0.1 10*3/uL (ref 0.0–0.1)
Basophils Relative: 0.9 % (ref 0.0–3.0)
Eosinophils Absolute: 0.7 10*3/uL (ref 0.0–0.7)
Eosinophils Relative: 8.9 % — ABNORMAL HIGH (ref 0.0–5.0)
HCT: 35.2 % — ABNORMAL LOW (ref 36.0–46.0)
Hemoglobin: 11.7 g/dL — ABNORMAL LOW (ref 12.0–15.0)
Lymphocytes Relative: 29.1 % (ref 12.0–46.0)
Lymphs Abs: 2.3 10*3/uL (ref 0.7–4.0)
MCHC: 33.2 g/dL (ref 30.0–36.0)
MCV: 87.5 fl (ref 78.0–100.0)
Monocytes Absolute: 0.7 10*3/uL (ref 0.1–1.0)
Monocytes Relative: 8.4 % (ref 3.0–12.0)
Neutro Abs: 4.2 10*3/uL (ref 1.4–7.7)
Neutrophils Relative %: 52.7 % (ref 43.0–77.0)
Platelets: 305 10*3/uL (ref 150.0–400.0)
RBC: 4.02 Mil/uL (ref 3.87–5.11)
RDW: 12.8 % (ref 11.5–15.5)
WBC: 8 10*3/uL (ref 4.0–10.5)

## 2021-06-21 LAB — BASIC METABOLIC PANEL
BUN: 11 mg/dL (ref 6–23)
CO2: 28 mEq/L (ref 19–32)
Calcium: 9.1 mg/dL (ref 8.4–10.5)
Chloride: 91 mEq/L — ABNORMAL LOW (ref 96–112)
Creatinine, Ser: 0.55 mg/dL (ref 0.40–1.20)
GFR: 107.5 mL/min (ref >60.00)
Glucose, Bld: 102 mg/dL — ABNORMAL HIGH (ref 70–99)
Potassium: 5.4 mEq/L — ABNORMAL HIGH (ref 3.5–5.1)
Sodium: 128 mEq/L — ABNORMAL LOW (ref 135–145)

## 2021-06-21 LAB — B12 AND FOLATE PANEL
Folate: >24.2 ng/mL (ref >5.9)
Vitamin B-12: 343 pg/mL (ref 211–911)

## 2021-06-21 LAB — TSH: TSH: 1.24 u[IU]/mL (ref 0.35–5.50)

## 2021-06-21 LAB — MAGNESIUM: Magnesium: 1.5 mg/dL (ref 1.5–2.5)

## 2021-06-24 ENCOUNTER — Other Ambulatory Visit (HOSPITAL_BASED_OUTPATIENT_CLINIC_OR_DEPARTMENT_OTHER): Payer: Self-pay

## 2021-06-25 ENCOUNTER — Other Ambulatory Visit (HOSPITAL_BASED_OUTPATIENT_CLINIC_OR_DEPARTMENT_OTHER): Payer: Self-pay

## 2021-06-26 ENCOUNTER — Telehealth: Payer: Self-pay | Admitting: Family Medicine

## 2021-06-26 ENCOUNTER — Other Ambulatory Visit (HOSPITAL_BASED_OUTPATIENT_CLINIC_OR_DEPARTMENT_OTHER): Payer: Self-pay

## 2021-06-26 DIAGNOSIS — E871 Hypo-osmolality and hyponatremia: Secondary | ICD-10-CM

## 2021-06-26 NOTE — Telephone Encounter (Signed)
Please place orders for a BMP

## 2021-06-26 NOTE — Telephone Encounter (Signed)
Ordered, please schedule lab only visit 1 week

## 2021-06-28 ENCOUNTER — Other Ambulatory Visit (INDEPENDENT_AMBULATORY_CARE_PROVIDER_SITE_OTHER): Payer: 59

## 2021-06-28 DIAGNOSIS — E871 Hypo-osmolality and hyponatremia: Secondary | ICD-10-CM

## 2021-06-28 LAB — BASIC METABOLIC PANEL
BUN: 17 mg/dL (ref 6–23)
CO2: 31 mEq/L (ref 19–32)
Calcium: 9.2 mg/dL (ref 8.4–10.5)
Chloride: 96 mEq/L (ref 96–112)
Creatinine, Ser: 0.66 mg/dL (ref 0.40–1.20)
GFR: 102.86 mL/min (ref >60.00)
Glucose, Bld: 283 mg/dL — ABNORMAL HIGH (ref 70–99)
Potassium: 5.1 mEq/L (ref 3.5–5.1)
Sodium: 131 mEq/L — ABNORMAL LOW (ref 135–145)

## 2021-06-30 ENCOUNTER — Encounter: Payer: Self-pay | Admitting: Family Medicine

## 2021-07-01 ENCOUNTER — Other Ambulatory Visit: Payer: Self-pay | Admitting: Internal Medicine

## 2021-07-01 ENCOUNTER — Other Ambulatory Visit (HOSPITAL_BASED_OUTPATIENT_CLINIC_OR_DEPARTMENT_OTHER): Payer: Self-pay

## 2021-07-01 MED ORDER — FREESTYLE LANCETS MISC
0 refills | Status: DC
  Filled 2021-07-01: qty 100, 50d supply, fill #0

## 2021-07-01 MED ORDER — FREESTYLE LITE W/DEVICE KIT
PACK | 0 refills | Status: DC
  Filled 2021-07-01 (×2): qty 1, 30d supply, fill #0

## 2021-07-01 MED ORDER — FREESTYLE LITE TEST VI STRP
ORAL_STRIP | 0 refills | Status: DC
  Filled 2021-07-01: qty 100, 50d supply, fill #0

## 2021-07-05 ENCOUNTER — Other Ambulatory Visit (HOSPITAL_BASED_OUTPATIENT_CLINIC_OR_DEPARTMENT_OTHER): Payer: Self-pay

## 2021-07-07 NOTE — Progress Notes (Signed)
Cardiology Office Note:   Date:  07/08/2021  NAME:  [name]    MRN: [id number] DOB:  04-Apr-1971   PCP:  Midge Minium, MD  Cardiologist:  Evalina Field, MD  Electrophysiologist:  None   Referring MD: Midge Minium, MD   Chief Complaint  Patient presents with   Follow-up         History of Present Illness:   [name] is a 51 y.o. female with a hx of HTN, HLD, palpitations who presents for follow-up. Seen in October for increased BP. She had stopped her CPAP machine.  She reports she has not been noncompliant with her CPAP machine.  Also has let her diet go.  Apparently her father died recently.  He did suffer from cancer.  Most recent A1c 9.7.  LDL cholesterol at goal.  Blood pressure slightly elevated today in office 140/95.  Apparently values are better controlled at home.  She is currently on lisinopril 40 mg daily as well as Aldactone 12.5 mg daily.  BP was a bit too low with amlodipine.  She is watching the salt.  She has gained 5 to 10 pounds.  I believe this is contributing.  We discussed conservative ways to lower her blood pressure.  She will work on these.  Denies any chest pain or trouble breathing.  Having infrequent palpitations.  Problem List 1. Diabetes -A1c 9.7 2. HLD -T chol 150, HDL 60, LDL 64, TG 129 3. BMI 41 -s/p gastric bypass 2017 4. OSA 5. HTN  Past Medical History: Past Medical History:  Diagnosis Date   Allergy    seasonal allergies   Anemia    hx of   Anxiety    on meds   Asthma    hx of   Bipolar disorder ([place])    Cataract    bilateral sx   Chicken pox    Complication of anesthesia    oxygen saturation dropped after hysterectomy 2009 at [facility]   Depression    on meds   Diabetes mellitus    on meds   Dyspnea    GERD (gastroesophageal reflux disease)    hx of   Hyperlipidemia    on meds   Hypertension    readings   Hypothyroidism    hx of-LEFT thyroid removed   Irregular heartbeat    saw dr Ron Parker  sees -cardiology as needed   Migraine    Sleep apnea    uses cpap    Past Surgical History: Past Surgical History:  Procedure Laterality Date   ANKLE SURGERY Left [place] VAGINAL HYSTERECTOMY  2009     BSO fibroids, DUB, pelvic pain   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 REPAIR N/A 04/14/2016   Procedure: LAPAROSCOPIC ROUX-EN-Y GASTRIC BYPASS  WITH UPPER ENDOSCOPY;  Surgeon: Excell Seltzer, MD;  Location: [facility];  Service: General;  Laterality: N/A;   SHOULDER ARTHROSCOPY WITH OPEN ROTATOR CUFF REPAIR Right 01/02/2017   Procedure: Right shoulder arthroscopy, A-subcromial decompression, mini open rotator cuff repair, open distal clavicle resection, biceps tenodesis;  Surgeon: Netta Cedars, MD;  Location: [facility];  Service: Orthopedics;  Laterality: Right;   THYROIDECTOMY, PARTIAL Left 5003   UMBILICAL HERNIA REPAIR  2003   WISDOM TOOTH EXTRACTION      Current Medications: Current Meds  Medication Sig  ARIPiprazole (ABILIFY) 10 MG tablet Take 10 mg by mouth daily.    ARIPiprazole (ABILIFY) 10 MG tablet TAKE 1 TABLET BY MOUTH EVERY MORNING   ASPIRIN PO Take 162 mg by mouth daily.   atorvastatin (LIPITOR) 40 MG tablet Take 1 tablet (40 mg total) by mouth daily.   Biotin 10000 MCG TABS Take 1 tablet by mouth daily.   Blood Glucose Monitoring Suppl (FREESTYLE LITE) w/Device KIT Use to check blood sugar 2 times daily   Calcium Carbonate-Vit D-Min (CALTRATE 600+D PLUS MINIS PO) Take 1 tablet by mouth 3 (three) times daily.   carbamazepine (TEGRETOL XR) 200 MG 12 hr tablet 1 tablet in the morning, 2 at night   cyclobenzaprine (FLEXERIL) 10 MG tablet Take 1 tablet (10 mg total) by mouth at bedtime. DO NOT TAKE PRIOR TO DRIVING OR OPERATING HEAVY MACHINERY AS THIS MEDICATION MAY MAKE YOU DROWSY   Dulaglutide (TRULICITY) 1.5 OV/5.6EP SOPN INJECT 1.5MG INTO THE SKIN  ONCE A WEEK   gabapentin (NEURONTIN) 600 MG tablet Take 1 tablet by mouth three times a day   Glucosamine HCl (GLUCOSAMINE PO) Take 3 tablets by mouth daily.   glucose blood (FREESTYLE LITE) test strip Use to check blood sugar 2 times daily   lamoTRIgine (LAMICTAL) 200 MG tablet Take 200 mg by mouth 2 (two) times daily.   lamoTRIgine (LAMICTAL) 200 MG tablet TAKE 1 TABLET BY MOUTH TWICE A DAY   Lancets (FREESTYLE) lancets Use as instructed to check sugar 2 times daily   Lancets (FREESTYLE) lancets Use to check blood sugar 2 times daily   lisinopril (ZESTRIL) 40 MG tablet Take 1 tablet (40 mg total) by mouth daily.   Loratadine (CLARITIN PO) Take by mouth daily.   metFORMIN (GLUCOPHAGE) 1000 MG tablet Take 1 tablet (1,000 mg total) by mouth 2 (two) times daily with a meal.   Multiple Vitamin (MULTIVITAMIN) tablet Take 1 tablet by mouth daily.   naproxen (NAPROSYN) 500 MG tablet Take 1 tablet (500 mg total) by mouth 2 (two) times daily with a meal.   spironolactone (ALDACTONE) 25 MG tablet Take 1/2 tablet (12.5 mg total) by mouth daily.   TEGRETOL-XR 200 MG 12 hr tablet Take 1 tablet by mouth every morning and take 2 tablets at bedtime   tiagabine (GABITRIL) 16 MG tablet Take one-half tablet (8 mg total) by mouth 2 (two) times daily.   tiaGABine (GABITRIL) 4 MG tablet Take 8 mg by mouth 2 (two) times daily.   Turmeric (QC TUMERIC COMPLEX PO) Take by mouth daily.   [DISCONTINUED] predniSONE (DELTASONE) 10 MG tablet Take 3 tablets by mouth daily for 3 days, and then 2 tablets daily for 3 days, and then 1 tablet daily for 3 days.  Take with food.   Current Facility-Administered Medications for the 07/08/21 encounter (Office Visit) with Geralynn Rile, MD  Medication   0.9 %  sodium chloride infusion     Allergies:    Zofran Alvis Lemmings hcl]   Social History: Social History   Socioeconomic History   Marital status: Married    Spouse name: Not on file   Number of children: 1    Years of education: Not on file   Highest education level: Not on file  Occupational History   Occupation: nursing  Tobacco Use   Smoking status: Never   Smokeless tobacco: Never  Vaping Use   Vaping Use: Never used  Substance and Sexual Activity   Alcohol use: No    Alcohol/week: 0.0 standard  drinks   Drug use: No   Sexual activity: Yes    Partners: Male  Other Topics Concern   Not on file  Social History Narrative   Not on file   Social Determinants of Health   Financial Resource Strain: Not on file  Food Insecurity: Not on file  Transportation Needs: Not on file  Physical Activity: Not on file  Stress: Not on file  Social Connections: Not on file     Family History: The patient's family history includes Anxiety disorder in her mother; Asthma in her father; Colon polyps (age of onset: 58) in her father; Diabetes in her mother; Heart disease in her father and mother; Hyperlipidemia in her mother; Hypertension in her father; Lupus in her father; Mental illness in her paternal grandfather; Stroke in her maternal aunt and paternal grandfather. There is no history of Breast cancer, Colon cancer, Esophageal cancer, Stomach cancer, or Rectal cancer.  ROS:   All other ROS reviewed and negative. Pertinent positives noted in the HPI.     EKGs/Labs/Other Studies Reviewed:   The following studies were personally reviewed by me today:  Recent Labs: 06/20/2021: ALT 18; Hemoglobin 11.7; Magnesium 1.5; Platelets 305.0; TSH 1.24 06/28/2021: BUN 17; Creatinine, Ser 0.66; Potassium 5.1; Sodium 131   Recent Lipid Panel    Component Value Date/Time   CHOL 150 02/06/2021 1057   TRIG 129.0 02/06/2021 1057   HDL 60.30 02/06/2021 1057   CHOLHDL 2 02/06/2021 1057   VLDL 25.8 02/06/2021 1057   LDLCALC 64 02/06/2021 1057   LDLDIRECT 112.0 06/21/2014 1542    Physical Exam:   VS:  BP (!) 140/95    Pulse 67    Ht '5\' 8"'  (1.727 m)    Wt 292 lb (132.5 kg)    LMP 01/25/2008    SpO2 96%    BMI  44.40 kg/m    Wt Readings from Last 3 Encounters:  07/08/21 292 lb (132.5 kg)  06/20/21 291 lb 6.4 oz (132.2 kg)  06/13/21 289 lb 6.4 oz (131.3 kg)    General: Well nourished, well developed, in no acute distress Head: Atraumatic, normal size  Eyes: PEERLA, EOMI  Neck: Supple, no JVD Endocrine: No thryomegaly Cardiac: Normal S1, S2; RRR; no murmurs, rubs, or gallops Lungs: Clear to auscultation bilaterally, no wheezing, rhonchi or rales  Abd: Soft, nontender, no hepatomegaly  Ext: No edema, pulses 2+ Musculoskeletal: No deformities, BUE and BLE strength normal and equal Skin: Warm and dry, no rashes   Neuro: Alert and oriented to person, place, time, and situation, CNII-XII grossly intact, no focal deficits  Psych: Normal mood and affect   ASSESSMENT:   [name] is a 51 y.o. female who presents for the following: 1. Essential hypertension   2. Hyperlipidemia LDL goal <70   3. Palpitations   4. Obesity, morbid, BMI 40.0-49.9 ([place])     PLAN:   1. Essential hypertension -Elevated blood pressure.  Likely related to poor diet, obesity and infrequent use of CPAP machine.  She will work on diet and exercise.  This will help her.  For now she will continue lisinopril 40 mg daily and Aldactone 12.5 mg daily.  Blood pressure was too low on amlodipine.  I believe she can likely treat this conservatively.  She is going through bereavement to the loss of her father.  Hopefully this will improve in the next few weeks to months.  2. Hyperlipidemia LDL goal <70 -LDL cholesterol at goal.  Continue  3.  Palpitations -Infrequent symptoms.  Triggered by caffeine use.  No significant arrhythmias on monitor.  4. Obesity, morbid, BMI 40.0-49.9 ([place]) -Diet and exercise recommended.   Disposition: Return in about 1 year (around 07/08/2022).  Medication Adjustments/Labs and Tests Ordered: Current medicines are reviewed at length with the patient today.  Concerns regarding medicines are  outlined above.  No orders of the defined types were placed in this encounter.  No orders of the defined types were placed in this encounter.   Patient Instructions  Medication Instructions:  The current medical regimen is effective;  continue present plan and medications.  *If you need a refill on your cardiac medications before your next appointment, please call your pharmacy*   Follow-Up: At [facility], you and your health needs are our priority.  As part of our continuing mission to provide you with exceptional heart care, we have created designated Provider Care Teams.  These Care Teams include your primary Cardiologist (physician) and Advanced Practice Providers (APPs -  Physician Assistants and Nurse Practitioners) who all work together to provide you with the care you need, when you need it.  We recommend signing up for the patient portal called "MyChart".  Sign up information is provided on this After Visit Summary.  MyChart is used to connect with patients for Virtual Visits (Telemedicine).  Patients are able to view lab/test results, encounter notes, upcoming appointments, etc.  Non-urgent messages can be sent to your provider as well.   To learn more about what you can do with MyChart, go to [email or web address].    Your next appointment:   12 month(s)  The format for your next appointment:   In Person  Provider:   Evalina Field, MD        Time Spent with Patient: I have spent a total of 35 minutes with patient reviewing hospital notes, telemetry, EKGs, labs and examining the patient as well as establishing an assessment and plan that was discussed with the patient.  > 50% of time was spent in direct patient care.  Signed, Addison Naegeli. Audie Box, MD, [place]  [address] [place], [place] [address] [phone]  07/08/2021 4:59 PM

## 2021-07-08 ENCOUNTER — Encounter: Payer: Self-pay | Admitting: Cardiovascular Disease

## 2021-07-08 ENCOUNTER — Ambulatory Visit: Payer: 59 | Admitting: Cardiovascular Disease

## 2021-07-08 ENCOUNTER — Other Ambulatory Visit: Payer: Self-pay

## 2021-07-08 VITALS — BP 140/95 | HR 67 | Ht 68.0 in | Wt 292.0 lb

## 2021-07-08 DIAGNOSIS — E785 Hyperlipidemia, unspecified: Secondary | ICD-10-CM | POA: Diagnosis not present

## 2021-07-08 DIAGNOSIS — I10 Essential (primary) hypertension: Secondary | ICD-10-CM

## 2021-07-08 DIAGNOSIS — R002 Palpitations: Secondary | ICD-10-CM | POA: Diagnosis not present

## 2021-07-08 NOTE — Patient Instructions (Signed)
Medication Instructions:  °The current medical regimen is effective;  continue present plan and medications. ° °*If you need a refill on your cardiac medications before your next appointment, please call your pharmacy* ° ° °Follow-Up: °At [facility], you and your health needs are our priority.  As part of our continuing mission to provide you with exceptional heart care, we have created designated Provider Care Teams.  These Care Teams include your primary Cardiologist (physician) and Advanced Practice Providers (APPs -  Physician Assistants and Nurse Practitioners) who all work together to provide you with the care you need, when you need it. ° °We recommend signing up for the patient portal called "MyChart".  Sign up information is provided on this After Visit Summary.  MyChart is used to connect with patients for Virtual Visits (Telemedicine).  Patients are able to view lab/test results, encounter notes, upcoming appointments, etc.  Non-urgent messages can be sent to your provider as well.   °To learn more about what you can do with MyChart, go to [email or web address].   ° °Your next appointment:   °12 month(s) ° °The format for your next appointment:   °In Person ° °Provider:   °Ballard T O'Neal, MD   ° ° ° °

## 2021-07-10 ENCOUNTER — Other Ambulatory Visit (HOSPITAL_BASED_OUTPATIENT_CLINIC_OR_DEPARTMENT_OTHER): Payer: Self-pay

## 2021-07-10 ENCOUNTER — Telehealth: Payer: 59 | Admitting: Family

## 2021-07-10 DIAGNOSIS — R399 Unspecified symptoms and signs involving the genitourinary system: Secondary | ICD-10-CM | POA: Diagnosis not present

## 2021-07-10 MED ORDER — CEPHALEXIN 500 MG PO CAPS
500.0000 mg | ORAL_CAPSULE | Freq: Two times a day (BID) | ORAL | 0 refills | Status: DC
  Filled 2021-07-10: qty 14, 7d supply, fill #0

## 2021-07-10 NOTE — Progress Notes (Signed)

## 2021-07-11 DIAGNOSIS — F319 Bipolar disorder, unspecified: Secondary | ICD-10-CM | POA: Diagnosis not present

## 2021-07-12 ENCOUNTER — Other Ambulatory Visit (HOSPITAL_BASED_OUTPATIENT_CLINIC_OR_DEPARTMENT_OTHER): Payer: Self-pay

## 2021-07-24 DIAGNOSIS — F319 Bipolar disorder, unspecified: Secondary | ICD-10-CM | POA: Diagnosis not present

## 2021-08-08 ENCOUNTER — Ambulatory Visit: Payer: 59 | Admitting: Family Medicine

## 2021-08-08 DIAGNOSIS — F319 Bipolar disorder, unspecified: Secondary | ICD-10-CM | POA: Diagnosis not present

## 2021-08-08 DIAGNOSIS — Z62819 Personal history of unspecified abuse in childhood: Secondary | ICD-10-CM | POA: Diagnosis not present

## 2021-08-14 ENCOUNTER — Other Ambulatory Visit (HOSPITAL_BASED_OUTPATIENT_CLINIC_OR_DEPARTMENT_OTHER): Payer: Self-pay

## 2021-08-19 ENCOUNTER — Encounter: Payer: Self-pay | Admitting: Cardiovascular Disease

## 2021-08-22 DIAGNOSIS — F319 Bipolar disorder, unspecified: Secondary | ICD-10-CM | POA: Diagnosis not present

## 2021-08-23 ENCOUNTER — Other Ambulatory Visit (HOSPITAL_BASED_OUTPATIENT_CLINIC_OR_DEPARTMENT_OTHER): Payer: Self-pay

## 2021-08-29 ENCOUNTER — Ambulatory Visit (INDEPENDENT_AMBULATORY_CARE_PROVIDER_SITE_OTHER): Payer: 59 | Admitting: Family Medicine

## 2021-08-29 ENCOUNTER — Encounter: Payer: Self-pay | Admitting: Family Medicine

## 2021-08-29 VITALS — BP 140/90 | HR 66 | Temp 97.6°F | Resp 16 | Wt 292.4 lb

## 2021-08-29 DIAGNOSIS — E782 Mixed hyperlipidemia: Secondary | ICD-10-CM | POA: Diagnosis not present

## 2021-08-29 DIAGNOSIS — G4762 Sleep related leg cramps: Secondary | ICD-10-CM

## 2021-08-29 DIAGNOSIS — I10 Essential (primary) hypertension: Secondary | ICD-10-CM | POA: Diagnosis not present

## 2021-08-29 NOTE — Progress Notes (Signed)
? ?  Subjective:  ? ? Patient ID: [name], female    DOB: [birth year], 51 y.o.   MRN: [id number] ? ?HPI ?HTN-  chronic problem, on Lisinopril 40mg  daily and Aldactone 25mg  daily.  The Aldactone was just recently increased by Cards (3/27).  BP today is better than 07/08/21 cardiology visit.  No CP, SOB, edema.  Was having increased HAs when BP was higher.  Mild blurry vision when sugars are high. ? ?Hyperlipidemia- chronic problem, on Lipitor 40mg  daily.  No abd pain, N/V ? ?Obesity- BMI 44.46  This is stable since our last appt in January.  Now walking 4x/week.  Pt has recently resumed low carb diet. ? ?Leg cramps- occurring only at night.  Have mildly improved w/ addition of B12 and magnesium.  Pt is drinking 80-100oz of fluid each day. ? ? ?Review of Systems ?For ROS see HPI  ?   ?Objective:  ? Physical Exam ?Vitals reviewed.  ?Constitutional:   ?   General: She is not in acute distress. ?   Appearance: Normal appearance. She is well-developed. She is obese. She is not ill-appearing.  ?HENT:  ?   Head: Normocephalic and atraumatic.  ?Eyes:  ?   Conjunctiva/sclera: Conjunctivae normal.  ?   Pupils: Pupils are equal, round, and reactive to light.  ?Neck:  ?   Thyroid: No thyromegaly.  ?Cardiovascular:  ?   Rate and Rhythm: Normal rate and regular rhythm.  ?   Pulses: Normal pulses.  ?   Heart sounds: Normal heart sounds. No murmur heard. ?Pulmonary:  ?   Effort: Pulmonary effort is normal. No respiratory distress.  ?   Breath sounds: Normal breath sounds.  ?Abdominal:  ?   General: There is no distension.  ?   Palpations: Abdomen is soft.  ?   Tenderness: There is no abdominal tenderness.  ?Musculoskeletal:  ?   Cervical back: Normal range of motion and neck supple.  ?   Right lower leg: No edema.  ?   Left lower leg: No edema.  ?Lymphadenopathy:  ?   Cervical: No cervical adenopathy.  ?Skin: ?   General: Skin is warm and dry.  ?Neurological:  ?   Mental Status: She is alert and oriented to person, place, and  time.  ?Psychiatric:     ?   Behavior: Behavior normal.  ? ? ? ? ? ?   ?Assessment & Plan:  ? ?Nocturnal leg cramps- sxs have mildly improved w/ addition of B12 and magnesium.  She does have hyponatremia which could be causing her sxs.  Given her high water intake, will try and decrease this to avoid over diluting her sodium.  Pt expressed understanding and is in agreement w/ plan.  ?

## 2021-08-29 NOTE — Patient Instructions (Signed)
Schedule your complete physical in 6 months ?We'll notify you of your lab results and make any changes if needed ?Continue to work on low carb diet and regular walking ?Decrease your fluid intake to 60-80 oz daily ?Call with any questions or concerns ?Happy [name]!!! ?

## 2021-08-30 LAB — BASIC METABOLIC PANEL
BUN: 15 mg/dL (ref 7–25)
CO2: 26 mmol/L (ref 20–32)
Calcium: 9.2 mg/dL (ref 8.6–10.2)
Chloride: 93 mmol/L — ABNORMAL LOW (ref 98–110)
Creat: 0.58 mg/dL (ref 0.50–0.99)
Glucose, Bld: 96 mg/dL (ref 65–99)
Potassium: 5.4 mmol/L — ABNORMAL HIGH (ref 3.5–5.3)
Sodium: 130 mmol/L — ABNORMAL LOW (ref 135–146)

## 2021-08-30 LAB — LIPID PANEL
Cholesterol: 158 mg/dL (ref <200)
HDL: 60 mg/dL (ref >=50)
LDL Cholesterol (Calc): 79 mg/dL (calc)
Non-HDL Cholesterol (Calc): 98 mg/dL (calc) (ref <130)
Total CHOL/HDL Ratio: 2.6 (calc) (ref <5.0)
Triglycerides: 109 mg/dL (ref <150)

## 2021-08-30 LAB — CBC WITH DIFFERENTIAL/PLATELET
Absolute Monocytes: 912 cells/uL (ref 200–950)
Basophils Absolute: 78 cells/uL (ref 0–200)
Basophils Relative: 0.8 %
Eosinophils Absolute: 718 cells/uL — ABNORMAL HIGH (ref 15–500)
Eosinophils Relative: 7.4 %
HCT: 36.9 % (ref 35.0–45.0)
Hemoglobin: 11.9 g/dL (ref 11.7–15.5)
Lymphs Abs: 2910 cells/uL (ref 850–3900)
MCH: 28.2 pg (ref 27.0–33.0)
MCHC: 32.2 g/dL (ref 32.0–36.0)
MCV: 87.4 fL (ref 80.0–100.0)
MPV: 12.1 fL (ref 7.5–12.5)
Monocytes Relative: 9.4 %
Neutro Abs: 5083 cells/uL (ref 1500–7800)
Neutrophils Relative %: 52.4 %
Platelets: 325 10*3/uL (ref 140–400)
RBC: 4.22 10*6/uL (ref 3.80–5.10)
RDW: 12.3 % (ref 11.0–15.0)
Total Lymphocyte: 30 %
WBC: 9.7 10*3/uL (ref 3.8–10.8)

## 2021-08-30 LAB — HEPATIC FUNCTION PANEL
AG Ratio: 1.4 (calc) (ref 1.0–2.5)
ALT: 15 U/L (ref 6–29)
AST: 17 U/L (ref 10–35)
Albumin: 3.9 g/dL (ref 3.6–5.1)
Alkaline phosphatase (APISO): 95 U/L (ref 31–125)
Bilirubin, Direct: 0 mg/dL (ref 0.0–0.2)
Globulin: 2.7 g/dL (calc) (ref 1.9–3.7)
Indirect Bilirubin: 0.2 mg/dL (calc) (ref 0.2–1.2)
Total Bilirubin: 0.2 mg/dL (ref 0.2–1.2)
Total Protein: 6.6 g/dL (ref 6.1–8.1)

## 2021-08-30 LAB — TSH: TSH: 1.55 mIU/L

## 2021-09-03 ENCOUNTER — Encounter: Payer: Self-pay | Admitting: Cardiovascular Disease

## 2021-09-03 ENCOUNTER — Other Ambulatory Visit (HOSPITAL_BASED_OUTPATIENT_CLINIC_OR_DEPARTMENT_OTHER): Payer: Self-pay

## 2021-09-03 MED ORDER — SPIRONOLACTONE 25 MG PO TABS
25.0000 mg | ORAL_TABLET | Freq: Every day | ORAL | 1 refills | Status: DC
  Filled 2021-09-03 – 2021-09-06 (×3): qty 90, 90d supply, fill #0
  Filled 2021-12-10: qty 90, 90d supply, fill #1

## 2021-09-05 ENCOUNTER — Other Ambulatory Visit (HOSPITAL_BASED_OUTPATIENT_CLINIC_OR_DEPARTMENT_OTHER): Payer: Self-pay

## 2021-09-05 DIAGNOSIS — F319 Bipolar disorder, unspecified: Secondary | ICD-10-CM | POA: Diagnosis not present

## 2021-09-05 DIAGNOSIS — Z62819 Personal history of unspecified abuse in childhood: Secondary | ICD-10-CM | POA: Diagnosis not present

## 2021-09-06 ENCOUNTER — Other Ambulatory Visit (HOSPITAL_BASED_OUTPATIENT_CLINIC_OR_DEPARTMENT_OTHER): Payer: Self-pay

## 2021-09-17 ENCOUNTER — Other Ambulatory Visit (HOSPITAL_BASED_OUTPATIENT_CLINIC_OR_DEPARTMENT_OTHER): Payer: Self-pay

## 2021-09-17 NOTE — Assessment & Plan Note (Signed)
Chronic problem.  Currently on Lisinopril 40mg  daily and Aldactone 25mg  daily.  The Aldactone was just recently increased by Cards.  BP is better today- although still not at goal- and w/ recent medication change will allow more time for BP to improve.  No med changes.  Thankfully she is asymptomatic at this time b/c she was having increased HAs when BP was elevated.  Will continue to follow. ?

## 2021-09-17 NOTE — Assessment & Plan Note (Signed)
Ongoing issue for pt.  BMI is 44.46.  This is stable.  Pt is now walking 4x/week and has resumed low carb diet as A1C was high.  Will continue to follow. ?

## 2021-09-17 NOTE — Assessment & Plan Note (Signed)
Chronic problem.  On Lipitor 40mg daily w/o difficulty.  Check labs.  Adjust meds prn  ?

## 2021-09-18 ENCOUNTER — Other Ambulatory Visit (HOSPITAL_BASED_OUTPATIENT_CLINIC_OR_DEPARTMENT_OTHER): Payer: Self-pay

## 2021-09-18 DIAGNOSIS — F3132 Bipolar disorder, current episode depressed, moderate: Secondary | ICD-10-CM | POA: Diagnosis not present

## 2021-09-18 DIAGNOSIS — F411 Generalized anxiety disorder: Secondary | ICD-10-CM | POA: Diagnosis not present

## 2021-09-18 MED ORDER — LAMOTRIGINE 200 MG PO TABS
200.0000 mg | ORAL_TABLET | Freq: Two times a day (BID) | ORAL | 0 refills | Status: DC
  Filled 2021-09-18: qty 180, 90d supply, fill #0

## 2021-09-19 ENCOUNTER — Other Ambulatory Visit (HOSPITAL_BASED_OUTPATIENT_CLINIC_OR_DEPARTMENT_OTHER): Payer: Self-pay

## 2021-09-19 ENCOUNTER — Ambulatory Visit: Payer: 59 | Admitting: Internal Medicine

## 2021-09-19 DIAGNOSIS — F319 Bipolar disorder, unspecified: Secondary | ICD-10-CM | POA: Diagnosis not present

## 2021-09-19 DIAGNOSIS — Z62819 Personal history of unspecified abuse in childhood: Secondary | ICD-10-CM | POA: Diagnosis not present

## 2021-09-19 MED ORDER — TEGRETOL-XR 200 MG PO TB12
ORAL_TABLET | ORAL | 0 refills | Status: DC
  Filled 2021-12-20: qty 270, 90d supply, fill #0

## 2021-09-19 MED ORDER — GABAPENTIN 600 MG PO TABS
600.0000 mg | ORAL_TABLET | Freq: Three times a day (TID) | ORAL | 0 refills | Status: DC
  Filled 2021-12-10: qty 270, 90d supply, fill #0

## 2021-09-19 MED ORDER — TIAGABINE HCL 4 MG PO TABS
8.0000 mg | ORAL_TABLET | Freq: Two times a day (BID) | ORAL | 0 refills | Status: DC
  Filled 2021-09-19: qty 360, 90d supply, fill #0

## 2021-09-19 MED ORDER — ARIPIPRAZOLE 15 MG PO TABS
15.0000 mg | ORAL_TABLET | Freq: Every morning | ORAL | 0 refills | Status: DC
  Filled 2021-09-19: qty 90, 90d supply, fill #0

## 2021-09-19 MED ORDER — LAMOTRIGINE 200 MG PO TABS: 200.0000 mg | ORAL_TABLET | Freq: Every day | ORAL | 0 refills | Status: DC

## 2021-09-20 ENCOUNTER — Other Ambulatory Visit (HOSPITAL_BASED_OUTPATIENT_CLINIC_OR_DEPARTMENT_OTHER): Payer: Self-pay

## 2021-09-26 DIAGNOSIS — E113293 Type 2 diabetes mellitus with mild nonproliferative diabetic retinopathy without macular edema, bilateral: Secondary | ICD-10-CM | POA: Diagnosis not present

## 2021-09-26 DIAGNOSIS — H43393 Other vitreous opacities, bilateral: Secondary | ICD-10-CM | POA: Diagnosis not present

## 2021-09-26 DIAGNOSIS — H43813 Vitreous degeneration, bilateral: Secondary | ICD-10-CM | POA: Diagnosis not present

## 2021-10-01 ENCOUNTER — Encounter: Payer: Self-pay | Admitting: Internal Medicine

## 2021-10-01 ENCOUNTER — Ambulatory Visit: Payer: 59 | Admitting: Internal Medicine

## 2021-10-01 VITALS — BP 136/92 | HR 71 | Ht 68.0 in | Wt 295.0 lb

## 2021-10-01 DIAGNOSIS — E782 Mixed hyperlipidemia: Secondary | ICD-10-CM | POA: Diagnosis not present

## 2021-10-01 DIAGNOSIS — E119 Type 2 diabetes mellitus without complications: Secondary | ICD-10-CM

## 2021-10-01 LAB — POCT GLYCOSYLATED HEMOGLOBIN (HGB A1C): Hemoglobin A1C: 8.4 % — AB (ref 4.0–5.6)

## 2021-10-01 NOTE — Progress Notes (Signed)
Patient ID: [name], female   DOB: 1970/11/25, 51 y.o.   MRN: [id number] ? ?This visit occurred during the SARS-CoV-2 public health emergency.  Safety protocols were in place, including screening questions prior to the visit, additional usage of staff PPE, and extensive cleaning of exam room while observing appropriate contact time as indicated for disinfecting solutions.  ? ?HPI: ?[name] is a 51 y.o.-year-old female, presenting for f/u for DM2, dx in 2001, non-insulin-dependent, uncontrolled, without long term complications. Last visit 4 months ago. ? ?Interim history: ?No increased urination, blurry vision, nausea, chest pain. ?She continues to have a hard time grieving for her father who died of cancer in 01/03/21. ? ?Reviewed HbA1c levels: ?Lab Results  ?Component Value Date  ? HGBA1C 9.7 (A) 06/13/2021  ? HGBA1C 6.4 (A) 12/11/2020  ? HGBA1C 6.6 (A) 08/16/2020  ? HGBA1C 8.1 (A) 12/15/2019  ? HGBA1C 7.6 (H) 07/12/2019  ? HGBA1C 6.7 (A) 11/30/2018  ? HGBA1C 8.5 (A) 07/26/2018  ? HGBA1C 9.9 (H) 04/15/2018  ? HGBA1C 7.6 08/03/2017  ? HGBA1C 6.6 (H) 01/03/17  ? HGBA1C 6.1 (H) 10/09/2016  ? HGBA1C 9.6 02/21/2016  ? HGBA1C 9.2 10/05/2015  ? HGBA1C 10.4 05/31/2015  ? HGBA1C 8.8 01/09/2015  ? HGBA1C 11.0 (H) 06/21/2014  ? HGBA1C 8.8 (H) 01/03/2014  ? HGBA1C 9.6 (H) 04/12/2013  ? HGBA1C 11.0 (H) 11/18/2012  ? HGBA1C 9.0 (H) 11/12/2011  ? ?Pt was on a regimen of: ?- Metformin 2000 mg at dinnertime ?- Lantus 63 units at bedtime ?- Victoza 1.8 mg daily ?- Amaryl 4 mg 2x a day ?She had frequent yeast inf when sugars were higher before.  ? ?Pt is now on a regimen of: ?- Metformin 500 mg 2x a day >> 1000 mg 2x a day -increased in 03/2018 >> 1000 mg with dinner >> 1000 mg 2x a day ?- Trulicity -initially had nausea, then resolved: 1.5 mg weekly - only restarted 2 weeks ago ? ?She is checking sugars 0-1x a day: ?- am:  110-130 >> 110-140 >> 93-143 >> 87-128 >> 120-130, occas. 150 - pizza ?- 2h after b'fast:  145-176 >> n/c >> 71-100 >> 83-99 >> n/c ?- before lunch: 110-120 >> n/c >> 90, 93 >> 76-102 >> 125-130s ?- 2h after lunch:  106-166 >> n/c >> 84, 192 >> 80-94 >> 130 ?- before dinner: 120-150 >> n/c >> 95, 99 >> 85-111 >> n/c ?- 2h after dinner: 83-101 >> 100-120 >> n/c  ?- bedtime:  120-150 >> 170-200 >> 118-160 >> 91-133 >> 150s, occas. 200 ?- nighttime: n/c ?Lowest sugar was 34 (after Sx - while still on insulin) >> ... 76 >> 120; she has hypoglycemia awareness in the 90s. ?Highest sugar was 330 >> ...  160 >> 178 >> 200s >> 225 x1. ? ?Glucometer: One Touch Ultra 2 >> FreeStyle Lite ?  ?-No CKD, last BUN/creatinine:  ?Lab Results  ?Component Value Date  ? BUN 15 08/29/2021  ? CREATININE 0.58 08/29/2021  ?On lisinopril 20. ? ?-+ HL; last set of lipids: ?Lab Results  ?Component Value Date  ? CHOL 158 08/29/2021  ? HDL 60 08/29/2021  ? Arroyo 79 08/29/2021  ? LDLDIRECT 112.0 06/21/2014  ? TRIG 109 08/29/2021  ? CHOLHDL 2.6 08/29/2021  ?On Lipitor 40. ? 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  She had cataracts but is status post cataract surgery.  Dr. Delman Cheadle.  She sees a retina specialist. ? ?-No  numbness and tingling in her feet.  ? ?She had Roux-en-Y gastric bypass surgery in 03/2016.  After this, she lost more than 100 pounds but she gained more than 50 pounds back afterwards. ?She had a syncopal episode in [place] in 09/2020, after staying in the sun for 3 hours.  At that time, she went to the emergency room.  She was given IV fluids.  EKG was normal.  HCTZ was stopped and lisinopril was increased.  Next day, she tested positive for COVID.  This was complicated by bronchitis > had ABx. ?She had a previous syncopal episode in [place] in 03/2020. ? ?ROS: ?+ see HPI ? ?I reviewed pt's medications, allergies, PMH, social hx, family hx, and changes were documented in the history of present illness. Otherwise, unchanged from my initial visit note. ? ?Past Medical History:   ?Diagnosis Date  ? Allergy   ? seasonal allergies  ? Anemia   ? hx of  ? Anxiety   ? on meds  ? Asthma   ? hx of  ? Bipolar disorder ([place])   ? Cataract   ? bilateral sx  ? Chicken pox   ? Complication of anesthesia   ? oxygen saturation dropped after hysterectomy 2009 at [facility]  ? Depression   ? on meds  ? Diabetes mellitus   ? on meds  ? Dyspnea   ? GERD (gastroesophageal reflux disease)   ? hx of  ? Hyperlipidemia   ? on meds  ? Hypertension   ? readings  ? Hypothyroidism   ? hx of-LEFT thyroid removed  ? Irregular heartbeat   ? saw dr Ron Parker sees -cardiology as needed  ? Migraine   ? Sleep apnea   ? uses cpap  ? ?Past Surgical History:  ?Procedure Laterality Date  ? ANKLE SURGERY Left 1989  ? BACK SURGERY  1999  ? CATARACT EXTRACTION, BILATERAL    ? CESAREAN SECTION    ? 1995  ? LAPAROSCOPIC ASSISTED VAGINAL HYSTERECTOMY  2009  ?   BSO fibroids, DUB, pelvic pain  ? 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 REPAIR N/A 04/14/2016  ? Procedure: LAPAROSCOPIC ROUX-EN-Y GASTRIC BYPASS  WITH UPPER ENDOSCOPY;  Surgeon: Excell Seltzer, MD;  Location: [facility];  Service: General;  Laterality: N/A;  ? SHOULDER ARTHROSCOPY WITH OPEN ROTATOR CUFF REPAIR Right 01/02/2017  ? Procedure: Right shoulder arthroscopy, A-subcromial decompression, mini open rotator cuff repair, open distal clavicle resection, biceps tenodesis;  Surgeon: Netta Cedars, MD;  Location: [facility];  Service: Orthopedics;  Laterality: Right;  ? THYROIDECTOMY, PARTIAL Left 2001  ? UMBILICAL HERNIA REPAIR  2003  ? WISDOM TOOTH EXTRACTION    ? ?Social History  ? ?Social History  ? Marital Status: Married  ?  Spouse Name: N/A  ? Number of Children: 1  ? ?Occupational History  ? Registrar, [employer]  ? ?Social History Main Topics  ? Smoking status: Never Smoker   ? Smokeless tobacco: Never Used  ? Alcohol Use: No  ? Drug Use: No  ? ?Current Outpatient Medications on File Prior to Visit  ?Medication Sig Dispense Refill  ? ARIPiprazole (ABILIFY) 10  MG tablet Take 10 mg by mouth daily.   1  ? ARIPiprazole (ABILIFY) 15 MG tablet TAKE 1 TABLET BY MOUTH EVERY MORNING 90 tablet 0  ? ASPIRIN PO Take 162 mg by mouth daily.    ? atorvastatin (LIPITOR) 40 MG tablet Take 1 tablet (40 mg total) by mouth daily. 90 tablet 1  ?  Biotin 10000 MCG TABS Take 1 tablet by mouth daily.    ? Blood Glucose Monitoring Suppl (FREESTYLE LITE) w/Device KIT Use to check blood sugar 2 times daily 1 kit 0  ? Calcium Carbonate-Vit D-Min (CALTRATE 600+D PLUS MINIS PO) Take 1 tablet by mouth 3 (three) times daily.    ? carbamazepine (TEGRETOL XR) 200 MG 12 hr tablet 1 tablet in the morning, 2 at night    ? Dulaglutide (TRULICITY) 1.5 JW/9.0RM SOPN INJECT 1.5MG INTO THE SKIN ONCE A WEEK 6 mL 3  ? gabapentin (NEURONTIN) 600 MG tablet Take 1 tablet by mouth three times a day 270 tablet 1  ? gabapentin (NEURONTIN) 600 MG tablet Take 1 tablet by mouth three times a day 270 tablet 0  ? Glucosamine HCl (GLUCOSAMINE PO) Take 3 tablets by mouth daily.    ? glucose blood (FREESTYLE LITE) test strip Use to check blood sugar 2 times daily 100 each 0  ? lamoTRIgine (LAMICTAL) 200 MG tablet Take 200 mg by mouth 2 (two) times daily.    ? lamoTRIgine (LAMICTAL) 200 MG tablet TAKE 1 TABLET BY MOUTH DAILY 180 tablet 0  ? Lancets (FREESTYLE) lancets Use as instructed to check sugar 2 times daily 200 each 5  ? Lancets (FREESTYLE) lancets Use to check blood sugar 2 times daily 100 each 0  ? lisinopril (ZESTRIL) 40 MG tablet Take 1 tablet (40 mg total) by mouth daily. 90 tablet 1  ? Loratadine (CLARITIN PO) Take by mouth daily.    ? metFORMIN (GLUCOPHAGE) 1000 MG tablet Take 1 tablet (1,000 mg total) by mouth 2 (two) times daily with a meal. 180 tablet 3  ? Multiple Vitamin (MULTIVITAMIN) tablet Take 1 tablet by mouth daily.    ? spironolactone (ALDACTONE) 25 MG tablet Take 1 tablet (25 mg total) by mouth daily. 90 tablet 1  ? TEGRETOL-XR 200 MG 12 hr tablet Take 1 tablet by mouth every morning and take 2 tablets  at bedtime 270 tablet 0  ? tiagabine (GABITRIL) 16 MG tablet Take one-half tablet (8 mg total) by mouth 2 (two) times daily. 90 tablet 0  ? tiaGABine (GABITRIL) 4 MG tablet Take 8 mg by mouth 2 (two) times da

## 2021-10-01 NOTE — Patient Instructions (Addendum)
Please continue: ?- Metformin 1000 mg 2x a day ?- Trulicity 1.5 mg weekly ? ?Please come back for a follow-up appointment in 4 months. ?

## 2021-10-04 ENCOUNTER — Other Ambulatory Visit (HOSPITAL_BASED_OUTPATIENT_CLINIC_OR_DEPARTMENT_OTHER): Payer: Self-pay

## 2021-10-04 ENCOUNTER — Other Ambulatory Visit: Payer: Self-pay | Admitting: Family Medicine

## 2021-10-04 MED ORDER — ATORVASTATIN CALCIUM 40 MG PO TABS
40.0000 mg | ORAL_TABLET | Freq: Every day | ORAL | 1 refills | Status: DC
  Filled 2021-10-04: qty 90, 90d supply, fill #0
  Filled 2022-01-06: qty 90, 90d supply, fill #1

## 2021-10-10 ENCOUNTER — Other Ambulatory Visit: Payer: Self-pay | Admitting: Cardiovascular Disease

## 2021-10-11 ENCOUNTER — Other Ambulatory Visit (HOSPITAL_BASED_OUTPATIENT_CLINIC_OR_DEPARTMENT_OTHER): Payer: Self-pay

## 2021-10-11 MED ORDER — LISINOPRIL 40 MG PO TABS
40.0000 mg | ORAL_TABLET | Freq: Every day | ORAL | 1 refills | Status: DC
  Filled 2021-10-11: qty 90, 90d supply, fill #0
  Filled 2022-01-06: qty 90, 90d supply, fill #1

## 2021-10-14 ENCOUNTER — Other Ambulatory Visit (HOSPITAL_BASED_OUTPATIENT_CLINIC_OR_DEPARTMENT_OTHER): Payer: Self-pay

## 2021-10-14 MED ORDER — LAMOTRIGINE 200 MG PO TABS
200.0000 mg | ORAL_TABLET | Freq: Every day | ORAL | 0 refills | Status: DC
  Filled 2022-01-15: qty 90, 90d supply, fill #0

## 2021-10-17 DIAGNOSIS — F319 Bipolar disorder, unspecified: Secondary | ICD-10-CM | POA: Diagnosis not present

## 2021-10-31 DIAGNOSIS — Z630 Problems in relationship with spouse or partner: Secondary | ICD-10-CM | POA: Diagnosis not present

## 2021-10-31 DIAGNOSIS — F319 Bipolar disorder, unspecified: Secondary | ICD-10-CM | POA: Diagnosis not present

## 2021-10-31 DIAGNOSIS — Z62819 Personal history of unspecified abuse in childhood: Secondary | ICD-10-CM | POA: Diagnosis not present

## 2021-11-07 ENCOUNTER — Encounter (HOSPITAL_COMMUNITY): Payer: Self-pay | Admitting: *Deleted

## 2021-11-13 DIAGNOSIS — F411 Generalized anxiety disorder: Secondary | ICD-10-CM | POA: Diagnosis not present

## 2021-11-13 DIAGNOSIS — F3132 Bipolar disorder, current episode depressed, moderate: Secondary | ICD-10-CM | POA: Diagnosis not present

## 2021-11-14 ENCOUNTER — Other Ambulatory Visit (HOSPITAL_BASED_OUTPATIENT_CLINIC_OR_DEPARTMENT_OTHER): Payer: Self-pay

## 2021-11-16 IMAGING — MG DIGITAL SCREENING BILAT W/ TOMO W/ CAD
6 of 12 series · 6 of 36 positions shown · non-contrast
Comparison: Previous exam(s).

CLINICAL DATA: Screening.

EXAM:
DIGITAL SCREENING BILATERAL MAMMOGRAM WITH TOMO AND CAD

[L CC synth-2D (1 of 2)]
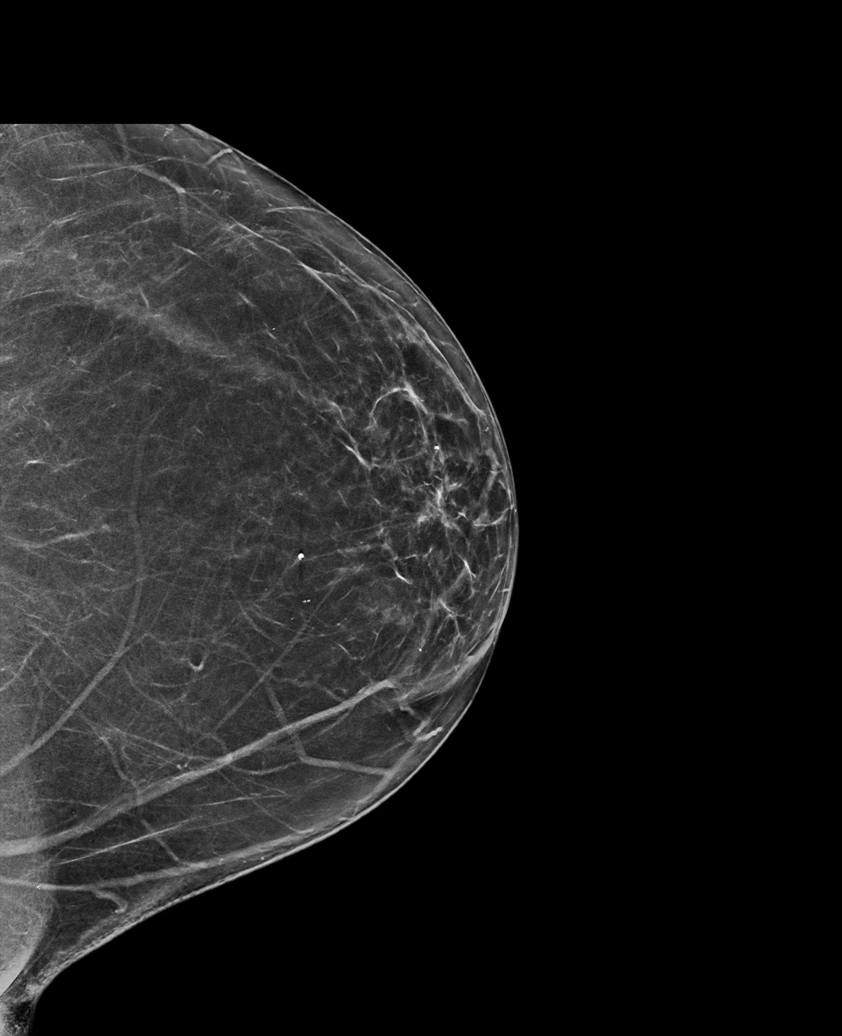

[R MLO synth-2D]
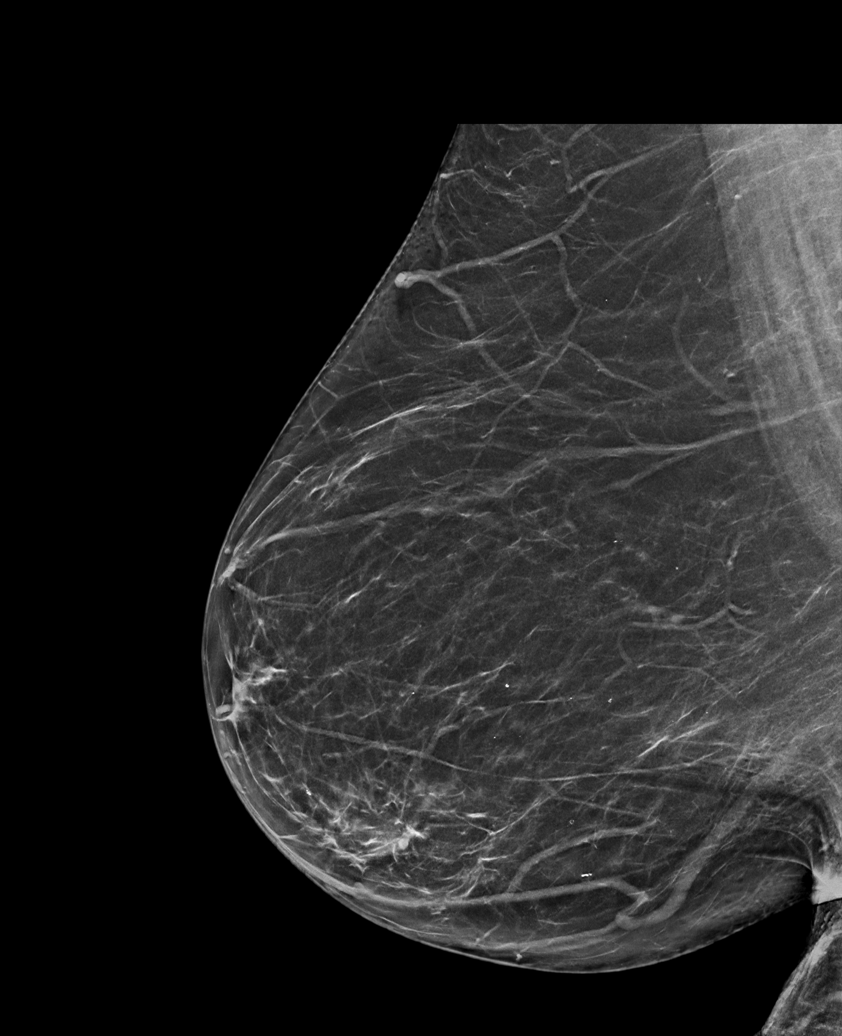

[L MLO synth-2D]
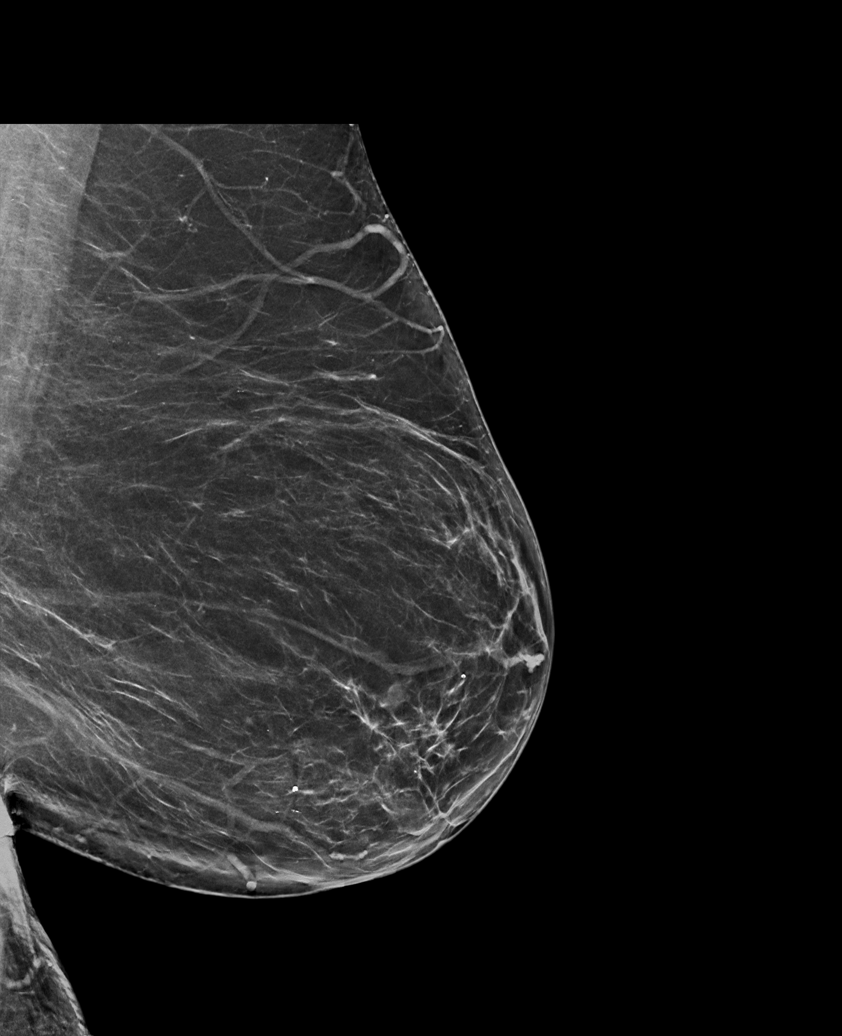

[L CC synth-2D (2 of 2)]
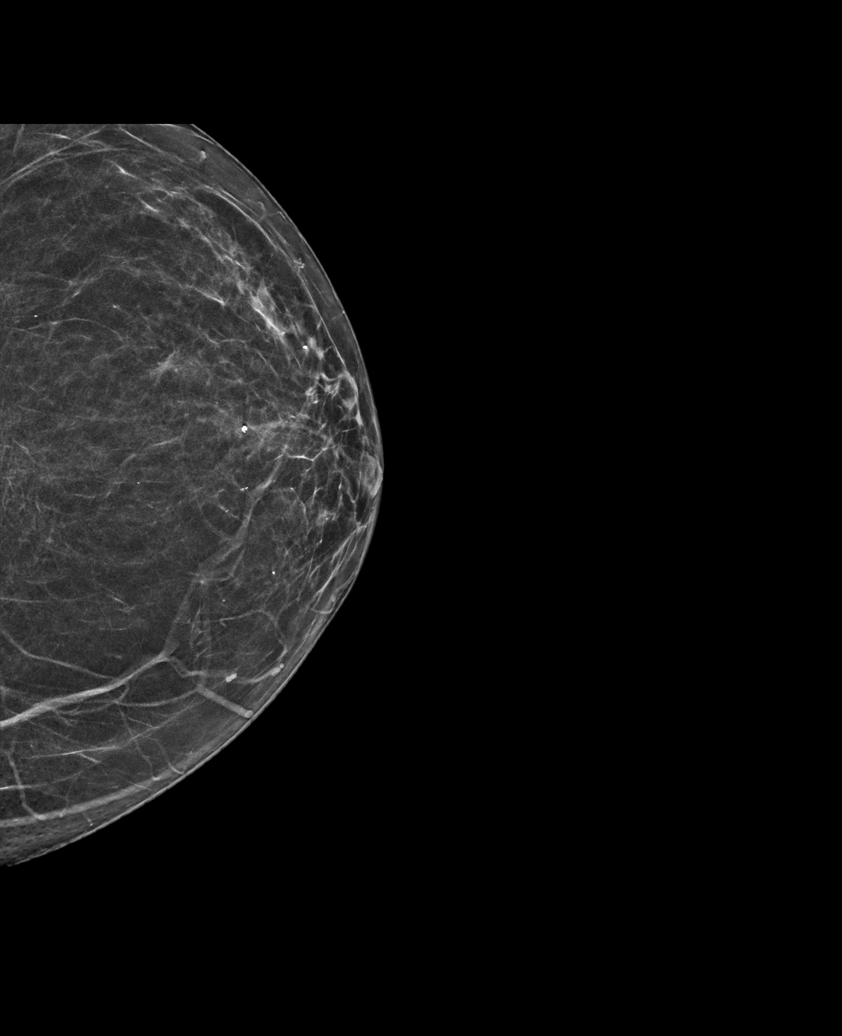

[R CC synth-2D (1 of 2)]
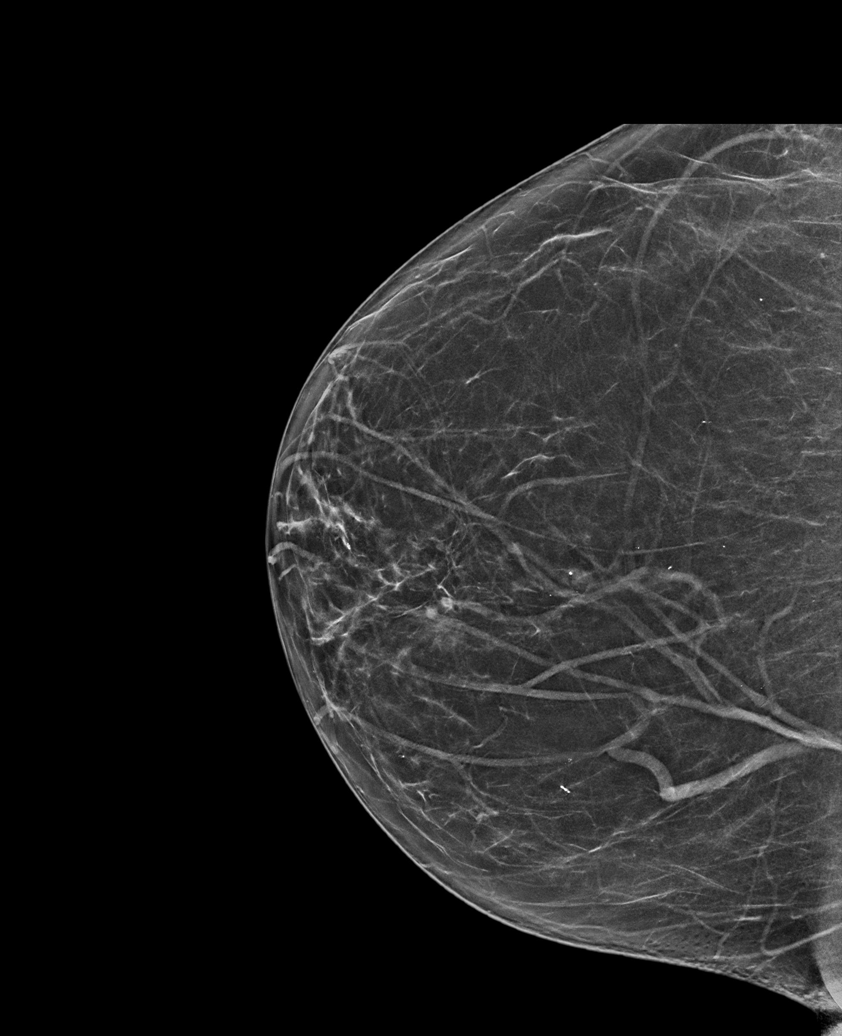

[R CC synth-2D (2 of 2)]
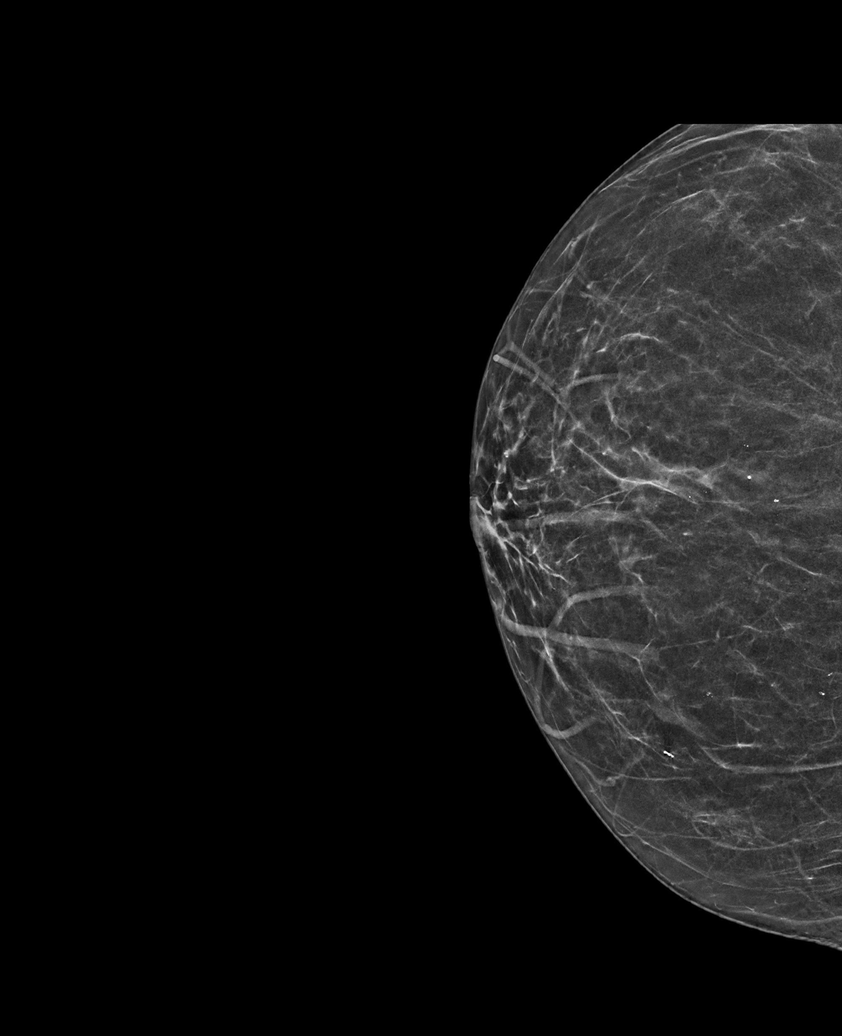

[6 of 36 positions shown; findings below may reference images not displayed]

ACR Breast Density Category b: There are scattered areas of
fibroglandular density.
FINDINGS: There are no findings suspicious for malignancy. Images were
processed with CAD.
IMPRESSION: No mammographic evidence of malignancy. A result letter of this
screening mammogram will be mailed directly to the patient.

RECOMMENDATION:
Screening mammogram in one year. (Code:[id number]-775)

BI-RADS CATEGORY  1: Negative.

## 2021-11-18 ENCOUNTER — Other Ambulatory Visit (HOSPITAL_BASED_OUTPATIENT_CLINIC_OR_DEPARTMENT_OTHER): Payer: Self-pay

## 2021-11-19 ENCOUNTER — Other Ambulatory Visit (HOSPITAL_BASED_OUTPATIENT_CLINIC_OR_DEPARTMENT_OTHER): Payer: Self-pay

## 2021-11-19 MED ORDER — ARIPIPRAZOLE 10 MG PO TABS
10.0000 mg | ORAL_TABLET | Freq: Every morning | ORAL | 0 refills | Status: DC
  Filled 2021-11-19: qty 90, 90d supply, fill #0

## 2021-11-21 ENCOUNTER — Other Ambulatory Visit (HOSPITAL_BASED_OUTPATIENT_CLINIC_OR_DEPARTMENT_OTHER): Payer: Self-pay

## 2021-11-21 ENCOUNTER — Encounter: Payer: Self-pay | Admitting: Family Medicine

## 2021-11-21 ENCOUNTER — Ambulatory Visit: Payer: 59 | Admitting: Family Medicine

## 2021-11-21 VITALS — BP 126/84 | HR 77 | Temp 97.6°F | Resp 16 | Ht 68.0 in | Wt 295.4 lb

## 2021-11-21 DIAGNOSIS — G4762 Sleep related leg cramps: Secondary | ICD-10-CM

## 2021-11-21 LAB — BASIC METABOLIC PANEL
BUN: 16 mg/dL (ref 6–23)
CO2: 28 mEq/L (ref 19–32)
Calcium: 9 mg/dL (ref 8.4–10.5)
Chloride: 101 mEq/L (ref 96–112)
Creatinine, Ser: 0.6 mg/dL (ref 0.40–1.20)
GFR: 104.96 mL/min (ref >60.00)
Glucose, Bld: 117 mg/dL — ABNORMAL HIGH (ref 70–99)
Potassium: 4.4 mEq/L (ref 3.5–5.1)
Sodium: 136 mEq/L (ref 135–145)

## 2021-11-21 LAB — MAGNESIUM: Magnesium: 1.7 mg/dL (ref 1.5–2.5)

## 2021-11-21 MED ORDER — CYCLOBENZAPRINE HCL 10 MG PO TABS
10.0000 mg | ORAL_TABLET | Freq: Every day | ORAL | 3 refills | Status: DC
  Filled 2021-11-21: qty 30, 30d supply, fill #0
  Filled 2021-12-20: qty 30, 30d supply, fill #1
  Filled 2022-01-15: qty 30, 30d supply, fill #2
  Filled 2022-02-14: qty 30, 30d supply, fill #3

## 2021-11-21 NOTE — Patient Instructions (Signed)
Follow up as needed or as scheduled We'll notify you of your lab results and make any changes if needed START the Flexeril nightly If the flexeril isn't helping at night, try adding the mustard Call with any questions or concerns Hang in there!!!

## 2021-11-21 NOTE — Progress Notes (Signed)
   Subjective:    Patient ID: [name], female    DOB: 30-May-1970, 51 y.o.   MRN: [id number]  HPI Leg cramps- last discussed in April.  At that time pt had added B12 and Mg w/ some improvement.  Decreased her water intake due to hyponatremia but this didn't impact her nightly leg cramps.  Cramps are occurring nightly, and sometimes multiple times/night.  Has not tried tonic water or mustard before bed.   Review of Systems For ROS see HPI     Objective:   Physical Exam Vitals reviewed.  Constitutional:      General: She is not in acute distress.    Appearance: Normal appearance. She is obese. She is not ill-appearing.  HENT:     Head: Normocephalic and atraumatic.  Musculoskeletal:     Right lower leg: No edema.     Left lower leg: No edema.  Skin:    General: Skin is warm and dry.  Neurological:     General: No focal deficit present.     Mental Status: She is alert and oriented to person, place, and time.  Psychiatric:        Mood and Affect: Mood normal.        Behavior: Behavior normal.        Thought Content: Thought content normal.           Assessment & Plan:  Nocturnal leg cramps- ongoing issue for pt.  Minimal improvement w/ addition of magnesium and B12.  Will start Flexeril at night to prevent cramping.  Also discussed addition of mustard nightly if Flexeril doesn't provide relief.  Pt cannot bring herself to drink tonic water.  Check labs.  Will follow.

## 2021-11-28 DIAGNOSIS — Z630 Problems in relationship with spouse or partner: Secondary | ICD-10-CM | POA: Diagnosis not present

## 2021-11-28 DIAGNOSIS — F319 Bipolar disorder, unspecified: Secondary | ICD-10-CM | POA: Diagnosis not present

## 2021-11-28 DIAGNOSIS — Z62819 Personal history of unspecified abuse in childhood: Secondary | ICD-10-CM | POA: Diagnosis not present

## 2021-12-04 DIAGNOSIS — H52203 Unspecified astigmatism, bilateral: Secondary | ICD-10-CM | POA: Diagnosis not present

## 2021-12-04 LAB — HM DIABETES EYE EXAM

## 2021-12-05 ENCOUNTER — Encounter: Payer: Self-pay | Admitting: Family Medicine

## 2021-12-11 ENCOUNTER — Other Ambulatory Visit (HOSPITAL_BASED_OUTPATIENT_CLINIC_OR_DEPARTMENT_OTHER): Payer: Self-pay

## 2021-12-16 ENCOUNTER — Other Ambulatory Visit (HOSPITAL_BASED_OUTPATIENT_CLINIC_OR_DEPARTMENT_OTHER): Payer: Self-pay

## 2021-12-19 DIAGNOSIS — Z6379 Other stressful life events affecting family and household: Secondary | ICD-10-CM | POA: Diagnosis not present

## 2021-12-19 DIAGNOSIS — F319 Bipolar disorder, unspecified: Secondary | ICD-10-CM | POA: Diagnosis not present

## 2021-12-19 DIAGNOSIS — Z630 Problems in relationship with spouse or partner: Secondary | ICD-10-CM | POA: Diagnosis not present

## 2021-12-20 ENCOUNTER — Other Ambulatory Visit (HOSPITAL_BASED_OUTPATIENT_CLINIC_OR_DEPARTMENT_OTHER): Payer: Self-pay

## 2021-12-25 ENCOUNTER — Other Ambulatory Visit (HOSPITAL_BASED_OUTPATIENT_CLINIC_OR_DEPARTMENT_OTHER): Payer: Self-pay

## 2021-12-27 DIAGNOSIS — H26493 Other secondary cataract, bilateral: Secondary | ICD-10-CM | POA: Diagnosis not present

## 2021-12-31 ENCOUNTER — Other Ambulatory Visit (HOSPITAL_BASED_OUTPATIENT_CLINIC_OR_DEPARTMENT_OTHER): Payer: Self-pay

## 2021-12-31 MED ORDER — TIAGABINE HCL 4 MG PO TABS
ORAL_TABLET | ORAL | 0 refills | Status: DC
  Filled 2021-12-31: qty 360, 90d supply, fill #0

## 2022-01-01 ENCOUNTER — Other Ambulatory Visit (HOSPITAL_BASED_OUTPATIENT_CLINIC_OR_DEPARTMENT_OTHER): Payer: Self-pay

## 2022-01-01 DIAGNOSIS — F3181 Bipolar II disorder: Secondary | ICD-10-CM | POA: Diagnosis not present

## 2022-01-03 DIAGNOSIS — H26491 Other secondary cataract, right eye: Secondary | ICD-10-CM | POA: Diagnosis not present

## 2022-01-06 ENCOUNTER — Other Ambulatory Visit (HOSPITAL_BASED_OUTPATIENT_CLINIC_OR_DEPARTMENT_OTHER): Payer: Self-pay

## 2022-01-08 DIAGNOSIS — F3132 Bipolar disorder, current episode depressed, moderate: Secondary | ICD-10-CM | POA: Diagnosis not present

## 2022-01-08 DIAGNOSIS — F411 Generalized anxiety disorder: Secondary | ICD-10-CM | POA: Diagnosis not present

## 2022-01-09 DIAGNOSIS — Z630 Problems in relationship with spouse or partner: Secondary | ICD-10-CM | POA: Diagnosis not present

## 2022-01-09 DIAGNOSIS — F319 Bipolar disorder, unspecified: Secondary | ICD-10-CM | POA: Diagnosis not present

## 2022-01-15 ENCOUNTER — Other Ambulatory Visit (HOSPITAL_BASED_OUTPATIENT_CLINIC_OR_DEPARTMENT_OTHER): Payer: Self-pay

## 2022-01-15 DIAGNOSIS — F3181 Bipolar II disorder: Secondary | ICD-10-CM | POA: Diagnosis not present

## 2022-01-22 DIAGNOSIS — F3181 Bipolar II disorder: Secondary | ICD-10-CM | POA: Diagnosis not present

## 2022-01-29 DIAGNOSIS — F3181 Bipolar II disorder: Secondary | ICD-10-CM | POA: Diagnosis not present

## 2022-01-30 ENCOUNTER — Ambulatory Visit: Payer: 59 | Admitting: Internal Medicine

## 2022-02-05 DIAGNOSIS — F3181 Bipolar II disorder: Secondary | ICD-10-CM | POA: Diagnosis not present

## 2022-02-07 ENCOUNTER — Other Ambulatory Visit (HOSPITAL_BASED_OUTPATIENT_CLINIC_OR_DEPARTMENT_OTHER): Payer: Self-pay

## 2022-02-11 LAB — HM DIABETES EYE EXAM

## 2022-02-14 ENCOUNTER — Other Ambulatory Visit (HOSPITAL_BASED_OUTPATIENT_CLINIC_OR_DEPARTMENT_OTHER): Payer: Self-pay

## 2022-02-19 DIAGNOSIS — F3181 Bipolar II disorder: Secondary | ICD-10-CM | POA: Diagnosis not present

## 2022-02-20 DIAGNOSIS — F319 Bipolar disorder, unspecified: Secondary | ICD-10-CM | POA: Diagnosis not present

## 2022-02-27 ENCOUNTER — Ambulatory Visit: Payer: 59 | Admitting: Internal Medicine

## 2022-02-27 ENCOUNTER — Encounter: Payer: Self-pay | Admitting: Internal Medicine

## 2022-02-27 ENCOUNTER — Encounter: Payer: Self-pay | Admitting: Family Medicine

## 2022-02-27 ENCOUNTER — Other Ambulatory Visit (HOSPITAL_BASED_OUTPATIENT_CLINIC_OR_DEPARTMENT_OTHER): Payer: Self-pay

## 2022-02-27 ENCOUNTER — Ambulatory Visit (INDEPENDENT_AMBULATORY_CARE_PROVIDER_SITE_OTHER): Payer: 59 | Admitting: Family Medicine

## 2022-02-27 VITALS — BP 136/88 | HR 86 | Ht 68.0 in | Wt 291.2 lb

## 2022-02-27 VITALS — BP 136/88 | HR 79 | Temp 98.2°F | Resp 16 | Ht 68.0 in | Wt 291.5 lb

## 2022-02-27 DIAGNOSIS — Z0000 Encounter for general adult medical examination without abnormal findings: Secondary | ICD-10-CM

## 2022-02-27 DIAGNOSIS — M79662 Pain in left lower leg: Secondary | ICD-10-CM | POA: Diagnosis not present

## 2022-02-27 DIAGNOSIS — Z8739 Personal history of other diseases of the musculoskeletal system and connective tissue: Secondary | ICD-10-CM

## 2022-02-27 DIAGNOSIS — E119 Type 2 diabetes mellitus without complications: Secondary | ICD-10-CM | POA: Diagnosis not present

## 2022-02-27 DIAGNOSIS — M79661 Pain in right lower leg: Secondary | ICD-10-CM | POA: Diagnosis not present

## 2022-02-27 DIAGNOSIS — M51369 Other intervertebral disc degeneration, lumbar region without mention of lumbar back pain or lower extremity pain: Secondary | ICD-10-CM | POA: Insufficient documentation

## 2022-02-27 DIAGNOSIS — E782 Mixed hyperlipidemia: Secondary | ICD-10-CM

## 2022-02-27 DIAGNOSIS — M5136 Other intervertebral disc degeneration, lumbar region: Secondary | ICD-10-CM | POA: Diagnosis not present

## 2022-02-27 LAB — CBC WITH DIFFERENTIAL/PLATELET
Basophils Absolute: 0.1 10*3/uL (ref 0.0–0.1)
Basophils Relative: 1.3 % (ref 0.0–3.0)
Eosinophils Absolute: 0.3 10*3/uL (ref 0.0–0.7)
Eosinophils Relative: 5.6 % — ABNORMAL HIGH (ref 0.0–5.0)
HCT: 36.3 % (ref 36.0–46.0)
Hemoglobin: 12.1 g/dL (ref 12.0–15.0)
Lymphocytes Relative: 16.3 % (ref 12.0–46.0)
Lymphs Abs: 0.9 10*3/uL (ref 0.7–4.0)
MCHC: 33.3 g/dL (ref 30.0–36.0)
MCV: 84.6 fl (ref 78.0–100.0)
Monocytes Absolute: 0.5 10*3/uL (ref 0.1–1.0)
Monocytes Relative: 9.1 % (ref 3.0–12.0)
Neutro Abs: 3.9 10*3/uL (ref 1.4–7.7)
Neutrophils Relative %: 67.7 % (ref 43.0–77.0)
Platelets: 275 10*3/uL (ref 150.0–400.0)
RBC: 4.29 Mil/uL (ref 3.87–5.11)
RDW: 14.4 % (ref 11.5–15.5)
WBC: 5.7 10*3/uL (ref 4.0–10.5)

## 2022-02-27 LAB — LIPID PANEL
Cholesterol: 172 mg/dL (ref 0–200)
HDL: 58.2 mg/dL (ref >39.00)
LDL Cholesterol: 84 mg/dL (ref 0–99)
NonHDL: 114.08
Total CHOL/HDL Ratio: 3
Triglycerides: 148 mg/dL (ref 0.0–149.0)
VLDL: 29.6 mg/dL (ref 0.0–40.0)

## 2022-02-27 LAB — HEPATIC FUNCTION PANEL
ALT: 20 U/L (ref 0–35)
AST: 22 U/L (ref 0–37)
Albumin: 4.1 g/dL (ref 3.5–5.2)
Alkaline Phosphatase: 106 U/L (ref 39–117)
Bilirubin, Direct: 0.1 mg/dL (ref 0.0–0.3)
Total Bilirubin: 0.3 mg/dL (ref 0.2–1.2)
Total Protein: 7.1 g/dL (ref 6.0–8.3)

## 2022-02-27 LAB — BASIC METABOLIC PANEL
BUN: 11 mg/dL (ref 6–23)
CO2: 27 mEq/L (ref 19–32)
Calcium: 9.3 mg/dL (ref 8.4–10.5)
Chloride: 93 mEq/L — ABNORMAL LOW (ref 96–112)
Creatinine, Ser: 0.56 mg/dL (ref 0.40–1.20)
GFR: 106.52 mL/min (ref >60.00)
Glucose, Bld: 157 mg/dL — ABNORMAL HIGH (ref 70–99)
Potassium: 4.8 mEq/L (ref 3.5–5.1)
Sodium: 128 mEq/L — ABNORMAL LOW (ref 135–145)

## 2022-02-27 LAB — MICROALBUMIN / CREATININE URINE RATIO
Creatinine,U: 77.7 mg/dL
Microalb Creat Ratio: 3.5 mg/g (ref 0.0–30.0)
Microalb, Ur: 2.7 mg/dL — ABNORMAL HIGH (ref 0.0–1.9)

## 2022-02-27 LAB — POCT GLYCOSYLATED HEMOGLOBIN (HGB A1C): Hemoglobin A1C: 8.1 % — AB (ref 4.0–5.6)

## 2022-02-27 LAB — TSH: TSH: 1.35 u[IU]/mL (ref 0.35–5.50)

## 2022-02-27 MED ORDER — TRULICITY 3 MG/0.5ML ~~LOC~~ SOAJ
3.0000 mg | SUBCUTANEOUS | 3 refills | Status: DC
  Filled 2022-02-27: qty 6, 84d supply, fill #0
  Filled 2022-09-08: qty 2, 28d supply, fill #0
  Filled 2022-10-03: qty 2, 28d supply, fill #1
  Filled 2022-10-31: qty 2, 28d supply, fill #2
  Filled 2022-11-28: qty 2, 28d supply, fill #3

## 2022-02-27 MED ORDER — METFORMIN HCL 1000 MG PO TABS
1000.0000 mg | ORAL_TABLET | Freq: Two times a day (BID) | ORAL | 3 refills | Status: DC
  Filled 2022-02-27 – 2022-03-17 (×4): qty 180, 90d supply, fill #0
  Filled 2022-06-11: qty 180, 90d supply, fill #1
  Filled 2022-09-08: qty 180, 90d supply, fill #2
  Filled 2022-12-05: qty 180, 90d supply, fill #3

## 2022-02-27 NOTE — Assessment & Plan Note (Signed)
New to provider, ongoing for pt.  She notes that recently when she lies down, she develops a burning pain in both lower legs.  Only occurs when lying down.  Suspect her back is causing neuropathic pain and she likely needs a nerve conduction study.  Will refer to neuro for their expertise and complete evaluation and tx.  Pt expressed understanding and is in agreement w/ plan.

## 2022-02-27 NOTE — Assessment & Plan Note (Signed)
Pt is due for microalbumin.  Has appt w/ Endo this afternoon.  UTD on foot exam, eye exam.  microalbumin ordered.

## 2022-02-27 NOTE — Progress Notes (Signed)
Subjective:    Patient ID: [name], female    DOB: Dec 05, 1970, 51 y.o.   MRN: [id number]  HPI CPE- UTD on mammo, foot exam, eye exam, Tdap, colonoscopy, flu.  Due for microalbumin.  Patient Care Team    Relationship Specialty Notifications Start End  Midge Minium, MD PCP - General Family Medicine  06/24/11   O'Neal, Cassie Freer, MD PCP - Cardiology Cardiology  12/05/20   Megan Salon, MD Consulting Physician Gynecology  07/26/15   Philemon Kingdom, MD Consulting Physician Internal Medicine  07/26/15   Pauline Good, NP  Nurse Practitioner  07/19/20     Health Maintenance  Topic Date Due   Diabetic kidney evaluation - Urine ACR  Never done   Zoster Vaccines- Shingrix (1 of 2) 05/30/2022 (Originally 11/14/2021)   MAMMOGRAM  02/28/2022   HEMOGLOBIN A1C  04/03/2022   FOOT EXAM  10/02/2022   Diabetic kidney evaluation - GFR measurement  11/22/2022   OPHTHALMOLOGY EXAM  12/05/2022   TETANUS/TDAP  02/20/2028   COLONOSCOPY (Pts 45-26yrs Insurance coverage will need to be confirmed)  04/26/2030   INFLUENZA VACCINE  Completed   COVID-19 Vaccine  Completed   Hepatitis C Screening  Completed   HIV Screening  Completed   HPV VACCINES  Aged Out      Review of Systems Patient reports no vision/ hearing changes, adenopathy,fever, weight change,  persistant/recurrent hoarseness , swallowing issues, chest pain, palpitations, edema, persistant/recurrent cough, hemoptysis, dyspnea (rest/exertional/paroxysmal nocturnal), gastrointestinal bleeding (melena, rectal bleeding), abdominal pain, significant heartburn, bowel changes, GU symptoms (dysuria, hematuria, incontinence), Gyn symptoms (abnormal  bleeding, pain),  syncope, focal weakness, memory loss, numbness & tingling, skin/hair/nail changes, abnormal bruising or bleeding, anxiety, or depression.   Bilateral lower leg pain- pt reports she will lie down at night and after a few hours will have burning lower leg pain bilaterally.   Has to get up and sleep in recliner.  Pain only occurs w/ lying down.  Hx of L4/L5 back surgery.    Objective:   Physical Exam General Appearance:    Alert, cooperative, no distress, appears stated age, obese  Head:    Normocephalic, without obvious abnormality, atraumatic  Eyes:    PERRL, conjunctiva/corneas clear, EOM's intact both eyes  Ears:    Normal TM's and external ear canals, both ears  Nose:   Nares normal, septum midline, mucosa normal, no drainage    or sinus tenderness  Throat:   Lips, mucosa, and tongue normal; teeth and gums normal  Neck:   Supple, symmetrical, trachea midline, no adenopathy;    Thyroid: no enlargement/tenderness/nodules  Back:     Symmetric, no curvature, ROM normal, no CVA tenderness  Lungs:     Clear to auscultation bilaterally, respirations unlabored  Chest Wall:    No tenderness or deformity   Heart:    Regular rate and rhythm, S1 and S2 normal, no murmur, rub   or gallop  Breast Exam:    Deferred to GYN  Abdomen:     Soft, non-tender, bowel sounds active all four quadrants,    no masses, no organomegaly  Genitalia:    Deferred to GYN  Rectal:    Extremities:   Extremities normal, atraumatic, no cyanosis or edema  Pulses:   2+ and symmetric all extremities  Skin:   Skin color, texture, turgor normal, no rashes or lesions  Lymph nodes:   Cervical, supraclavicular, and axillary nodes normal  Neurologic:   CNII-XII intact, normal strength, sensation  and reflexes    throughout          Assessment & Plan:

## 2022-02-27 NOTE — Progress Notes (Signed)
Patient ID: [name], female   DOB: 07-May-1971, 51 y.o.   MRN: [id number]  HPI: [name] is a 51 y.o.-year-old female, presenting for f/u for DM2, dx in 2001, non-insulin-dependent, uncontrolled, without long term complications. Last visit 5 months ago.  Interim history: No increased urination, blurry vision, nausea, chest pain. She did not check sugars since last OV - started this week. She started walking with her husband recently.  Reviewed HbA1c levels: Lab Results  Component Value Date   HGBA1C 8.4 (A) 10/01/2021   HGBA1C 9.7 (A) 06/13/2021   HGBA1C 6.4 (A) 12/11/2020   HGBA1C 6.6 (A) 08/16/2020   HGBA1C 8.1 (A) 12/15/2019   HGBA1C 7.6 (H) 07/12/2019   HGBA1C 6.7 (A) 11/30/2018   HGBA1C 8.5 (A) 07/26/2018   HGBA1C 9.9 (H) 04/15/2018   HGBA1C 7.6 08/03/2017   HGBA1C 6.6 (H) 12/30/2016   HGBA1C 6.1 (H) 10/09/2016   HGBA1C 9.6 02/21/2016   HGBA1C 9.2 10/05/2015   HGBA1C 10.4 05/31/2015   HGBA1C 8.8 01/09/2015   HGBA1C 11.0 (H) 06/21/2014   HGBA1C 8.8 (H) 01/03/2014   HGBA1C 9.6 (H) 04/12/2013   HGBA1C 11.0 (H) 11/18/2012   Pt was on a regimen of: - Metformin 2000 mg at dinnertime - Lantus 63 units at bedtime - Victoza 1.8 mg daily - Amaryl 4 mg 2x a day She had frequent yeast inf when sugars were higher before.   Pt is now on a regimen of: - Metformin 500 mg 2x a day >> 1000 mg with dinner >> 1000 mg 2x a day - Trulicity -initially had nausea, then resolved: 1.5 mg weekly   She is checking sugars 0-1x a day: - am:  87-128 >> 120-130, occas. 150 - pizza >> 138-196, 216 - 2h after b'fast: n/c >> 71-100 >> 83-99 >> n/c >> 105-161 - before lunch: n/c >> 90, 93 >> 76-102 >> 125-130s >> 96-130 - 2h after lunch: n/c >> 84, 192 >> 80-94 >> 130 >> 111-157 - before dinner: 120-150 >> n/c >> 95, 99 >> 85-111 >> n/c 192 - 2h after dinner: 83-101 >> 100-120 >> n/c  >> 142, 160 - bedtime:  1 118-160 >> 91-133 >> 150s, occas. 200 >> n/c - nighttime:  n/c Lowest sugar was 34 (after Sx - while still on insulin) >> ... 96; she has hypoglycemia awareness in the 90s. Highest sugar was 330 >> ... 225 x1 >> 216.  Glucometer: One Touch Ultra 2 >> FreeStyle Lite   -No CKD, last BUN/creatinine:  Lab Results  Component Value Date   BUN 16 11/21/2021   CREATININE 0.60 11/21/2021  On lisinopril 20.  -+ HL; last set of lipids: Lab Results  Component Value Date   CHOL 158 08/29/2021   HDL 60 08/29/2021   LDLCALC 79 08/29/2021   LDLDIRECT 112.0 06/21/2014   TRIG 109 08/29/2021   CHOLHDL 2.6 08/29/2021  On Lipitor 40.  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  She had cataracts but is status post cataract surgery.  Dr. Delman Cheadle.  She sees a retina specialist.  -No numbness and tingling in her feet.  Last foot exam 09/2021.  She had Roux-en-Y gastric bypass surgery in 03/2016.  After this, she lost more than 100 pounds but she gained more than 50 pounds back afterwards. She had a syncopal episode in [place] in 09/2020, after staying in the sun for 3 hours.  At that time, she went to the emergency room.  She was given IV fluids.  EKG was normal.  HCTZ was stopped and lisinopril was increased.  Next day, she tested positive for COVID.  This was complicated by bronchitis > had ABx. She had a previous syncopal episode in [place] in 03/2020.  ROS: + see HPI  I reviewed pt's medications, allergies, PMH, social hx, family hx, and changes were documented in the history of present illness. Otherwise, unchanged from my initial visit note.  Past Medical History:  Diagnosis Date   Allergy    seasonal allergies   Anemia    hx of   Anxiety    on meds   Asthma    hx of   Bipolar disorder ([place])    Cataract    bilateral sx   Chicken pox    Complication of anesthesia    oxygen saturation dropped after hysterectomy 2009 at [facility]   Depression    on meds   Diabetes mellitus    on meds   Dyspnea    GERD  (gastroesophageal reflux disease)    hx of   Hyperlipidemia    on meds   Hypertension    readings   Hypothyroidism    hx of-LEFT thyroid removed   Irregular heartbeat    saw dr Ron Parker sees -cardiology as needed   Migraine    Sleep apnea    uses cpap   Past Surgical History:  Procedure Laterality Date   ANKLE SURGERY Left [place]   LAPAROSCOPIC ASSISTED VAGINAL HYSTERECTOMY  2009     BSO fibroids, DUB, pelvic pain   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 REPAIR N/A 04/14/2016   Procedure: LAPAROSCOPIC ROUX-EN-Y GASTRIC BYPASS  WITH UPPER ENDOSCOPY;  Surgeon: Excell Seltzer, MD;  Location: [facility];  Service: General;  Laterality: N/A;   SHOULDER ARTHROSCOPY WITH OPEN ROTATOR CUFF REPAIR Right 01/02/2017   Procedure: Right shoulder arthroscopy, A-subcromial decompression, mini open rotator cuff repair, open distal clavicle resection, biceps tenodesis;  Surgeon: Netta Cedars, MD;  Location: [facility];  Service: Orthopedics;  Laterality: Right;   THYROIDECTOMY, PARTIAL Left [id number]   UMBILICAL HERNIA REPAIR  2003   WISDOM TOOTH EXTRACTION     Social History   Social History   Marital Status: Married    Spouse Name: N/A   Number of Children: 1   Occupational History   Museum/gallery curator, Public house manager   Social History Main Topics   Smoking status: Never Smoker    Smokeless tobacco: Never Used   Alcohol Use: No   Drug Use: No   Current Outpatient Medications on File Prior to Visit  Medication Sig Dispense Refill   ARIPiprazole (ABILIFY) 10 MG tablet Take 10 mg by mouth daily.   1   ASPIRIN PO Take 162 mg by mouth daily.     atorvastatin (LIPITOR) 40 MG tablet Take 1 tablet (40 mg total) by mouth daily. 90 tablet 1   Biotin 10000 MCG TABS Take 1 tablet by mouth daily.     Blood Glucose Monitoring Suppl (FREESTYLE LITE) w/Device KIT Use to check blood sugar 2 times daily 1 kit 0    Calcium Carbonate-Vit D-Min (CALTRATE 600+D PLUS MINIS PO) Take 1 tablet by mouth 3 (three) times daily.     carbamazepine (TEGRETOL XR) 200 MG 12 hr tablet 1 tablet in the morning, 2 at night     cyclobenzaprine (FLEXERIL)  10 MG tablet Take 1 tablet (10 mg total) by mouth at bedtime. 30 tablet 3   Dulaglutide (TRULICITY) 1.5 FG/1.8EX SOPN INJECT 1.5MG INTO THE SKIN ONCE A WEEK 6 mL 3   gabapentin (NEURONTIN) 600 MG tablet Take 1 tablet by mouth three times a day 270 tablet 0   Glucosamine HCl (GLUCOSAMINE PO) Take 3 tablets by mouth daily.     glucose blood (FREESTYLE LITE) test strip Use to check blood sugar 2 times daily 100 each 0   lamoTRIgine (LAMICTAL) 200 MG tablet Take 200 mg by mouth 2 (two) times daily.     Lancets (FREESTYLE) lancets Use as instructed to check sugar 2 times daily 200 each 5   lisinopril (ZESTRIL) 40 MG tablet Take 1 tablet (40 mg total) by mouth daily. 90 tablet 1   Loratadine (CLARITIN PO) Take by mouth daily.     metFORMIN (GLUCOPHAGE) 1000 MG tablet Take 1 tablet (1,000 mg total) by mouth 2 (two) times daily with a meal. 180 tablet 3   Multiple Vitamin (MULTIVITAMIN) tablet Take 1 tablet by mouth daily.     spironolactone (ALDACTONE) 25 MG tablet Take 1 tablet (25 mg total) by mouth daily. 90 tablet 1   TEGRETOL-XR 200 MG 12 hr tablet Take 1 tablet by mouth every morning and take 2 tablets at bedtime 270 tablet 0   tiaGABine (GABITRIL) 4 MG tablet Take 8 mg by mouth 2 (two) times daily.     tiaGABine (GABITRIL) 4 MG tablet Take 2 tablets by mouth twice daily 360 tablet 0   Turmeric (QC TUMERIC COMPLEX PO) Take by mouth daily.     Current Facility-Administered Medications on File Prior to Visit  Medication Dose Route Frequency Provider Last Rate Last Admin   0.9 %  sodium chloride infusion  500 mL Intravenous Once Nandigam, Venia Minks, MD       Allergies  Allergen Reactions   Zofran [Ondansetron] Swelling   Zofran [Ondansetron Hcl] Other (See Comments)     Severe headache   Family History  Problem Relation Age of Onset   Hyperlipidemia Mother    Diabetes Mother    Anxiety disorder Mother    Heart disease Mother    Hypertension Father    Lupus Father    Heart disease Father    Asthma Father    Colon polyps Father 3   Stroke Maternal Aunt    Stroke Paternal Grandfather    Mental illness Paternal Grandfather    Breast cancer Neg Hx    Colon cancer Neg Hx    Esophageal cancer Neg Hx    Stomach cancer Neg Hx    Rectal cancer Neg Hx    PE: BP 136/88 (BP Location: Left Arm, Patient Position: Sitting, Cuff Size: Normal)   Pulse 86   Ht '5\' 8"'  (1.727 m)   Wt 291 lb 3.2 oz (132.1 kg)   LMP 01/25/2008   SpO2 98%   BMI 44.28 kg/m  Wt Readings from Last 3 Encounters:  02/27/22 291 lb 3.2 oz (132.1 kg)  02/27/22 291 lb 8 oz (132.2 kg)  11/21/21 295 lb 6 oz (134 kg)   Constitutional: overweight, in NAD Eyes:  EOMI, no exophthalmos ENT: no neck masses, no cervical lymphadenopathy Cardiovascular: RRR, No MRG Respiratory: CTA B Musculoskeletal: no deformities Skin:no rashes Neurological: no tremor with outstretched hands  ASSESSMENT: 1. DM2, insulin-independent, uncontrolled, without long-term complications  2. Obesity class III  3. HL  PLAN:  1. Patient with longstanding, previously  uncontrolled type 2 diabetes, on a complex medication regimen, but with improvement in control after gastric bypass in 2017 at losing more than 100 pounds.  However, she started to gain weight afterwards and sugars worsened and HbA1c increased to 9.7%.  Liver appeared to be better at last visit, after starting Trulicity.  I advised her to continue on the same dose, since she only started this prior to our appointment but discussed about increasing the dose if sugars did not improve.  At last visit, she was also planning to start walking again for exercise.  HbA1c at that time was better, at 8.4%. - at this visit, she mentions that she just started to  improve her diet and she also started to walk with her husband.  She was not checking blood sugars until the beginning of this week.  When she started to check, they are high in the morning, up to 200s but improving later in the day.  She will continue with exercise and improving her diet.  However, we will also increase her Trulicity dose, especially with the holidays coming up.  She tolerates the lower dose well. - I advised her to:  Patient Instructions  Please continue: - Metformin 1000 mg 2x a day  Please increase: - Trulicity 3 mg weekly  Please come back for a follow-up appointment in 4 months.  - we checked her HbA1c: 8.1% (lower) - advised to check sugars at different times of the day - 1x a day, rotating check times - advised for yearly eye exams >> she is UTD - return to clinic in 4 months     2. Obesity class III -She has a history of Roux-en-Y gastric bypass surgery, after which she lost a significant amount of weight but she started to gain weight back afterwards.   -She gained 7 pounds before last visit -She continues on Trulicity, which should also help with weight loss -We discussed at last visit about restarting exercise -She lost 4 pounds since last visit  3. HL -Reviewed her latest lipid panel from 08/2021: Fractions at goal: Lab Results  Component Value Date   CHOL 158 08/29/2021   HDL 60 08/29/2021   LDLCALC 79 08/29/2021   LDLDIRECT 112.0 06/21/2014   TRIG 109 08/29/2021   CHOLHDL 2.6 08/29/2021  -continues Lipitor 40 mg daily without side effects  Philemon Kingdom, MD PhD [facility]

## 2022-02-27 NOTE — Patient Instructions (Addendum)
Follow up in 6 months to recheck BP and cholesterol We'll notify you of your lab results and make any changes if needed We'll call you to schedule your Neurology appt for the leg burning Continue to work on healthy diet and regular exercise- you can do it! Call with any questions or concerns Stay Safe!  Stay Healthy! ENJOY YOUR TRIP!!!

## 2022-02-27 NOTE — Addendum Note (Signed)
Addended by: Sarina Ill on: 02/27/2022 03:28 PM   Modules accepted: Orders

## 2022-02-27 NOTE — Assessment & Plan Note (Signed)
Pt's PE WNL w/ exception of BMI.  UTD on mammo, colonoscopy, foot exam, eye exam, flu.  Has appt w/ Endo this afternoon.  Check labs.  Anticipatory guidance provided.

## 2022-02-27 NOTE — Patient Instructions (Signed)
Please continue: - Metformin 1000 mg 2x a day  Please increase: - Trulicity 3 mg weekly  Please come back for a follow-up appointment in 4 months.

## 2022-02-28 ENCOUNTER — Encounter: Payer: Self-pay | Admitting: Family Medicine

## 2022-02-28 ENCOUNTER — Other Ambulatory Visit (HOSPITAL_BASED_OUTPATIENT_CLINIC_OR_DEPARTMENT_OTHER): Payer: Self-pay

## 2022-02-28 ENCOUNTER — Telehealth: Payer: Self-pay

## 2022-02-28 ENCOUNTER — Other Ambulatory Visit: Payer: Self-pay

## 2022-02-28 DIAGNOSIS — E871 Hypo-osmolality and hyponatremia: Secondary | ICD-10-CM

## 2022-02-28 NOTE — Telephone Encounter (Signed)
Dr Cruzita Lederer agrees that pt needs to decrease water intake.  I would keep water to around 40oz/day until we can adjust some of her other medications (which are managed by psych and not either Dr Cruzita Lederer or myself)  I will be sending them a note as well

## 2022-02-28 NOTE — Telephone Encounter (Signed)
Spoke wt/ pt and she states she will call [facility] office on Monday and advise them of her lab results

## 2022-02-28 NOTE — Telephone Encounter (Signed)
-----   Message from Midge Minium, MD sent at 02/28/2022  7:38 AM EDT ----- Your sodium is again low which is likely the cause of your leg cramps.  I know in the past this improved when you decreased your water intake.  I am also going to send these results to Dr Cruzita Lederer to review and make any recommendations.  We will need to repeat your BMP at a lab only visit next week (dx hyponatremia)  Remainder of labs look good!

## 2022-02-28 NOTE — Progress Notes (Signed)
Patient reached out via mychart regarding results. Message was sent to Dr Birdie Riddle

## 2022-02-28 NOTE — Telephone Encounter (Signed)
I am happy to send a letter to [name] but I am not sure how long it will take to get there.  I think pt should also plan on calling on Monday so prescriber is aware of the situation (and we may get a quicker resolution)

## 2022-02-28 NOTE — Telephone Encounter (Signed)
Spoke to the pt and advised it may be quicker for the pt to call Mrs Ronnald Ramp instead of [place] sending a letter out. Pt stated she will call [facility]ine Good on Monday and let us know what she does about her medication

## 2022-02-28 NOTE — Telephone Encounter (Signed)
Left pt a vm to call office in regards to lab results . She will need a lab only visit next week  apt . I have placed the lab order in

## 2022-03-04 ENCOUNTER — Other Ambulatory Visit: Payer: Self-pay | Admitting: Cardiovascular Disease

## 2022-03-04 ENCOUNTER — Other Ambulatory Visit (HOSPITAL_BASED_OUTPATIENT_CLINIC_OR_DEPARTMENT_OTHER): Payer: Self-pay

## 2022-03-04 ENCOUNTER — Encounter: Payer: Self-pay | Admitting: Neurology

## 2022-03-04 ENCOUNTER — Encounter: Payer: Self-pay | Admitting: Family Medicine

## 2022-03-04 MED ORDER — SPIRONOLACTONE 25 MG PO TABS
25.0000 mg | ORAL_TABLET | Freq: Every day | ORAL | 1 refills | Status: DC
  Filled 2022-03-04: qty 90, 90d supply, fill #0
  Filled 2022-06-04: qty 90, 90d supply, fill #1

## 2022-03-05 ENCOUNTER — Other Ambulatory Visit (HOSPITAL_BASED_OUTPATIENT_CLINIC_OR_DEPARTMENT_OTHER): Payer: Self-pay

## 2022-03-05 DIAGNOSIS — F4320 Adjustment disorder, unspecified: Secondary | ICD-10-CM | POA: Diagnosis not present

## 2022-03-06 ENCOUNTER — Other Ambulatory Visit (INDEPENDENT_AMBULATORY_CARE_PROVIDER_SITE_OTHER): Payer: 59

## 2022-03-06 DIAGNOSIS — E871 Hypo-osmolality and hyponatremia: Secondary | ICD-10-CM

## 2022-03-06 LAB — BASIC METABOLIC PANEL
BUN: 12 mg/dL (ref 6–23)
CO2: 28 mEq/L (ref 19–32)
Calcium: 9.2 mg/dL (ref 8.4–10.5)
Chloride: 97 mEq/L (ref 96–112)
Creatinine, Ser: 0.59 mg/dL (ref 0.40–1.20)
GFR: 105.17 mL/min (ref >60.00)
Glucose, Bld: 231 mg/dL — ABNORMAL HIGH (ref 70–99)
Potassium: 5.1 mEq/L (ref 3.5–5.1)
Sodium: 133 mEq/L — ABNORMAL LOW (ref 135–145)

## 2022-03-06 NOTE — Progress Notes (Signed)
Informed pt of lab results  

## 2022-03-07 ENCOUNTER — Other Ambulatory Visit (HOSPITAL_BASED_OUTPATIENT_CLINIC_OR_DEPARTMENT_OTHER): Payer: Self-pay

## 2022-03-07 MED ORDER — GABAPENTIN 600 MG PO TABS
600.0000 mg | ORAL_TABLET | Freq: Three times a day (TID) | ORAL | 0 refills | Status: DC
  Filled 2022-03-17: qty 270, 90d supply, fill #0

## 2022-03-17 ENCOUNTER — Other Ambulatory Visit: Payer: Self-pay | Admitting: Family Medicine

## 2022-03-17 ENCOUNTER — Other Ambulatory Visit (HOSPITAL_BASED_OUTPATIENT_CLINIC_OR_DEPARTMENT_OTHER): Payer: Self-pay

## 2022-03-17 ENCOUNTER — Encounter: Payer: Self-pay | Admitting: Family Medicine

## 2022-03-17 MED ORDER — MOLNUPIRAVIR EUA 200MG CAPSULE
4.0000 | ORAL_CAPSULE | Freq: Two times a day (BID) | ORAL | 0 refills | Status: AC
  Filled 2022-03-17: qty 40, 5d supply, fill #0

## 2022-03-17 MED ORDER — CYCLOBENZAPRINE HCL 10 MG PO TABS
10.0000 mg | ORAL_TABLET | Freq: Every day | ORAL | 3 refills | Status: DC
  Filled 2022-03-17: qty 30, 30d supply, fill #0
  Filled 2022-04-21 – 2022-04-22 (×2): qty 30, 30d supply, fill #1
  Filled 2022-05-21: qty 30, 30d supply, fill #2
  Filled 2022-06-16: qty 30, 30d supply, fill #3

## 2022-03-17 NOTE — Telephone Encounter (Signed)
[facility] VISIT   Patient agreed to [facility] visit and is aware that copayment and coinsurance may apply. Patient was treated using telemedicine according to accepted telemedicine protocols.  Subjective:   Patient complains of COVID  Patient Active Problem List   Diagnosis Date Noted   DDD (degenerative disc disease), lumbar 02/27/2022   Heartburn 11/19/2020   Lumbar pain 03/08/2020   Palpitations 01/13/2020   S/P bariatric surgery 07/12/2019   Type 2 diabetes mellitus without complications ([place]) 94/49/6759   Hypersomnia with sleep apnea 06/20/2015   Right lumbar radiculopathy 16/38/4665   Lichen simplex chronicus 09/21/2013   Benign paroxysmal positional vertigo 02/03/2013   General medical examination 08/06/2011   SINUS TACHYCARDIA 08/22/2008   GERD 08/22/2008   HYPERLIPIDEMIA, MIXED 05/25/2007   Morbid obesity ([place]) 05/25/2007   Bipolar disorder ([place]) 05/25/2007   MIGRAINE, CHRONIC 05/25/2007   Essential hypertension 05/25/2007   Allergic rhinitis 05/25/2007   Obstructive sleep apnea 05/17/2007   Social History   Tobacco Use   Smoking status: Never   Smokeless tobacco: Never  Substance Use Topics   Alcohol use: No    Alcohol/week: 0.0 standard drinks of alcohol    Current Outpatient Medications:    molnupiravir EUA (LAGEVRIO) 200 mg CAPS capsule, Take 4 capsules (800 mg total) by mouth 2 (two) times daily for 5 days., Disp: 40 capsule, Rfl: 0   ARIPiprazole (ABILIFY) 10 MG tablet, Take 10 mg by mouth daily. , Disp: , Rfl: 1   ASPIRIN PO, Take 162 mg by mouth daily., Disp: , Rfl:    atorvastatin (LIPITOR) 40 MG tablet, Take 1 tablet (40 mg total) by mouth daily., Disp: 90 tablet, Rfl: 1   Biotin 10000 MCG TABS, Take 1 tablet by mouth daily., Disp: , Rfl:    Blood Glucose Monitoring Suppl (FREESTYLE LITE) w/Device KIT, Use to check blood sugar 2 times daily, Disp: 1 kit, Rfl: 0   Calcium Carbonate-Vit D-Min (CALTRATE 600+D PLUS MINIS PO), Take 1 tablet by mouth 3 (three)  times daily., Disp: , Rfl:    carbamazepine (TEGRETOL XR) 200 MG 12 hr tablet, 1 tablet in the morning, 2 at night, Disp: , Rfl:    cyclobenzaprine (FLEXERIL) 10 MG tablet, Take 1 tablet (10 mg total) by mouth at bedtime., Disp: 30 tablet, Rfl: 3   Dulaglutide (TRULICITY) 3 LD/3.5TS SOPN, Inject 3 mg into the skin once a week., Disp: 6 mL, Rfl: 3   gabapentin (NEURONTIN) 600 MG tablet, Take 1 tablet (600 mg total) by mouth 3 (three) times daily., Disp: 270 tablet, Rfl: 0   Glucosamine HCl (GLUCOSAMINE PO), Take 3 tablets by mouth daily., Disp: , Rfl:    glucose blood (FREESTYLE LITE) test strip, Use to check blood sugar 2 times daily, Disp: 100 each, Rfl: 0   lamoTRIgine (LAMICTAL) 200 MG tablet, Take 200 mg by mouth 2 (two) times daily., Disp: , Rfl:    Lancets (FREESTYLE) lancets, Use as instructed to check sugar 2 times daily, Disp: 200 each, Rfl: 5   lisinopril (ZESTRIL) 40 MG tablet, Take 1 tablet (40 mg total) by mouth daily., Disp: 90 tablet, Rfl: 1   Loratadine (CLARITIN PO), Take by mouth daily., Disp: , Rfl:    metFORMIN (GLUCOPHAGE) 1000 MG tablet, Take 1 tablet (1,000 mg total) by mouth 2 (two) times daily with a meal., Disp: 180 tablet, Rfl: 3   Multiple Vitamin (MULTIVITAMIN) tablet, Take 1 tablet by mouth daily., Disp: , Rfl:    spironolactone (ALDACTONE) 25 MG tablet, Take  1 tablet (25 mg total) by mouth daily., Disp: 90 tablet, Rfl: 1   TEGRETOL-XR 200 MG 12 hr tablet, Take 1 tablet by mouth every morning and take 2 tablets at bedtime, Disp: 270 tablet, Rfl: 0   tiaGABine (GABITRIL) 4 MG tablet, Take 8 mg by mouth 2 (two) times daily., Disp: , Rfl:    tiaGABine (GABITRIL) 4 MG tablet, Take 2 tablets by mouth twice daily, Disp: 360 tablet, Rfl: 0   Turmeric (QC TUMERIC COMPLEX PO), Take by mouth daily., Disp: , Rfl:   Current Facility-Administered Medications:    0.9 %  sodium chloride infusion, 500 mL, Intravenous, Once, Nandigam, Venia Minks, MD  Allergies  Allergen Reactions    Zofran [Ondansetron] Swelling   Zofran [Ondansetron Hcl] Other (See Comments)    Severe headache    Assessment and Plan:   Diagnosis: COVID. Please see myChart communication and orders below.   No orders of the defined types were placed in this encounter.  Meds ordered this encounter  Medications   molnupiravir EUA (LAGEVRIO) 200 mg CAPS capsule    Sig: Take 4 capsules (800 mg total) by mouth 2 (two) times daily for 5 days.    Dispense:  40 capsule    Refill:  0    Annye Asa, MD 03/17/2022  A total of 6 minutes were spent by me to personally review the patient-generated inquiry, review patient records and data pertinent to assessment of the patient's problem, develop a management plan including generation of prescriptions and/or orders, and on subsequent communication with the patient through secure the MyChart portal service.   There is no separately reported E/M service related to this service in the past 7 days nor does the patient have an upcoming soonest available appointment for this issue. This work was completed in less than 7 days.   The patient consented to this service today (see patient agreement prior to ongoing communication). Patient counseled regarding the need for in-person exam for certain conditions and was advised to call the office if any changing or worsening symptoms occur.   The codes to be used for the E/M service are: _0   99421 for 5-10 minutes of time spent on the inquiry. _1   [id number] for 11-20 minutes. _2   X700321 for 21+ minutes.

## 2022-03-17 NOTE — Telephone Encounter (Signed)
Is the Flexeril Rx for the pt ok to refill ?

## 2022-03-20 ENCOUNTER — Other Ambulatory Visit (HOSPITAL_BASED_OUTPATIENT_CLINIC_OR_DEPARTMENT_OTHER): Payer: Self-pay

## 2022-03-20 DIAGNOSIS — F411 Generalized anxiety disorder: Secondary | ICD-10-CM | POA: Diagnosis not present

## 2022-03-20 DIAGNOSIS — F3132 Bipolar disorder, current episode depressed, moderate: Secondary | ICD-10-CM | POA: Diagnosis not present

## 2022-03-20 MED ORDER — ARIPIPRAZOLE 5 MG PO TABS
5.0000 mg | ORAL_TABLET | Freq: Every day | ORAL | 2 refills | Status: DC
  Filled 2022-03-20: qty 30, 30d supply, fill #0
  Filled 2022-06-04: qty 30, 30d supply, fill #1
  Filled 2022-06-30: qty 30, 30d supply, fill #2

## 2022-03-20 MED ORDER — TIAGABINE HCL 4 MG PO TABS
4.0000 mg | ORAL_TABLET | Freq: Three times a day (TID) | ORAL | 0 refills | Status: DC
  Filled 2022-03-20: qty 270, 90d supply, fill #0

## 2022-03-21 ENCOUNTER — Other Ambulatory Visit (HOSPITAL_BASED_OUTPATIENT_CLINIC_OR_DEPARTMENT_OTHER): Payer: Self-pay

## 2022-03-21 ENCOUNTER — Telehealth (INDEPENDENT_AMBULATORY_CARE_PROVIDER_SITE_OTHER): Payer: 59 | Admitting: Family Medicine

## 2022-03-21 ENCOUNTER — Telehealth: Payer: Self-pay | Admitting: Lab

## 2022-03-21 ENCOUNTER — Encounter: Payer: Self-pay | Admitting: Family Medicine

## 2022-03-21 VITALS — HR 88 | Ht 68.0 in | Wt 290.0 lb

## 2022-03-21 DIAGNOSIS — J22 Unspecified acute lower respiratory infection: Secondary | ICD-10-CM | POA: Diagnosis not present

## 2022-03-21 DIAGNOSIS — U071 COVID-19: Secondary | ICD-10-CM | POA: Diagnosis not present

## 2022-03-21 DIAGNOSIS — H9203 Otalgia, bilateral: Secondary | ICD-10-CM

## 2022-03-21 MED ORDER — AZITHROMYCIN 250 MG PO TABS
ORAL_TABLET | ORAL | 0 refills | Status: AC
  Filled 2022-03-21: qty 6, 5d supply, fill #0

## 2022-03-21 NOTE — Patient Instructions (Signed)
Thank you for joining me on the virtual visit today.  Sorry to hear that you are still struggling with this infection and possible worsening symptoms.  I sent in an antibiotic azithromycin to cover for possible secondary bacterial infection such as bronchitis or less likely pneumonia and that should also cover if there is an ear infection.  Okay to continue Mucinex and other symptomatic care.  Make sure to drink plenty fluids and rest.    If you are not improving into next week, it may be best that we see you in person for an exam and possible x-ray at that time.  If any worsening, be seen sooner including through emergency room or urgent care if needed.  Take care and hope you feel better soon.

## 2022-03-21 NOTE — Progress Notes (Signed)
Virtual Visit via Video Note  I connected with [name] on 03/21/22 at 10:40 AM by a video enabled telemedicine application and verified that I am speaking with the correct person using two identifiers.  Patient location: at home by self in room.  My location: office - [place].    I discussed the limitations, risks, security and privacy concerns of performing an evaluation and management service by telephone and the availability of in person appointments. I also discussed with the patient that there may be a patient responsible charge related to this service. The patient expressed understanding and agreed to proceed, consent obtained  Chief complaint:  Chief Complaint  Patient presents with   Covid Positive    Pt was diagnosed covid positive Saturday and has a cough deep in her chest and coughing up green and both ears are hurting, Dr Birdie Riddle called in meds for Covid but now these symptoms are getting worse     History of Present Illness: [name] is a 51 y.o. female  COVID-19 infection Initially positive: 03/15/2022.  Medication called in via MyChart encounter on October 23rd, molnupiravir.  Note reviewed from 2 days ago.  Some improvement at that time except right earache, and and congestion.  Option of over-the-counter nasal spray initially. Cough and congestion has been the issue. Green phlegm deep in chest. Has been progressively worsening discolored phlegm, no recent fever.  No dyspnea or chest pain, no confusion or difficulty with fluids. No side effects with molnupiravir.  Min relief with mucinex, cough gels, sudafed. Some relief of nasal congestion with saline NS. R greater than left ear pain and pressure.  No ear discharge. Ears feel stopped up.  No treatments.     Patient Active Problem List   Diagnosis Date Noted   DDD (degenerative disc disease), lumbar 02/27/2022   Heartburn 11/19/2020   Lumbar pain 03/08/2020   Palpitations 01/13/2020    S/P bariatric surgery 07/12/2019   Type 2 diabetes mellitus without complications ([place]) 66/59/9357   Hypersomnia with sleep apnea 06/20/2015   Right lumbar radiculopathy 01/77/9390   Lichen simplex chronicus 09/21/2013   Benign paroxysmal positional vertigo 02/03/2013   General medical examination 08/06/2011   SINUS TACHYCARDIA 08/22/2008   GERD 08/22/2008   HYPERLIPIDEMIA, MIXED 05/25/2007   Morbid obesity ([place]) 05/25/2007   Bipolar disorder ([place]) 05/25/2007   MIGRAINE, CHRONIC 05/25/2007   Essential hypertension 05/25/2007   Allergic rhinitis 05/25/2007   Obstructive sleep apnea 05/17/2007   Past Medical History:  Diagnosis Date   Allergy    seasonal allergies   Anemia    hx of   Anxiety    on meds   Asthma    hx of   Bipolar disorder ([place])    Cataract    bilateral sx   Chicken pox    Complication of anesthesia    oxygen saturation dropped after hysterectomy 2009 at [facility]   Depression    on meds   Diabetes mellitus    on meds   Dyspnea    GERD (gastroesophageal reflux disease)    hx of   Hyperlipidemia    on meds   Hypertension    readings   Hypothyroidism    hx of-LEFT thyroid removed   Irregular heartbeat    saw dr Ron Parker sees -cardiology as needed   Migraine    Sleep apnea    uses cpap   Past Surgical History:  Procedure Laterality Date   [place]  BACK SURGERY  1999   CATARACT EXTRACTION, BILATERAL     CESAREAN SECTION     1995   LAPAROSCOPIC ASSISTED VAGINAL HYSTERECTOMY  2009     BSO fibroids, DUB, pelvic pain   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 REPAIR N/A 04/14/2016   Procedure: LAPAROSCOPIC ROUX-EN-Y GASTRIC BYPASS  WITH UPPER ENDOSCOPY;  Surgeon: Excell Seltzer, MD;  Location: [facility];  Service: General;  Laterality: N/A;   SHOULDER ARTHROSCOPY WITH OPEN ROTATOR CUFF REPAIR Right 01/02/2017   Procedure: Right shoulder arthroscopy, A-subcromial decompression, mini open rotator cuff repair, open distal  clavicle resection, biceps tenodesis;  Surgeon: Netta Cedars, MD;  Location: [facility];  Service: Orthopedics;  Laterality: Right;   THYROIDECTOMY, PARTIAL Left 8502   UMBILICAL HERNIA REPAIR  2003   WISDOM TOOTH EXTRACTION     Allergies  Allergen Reactions   Zofran [Ondansetron] Swelling   Zofran [Ondansetron Hcl] Other (See Comments)    Severe headache   Prior to Admission medications   Medication Sig Start Date End Date Taking? Authorizing Provider  ARIPiprazole (ABILIFY) 5 MG tablet Take 1 tablet (5 mg total) by mouth daily. 03/20/22  Yes   ASPIRIN PO Take 162 mg by mouth daily.   Yes [provider]  atorvastatin (LIPITOR) 40 MG tablet Take 1 tablet (40 mg total) by mouth daily. 10/04/21  Yes Midge Minium, MD  Biotin 10000 MCG TABS Take 1 tablet by mouth daily.   Yes [provider]  Blood Glucose Monitoring Suppl (FREESTYLE LITE) w/Device KIT Use to check blood sugar 2 times daily 07/01/21  Yes Philemon Kingdom, MD  Calcium Carbonate-Vit D-Min (CALTRATE 600+D PLUS MINIS PO) Take 1 tablet by mouth 3 (three) times daily.   Yes [provider]  carbamazepine (TEGRETOL XR) 200 MG 12 hr tablet 1 tablet in the morning, 2 at night   Yes [provider]  cyclobenzaprine (FLEXERIL) 10 MG tablet Take 1 tablet (10 mg total) by mouth at bedtime. 03/17/22  Yes Midge Minium, MD  Dulaglutide (TRULICITY) 3 DX/4.1OI SOPN Inject 3 mg into the skin once a week. 02/27/22  Yes Philemon Kingdom, MD  gabapentin (NEURONTIN) 600 MG tablet Take 1 tablet (600 mg total) by mouth 3 (three) times daily. 03/07/22  Yes   Glucosamine HCl (GLUCOSAMINE PO) Take 3 tablets by mouth daily.   Yes [provider]  glucose blood (FREESTYLE LITE) test strip Use to check blood sugar 2 times daily 07/01/21  Yes Philemon Kingdom, MD  lamoTRIgine (LAMICTAL) 200 MG tablet Take 200 mg by mouth 2 (two) times daily.   Yes [provider]  Lancets (FREESTYLE) lancets Use as  instructed to check sugar 2 times daily 12/15/19  Yes Philemon Kingdom, MD  lisinopril (ZESTRIL) 40 MG tablet Take 1 tablet (40 mg total) by mouth daily. 10/11/21  Yes O'Neal, Cassie Freer, MD  Loratadine (CLARITIN PO) Take by mouth daily.   Yes [provider]  metFORMIN (GLUCOPHAGE) 1000 MG tablet Take 1 tablet (1,000 mg total) by mouth 2 (two) times daily with a meal. 02/27/22 02/27/23 Yes Philemon Kingdom, MD  molnupiravir EUA (LAGEVRIO) 200 mg CAPS capsule Take 4 capsules (800 mg total) by mouth 2 (two) times daily for 5 days. 03/17/22 03/22/22 Yes Midge Minium, MD  Multiple Vitamin (MULTIVITAMIN) tablet Take 1 tablet by mouth daily.   Yes [provider]  spironolactone (ALDACTONE) 25 MG tablet Take 1 tablet (25 mg total) by mouth daily. 03/04/22 06/04/22 Yes O'Neal, [place]  Marcello Moores, MD  TEGRETOL-XR 200 MG 12 hr tablet Take 1 tablet by mouth every morning and take 2 tablets at bedtime 09/19/21  Yes   tiaGABine (GABITRIL) 4 MG tablet Take 8 mg by mouth 2 (two) times daily.   Yes [provider]  tiaGABine (GABITRIL) 4 MG tablet Take 1 tablet (4 mg total) by mouth in the morning and 2 tablets (8 mg total) by mouth in the evening. 03/20/22  Yes   Turmeric (QC TUMERIC COMPLEX PO) Take by mouth daily.   Yes [provider]  ARIPiprazole (ABILIFY) 10 MG tablet Take 10 mg by mouth daily.  Patient not taking: Reported on 03/21/2022 08/27/16   [provider]   Social History   Socioeconomic History   Marital status: Married    Spouse name: Not on file   Number of children: 1   Years of education: Not on file   Highest education level: Not on file  Occupational History   Occupation: nursing  Tobacco Use   Smoking status: Never   Smokeless tobacco: Never  Vaping Use   Vaping Use: Never used  Substance and Sexual Activity   Alcohol use: No    Alcohol/week: 0.0 standard drinks of alcohol   Drug use: No   Sexual activity: Yes    Partners: Male   Other Topics Concern   Not on file  Social History Narrative   Not on file   Social Determinants of Health   Financial Resource Strain: Not on file  Food Insecurity: Not on file  Transportation Needs: Not on file  Physical Activity: Not on file  Stress: Not on file  Social Connections: Not on file  Intimate Partner Violence: Not on file    Observations/Objective: Vitals:   03/21/22 1009  Pulse: 88  SpO2: 99%  Weight: 290 lb (131.5 kg)  Height: _0  (1.727 m)   Nontoxic appearance on video, speaking in full sentences without respiratory distress.  No audible wheeze or stridor.  Equal facial movements.  Appropriate responses, coherent responses.  Euthymic mood.  All questions answered with understanding of plan expressed.  Assessment and Plan: LRTI (lower respiratory tract infection) - Plan: azithromycin (ZITHROMAX) 250 MG tablet  Ear pain, bilateral - Plan: azithromycin (ZITHROMAX) 250 MG tablet  COVID-19 virus infection - Plan: azithromycin (ZITHROMAX) 250 MG tablet COVID-19 infection with possible secondary bacterial lower respiratory tract infection given symptoms above, possible secondary otitis media versus pressure/congestion.  Tolerating molnupiravir.  Start azithromycin, potential side effects of antibiotics discussed, continue symptomatic care with Mucinex, other meds as above.  In office evaluation recommended if not improving next week to listen to lungs and check ears with ER/urgent care precautions sooner if worsening.  Follow Up Instructions: As needed.   I discussed the assessment and treatment plan with the patient. The patient was provided an opportunity to ask questions and all were answered. The patient agreed with the plan and demonstrated an understanding of the instructions.   The patient was advised to call back or seek an in-person evaluation if the symptoms worsen or if the condition fails to improve as anticipated.   Wendie Agreste, MD

## 2022-03-21 NOTE — Telephone Encounter (Signed)
Faxed the [facility] 03/21/2022 for a report of her mammogram

## 2022-03-21 NOTE — Telephone Encounter (Signed)
Patient sent an [place]

## 2022-03-24 ENCOUNTER — Other Ambulatory Visit (HOSPITAL_BASED_OUTPATIENT_CLINIC_OR_DEPARTMENT_OTHER): Payer: Self-pay

## 2022-03-28 NOTE — Telephone Encounter (Signed)
No notes required.

## 2022-03-31 ENCOUNTER — Other Ambulatory Visit: Payer: Self-pay | Admitting: Family Medicine

## 2022-03-31 ENCOUNTER — Other Ambulatory Visit: Payer: Self-pay | Admitting: Internal Medicine

## 2022-03-31 ENCOUNTER — Other Ambulatory Visit (HOSPITAL_BASED_OUTPATIENT_CLINIC_OR_DEPARTMENT_OTHER): Payer: Self-pay

## 2022-03-31 MED ORDER — ATORVASTATIN CALCIUM 40 MG PO TABS
40.0000 mg | ORAL_TABLET | Freq: Every day | ORAL | 1 refills | Status: DC
  Filled 2022-03-31: qty 90, 90d supply, fill #0
  Filled 2022-06-30: qty 90, 90d supply, fill #1

## 2022-03-31 MED ORDER — FREESTYLE LITE TEST VI STRP
ORAL_STRIP | 0 refills | Status: DC
  Filled 2022-03-31: qty 100, 50d supply, fill #0

## 2022-04-01 ENCOUNTER — Other Ambulatory Visit: Payer: Self-pay | Admitting: Family Medicine

## 2022-04-01 DIAGNOSIS — Z1231 Encounter for screening mammogram for malignant neoplasm of breast: Secondary | ICD-10-CM

## 2022-04-02 ENCOUNTER — Other Ambulatory Visit (HOSPITAL_BASED_OUTPATIENT_CLINIC_OR_DEPARTMENT_OTHER): Payer: Self-pay

## 2022-04-02 DIAGNOSIS — F3181 Bipolar II disorder: Secondary | ICD-10-CM | POA: Diagnosis not present

## 2022-04-03 ENCOUNTER — Other Ambulatory Visit (HOSPITAL_BASED_OUTPATIENT_CLINIC_OR_DEPARTMENT_OTHER): Payer: Self-pay

## 2022-04-03 ENCOUNTER — Other Ambulatory Visit: Payer: Self-pay | Admitting: Cardiovascular Disease

## 2022-04-03 MED ORDER — LISINOPRIL 40 MG PO TABS
40.0000 mg | ORAL_TABLET | Freq: Every day | ORAL | 1 refills | Status: DC
  Filled 2022-04-03: qty 90, 90d supply, fill #0
  Filled 2022-07-08: qty 90, 90d supply, fill #1

## 2022-04-03 MED ORDER — CARBAMAZEPINE ER 200 MG PO TB12
600.0000 mg | ORAL_TABLET | Freq: Every day | ORAL | 1 refills | Status: DC
  Filled 2022-04-03: qty 270, 90d supply, fill #0
  Filled 2022-06-27: qty 270, 90d supply, fill #1

## 2022-04-09 ENCOUNTER — Other Ambulatory Visit (HOSPITAL_BASED_OUTPATIENT_CLINIC_OR_DEPARTMENT_OTHER): Payer: Self-pay

## 2022-04-10 DIAGNOSIS — F319 Bipolar disorder, unspecified: Secondary | ICD-10-CM | POA: Diagnosis not present

## 2022-04-22 ENCOUNTER — Other Ambulatory Visit (HOSPITAL_BASED_OUTPATIENT_CLINIC_OR_DEPARTMENT_OTHER): Payer: Self-pay

## 2022-04-30 DIAGNOSIS — F3181 Bipolar II disorder: Secondary | ICD-10-CM | POA: Diagnosis not present

## 2022-05-29 ENCOUNTER — Encounter: Payer: Self-pay | Admitting: Family Medicine

## 2022-05-29 ENCOUNTER — Other Ambulatory Visit: Payer: Self-pay

## 2022-05-29 ENCOUNTER — Other Ambulatory Visit (HOSPITAL_BASED_OUTPATIENT_CLINIC_OR_DEPARTMENT_OTHER): Payer: Self-pay

## 2022-05-29 MED ORDER — CLOBETASOL PROPIONATE 0.05 % EX OINT
TOPICAL_OINTMENT | CUTANEOUS | 1 refills | Status: AC
  Filled 2022-05-29: qty 60, 7d supply, fill #0

## 2022-06-05 ENCOUNTER — Ambulatory Visit
Admission: RE | Admit: 2022-06-05 | Discharge: 2022-06-05 | Disposition: A | Discharge status: Home / Self Care | Payer: Commercial Managed Care - PPO | Source: Ambulatory Visit | Attending: Family Medicine | Admitting: Family Medicine

## 2022-06-05 DIAGNOSIS — Z1231 Encounter for screening mammogram for malignant neoplasm of breast: Secondary | ICD-10-CM

## 2022-06-11 DIAGNOSIS — F3181 Bipolar II disorder: Secondary | ICD-10-CM | POA: Diagnosis not present

## 2022-06-12 ENCOUNTER — Other Ambulatory Visit (HOSPITAL_BASED_OUTPATIENT_CLINIC_OR_DEPARTMENT_OTHER): Payer: Self-pay

## 2022-06-12 MED ORDER — GABAPENTIN 600 MG PO TABS
600.0000 mg | ORAL_TABLET | Freq: Three times a day (TID) | ORAL | 0 refills | Status: DC
  Filled 2022-06-12: qty 270, 90d supply, fill #0

## 2022-06-12 MED ORDER — TIAGABINE HCL 4 MG PO TABS: ORAL_TABLET | ORAL | 0 refills | Status: DC

## 2022-06-12 MED ORDER — TIAGABINE HCL 4 MG PO TABS
ORAL_TABLET | ORAL | 0 refills | Status: DC
  Filled 2022-06-12: qty 270, 90d supply, fill #0

## 2022-06-12 MED ORDER — LAMOTRIGINE 200 MG PO TABS
200.0000 mg | ORAL_TABLET | Freq: Every day | ORAL | 0 refills | Status: DC
  Filled 2022-06-12: qty 60, 60d supply, fill #0
  Filled 2022-06-12: qty 30, 30d supply, fill #0
  Filled 2022-06-12: qty 90, 90d supply, fill #0

## 2022-06-12 MED ORDER — ARIPIPRAZOLE 5 MG PO TABS
5.0000 mg | ORAL_TABLET | Freq: Every day | ORAL | 2 refills | Status: DC
  Filled 2022-06-12 – 2022-07-28 (×2): qty 30, 30d supply, fill #0
  Filled 2022-09-08: qty 30, 30d supply, fill #1
  Filled 2022-10-02: qty 30, 30d supply, fill #2

## 2022-06-13 ENCOUNTER — Other Ambulatory Visit (HOSPITAL_BASED_OUTPATIENT_CLINIC_OR_DEPARTMENT_OTHER): Payer: Self-pay

## 2022-06-27 ENCOUNTER — Other Ambulatory Visit (HOSPITAL_BASED_OUTPATIENT_CLINIC_OR_DEPARTMENT_OTHER): Payer: Self-pay

## 2022-07-02 DIAGNOSIS — F3181 Bipolar II disorder: Secondary | ICD-10-CM | POA: Diagnosis not present

## 2022-07-03 ENCOUNTER — Ambulatory Visit: Payer: 59 | Admitting: Internal Medicine

## 2022-07-16 ENCOUNTER — Other Ambulatory Visit: Payer: Self-pay | Admitting: Family Medicine

## 2022-07-16 ENCOUNTER — Other Ambulatory Visit (HOSPITAL_BASED_OUTPATIENT_CLINIC_OR_DEPARTMENT_OTHER): Payer: Self-pay

## 2022-07-16 MED ORDER — CYCLOBENZAPRINE HCL 10 MG PO TABS
10.0000 mg | ORAL_TABLET | Freq: Every day | ORAL | 3 refills | Status: DC
  Filled 2022-07-16: qty 30, 30d supply, fill #0
  Filled 2022-08-14: qty 30, 30d supply, fill #1
  Filled 2022-09-12: qty 30, 30d supply, fill #2
  Filled 2022-09-25 – 2022-10-06 (×3): qty 30, 30d supply, fill #3

## 2022-07-22 NOTE — Progress Notes (Unsigned)
NEUROLOGY CONSULTATION NOTE  [name] MRN: [id number] DOB: [birth year]  Referring provider: Annye Asa, MD Primary care provider: Annye Asa, MD  Reason for consult:  pain in legs  Assessment/Plan:   1  Bilateral leg pain - she does likely have underlying diabetic polyneuropathy and may separately have nocturnal leg cramps.  However, she may have concomitant restless leg, although not a classic presentation. 2  Hypertension  As she is already on higher doses of gabapentin for mood, I would like to try a trial of ropinirole to see if it relieves symptoms. Will also check ferritin level PCP may want to check vascular studies just to rule out other possible etiologies. Follow up 4-5 months. Follow up with PCP regarding blood pressure.    Subjective:  [name] is a 52 year old female with hypertension, DM II, depression and Bipolar disorder who presents for bilateral leg pain.  History supplemented by referring provider's note.  She began experiencing bilateral leg discomfort about 1 and 1/2 years ago.  When she lays down in bed, she develops burning from her knees down to her feet.  She also develops muscle cramps or "charley horses" from her inner thighs down her legs.  Within an hour, she needs to get out of bed.  Moving her legs in the bed isn't too helpful but when she gets up to walk around, the symptoms resolve within 5 minutes.  Denies low back pain or radicular pain down the legs.  Sometimes laying on her left side may help delay discomfort of the leg cramps.  She has resorted to sleeping in the recliner.  She does have type 2 diabetes.  Hgb A1c from October was 81.  Over the past year, labs revealed TSH 1.35, B12 343, folate >24.2 and Mg 1.7.  CBC has not revealed anemia and BMP and hepatic panel have not revealed kidney or liver dysfunction. She started taking cyclobenzaprine at night to help with the leg cramps.  She already takes gabapentin '800mg'$  three  times daily for mood stabilizer.     PAST MEDICAL HISTORY: Past Medical History:  Diagnosis Date   Allergy    seasonal allergies   Anemia    hx of   Anxiety    on meds   Asthma    hx of   Bipolar disorder ([place])    Cataract    bilateral sx   Chicken pox    Complication of anesthesia    oxygen saturation dropped after hysterectomy 2009 at [facility]   Depression    on meds   Diabetes mellitus    on meds   Dyspnea    GERD (gastroesophageal reflux disease)    hx of   Hyperlipidemia    on meds   Hypertension    readings   Hypothyroidism    hx of-LEFT thyroid removed   Irregular heartbeat    saw dr Ron Parker sees -cardiology as needed   Migraine    Sleep apnea    uses cpap    PAST SURGICAL HISTORY: Past Surgical History:  Procedure Laterality Date   ANKLE SURGERY Left 1989   BACK SURGERY  1999   CATARACT EXTRACTION, BILATERAL     CESAREAN SECTION     1995   LAPAROSCOPIC ASSISTED VAGINAL HYSTERECTOMY  2009     BSO fibroids, DUB, pelvic pain   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 REPAIR N/A 04/14/2016   Procedure: LAPAROSCOPIC ROUX-EN-Y GASTRIC BYPASS  WITH UPPER ENDOSCOPY;  Surgeon:  Excell Seltzer, MD;  [facility];  Service: General;  Laterality: N/A;   SHOULDER ARTHROSCOPY WITH OPEN ROTATOR CUFF REPAIR Right 01/02/2017   Procedure: Right shoulder arthroscopy, A-subcromial decompression, mini open rotator cuff repair, open distal clavicle resection, biceps tenodesis;  Surgeon: Netta Cedars, MD;  [facility];  Service: Orthopedics;  Laterality: Right;   THYROIDECTOMY, PARTIAL Left [id number]   UMBILICAL HERNIA REPAIR  2003   WISDOM TOOTH EXTRACTION      MEDICATIONS: Current Outpatient Medications on File Prior to Visit  Medication Sig Dispense Refill   ARIPiprazole (ABILIFY) 10 MG tablet Take 10 mg by mouth daily.  (Patient not taking: Reported on 03/21/2022)  1   ARIPiprazole (ABILIFY) 5 MG tablet Take 1 tablet (5 mg total) by mouth daily. 30  tablet 2   ARIPiprazole (ABILIFY) 5 MG tablet Take 1 tablet (5 mg total) by mouth daily. 30 tablet 2   ASPIRIN PO Take 162 mg by mouth daily.     atorvastatin (LIPITOR) 40 MG tablet Take 1 tablet (40 mg total) by mouth daily. 90 tablet 1   Biotin 10000 MCG TABS Take 1 tablet by mouth daily.     Blood Glucose Monitoring Suppl (FREESTYLE LITE) w/Device KIT Use to check blood sugar 2 times daily 1 kit 0   Calcium Carbonate-Vit D-Min (CALTRATE 600+D PLUS MINIS PO) Take 1 tablet by mouth 3 (three) times daily.     carbamazepine (TEGRETOL XR) 200 MG 12 hr tablet 1 tablet in the morning, 2 at night     carbamazepine (TEGRETOL-XR) 200 MG 12 hr tablet Take 1 tablet (200 mg total) by mouth in the morning and then take 2 tablets (400 mg total) by mouth at bedtime. 270 tablet 1   clobetasol ointment (TEMOVATE) [id number] % APPLY 1 APPLICATION ON TO THE SKIN 2 TIMES DAILY. DO NOT USE FOR MORE THAN 7 DAYS 60 g 1   cyclobenzaprine (FLEXERIL) 10 MG tablet Take 1 tablet (10 mg total) by mouth at bedtime. 30 tablet 3   Dulaglutide (TRULICITY) 3 0000000 SOPN Inject 3 mg into the skin once a week. 6 mL 3   gabapentin (NEURONTIN) 600 MG tablet Take 1 tablet (600 mg total) by mouth in the morning, at noon, and at bedtime. 270 tablet 0   Glucosamine HCl (GLUCOSAMINE PO) Take 3 tablets by mouth daily.     glucose blood (FREESTYLE LITE) test strip Use to check blood sugar 2 times daily 100 each 0   lamoTRIgine (LAMICTAL) 200 MG tablet Take 200 mg by mouth 2 (two) times daily.     lamoTRIgine (LAMICTAL) 200 MG tablet Take 1 tablet (200 mg total) by mouth at bedtime. 90 tablet 0   Lancets (FREESTYLE) lancets Use as instructed to check sugar 2 times daily 200 each 5   lisinopril (ZESTRIL) 40 MG tablet Take 1 tablet (40 mg total) by mouth daily. 90 tablet 1   Loratadine (CLARITIN PO) Take by mouth daily.     metFORMIN (GLUCOPHAGE) 1000 MG tablet Take 1 tablet (1,000 mg total) by mouth 2 (two) times daily with a meal. 180 tablet 3    Multiple Vitamin (MULTIVITAMIN) tablet Take 1 tablet by mouth daily.     spironolactone (ALDACTONE) 25 MG tablet Take 1 tablet (25 mg total) by mouth daily. 90 tablet 1   TEGRETOL-XR 200 MG 12 hr tablet Take 1 tablet by mouth every morning and take 2 tablets at bedtime 270 tablet 0   tiaGABine (GABITRIL) 4 MG tablet Take  8 mg by mouth 2 (two) times daily.     tiaGABine (GABITRIL) 4 MG tablet Take 1 tablet (4 mg total) by mouth in the morning AND 2 tablets (8 mg total) at bedtime. 270 tablet 0   tiaGABine (GABITRIL) 4 MG tablet Take 1 tablet (4 mg total) by mouth in the morning AND 2 tablets (8 mg total) at bedtime. 270 tablet 0   Turmeric (QC TUMERIC COMPLEX PO) Take by mouth daily.     Current Facility-Administered Medications on File Prior to Visit  Medication Dose Route Frequency Provider Last Rate Last Admin   0.9 %  sodium chloride infusion  500 mL Intravenous Once Nandigam, Venia Minks, MD        ALLERGIES: Allergies  Allergen Reactions   Zofran [Ondansetron] Swelling   Zofran [Ondansetron Hcl] Other (See Comments)    Severe headache    FAMILY HISTORY: Family History  Problem Relation Age of Onset   Hyperlipidemia Mother    Diabetes Mother    Anxiety disorder Mother    Heart disease Mother    Hypertension Father    Lupus Father    Heart disease Father    Asthma Father    Colon polyps Father 75   Stroke Maternal Aunt    Stroke Paternal Grandfather    Mental illness Paternal Grandfather    Breast cancer Neg Hx    Colon cancer Neg Hx    Esophageal cancer Neg Hx    Stomach cancer Neg Hx    Rectal cancer Neg Hx     Objective:  Blood pressure (!) 176/84, pulse 76, height '5\' 8"'$  (1.727 m), weight 296 lb (134.3 kg), last menstrual period 01/25/2008, SpO2 96 %. General: No acute distress.  Patient appears well-groomed.   Head:  Normocephalic/atraumatic Eyes:  fundi examined but not visualized Neck: supple, no paraspinal tenderness, full range of motion Back: No  paraspinal tenderness Heart: regular rate and rhythm Lungs: Clear to auscultation bilaterally. Vascular: No carotid bruits. Neurological Exam: Mental status: alert and oriented to person, place, and time, speech fluent and not dysarthric, language intact. Cranial nerves: CN I: not tested CN II: pupils equal, round and reactive to light, visual fields intact CN III, IV, VI:  full range of motion, no nystagmus, no ptosis CN V: facial sensation intact. CN VII: upper and lower face symmetric CN VIII: hearing intact CN IX, X: gag intact, uvula midline CN XI: sternocleidomastoid and trapezius muscles intact CN XII: tongue midline Bulk & Tone: normal, no fasciculations. Motor:  muscle strength 5/5 throughout Sensation:  Pinprick and vibratory sensation intact. Deep Tendon Reflexes:  2+ throughout,  toes downgoing.   Finger to nose testing:  Without dysmetria.   Heel to shin:  Without dysmetria.   Gait:  Normal station and stride.  Romberg negative.    Thank you for allowing me to take part in the care of this patient.  Metta Clines, DO  CC: Annye Asa, MD

## 2022-07-23 ENCOUNTER — Ambulatory Visit: Payer: Commercial Managed Care - PPO | Admitting: Neurology

## 2022-07-23 ENCOUNTER — Other Ambulatory Visit (INDEPENDENT_AMBULATORY_CARE_PROVIDER_SITE_OTHER): Payer: Commercial Managed Care - PPO

## 2022-07-23 ENCOUNTER — Other Ambulatory Visit (HOSPITAL_BASED_OUTPATIENT_CLINIC_OR_DEPARTMENT_OTHER): Payer: Self-pay

## 2022-07-23 ENCOUNTER — Encounter: Payer: Self-pay | Admitting: Neurology

## 2022-07-23 VITALS — BP 176/84 | HR 76 | Ht 68.0 in | Wt 296.0 lb

## 2022-07-23 DIAGNOSIS — E1142 Type 2 diabetes mellitus with diabetic polyneuropathy: Secondary | ICD-10-CM | POA: Diagnosis not present

## 2022-07-23 DIAGNOSIS — G4762 Sleep related leg cramps: Secondary | ICD-10-CM

## 2022-07-23 DIAGNOSIS — G2581 Restless legs syndrome: Secondary | ICD-10-CM

## 2022-07-23 MED ORDER — ROPINIROLE HCL 0.5 MG PO TABS
ORAL_TABLET | ORAL | 0 refills | Status: DC
  Filled 2022-07-23: qty 30, 30d supply, fill #0

## 2022-07-23 NOTE — Patient Instructions (Addendum)
Start ropinirole - take 1/2 tablet 1-2 hours prior to bedtime for one week, then increase to 1 tablet 1-2 hours prior to bedtime Check ferritin level   Check labs today [address]

## 2022-07-24 LAB — FERRITIN: Ferritin: 6.5 ng/mL — ABNORMAL LOW (ref 10.0–291.0)

## 2022-07-29 ENCOUNTER — Other Ambulatory Visit (HOSPITAL_BASED_OUTPATIENT_CLINIC_OR_DEPARTMENT_OTHER): Payer: Self-pay

## 2022-07-29 ENCOUNTER — Telehealth: Payer: Self-pay | Admitting: Anesthesiology

## 2022-07-29 NOTE — Telephone Encounter (Signed)
Pt left message with AN stating she had received Renee's message with lab results.

## 2022-07-30 ENCOUNTER — Encounter: Payer: Self-pay | Admitting: Neurology

## 2022-07-30 DIAGNOSIS — F3181 Bipolar II disorder: Secondary | ICD-10-CM | POA: Diagnosis not present

## 2022-07-30 NOTE — Progress Notes (Signed)
LMOVM for patient to call the office back or send [place] a mychart advising of seeing her results and do not have any questions. If not please give [place] a call and we will go over them with her.

## 2022-08-08 ENCOUNTER — Encounter: Payer: Self-pay | Admitting: Family Medicine

## 2022-08-14 ENCOUNTER — Other Ambulatory Visit (HOSPITAL_BASED_OUTPATIENT_CLINIC_OR_DEPARTMENT_OTHER): Payer: Self-pay

## 2022-08-15 ENCOUNTER — Encounter: Payer: Self-pay | Admitting: Neurology

## 2022-08-18 ENCOUNTER — Other Ambulatory Visit (HOSPITAL_BASED_OUTPATIENT_CLINIC_OR_DEPARTMENT_OTHER): Payer: Self-pay

## 2022-08-20 ENCOUNTER — Other Ambulatory Visit: Payer: Self-pay

## 2022-08-20 DIAGNOSIS — R202 Paresthesia of skin: Secondary | ICD-10-CM

## 2022-08-27 DIAGNOSIS — F3181 Bipolar II disorder: Secondary | ICD-10-CM | POA: Diagnosis not present

## 2022-08-28 ENCOUNTER — Encounter: Payer: Self-pay | Admitting: Family Medicine

## 2022-08-28 ENCOUNTER — Ambulatory Visit: Payer: Commercial Managed Care - PPO | Admitting: Family Medicine

## 2022-08-28 VITALS — BP 136/80 | HR 61 | Temp 98.0°F | Resp 17 | Ht 68.0 in | Wt 299.0 lb

## 2022-08-28 DIAGNOSIS — I10 Essential (primary) hypertension: Secondary | ICD-10-CM

## 2022-08-28 DIAGNOSIS — R252 Cramp and spasm: Secondary | ICD-10-CM | POA: Diagnosis not present

## 2022-08-28 DIAGNOSIS — E782 Mixed hyperlipidemia: Secondary | ICD-10-CM | POA: Diagnosis not present

## 2022-08-28 DIAGNOSIS — Z23 Encounter for immunization: Secondary | ICD-10-CM

## 2022-08-28 NOTE — Progress Notes (Signed)
   Subjective:    Patient ID: [name], female    DOB: [birth year], 52 y.o.   MRN: [id number]  HPI HTN- chronic problem, on Lisinopril 40mg  daily, Spironolactone 25mg  daily w/ adequate control.  Pt reports feeling good.  Denies CP, SOB, HA's visual changes, edema.  Hyperlipidemia- chronic problem, on Lipitor 40mg  daily.  Denies abd pain, N/V.  Leg cramps- pt has upcoming nerve conduction study.  Neuro recommended a vascular workup as so far no explanation for leg cramping.   Review of Systems For ROS see HPI     Objective:   Physical Exam Vitals reviewed.  Constitutional:      General: She is not in acute distress.    Appearance: Normal appearance. She is well-developed. She is obese. She is not ill-appearing.  HENT:     Head: Normocephalic and atraumatic.  Eyes:     Conjunctiva/sclera: Conjunctivae normal.     Pupils: Pupils are equal, round, and reactive to light.  Neck:     Thyroid: No thyromegaly.  Cardiovascular:     Rate and Rhythm: Normal rate and regular rhythm.     Pulses: Normal pulses.     Heart sounds: Normal heart sounds. No murmur heard. Pulmonary:     Effort: Pulmonary effort is normal. No respiratory distress.     Breath sounds: Normal breath sounds.  Abdominal:     General: There is no distension.     Palpations: Abdomen is soft.     Tenderness: There is no abdominal tenderness.  Musculoskeletal:     Cervical back: Normal range of motion and neck supple.     Right lower leg: No edema.     Left lower leg: No edema.  Lymphadenopathy:     Cervical: No cervical adenopathy.  Skin:    General: Skin is warm and dry.  Neurological:     General: No focal deficit present.     Mental Status: She is alert and oriented to person, place, and time.  Psychiatric:        Mood and Affect: Mood normal.        Behavior: Behavior normal.        Thought Content: Thought content normal.           Assessment & Plan:

## 2022-08-28 NOTE — Patient Instructions (Addendum)
Schedule your complete physical in 6 months We'll notify you of your lab results and make any changes if needed We'll call you to schedule your vascular appt Continue to work on healthy diet and regular exercise- you can do it!!! Call with any questions or concerns Stay Safe!  Stay Healthy! Enjoy your trip!!!

## 2022-08-29 ENCOUNTER — Telehealth: Payer: Self-pay

## 2022-08-29 ENCOUNTER — Other Ambulatory Visit: Payer: Self-pay

## 2022-08-29 DIAGNOSIS — E871 Hypo-osmolality and hyponatremia: Secondary | ICD-10-CM

## 2022-08-29 LAB — HEPATIC FUNCTION PANEL
ALT: 19 U/L (ref 0–35)
AST: 23 U/L (ref 0–37)
Albumin: 4.1 g/dL (ref 3.5–5.2)
Alkaline Phosphatase: 107 U/L (ref 39–117)
Bilirubin, Direct: 0 mg/dL (ref 0.0–0.3)
Total Bilirubin: 0.3 mg/dL (ref 0.2–1.2)
Total Protein: 6.9 g/dL (ref 6.0–8.3)

## 2022-08-29 LAB — CBC WITH DIFFERENTIAL/PLATELET
Basophils Absolute: 0.1 10*3/uL (ref 0.0–0.1)
Basophils Relative: 0.7 % (ref 0.0–3.0)
Eosinophils Absolute: 0.8 10*3/uL — ABNORMAL HIGH (ref 0.0–0.7)
Eosinophils Relative: 8.1 % — ABNORMAL HIGH (ref 0.0–5.0)
HCT: 36.6 % (ref 36.0–46.0)
Hemoglobin: 11.8 g/dL — ABNORMAL LOW (ref 12.0–15.0)
Lymphocytes Relative: 24.1 % (ref 12.0–46.0)
Lymphs Abs: 2.4 10*3/uL (ref 0.7–4.0)
MCHC: 32.2 g/dL (ref 30.0–36.0)
MCV: 82.2 fl (ref 78.0–100.0)
Monocytes Absolute: 0.9 10*3/uL (ref 0.1–1.0)
Monocytes Relative: 9.4 % (ref 3.0–12.0)
Neutro Abs: 5.7 10*3/uL (ref 1.4–7.7)
Neutrophils Relative %: 57.7 % (ref 43.0–77.0)
Platelets: 333 10*3/uL (ref 150.0–400.0)
RBC: 4.46 Mil/uL (ref 3.87–5.11)
RDW: 16.2 % — ABNORMAL HIGH (ref 11.5–15.5)
WBC: 9.8 10*3/uL (ref 4.0–10.5)

## 2022-08-29 LAB — LIPID PANEL
Cholesterol: 159 mg/dL (ref 0–200)
HDL: 60.6 mg/dL (ref >39.00)
LDL Cholesterol: 74 mg/dL (ref 0–99)
NonHDL: 98.33
Total CHOL/HDL Ratio: 3
Triglycerides: 121 mg/dL (ref 0.0–149.0)
VLDL: 24.2 mg/dL (ref 0.0–40.0)

## 2022-08-29 LAB — BASIC METABOLIC PANEL
BUN: 8 mg/dL (ref 6–23)
CO2: 28 mEq/L (ref 19–32)
Calcium: 9.2 mg/dL (ref 8.4–10.5)
Chloride: 89 mEq/L — ABNORMAL LOW (ref 96–112)
Creatinine, Ser: 0.45 mg/dL (ref 0.40–1.20)
GFR: 111.89 mL/min (ref >60.00)
Glucose, Bld: 103 mg/dL — ABNORMAL HIGH (ref 70–99)
Potassium: 5.3 mEq/L — ABNORMAL HIGH (ref 3.5–5.1)
Sodium: 123 mEq/L — ABNORMAL LOW (ref 135–145)

## 2022-08-29 LAB — TSH: TSH: 1.41 u[IU]/mL (ref 0.35–5.50)

## 2022-08-29 NOTE — Telephone Encounter (Signed)
I left the pt a detailed VM  stating results . Repeat BMP order is in place as well

## 2022-08-29 NOTE — Assessment & Plan Note (Signed)
Chronic problem.  Adequate control on Lisinopril 40mg  daily, Spironolactone 25mg  daily.  Currently asymptomatic.  Check labs due to ACE and diuretic use but no anticipated med changes.

## 2022-08-29 NOTE — Assessment & Plan Note (Signed)
Chronic problem.  Currently on Lipitor 40mg daily w/o difficulty.  Check labs.  Adjust meds prn  

## 2022-08-29 NOTE — Telephone Encounter (Signed)
-----   Message from Sheliah Hatch, MD sent at 08/29/2022  4:28 PM EDT ----- Your sodium is quite low.  I know in the past it was because you were drinking too much water.  Please let me know how much you are currently drinking and please don't drink any more than 50 oz.  This is also a known side effect of your Carbamzepine and this may need to be adjusted by your Provider based on these lab results.  If you develop dizziness, confusion, increased sleepiness/lethargy, nausea or vomiting- PLEASE go to the ER.  We need to repeat your sodium (BMP) first thing Monday at a lab only visit to make sure this is an accurate reading (dx hyponatremia).  If you can do this at your office, that would be fine,too.  I just want to make sure we've got accurate information.

## 2022-08-29 NOTE — Assessment & Plan Note (Signed)
Ongoing issue.  Has nerve conduction study upcoming.  Neuro recommended possible vascular w/u to r/o claudication or other abnormality as there has not yet been any identified cause of her sxs.  Referral placed.

## 2022-08-31 ENCOUNTER — Encounter: Payer: Self-pay | Admitting: Family Medicine

## 2022-09-01 ENCOUNTER — Telehealth: Payer: Self-pay

## 2022-09-01 ENCOUNTER — Other Ambulatory Visit (INDEPENDENT_AMBULATORY_CARE_PROVIDER_SITE_OTHER): Payer: Commercial Managed Care - PPO

## 2022-09-01 DIAGNOSIS — E871 Hypo-osmolality and hyponatremia: Secondary | ICD-10-CM | POA: Diagnosis not present

## 2022-09-01 LAB — BASIC METABOLIC PANEL
BUN: 15 mg/dL (ref 6–23)
CO2: 27 mEq/L (ref 19–32)
Calcium: 9.4 mg/dL (ref 8.4–10.5)
Chloride: 96 mEq/L (ref 96–112)
Creatinine, Ser: 0.64 mg/dL (ref 0.40–1.20)
GFR: 102.77 mL/min (ref >60.00)
Glucose, Bld: 235 mg/dL — ABNORMAL HIGH (ref 70–99)
Potassium: 4.8 mEq/L (ref 3.5–5.1)
Sodium: 135 mEq/L (ref 135–145)

## 2022-09-01 NOTE — Telephone Encounter (Signed)
Informed pt of lab results  

## 2022-09-01 NOTE — Telephone Encounter (Signed)
-----   Message from Sheliah Hatch, MD sent at 09/01/2022 12:09 PM EDT ----- Sodium is completely normal today- this is great news!  Potassium is also normal.  No changes at this time

## 2022-09-03 ENCOUNTER — Other Ambulatory Visit (HOSPITAL_COMMUNITY): Payer: Self-pay

## 2022-09-03 ENCOUNTER — Other Ambulatory Visit (HOSPITAL_BASED_OUTPATIENT_CLINIC_OR_DEPARTMENT_OTHER): Payer: Self-pay

## 2022-09-03 ENCOUNTER — Other Ambulatory Visit: Payer: Self-pay | Admitting: Cardiovascular Disease

## 2022-09-04 ENCOUNTER — Other Ambulatory Visit (HOSPITAL_BASED_OUTPATIENT_CLINIC_OR_DEPARTMENT_OTHER): Payer: Self-pay

## 2022-09-04 MED ORDER — SPIRONOLACTONE 25 MG PO TABS
25.0000 mg | ORAL_TABLET | Freq: Every day | ORAL | 1 refills | Status: DC
  Filled 2022-09-04: qty 90, 90d supply, fill #0
  Filled 2022-11-26: qty 90, 90d supply, fill #1

## 2022-09-08 ENCOUNTER — Other Ambulatory Visit (HOSPITAL_BASED_OUTPATIENT_CLINIC_OR_DEPARTMENT_OTHER): Payer: Self-pay

## 2022-09-12 ENCOUNTER — Other Ambulatory Visit (HOSPITAL_BASED_OUTPATIENT_CLINIC_OR_DEPARTMENT_OTHER): Payer: Self-pay

## 2022-09-12 MED ORDER — LAMOTRIGINE 200 MG PO TABS
200.0000 mg | ORAL_TABLET | Freq: Every day | ORAL | 1 refills | Status: DC
  Filled 2022-09-12: qty 90, 90d supply, fill #0
  Filled 2022-12-04: qty 90, 90d supply, fill #1

## 2022-09-12 MED ORDER — GABAPENTIN 600 MG PO TABS
600.0000 mg | ORAL_TABLET | Freq: Three times a day (TID) | ORAL | 2 refills | Status: DC
  Filled 2022-09-12: qty 90, 30d supply, fill #0

## 2022-09-15 ENCOUNTER — Other Ambulatory Visit (HOSPITAL_BASED_OUTPATIENT_CLINIC_OR_DEPARTMENT_OTHER): Payer: Self-pay

## 2022-09-16 ENCOUNTER — Other Ambulatory Visit: Payer: Self-pay | Admitting: *Deleted

## 2022-09-16 ENCOUNTER — Other Ambulatory Visit (HOSPITAL_BASED_OUTPATIENT_CLINIC_OR_DEPARTMENT_OTHER): Payer: Self-pay

## 2022-09-16 DIAGNOSIS — R252 Cramp and spasm: Secondary | ICD-10-CM

## 2022-09-19 ENCOUNTER — Encounter: Payer: Self-pay | Admitting: Internal Medicine

## 2022-09-19 ENCOUNTER — Ambulatory Visit: Payer: Commercial Managed Care - PPO | Admitting: Internal Medicine

## 2022-09-19 VITALS — BP 138/86 | HR 76 | Ht 68.0 in | Wt 290.4 lb

## 2022-09-19 DIAGNOSIS — Z7984 Long term (current) use of oral hypoglycemic drugs: Secondary | ICD-10-CM | POA: Diagnosis not present

## 2022-09-19 DIAGNOSIS — E119 Type 2 diabetes mellitus without complications: Secondary | ICD-10-CM | POA: Diagnosis not present

## 2022-09-19 DIAGNOSIS — E782 Mixed hyperlipidemia: Secondary | ICD-10-CM | POA: Diagnosis not present

## 2022-09-19 DIAGNOSIS — Z7985 Long-term (current) use of injectable non-insulin antidiabetic drugs: Secondary | ICD-10-CM

## 2022-09-19 LAB — POCT GLYCOSYLATED HEMOGLOBIN (HGB A1C): Hemoglobin A1C: 9.1 % — AB (ref 4.0–5.6)

## 2022-09-19 NOTE — Progress Notes (Signed)
Patient ID: [name], female   DOB: 04-16-1971, 52 y.o.   MRN: [id number]  HPI: [name] is a 52 y.o.-year-old female, presenting for f/u for DM2, dx in 2001, non-insulin-dependent, uncontrolled, with diabetic retinopathy. Last visit 6 months ago.  Interim history: No increased urination, blurry vision, nausea, chest pain.  She does have muscle cramps in her thighs and will have ABIs and an NCT performed next week. Since last visit, she had significant hyponatremia of 123 on 08/28/2022.  Sodium normalized 09/01/2022 despite a higher glucose of 235. She tells me that she stopped Trulicity few months ago and relaxed her diet.  She also did not check blood sugars.  When she restarted to check them, they were much higher.  She just restarted Trulicity. Her son is getting married in a week, in [place].  Reviewed HbA1c levels: Lab Results  Component Value Date   HGBA1C 8.1 (A) 02/27/2022   HGBA1C 8.4 (A) 10/01/2021   HGBA1C 9.7 (A) 06/13/2021   HGBA1C 6.4 (A) 12/11/2020   HGBA1C 6.6 (A) 08/16/2020   HGBA1C 8.1 (A) 12/15/2019   HGBA1C 7.6 (H) 07/12/2019   HGBA1C 6.7 (A) 11/30/2018   HGBA1C 8.5 (A) 07/26/2018   HGBA1C 9.9 (H) 04/15/2018   HGBA1C 7.6 08/03/2017   HGBA1C 6.6 (H) 12/30/2016   HGBA1C 6.1 (H) 10/09/2016   HGBA1C 9.6 02/21/2016   HGBA1C 9.2 10/05/2015   HGBA1C 10.4 05/31/2015   HGBA1C 8.8 01/09/2015   HGBA1C 11.0 (H) 06/21/2014   HGBA1C 8.8 (H) 01/03/2014   HGBA1C 9.6 (H) 04/12/2013   Pt was on a regimen of: - Metformin 2000 mg at dinnertime - Lantus 63 units at bedtime - Victoza 1.8 mg daily - Amaryl 4 mg 2x a day She had frequent yeast inf when sugars were higher before.   Pt is now on a regimen of: - Metformin 500 mg 2x a day >> 1000 mg with dinner >> 1000 mg 2x a day - Trulicity -initially had nausea, then resolved: 1.5 >> 3 mg weekly >> stopped it... >> just restarted it  She is checking sugars 0-1x a day >> just restarted to check: - am:   87-128 >> 120-130, occas. 150 - pizza >> 138-196, 216 >> 176-276 - 2h after b'fast: n/c >> 71-100 >> 83-99 >> n/c >> 105-161 >> 177, 240 - before lunch: n/c >> 90, 93 >> 76-102 >> 125-130s >> 96-130 >> 202-267 - 2h after lunch: n/c >> 84, 192 >> 80-94 >> 130 >> 111-157 >> 270 - before dinner: 120-150 >> n/c >> 95, 99 >> 85-111 >> n/c >> 192 >> 164, 202 - 2h after dinner: 83-101 >> 100-120 >> n/c  >> 142, 160 >> 276 - bedtime:  1 118-160 >> 91-133 >> 150s, occas. 200 >> n/c - nighttime: n/c Lowest sugar was 34 (after Sx - while still on insulin) >> ... 96 > 164; she has hypoglycemia awareness in the 90s. Highest sugar was 330 >> ... 225 x1 >> 216 >> 235 >> 276.  Glucometer: One Touch Ultra 2 >> FreeStyle Lite   -No CKD, last BUN/creatinine:  Lab Results  Component Value Date   BUN 15 09/01/2022   CREATININE 0.64 09/01/2022  On lisinopril 20.  -+ HL; last set of lipids: Lab Results  Component Value Date   CHOL 159 08/28/2022   HDL 60.60 08/28/2022   LDLCALC 74 08/28/2022   LDLDIRECT 112.0 06/21/2014   TRIG 121.0 08/28/2022   CHOLHDL 3 08/28/2022  On Lipitor  40.  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  She had cataracts but is status post cataract surgery.  Dr. Emily Filbert.  She sees a retina specialist.  -No numbness and tingling in her feet.  Last foot exam 10/01/2021.  She will have a NCT - 2/2 cramps in thighs. On Flexeril.  She had Roux-en-Y gastric bypass surgery in 03/2016.  After this, she lost more than 100 pounds but she gained more than 50 pounds back afterwards. She had a syncopal episode in [place] in 09/2020, after staying in the sun for 3 hours.  At that time, she went to the emergency room.  She was given IV fluids.  EKG was normal.  HCTZ was stopped and lisinopril was increased.  Next day, she tested positive for COVID.  This was complicated by bronchitis > had ABx. She had a previous syncopal episode in [place] in 03/2020.  ROS: + see  HPI  I reviewed pt's medications, allergies, PMH, social hx, family hx, and changes were documented in the history of present illness. Otherwise, unchanged from my initial visit note.  Past Medical History:  Diagnosis Date   Allergy    seasonal allergies   Anemia    hx of   Anxiety    on meds   Asthma    hx of   Bipolar disorder (HCC)    Cataract    bilateral sx   Chicken pox    Complication of anesthesia    oxygen saturation dropped after hysterectomy 2009 at [facility]   Depression    on meds   Diabetes mellitus    on meds   Dyspnea    GERD (gastroesophageal reflux disease)    hx of   Hyperlipidemia    on meds   Hypertension    readings   Hypothyroidism    hx of-LEFT thyroid removed   Irregular heartbeat    saw dr Myrtis Ser sees -cardiology as needed   Migraine    Sleep apnea    uses cpap   Past Surgical History:  Procedure Laterality Date   ANKLE SURGERY Left 1989   BACK SURGERY  1999   CATARACT EXTRACTION, BILATERAL     CESAREAN SECTION     1995   LAPAROSCOPIC ASSISTED VAGINAL HYSTERECTOMY  2009     BSO fibroids, DUB, pelvic pain   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 REPAIR N/A 04/14/2016   Procedure: LAPAROSCOPIC ROUX-EN-Y GASTRIC BYPASS  WITH UPPER ENDOSCOPY;  Surgeon: Glenna Fellows, MD;  Location: [facility];  Service: General;  Laterality: N/A;   SHOULDER ARTHROSCOPY WITH OPEN ROTATOR CUFF REPAIR Right 01/02/2017   Procedure: Right shoulder arthroscopy, A-subcromial decompression, mini open rotator cuff repair, open distal clavicle resection, biceps tenodesis;  Surgeon: Beverely Low, MD;  Location: [facility] OR;  Service: Orthopedics;  Laterality: Right;   THYROIDECTOMY, PARTIAL Left 2001   UMBILICAL HERNIA REPAIR  2003   WISDOM TOOTH EXTRACTION     Social History   Social History   Marital Status: Married    Spouse Name: N/A   Number of Children: 1   Occupational History   Passenger transport manager, Administrator   Social History Main Topics    Smoking status: Never Smoker    Smokeless tobacco: Never Used   Alcohol Use: No   Drug Use: No   Current Outpatient Medications on File Prior to Visit  Medication Sig Dispense Refill   ARIPiprazole (ABILIFY) 5 MG tablet Take 1 tablet (5 mg  total) by mouth daily. 30 tablet 2   ARIPiprazole (ABILIFY) 5 MG tablet Take 1 tablet (5 mg total) by mouth daily. 30 tablet 2   ASPIRIN PO Take 81 mg by mouth 2 (two) times daily.     atorvastatin (LIPITOR) 40 MG tablet Take 1 tablet (40 mg total) by mouth daily. 90 tablet 1   Biotin 16109 MCG TABS Take 1 tablet by mouth daily.     Blood Glucose Monitoring Suppl (FREESTYLE LITE) w/Device KIT Use to check blood sugar 2 times daily 1 kit 0   carbamazepine (TEGRETOL XR) 200 MG 12 hr tablet 1 tablet in the morning, 2 at night     carbamazepine (TEGRETOL-XR) 200 MG 12 hr tablet Take 1 tablet (200 mg total) by mouth in the morning and then take 2 tablets (400 mg total) by mouth at bedtime. 270 tablet 1   clobetasol ointment (TEMOVATE) 0.05 % APPLY 1 APPLICATION ON TO THE SKIN 2 TIMES DAILY. DO NOT USE FOR MORE THAN 7 DAYS 60 g 1   cyclobenzaprine (FLEXERIL) 10 MG tablet Take 1 tablet (10 mg total) by mouth at bedtime. 30 tablet 3   Dulaglutide (TRULICITY) 3 MG/0.5ML SOPN Inject 3 mg into the skin once a week. 6 mL 3   gabapentin (NEURONTIN) 600 MG tablet Take 1 tablet (600 mg total) by mouth in the morning, at noon, and at bedtime. 270 tablet 0   gabapentin (NEURONTIN) 600 MG tablet Take 1 tablet (600 mg total) by mouth 3 (three) times daily. 90 tablet 2   Glucosamine HCl (GLUCOSAMINE PO) Take 3 tablets by mouth daily.     glucose blood (FREESTYLE LITE) test strip Use to check blood sugar 2 times daily 100 each 0   lamoTRIgine (LAMICTAL) 200 MG tablet Take 1 tablet (200 mg total) by mouth at bedtime. 90 tablet 1   Lancets (FREESTYLE) lancets Use as instructed to check sugar 2 times daily 200 each 5   lisinopril (ZESTRIL) 40 MG tablet Take 1 tablet (40 mg total)  by mouth daily. 90 tablet 1   Loratadine (CLARITIN PO) Take by mouth daily.     metFORMIN (GLUCOPHAGE) 1000 MG tablet Take 1 tablet (1,000 mg total) by mouth 2 (two) times daily with a meal. 180 tablet 3   Multiple Vitamin (MULTIVITAMIN) tablet Take 1 tablet by mouth daily.     rOPINIRole (REQUIP) 0.5 MG tablet Take 1/2 tablet by mouth 1-2 hours before bedtime for one week, then increase to 1 tablet by mouth 1-2 hours before bedtime. (Patient not taking: Reported on 08/28/2022) 30 tablet 0   spironolactone (ALDACTONE) 25 MG tablet Take 1 tablet (25 mg total) by mouth daily. 90 tablet 1   TEGRETOL-XR 200 MG 12 hr tablet Take 1 tablet by mouth every morning and take 2 tablets at bedtime 270 tablet 0   tiaGABine (GABITRIL) 4 MG tablet Take 8 mg by mouth 2 (two) times daily.     tiaGABine (GABITRIL) 4 MG tablet Take 1 tablet (4 mg total) by mouth in the morning AND 2 tablets (8 mg total) at bedtime. 270 tablet 0   tiaGABine (GABITRIL) 4 MG tablet Take 1 tablet (4 mg total) by mouth in the morning AND 2 tablets (8 mg total) at bedtime. 270 tablet 0   Turmeric (QC TUMERIC COMPLEX PO) Take by mouth daily.     Current Facility-Administered Medications on File Prior to Visit  Medication Dose Route Frequency Provider Last Rate Last Admin   0.9 %  sodium chloride infusion  500 mL Intravenous Once Nandigam, Eleonore Chiquito, MD       Allergies  Allergen Reactions   Zofran [Ondansetron] Swelling   Zofran [Ondansetron Hcl] Other (See Comments)    Severe headache   Family History  Problem Relation Age of Onset   Hyperlipidemia Mother    Diabetes Mother    Anxiety disorder Mother    Heart disease Mother    Hypertension Father    Lupus Father    Heart disease Father    Asthma Father    Colon polyps Father 64   Stroke Maternal Aunt    Seizures Paternal Grandfather    Stroke Paternal Grandfather    Mental illness Paternal Grandfather    Breast cancer Neg Hx    Colon cancer Neg Hx    Esophageal cancer Neg  Hx    Stomach cancer Neg Hx    Rectal cancer Neg Hx    PE: BP 138/86 (BP Location: Right Arm, Patient Position: Sitting, Cuff Size: Normal)   Pulse 76   Ht 5\' 8"  (1.727 m)   Wt 290 lb 6.4 oz (131.7 kg)   LMP 01/25/2008   SpO2 98%   BMI 44.16 kg/m  Wt Readings from Last 3 Encounters:  09/19/22 290 lb 6.4 oz (131.7 kg)  08/28/22 299 lb (135.6 kg)  07/23/22 296 lb (134.3 kg)   Constitutional: overweight, in NAD Eyes:  EOMI, no exophthalmos ENT: no neck masses, no cervical lymphadenopathy Cardiovascular: RRR, No MRG Respiratory: CTA B Musculoskeletal: no deformities Skin:no rashes Neurological: no tremor with outstretched hands Diabetic Foot Exam - Simple   Simple Foot Form Diabetic Foot exam was performed with the following findings: Yes 09/19/2022  4:14 PM  Visual Inspection No deformities, no ulcerations, no other skin breakdown bilaterally: Yes Sensation Testing See comments: Yes Pulse Check Posterior Tibialis and Dorsalis pulse intact bilaterally: Yes Comments + B pitting edema Slight decreased sensation to monofilament in B toes    ASSESSMENT: 1. DM2, insulin-independent, uncontrolled, with complications - DR  2. Obesity class III  3. HL  PLAN:  1. Patient with longstanding, previously uncontrolled type 2 diabetes, on a complex medication regimen in the past but with improvement in control after her gastric bypass surgery in 2017 and losing more than 100 pounds.  She then gained some of the weight back, currently still 60 pounds lower than prior to bypass. -At last visit, HbA1c was lower, at 8.1%, and at that time she was planning to start walking with her husband and to improve her diet.  We increased her Trulicity dose.  However, since then, she came off Trulicity and relaxed her diet.  Moreover, she stopped checking blood sugars as she ran out of the strips.  When she restarted to check a week ago, sugars were much higher.  She restarted Trulicity.  Sugars  continue to be elevated, but I expect them to improve now on Trulicity.  I did advise her to let me know if they do not.  In that case, we will need to add back basal insulin.  I also strongly advised him to check sugars consistently. - I advised her to:  Patient Instructions  Please continue: - Metformin 1000 mg 2x a day - Trulicity 3 mg weekly  Check sugars consistently.  Let me know if sugars do not improve in 2-3 weeks.  Please come back for a follow-up appointment in 3-4 months.  - we checked her HbA1c: 9.1% (higher)  - advised to  check sugars at different times of the day - 1x a day, rotating check times - advised for yearly eye exams >> she is UTD - return to clinic in 4 months    2. Obesity class III -She has a history of Roux-en-Y gastric bypass surgery, after which she lost a significant amount of weight but she started to gain weight back afterwards.   -At last visit we increased Trulicity, to further help with weight loss along with diabetes control -She lost 4 pounds before last visit, and weight is not stable since last visit   3. HL -Reviewed lipid panel from 08/2022: LDL slightly above our target of less than 70, otherwise fractions at goal: Lab Results  Component Value Date   CHOL 159 08/28/2022   HDL 60.60 08/28/2022   LDLCALC 74 08/28/2022   LDLDIRECT 112.0 06/21/2014   TRIG 121.0 08/28/2022   CHOLHDL 3 08/28/2022  -She continues on Lipitor 40 mg daily.  She has muscle cramps, but she mentions that she took Lipitor for 8 years before the muscle cramps started.  Carlus Pavlov, MD PhD [facility]

## 2022-09-19 NOTE — Patient Instructions (Signed)
Please continue: - Metformin 1000 mg 2x a day - Trulicity 3 mg weekly  Check sugars consistently.  Let me know if sugars do not improve in 2-3 weeks.  Please come back for a follow-up appointment in 3-4 months.

## 2022-09-23 ENCOUNTER — Other Ambulatory Visit (HOSPITAL_BASED_OUTPATIENT_CLINIC_OR_DEPARTMENT_OTHER): Payer: Self-pay

## 2022-09-23 ENCOUNTER — Encounter: Payer: Commercial Managed Care - PPO | Admitting: Neurology

## 2022-09-23 MED ORDER — GABAPENTIN 600 MG PO TABS
600.0000 mg | ORAL_TABLET | Freq: Three times a day (TID) | ORAL | 2 refills | Status: DC
  Filled 2022-09-23 – 2022-10-06 (×5): qty 90, 30d supply, fill #0
  Filled 2022-11-04: qty 90, 30d supply, fill #1
  Filled 2022-12-04: qty 90, 30d supply, fill #2

## 2022-09-23 MED ORDER — CARBAMAZEPINE ER 200 MG PO TB12
600.0000 mg | ORAL_TABLET | Freq: Every day | ORAL | 0 refills | Status: DC
  Filled 2022-09-23: qty 270, 90d supply, fill #0

## 2022-09-24 DIAGNOSIS — F3181 Bipolar II disorder: Secondary | ICD-10-CM | POA: Diagnosis not present

## 2022-09-25 ENCOUNTER — Other Ambulatory Visit: Payer: Self-pay | Admitting: Internal Medicine

## 2022-09-25 ENCOUNTER — Other Ambulatory Visit (HOSPITAL_BASED_OUTPATIENT_CLINIC_OR_DEPARTMENT_OTHER): Payer: Self-pay

## 2022-09-25 MED ORDER — FREESTYLE LITE TEST VI STRP
ORAL_STRIP | 3 refills | Status: DC
  Filled 2022-09-25: qty 100, 50d supply, fill #0

## 2022-09-26 ENCOUNTER — Ambulatory Visit (HOSPITAL_COMMUNITY)
Admission: RE | Admit: 2022-09-26 | Discharge: 2022-09-26 | Disposition: A | Discharge status: Home / Self Care | Payer: Commercial Managed Care - PPO | Source: Ambulatory Visit | Attending: Vascular Surgery | Admitting: Vascular Surgery

## 2022-09-26 ENCOUNTER — Ambulatory Visit: Payer: Commercial Managed Care - PPO | Admitting: Vascular Surgery

## 2022-09-26 ENCOUNTER — Encounter: Payer: Self-pay | Admitting: Vascular Surgery

## 2022-09-26 ENCOUNTER — Other Ambulatory Visit (HOSPITAL_BASED_OUTPATIENT_CLINIC_OR_DEPARTMENT_OTHER): Payer: Self-pay

## 2022-09-26 VITALS — BP 135/82 | HR 68 | Temp 97.9°F | Resp 20 | Ht 68.0 in | Wt 290.0 lb

## 2022-09-26 DIAGNOSIS — R252 Cramp and spasm: Secondary | ICD-10-CM | POA: Diagnosis not present

## 2022-09-26 LAB — VAS US ABI WITH/WO TBI
Left ABI: 1.17
Right ABI: 1.13

## 2022-09-26 NOTE — Progress Notes (Signed)
Office Note     CC: Lateral lower extremity burning and tingling appreciated overnight Requesting Provider:  Sheliah Hatch, MD  HPI: [name] is a 52 y.o. ([birth year]) female presenting at the request of .Sheliah Hatch, MD due to concern for arterial insufficiency with bilateral numbness and tingling appreciated overnight.  Exam, [name] was doing well.  A native of [place], she graduated from [employer].  She works at [employer].  [name] is appreciated bilateral numbness and tingling at night for several months.  This occurs from the knee down, and has affected her sleep pattern dramatically.  There is a burning sensation associated.  No significant swelling.  Accompanying the symptoms as upper thigh cramping.  [name] has a history of type 2 diabetes which has been present for roughly 20 years.  She has been on 800 mg of gabapentin 3 times daily for bipolar disorder, which is managed.  Denies symptoms of claudication, ischemic rest pain, tissue loss   Past Medical History:  Diagnosis Date   Allergy    seasonal allergies   Anemia    hx of   Anxiety    on meds   Asthma    hx of   Bipolar disorder (HCC)    Cataract    bilateral sx   Chicken pox    Complication of anesthesia    oxygen saturation dropped after hysterectomy 2009 at [facility]   Depression    on meds   Diabetes mellitus    on meds   Dyspnea    GERD (gastroesophageal reflux disease)    hx of   Hyperlipidemia    on meds   Hypertension    readings   Hypothyroidism    hx of-LEFT thyroid removed   Irregular heartbeat    saw dr Myrtis Ser sees -cardiology as needed   Migraine    Sleep apnea    uses cpap    Past Surgical History:  Procedure Laterality Date   ANKLE SURGERY Left 1989   BACK SURGERY  1999   CATARACT EXTRACTION, BILATERAL     CESAREAN SECTION     1995   LAPAROSCOPIC ASSISTED VAGINAL HYSTERECTOMY  2009     BSO fibroids, DUB, pelvic pain   LAPAROSCOPIC ROUX-EN-Y  GASTRIC BYPASS WITH HIATAL HERNIA REPAIR N/A 04/14/2016   Procedure: LAPAROSCOPIC ROUX-EN-Y GASTRIC BYPASS  WITH UPPER ENDOSCOPY;  Surgeon: Glenna Fellows, MD;  Location: [facility];  Service: General;  Laterality: N/A;   SHOULDER ARTHROSCOPY WITH OPEN ROTATOR CUFF REPAIR Right 01/02/2017   Procedure: Right shoulder arthroscopy, A-subcromial decompression, mini open rotator cuff repair, open distal clavicle resection, biceps tenodesis;  Surgeon: Beverely Low, MD;  Location: [facility] OR;  Service: Orthopedics;  Laterality: Right;   THYROIDECTOMY, PARTIAL Left 2001   UMBILICAL HERNIA REPAIR  2003   WISDOM TOOTH EXTRACTION      Social History   Socioeconomic History   Marital status: Married    Spouse name: Not on file   Number of children: 1   Years of education: Not on file   Highest education level: Not on file  Occupational History   Occupation: nursing  Tobacco Use   Smoking status: Never   Smokeless tobacco: Never  Vaping Use   Vaping Use: Never used  Substance and Sexual Activity   Alcohol use: No    Alcohol/week: 0.0 standard drinks of alcohol   Drug use: No   Sexual activity: Yes    Partners: Male  Other Topics Concern  Not on file  Social History Narrative   Not on file   Social Determinants of Health   Financial Resource Strain: Not on file  Food Insecurity: Not on file  Transportation Needs: Not on file  Physical Activity: Not on file  Stress: Not on file  Social Connections: Not on file  Intimate Partner Violence: Not on file   Family History  Problem Relation Age of Onset   Hyperlipidemia Mother    Diabetes Mother    Anxiety disorder Mother    Heart disease Mother    Hypertension Father    Lupus Father    Heart disease Father    Asthma Father    Colon polyps Father 33   Stroke Maternal Aunt    Seizures Paternal Grandfather    Stroke Paternal Grandfather    Mental illness Paternal Grandfather    Breast cancer Neg Hx    Colon cancer Neg Hx     Esophageal cancer Neg Hx    Stomach cancer Neg Hx    Rectal cancer Neg Hx     Current Outpatient Medications  Medication Sig Dispense Refill   ARIPiprazole (ABILIFY) 5 MG tablet Take 1 tablet (5 mg total) by mouth daily. 30 tablet 2   ASPIRIN PO Take 81 mg by mouth 2 (two) times daily.     atorvastatin (LIPITOR) 40 MG tablet Take 1 tablet (40 mg total) by mouth daily. 90 tablet 1   Biotin 40981 MCG TABS Take 1 tablet by mouth daily.     Blood Glucose Monitoring Suppl (FREESTYLE LITE) w/Device KIT Use to check blood sugar 2 times daily 1 kit 0   carbamazepine (TEGRETOL-XR) 200 MG 12 hr tablet Take 1 tablet (200 mg total) by mouth in the morning and then take 2 tablets (400 mg total) by mouth at bedtime. 270 tablet 0   clobetasol ointment (TEMOVATE) 0.05 % APPLY 1 APPLICATION ON TO THE SKIN 2 TIMES DAILY. DO NOT USE FOR MORE THAN 7 DAYS 60 g 1   cyclobenzaprine (FLEXERIL) 10 MG tablet Take 1 tablet (10 mg total) by mouth at bedtime. 30 tablet 3   Dulaglutide (TRULICITY) 3 MG/0.5ML SOPN Inject 3 mg into the skin once a week. 6 mL 3   gabapentin (NEURONTIN) 600 MG tablet Take 1 tablet (600 mg total) by mouth in the morning, at noon, and at bedtime. 270 tablet 0   gabapentin (NEURONTIN) 600 MG tablet Take 1 tablet (600 mg total) by mouth 3 (three) times daily. 90 tablet 2   Glucosamine HCl (GLUCOSAMINE PO) Take 3 tablets by mouth daily.     glucose blood (FREESTYLE LITE) test strip Use to check blood sugar 2 times daily 100 each 3   lamoTRIgine (LAMICTAL) 200 MG tablet Take 1 tablet (200 mg total) by mouth at bedtime. 90 tablet 1   Lancets (FREESTYLE) lancets Use as instructed to check sugar 2 times daily 200 each 5   lisinopril (ZESTRIL) 40 MG tablet Take 1 tablet (40 mg total) by mouth daily. 90 tablet 1   Loratadine (CLARITIN PO) Take by mouth daily.     metFORMIN (GLUCOPHAGE) 1000 MG tablet Take 1 tablet (1,000 mg total) by mouth 2 (two) times daily with a meal. 180 tablet 3   Multiple  Vitamin (MULTIVITAMIN) tablet Take 1 tablet by mouth daily.     spironolactone (ALDACTONE) 25 MG tablet Take 1 tablet (25 mg total) by mouth daily. 90 tablet 1   TEGRETOL-XR 200 MG 12 hr tablet Take 1  tablet by mouth every morning and take 2 tablets at bedtime 270 tablet 0   tiaGABine (GABITRIL) 4 MG tablet Take 8 mg by mouth 2 (two) times daily.     tiaGABine (GABITRIL) 4 MG tablet Take 1 tablet (4 mg total) by mouth in the morning AND 2 tablets (8 mg total) at bedtime. 270 tablet 0   tiaGABine (GABITRIL) 4 MG tablet Take 1 tablet (4 mg total) by mouth in the morning AND 2 tablets (8 mg total) at bedtime. 270 tablet 0   Turmeric (QC TUMERIC COMPLEX PO) Take by mouth daily.     rOPINIRole (REQUIP) 0.5 MG tablet Take 1/2 tablet by mouth 1-2 hours before bedtime for one week, then increase to 1 tablet by mouth 1-2 hours before bedtime. (Patient not taking: Reported on 08/28/2022) 30 tablet 0   Current Facility-Administered Medications  Medication Dose Route Frequency Provider Last Rate Last Admin   0.9 %  sodium chloride infusion  500 mL Intravenous Once Nandigam, Eleonore Chiquito, MD        Allergies  Allergen Reactions   Zofran [Ondansetron] Swelling   Zofran [Ondansetron Hcl] Other (See Comments)    Severe headache     REVIEW OF SYSTEMS:  [X]  denotes positive finding, [ ]  denotes negative finding Cardiac  Comments:  Chest pain or chest pressure:    Shortness of breath upon exertion:    Short of breath when lying flat:    Irregular heart rhythm:        Vascular    Pain in calf, thigh, or hip brought on by ambulation:    Pain in feet at night that wakes you up from your sleep:     Blood clot in your veins:    Leg swelling:         Pulmonary    Oxygen at home:    Productive cough:     Wheezing:         Neurologic    Sudden weakness in arms or legs:     Sudden numbness in arms or legs:     Sudden onset of difficulty speaking or slurred speech:    Temporary loss of vision in one eye:      Problems with dizziness:         Gastrointestinal    Blood in stool:     Vomited blood:         Genitourinary    Burning when urinating:     Blood in urine:        Psychiatric    Major depression:         Hematologic    Bleeding problems:    Problems with blood clotting too easily:        Skin    Rashes or ulcers:        Constitutional    Fever or chills:      PHYSICAL EXAMINATION:  Vitals:   09/26/22 1041  BP: 135/82  Pulse: 68  Resp: 20  Temp: 97.9 F (36.6 C)  SpO2: 96%  Weight: 290 lb (131.5 kg)  Height: 5\' 8"  (1.727 m)    General:  WDWN in NAD; vital signs documented above Gait: Not observed HENT: WNL, normocephalic Pulmonary: normal non-labored breathing , without wheezing Cardiac: regular HR Abdomen: soft, NT, no masses Skin: without rashes Vascular Exam/Pulses:  Right Left  Radial 2+ (normal) 2+ (normal)  Ulnar    Femoral    Popliteal    DP 2+ (normal) 2+ (normal)  PT     Extremities: without ischemic changes, without Gangrene , without cellulitis; without open wounds;  Musculoskeletal: no muscle wasting or atrophy  Neurologic: A&O X 3;  No focal weakness or paresthesias are detected Psychiatric:  The pt has Normal affect.   Non-Invasive Vascular Imaging:   Normal ABI    ASSESSMENT/PLAN: Carmelle Makailyn Fedele is a 52 y.o. female presenting with symptoms consistent with neuropathy.  ABI was reviewed, and found to be normal.   I had a long conversation with Evalene regarding the above.  My guess is that her baseline neuropathy has been covered by 800 3 times daily of gabapentin which she has taken for over 10 years.  I do not have a vascular etiology for her symptoms.  I told Tonisha I am happy to see her again should any questions or concerns arise.  We discussed the importance of footcare, especially in the setting of longstanding diabetes.  I asked her to call my office immediately should new wounds arise that do not heal within a month's  time.    Victorino Sparrow, MD [facility] [phone]

## 2022-09-29 ENCOUNTER — Ambulatory Visit: Payer: Commercial Managed Care - PPO | Admitting: Neurology

## 2022-09-29 ENCOUNTER — Other Ambulatory Visit: Payer: Self-pay | Admitting: Family Medicine

## 2022-09-29 ENCOUNTER — Other Ambulatory Visit (HOSPITAL_BASED_OUTPATIENT_CLINIC_OR_DEPARTMENT_OTHER): Payer: Self-pay

## 2022-09-29 DIAGNOSIS — G629 Polyneuropathy, unspecified: Secondary | ICD-10-CM

## 2022-09-29 DIAGNOSIS — R202 Paresthesia of skin: Secondary | ICD-10-CM

## 2022-09-29 MED ORDER — ATORVASTATIN CALCIUM 40 MG PO TABS
40.0000 mg | ORAL_TABLET | Freq: Every day | ORAL | 1 refills | Status: DC
  Filled 2022-09-29: qty 90, 90d supply, fill #0
  Filled 2022-12-24: qty 90, 90d supply, fill #1

## 2022-09-29 NOTE — Procedures (Addendum)
[facility]  [address], [address]  [place], [place] [address] Tel: [phone] Fax: [phone] Test Date:  09/29/2022  Patient: [name] DOB: 09/25/1970 Physician: Jacquelyne Balint, MD  Sex: Female Height: 5\' 8"  Ref Phys: Shon Millet, DO  ID#: [id number]   Technician:    History: This is a 52 year old female with bilateral leg pain.  NCV & EMG Findings: Extensive electrodiagnostic evaluation of the right and left lower limbs shows: Bilateral sural sensory responses are borderline normal. Bilateral superficial peroneal/fibular sensory responses are absent. Bilateral peroneal/fibular (EDB) motor responses show reduced amplitudes (left 0.98, right 2.1 mV). Right peroneal/fibular (TA) motor response shows reduced amplitude (2.8 mV). Left tibial (AH) motor response shows reduced amplitude (2.9 mV). Right tibial (AH) motor response is within normal limits. Bilateral H reflex is absent. Active denervation changes are seen in intrinsic foot muscles of bilateral lower limbs (bilateral extensor digitorum brevis and left abductor hallucis). Chronic motor axon loss changes without active denervation changes are seen in the left flexor digitorum brevis muscle.  Impression: This is a complex study that may be limited by technical factors arising from patient physiognomy. 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 No electrodiagnostic evidence of a right or left lumbosacral (L3-S1) motor radiculopathy.    ___________________________ Jacquelyne Balint, MD    Nerve Conduction Studies Motor Nerve Results    Latency Amplitude F-Lat Segment Distance CV Comment  Site (ms) Norm (mV) Norm (ms)  (cm) (m/s) Norm   Left Fibular (EDB) Motor  Ankle 4.5  < 6.0 *0.98  > 2.5        Bel fib head 12.7 - 0.56 -  Bel fib head-Ankle 33 40  > 40   Pop fossa 14.9 - 0.53 -  Pop  fossa-Bel fib head 9 41 -   Right Fibular (EDB) Motor  Ankle 3.0  < 6.0 *2.1  > 2.5        Bel fib head 11.3 - 1.36 -  Bel fib head-Ankle 34 41  > 40   Pop fossa 13.1 - 1.34 -  Pop fossa-Bel fib head 7.5 42 -   Right Fibular (TA) Motor  Fib head 2.1  < 4.5 *2.8  > 3.0        Pop fossa 3.6  < 6.7 2.6 -  Pop fossa-Fib head 8 53  > 40   Left Tibial (AH) Motor  Ankle 4.3  < 6.0 *2.9  > 4.0        Knee 14.4 - 1.66 -  Knee-Ankle 41 41  > 40   Right Tibial (AH) Motor  Ankle 4.4  < 6.0 5.6  > 4.0        Knee 13.4 - 3.5 -  Knee-Ankle 42 47  > 40    Sensory Sites    Neg Peak Lat Amplitude (O-P) Segment Distance Velocity Comment  Site (ms) Norm (V) Norm  (cm) (ms)   Left Superficial Fibular Sensory  14 cm-Ankle *NR  < 4.6 *NR  > 4 14 cm-Ankle 14    Right Superficial Fibular Sensory  14 cm-Ankle *NR  < 4.6 *NR  > 4 14 cm-Ankle 14    Left Sural Sensory  Calf-Lat mall 3.5  < 4.6 4  > 4 Calf-Lat mall 14    Right Sural Sensory  Calf-Lat mall 4.1  < 4.6 4  > 4 Calf-Lat mall  14     H-Reflex Results    M-Lat H Lat H Neg Amp H-M Lat  Site (ms) (ms) Norm (mV) (ms)  Left Tibial H-Reflex  Pop fossa 5.7 -  < 35.0 - -  Right Tibial H-Reflex  Pop fossa 6.0 -  < 35.0 - -   Electromyography   Side Muscle Ins.Act Fibs Fasc Recrt Amp Dur Poly Activation Comment  Left Tib ant Nml Nml Nml Nml Nml Nml Nml Nml N/A  Left Gastroc MH Nml Nml Nml Nml Nml Nml Nml Nml N/A  Left FDL Nml Nml Nml *2- *1+ *1+ *1+ Nml N/A  Left Vastus lat Nml Nml Nml Nml Nml Nml Nml Nml N/A  Left EDB Nml *2+ Nml *N.E. *- *- *- *N.E. N/A  Left AH Nml *1+ Nml *N.E. *- *- *- *N.E. N/A  Right Tib ant Nml Nml Nml Nml Nml Nml Nml Nml N/A  Right Gastroc MH Nml Nml Nml Nml Nml Nml Nml Nml N/A  Right FDL Nml Nml Nml Nml Nml Nml Nml Nml N/A  Right EDB Nml *1+ Nml *N.E. *- *- *- *N.E. N/A  Right Vastus lat Nml Nml Nml Nml Nml Nml Nml Nml N/A      Waveforms:  Motor             Sensory           H-Reflex

## 2022-09-30 ENCOUNTER — Other Ambulatory Visit: Payer: Self-pay | Admitting: Cardiovascular Disease

## 2022-09-30 ENCOUNTER — Other Ambulatory Visit (HOSPITAL_BASED_OUTPATIENT_CLINIC_OR_DEPARTMENT_OTHER): Payer: Self-pay

## 2022-09-30 MED ORDER — LISINOPRIL 40 MG PO TABS
40.0000 mg | ORAL_TABLET | Freq: Every day | ORAL | 0 refills | Status: DC
  Filled 2022-09-30: qty 90, 90d supply, fill #0

## 2022-10-02 ENCOUNTER — Encounter: Payer: Self-pay | Admitting: Neurology

## 2022-10-02 ENCOUNTER — Other Ambulatory Visit (HOSPITAL_BASED_OUTPATIENT_CLINIC_OR_DEPARTMENT_OTHER): Payer: Self-pay

## 2022-10-02 NOTE — Progress Notes (Signed)
Patient advised of D.Jaffe note. Patient has an appointment with her Psychiatrist next week and will ask her and have her fax over her notes.    Patient also wanted to know if this is the reason why she having the cramps in her thighs?

## 2022-10-06 ENCOUNTER — Other Ambulatory Visit (HOSPITAL_BASED_OUTPATIENT_CLINIC_OR_DEPARTMENT_OTHER): Payer: Self-pay

## 2022-10-06 ENCOUNTER — Other Ambulatory Visit: Payer: Self-pay

## 2022-10-08 ENCOUNTER — Other Ambulatory Visit: Payer: Self-pay

## 2022-10-27 ENCOUNTER — Encounter: Payer: Self-pay | Admitting: Neurology

## 2022-11-04 ENCOUNTER — Other Ambulatory Visit (HOSPITAL_BASED_OUTPATIENT_CLINIC_OR_DEPARTMENT_OTHER): Payer: Self-pay

## 2022-11-04 ENCOUNTER — Other Ambulatory Visit: Payer: Self-pay

## 2022-11-06 ENCOUNTER — Encounter (HOSPITAL_COMMUNITY): Payer: Self-pay | Admitting: *Deleted

## 2022-11-07 ENCOUNTER — Other Ambulatory Visit (HOSPITAL_BASED_OUTPATIENT_CLINIC_OR_DEPARTMENT_OTHER): Payer: Self-pay

## 2022-11-10 ENCOUNTER — Other Ambulatory Visit: Payer: Self-pay | Admitting: Family Medicine

## 2022-11-10 ENCOUNTER — Other Ambulatory Visit (HOSPITAL_BASED_OUTPATIENT_CLINIC_OR_DEPARTMENT_OTHER): Payer: Self-pay

## 2022-11-10 MED ORDER — CYCLOBENZAPRINE HCL 10 MG PO TABS
10.0000 mg | ORAL_TABLET | Freq: Every day | ORAL | 3 refills | Status: DC
  Filled 2022-11-10: qty 30, 30d supply, fill #0
  Filled 2022-12-04: qty 30, 30d supply, fill #1
  Filled 2023-01-05: qty 30, 30d supply, fill #2
  Filled 2023-02-02: qty 30, 30d supply, fill #3

## 2022-11-19 DIAGNOSIS — F3181 Bipolar II disorder: Secondary | ICD-10-CM | POA: Diagnosis not present

## 2022-11-21 NOTE — Progress Notes (Signed)
Cardiology Office Note:   Date:  12/02/2022  NAME:  [name]    MRN: [id number] DOB:  [birth year]   PCP:  Sheliah Hatch, MD  Cardiologist:  Reatha Harps, MD  Electrophysiologist:  None   Referring MD: Sheliah Hatch, MD   Chief Complaint  Patient presents with   Follow-up         History of Present Illness:   [name] is a 52 y.o. female with a hx of diabetes, hypertension, hyperlipidemia who presents for follow-up.  Blood pressure a bit elevated today but she rushed to her appointment.  She tells me her values range between 130-140 at home.  She reports no chest pain or trouble breathing.  She is starting to exercise.  Walking 1.25 miles every other day.  No limitations.  Cholesterol levels close enough to goal.  She is on aspirin.  We discussed no strong indication to continue this.  Diabetes not at goal.  She is working on this.  She is losing weight.  Overall seems to be doing well.  Reports no palpitations.  Overall stable.  Problem List 1. Diabetes -A1c 9.1 2. HLD -T chol 159, HDL 60, LDL 74, TG 121 3. BMI 41 -s/p gastric bypass 2017 4. OSA 5. HTN  Past Medical History: Past Medical History:  Diagnosis Date   Allergy    seasonal allergies   Anemia    hx of   Anxiety    on meds   Asthma    hx of   Bipolar disorder (HCC)    Cataract    bilateral sx   Chicken pox    Complication of anesthesia    oxygen saturation dropped after hysterectomy 2009 at [facility]   Depression    on meds   Diabetes mellitus    on meds   Dyspnea    GERD (gastroesophageal reflux disease)    hx of   Hyperlipidemia    on meds   Hypertension    readings   Hypothyroidism    hx of-LEFT thyroid removed   Irregular heartbeat    saw dr Myrtis Ser sees -cardiology as needed   Migraine    Sleep apnea    uses cpap    Past Surgical History: Past Surgical History:  Procedure Laterality Date   ANKLE SURGERY Left 1989   BACK SURGERY  1999   CATARACT EXTRACTION,  BILATERAL     CESAREAN SECTION     1995   LAPAROSCOPIC ASSISTED VAGINAL HYSTERECTOMY  2009     BSO fibroids, DUB, pelvic pain   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 REPAIR N/A 04/14/2016   Procedure: LAPAROSCOPIC ROUX-EN-Y GASTRIC BYPASS  WITH UPPER ENDOSCOPY;  Surgeon: Glenna Fellows, MD;  Location: [facility] ORS;  Service: General;  Laterality: N/A;   SHOULDER ARTHROSCOPY WITH OPEN ROTATOR CUFF REPAIR Right 01/02/2017   Procedure: Right shoulder arthroscopy, A-subcromial decompression, mini open rotator cuff repair, open distal clavicle resection, biceps tenodesis;  Surgeon: Beverely Low, MD;  Location: [facility] OR;  Service: Orthopedics;  Laterality: Right;   THYROIDECTOMY, PARTIAL Left 2001   UMBILICAL HERNIA REPAIR  2003   WISDOM TOOTH EXTRACTION      Current Medications: Current Meds  Medication Sig   atorvastatin (LIPITOR) 40 MG tablet Take 1 tablet (40 mg total) by mouth daily.   Biotin 52841 MCG TABS Take 1 tablet by mouth daily.   Blood Glucose Monitoring Suppl (FREESTYLE LITE) w/Device KIT Use to check blood sugar 2 times  daily   carbamazepine (TEGRETOL-XR) 200 MG 12 hr tablet Take 1 tablet (200 mg total) by mouth in the morning and then take 2 tablets (400 mg total) by mouth at bedtime.   clobetasol ointment (TEMOVATE) 0.05 % APPLY 1 APPLICATION ON TO THE SKIN 2 TIMES DAILY. DO NOT USE FOR MORE THAN 7 DAYS   cyclobenzaprine (FLEXERIL) 10 MG tablet Take 1 tablet (10 mg total) by mouth at bedtime.   Dulaglutide (TRULICITY) 3 MG/0.5ML SOPN Inject 3 mg into the skin once a week.   gabapentin (NEURONTIN) 600 MG tablet Take 1 tablet (600 mg total) by mouth 3 (three) times daily.   Glucosamine HCl (GLUCOSAMINE PO) Take 3 tablets by mouth daily.   glucose blood (FREESTYLE LITE) test strip Use to check blood sugar 2 times daily   lamoTRIgine (LAMICTAL) 200 MG tablet Take 1 tablet (200 mg total) by mouth at bedtime.   Lancets (FREESTYLE) lancets Use as instructed to check  sugar 2 times daily   lisinopril (ZESTRIL) 40 MG tablet Take 1 tablet (40 mg total) by mouth daily.   Loratadine (CLARITIN PO) Take by mouth daily.   metFORMIN (GLUCOPHAGE) 1000 MG tablet Take 1 tablet (1,000 mg total) by mouth 2 (two) times daily with a meal.   Multiple Vitamin (MULTIVITAMIN) tablet Take 1 tablet by mouth daily.   spironolactone (ALDACTONE) 25 MG tablet Take 1 tablet (25 mg total) by mouth daily.   TEGRETOL-XR 200 MG 12 hr tablet Take 1 tablet by mouth every morning and take 2 tablets at bedtime   tiaGABine (GABITRIL) 4 MG tablet Take 8 mg by mouth 2 (two) times daily.   tiaGABine (GABITRIL) 4 MG tablet Take 1 tablet (4 mg total) by mouth in the morning AND 2 tablets (8 mg total) at bedtime. (Patient taking differently: Take 1 tablet (4 mg total) by mouth in the morning AND 1 tablets (4 mg total) at bedtime.)   Turmeric (QC TUMERIC COMPLEX PO) Take by mouth daily.   [DISCONTINUED] ASPIRIN PO Take 81 mg by mouth 2 (two) times daily.   Current Facility-Administered Medications for the 12/02/22 encounter (Office Visit) with Sande Rives, MD  Medication   0.9 %  sodium chloride infusion     Allergies:    Zofran [ondansetron] and Zofran [name] hcl]   Social History: Social History   Socioeconomic History   Marital status: Married    Spouse name: Not on file   Number of children: 1   Years of education: Not on file   Highest education level: Not on file  Occupational History   Occupation: nursing  Tobacco Use   Smoking status: Never   Smokeless tobacco: Never  Vaping Use   Vaping Use: Never used  Substance and Sexual Activity   Alcohol use: No    Alcohol/week: 0.0 standard drinks of alcohol   Drug use: No   Sexual activity: Yes    Partners: Male  Other Topics Concern   Not on file  Social History Narrative   Not on file   Social Determinants of Health   Financial Resource Strain: Not on file  Food Insecurity: Not on file  Transportation  Needs: Not on file  Physical Activity: Not on file  Stress: Not on file  Social Connections: Not on file     Family History: The patient's family history includes Anxiety disorder in her mother; Asthma in her father; Colon polyps (age of onset: 84) in her father; Diabetes in her mother; Heart disease  in her father and mother; Hyperlipidemia in her mother; Hypertension in her father; Lupus in her father; Mental illness in her paternal grandfather; Seizures in her paternal grandfather; Stroke in her maternal aunt and paternal grandfather. There is no history of Breast cancer, Colon cancer, Esophageal cancer, Stomach cancer, or Rectal cancer.  ROS:   All other ROS reviewed and negative. Pertinent positives noted in the HPI.     EKGs/Labs/Other Studies Reviewed:   The following studies were personally reviewed by me today:  EKG:  EKG is not ordered today.        Recent Labs: 08/28/2022: ALT 19; Hemoglobin 11.8; Platelets 333.0; TSH 1.41 09/01/2022: BUN 15; Creatinine, Ser 0.64; Potassium 4.8; Sodium 135   Recent Lipid Panel    Component Value Date/Time   CHOL 159 08/28/2022 1514   TRIG 121.0 08/28/2022 1514   HDL 60.60 08/28/2022 1514   CHOLHDL 3 08/28/2022 1514   VLDL 24.2 08/28/2022 1514   LDLCALC 74 08/28/2022 1514   LDLCALC 79 08/29/2021 1437   LDLDIRECT 112.0 06/21/2014 1542    Physical Exam:   VS:  BP (!) 150/80   Pulse 70   Ht 5' 8.5" (1.74 m)   Wt 288 lb (130.6 kg)   LMP 01/25/2008   BMI 43.15 kg/m    Wt Readings from Last 3 Encounters:  12/02/22 288 lb (130.6 kg)  09/26/22 290 lb (131.5 kg)  09/19/22 290 lb 6.4 oz (131.7 kg)    General: Well nourished, well developed, in no acute distress Head: Atraumatic, normal size  Eyes: PEERLA, EOMI  Neck: Supple, no JVD Endocrine: No thryomegaly Cardiac: Normal S1, S2; RRR; no murmurs, rubs, or gallops Lungs: Clear to auscultation bilaterally, no wheezing, rhonchi or rales  Abd: Soft, nontender, no hepatomegaly  Ext: No  edema, pulses 2+ Musculoskeletal: No deformities, BUE and BLE strength normal and equal Skin: Warm and dry, no rashes   Neuro: Alert and oriented to person, place, time, and situation, CNII-XII grossly intact, no focal deficits  Psych: Normal mood and affect   ASSESSMENT:   Alyra Deann Russo is a 52 y.o. female who presents for the following: 1. Essential hypertension   2. Hyperlipidemia LDL goal <70   3. Palpitations     PLAN:   1. Essential hypertension -Controlled at home.  No changes to medications.  2. Hyperlipidemia LDL goal <70 -Continue current cholesterol medications.  No need for aspirin.  She will work on her diabetes.  LDL goal less than 70 given diabetes.  3. Palpitations -No recurrence of symptoms.  Continue current medications.      Disposition: Return in about 1 year (around 12/02/2023).  Medication Adjustments/Labs and Tests Ordered: Current medicines are reviewed at length with the patient today.  Concerns regarding medicines are outlined above.  No orders of the defined types were placed in this encounter.  No orders of the defined types were placed in this encounter.  Patient Instructions  Medication Instructions:   STOP ASPIRIN  *If you need a refill on your cardiac medications before your next appointment, please call your pharmacy*   Follow-Up: At [facility], you and your health needs are our priority.  As part of our continuing mission to provide you with exceptional heart care, we have created designated Provider Care Teams.  These Care Teams include your primary Cardiologist (physician) and Advanced Practice Providers (APPs -  Physician Assistants and Nurse Practitioners) who all work together to provide you with the care you need, when you  need it.  We recommend signing up for the patient portal called "MyChart".  Sign up information is provided on this After Visit Summary.  MyChart is used to connect with patients for Virtual Visits  (Telemedicine).  Patients are able to view lab/test results, encounter notes, upcoming appointments, etc.  Non-urgent messages can be sent to your provider as well.   To learn more about what you can do with MyChart, go to [email or web address].    Your next appointment:   12 month(s)  Provider:   Reatha Harps, MD       Time Spent with Patient: I have spent a total of 25 minutes with patient reviewing hospital notes, telemetry, EKGs, labs and examining the patient as well as establishing an assessment and plan that was discussed with the patient.  > 50% of time was spent in direct patient care.  Signed, Lenna Gilford. Flora Lipps, MD, [facility]  [facility]  [address], [address] [place], [place] [address] [phone]  12/02/2022 4:51 PM

## 2022-11-26 ENCOUNTER — Other Ambulatory Visit (HOSPITAL_BASED_OUTPATIENT_CLINIC_OR_DEPARTMENT_OTHER): Payer: Self-pay

## 2022-11-27 ENCOUNTER — Other Ambulatory Visit (HOSPITAL_BASED_OUTPATIENT_CLINIC_OR_DEPARTMENT_OTHER): Payer: Self-pay

## 2022-11-28 ENCOUNTER — Other Ambulatory Visit (HOSPITAL_BASED_OUTPATIENT_CLINIC_OR_DEPARTMENT_OTHER): Payer: Self-pay

## 2022-12-02 ENCOUNTER — Other Ambulatory Visit: Payer: Self-pay

## 2022-12-02 ENCOUNTER — Encounter: Payer: Self-pay | Admitting: Cardiovascular Disease

## 2022-12-02 ENCOUNTER — Ambulatory Visit: Payer: Commercial Managed Care - PPO | Admitting: Cardiovascular Disease

## 2022-12-02 VITALS — BP 150/80 | HR 70 | Ht 68.5 in | Wt 288.0 lb

## 2022-12-02 DIAGNOSIS — E785 Hyperlipidemia, unspecified: Secondary | ICD-10-CM | POA: Diagnosis not present

## 2022-12-02 DIAGNOSIS — I10 Essential (primary) hypertension: Secondary | ICD-10-CM

## 2022-12-02 DIAGNOSIS — R002 Palpitations: Secondary | ICD-10-CM | POA: Diagnosis not present

## 2022-12-02 NOTE — Patient Instructions (Signed)
Medication Instructions:   STOP ASPIRIN  *If you need a refill on your cardiac medications before your next appointment, please call your pharmacy*   Follow-Up: At [facility], you and your health needs are our priority.  As part of our continuing mission to provide you with exceptional heart care, we have created designated Provider Care Teams.  These Care Teams include your primary Cardiologist (physician) and Advanced Practice Providers (APPs -  Physician Assistants and Nurse Practitioners) who all work together to provide you with the care you need, when you need it.  We recommend signing up for the patient portal called "MyChart".  Sign up information is provided on this After Visit Summary.  MyChart is used to connect with patients for Virtual Visits (Telemedicine).  Patients are able to view lab/test results, encounter notes, upcoming appointments, etc.  Non-urgent messages can be sent to your provider as well.   To learn more about what you can do with MyChart, go to [email or web address].    Your next appointment:   12 month(s)  Provider:   Reatha Harps, MD

## 2022-12-03 NOTE — Progress Notes (Signed)
NEUROLOGY FOLLOW UP OFFICE NOTE  [name] [id number]  Assessment/Plan:   Diabetic polyneuropathy    Will increase gabapentin to 800mg  three times daily.  If no improvement, will decrease dose back to 600mg  three times daily (as prescribed by her psychiatrist for mood stabilization) and will start duloxetine Will also check ferritin level.  If normal level, stop ferrous sulfate. Follow up 6 months.        Subjective:  [name] is a 52 year old female with hypertension, DM II, depression and Bipolar disorder who follows up for bilateral leg pain and neuropathy.  UPDATE: Ferritin level was 6.4.  She was advised to start ferrous sulfate 325mg  daily.  She was also started on trial of ropinirole.  She noted some improvement but continued experiencing burning in the legs, so it was discontinued.  NCV-EMG of right upper and left lower limbs on 09/29/2022 revealed mild active sensorimotor polyneuropathy.  TSH in April was 1.41.  ABI normal.     HISTORY: She began experiencing bilateral leg discomfort around 2022.  When she lays down in bed, she develops burning from her knees down to her feet.  She also develops muscle cramps or "charley horses" from her inner thighs down her legs.  Within an hour, she needs to get out of bed.  Moving her legs in the bed isn't too helpful but when she gets up to walk around, the symptoms resolve within 5 minutes.  Denies low back pain or radicular pain down the legs.  Sometimes laying on her left side may help delay discomfort of the leg cramps.  She has resorted to sleeping in the recliner.  She does have type 2 diabetes.  Hgb A1c from October was 81.  Over the past year, labs revealed TSH 1.35, B12 343, folate >24.2 and Mg 1.7.  CBC has not revealed anemia and BMP and hepatic panel have not revealed kidney or liver dysfunction. She started taking cyclobenzaprine at night to help with the leg cramps.  She already takes gabapentin 800mg  three times  daily for mood stabilizer.    PAST MEDICAL HISTORY: Past Medical History:  Diagnosis Date   Allergy    seasonal allergies   Anemia    hx of   Anxiety    on meds   Asthma    hx of   Bipolar disorder (HCC)    Cataract    bilateral sx   Chicken pox    Complication of anesthesia    oxygen saturation dropped after hysterectomy 2009 at [facility]   Depression    on meds   Diabetes mellitus    on meds   Dyspnea    GERD (gastroesophageal reflux disease)    hx of   Hyperlipidemia    on meds   Hypertension    readings   Hypothyroidism    hx of-LEFT thyroid removed   Irregular heartbeat    saw dr Myrtis Ser sees -cardiology as needed   Migraine    Sleep apnea    uses cpap    MEDICATIONS: Current Outpatient Medications on File Prior to Visit  Medication Sig Dispense Refill   atorvastatin (LIPITOR) 40 MG tablet Take 1 tablet (40 mg total) by mouth daily. 90 tablet 1   Biotin 02725 MCG TABS Take 1 tablet by mouth daily.     Blood Glucose Monitoring Suppl (FREESTYLE LITE) w/Device KIT Use to check blood sugar 2 times daily 1 kit 0   carbamazepine (TEGRETOL-XR) 200 MG 12  hr tablet Take 1 tablet (200 mg total) by mouth in the morning and then take 2 tablets (400 mg total) by mouth at bedtime. 270 tablet 0   clobetasol ointment (TEMOVATE) 0.05 % APPLY 1 APPLICATION ON TO THE SKIN 2 TIMES DAILY. DO NOT USE FOR MORE THAN 7 DAYS 60 g 1   cyclobenzaprine (FLEXERIL) 10 MG tablet Take 1 tablet (10 mg total) by mouth at bedtime. 30 tablet 3   Dulaglutide (TRULICITY) 3 MG/0.5ML SOPN Inject 3 mg into the skin once a week. 6 mL 3   gabapentin (NEURONTIN) 600 MG tablet Take 1 tablet (600 mg total) by mouth 3 (three) times daily. 90 tablet 2   Glucosamine HCl (GLUCOSAMINE PO) Take 3 tablets by mouth daily.     glucose blood (FREESTYLE LITE) test strip Use to check blood sugar 2 times daily 100 each 3   lamoTRIgine (LAMICTAL) 200 MG tablet Take 1 tablet (200 mg total) by mouth at bedtime. 90 tablet 1    Lancets (FREESTYLE) lancets Use as instructed to check sugar 2 times daily 200 each 5   lisinopril (ZESTRIL) 40 MG tablet Take 1 tablet (40 mg total) by mouth daily. 90 tablet 0   Loratadine (CLARITIN PO) Take by mouth daily.     metFORMIN (GLUCOPHAGE) 1000 MG tablet Take 1 tablet (1,000 mg total) by mouth 2 (two) times daily with a meal. 180 tablet 3   Multiple Vitamin (MULTIVITAMIN) tablet Take 1 tablet by mouth daily.     spironolactone (ALDACTONE) 25 MG tablet Take 1 tablet (25 mg total) by mouth daily. 90 tablet 1   TEGRETOL-XR 200 MG 12 hr tablet Take 1 tablet by mouth every morning and take 2 tablets at bedtime 270 tablet 0   tiaGABine (GABITRIL) 4 MG tablet Take 8 mg by mouth 2 (two) times daily.     tiaGABine (GABITRIL) 4 MG tablet Take 1 tablet (4 mg total) by mouth in the morning AND 2 tablets (8 mg total) at bedtime. (Patient taking differently: Take 1 tablet (4 mg total) by mouth in the morning AND 1 tablets (4 mg total) at bedtime.) 270 tablet 0   Turmeric (QC TUMERIC COMPLEX PO) Take by mouth daily.     Current Facility-Administered Medications on File Prior to Visit  Medication Dose Route Frequency Provider Last Rate Last Admin   0.9 %  sodium chloride infusion  500 mL Intravenous Once Nandigam, Eleonore Chiquito, MD        ALLERGIES: Allergies  Allergen Reactions   Zofran [Ondansetron] Swelling   Zofran [Ondansetron Hcl] Other (See Comments)    Severe headache    FAMILY HISTORY: Family History  Problem Relation Age of Onset   Hyperlipidemia Mother    Diabetes Mother    Anxiety disorder Mother    Heart disease Mother    Hypertension Father    Lupus Father    Heart disease Father    Asthma Father    Colon polyps Father 65   Stroke Maternal Aunt    Seizures Paternal Grandfather    Stroke Paternal Grandfather    Mental illness Paternal Grandfather    Breast cancer Neg Hx    Colon cancer Neg Hx    Esophageal cancer Neg Hx    Stomach cancer Neg Hx    Rectal cancer Neg  Hx       Objective:  Blood pressure (!) 149/83, pulse 74, height 5\' 8"  (1.727 m), weight 290 lb 6.4 oz (131.7 kg), last menstrual period  01/25/2008, SpO2 98%. General: No acute distress.  Patient appears well-groomed.      Shon Millet, DO  CC: Neena Rhymes, MD

## 2022-12-04 ENCOUNTER — Ambulatory Visit: Payer: Commercial Managed Care - PPO | Admitting: Neurology

## 2022-12-04 ENCOUNTER — Other Ambulatory Visit (INDEPENDENT_AMBULATORY_CARE_PROVIDER_SITE_OTHER): Payer: Commercial Managed Care - PPO

## 2022-12-04 ENCOUNTER — Encounter: Payer: Self-pay | Admitting: Neurology

## 2022-12-04 ENCOUNTER — Other Ambulatory Visit (HOSPITAL_BASED_OUTPATIENT_CLINIC_OR_DEPARTMENT_OTHER): Payer: Self-pay

## 2022-12-04 ENCOUNTER — Other Ambulatory Visit: Payer: Commercial Managed Care - PPO

## 2022-12-04 ENCOUNTER — Other Ambulatory Visit: Payer: Self-pay

## 2022-12-04 VITALS — BP 149/83 | HR 74 | Ht 68.0 in | Wt 290.4 lb

## 2022-12-04 DIAGNOSIS — E611 Iron deficiency: Secondary | ICD-10-CM | POA: Diagnosis not present

## 2022-12-04 DIAGNOSIS — E1142 Type 2 diabetes mellitus with diabetic polyneuropathy: Secondary | ICD-10-CM | POA: Diagnosis not present

## 2022-12-04 MED ORDER — GABAPENTIN 800 MG PO TABS
800.0000 mg | ORAL_TABLET | Freq: Three times a day (TID) | ORAL | 5 refills | Status: DC
  Filled 2022-12-04: qty 90, 30d supply, fill #0
  Filled 2022-12-29: qty 90, 30d supply, fill #1
  Filled 2023-01-26: qty 90, 30d supply, fill #2
  Filled 2023-02-25: qty 90, 30d supply, fill #3
  Filled 2023-03-23: qty 90, 30d supply, fill #4

## 2022-12-04 NOTE — Patient Instructions (Signed)
Increase gabapentin to 800mg  three times daily.  If no improvement by 6 weeks, contact me and I will decrease dose back to 600mg  and we will start duloxetine Check ferritin level Follow up 6 months.

## 2022-12-05 ENCOUNTER — Other Ambulatory Visit: Payer: Self-pay

## 2022-12-05 LAB — FERRITIN: Ferritin: 23.9 ng/mL (ref 10.0–291.0)

## 2022-12-08 NOTE — Progress Notes (Signed)
 Patient advise.  

## 2022-12-11 NOTE — Progress Notes (Deleted)
[name] [address] [place] [address] Phone: [phone] Subjective:    I'm seeing this patient by the request  of:  Sheliah Hatch, MD  CC:   [id number]  [name] is a 52 y.o. female coming in with complaint of leg cramps  Onset-  Location Duration-  Character- Aggravating factors- Reliving factors-  Therapies tried-  Severity-     Past Medical History:  Diagnosis Date   Allergy    seasonal allergies   Anemia    hx of   Anxiety    on meds   Asthma    hx of   Bipolar disorder (HCC)    Cataract    bilateral sx   Chicken pox    Complication of anesthesia    oxygen saturation dropped after hysterectomy 2009 at [facility]   Depression    on meds   Diabetes mellitus    on meds   Dyspnea    GERD (gastroesophageal reflux disease)    hx of   Hyperlipidemia    on meds   Hypertension    readings   Hypothyroidism    hx of-LEFT thyroid removed   Irregular heartbeat    saw dr Myrtis Ser sees -cardiology as needed   Migraine    Sleep apnea    uses cpap   Past Surgical History:  Procedure Laterality Date   ANKLE SURGERY Left 1989   BACK SURGERY  1999   CATARACT EXTRACTION, BILATERAL     CESAREAN SECTION     1995   LAPAROSCOPIC ASSISTED VAGINAL HYSTERECTOMY  2009     BSO fibroids, DUB, pelvic pain   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 REPAIR N/A 04/14/2016   Procedure: LAPAROSCOPIC ROUX-EN-Y GASTRIC BYPASS  WITH UPPER ENDOSCOPY;  Surgeon: Glenna Fellows, MD;  Location: [facility];  Service: General;  Laterality: N/A;   SHOULDER ARTHROSCOPY WITH OPEN ROTATOR CUFF REPAIR Right 01/02/2017   Procedure: Right shoulder arthroscopy, A-subcromial decompression, mini open rotator cuff repair, open distal clavicle resection, biceps tenodesis;  Surgeon: Beverely Low, MD;  Location: [facility] OR;  Service: Orthopedics;  Laterality: Right;   THYROIDECTOMY, PARTIAL Left 2001   UMBILICAL HERNIA REPAIR  2003    WISDOM TOOTH EXTRACTION     Social History   Socioeconomic History   Marital status: Married    Spouse name: Not on file   Number of children: 1   Years of education: Not on file   Highest education level: Not on file  Occupational History   Occupation: nursing  Tobacco Use   Smoking status: Never   Smokeless tobacco: Never  Vaping Use   Vaping status: Never Used  Substance and Sexual Activity   Alcohol use: No    Alcohol/week: 0.0 standard drinks of alcohol   Drug use: No   Sexual activity: Yes    Partners: Male  Other Topics Concern   Not on file  Social History Narrative   Not on file   Social Determinants of Health   Financial Resource Strain: Not on file  Food Insecurity: Not on file  Transportation Needs: Not on file  Physical Activity: Not on file  Stress: Not on file  Social Connections: Not on file   Allergies  Allergen Reactions   Zofran [Ondansetron] Swelling   Zofran [Ondansetron Hcl] Other (See Comments)    Severe headache   Family History  Problem Relation Age of Onset   Hyperlipidemia Mother    Diabetes Mother  Anxiety disorder Mother    Heart disease Mother    Hypertension Father    Lupus Father    Heart disease Father    Asthma Father    Colon polyps Father 79   Stroke Maternal Aunt    Seizures Paternal Grandfather    Stroke Paternal Grandfather    Mental illness Paternal Grandfather    Breast cancer Neg Hx    Colon cancer Neg Hx    Esophageal cancer Neg Hx    Stomach cancer Neg Hx    Rectal cancer Neg Hx     Current Outpatient Medications (Endocrine & Metabolic):    Dulaglutide (TRULICITY) 3 MG/0.5ML SOPN, Inject 3 mg into the skin once a week.   metFORMIN (GLUCOPHAGE) 1000 MG tablet, Take 1 tablet (1,000 mg total) by mouth 2 (two) times daily with a meal.   Current Outpatient Medications (Cardiovascular):    atorvastatin (LIPITOR) 40 MG tablet, Take 1 tablet (40 mg total) by mouth daily.   lisinopril (ZESTRIL) 40 MG tablet,  Take 1 tablet (40 mg total) by mouth daily.   spironolactone (ALDACTONE) 25 MG tablet, Take 1 tablet (25 mg total) by mouth daily.   Current Outpatient Medications (Respiratory):    Loratadine (CLARITIN PO), Take by mouth daily.       Current Outpatient Medications (Other):    Biotin 40981 MCG TABS, Take 1 tablet by mouth daily.   Blood Glucose Monitoring Suppl (FREESTYLE LITE) w/Device KIT, Use to check blood sugar 2 times daily   carbamazepine (TEGRETOL-XR) 200 MG 12 hr tablet, Take 1 tablet (200 mg total) by mouth in the morning and then take 2 tablets (400 mg total) by mouth at bedtime.   clobetasol ointment (TEMOVATE) 0.05 %, APPLY 1 APPLICATION ON TO THE SKIN 2 TIMES DAILY. DO NOT USE FOR MORE THAN 7 DAYS   cyclobenzaprine (FLEXERIL) 10 MG tablet, Take 1 tablet (10 mg total) by mouth at bedtime.   gabapentin (NEURONTIN) 800 MG tablet, Take 1 tablet (800 mg total) by mouth 3 (three) times daily.   Glucosamine HCl (GLUCOSAMINE PO), Take 3 tablets by mouth daily.   glucose blood (FREESTYLE LITE) test strip, Use to check blood sugar 2 times daily   lamoTRIgine (LAMICTAL) 200 MG tablet, Take 1 tablet (200 mg total) by mouth at bedtime.   Lancets (FREESTYLE) lancets, Use as instructed to check sugar 2 times daily   Multiple Vitamin (MULTIVITAMIN) tablet, Take 1 tablet by mouth daily.   TEGRETOL-XR 200 MG 12 hr tablet, Take 1 tablet by mouth every morning and take 2 tablets at bedtime   tiaGABine (GABITRIL) 4 MG tablet, Take 8 mg by mouth 2 (two) times daily.   tiaGABine (GABITRIL) 4 MG tablet, Take 1 tablet (4 mg total) by mouth in the morning AND 2 tablets (8 mg total) at bedtime. (Patient taking differently: Take 1 tablet (4 mg total) by mouth in the morning AND 1 tablets (4 mg total) at bedtime.)   Turmeric (QC TUMERIC COMPLEX PO), Take by mouth daily.  Current Facility-Administered Medications (Other):    0.9 %  sodium chloride infusion   Reviewed prior external information  including notes and imaging from  primary care provider As well as notes that were available from care everywhere and other healthcare systems.  Past medical history, social, surgical and family history all reviewed in electronic medical record.  No pertanent information unless stated regarding to the chief complaint.   Review of Systems:  No headache, visual changes, nausea, vomiting,  diarrhea, constipation, dizziness, abdominal pain, skin rash, fevers, chills, night sweats, weight loss, swollen lymph nodes, body aches, joint swelling, chest pain, shortness of breath, mood changes. POSITIVE muscle aches  Objective  Last menstrual period 01/25/2008.   General: No apparent distress alert and oriented x3 mood and affect normal, dressed appropriately.  HEENT: Pupils equal, extraocular movements intact  Respiratory: Patient's speak in full sentences and does not appear short of breath  Cardiovascular: No lower extremity edema, non tender, no erythema      Impression and Recommendations:

## 2022-12-15 ENCOUNTER — Encounter: Payer: Self-pay | Admitting: Family Medicine

## 2022-12-15 ENCOUNTER — Other Ambulatory Visit (HOSPITAL_BASED_OUTPATIENT_CLINIC_OR_DEPARTMENT_OTHER): Payer: Self-pay

## 2022-12-15 ENCOUNTER — Telehealth: Payer: Commercial Managed Care - PPO | Admitting: Family Medicine

## 2022-12-15 VITALS — BP 162/89

## 2022-12-15 DIAGNOSIS — U071 COVID-19: Secondary | ICD-10-CM

## 2022-12-15 MED ORDER — GUAIFENESIN-CODEINE 100-10 MG/5ML PO SOLN
5.0000 mL | Freq: Four times a day (QID) | ORAL | 0 refills | Status: DC | PRN
Start: 2022-12-15 — End: 2023-03-04
  Filled 2022-12-15: qty 120, 3d supply, fill #0

## 2022-12-15 NOTE — Patient Instructions (Signed)
Antiviral is contraindicated with your medications. Codeine cough syrup ordered if needed but continue mucinex, tylenol or motrin if needed short term, fluids and rest. I expect you to be improving in next few days.  Return to the clinic or go to the nearest emergency room if any of your symptoms worsen or new symptoms occur. Hang in there.

## 2022-12-15 NOTE — Progress Notes (Signed)
Virtual Visit via Video Note  I connected with [name] on 12/15/22 at 2:17 PM by a video enabled telemedicine application and verified that I am speaking with the correct person using two identifiers.  Patient location: at home, husband in other room, consent obtained.  My location: office - [place].    I discussed the limitations, risks, security and privacy concerns of performing an evaluation and management service by telephone and the availability of in person appointments. I also discussed with the patient that there may be a patient responsible charge related to this service. The patient expressed understanding and agreed to proceed, consent obtained  Chief complaint:  Chief Complaint  Patient presents with   Covid Positive    Pt tested positive Saturday, sxs started Saturday morning, congestion, cough, sinus drainage, body aches, headache, chills, denied fever     History of Present Illness: [name] is a 52 y.o. female  Covid 4 infection: Initial symptoms in the morning on 7/20.  Congestion and cough, sinus drainage, body aches, headache chills - last night, then resolved.  No measured fever.  Positive home test on 7/20. Denies chest pain or dyspnea (with coughing only - not at rest).  No confusion.  Has been able to drink fluids without difficulty.  Home treatments: mucinex, sudafed, robitussin cough. Helps with cough. Motrin/tylenol.  Last covid infection 01/2022, no recent booster.  Took paxlovid prior and tolerated - requests again.  On lipitor for statin, and tegretol - contraindicated with paxlovid.  eGFR 102 on 09/01/22.  Out of work for initial 5 days per [employer].  Immunization History  Administered Date(s) Administered   Influenza Split 03/13/2015   Influenza,inj,Quad PF,6+ Mos 02/21/2011, 04/05/2013, 02/22/2014, 02/08/2016, 02/18/2018, 02/12/2019   Influenza,trivalent, recombinat, inj, PF 02/26/2022   Influenza-Unspecified  02/21/2011, 02/19/2017, 02/23/2021   Pfizer Covid-19 Vaccine Bivalent Booster 29yrs & up 07/13/2019   Pneumococcal Polysaccharide-23 07/25/2001   Tdap 10/29/2007, 02/19/2018   Zoster, Live 08/28/2022   Patient Active Problem List   Diagnosis Date Noted   Leg cramps 08/28/2022   DDD (degenerative disc disease), lumbar 02/27/2022   Heartburn 11/19/2020   Lumbar pain 03/08/2020   Palpitations 01/13/2020   S/P bariatric surgery 07/12/2019   Type 2 diabetes mellitus without complications (HCC) 10/05/2015   Hypersomnia with sleep apnea 06/20/2015   Right lumbar radiculopathy 03/13/2014   Lichen simplex chronicus 09/21/2013   Benign paroxysmal positional vertigo 02/03/2013   General medical examination 08/06/2011   SINUS TACHYCARDIA 08/22/2008   GERD 08/22/2008   HYPERLIPIDEMIA, MIXED 05/25/2007   Morbid obesity (HCC) 05/25/2007   Bipolar disorder (HCC) 05/25/2007   MIGRAINE, CHRONIC 05/25/2007   Essential hypertension 05/25/2007   Allergic rhinitis 05/25/2007   Obstructive sleep apnea 05/17/2007   Past Medical History:  Diagnosis Date   Allergy    seasonal allergies   Anemia    hx of   Anxiety    on meds   Asthma    hx of   Bipolar disorder (HCC)    Cataract    bilateral sx   Chicken pox    Complication of anesthesia    oxygen saturation dropped after hysterectomy 2009 at [facility]   Depression    on meds   Diabetes mellitus    on meds   Dyspnea    GERD (gastroesophageal reflux disease)    hx of   Hyperlipidemia    on meds   Hypertension    readings   Hypothyroidism  hx of-LEFT thyroid removed   Irregular heartbeat    saw dr Myrtis Ser sees -cardiology as needed   Migraine    Sleep apnea    uses cpap   Past Surgical History:  Procedure Laterality Date   ANKLE SURGERY Left 1989   BACK SURGERY  1999   CATARACT EXTRACTION, BILATERAL     CESAREAN SECTION     1995   LAPAROSCOPIC ASSISTED VAGINAL HYSTERECTOMY  2009     BSO fibroids, DUB, pelvic pain   LAPAROSCOPIC  ROUX-EN-Y GASTRIC BYPASS WITH HIATAL HERNIA REPAIR N/A 04/14/2016   Procedure: LAPAROSCOPIC ROUX-EN-Y GASTRIC BYPASS  WITH UPPER ENDOSCOPY;  Surgeon: Glenna Fellows, MD;  Location: [facility];  Service: General;  Laterality: N/A;   SHOULDER ARTHROSCOPY WITH OPEN ROTATOR CUFF REPAIR Right 01/02/2017   Procedure: Right shoulder arthroscopy, A-subcromial decompression, mini open rotator cuff repair, open distal clavicle resection, biceps tenodesis;  Surgeon: Beverely Low, MD;  Location: [facility] OR;  Service: Orthopedics;  Laterality: Right;   THYROIDECTOMY, PARTIAL Left 2001   UMBILICAL HERNIA REPAIR  2003   WISDOM TOOTH EXTRACTION     Allergies  Allergen Reactions   Zofran [Ondansetron] Swelling   Zofran [Ondansetron Hcl] Other (See Comments)    Severe headache   Prior to Admission medications   Medication Sig Start Date End Date Taking? Authorizing Provider  atorvastatin (LIPITOR) 40 MG tablet Take 1 tablet (40 mg total) by mouth daily. 09/29/22  Yes Sheliah Hatch, MD  Biotin 16109 MCG TABS Take 1 tablet by mouth daily.   Yes [provider]  Blood Glucose Monitoring Suppl (FREESTYLE LITE) w/Device KIT Use to check blood sugar 2 times daily 07/01/21  Yes Carlus Pavlov, MD  carbamazepine (TEGRETOL-XR) 200 MG 12 hr tablet Take 1 tablet (200 mg total) by mouth in the morning and then take 2 tablets (400 mg total) by mouth at bedtime. 09/23/22  Yes   clobetasol ointment (TEMOVATE) 0.05 % APPLY 1 APPLICATION ON TO THE SKIN 2 TIMES DAILY. DO NOT USE FOR MORE THAN 7 DAYS 05/29/22 05/29/23 Yes Sheliah Hatch, MD  cyclobenzaprine (FLEXERIL) 10 MG tablet Take 1 tablet (10 mg total) by mouth at bedtime. 11/10/22  Yes Sheliah Hatch, MD  Dulaglutide (TRULICITY) 3 MG/0.5ML SOPN Inject 3 mg into the skin once a week. 02/27/22  Yes Carlus Pavlov, MD  gabapentin (NEURONTIN) 800 MG tablet Take 1 tablet (800 mg total) by mouth 3 (three) times daily. 12/04/22  Yes Jaffe, Adam R, DO  Glucosamine  HCl (GLUCOSAMINE PO) Take 3 tablets by mouth daily.   Yes [provider]  glucose blood (FREESTYLE LITE) test strip Use to check blood sugar 2 times daily 09/25/22  Yes Carlus Pavlov, MD  lamoTRIgine (LAMICTAL) 200 MG tablet Take 1 tablet (200 mg total) by mouth at bedtime. 09/11/22  Yes   Lancets (FREESTYLE) lancets Use as instructed to check sugar 2 times daily 12/15/19  Yes Carlus Pavlov, MD  lisinopril (ZESTRIL) 40 MG tablet Take 1 tablet (40 mg total) by mouth daily. 09/30/22  Yes O'Neal, Ronnald Ramp, MD  Loratadine (CLARITIN PO) Take by mouth daily.   Yes [provider]  metFORMIN (GLUCOPHAGE) 1000 MG tablet Take 1 tablet (1,000 mg total) by mouth 2 (two) times daily with a meal. 02/27/22 03/12/23 Yes Carlus Pavlov, MD  Multiple Vitamin (MULTIVITAMIN) tablet Take 1 tablet by mouth daily.   Yes [provider]  spironolactone (ALDACTONE) 25 MG tablet Take 1 tablet (25 mg total) by mouth daily.  09/04/22  Yes O'Neal, Ronnald Ramp, MD  TEGRETOL-XR 200 MG 12 hr tablet Take 1 tablet by mouth every morning and take 2 tablets at bedtime 09/19/21  Yes   tiaGABine (GABITRIL) 4 MG tablet Take 8 mg by mouth 2 (two) times daily.   Yes [provider]  tiaGABine (GABITRIL) 4 MG tablet Take 1 tablet (4 mg total) by mouth in the morning AND 2 tablets (8 mg total) at bedtime. Patient taking differently: Take 1 tablet (4 mg total) by mouth in the morning AND 1 tablets (4 mg total) at bedtime. 06/11/22  Yes   Turmeric (QC TUMERIC COMPLEX PO) Take by mouth daily.   Yes [provider]   Social History   Socioeconomic History   Marital status: Married    Spouse name: Not on file   Number of children: 1   Years of education: Not on file   Highest education level: Not on file  Occupational History   Occupation: nursing  Tobacco Use   Smoking status: Never   Smokeless tobacco: Never  Vaping Use   Vaping status: Never Used  Substance and Sexual Activity    Alcohol use: No    Alcohol/week: 0.0 standard drinks of alcohol   Drug use: No   Sexual activity: Yes    Partners: Male  Other Topics Concern   Not on file  Social History Narrative   Not on file   Social Determinants of Health   Financial Resource Strain: Not on file  Food Insecurity: Not on file  Transportation Needs: Not on file  Physical Activity: Not on file  Stress: Not on file  Social Connections: Not on file  Intimate Partner Violence: Not on file    Observations/Objective: Vitals:   12/15/22 1345  BP: (!) 162/89   BP Readings from Last 3 Encounters:  12/15/22 (!) 162/89  12/04/22 (!) 149/83  12/02/22 (!) 150/80   Nontoxic appearance on video.  Speaking full sentences without respiratory distress.  Appropriate responses, all questions answered with understanding of plan expressed.  No significant cough during video visit.   Assessment and Plan: COVID-19 virus infection Day 2 of COVID-19 infection, no concerning symptoms time, appears to be appropriate for continued outpatient treatment.  Symptomatic care discussed, codeine cough syrup provided if needed with caution on combined side effects with her other medications.  Briefly discussed antiviral but given her other medications and contraindications decided against that at this time, especially with her current overall mild symptoms.  ER/urgent care precautions given.  Out of work for 5 days then likely 5 days of masking.  Follow Up Instructions: As needed.    Patient Instructions  Antiviral is contraindicated with your medications. Codeine cough syrup ordered if needed but continue mucinex, tylenol or motrin if needed short term, fluids and rest. I expect you to be improving in next few days.  Return to the clinic or go to the nearest emergency room if any of your symptoms worsen or new symptoms occur. Hang in there.    I discussed the assessment and treatment plan with the patient. The patient was provided  an opportunity to ask questions and all were answered. The patient agreed with the plan and demonstrated an understanding of the instructions.   The patient was advised to call back or seek an in-person evaluation if the symptoms worsen or if the condition fails to improve as anticipated.   Shade Flood, MD

## 2022-12-19 ENCOUNTER — Ambulatory Visit: Payer: Commercial Managed Care - PPO | Admitting: Family Medicine

## 2022-12-22 ENCOUNTER — Other Ambulatory Visit (HOSPITAL_BASED_OUTPATIENT_CLINIC_OR_DEPARTMENT_OTHER): Payer: Self-pay

## 2022-12-24 ENCOUNTER — Other Ambulatory Visit: Payer: Self-pay

## 2022-12-25 ENCOUNTER — Encounter: Payer: Self-pay | Admitting: Family Medicine

## 2022-12-25 ENCOUNTER — Other Ambulatory Visit (HOSPITAL_BASED_OUTPATIENT_CLINIC_OR_DEPARTMENT_OTHER): Payer: Self-pay

## 2022-12-25 ENCOUNTER — Ambulatory Visit: Payer: Commercial Managed Care - PPO | Admitting: Family Medicine

## 2022-12-25 VITALS — BP 128/80 | HR 75 | Temp 98.2°F | Resp 18 | Ht 68.0 in | Wt 286.1 lb

## 2022-12-25 DIAGNOSIS — L729 Follicular cyst of the skin and subcutaneous tissue, unspecified: Secondary | ICD-10-CM

## 2022-12-25 DIAGNOSIS — L089 Local infection of the skin and subcutaneous tissue, unspecified: Secondary | ICD-10-CM | POA: Diagnosis not present

## 2022-12-25 MED ORDER — CARBAMAZEPINE ER 200 MG PO TB12
ORAL_TABLET | ORAL | 0 refills | Status: DC
  Filled 2022-12-25: qty 43, 15d supply, fill #0
  Filled 2022-12-25 (×2): qty 270, 90d supply, fill #0
  Filled 2022-12-25: qty 227, 75d supply, fill #0

## 2022-12-25 MED ORDER — DOXYCYCLINE HYCLATE 100 MG PO TABS
100.0000 mg | ORAL_TABLET | Freq: Two times a day (BID) | ORAL | 0 refills | Status: DC
  Filled 2022-12-25: qty 14, 7d supply, fill #0

## 2022-12-25 NOTE — Patient Instructions (Signed)
Follow up as needed or as scheduled START the Doxycycline twice daily- take w/ food Hot compresses to help bring the area to a head and drain We'll call you with your surgical referral Call with any questions or concerns Hang in there!!!

## 2022-12-25 NOTE — Progress Notes (Signed)
   Subjective:    Patient ID: [name], female    DOB: 06-02-1970, 52 y.o.   MRN: [id number]  HPI Cyst- on back of neck.  Area is getting larger and is now painful.  Now hard when it wasn't before.  Pt is very worried bc dad died of neck cancer   Review of Systems For ROS see HPI     Objective:   Physical Exam Vitals reviewed.  Constitutional:      General: She is not in acute distress.    Appearance: Normal appearance. She is obese. She is not ill-appearing.  HENT:     Head: Normocephalic and atraumatic.  Lymphadenopathy:     Cervical: No cervical adenopathy.  Skin:    General: Skin is warm and dry.     Comments: 2.5 cm firm, mobile cystic like mass on L posterior neck that is TTP.  No fluctuance or overlying redness.  Neurological:     General: No focal deficit present.     Mental Status: She is alert and oriented to person, place, and time.  Psychiatric:        Mood and Affect: Mood normal.        Behavior: Behavior normal.        Thought Content: Thought content normal.           Assessment & Plan:  Infected cyst- new.  Given that area has enlarged and is now painful, suspect that it is infected beneath the skin.  Will start abx and encouraged hot compresses to the area.  Will refer to surgery for possible excision.  Pt expressed understanding and is in agreement w/ plan.

## 2022-12-26 ENCOUNTER — Other Ambulatory Visit (HOSPITAL_BASED_OUTPATIENT_CLINIC_OR_DEPARTMENT_OTHER): Payer: Self-pay

## 2022-12-29 ENCOUNTER — Other Ambulatory Visit (HOSPITAL_BASED_OUTPATIENT_CLINIC_OR_DEPARTMENT_OTHER): Payer: Self-pay

## 2022-12-29 ENCOUNTER — Other Ambulatory Visit: Payer: Self-pay | Admitting: Cardiovascular Disease

## 2022-12-29 ENCOUNTER — Other Ambulatory Visit: Payer: Self-pay

## 2022-12-29 MED ORDER — LISINOPRIL 40 MG PO TABS
40.0000 mg | ORAL_TABLET | Freq: Every day | ORAL | 0 refills | Status: DC
  Filled 2022-12-29: qty 90, 90d supply, fill #0

## 2022-12-31 ENCOUNTER — Other Ambulatory Visit (HOSPITAL_BASED_OUTPATIENT_CLINIC_OR_DEPARTMENT_OTHER): Payer: Self-pay

## 2022-12-31 MED ORDER — GABAPENTIN 600 MG PO TABS
600.0000 mg | ORAL_TABLET | Freq: Three times a day (TID) | ORAL | 2 refills | Status: DC
Start: 2022-12-31 — End: 2023-03-04
  Filled 2022-12-31: qty 90, 30d supply, fill #0

## 2022-12-31 MED ORDER — LAMOTRIGINE 200 MG PO TABS
200.0000 mg | ORAL_TABLET | Freq: Every day | ORAL | 1 refills | Status: DC
  Filled 2022-12-31 – 2023-03-04 (×2): qty 90, 90d supply, fill #0
  Filled 2023-06-02 – 2023-08-12 (×3): qty 90, 90d supply, fill #1
  Filled ????-??-??: fill #1

## 2022-12-31 MED ORDER — ARIPIPRAZOLE 5 MG PO TABS
5.0000 mg | ORAL_TABLET | Freq: Every day | ORAL | 2 refills | Status: DC
  Filled 2022-12-31: qty 30, 30d supply, fill #0
  Filled 2023-01-26: qty 30, 30d supply, fill #1
  Filled 2023-02-23: qty 30, 30d supply, fill #2

## 2022-12-31 MED ORDER — TEGRETOL-XR 200 MG PO TB12
ORAL_TABLET | ORAL | 0 refills | Status: DC
  Filled 2022-12-31 – 2023-06-03 (×21): qty 270, 90d supply, fill #0

## 2022-12-31 MED ORDER — TIAGABINE HCL 4 MG PO TABS
ORAL_TABLET | ORAL | 0 refills | Status: DC
  Filled 2022-12-31: qty 270, 90d supply, fill #0

## 2023-01-01 ENCOUNTER — Other Ambulatory Visit (HOSPITAL_BASED_OUTPATIENT_CLINIC_OR_DEPARTMENT_OTHER): Payer: Self-pay

## 2023-01-01 ENCOUNTER — Other Ambulatory Visit: Payer: Self-pay

## 2023-01-05 ENCOUNTER — Other Ambulatory Visit (HOSPITAL_BASED_OUTPATIENT_CLINIC_OR_DEPARTMENT_OTHER): Payer: Self-pay

## 2023-01-05 ENCOUNTER — Other Ambulatory Visit: Payer: Self-pay

## 2023-01-09 ENCOUNTER — Other Ambulatory Visit (HOSPITAL_COMMUNITY): Payer: Self-pay

## 2023-01-22 NOTE — Progress Notes (Unsigned)
[name] [address] [place] [address] Phone: [phone] Subjective:   INadine Counts, am serving as a scribe for Dr. Antoine Primas.  I'm seeing this patient by the request  of:  Sheliah Hatch, MD  CC: Cramping and leg  [id number]  [name] is a 52 y.o. female coming in with complaint of cramps in legs.  Patient's past medical history is significant for diabetic polyneuropathy seen by neurology.  Patient recently did see her neurologist and they did increase gabapentin to 800 mg 3 times a day.  And was to start Cymbalta as well.  Patient has already had nerve conduction studies showing that there is some mild polyneuropathy noted.  ABIs have been done that were unremarkable.  Laboratory workup included ferritin which has been corrected over the last 6 months from 6.5-23.9.  Unfortunately A1c has increased over the last year from 8.1-9.1 patient's most recent CBC in April did show a lower hemoglobin with an increasing RDW but also fairly significant eosinophils  Patient is on multiple medications including Lamictal, loratadine, Abilify, duloxetine, gabapentin. Leg cramps over the past 2 years. The last year has been more consistent. Thigh cramps and can only sleep 2-3 hours if she takes a muscle relaxer. Left foot on the lateral side pain started about 4-5 months ago and 2 days ago pain greatly intensified especially with pressure.    Past Medical History:  Diagnosis Date   Allergy    seasonal allergies   Anemia    hx of   Anxiety    on meds   Asthma    hx of   Bipolar disorder (HCC)    Cataract    bilateral sx   Chicken pox    Complication of anesthesia    oxygen saturation dropped after hysterectomy 2009 at [facility]   Depression    on meds   Diabetes mellitus    on meds   Dyspnea    GERD (gastroesophageal reflux disease)    hx of   Hyperlipidemia    on meds   Hypertension    readings   Hypothyroidism    hx  of-LEFT thyroid removed   Irregular heartbeat    saw dr Myrtis Ser sees -cardiology as needed   Migraine    Sleep apnea    uses cpap   Past Surgical History:  Procedure Laterality Date   ANKLE SURGERY Left 1989   BACK SURGERY  1999   CATARACT EXTRACTION, BILATERAL     CESAREAN SECTION     1995   LAPAROSCOPIC ASSISTED VAGINAL HYSTERECTOMY  2009     BSO fibroids, DUB, pelvic pain   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 REPAIR N/A 04/14/2016   Procedure: LAPAROSCOPIC ROUX-EN-Y GASTRIC BYPASS  WITH UPPER ENDOSCOPY;  Surgeon: Glenna Fellows, MD;  Location: [facility] ORS;  Service: General;  Laterality: N/A;   SHOULDER ARTHROSCOPY WITH OPEN ROTATOR CUFF REPAIR Right 01/02/2017   Procedure: Right shoulder arthroscopy, A-subcromial decompression, mini open rotator cuff repair, open distal clavicle resection, biceps tenodesis;  Surgeon: Beverely Low, MD;  Location: [facility] OR;  Service: Orthopedics;  Laterality: Right;   THYROIDECTOMY, PARTIAL Left 2001   UMBILICAL HERNIA REPAIR  2003   WISDOM TOOTH EXTRACTION     Social History   Socioeconomic History   Marital status: Married    Spouse name: Not on file   Number of children: 1   Years of education: Not on file   Highest education level:  Associate degree: academic program  Occupational History   Occupation: nursing  Tobacco Use   Smoking status: Never   Smokeless tobacco: Never  Vaping Use   Vaping status: Never Used  Substance and Sexual Activity   Alcohol use: No    Alcohol/week: 0.0 standard drinks of alcohol   Drug use: No   Sexual activity: Yes    Partners: Male  Other Topics Concern   Not on file  Social History Narrative   Not on file   Social Determinants of Health   Financial Resource Strain: Low Risk  (12/24/2022)   Overall Financial Resource Strain (CARDIA)    Difficulty of Paying Living Expenses: Not hard at all  Food Insecurity: No Food Insecurity (12/24/2022)   Hunger Vital Sign    Worried About  Running Out of Food in the Last Year: Never true    Ran Out of Food in the Last Year: Never true  Transportation Needs: No Transportation Needs (12/24/2022)   PRAPARE - Administrator, Civil Service (Medical): No    Lack of Transportation (Non-Medical): No  Physical Activity: Unknown (12/24/2022)   Exercise Vital Sign    Days of Exercise per Week: 0 days    Minutes of Exercise per Session: Not on file  Stress: No Stress Concern Present (12/24/2022)   [employer] of Occupational Health - Occupational Stress Questionnaire    Feeling of Stress : Not at all  Social Connections: Moderately Isolated (12/24/2022)   Social Connection and Isolation Panel [NHANES]    Frequency of Communication with Friends and Family: Twice a week    Frequency of Social Gatherings with Friends and Family: Once a week    Attends Religious Services: Never    Database administrator or Organizations: No    Attends Engineer, structural: Not on file    Marital Status: Married   Allergies  Allergen Reactions   Zofran [Ondansetron] Swelling   Family History  Problem Relation Age of Onset   Hyperlipidemia Mother    Diabetes Mother    Anxiety disorder Mother    Heart disease Mother    Hypertension Father    Lupus Father    Heart disease Father    Asthma Father    Colon polyps Father 8   Stroke Maternal Aunt    Seizures Paternal Grandfather    Stroke Paternal Grandfather    Mental illness Paternal Grandfather    Breast cancer Neg Hx    Colon cancer Neg Hx    Esophageal cancer Neg Hx    Stomach cancer Neg Hx    Rectal cancer Neg Hx     Current Outpatient Medications (Endocrine & Metabolic):    Dulaglutide (TRULICITY) 3 MG/0.5ML SOPN, Inject 3 mg into the skin once a week.   metFORMIN (GLUCOPHAGE) 1000 MG tablet, Take 1 tablet (1,000 mg total) by mouth 2 (two) times daily with a meal.   Current Outpatient Medications (Cardiovascular):    atorvastatin (LIPITOR) 40 MG tablet, Take  1 tablet (40 mg total) by mouth daily.   lisinopril (ZESTRIL) 40 MG tablet, Take 1 tablet (40 mg total) by mouth daily.   spironolactone (ALDACTONE) 25 MG tablet, Take 1 tablet (25 mg total) by mouth daily.   Current Outpatient Medications (Respiratory):    guaiFENesin-codeine 100-10 MG/5ML syrup, Take 5-10 mLs by mouth every 6 (six) hours as needed for cough. (Patient not taking: Reported on 12/25/2022)   Loratadine (CLARITIN PO), Take by mouth daily.  Current Outpatient Medications (Other):    Vitamin D, Ergocalciferol, (DRISDOL) 1.25 MG (50000 UNIT) CAPS capsule, Take 1 capsule (50,000 Units total) by mouth every 7 (seven) days.   ARIPiprazole (ABILIFY) 5 MG tablet, Take 1 tablet (5 mg total) by mouth daily.   ARIPiprazole (ABILIFY) 5 MG tablet, Take 1 tablet (5 mg total) by mouth daily.   Biotin 40347 MCG TABS, Take 1 tablet by mouth daily.   Blood Glucose Monitoring Suppl (FREESTYLE LITE) w/Device KIT, Use to check blood sugar 2 times daily   carbamazepine (TEGRETOL XR) 200 MG 12 hr tablet, Take 1 tablet (200 mg total) by mouth in the morning AND 2 tablets (400 mg total) at bedtime.   carbamazepine (TEGRETOL-XR) 200 MG 12 hr tablet, Take 1 tablet (200 mg total) by mouth in the morning and then take 2 tablets (400 mg total) by mouth at bedtime.   carbamazepine (TEGRETOL-XR) 200 MG 12 hr tablet, Take 1 tablet (200 mg total) by mouth every morning AND 2 tablets (400 mg total) at bedtime.   clobetasol ointment (TEMOVATE) 0.05 %, APPLY 1 APPLICATION ON TO THE SKIN 2 TIMES DAILY. DO NOT USE FOR MORE THAN 7 DAYS   cyclobenzaprine (FLEXERIL) 10 MG tablet, Take 1 tablet (10 mg total) by mouth at bedtime.   doxycycline (VIBRA-TABS) 100 MG tablet, Take 1 tablet (100 mg total) by mouth 2 (two) times daily.   gabapentin (NEURONTIN) 600 MG tablet, Take 1 tablet (600 mg total) by mouth 3 (three) times daily.   gabapentin (NEURONTIN) 800 MG tablet, Take 1 tablet (800 mg total) by mouth 3 (three)  times daily.   Glucosamine HCl (GLUCOSAMINE PO), Take 3 tablets by mouth daily.   glucose blood (FREESTYLE LITE) test strip, Use to check blood sugar 2 times daily   lamoTRIgine (LAMICTAL) 200 MG tablet, Take 1 tablet (200 mg total) by mouth at bedtime.   Lancets (FREESTYLE) lancets, Use as instructed to check sugar 2 times daily   Multiple Vitamin (MULTIVITAMIN) tablet, Take 1 tablet by mouth daily.   TEGRETOL-XR 200 MG 12 hr tablet, Take 1 tablet by mouth every morning and take 2 tablets at bedtime   TEGRETOL-XR 200 MG 12 hr tablet, Take 1 tablet (200 mg total) by mouth in the morning AND 2 tablets (400 mg total) at bedtime.   tiaGABine (GABITRIL) 4 MG tablet, Take 8 mg by mouth 2 (two) times daily.   tiaGABine (GABITRIL) 4 MG tablet, Take 1 tablet (4 mg total) by mouth in the morning AND 2 tablets (8 mg total) at bedtime. (Patient taking differently: Take 1 tablet (4 mg total) by mouth in the morning AND 1 tablets (4 mg total) at bedtime.)   tiaGABine (GABITRIL) 4 MG tablet, Take 1 tablet (4 mg total) by mouth every morning AND 2 tablets (8 mg total) at bedtime.   Turmeric (QC TUMERIC COMPLEX PO), Take by mouth daily.  Current Facility-Administered Medications (Other):    0.9 %  sodium chloride infusion   Reviewed prior external information including notes and imaging from  primary care provider As well as notes that were available from care everywhere and other healthcare systems.  Past medical history, social, surgical and family history all reviewed in electronic medical record.  No pertanent information unless stated regarding to the chief complaint.   Review of Systems:  No headache, visual changes, nausea, vomiting, diarrhea, constipation, dizziness, abdominal pain, skin rash, fevers, chills, night sweats, weight loss, swollen lymph nodes, body aches, joint swelling, chest  pain, shortness of breath, mood changes. POSITIVE muscle aches  Objective  Blood pressure (!) 136/90, pulse  81, height 5\' 8"  (1.727 m), weight 290 lb (131.5 kg), last menstrual period 01/25/2008, SpO2 95%.   General: No apparent distress alert and oriented x3 mood and affect normal, dressed appropriately.  HEENT: Pupils equal, extraocular movements intact  Respiratory: Patient's speak in full sentences and does not appear short of breath  Cardiovascular: No lower extremity edema, non tender, no erythema  Patient's legs on exam did not show any significant abnormality.  Seems to be neurovascularly intact.  Does have tightness noted in the paraspinal musculature of the lumbar spine.  Deep tendon reflexes though the Achilles and patella are intact.  No calf pain.    Left foot exam though does show swelling over the dorsal aspect of the foot mostly over the third and fourth metatarsal proximally.  Patient is tender to palpation in this area.  Positive squeeze test.  Patient was able to ambulate without any significant difficulty.   Impression and Recommendations:    The above documentation has been reviewed and is accurate and complete Judi Saa, DO

## 2023-01-26 ENCOUNTER — Other Ambulatory Visit: Payer: Self-pay

## 2023-01-28 DIAGNOSIS — F3132 Bipolar disorder, current episode depressed, moderate: Secondary | ICD-10-CM | POA: Diagnosis not present

## 2023-01-28 DIAGNOSIS — F411 Generalized anxiety disorder: Secondary | ICD-10-CM | POA: Diagnosis not present

## 2023-01-29 ENCOUNTER — Other Ambulatory Visit (HOSPITAL_BASED_OUTPATIENT_CLINIC_OR_DEPARTMENT_OTHER): Payer: Self-pay

## 2023-01-29 ENCOUNTER — Ambulatory Visit: Payer: Commercial Managed Care - PPO | Admitting: Internal Medicine

## 2023-01-29 MED ORDER — CARBAMAZEPINE ER 200 MG PO TB12
ORAL_TABLET | ORAL | 0 refills | Status: DC
  Filled 2023-01-29 – 2023-03-11 (×4): qty 270, 90d supply, fill #0

## 2023-01-29 MED ORDER — ARIPIPRAZOLE 5 MG PO TABS
5.0000 mg | ORAL_TABLET | Freq: Every day | ORAL | 2 refills | Status: DC
  Filled 2023-01-29 – 2023-03-25 (×2): qty 30, 30d supply, fill #0
  Filled 2023-04-20: qty 30, 30d supply, fill #1
  Filled 2023-05-18: qty 30, 30d supply, fill #2

## 2023-01-29 MED ORDER — TIAGABINE HCL 4 MG PO TABS
ORAL_TABLET | ORAL | 0 refills | Status: DC
  Filled 2023-01-29 – 2023-06-22 (×2): qty 270, 90d supply, fill #0

## 2023-01-30 ENCOUNTER — Other Ambulatory Visit (HOSPITAL_BASED_OUTPATIENT_CLINIC_OR_DEPARTMENT_OTHER): Payer: Self-pay

## 2023-01-30 ENCOUNTER — Ambulatory Visit: Payer: Commercial Managed Care - PPO | Admitting: Family Medicine

## 2023-01-30 VITALS — BP 136/90 | HR 81 | Ht 68.0 in | Wt 290.0 lb

## 2023-01-30 DIAGNOSIS — R252 Cramp and spasm: Secondary | ICD-10-CM

## 2023-01-30 DIAGNOSIS — M84375A Stress fracture, left foot, initial encounter for fracture: Secondary | ICD-10-CM | POA: Insufficient documentation

## 2023-01-30 MED ORDER — VITAMIN D (ERGOCALCIFEROL) 1.25 MG (50000 UNIT) PO CAPS
50000.0000 [IU] | ORAL_CAPSULE | ORAL | 0 refills | Status: DC
  Filled 2023-01-30: qty 12, 84d supply, fill #0

## 2023-01-30 NOTE — Assessment & Plan Note (Signed)
Third and fourth metatarsals with symptoms.  No cortical irregularity noted.  Patient does still wear a postop shoe or a rigid sole shoe.  Discussed icing regimen, vitamin D with prescription given.  Follow-up again in 6 to 8 weeks

## 2023-01-30 NOTE — Patient Instructions (Signed)
Talk to Dr. Vassie Loll and wear your CPAP more Take 500mg  Vit C with iron Vit D prescription Avoid being barefoot See you again in 4-5 weeks K2 daily for 1 month

## 2023-01-30 NOTE — Assessment & Plan Note (Signed)
Concerned that patient's leg cramps are from patient sleep apnea.  Patient has been noncompliant with her CPAP mask.  Patient has had gastric bypass surgery as well and may need to reevaluation of this.  Patient is going to follow-up with the pulmonologist in the near future.  Discussed with patient about icing regimen and home exercises otherwise.  Discussed avoiding certain activities.  Follow-up with me again in 6 to 8 weeks otherwise.

## 2023-02-01 ENCOUNTER — Encounter: Payer: Self-pay | Admitting: Family Medicine

## 2023-02-02 ENCOUNTER — Other Ambulatory Visit (HOSPITAL_BASED_OUTPATIENT_CLINIC_OR_DEPARTMENT_OTHER): Payer: Self-pay

## 2023-02-02 DIAGNOSIS — L723 Sebaceous cyst: Secondary | ICD-10-CM | POA: Diagnosis not present

## 2023-02-03 ENCOUNTER — Institutional Professional Consult (permissible substitution) (HOSPITAL_BASED_OUTPATIENT_CLINIC_OR_DEPARTMENT_OTHER): Payer: Commercial Managed Care - PPO | Admitting: Pulmonary Disease

## 2023-02-19 ENCOUNTER — Ambulatory Visit: Payer: Commercial Managed Care - PPO

## 2023-02-19 DIAGNOSIS — H52203 Unspecified astigmatism, bilateral: Secondary | ICD-10-CM | POA: Diagnosis not present

## 2023-02-19 LAB — HM DIABETES EYE EXAM

## 2023-02-20 ENCOUNTER — Ambulatory Visit (INDEPENDENT_AMBULATORY_CARE_PROVIDER_SITE_OTHER): Payer: Commercial Managed Care - PPO | Admitting: *Deleted

## 2023-02-20 DIAGNOSIS — Z23 Encounter for immunization: Secondary | ICD-10-CM | POA: Diagnosis not present

## 2023-02-23 ENCOUNTER — Other Ambulatory Visit: Payer: Self-pay

## 2023-02-24 ENCOUNTER — Other Ambulatory Visit: Payer: Self-pay

## 2023-02-24 ENCOUNTER — Other Ambulatory Visit (HOSPITAL_BASED_OUTPATIENT_CLINIC_OR_DEPARTMENT_OTHER): Payer: Self-pay

## 2023-02-24 ENCOUNTER — Other Ambulatory Visit: Payer: Self-pay | Admitting: Cardiovascular Disease

## 2023-02-24 MED ORDER — SPIRONOLACTONE 25 MG PO TABS
25.0000 mg | ORAL_TABLET | Freq: Every day | ORAL | 1 refills | Status: DC
  Filled 2023-02-24: qty 90, 90d supply, fill #0
  Filled 2023-05-25: qty 90, 90d supply, fill #1

## 2023-02-25 ENCOUNTER — Other Ambulatory Visit: Payer: Self-pay

## 2023-02-26 NOTE — Progress Notes (Signed)
[name] [address] [place] [address] Phone: [phone] Subjective:   INadine Counts, am serving as a scribe for Dr. Antoine Primas.  I'm seeing this patient by the request  of:  Sheliah Hatch, MD  CC: Left foot pain  [id number]  01/30/2023 Third and fourth metatarsals with symptoms. No cortical irregularity noted. Patient does still wear a postop shoe or a rigid sole shoe. Discussed icing regimen, vitamin D with prescription given. Follow-up again in 6 to 8 weeks  Concerned that patient's leg cramps are from patient sleep apnea.  Patient has been noncompliant with her CPAP mask.  Patient has had gastric bypass surgery as well and may need to reevaluation of this.  Patient is going to follow-up with the pulmonologist in the near future.  Discussed with patient about icing regimen and home exercises otherwise.  Discussed avoiding certain activities.  Follow-up with me again in 6 to 8 weeks otherwise.     Updated 02/27/2023 [name] is a 52 y.o. female coming in with complaint of leg pain. Foot is getting better. Doesn't feel like its 100%. Wear boot all the time. Was waiting to get back from cruise to start wearing cpap machine.  Patient was on a cruise and doing a significant amount of walking.       Past Medical History:  Diagnosis Date   Allergy    seasonal allergies   Anemia    hx of   Anxiety    on meds   Asthma    hx of   Bipolar disorder (HCC)    Cataract    bilateral sx   Chicken pox    Complication of anesthesia    oxygen saturation dropped after hysterectomy 2009 at [facility]   Depression    on meds   Diabetes mellitus    on meds   Dyspnea    GERD (gastroesophageal reflux disease)    hx of   Hyperlipidemia    on meds   Hypertension    readings   Hypothyroidism    hx of-LEFT thyroid removed   Irregular heartbeat    saw dr Myrtis Ser sees -cardiology as needed   Migraine    Sleep apnea    uses cpap    Past Surgical History:  Procedure Laterality Date   ANKLE SURGERY Left 1989   BACK SURGERY  1999   CATARACT EXTRACTION, BILATERAL     CESAREAN SECTION     1995   LAPAROSCOPIC ASSISTED VAGINAL HYSTERECTOMY  2009     BSO fibroids, DUB, pelvic pain   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 REPAIR N/A 04/14/2016   Procedure: LAPAROSCOPIC ROUX-EN-Y GASTRIC BYPASS  WITH UPPER ENDOSCOPY;  Surgeon: Glenna Fellows, MD;  Location: [facility];  Service: General;  Laterality: N/A;   SHOULDER ARTHROSCOPY WITH OPEN ROTATOR CUFF REPAIR Right 01/02/2017   Procedure: Right shoulder arthroscopy, A-subcromial decompression, mini open rotator cuff repair, open distal clavicle resection, biceps tenodesis;  Surgeon: Beverely Low, MD;  Location: [facility] OR;  Service: Orthopedics;  Laterality: Right;   THYROIDECTOMY, PARTIAL Left 2001   UMBILICAL HERNIA REPAIR  2003   WISDOM TOOTH EXTRACTION     Social History   Socioeconomic History   Marital status: Married    Spouse name: Not on file   Number of children: 1   Years of education: Not on file   Highest education level: Associate degree: academic program  Occupational History   Occupation: nursing  Tobacco Use   Smoking status: Never   Smokeless tobacco: Never  Vaping Use   Vaping status: Never Used  Substance and Sexual Activity   Alcohol use: No    Alcohol/week: 0.0 standard drinks of alcohol   Drug use: No   Sexual activity: Yes    Partners: Male  Other Topics Concern   Not on file  Social History Narrative   Not on file   Social Determinants of Health   Financial Resource Strain: Low Risk  (12/24/2022)   Overall Financial Resource Strain (CARDIA)    Difficulty of Paying Living Expenses: Not hard at all  Food Insecurity: No Food Insecurity (12/24/2022)   Hunger Vital Sign    Worried About Running Out of Food in the Last Year: Never true    Ran Out of Food in the Last Year: Never true  Transportation Needs: No  Transportation Needs (12/24/2022)   PRAPARE - Administrator, Civil Service (Medical): No    Lack of Transportation (Non-Medical): No  Physical Activity: Unknown (12/24/2022)   Exercise Vital Sign    Days of Exercise per Week: 0 days    Minutes of Exercise per Session: Not on file  Stress: No Stress Concern Present (12/24/2022)   [employer] of Occupational Health - Occupational Stress Questionnaire    Feeling of Stress : Not at all  Social Connections: Moderately Isolated (12/24/2022)   Social Connection and Isolation Panel [NHANES]    Frequency of Communication with Friends and Family: Twice a week    Frequency of Social Gatherings with Friends and Family: Once a week    Attends Religious Services: Never    Database administrator or Organizations: No    Attends Engineer, structural: Not on file    Marital Status: Married   Allergies  Allergen Reactions   Zofran [Ondansetron] Swelling   Family History  Problem Relation Age of Onset   Hyperlipidemia Mother    Diabetes Mother    Anxiety disorder Mother    Heart disease Mother    Hypertension Father    Lupus Father    Heart disease Father    Asthma Father    Colon polyps Father 78   Stroke Maternal Aunt    Seizures Paternal Grandfather    Stroke Paternal Grandfather    Mental illness Paternal Grandfather    Breast cancer Neg Hx    Colon cancer Neg Hx    Esophageal cancer Neg Hx    Stomach cancer Neg Hx    Rectal cancer Neg Hx     Current Outpatient Medications (Endocrine & Metabolic):    Dulaglutide (TRULICITY) 3 MG/0.5ML SOPN, Inject 3 mg into the skin once a week.   metFORMIN (GLUCOPHAGE) 1000 MG tablet, Take 1 tablet (1,000 mg total) by mouth 2 (two) times daily with a meal.   Current Outpatient Medications (Cardiovascular):    atorvastatin (LIPITOR) 40 MG tablet, Take 1 tablet (40 mg total) by mouth daily.   lisinopril (ZESTRIL) 40 MG tablet, Take 1 tablet (40 mg total) by mouth daily.    spironolactone (ALDACTONE) 25 MG tablet, Take 1 tablet (25 mg total) by mouth daily.   Current Outpatient Medications (Respiratory):    guaiFENesin-codeine 100-10 MG/5ML syrup, Take 5-10 mLs by mouth every 6 (six) hours as needed for cough. (Patient not taking: Reported on 12/25/2022)   Loratadine (CLARITIN PO), Take by mouth daily.       Current Outpatient Medications (Other):    ARIPiprazole (ABILIFY)  5 MG tablet, Take 1 tablet (5 mg total) by mouth daily.   ARIPiprazole (ABILIFY) 5 MG tablet, Take 1 tablet (5 mg total) by mouth daily.   Biotin 16109 MCG TABS, Take 1 tablet by mouth daily.   Blood Glucose Monitoring Suppl (FREESTYLE LITE) w/Device KIT, Use to check blood sugar 2 times daily   carbamazepine (TEGRETOL XR) 200 MG 12 hr tablet, Take 1 tablet (200 mg total) by mouth in the morning AND 2 tablets (400 mg total) at bedtime.   carbamazepine (TEGRETOL-XR) 200 MG 12 hr tablet, Take 1 tablet (200 mg total) by mouth in the morning and then take 2 tablets (400 mg total) by mouth at bedtime.   carbamazepine (TEGRETOL-XR) 200 MG 12 hr tablet, Take 1 tablet (200 mg total) by mouth every morning AND 2 tablets (400 mg total) at bedtime.   clobetasol ointment (TEMOVATE) 0.05 %, APPLY 1 APPLICATION ON TO THE SKIN 2 TIMES DAILY. DO NOT USE FOR MORE THAN 7 DAYS   cyclobenzaprine (FLEXERIL) 10 MG tablet, Take 1 tablet (10 mg total) by mouth at bedtime.   doxycycline (VIBRA-TABS) 100 MG tablet, Take 1 tablet (100 mg total) by mouth 2 (two) times daily.   gabapentin (NEURONTIN) 600 MG tablet, Take 1 tablet (600 mg total) by mouth 3 (three) times daily.   gabapentin (NEURONTIN) 800 MG tablet, Take 1 tablet (800 mg total) by mouth 3 (three) times daily.   Glucosamine HCl (GLUCOSAMINE PO), Take 3 tablets by mouth daily.   glucose blood (FREESTYLE LITE) test strip, Use to check blood sugar 2 times daily   lamoTRIgine (LAMICTAL) 200 MG tablet, Take 1 tablet (200 mg total) by mouth at bedtime.   Lancets  (FREESTYLE) lancets, Use as instructed to check sugar 2 times daily   Multiple Vitamin (MULTIVITAMIN) tablet, Take 1 tablet by mouth daily.   TEGRETOL-XR 200 MG 12 hr tablet, Take 1 tablet by mouth every morning and take 2 tablets at bedtime   TEGRETOL-XR 200 MG 12 hr tablet, Take 1 tablet (200 mg total) by mouth in the morning AND 2 tablets (400 mg total) at bedtime.   tiaGABine (GABITRIL) 4 MG tablet, Take 8 mg by mouth 2 (two) times daily.   tiaGABine (GABITRIL) 4 MG tablet, Take 1 tablet (4 mg total) by mouth in the morning AND 2 tablets (8 mg total) at bedtime. (Patient taking differently: Take 1 tablet (4 mg total) by mouth in the morning AND 1 tablets (4 mg total) at bedtime.)   tiaGABine (GABITRIL) 4 MG tablet, Take 1 tablet (4 mg total) by mouth every morning AND 2 tablets (8 mg total) at bedtime.   Turmeric (QC TUMERIC COMPLEX PO), Take by mouth daily.   Vitamin D, Ergocalciferol, (DRISDOL) 1.25 MG (50000 UNIT) CAPS capsule, Take 1 capsule (50,000 Units total) by mouth every 7 (seven) days.  Current Facility-Administered Medications (Other):    0.9 %  sodium chloride infusion   Reviewed prior external information including notes and imaging from  primary care provider As well as notes that were available from care everywhere and other healthcare systems.  Past medical history, social, surgical and family history all reviewed in electronic medical record.  No pertanent information unless stated regarding to the chief complaint.   Review of Systems:  No headache, visual changes, nausea, vomiting, diarrhea, constipation, dizziness, abdominal pain, skin rash, fevers, chills, night sweats, weight loss, swollen lymph nodes, body aches, joint swelling, chest pain, shortness of breath, mood changes. POSITIVE muscle aches  Objective  Blood pressure (!) 124/90, pulse 80, height 5\' 8"  (1.727 m), weight 289 lb (131.1 kg), last menstrual period 01/25/2008, SpO2 96%.   General: No apparent  distress alert and oriented x3 mood and affect normal, dressed appropriately.  HEENT: Pupils equal, extraocular movements intact  Respiratory: Patient's speak in full sentences and does not appear short of breath  Cardiovascular: No lower extremity edema, non tender, no erythema  Antalgic gait noted.  Continues to have some difficulty with walking.  Patient is still minorly tender at the dorsal aspect of the foot.  Patient does not have any significant swelling though noted.  Limited muscular skeletal ultrasound was performed and interpreted by Antoine Primas, M   Limited ultrasound still shows the patient does have hypoechoic changes in this area and a cortical irregularity.  Posttraumatic findings noted at this time.    Impression and Recommendations:     The above documentation has been reviewed and is accurate and complete Judi Saa, DO

## 2023-02-27 ENCOUNTER — Other Ambulatory Visit: Payer: Self-pay

## 2023-02-27 ENCOUNTER — Ambulatory Visit: Payer: Commercial Managed Care - PPO | Admitting: Family Medicine

## 2023-02-27 ENCOUNTER — Encounter: Payer: Self-pay | Admitting: Family Medicine

## 2023-02-27 VITALS — BP 124/90 | HR 80 | Ht 68.0 in | Wt 289.0 lb

## 2023-02-27 DIAGNOSIS — M2550 Pain in unspecified joint: Secondary | ICD-10-CM | POA: Diagnosis not present

## 2023-02-27 DIAGNOSIS — M84375A Stress fracture, left foot, initial encounter for fracture: Secondary | ICD-10-CM

## 2023-02-27 LAB — CBC WITH DIFFERENTIAL/PLATELET
Basophils Absolute: 0.1 10*3/uL (ref 0.0–0.1)
Basophils Relative: 1 % (ref 0.0–3.0)
Eosinophils Absolute: 0.6 10*3/uL (ref 0.0–0.7)
Eosinophils Relative: 6.6 % — ABNORMAL HIGH (ref 0.0–5.0)
HCT: 38.9 % (ref 36.0–46.0)
Hemoglobin: 12.8 g/dL (ref 12.0–15.0)
Lymphocytes Relative: 29.4 % (ref 12.0–46.0)
Lymphs Abs: 2.6 10*3/uL (ref 0.7–4.0)
MCHC: 33 g/dL (ref 30.0–36.0)
MCV: 89.6 fL (ref 78.0–100.0)
Monocytes Absolute: 0.9 10*3/uL (ref 0.1–1.0)
Monocytes Relative: 10.3 % (ref 3.0–12.0)
Neutro Abs: 4.7 10*3/uL (ref 1.4–7.7)
Neutrophils Relative %: 52.7 % (ref 43.0–77.0)
Platelets: 372 10*3/uL (ref 150.0–400.0)
RBC: 4.34 Mil/uL (ref 3.87–5.11)
RDW: 13 % (ref 11.5–15.5)
WBC: 8.9 10*3/uL (ref 4.0–10.5)

## 2023-02-27 LAB — IBC PANEL
Iron: 65 ug/dL (ref 42–145)
Saturation Ratios: 18.5 % — ABNORMAL LOW (ref 20.0–50.0)
TIBC: 351.4 ug/dL (ref 250.0–450.0)
Transferrin: 251 mg/dL (ref 212.0–360.0)

## 2023-02-27 LAB — VITAMIN D 25 HYDROXY (VIT D DEFICIENCY, FRACTURES): VITD: 59.82 ng/mL (ref 30.00–100.00)

## 2023-02-27 LAB — FERRITIN: Ferritin: 23.2 ng/mL (ref 10.0–291.0)

## 2023-02-27 NOTE — Assessment & Plan Note (Signed)
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  Patient will continue with the rigid soled shoe or add a carbon fiber plate to her regular sneakers at this time.  Discussed recovery sandals, continue the vitamin D supplementation.  Recheck iron to make sure patient continues to do well with supplementation.  I do feel that the leg cramps still need to see how patient responds to using the CPAP regularly with patient being noncompliant previously.  Follow-up with me again in 4 to 6 weeks

## 2023-02-27 NOTE — Patient Instructions (Addendum)
Carbon fiber plate Rigid sole shoes Oofos recovery sandals in the house Continue all the vitamins Labs today See you again in 5-6 weeks

## 2023-03-02 ENCOUNTER — Encounter: Payer: Self-pay | Admitting: Family Medicine

## 2023-03-02 ENCOUNTER — Other Ambulatory Visit: Payer: Self-pay | Admitting: Internal Medicine

## 2023-03-02 ENCOUNTER — Other Ambulatory Visit (HOSPITAL_BASED_OUTPATIENT_CLINIC_OR_DEPARTMENT_OTHER): Payer: Self-pay

## 2023-03-02 ENCOUNTER — Other Ambulatory Visit: Payer: Self-pay | Admitting: Family Medicine

## 2023-03-02 MED ORDER — TRULICITY 3 MG/0.5ML ~~LOC~~ SOAJ
3.0000 mg | SUBCUTANEOUS | 3 refills | Status: DC
Start: 2023-03-02 — End: 2023-12-08
  Filled 2023-05-25: qty 2, 28d supply, fill #0
  Filled 2023-06-17: qty 2, 28d supply, fill #1
  Filled 2023-08-06: qty 2, 28d supply, fill #2
  Filled 2023-09-28: qty 2, 28d supply, fill #3
  Filled 2023-11-30: qty 2, 28d supply, fill #4

## 2023-03-04 ENCOUNTER — Other Ambulatory Visit (HOSPITAL_BASED_OUTPATIENT_CLINIC_OR_DEPARTMENT_OTHER): Payer: Self-pay

## 2023-03-04 ENCOUNTER — Other Ambulatory Visit: Payer: Self-pay

## 2023-03-04 ENCOUNTER — Other Ambulatory Visit: Payer: Self-pay | Admitting: Internal Medicine

## 2023-03-04 DIAGNOSIS — E119 Type 2 diabetes mellitus without complications: Secondary | ICD-10-CM

## 2023-03-04 MED ORDER — METFORMIN HCL 1000 MG PO TABS
1000.0000 mg | ORAL_TABLET | Freq: Two times a day (BID) | ORAL | 3 refills | Status: DC
  Filled 2023-03-04: qty 180, 90d supply, fill #0
  Filled 2023-06-02: qty 180, 90d supply, fill #1
  Filled 2023-08-31: qty 180, 90d supply, fill #2
  Filled 2023-11-27: qty 180, 90d supply, fill #3

## 2023-03-05 ENCOUNTER — Encounter: Payer: Self-pay | Admitting: Family Medicine

## 2023-03-05 ENCOUNTER — Ambulatory Visit (INDEPENDENT_AMBULATORY_CARE_PROVIDER_SITE_OTHER): Payer: Commercial Managed Care - PPO | Admitting: Family Medicine

## 2023-03-05 ENCOUNTER — Ambulatory Visit: Payer: Commercial Managed Care - PPO | Admitting: Internal Medicine

## 2023-03-05 VITALS — BP 128/80 | HR 78 | Temp 97.8°F | Ht 68.0 in | Wt 286.0 lb

## 2023-03-05 DIAGNOSIS — Z0000 Encounter for general adult medical examination without abnormal findings: Secondary | ICD-10-CM

## 2023-03-05 DIAGNOSIS — E119 Type 2 diabetes mellitus without complications: Secondary | ICD-10-CM

## 2023-03-05 LAB — LIPID PANEL
Cholesterol: 172 mg/dL (ref 0–200)
HDL: 58.6 mg/dL (ref >39.00)
LDL Cholesterol: 88 mg/dL (ref 0–99)
NonHDL: 113.45
Total CHOL/HDL Ratio: 3
Triglycerides: 127 mg/dL (ref 0.0–149.0)
VLDL: 25.4 mg/dL (ref 0.0–40.0)

## 2023-03-05 LAB — CBC WITH DIFFERENTIAL/PLATELET
Basophils Absolute: 0.1 10*3/uL (ref 0.0–0.1)
Basophils Relative: 1.2 % (ref 0.0–3.0)
Eosinophils Absolute: 0.4 10*3/uL (ref 0.0–0.7)
Eosinophils Relative: 4.5 % (ref 0.0–5.0)
HCT: 39.8 % (ref 36.0–46.0)
Hemoglobin: 13 g/dL (ref 12.0–15.0)
Lymphocytes Relative: 24.3 % (ref 12.0–46.0)
Lymphs Abs: 1.9 10*3/uL (ref 0.7–4.0)
MCHC: 32.6 g/dL (ref 30.0–36.0)
MCV: 90.2 fL (ref 78.0–100.0)
Monocytes Absolute: 0.7 10*3/uL (ref 0.1–1.0)
Monocytes Relative: 8.3 % (ref 3.0–12.0)
Neutro Abs: 4.8 10*3/uL (ref 1.4–7.7)
Neutrophils Relative %: 61.7 % (ref 43.0–77.0)
Platelets: 348 10*3/uL (ref 150.0–400.0)
RBC: 4.41 Mil/uL (ref 3.87–5.11)
RDW: 12.8 % (ref 11.5–15.5)
WBC: 7.8 10*3/uL (ref 4.0–10.5)

## 2023-03-05 LAB — HEPATIC FUNCTION PANEL
ALT: 20 U/L (ref 0–35)
AST: 18 U/L (ref 0–37)
Albumin: 4.1 g/dL (ref 3.5–5.2)
Alkaline Phosphatase: 108 U/L (ref 39–117)
Bilirubin, Direct: 0.1 mg/dL (ref 0.0–0.3)
Total Bilirubin: 0.3 mg/dL (ref 0.2–1.2)
Total Protein: 6.3 g/dL (ref 6.0–8.3)

## 2023-03-05 LAB — BASIC METABOLIC PANEL
BUN: 12 mg/dL (ref 6–23)
CO2: 29 meq/L (ref 19–32)
Calcium: 9.6 mg/dL (ref 8.4–10.5)
Chloride: 93 meq/L — ABNORMAL LOW (ref 96–112)
Creatinine, Ser: 0.48 mg/dL (ref 0.40–1.20)
GFR: 109.76 mL/min (ref >60.00)
Glucose, Bld: 109 mg/dL — ABNORMAL HIGH (ref 70–99)
Potassium: 5.1 meq/L (ref 3.5–5.1)
Sodium: 130 meq/L — ABNORMAL LOW (ref 135–145)

## 2023-03-05 LAB — HEMOGLOBIN A1C: Hgb A1c MFr Bld: 7 % — ABNORMAL HIGH (ref 4.6–6.5)

## 2023-03-05 LAB — TSH: TSH: 1.27 u[IU]/mL (ref 0.35–5.50)

## 2023-03-05 LAB — MICROALBUMIN / CREATININE URINE RATIO
Creatinine,U: 58.2 mg/dL
Microalb Creat Ratio: 2.7 mg/g (ref 0.0–30.0)
Microalb, Ur: 1.6 mg/dL (ref 0.0–1.9)

## 2023-03-05 LAB — VITAMIN D 25 HYDROXY (VIT D DEFICIENCY, FRACTURES): VITD: 47.34 ng/mL (ref 30.00–100.00)

## 2023-03-05 NOTE — Telephone Encounter (Signed)
Pt wanted you to be aware Atorvastatin changed to every other day, attempt to try and stop some cramps in legs

## 2023-03-05 NOTE — Patient Instructions (Signed)
Follow up in 6 months to recheck BP and cholesterol We'll notify you of your lab results and make any changes if needed Continue to work on healthy diet and regular exercise- you're doing great! Call with any questions or concerns Stay Safe!  Stay Healthy! Happy Fall!!! 

## 2023-03-05 NOTE — Assessment & Plan Note (Signed)
Pt's PE WNL w/ exception of BMI.  UTD on mammo, colonoscopy, Tdap, flu, eye exam, foot exam.  Check labs.  Anticipatory guidance provided.

## 2023-03-05 NOTE — Progress Notes (Signed)
Subjective:    Patient ID: [name], female    DOB: 08-Jan-1971, 52 y.o.   MRN: [id number]  HPI CPE- UTD on mammo, eye exam, foot exam, colonoscopy, flu, Tdap  Patient Care Team    Relationship Specialty Notifications Start End  Sheliah Hatch, MD PCP - General Family Medicine  02/20/23   O'Neal, Ronnald Ramp, MD PCP - Cardiology Cardiology  12/05/20   Jerene Bears, MD Consulting Physician Gynecology  07/26/15   Carlus Pavlov, MD Consulting Physician Internal Medicine  07/26/15   Deatra Robinson, NP  Nurse Practitioner  07/19/20   Drema Dallas, DO Consulting Physician Neurology  07/23/22      Health Maintenance  Topic Date Due   Diabetic kidney evaluation - Urine ACR  02/28/2023   HEMOGLOBIN A1C  03/21/2023   Zoster Vaccines- Shingrix (2 of 2) 04/17/2023   MAMMOGRAM  06/06/2023   Diabetic kidney evaluation - eGFR measurement  09/01/2023   FOOT EXAM  09/19/2023   OPHTHALMOLOGY EXAM  02/19/2024   DTaP/Tdap/Td (3 - Td or Tdap) 02/20/2028   Colonoscopy  04/26/2030   INFLUENZA VACCINE  Completed   Hepatitis C Screening  Completed   HIV Screening  Completed   HPV VACCINES  Aged Out   COVID-19 Vaccine  Discontinued      Review of Systems Patient reports no vision/ hearing changes, adenopathy,fever, weight change,  persistant/recurrent hoarseness , swallowing issues, chest pain, palpitations, edema, persistant/recurrent cough, hemoptysis, dyspnea (rest/exertional/paroxysmal nocturnal), gastrointestinal bleeding (melena, rectal bleeding), abdominal pain, significant heartburn, bowel changes, GU symptoms (dysuria, hematuria, incontinence), Gyn symptoms (abnormal  bleeding, pain),  syncope, focal weakness, memory loss, numbness & tingling, skin/hair/nail changes, abnormal bruising or bleeding, anxiety, or depression.     Objective:   Physical Exam General Appearance:    Alert, cooperative, no distress, appears stated age  Head:    Normocephalic, without obvious  abnormality, atraumatic  Eyes:    PERRL, conjunctiva/corneas clear, EOM's intact both eyes  Ears:    Normal TM's and external ear canals, both ears  Nose:   Nares normal, septum midline, mucosa normal, no drainage    or sinus tenderness  Throat:   Lips, mucosa, and tongue normal; teeth and gums normal  Neck:   Supple, symmetrical, trachea midline, no adenopathy;    Thyroid: no enlargement/tenderness/nodules  Back:     Symmetric, no curvature, ROM normal, no CVA tenderness  Lungs:     Clear to auscultation bilaterally, respirations unlabored  Chest Wall:    No tenderness or deformity   Heart:    Regular rate and rhythm, S1 and S2 normal, no murmur, rub   or gallop  Breast Exam:    Deferred to mammo  Abdomen:     Soft, non-tender, bowel sounds active all four quadrants,    no masses, no organomegaly  Genitalia:    Deferred  Rectal:    Extremities:   Extremities normal, atraumatic, no cyanosis or edema  Pulses:   2+ and symmetric all extremities  Skin:   Skin color, texture, turgor normal, no rashes or lesions  Lymph nodes:   Cervical, supraclavicular, and axillary nodes normal  Neurologic:   CNII-XII intact, normal strength, sensation and reflexes    throughout          Assessment & Plan:

## 2023-03-05 NOTE — Assessment & Plan Note (Signed)
Due for microalbumin- ordered.

## 2023-03-06 ENCOUNTER — Telehealth: Payer: Self-pay

## 2023-03-06 NOTE — Telephone Encounter (Signed)
Lvm for patient to return call about lab results, let her know she could also check them on my chart.

## 2023-03-06 NOTE — Telephone Encounter (Signed)
-----   Message from Neena Rhymes sent at 03/06/2023  3:06 PM EDT ----- A1C looks MUCH better!  Keep up the good work!  Sodium is again low but stable  Remainder of labs look great!

## 2023-03-06 NOTE — Telephone Encounter (Signed)
Patient returned my call, she is aware of her lab results

## 2023-03-11 ENCOUNTER — Other Ambulatory Visit: Payer: Self-pay

## 2023-03-11 ENCOUNTER — Other Ambulatory Visit (HOSPITAL_BASED_OUTPATIENT_CLINIC_OR_DEPARTMENT_OTHER): Payer: Self-pay

## 2023-03-12 ENCOUNTER — Ambulatory Visit: Payer: Commercial Managed Care - PPO | Admitting: Internal Medicine

## 2023-03-12 ENCOUNTER — Encounter: Payer: Self-pay | Admitting: Internal Medicine

## 2023-03-12 ENCOUNTER — Other Ambulatory Visit (HOSPITAL_BASED_OUTPATIENT_CLINIC_OR_DEPARTMENT_OTHER): Payer: Self-pay

## 2023-03-12 VITALS — BP 122/70 | HR 70 | Ht 68.0 in | Wt 290.4 lb

## 2023-03-12 DIAGNOSIS — E119 Type 2 diabetes mellitus without complications: Secondary | ICD-10-CM

## 2023-03-12 DIAGNOSIS — E782 Mixed hyperlipidemia: Secondary | ICD-10-CM | POA: Diagnosis not present

## 2023-03-12 DIAGNOSIS — Z7984 Long term (current) use of oral hypoglycemic drugs: Secondary | ICD-10-CM | POA: Diagnosis not present

## 2023-03-12 DIAGNOSIS — Z7985 Long-term (current) use of injectable non-insulin antidiabetic drugs: Secondary | ICD-10-CM | POA: Diagnosis not present

## 2023-03-12 MED ORDER — DAPAGLIFLOZIN PROPANEDIOL 5 MG PO TABS
5.0000 mg | ORAL_TABLET | Freq: Every day | ORAL | 3 refills | Status: DC
  Filled 2023-03-12: qty 90, 90d supply, fill #0

## 2023-03-12 NOTE — Patient Instructions (Addendum)
Please continue: - Metformin 1000 mg 2x a day - Trulicity 3 mg weekly  Please try to start: - Farxiga 5 mg before b'fast  Please come back for a follow-up appointment in 3-4 months.

## 2023-03-12 NOTE — Progress Notes (Signed)
Patient ID: [name], female   DOB: 13-Dec-1970, 52 y.o.   MRN: [id number]  HPI: [name] is a 52 y.o.-year-old female, presenting for f/u for DM2, dx in 2001, non-insulin-dependent, uncontrolled, with diabetic retinopathy. Last visit 6 months ago.  Interim history: No increased urination, blurry vision, nausea, chest pain.   She had COVID-19 again in 11/2022. She had a stress fracture in the left 4th metatarsal - in a boot. She started to take lunch from home, cut down on carbs at night.  Reviewed HbA1c levels: Lab Results  Component Value Date   HGBA1C 7.0 (H) 03/05/2023   HGBA1C 9.1 (A) 09/19/2022   HGBA1C 8.1 (A) 02/27/2022   HGBA1C 8.4 (A) 10/01/2021   HGBA1C 9.7 (A) 06/13/2021   HGBA1C 6.4 (A) 12/11/2020   HGBA1C 6.6 (A) 08/16/2020   HGBA1C 8.1 (A) 12/15/2019   HGBA1C 7.6 (H) 07/12/2019   HGBA1C 6.7 (A) 11/30/2018   HGBA1C 8.5 (A) 07/26/2018   HGBA1C 9.9 (H) 04/15/2018   HGBA1C 7.6 08/03/2017   HGBA1C 6.6 (H) 12/30/2016   HGBA1C 6.1 (H) 10/09/2016   HGBA1C 9.6 02/21/2016   HGBA1C 9.2 10/05/2015   HGBA1C 10.4 05/31/2015   HGBA1C 8.8 01/09/2015   HGBA1C 11.0 (H) 06/21/2014   Pt was on a regimen of: - Metformin 2000 mg at dinnertime - Lantus 63 units at bedtime - Victoza 1.8 mg daily - Amaryl 4 mg 2x a day She had frequent yeast inf when sugars were higher before.   Pt is now on a regimen of: - Metformin 500 mg 2x a day >> 1000 mg with dinner >> 1000 mg 2x a day - Trulicity -initially had nausea, then resolved: 1.5 >> 3 mg weekly >> restarted 08/2022  She is checking sugars 0-1x a day: - am: 120-130, occas. 150 pizza >> 138-196, 216 >> 176-276 >> 150-160 - 2h after b'fast: 83-99 >> n/c >> 105-161 >> 177, 240 >> n/c - before lunch: 76-102 >> 125-130s >> 96-130 >> 202-267 >> n/c - 2h after lunch: 80-94 >> 130 >> 111-157 >> 270 >> up to 240 - before dinner: 95, 99 >> 85-111 >> n/c >> 192 >> 164, 202 >> 130-140 - 2h after dinner: 8100-120 >> n/c  >>  142, 160 >> 276  >> 180-200 - bedtime:  1 118-160 >> 91-133 >> 150s, occas. 200 >> n/c - nighttime: n/c Lowest sugar was 34 (after Sx - while still on insulin) >> ... 96 > 164 >> 130; she has hypoglycemia awareness in the 90s. Highest sugar was 330 >> ... 276 >> 240.  Glucometer: One Touch Ultra 2 >> FreeStyle Lite   -No CKD, last BUN/creatinine:  Lab Results  Component Value Date   BUN 12 03/05/2023   CREATININE 0.48 03/05/2023   Lab Results  Component Value Date   MICRALBCREAT 2.7 03/05/2023   MICRALBCREAT 3.5 02/27/2022  On lisinopril 20.  -+ HL; last set of lipids: Lab Results  Component Value Date   CHOL 172 03/05/2023   HDL 58.60 03/05/2023   LDLCALC 88 03/05/2023   LDLDIRECT 112.0 06/21/2014   TRIG 127.0 03/05/2023   CHOLHDL 3 03/05/2023  On Lipitor 40.  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  She had cataracts but is status post cataract surgery.  Dr. Emily Filbert.  She sees a retina specialist.  -No numbness and tingling in her feet.  Last foot exam 08/2022. She sees Dr. Everlena Cooper 2/2 cramps in thighs. On  Flexeril.  She had Roux-en-Y gastric bypass surgery in 03/2016.  After this, she lost more than 100 pounds but she gained more than 50 pounds back afterwards. She had a syncopal episode in [place] in 09/2020, after staying in the sun for 3 hours.  At that time, she went to the emergency room.  She was given IV fluids.  EKG was normal.  HCTZ was stopped and lisinopril was increased.  Next day, she tested positive for COVID.  This was complicated by bronchitis > had ABx. She had a previous syncopal episode in [place] in 03/2020.  ROS: + see HPI  I reviewed pt's medications, allergies, PMH, social hx, family hx, and changes were documented in the history of present illness. Otherwise, unchanged from my initial visit note.  Past Medical History:  Diagnosis Date   Allergy    seasonal allergies   Anemia    hx of   Anxiety    on meds    Asthma    hx of   Bipolar disorder (HCC)    Cataract    bilateral sx   Chicken pox    Complication of anesthesia    oxygen saturation dropped after hysterectomy 2009 at [facility]   Depression    on meds   Diabetes mellitus    on meds   Dyspnea    GERD (gastroesophageal reflux disease)    hx of   Hyperlipidemia    on meds   Hypertension    readings   Hypothyroidism    hx of-LEFT thyroid removed   Irregular heartbeat    saw dr Myrtis Ser sees -cardiology as needed   Migraine    Sleep apnea    uses cpap   Past Surgical History:  Procedure Laterality Date   ANKLE SURGERY Left 1989   BACK SURGERY  1999   CATARACT EXTRACTION, BILATERAL     CESAREAN SECTION     1995   LAPAROSCOPIC ASSISTED VAGINAL HYSTERECTOMY  2009     BSO fibroids, DUB, pelvic pain   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 REPAIR N/A 04/14/2016   Procedure: LAPAROSCOPIC ROUX-EN-Y GASTRIC BYPASS  WITH UPPER ENDOSCOPY;  Surgeon: Glenna Fellows, MD;  Location: [facility] ORS;  Service: General;  Laterality: N/A;   SHOULDER ARTHROSCOPY WITH OPEN ROTATOR CUFF REPAIR Right 01/02/2017   Procedure: Right shoulder arthroscopy, A-subcromial decompression, mini open rotator cuff repair, open distal clavicle resection, biceps tenodesis;  Surgeon: Beverely Low, MD;  Location: [facility];  Service: Orthopedics;  Laterality: Right;   THYROIDECTOMY, PARTIAL Left 2001   UMBILICAL HERNIA REPAIR  2003   WISDOM TOOTH EXTRACTION     Social History   Social History   Marital Status: Married    Spouse Name: N/A   Number of Children: 1   Occupational History   Passenger transport manager, Administrator   Social History Main Topics   Smoking status: Never Smoker    Smokeless tobacco: Never Used   Alcohol Use: No   Drug Use: No   Current Outpatient Medications on File Prior to Visit  Medication Sig Dispense Refill   ARIPiprazole (ABILIFY) 5 MG tablet Take 1 tablet (5 mg total) by mouth daily. 30 tablet 2   ARIPiprazole (ABILIFY) 5 MG  tablet Take 1 tablet (5 mg total) by mouth daily. 30 tablet 2   atorvastatin (LIPITOR) 40 MG tablet Take 1 tablet (40 mg total) by mouth daily. 90 tablet 1   Biotin 16109 MCG TABS Take 1 tablet by mouth daily.  Blood Glucose Monitoring Suppl (FREESTYLE LITE) w/Device KIT Use to check blood sugar 2 times daily 1 kit 0   carbamazepine (TEGRETOL XR) 200 MG 12 hr tablet Take 1 tablet (200 mg total) by mouth in the morning AND 2 tablets (400 mg total) at bedtime. 270 tablet 0   carbamazepine (TEGRETOL-XR) 200 MG 12 hr tablet Take 1 tablet (200 mg total) by mouth in the morning and then take 2 tablets (400 mg total) by mouth at bedtime. 270 tablet 0   carbamazepine (TEGRETOL-XR) 200 MG 12 hr tablet Take 1 tablet (200 mg total) by mouth every morning AND 2 tablets (400 mg total) at bedtime. 270 tablet 0   clobetasol ointment (TEMOVATE) 0.05 % APPLY 1 APPLICATION ON TO THE SKIN 2 TIMES DAILY. DO NOT USE FOR MORE THAN 7 DAYS 60 g 1   cyclobenzaprine (FLEXERIL) 10 MG tablet Take 1 tablet (10 mg total) by mouth at bedtime. 30 tablet 3   Dulaglutide (TRULICITY) 3 MG/0.5ML SOPN Inject 3 mg into the skin once a week. 6 mL 3   gabapentin (NEURONTIN) 800 MG tablet Take 1 tablet (800 mg total) by mouth 3 (three) times daily. 90 tablet 5   Glucosamine HCl (GLUCOSAMINE PO) Take 3 tablets by mouth daily.     glucose blood (FREESTYLE LITE) test strip Use to check blood sugar 2 times daily 100 each 3   lamoTRIgine (LAMICTAL) 200 MG tablet Take 1 tablet (200 mg total) by mouth at bedtime. 90 tablet 1   Lancets (FREESTYLE) lancets Use as instructed to check sugar 2 times daily 200 each 5   lisinopril (ZESTRIL) 40 MG tablet Take 1 tablet (40 mg total) by mouth daily. 90 tablet 0   Loratadine (CLARITIN PO) Take by mouth daily.     metFORMIN (GLUCOPHAGE) 1000 MG tablet Take 1 tablet (1,000 mg total) by mouth 2 (two) times daily with a meal. 180 tablet 3   Multiple Vitamin (MULTIVITAMIN) tablet Take 1 tablet by mouth  daily.     spironolactone (ALDACTONE) 25 MG tablet Take 1 tablet (25 mg total) by mouth daily. 90 tablet 1   TEGRETOL-XR 200 MG 12 hr tablet Take 1 tablet by mouth every morning and take 2 tablets at bedtime 270 tablet 0   TEGRETOL-XR 200 MG 12 hr tablet Take 1 tablet (200 mg total) by mouth in the morning AND 2 tablets (400 mg total) at bedtime. 270 tablet 0   tiaGABine (GABITRIL) 4 MG tablet Take 8 mg by mouth 2 (two) times daily.     tiaGABine (GABITRIL) 4 MG tablet Take 1 tablet (4 mg total) by mouth in the morning AND 2 tablets (8 mg total) at bedtime. (Patient taking differently: Take 1 tablet (4 mg total) by mouth in the morning AND 1 tablets (4 mg total) at bedtime.) 270 tablet 0   tiaGABine (GABITRIL) 4 MG tablet Take 1 tablet (4 mg total) by mouth every morning AND 2 tablets (8 mg total) at bedtime. 270 tablet 0   Turmeric (QC TUMERIC COMPLEX PO) Take by mouth daily.     Vitamin D, Ergocalciferol, (DRISDOL) 1.25 MG (50000 UNIT) CAPS capsule Take 1 capsule (50,000 Units total) by mouth every 7 (seven) days. 12 capsule 0   No current facility-administered medications on file prior to visit.   Allergies  Allergen Reactions   Zofran [Ondansetron] Other (See Comments)    Severe headaches    Family History  Problem Relation Age of Onset   Hyperlipidemia Mother  Diabetes Mother    Anxiety disorder Mother    Heart disease Mother    Hypertension Father    Lupus Father    Heart disease Father    Asthma Father    Colon polyps Father 66   Stroke Maternal Aunt    Seizures Paternal Grandfather    Stroke Paternal Grandfather    Mental illness Paternal Grandfather    Breast cancer Neg Hx    Colon cancer Neg Hx    Esophageal cancer Neg Hx    Stomach cancer Neg Hx    Rectal cancer Neg Hx    PE: BP 122/70   Pulse 70   Ht 5\' 8"  (1.727 m)   Wt 290 lb 6.4 oz (131.7 kg)   LMP 01/25/2008   SpO2 96%   BMI 44.16 kg/m  Wt Readings from Last 10 Encounters:  03/12/23 290 lb 6.4 oz  (131.7 kg)  03/05/23 286 lb (129.7 kg)  02/27/23 289 lb (131.1 kg)  01/30/23 290 lb (131.5 kg)  12/25/22 286 lb 2 oz (129.8 kg)  12/04/22 290 lb 6.4 oz (131.7 kg)  12/02/22 288 lb (130.6 kg)  09/26/22 290 lb (131.5 kg)  09/19/22 290 lb 6.4 oz (131.7 kg)  08/28/22 299 lb (135.6 kg)   Constitutional: overweight, in NAD Eyes:  EOMI, no exophthalmos ENT: no neck masses, no cervical lymphadenopathy Cardiovascular: RRR, No MRG Respiratory: CTA B Musculoskeletal: no deformities Skin:no rashes Neurological: no tremor with outstretched hands  ASSESSMENT: 1. DM2, insulin-independent, uncontrolled, with complications - DR  2. Obesity class III  3. HL  PLAN:  1. Patient with longstanding uncontrolled type 2 diabetes, on a complex medication regimen but with improvement of diabetes control after her gastric bypass surgery in 2017 and losing more than 100 pounds.  She gained some of her weight back, but she is still approximately 60 pounds under the original weight.  At last visit, HbA1c was higher, at 9.1%, after relaxing diet, missing Trulicity doses, and not checking blood sugars.  But she had another HbA1c obtained 02/2023 and this is much better, at 7.0%.  She continues on metformin and Trulicity. -At today's visit, she tells me that she is taking Trulicity consistently.  Sugars have improved, but but they are still likely elevated in the morning and after certain meals.  She started to make changes in her diet to reduce carbs and reduce takeout.  We will continue with this.  However, since her sugars remain elevated, I also recommended an SGLT2 inhibitor.  Discussed about the benefits but also the possibility of yeast infections and I advised her to let me know if she develops one.  She agreed to start Farxiga-will try low-dose first.  We will continue the rest of the regimen for now. - I advised her to:  Patient Instructions  Please continue: - Metformin 1000 mg 2x a day - Trulicity 3 mg  weekly  Please try to start: - Farxiga 5 mg before b'fast  Please come back for a follow-up appointment in 3-4 months.  - advised to check sugars at different times of the day - 1x a day, rotating check times - advised for yearly eye exams >> she is UTD - return to clinic in 3-4 months    2. Obesity class III -She has a history of Roux-en-Y gastric bypass surgery, after which she lost a significant amount of weight but she started to gain weight back afterwards.   -Continues on Trulicity which should also help with weight  loss -Weight was stable at last visit, previously lost 4 pounds -She lost 4 pounds since last visit, but then gained them back -Will try to start Farxiga to see if this helps further with weight loss  3. HL -Latest lipid panel showed fractions at goal except for an LDL above our target of less than 70 due to presence of diabetic retinopathy: Lab Results  Component Value Date   CHOL 172 03/05/2023   HDL 58.60 03/05/2023   LDLCALC 88 03/05/2023   LDLDIRECT 112.0 06/21/2014   TRIG 127.0 03/05/2023   CHOLHDL 3 03/05/2023  -She is on Lipitor 40 mg daily.  She has muscle cramps but she does not feel that these are related to Lipitor.  Carlus Pavlov, MD PhD [facility] Endocrinology

## 2023-03-13 ENCOUNTER — Other Ambulatory Visit: Payer: Self-pay | Admitting: Internal Medicine

## 2023-03-13 ENCOUNTER — Encounter: Payer: Self-pay | Admitting: Internal Medicine

## 2023-03-13 ENCOUNTER — Other Ambulatory Visit (HOSPITAL_COMMUNITY): Payer: Self-pay

## 2023-03-13 ENCOUNTER — Other Ambulatory Visit (HOSPITAL_BASED_OUTPATIENT_CLINIC_OR_DEPARTMENT_OTHER): Payer: Self-pay

## 2023-03-13 MED ORDER — EMPAGLIFLOZIN 10 MG PO TABS
10.0000 mg | ORAL_TABLET | Freq: Every day | ORAL | 3 refills | Status: DC
  Filled 2023-03-13: qty 90, 90d supply, fill #0
  Filled 2023-06-04: qty 90, 90d supply, fill #1

## 2023-03-13 NOTE — Telephone Encounter (Signed)
Okay for Jardiance?

## 2023-03-16 ENCOUNTER — Encounter: Payer: Self-pay | Admitting: Family Medicine

## 2023-03-16 ENCOUNTER — Other Ambulatory Visit (HOSPITAL_BASED_OUTPATIENT_CLINIC_OR_DEPARTMENT_OTHER): Payer: Self-pay

## 2023-03-16 DIAGNOSIS — M62830 Muscle spasm of back: Secondary | ICD-10-CM

## 2023-03-16 MED ORDER — CYCLOBENZAPRINE HCL 10 MG PO TABS
10.0000 mg | ORAL_TABLET | Freq: Every day | ORAL | 3 refills | Status: DC
  Filled 2023-03-16: qty 30, 30d supply, fill #0
  Filled 2023-04-08 – 2023-04-09 (×2): qty 30, 30d supply, fill #1
  Filled 2023-05-08: qty 30, 30d supply, fill #2
  Filled 2023-06-08: qty 30, 30d supply, fill #3

## 2023-03-18 ENCOUNTER — Other Ambulatory Visit (HOSPITAL_BASED_OUTPATIENT_CLINIC_OR_DEPARTMENT_OTHER): Payer: Self-pay

## 2023-03-19 ENCOUNTER — Other Ambulatory Visit (HOSPITAL_BASED_OUTPATIENT_CLINIC_OR_DEPARTMENT_OTHER): Payer: Self-pay

## 2023-03-20 ENCOUNTER — Other Ambulatory Visit (HOSPITAL_BASED_OUTPATIENT_CLINIC_OR_DEPARTMENT_OTHER): Payer: Self-pay

## 2023-03-24 ENCOUNTER — Other Ambulatory Visit: Payer: Self-pay | Admitting: Family Medicine

## 2023-03-24 ENCOUNTER — Other Ambulatory Visit (HOSPITAL_BASED_OUTPATIENT_CLINIC_OR_DEPARTMENT_OTHER): Payer: Self-pay

## 2023-03-24 MED ORDER — ATORVASTATIN CALCIUM 40 MG PO TABS
40.0000 mg | ORAL_TABLET | Freq: Every day | ORAL | 1 refills | Status: DC
  Filled 2023-03-24: qty 90, 90d supply, fill #0
  Filled 2023-06-22: qty 90, 90d supply, fill #1

## 2023-03-25 ENCOUNTER — Other Ambulatory Visit (HOSPITAL_BASED_OUTPATIENT_CLINIC_OR_DEPARTMENT_OTHER): Payer: Self-pay

## 2023-03-30 ENCOUNTER — Other Ambulatory Visit: Payer: Self-pay | Admitting: Cardiovascular Disease

## 2023-03-31 ENCOUNTER — Other Ambulatory Visit (HOSPITAL_BASED_OUTPATIENT_CLINIC_OR_DEPARTMENT_OTHER): Payer: Self-pay

## 2023-03-31 MED ORDER — LISINOPRIL 40 MG PO TABS
40.0000 mg | ORAL_TABLET | Freq: Every day | ORAL | 2 refills | Status: DC
  Filled 2023-03-31: qty 90, 90d supply, fill #0
  Filled 2023-06-25: qty 90, 90d supply, fill #1
  Filled 2023-09-23: qty 90, 90d supply, fill #2

## 2023-04-08 ENCOUNTER — Other Ambulatory Visit (HOSPITAL_BASED_OUTPATIENT_CLINIC_OR_DEPARTMENT_OTHER): Payer: Self-pay

## 2023-04-08 NOTE — Progress Notes (Unsigned)
[name] [address] [place] [address] Phone: [phone] Subjective:   Bruce Donath, am serving as a scribe for Dr. Antoine Primas.  I'm seeing this patient by the request  of:  Sheliah Hatch, MD  CC: Foot pain follow-up  [id number]  02/27/2023 Still has bony abnormality noted and some posttraumatic arthritic changes noted of the third and fourth metatarsals proximally. 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 Patient will continue with the rigid soled shoe or add a carbon fiber plate to her regular sneakers at this time. Discussed recovery sandals, continue the vitamin D supplementation. Recheck iron to make sure patient continues to do well with supplementation. I do feel that the leg cramps still need to see how patient responds to using the CPAP regularly with patient being noncompliant previously. Follow-up with me again in 4 to 6 weeks   Updated 04/09/2023 [name] is a 52 y.o. female coming in with complaint of foot pain. Pain still present. Wearing OOFOS in the house but these hurt her foot.        Past Medical History:  Diagnosis Date   Allergy    seasonal allergies   Anemia    hx of   Anxiety    on meds   Asthma    hx of   Bipolar disorder (HCC)    Cataract    bilateral sx   Chicken pox    Complication of anesthesia    oxygen saturation dropped after hysterectomy 2009 at [facility]   Depression    on meds   Diabetes mellitus    on meds   Dyspnea    GERD (gastroesophageal reflux disease)    hx of   Hyperlipidemia    on meds   Hypertension    readings   Hypothyroidism    hx of-LEFT thyroid removed   Irregular heartbeat    saw dr Myrtis Ser sees -cardiology as needed   Migraine    Sleep apnea    uses cpap   Past Surgical History:  Procedure Laterality Date   ANKLE SURGERY Left 1989   BACK SURGERY  1999    CATARACT EXTRACTION, BILATERAL     CESAREAN SECTION     1995   LAPAROSCOPIC ASSISTED VAGINAL HYSTERECTOMY  2009     BSO fibroids, DUB, pelvic pain   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 REPAIR N/A 04/14/2016   Procedure: LAPAROSCOPIC ROUX-EN-Y GASTRIC BYPASS  WITH UPPER ENDOSCOPY;  Surgeon: Glenna Fellows, MD;  Location: [facility];  Service: General;  Laterality: N/A;   SHOULDER ARTHROSCOPY WITH OPEN ROTATOR CUFF REPAIR Right 01/02/2017   Procedure: Right shoulder arthroscopy, A-subcromial decompression, mini open rotator cuff repair, open distal clavicle resection, biceps tenodesis;  Surgeon: Beverely Low, MD;  Location: [facility] OR;  Service: Orthopedics;  Laterality: Right;   THYROIDECTOMY, PARTIAL Left 2001   UMBILICAL HERNIA REPAIR  2003   WISDOM TOOTH EXTRACTION     Social History   Socioeconomic History   Marital status: Married    Spouse name: Not on file   Number of children: 1   Years of education: Not on file   Highest education level: Associate degree: academic program  Occupational History   Occupation: nursing  Tobacco Use   Smoking status: Never   Smokeless tobacco: Never  Vaping Use   Vaping status: Never Used  Substance  and Sexual Activity   Alcohol use: No    Alcohol/week: 0.0 standard drinks of alcohol   Drug use: No   Sexual activity: Yes    Partners: Male  Other Topics Concern   Not on file  Social History Narrative   Not on file   Social Determinants of Health   Financial Resource Strain: Low Risk  (12/24/2022)   Overall Financial Resource Strain (CARDIA)    Difficulty of Paying Living Expenses: Not hard at all  Food Insecurity: No Food Insecurity (12/24/2022)   Hunger Vital Sign    Worried About Running Out of Food in the Last Year: Never true    Ran Out of Food in the Last Year: Never true  Transportation Needs: No Transportation Needs (12/24/2022)   PRAPARE - Administrator, Civil Service (Medical): No    Lack of  Transportation (Non-Medical): No  Physical Activity: Unknown (12/24/2022)   Exercise Vital Sign    Days of Exercise per Week: 0 days    Minutes of Exercise per Session: Not on file  Stress: No Stress Concern Present (12/24/2022)   [employer] of Occupational Health - Occupational Stress Questionnaire    Feeling of Stress : Not at all  Social Connections: Moderately Isolated (12/24/2022)   Social Connection and Isolation Panel [NHANES]    Frequency of Communication with Friends and Family: Twice a week    Frequency of Social Gatherings with Friends and Family: Once a week    Attends Religious Services: Never    Database administrator or Organizations: No    Attends Engineer, structural: Not on file    Marital Status: Married   Allergies  Allergen Reactions   Zofran [Ondansetron] Other (See Comments)    Severe headaches    Family History  Problem Relation Age of Onset   Hyperlipidemia Mother    Diabetes Mother    Anxiety disorder Mother    Heart disease Mother    Hypertension Father    Lupus Father    Heart disease Father    Asthma Father    Colon polyps Father 65   Stroke Maternal Aunt    Seizures Paternal Grandfather    Stroke Paternal Grandfather    Mental illness Paternal Grandfather    Breast cancer Neg Hx    Colon cancer Neg Hx    Esophageal cancer Neg Hx    Stomach cancer Neg Hx    Rectal cancer Neg Hx     Current Outpatient Medications (Endocrine & Metabolic):    Dulaglutide (TRULICITY) 3 MG/0.5ML SOPN, Inject 3 mg into the skin once a week.   empagliflozin (JARDIANCE) 10 MG TABS tablet, Take 1 tablet (10 mg total) by mouth daily before breakfast.   metFORMIN (GLUCOPHAGE) 1000 MG tablet, Take 1 tablet (1,000 mg total) by mouth 2 (two) times daily with a meal.  Current Outpatient Medications (Cardiovascular):    atorvastatin (LIPITOR) 40 MG tablet, Take 1 tablet (40 mg total) by mouth daily.   lisinopril (ZESTRIL) 40 MG tablet, Take 1 tablet (40 mg  total) by mouth daily.   spironolactone (ALDACTONE) 25 MG tablet, Take 1 tablet (25 mg total) by mouth daily.  Current Outpatient Medications (Respiratory):    Loratadine (CLARITIN PO), Take by mouth daily.    Current Outpatient Medications (Other):    ARIPiprazole (ABILIFY) 5 MG tablet, Take 1 tablet (5 mg total) by mouth daily.   ARIPiprazole (ABILIFY) 5 MG tablet, Take 1 tablet (5 mg total) by  mouth daily.   Biotin 01027 MCG TABS, Take 1 tablet by mouth daily.   Blood Glucose Monitoring Suppl (FREESTYLE LITE) w/Device KIT, Use to check blood sugar 2 times daily   carbamazepine (TEGRETOL XR) 200 MG 12 hr tablet, Take 1 tablet (200 mg total) by mouth in the morning AND 2 tablets (400 mg total) at bedtime.   carbamazepine (TEGRETOL-XR) 200 MG 12 hr tablet, Take 1 tablet (200 mg total) by mouth in the morning and then take 2 tablets (400 mg total) by mouth at bedtime.   carbamazepine (TEGRETOL-XR) 200 MG 12 hr tablet, Take 1 tablet (200 mg total) by mouth every morning AND 2 tablets (400 mg total) at bedtime.   clobetasol ointment (TEMOVATE) 0.05 %, APPLY 1 APPLICATION ON TO THE SKIN 2 TIMES DAILY. DO NOT USE FOR MORE THAN 7 DAYS   cyclobenzaprine (FLEXERIL) 10 MG tablet, Take 1 tablet (10 mg total) by mouth at bedtime.   gabapentin (NEURONTIN) 800 MG tablet, Take 1 tablet (800 mg total) by mouth 3 (three) times daily.   Glucosamine HCl (GLUCOSAMINE PO), Take 3 tablets by mouth daily.   glucose blood (FREESTYLE LITE) test strip, Use to check blood sugar 2 times daily   lamoTRIgine (LAMICTAL) 200 MG tablet, Take 1 tablet (200 mg total) by mouth at bedtime.   Lancets (FREESTYLE) lancets, Use as instructed to check sugar 2 times daily   Multiple Vitamin (MULTIVITAMIN) tablet, Take 1 tablet by mouth daily.   TEGRETOL-XR 200 MG 12 hr tablet, Take 1 tablet by mouth every morning and take 2 tablets at bedtime   TEGRETOL-XR 200 MG 12 hr tablet, Take 1 tablet (200 mg total) by mouth in the morning AND  2 tablets (400 mg total) at bedtime.   tiaGABine (GABITRIL) 4 MG tablet, Take 8 mg by mouth 2 (two) times daily.   tiaGABine (GABITRIL) 4 MG tablet, Take 1 tablet (4 mg total) by mouth in the morning AND 2 tablets (8 mg total) at bedtime. (Patient taking differently: Take 1 tablet (4 mg total) by mouth in the morning AND 1 tablets (4 mg total) at bedtime.)   tiaGABine (GABITRIL) 4 MG tablet, Take 1 tablet (4 mg total) by mouth every morning AND 2 tablets (8 mg total) at bedtime.   Turmeric (QC TUMERIC COMPLEX PO), Take by mouth daily.   Vitamin D, Ergocalciferol, (DRISDOL) 1.25 MG (50000 UNIT) CAPS capsule, Take 1 capsule (50,000 Units total) by mouth every 7 (seven) days.   Reviewed prior external information including notes and imaging from  primary care provider As well as notes that were available from care everywhere and other healthcare systems.  Past medical history, social, surgical and family history all reviewed in electronic medical record.  No pertanent information unless stated regarding to the chief complaint.   Review of Systems:  No headache, visual changes, nausea, vomiting, diarrhea, constipation, dizziness, abdominal pain, skin rash, fevers, chills, night sweats, weight loss, swollen lymph nodes, body aches, joint swelling, chest pain, shortness of breath, mood changes. POSITIVE muscle aches  Objective  Blood pressure 128/84, pulse 76, height 5\' 8"  (1.727 m), weight 287 lb (130.2 kg), last menstrual period 01/25/2008, SpO2 95%.   General: No apparent distress alert and oriented x3 mood and affect normal, dressed appropriately.  HEENT: Pupils equal, extraocular movements intact  Respiratory: Patient's speak in full sentences and does not appear short of breath  Antalgic gait noted.  Still having pain over the left foot.  This seems to be  more of the midfoot.  Proximal metatarsals of the third and fourth.  Minimal swelling of the distal midfoot noted as well.  This is  different than previous exam.  Limited muscular skeletal ultrasound was performed and interpreted by Antoine Primas, M   Limited ultrasound does show midfoot arthritic changes noted.  Patient does have hypoechoic changes with increase in Doppler flow between the third and fourth metatarsals proximally.  Does more have arthritic changes than anything else it appears at this time.   Impression and Recommendations:     The above documentation has been reviewed and is accurate and complete Judi Saa, DO

## 2023-04-09 ENCOUNTER — Other Ambulatory Visit: Payer: Self-pay

## 2023-04-09 ENCOUNTER — Ambulatory Visit: Payer: Commercial Managed Care - PPO | Admitting: Family Medicine

## 2023-04-09 ENCOUNTER — Encounter: Payer: Self-pay | Admitting: Family Medicine

## 2023-04-09 ENCOUNTER — Ambulatory Visit (INDEPENDENT_AMBULATORY_CARE_PROVIDER_SITE_OTHER): Payer: Commercial Managed Care - PPO

## 2023-04-09 VITALS — BP 128/84 | HR 76 | Ht 68.0 in | Wt 287.0 lb

## 2023-04-09 DIAGNOSIS — M79672 Pain in left foot: Secondary | ICD-10-CM | POA: Diagnosis not present

## 2023-04-09 DIAGNOSIS — M19071 Primary osteoarthritis, right ankle and foot: Secondary | ICD-10-CM | POA: Diagnosis not present

## 2023-04-09 DIAGNOSIS — M79671 Pain in right foot: Secondary | ICD-10-CM

## 2023-04-09 DIAGNOSIS — M7731 Calcaneal spur, right foot: Secondary | ICD-10-CM | POA: Diagnosis not present

## 2023-04-09 DIAGNOSIS — M19072 Primary osteoarthritis, left ankle and foot: Secondary | ICD-10-CM | POA: Diagnosis not present

## 2023-04-09 DIAGNOSIS — M84375A Stress fracture, left foot, initial encounter for fracture: Secondary | ICD-10-CM | POA: Diagnosis not present

## 2023-04-09 NOTE — Assessment & Plan Note (Signed)
Concerned that the stress fracture is starting into more of a arthritic change at the moment.  No longer responding to the conservative therapy.  Patient has been in a rigid soled shoe for quite some time.  No significant improvement.  Unable to walk around barefoot.  Do feel that we should consider the possibility of advanced imaging at this point.  Will get x-rays to further evaluate and then get MRI of the left foot.  Patient is status post bariatric surgery that could cause some deficiencies that could be complaining a role as well.  Patient will be put in a cam walker and do weightbearing as tolerated until further imaging is done and then we will discuss further.

## 2023-04-09 NOTE — Patient Instructions (Signed)
Cam walker Xray both feet MRI L foot [id number] We will be in touch

## 2023-04-17 ENCOUNTER — Encounter: Payer: Self-pay | Admitting: Family Medicine

## 2023-04-17 ENCOUNTER — Telehealth: Payer: Commercial Managed Care - PPO | Admitting: Family Medicine

## 2023-04-17 ENCOUNTER — Other Ambulatory Visit (HOSPITAL_BASED_OUTPATIENT_CLINIC_OR_DEPARTMENT_OTHER): Payer: Self-pay

## 2023-04-17 DIAGNOSIS — R399 Unspecified symptoms and signs involving the genitourinary system: Secondary | ICD-10-CM | POA: Diagnosis not present

## 2023-04-17 MED ORDER — CEPHALEXIN 500 MG PO CAPS
500.0000 mg | ORAL_CAPSULE | Freq: Two times a day (BID) | ORAL | 0 refills | Status: AC
  Filled 2023-04-17: qty 14, 7d supply, fill #0

## 2023-04-17 NOTE — Progress Notes (Signed)

## 2023-04-20 ENCOUNTER — Other Ambulatory Visit (HOSPITAL_BASED_OUTPATIENT_CLINIC_OR_DEPARTMENT_OTHER): Payer: Self-pay

## 2023-04-20 ENCOUNTER — Other Ambulatory Visit: Payer: Self-pay | Admitting: Neurology

## 2023-04-20 ENCOUNTER — Encounter: Payer: Self-pay | Admitting: Neurology

## 2023-04-20 MED ORDER — GABAPENTIN 800 MG PO TABS
800.0000 mg | ORAL_TABLET | Freq: Three times a day (TID) | ORAL | 5 refills | Status: DC
  Filled 2023-04-20 – 2023-04-22 (×3): qty 90, 30d supply, fill #0
  Filled 2023-05-25: qty 90, 30d supply, fill #1
  Filled 2023-06-22: qty 90, 30d supply, fill #2
  Filled 2023-08-24: qty 90, 30d supply, fill #3
  Filled 2023-09-17: qty 90, 30d supply, fill #4

## 2023-04-21 ENCOUNTER — Other Ambulatory Visit: Payer: Self-pay

## 2023-04-21 ENCOUNTER — Other Ambulatory Visit (HOSPITAL_BASED_OUTPATIENT_CLINIC_OR_DEPARTMENT_OTHER): Payer: Self-pay

## 2023-04-22 ENCOUNTER — Other Ambulatory Visit: Payer: Self-pay

## 2023-04-22 ENCOUNTER — Other Ambulatory Visit (HOSPITAL_BASED_OUTPATIENT_CLINIC_OR_DEPARTMENT_OTHER): Payer: Self-pay

## 2023-04-24 ENCOUNTER — Other Ambulatory Visit (HOSPITAL_BASED_OUTPATIENT_CLINIC_OR_DEPARTMENT_OTHER): Payer: Self-pay

## 2023-04-26 ENCOUNTER — Encounter: Payer: Self-pay | Admitting: Internal Medicine

## 2023-04-26 ENCOUNTER — Other Ambulatory Visit (HOSPITAL_BASED_OUTPATIENT_CLINIC_OR_DEPARTMENT_OTHER): Payer: Self-pay

## 2023-04-26 MED ORDER — VITAMIN D (ERGOCALCIFEROL) 1.25 MG (50000 UNIT) PO CAPS
50000.0000 [IU] | ORAL_CAPSULE | ORAL | 0 refills | Status: DC
  Filled 2023-04-26: qty 12, 84d supply, fill #0

## 2023-04-27 ENCOUNTER — Other Ambulatory Visit: Payer: Self-pay

## 2023-04-27 ENCOUNTER — Other Ambulatory Visit (HOSPITAL_BASED_OUTPATIENT_CLINIC_OR_DEPARTMENT_OTHER): Payer: Self-pay

## 2023-04-27 ENCOUNTER — Other Ambulatory Visit: Payer: Self-pay | Admitting: Internal Medicine

## 2023-04-27 MED ORDER — TERCONAZOLE 0.4 % VA CREA
1.0000 | TOPICAL_CREAM | Freq: Every day | VAGINAL | 0 refills | Status: DC
  Filled 2023-04-27: qty 45, 30d supply, fill #0

## 2023-04-27 MED ORDER — FLUCONAZOLE 150 MG PO TABS
150.0000 mg | ORAL_TABLET | Freq: Once | ORAL | 0 refills | Status: AC
Start: 2023-04-27 — End: 2023-04-28
  Filled 2023-04-27: qty 1, 1d supply, fill #0

## 2023-04-28 ENCOUNTER — Other Ambulatory Visit (HOSPITAL_BASED_OUTPATIENT_CLINIC_OR_DEPARTMENT_OTHER): Payer: Self-pay

## 2023-04-29 ENCOUNTER — Other Ambulatory Visit (HOSPITAL_BASED_OUTPATIENT_CLINIC_OR_DEPARTMENT_OTHER): Payer: Self-pay

## 2023-04-29 DIAGNOSIS — F411 Generalized anxiety disorder: Secondary | ICD-10-CM | POA: Diagnosis not present

## 2023-04-29 DIAGNOSIS — F3132 Bipolar disorder, current episode depressed, moderate: Secondary | ICD-10-CM | POA: Diagnosis not present

## 2023-04-29 MED ORDER — ARIPIPRAZOLE 5 MG PO TABS
5.0000 mg | ORAL_TABLET | Freq: Every day | ORAL | 2 refills | Status: DC
  Filled 2023-06-16: qty 30, 30d supply, fill #0
  Filled 2023-07-14 – 2023-07-20 (×2): qty 30, 30d supply, fill #1

## 2023-04-29 MED ORDER — LAMOTRIGINE 200 MG PO TABS
300.0000 mg | ORAL_TABLET | Freq: Every day | ORAL | 0 refills | Status: DC
  Filled 2023-04-29 – 2023-05-19 (×9): qty 135, 90d supply, fill #0

## 2023-04-30 ENCOUNTER — Other Ambulatory Visit (HOSPITAL_BASED_OUTPATIENT_CLINIC_OR_DEPARTMENT_OTHER): Payer: Self-pay

## 2023-04-30 MED ORDER — CARBAMAZEPINE ER 200 MG PO TB12
ORAL_TABLET | ORAL | 0 refills | Status: DC
  Filled 2023-04-30 – 2023-06-04 (×2): qty 270, 90d supply, fill #0

## 2023-05-01 ENCOUNTER — Other Ambulatory Visit (HOSPITAL_BASED_OUTPATIENT_CLINIC_OR_DEPARTMENT_OTHER): Payer: Self-pay

## 2023-05-03 ENCOUNTER — Ambulatory Visit
Admission: RE | Admit: 2023-05-03 | Discharge: 2023-05-03 | Disposition: A | Discharge status: Home / Self Care | Payer: Commercial Managed Care - PPO | Source: Ambulatory Visit | Attending: Family Medicine | Admitting: Family Medicine

## 2023-05-03 DIAGNOSIS — M79672 Pain in left foot: Secondary | ICD-10-CM

## 2023-05-03 DIAGNOSIS — M7672 Peroneal tendinitis, left leg: Secondary | ICD-10-CM | POA: Diagnosis not present

## 2023-05-03 DIAGNOSIS — M19072 Primary osteoarthritis, left ankle and foot: Secondary | ICD-10-CM | POA: Diagnosis not present

## 2023-05-04 ENCOUNTER — Other Ambulatory Visit (HOSPITAL_BASED_OUTPATIENT_CLINIC_OR_DEPARTMENT_OTHER): Payer: Self-pay

## 2023-05-05 ENCOUNTER — Other Ambulatory Visit (HOSPITAL_BASED_OUTPATIENT_CLINIC_OR_DEPARTMENT_OTHER): Payer: Self-pay

## 2023-05-06 ENCOUNTER — Other Ambulatory Visit (HOSPITAL_BASED_OUTPATIENT_CLINIC_OR_DEPARTMENT_OTHER): Payer: Self-pay

## 2023-05-07 ENCOUNTER — Other Ambulatory Visit (HOSPITAL_BASED_OUTPATIENT_CLINIC_OR_DEPARTMENT_OTHER): Payer: Self-pay

## 2023-05-08 ENCOUNTER — Other Ambulatory Visit (HOSPITAL_BASED_OUTPATIENT_CLINIC_OR_DEPARTMENT_OTHER): Payer: Self-pay

## 2023-05-17 ENCOUNTER — Encounter: Payer: Self-pay | Admitting: Family Medicine

## 2023-05-19 ENCOUNTER — Other Ambulatory Visit (HOSPITAL_BASED_OUTPATIENT_CLINIC_OR_DEPARTMENT_OTHER): Payer: Self-pay

## 2023-05-25 ENCOUNTER — Other Ambulatory Visit (HOSPITAL_BASED_OUTPATIENT_CLINIC_OR_DEPARTMENT_OTHER): Payer: Self-pay

## 2023-06-01 ENCOUNTER — Institutional Professional Consult (permissible substitution) (HOSPITAL_BASED_OUTPATIENT_CLINIC_OR_DEPARTMENT_OTHER): Payer: Commercial Managed Care - PPO | Admitting: Pulmonary Disease

## 2023-06-01 ENCOUNTER — Other Ambulatory Visit (HOSPITAL_BASED_OUTPATIENT_CLINIC_OR_DEPARTMENT_OTHER): Payer: Self-pay

## 2023-06-02 ENCOUNTER — Other Ambulatory Visit (HOSPITAL_BASED_OUTPATIENT_CLINIC_OR_DEPARTMENT_OTHER): Payer: Self-pay

## 2023-06-03 ENCOUNTER — Other Ambulatory Visit (HOSPITAL_BASED_OUTPATIENT_CLINIC_OR_DEPARTMENT_OTHER): Payer: Self-pay

## 2023-06-03 ENCOUNTER — Other Ambulatory Visit: Payer: Self-pay

## 2023-06-04 ENCOUNTER — Other Ambulatory Visit (HOSPITAL_BASED_OUTPATIENT_CLINIC_OR_DEPARTMENT_OTHER): Payer: Self-pay

## 2023-06-11 ENCOUNTER — Ambulatory Visit: Payer: Commercial Managed Care - PPO | Admitting: Neurology

## 2023-06-16 ENCOUNTER — Other Ambulatory Visit (HOSPITAL_BASED_OUTPATIENT_CLINIC_OR_DEPARTMENT_OTHER): Payer: Self-pay

## 2023-06-22 ENCOUNTER — Other Ambulatory Visit: Payer: Self-pay

## 2023-06-22 ENCOUNTER — Other Ambulatory Visit (HOSPITAL_BASED_OUTPATIENT_CLINIC_OR_DEPARTMENT_OTHER): Payer: Self-pay

## 2023-06-22 NOTE — Progress Notes (Unsigned)
[name] [address] [place] [address] Phone: [phone] Subjective:   [name] am a scribe for Dr. Katrinka Blazing.   I'm seeing this patient by the request  of:  Sheliah Hatch, MD  CC: Foot and ankle pain follow-up  [id number]  04/09/2023 Concerned that the stress fracture is starting into more of a arthritic change at the moment.  No longer responding to the conservative therapy.  Patient has been in a rigid soled shoe for quite some time.  No significant improvement.  Unable to walk around barefoot.  Do feel that we should consider the possibility of advanced imaging at this point.  Will get x-rays to further evaluate and then get MRI of the left foot.  Patient is status post bariatric surgery that could cause some deficiencies that could be complaining a role as well.  Patient will be put in a cam walker and do weightbearing as tolerated until further imaging is done and then we will discuss further.     Update: 06-24-23 Patient states that first week out of the boot went well, but this week it feels a little achy where there is arthritis. Have not worn the boot this week. MRI results discussion.  [name] is a 53 y.o. female coming in with complaint of foot pain.  MRI IMPRESSION: 1. Mild tendinosis of the peroneus brevis. Mild peroneal tenosynovitis. 2. Mild-moderate distal Achilles tendinosis. 3. Severe osteoarthritis of the navicular-middle cuneiform. Moderate osteoarthritis of the navicular-lateral cuneiform. Mild osteoarthritis of the navicular-medial cuneiform. Mild osteoarthritis of the second, third and fourth TMT joints.     Past Medical History:  Diagnosis Date   Allergy    seasonal allergies   Anemia    hx of   Anxiety    on meds   Asthma    hx of   Bipolar disorder (HCC)    Cataract    bilateral sx   Chicken pox    Complication of anesthesia    oxygen saturation dropped after hysterectomy  2009 at [facility]   Depression    on meds   Diabetes mellitus    on meds   Dyspnea    GERD (gastroesophageal reflux disease)    hx of   Hyperlipidemia    on meds   Hypertension    readings   Hypothyroidism    hx of-LEFT thyroid removed   Irregular heartbeat    saw dr Myrtis Ser sees -cardiology as needed   Migraine    Sleep apnea    uses cpap   Past Surgical History:  Procedure Laterality Date   ANKLE SURGERY Left 1989   BACK SURGERY  1999   CATARACT EXTRACTION, BILATERAL     CESAREAN SECTION     1995   LAPAROSCOPIC ASSISTED VAGINAL HYSTERECTOMY  2009     BSO fibroids, DUB, pelvic pain   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 REPAIR N/A 04/14/2016   Procedure: LAPAROSCOPIC ROUX-EN-Y GASTRIC BYPASS  WITH UPPER ENDOSCOPY;  Surgeon: Glenna Fellows, MD;  Location: [facility];  Service: General;  Laterality: N/A;   SHOULDER ARTHROSCOPY WITH OPEN ROTATOR CUFF REPAIR Right 01/02/2017   Procedure: Right shoulder arthroscopy, A-subcromial decompression, mini open rotator cuff repair, open distal clavicle resection, biceps tenodesis;  Surgeon: Beverely Low, MD;  Location: [facility];  Service: Orthopedics;  Laterality: Right;   THYROIDECTOMY, PARTIAL Left 2001   UMBILICAL HERNIA REPAIR  2003   WISDOM TOOTH EXTRACTION     Social  History   Socioeconomic History   Marital status: Married    Spouse name: Not on file   Number of children: 1   Years of education: Not on file   Highest education level: Associate degree: academic program  Occupational History   Occupation: nursing  Tobacco Use   Smoking status: Never   Smokeless tobacco: Never  Vaping Use   Vaping status: Never Used  Substance and Sexual Activity   Alcohol use: No    Alcohol/week: 0.0 standard drinks of alcohol   Drug use: No   Sexual activity: Yes    Partners: Male  Other Topics Concern   Not on file  Social History Narrative   Not on file   Social Drivers of Health   Financial Resource Strain: Low  Risk  (12/24/2022)   Overall Financial Resource Strain (CARDIA)    Difficulty of Paying Living Expenses: Not hard at all  Food Insecurity: No Food Insecurity (12/24/2022)   Hunger Vital Sign    Worried About Running Out of Food in the Last Year: Never true    Ran Out of Food in the Last Year: Never true  Transportation Needs: No Transportation Needs (12/24/2022)   PRAPARE - Administrator, Civil Service (Medical): No    Lack of Transportation (Non-Medical): No  Physical Activity: Unknown (12/24/2022)   Exercise Vital Sign    Days of Exercise per Week: 0 days    Minutes of Exercise per Session: Not on file  Stress: No Stress Concern Present (12/24/2022)   [employer] of Occupational Health - Occupational Stress Questionnaire    Feeling of Stress : Not at all  Social Connections: Moderately Isolated (12/24/2022)   Social Connection and Isolation Panel [NHANES]    Frequency of Communication with Friends and Family: Twice a week    Frequency of Social Gatherings with Friends and Family: Once a week    Attends Religious Services: Never    Database administrator or Organizations: No    Attends Engineer, structural: Not on file    Marital Status: Married   Allergies  Allergen Reactions   Zofran [Ondansetron] Other (See Comments)    Severe headaches    Family History  Problem Relation Age of Onset   Hyperlipidemia Mother    Diabetes Mother    Anxiety disorder Mother    Heart disease Mother    Hypertension Father    Lupus Father    Heart disease Father    Asthma Father    Colon polyps Father 72   Stroke Maternal Aunt    Seizures Paternal Grandfather    Stroke Paternal Grandfather    Mental illness Paternal Grandfather    Breast cancer Neg Hx    Colon cancer Neg Hx    Esophageal cancer Neg Hx    Stomach cancer Neg Hx    Rectal cancer Neg Hx     Current Outpatient Medications (Endocrine & Metabolic):    Dulaglutide (TRULICITY) 3 MG/0.5ML SOAJ, Inject 3  mg into the skin once a week.   empagliflozin (JARDIANCE) 10 MG TABS tablet, Take 1 tablet (10 mg total) by mouth daily before breakfast.   metFORMIN (GLUCOPHAGE) 1000 MG tablet, Take 1 tablet (1,000 mg total) by mouth 2 (two) times daily with a meal.  Current Outpatient Medications (Cardiovascular):    atorvastatin (LIPITOR) 40 MG tablet, Take 1 tablet (40 mg total) by mouth daily.   lisinopril (ZESTRIL) 40 MG tablet, Take 1 tablet (40 mg total)  by mouth daily.   spironolactone (ALDACTONE) 25 MG tablet, Take 1 tablet (25 mg total) by mouth daily.  Current Outpatient Medications (Respiratory):    Loratadine (CLARITIN PO), Take by mouth daily.    Current Outpatient Medications (Other):    ARIPiprazole (ABILIFY) 5 MG tablet, Take 1 tablet (5 mg total) by mouth daily.   ARIPiprazole (ABILIFY) 5 MG tablet, Take 1 tablet (5 mg total) by mouth daily.   ARIPiprazole (ABILIFY) 5 MG tablet, Take 1 tablet (5 mg total) by mouth daily.   Biotin [address] MCG TABS, Take 1 tablet by mouth daily.   Blood Glucose Monitoring Suppl (FREESTYLE LITE) w/Device KIT, Use to check blood sugar 2 times daily   carbamazepine (TEGRETOL XR) 200 MG 12 hr tablet, Take 1 tablet (200 mg total) by mouth in the morning AND 2 tablets (400 mg total) at bedtime.   carbamazepine (TEGRETOL-XR) 200 MG 12 hr tablet, Take 1 tablet (200 mg total) by mouth in the morning and then take 2 tablets (400 mg total) by mouth at bedtime.   carbamazepine (TEGRETOL-XR) 200 MG 12 hr tablet, Take 1 tablet (200 mg total) by mouth every morning AND 2 tablets (400 mg total) at bedtime.   carbamazepine (TEGRETOL-XR) 200 MG 12 hr tablet, Take 1 tablet (200 mg total) by mouth in the morning AND 2 tablets (400 mg total) at bedtime.   cyclobenzaprine (FLEXERIL) 10 MG tablet, Take 1 tablet (10 mg total) by mouth at bedtime.   gabapentin (NEURONTIN) 800 MG tablet, Take 1 tablet (800 mg total) by mouth 3 (three) times daily.   Glucosamine HCl (GLUCOSAMINE PO),  Take 3 tablets by mouth daily.   glucose blood (FREESTYLE LITE) test strip, Use to check blood sugar 2 times daily   lamoTRIgine (LAMICTAL) 200 MG tablet, Take 1 tablet (200 mg total) by mouth at bedtime.   lamoTRIgine (LAMICTAL) 200 MG tablet, Take 1.5 tablets (300 mg total) by mouth at bedtime.   Lancets (FREESTYLE) lancets, Use as instructed to check sugar 2 times daily   Multiple Vitamin (MULTIVITAMIN) tablet, Take 1 tablet by mouth daily.   TEGRETOL-XR 200 MG 12 hr tablet, Take 1 tablet by mouth every morning and take 2 tablets at bedtime   TEGRETOL-XR 200 MG 12 hr tablet, Take 1 tablet (200 mg total) by mouth in the morning AND 2 tablets (400 mg total) at bedtime.   terconazole (TERAZOL 7) 0.4 % vaginal cream, Place 1 applicator vaginally at bedtime.   tiaGABine (GABITRIL) 4 MG tablet, Take 8 mg by mouth 2 (two) times daily.   tiaGABine (GABITRIL) 4 MG tablet, Take 1 tablet (4 mg total) by mouth in the morning AND 2 tablets (8 mg total) at bedtime. (Patient taking differently: Take 1 tablet (4 mg total) by mouth in the morning AND 1 tablets (4 mg total) at bedtime.)   tiaGABine (GABITRIL) 4 MG tablet, Take 1 tablet (4 mg total) by mouth every morning AND 2 tablets (8 mg total) at bedtime.   Turmeric (QC TUMERIC COMPLEX PO), Take by mouth daily.   Vitamin D, Ergocalciferol, (DRISDOL) 1.25 MG (50000 UNIT) CAPS capsule, Take 1 capsule (50,000 Units total) by mouth every 7 (seven) days.   Reviewed prior external information including notes and imaging from  primary care provider As well as notes that were available from care everywhere and other healthcare systems.  Past medical history, social, surgical and family history all reviewed in electronic medical record.  No pertanent information unless stated regarding to  the chief complaint.   Review of Systems:  No headache, visual changes, nausea, vomiting, diarrhea, constipation, dizziness, abdominal pain, skin rash, fevers, chills, night  sweats, weight loss, swollen lymph nodes, body aches, joint swelling, chest pain, shortness of breath, mood changes. POSITIVE muscle aches  Objective  Last menstrual period 01/25/2008.   General: No apparent distress alert and oriented x3 mood and affect normal, dressed appropriately.  HEENT: Pupils equal, extraocular movements intact  Respiratory: Patient's speak in full sentences and does not appear short of breath  Cardiovascular: No lower extremity edema, non tender, no erythema  Foot exam shows    Impression and Recommendations:     The above documentation has been reviewed and is accurate and complete Judi Saa, DO

## 2023-06-23 ENCOUNTER — Other Ambulatory Visit: Payer: Self-pay

## 2023-06-23 ENCOUNTER — Other Ambulatory Visit (HOSPITAL_BASED_OUTPATIENT_CLINIC_OR_DEPARTMENT_OTHER): Payer: Self-pay

## 2023-06-24 ENCOUNTER — Ambulatory Visit: Payer: Commercial Managed Care - PPO | Admitting: Family Medicine

## 2023-06-24 ENCOUNTER — Other Ambulatory Visit: Payer: Self-pay

## 2023-06-24 VITALS — BP 158/92 | HR 84 | Ht 68.0 in | Wt 291.0 lb

## 2023-06-24 DIAGNOSIS — M19072 Primary osteoarthritis, left ankle and foot: Secondary | ICD-10-CM

## 2023-06-24 DIAGNOSIS — M79672 Pain in left foot: Secondary | ICD-10-CM | POA: Diagnosis not present

## 2023-06-24 NOTE — Patient Instructions (Signed)
Injection in left foot.  Museum/gallery exhibitions officer from [employer] sports medicine [employer] shoes. Check in 2 to 3 months.

## 2023-06-25 ENCOUNTER — Ambulatory Visit: Payer: Commercial Managed Care - PPO | Admitting: Family Medicine

## 2023-06-25 ENCOUNTER — Encounter: Payer: Self-pay | Admitting: Family Medicine

## 2023-06-25 VITALS — BP 191/77 | Ht 69.0 in | Wt 290.0 lb

## 2023-06-25 DIAGNOSIS — M79672 Pain in left foot: Secondary | ICD-10-CM

## 2023-06-25 DIAGNOSIS — M19072 Primary osteoarthritis, left ankle and foot: Secondary | ICD-10-CM | POA: Insufficient documentation

## 2023-06-25 NOTE — Progress Notes (Signed)
PCP: Sheliah Hatch, MD  Subjective:   HPI: Patient is a 53 y.o. female here for custom orthotics.  Patient has been seeing Dr. 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 Here for custom orthotics to help support her midfoot arthritis.  Past Medical History:  Diagnosis Date   Allergy    seasonal allergies   Anemia    hx of   Anxiety    on meds   Asthma    hx of   Bipolar disorder (HCC)    Cataract    bilateral sx   Chicken pox    Complication of anesthesia    oxygen saturation dropped after hysterectomy 2009 at [facility]   Depression    on meds   Diabetes mellitus    on meds   Dyspnea    GERD (gastroesophageal reflux disease)    hx of   Hyperlipidemia    on meds   Hypertension    readings   Hypothyroidism    hx of-LEFT thyroid removed   Irregular heartbeat    saw dr Myrtis Ser sees -cardiology as needed   Migraine    Sleep apnea    uses cpap    Current Outpatient Medications on File Prior to Visit  Medication Sig Dispense Refill   ARIPiprazole (ABILIFY) 5 MG tablet Take 1 tablet (5 mg total) by mouth daily. 30 tablet 2   ARIPiprazole (ABILIFY) 5 MG tablet Take 1 tablet (5 mg total) by mouth daily. 30 tablet 2   ARIPiprazole (ABILIFY) 5 MG tablet Take 1 tablet (5 mg total) by mouth daily. 30 tablet 2   atorvastatin (LIPITOR) 40 MG tablet Take 1 tablet (40 mg total) by mouth daily. 90 tablet 1   Biotin 16109 MCG TABS Take 1 tablet by mouth daily.     Blood Glucose Monitoring Suppl (FREESTYLE LITE) w/Device KIT Use to check blood sugar 2 times daily 1 kit 0   carbamazepine (TEGRETOL XR) 200 MG 12 hr tablet Take 1 tablet (200 mg total) by mouth in the morning AND 2 tablets (400 mg total) at bedtime. 270 tablet 0   carbamazepine (TEGRETOL-XR) 200 MG 12 hr tablet Take 1 tablet (200 mg total) by mouth in the morning and then take 2 tablets (400 mg total) by mouth  at bedtime. 270 tablet 0   carbamazepine (TEGRETOL-XR) 200 MG 12 hr tablet Take 1 tablet (200 mg total) by mouth every morning AND 2 tablets (400 mg total) at bedtime. 270 tablet 0   carbamazepine (TEGRETOL-XR) 200 MG 12 hr tablet Take 1 tablet (200 mg total) by mouth in the morning AND 2 tablets (400 mg total) at bedtime. 270 tablet 0   cyclobenzaprine (FLEXERIL) 10 MG tablet Take 1 tablet (10 mg total) by mouth at bedtime. 30 tablet 3   Dulaglutide (TRULICITY) 3 MG/0.5ML SOAJ Inject 3 mg into the skin once a week. 6 mL 3   empagliflozin (JARDIANCE) 10 MG TABS tablet Take 1 tablet (10 mg total) by mouth daily before breakfast. 90 tablet 3   gabapentin (NEURONTIN) 800 MG tablet Take 1 tablet (800 mg total) by mouth 3 (three) times daily. 90 tablet 5   Glucosamine HCl (GLUCOSAMINE PO) Take 3 tablets by mouth daily.     glucose blood (FREESTYLE LITE) test strip Use to check blood sugar 2 times daily 100 each 3   lamoTRIgine (LAMICTAL) 200 MG tablet  Take 1 tablet (200 mg total) by mouth at bedtime. 90 tablet 1   lamoTRIgine (LAMICTAL) 200 MG tablet Take 1.5 tablets (300 mg total) by mouth at bedtime. 135 tablet 0   Lancets (FREESTYLE) lancets Use as instructed to check sugar 2 times daily 200 each 5   lisinopril (ZESTRIL) 40 MG tablet Take 1 tablet (40 mg total) by mouth daily. 90 tablet 2   Loratadine (CLARITIN PO) Take by mouth daily.     metFORMIN (GLUCOPHAGE) 1000 MG tablet Take 1 tablet (1,000 mg total) by mouth 2 (two) times daily with a meal. 180 tablet 3   Multiple Vitamin (MULTIVITAMIN) tablet Take 1 tablet by mouth daily.     spironolactone (ALDACTONE) 25 MG tablet Take 1 tablet (25 mg total) by mouth daily. 90 tablet 1   TEGRETOL-XR 200 MG 12 hr tablet Take 1 tablet by mouth every morning and take 2 tablets at bedtime 270 tablet 0   TEGRETOL-XR 200 MG 12 hr tablet Take 1 tablet (200 mg total) by mouth in the morning AND 2 tablets (400 mg total) at bedtime. 270 tablet 0   terconazole  (TERAZOL 7) 0.4 % vaginal cream Place 1 applicator vaginally at bedtime. 45 g 0   tiaGABine (GABITRIL) 4 MG tablet Take 8 mg by mouth 2 (two) times daily.     tiaGABine (GABITRIL) 4 MG tablet Take 1 tablet (4 mg total) by mouth in the morning AND 2 tablets (8 mg total) at bedtime. (Patient taking differently: Take 1 tablet (4 mg total) by mouth in the morning AND 1 tablets (4 mg total) at bedtime.) 270 tablet 0   tiaGABine (GABITRIL) 4 MG tablet Take 1 tablet (4 mg total) by mouth every morning AND 2 tablets (8 mg total) at bedtime. 270 tablet 0   Turmeric (QC TUMERIC COMPLEX PO) Take by mouth daily.     Vitamin D, Ergocalciferol, (DRISDOL) 1.25 MG (50000 UNIT) CAPS capsule Take 1 capsule (50,000 Units total) by mouth every 7 (seven) days. 12 capsule 0   No current facility-administered medications on file prior to visit.    Past Surgical History:  Procedure Laterality Date   ANKLE SURGERY Left 1989   BACK SURGERY  1999   CATARACT EXTRACTION, BILATERAL     CESAREAN SECTION     1995   LAPAROSCOPIC ASSISTED VAGINAL HYSTERECTOMY  2009     BSO fibroids, DUB, pelvic pain   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 REPAIR N/A 04/14/2016   Procedure: LAPAROSCOPIC ROUX-EN-Y GASTRIC BYPASS  WITH UPPER ENDOSCOPY;  Surgeon: Glenna Fellows, MD;  Location: [facility];  Service: General;  Laterality: N/A;   SHOULDER ARTHROSCOPY WITH OPEN ROTATOR CUFF REPAIR Right 01/02/2017   Procedure: Right shoulder arthroscopy, A-subcromial decompression, mini open rotator cuff repair, open distal clavicle resection, biceps tenodesis;  Surgeon: Beverely Low, MD;  Location: [facility] OR;  Service: Orthopedics;  Laterality: Right;   THYROIDECTOMY, PARTIAL Left 2001   UMBILICAL HERNIA REPAIR  2003   WISDOM TOOTH EXTRACTION      Allergies  Allergen Reactions   Zofran [Ondansetron] Other (See Comments)    Severe headaches     BP (!) 191/77   Ht 5\' 9"  (1.753 m)   Wt 290 lb (131.5 kg)   LMP 01/25/2008    BMI 42.83 kg/m       No data to display              No data to display  Objective:  Physical Exam:  Gen: NAD, comfortable in exam room  No leg length inequality.  Outturning bilateral feet with gait.  Mod pronation without orthotics.  Bilateral feet: Mod pronation.  Transverse arch collapse without callus over metatarsal heads.  4th metatarsal insufficiency.  Rotation of 4th and 5th digits.  No swelling, ecchymoses. Mild hallux rigidus on left. Full range of motion ankles. No tenderness though avoided navicular-middle cuneiform joint on exam Negative ant drawer and negative talar tilt.   NV intact distally.   Assessment & Plan:  1. Left midfoot arthritis - custom orthotics made today and felt comfortable.  Discussed how to break these in, what would prompt call to our office.  Follow up with Dr. Katrinka Blazing for continued treatment.  Patient was fitted for a : standard, cushioned, semi-rigid orthotic. The orthotic was heated and afterward the patient stood on the orthotic blank positioned on the orthotic stand. The patient was positioned in subtalar neutral position and 10 degrees of ankle dorsiflexion in a weight bearing stance. After completion of molding, a stable base was applied to the orthotic blank. The blank was ground to a stable position for weight bearing. Size: 12 fit & run Base: none Posting: none Additional orthotic padding: none  Total visit time 30 minutes including documentation.

## 2023-06-25 NOTE — Assessment & Plan Note (Signed)
Patient given injection and tolerated the procedure well.  Discussed with patient though I do think that custom orthotics are going to be necessary and referred appropriately.  Do think it will help give her more balance.  Discussed the recovery sandals in the house or anything with a rocker-bottom could help decrease the amount of discomfort in this area.  Would like patient to increase activity slowly otherwise.  Follow-up with me again in 6 to 8 weeks.

## 2023-06-26 ENCOUNTER — Encounter: Payer: Commercial Managed Care - PPO | Admitting: Family Medicine

## 2023-06-29 DIAGNOSIS — G4733 Obstructive sleep apnea (adult) (pediatric): Secondary | ICD-10-CM | POA: Diagnosis not present

## 2023-07-02 ENCOUNTER — Other Ambulatory Visit: Payer: Self-pay | Admitting: Family Medicine

## 2023-07-02 ENCOUNTER — Other Ambulatory Visit (HOSPITAL_BASED_OUTPATIENT_CLINIC_OR_DEPARTMENT_OTHER): Payer: Self-pay

## 2023-07-02 DIAGNOSIS — M62830 Muscle spasm of back: Secondary | ICD-10-CM

## 2023-07-02 MED ORDER — CYCLOBENZAPRINE HCL 10 MG PO TABS
10.0000 mg | ORAL_TABLET | Freq: Every day | ORAL | 3 refills | Status: DC
  Filled 2023-07-02: qty 30, 30d supply, fill #0
  Filled 2023-08-03: qty 30, 30d supply, fill #1
  Filled 2023-08-31: qty 30, 30d supply, fill #2
  Filled 2023-09-28: qty 30, 30d supply, fill #3

## 2023-07-07 ENCOUNTER — Encounter: Payer: Self-pay | Admitting: Family Medicine

## 2023-07-09 ENCOUNTER — Ambulatory Visit: Payer: Commercial Managed Care - PPO | Admitting: Internal Medicine

## 2023-07-09 DIAGNOSIS — F3132 Bipolar disorder, current episode depressed, moderate: Secondary | ICD-10-CM | POA: Diagnosis not present

## 2023-07-09 DIAGNOSIS — F411 Generalized anxiety disorder: Secondary | ICD-10-CM | POA: Diagnosis not present

## 2023-07-17 ENCOUNTER — Other Ambulatory Visit (HOSPITAL_BASED_OUTPATIENT_CLINIC_OR_DEPARTMENT_OTHER): Payer: Self-pay

## 2023-07-21 ENCOUNTER — Other Ambulatory Visit (HOSPITAL_BASED_OUTPATIENT_CLINIC_OR_DEPARTMENT_OTHER): Payer: Self-pay

## 2023-07-21 DIAGNOSIS — I10 Essential (primary) hypertension: Secondary | ICD-10-CM | POA: Diagnosis not present

## 2023-07-21 DIAGNOSIS — G629 Polyneuropathy, unspecified: Secondary | ICD-10-CM | POA: Diagnosis not present

## 2023-07-21 DIAGNOSIS — L309 Dermatitis, unspecified: Secondary | ICD-10-CM | POA: Diagnosis not present

## 2023-07-21 DIAGNOSIS — R252 Cramp and spasm: Secondary | ICD-10-CM | POA: Diagnosis not present

## 2023-07-21 DIAGNOSIS — F319 Bipolar disorder, unspecified: Secondary | ICD-10-CM | POA: Diagnosis not present

## 2023-07-21 DIAGNOSIS — G4710 Hypersomnia, unspecified: Secondary | ICD-10-CM | POA: Diagnosis not present

## 2023-07-21 DIAGNOSIS — E7800 Pure hypercholesterolemia, unspecified: Secondary | ICD-10-CM | POA: Diagnosis not present

## 2023-07-21 DIAGNOSIS — E11319 Type 2 diabetes mellitus with unspecified diabetic retinopathy without macular edema: Secondary | ICD-10-CM | POA: Diagnosis not present

## 2023-07-21 DIAGNOSIS — E1165 Type 2 diabetes mellitus with hyperglycemia: Secondary | ICD-10-CM | POA: Diagnosis not present

## 2023-07-21 DIAGNOSIS — G4730 Sleep apnea, unspecified: Secondary | ICD-10-CM | POA: Diagnosis not present

## 2023-07-21 MED ORDER — CYCLOBENZAPRINE HCL 10 MG PO TABS
10.0000 mg | ORAL_TABLET | Freq: Every day | ORAL | 1 refills | Status: DC
  Filled 2023-07-21 – 2023-10-28 (×2): qty 90, 90d supply, fill #0
  Filled 2024-01-25: qty 90, 90d supply, fill #1

## 2023-07-21 MED ORDER — CLOBETASOL PROPIONATE 0.05 % EX OINT
1.0000 | TOPICAL_OINTMENT | Freq: Two times a day (BID) | CUTANEOUS | 0 refills | Status: AC
  Filled 2023-07-21: qty 28, 14d supply, fill #0

## 2023-07-23 ENCOUNTER — Other Ambulatory Visit (HOSPITAL_BASED_OUTPATIENT_CLINIC_OR_DEPARTMENT_OTHER): Payer: Self-pay

## 2023-07-27 DIAGNOSIS — R0683 Snoring: Secondary | ICD-10-CM | POA: Diagnosis not present

## 2023-07-31 ENCOUNTER — Other Ambulatory Visit (HOSPITAL_BASED_OUTPATIENT_CLINIC_OR_DEPARTMENT_OTHER): Payer: Self-pay

## 2023-08-03 ENCOUNTER — Other Ambulatory Visit (HOSPITAL_BASED_OUTPATIENT_CLINIC_OR_DEPARTMENT_OTHER): Payer: Self-pay

## 2023-08-06 ENCOUNTER — Other Ambulatory Visit (HOSPITAL_BASED_OUTPATIENT_CLINIC_OR_DEPARTMENT_OTHER): Payer: Self-pay

## 2023-08-12 ENCOUNTER — Other Ambulatory Visit (HOSPITAL_BASED_OUTPATIENT_CLINIC_OR_DEPARTMENT_OTHER): Payer: Self-pay

## 2023-08-13 ENCOUNTER — Other Ambulatory Visit (HOSPITAL_BASED_OUTPATIENT_CLINIC_OR_DEPARTMENT_OTHER): Payer: Self-pay

## 2023-08-14 ENCOUNTER — Other Ambulatory Visit (HOSPITAL_BASED_OUTPATIENT_CLINIC_OR_DEPARTMENT_OTHER): Payer: Self-pay

## 2023-08-17 ENCOUNTER — Other Ambulatory Visit: Payer: Self-pay | Admitting: Cardiovascular Disease

## 2023-08-17 ENCOUNTER — Other Ambulatory Visit (HOSPITAL_BASED_OUTPATIENT_CLINIC_OR_DEPARTMENT_OTHER): Payer: Self-pay

## 2023-08-17 MED ORDER — SPIRONOLACTONE 25 MG PO TABS
25.0000 mg | ORAL_TABLET | Freq: Every day | ORAL | 1 refills | Status: DC
  Filled 2023-08-17: qty 90, 90d supply, fill #0
  Filled 2023-11-16: qty 90, 90d supply, fill #1

## 2023-08-19 ENCOUNTER — Other Ambulatory Visit (HOSPITAL_BASED_OUTPATIENT_CLINIC_OR_DEPARTMENT_OTHER): Payer: Self-pay

## 2023-08-19 DIAGNOSIS — F3132 Bipolar disorder, current episode depressed, moderate: Secondary | ICD-10-CM | POA: Diagnosis not present

## 2023-08-19 DIAGNOSIS — F411 Generalized anxiety disorder: Secondary | ICD-10-CM | POA: Diagnosis not present

## 2023-08-19 MED ORDER — TEGRETOL-XR 200 MG PO TB12
ORAL_TABLET | ORAL | 0 refills | Status: DC
  Filled 2023-08-19 – 2023-10-20 (×2): qty 270, 90d supply, fill #0

## 2023-08-19 MED ORDER — LAMOTRIGINE 200 MG PO TABS
300.0000 mg | ORAL_TABLET | Freq: Every day | ORAL | 0 refills | Status: DC
  Filled 2023-08-19: qty 135, 90d supply, fill #0

## 2023-08-20 ENCOUNTER — Other Ambulatory Visit (HOSPITAL_BASED_OUTPATIENT_CLINIC_OR_DEPARTMENT_OTHER): Payer: Self-pay

## 2023-08-20 MED ORDER — TIAGABINE HCL 4 MG PO TABS
4.0000 mg | ORAL_TABLET | Freq: Two times a day (BID) | ORAL | 0 refills | Status: DC
  Filled 2023-08-20 – 2024-01-18 (×3): qty 180, 90d supply, fill #0

## 2023-08-25 ENCOUNTER — Other Ambulatory Visit (HOSPITAL_BASED_OUTPATIENT_CLINIC_OR_DEPARTMENT_OTHER): Payer: Self-pay

## 2023-08-27 ENCOUNTER — Ambulatory Visit: Payer: Commercial Managed Care - PPO | Admitting: Family Medicine

## 2023-08-27 ENCOUNTER — Ambulatory Visit: Payer: Commercial Managed Care - PPO | Admitting: Internal Medicine

## 2023-08-31 ENCOUNTER — Other Ambulatory Visit: Payer: Self-pay

## 2023-09-02 ENCOUNTER — Other Ambulatory Visit (HOSPITAL_BASED_OUTPATIENT_CLINIC_OR_DEPARTMENT_OTHER): Payer: Self-pay

## 2023-09-03 ENCOUNTER — Ambulatory Visit: Payer: Commercial Managed Care - PPO | Admitting: Family Medicine

## 2023-09-14 ENCOUNTER — Other Ambulatory Visit (HOSPITAL_BASED_OUTPATIENT_CLINIC_OR_DEPARTMENT_OTHER): Payer: Self-pay

## 2023-09-14 ENCOUNTER — Other Ambulatory Visit: Payer: Self-pay | Admitting: Family Medicine

## 2023-09-14 MED ORDER — ATORVASTATIN CALCIUM 40 MG PO TABS
40.0000 mg | ORAL_TABLET | Freq: Every day | ORAL | 1 refills | Status: AC
  Filled 2024-04-26: qty 90, 90d supply, fill #0

## 2023-09-16 NOTE — Progress Notes (Unsigned)
 [facility] [address] [place] [address] Phone: [phone] Subjective:   [name], am serving as a scribe for Dr. Ronnell Coins.  I'm seeing this patient by the request  of:  Jess Morita, MD  CC: Left foot pain follow-up  [id number]  06/24/2023 Patient given injection and tolerated the procedure well.  Discussed with patient though I do think that custom orthotics are going to be necessary and referred appropriately.  Do think it will help give her more balance.  Discussed the recovery sandals in the house or anything with a rocker-bottom could help decrease the amount of discomfort in this area.  Would like patient to increase activity slowly otherwise.  Follow-up with me again in 6 to 8 weeks.     Updated 09/17/2023 [name] is a 53 y.o. female coming in with complaint of foot pain. Patient states that she has had an increase in pain over past 3 days. Pain in lateral aspect on top of foot.   Injection last visit was helpful. Pain never was close to 100% but injection diminished pain somewhat.  Feels like the pain is more on the left side of the foot.  Patient states that it is more on the outside symptoms.       Past Medical History:  Diagnosis Date   Allergy    seasonal allergies   Anemia    hx of   Anxiety    on meds   Asthma    hx of   Bipolar disorder (HCC)    Cataract    bilateral sx   Chicken pox    Complication of anesthesia    oxygen saturation dropped after hysterectomy 2009 at [facility]   Depression    on meds   Diabetes mellitus    on meds   Dyspnea    GERD (gastroesophageal reflux disease)    hx of   Hyperlipidemia    on meds   Hypertension    readings   Hypothyroidism    hx of-LEFT thyroid  removed   Irregular heartbeat    saw dr Ardell Koller sees -cardiology as needed   Migraine    Sleep apnea    uses cpap   Past Surgical History:  Procedure Laterality Date   ANKLE SURGERY Left 1989    BACK SURGERY  1999   CATARACT EXTRACTION, BILATERAL     CESAREAN SECTION     1995   LAPAROSCOPIC ASSISTED VAGINAL HYSTERECTOMY  2009     BSO fibroids, DUB, pelvic pain   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 REPAIR N/A 04/14/2016   Procedure: LAPAROSCOPIC ROUX-EN-Y GASTRIC BYPASS  WITH UPPER ENDOSCOPY;  Surgeon: Ayesha Lente, MD;  Location: [facility];  Service: General;  Laterality: N/A;   SHOULDER ARTHROSCOPY WITH OPEN ROTATOR CUFF REPAIR Right 01/02/2017   Procedure: Right shoulder arthroscopy, A-subcromial decompression, mini open rotator cuff repair, open distal clavicle resection, biceps tenodesis;  Surgeon: Winston Hawking, MD;  Location: [facility] OR;  Service: Orthopedics;  Laterality: Right;   THYROIDECTOMY, PARTIAL Left 2001   UMBILICAL HERNIA REPAIR  2003   WISDOM TOOTH EXTRACTION     Social History   Socioeconomic History   Marital status: Married    Spouse name: Not on file   Number of children: 1   Years of education: Not on file   Highest education level: Associate degree: academic program  Occupational History   Occupation: nursing  Tobacco Use   Smoking status: Never  Smokeless tobacco: Never  Vaping Use   Vaping status: Never Used  Substance and Sexual Activity   Alcohol use: No    Alcohol/week: 0.0 standard drinks of alcohol   Drug use: No   Sexual activity: Yes    Partners: Male  Other Topics Concern   Not on file  Social History Narrative   Not on file   Social Drivers of Health   Financial Resource Strain: Low Risk  (12/24/2022)   Overall Financial Resource Strain (CARDIA)    Difficulty of Paying Living Expenses: Not hard at all  Food Insecurity: No Food Insecurity (12/24/2022)   Hunger Vital Sign    Worried About Running Out of Food in the Last Year: Never true    Ran Out of Food in the Last Year: Never true  Transportation Needs: No Transportation Needs (12/24/2022)   PRAPARE - Administrator, Civil Service (Medical): No     Lack of Transportation (Non-Medical): No  Physical Activity: Unknown (12/24/2022)   Exercise Vital Sign    Days of Exercise per Week: 0 days    Minutes of Exercise per Session: Not on file  Stress: No Stress Concern Present (12/24/2022)   [employer] of Occupational Health - Occupational Stress Questionnaire    Feeling of Stress : Not at all  Social Connections: Moderately Isolated (12/24/2022)   Social Connection and Isolation Panel [NHANES]    Frequency of Communication with Friends and Family: Twice a week    Frequency of Social Gatherings with Friends and Family: Once a week    Attends Religious Services: Never    Database administrator or Organizations: No    Attends Engineer, structural: Not on file    Marital Status: Married   Allergies  Allergen Reactions   Zofran  [Ondansetron ] Other (See Comments)    Severe headaches    Family History  Problem Relation Age of Onset   Hyperlipidemia Mother    Diabetes Mother    Anxiety disorder Mother    Heart disease Mother    Hypertension Father    Lupus Father    Heart disease Father    Asthma Father    Colon polyps Father 31   Stroke Maternal Aunt    Seizures Paternal Grandfather    Stroke Paternal Grandfather    Mental illness Paternal Grandfather    Breast cancer Neg Hx    Colon cancer Neg Hx    Esophageal cancer Neg Hx    Stomach cancer Neg Hx    Rectal cancer Neg Hx     Current Outpatient Medications (Endocrine & Metabolic):    Dulaglutide  (TRULICITY ) 3 MG/0.5ML SOAJ, Inject 3 mg into the skin once a week.   empagliflozin  (JARDIANCE ) 10 MG TABS tablet, Take 1 tablet (10 mg total) by mouth daily before breakfast.   metFORMIN  (GLUCOPHAGE ) 1000 MG tablet, Take 1 tablet (1,000 mg total) by mouth 2 (two) times daily with a meal.  Current Outpatient Medications (Cardiovascular):    atorvastatin  (LIPITOR) 40 MG tablet, Take 1 tablet (40 mg total) by mouth daily.   lisinopril  (ZESTRIL ) 40 MG tablet, Take 1  tablet (40 mg total) by mouth daily.   spironolactone  (ALDACTONE ) 25 MG tablet, Take 1 tablet (25 mg total) by mouth daily.  Current Outpatient Medications (Respiratory):    Loratadine (CLARITIN PO), Take by mouth daily.    Current Outpatient Medications (Other):    ARIPiprazole  (ABILIFY ) 5 MG tablet, Take 1 tablet (5 mg total) by mouth daily.  ARIPiprazole  (ABILIFY ) 5 MG tablet, Take 1 tablet (5 mg total) by mouth daily.   ARIPiprazole  (ABILIFY ) 5 MG tablet, Take 1 tablet (5 mg total) by mouth daily.   Biotin 10000 MCG TABS, Take 1 tablet by mouth daily.   Blood Glucose Monitoring Suppl (FREESTYLE LITE) w/Device KIT, Use to check blood sugar 2 times daily   carbamazepine  (TEGRETOL  XR) 200 MG 12 hr tablet, Take 1 tablet (200 mg total) by mouth in the morning AND 2 tablets (400 mg total) at bedtime.   carbamazepine  (TEGRETOL -XR) 200 MG 12 hr tablet, Take 1 tablet (200 mg total) by mouth in the morning and then take 2 tablets (400 mg total) by mouth at bedtime.   carbamazepine  (TEGRETOL -XR) 200 MG 12 hr tablet, Take 1 tablet (200 mg total) by mouth every morning AND 2 tablets (400 mg total) at bedtime.   carbamazepine  (TEGRETOL -XR) 200 MG 12 hr tablet, Take 1 tablet (200 mg total) by mouth in the morning AND 2 tablets (400 mg total) at bedtime.   cyclobenzaprine  (FLEXERIL ) 10 MG tablet, Take 1 tablet (10 mg total) by mouth at bedtime.   cyclobenzaprine  (FLEXERIL ) 10 MG tablet, Take 1 tablet (10 mg total) by mouth at bedtime.   gabapentin  (NEURONTIN ) 800 MG tablet, Take 1 tablet (800 mg total) by mouth 3 (three) times daily.   Glucosamine HCl (GLUCOSAMINE PO), Take 3 tablets by mouth daily.   glucose blood (FREESTYLE LITE) test strip, Use to check blood sugar 2 times daily   lamoTRIgine  (LAMICTAL ) 200 MG tablet, Take 1.5 tablets (300 mg total) by mouth at bedtime.   Lancets (FREESTYLE) lancets, Use as instructed to check sugar 2 times daily   Multiple Vitamin (MULTIVITAMIN) tablet, Take 1  tablet by mouth daily.   TEGRETOL -XR 200 MG 12 hr tablet, Take 1 tablet by mouth every morning and take 2 tablets at bedtime   TEGRETOL -XR 200 MG 12 hr tablet, Take 1 tablet (200 mg total) by mouth in the morning AND 2 tablets (400 mg total) at bedtime.   TEGRETOL -XR 200 MG 12 hr tablet, Take 1 tablet (200 mg total) by mouth in the morning AND 2 tablets (400 mg total) at bedtime.   terconazole  (TERAZOL 7 ) 0.4 % vaginal cream, Place 1 applicator vaginally at bedtime.   tiaGABine  (GABITRIL ) 4 MG tablet, Take 8 mg by mouth 2 (two) times daily.   tiaGABine  (GABITRIL ) 4 MG tablet, Take 1 tablet (4 mg total) by mouth in the morning AND 2 tablets (8 mg total) at bedtime. (Patient taking differently: Take 1 tablet (4 mg total) by mouth in the morning AND 1 tablets (4 mg total) at bedtime.)   tiaGABine  (GABITRIL ) 4 MG tablet, Take 1 tablet (4 mg total) by mouth in the morning and at bedtime.   Turmeric (QC TUMERIC COMPLEX PO), Take by mouth daily.   Vitamin D , Ergocalciferol , (DRISDOL ) 1.25 MG (50000 UNIT) CAPS capsule, Take 1 capsule (50,000 Units total) by mouth every 7 (seven) days.   Reviewed prior external information including notes and imaging from  primary care provider As well as notes that were available from care everywhere and other healthcare systems.  Past medical history, social, surgical and family history all reviewed in electronic medical record.  No pertanent information unless stated regarding to the chief complaint.   Review of Systems:  No headache, visual changes, nausea, vomiting, diarrhea, constipation, dizziness, abdominal pain, skin rash, fevers, chills, night sweats, weight loss, swollen lymph nodes, body aches, joint swelling, chest pain,  shortness of breath, mood changes. POSITIVE muscle aches  Objective  Blood pressure 138/84, pulse 72, height 5\' 9"  (1.753 m), weight 288 lb (130.6 kg), last menstrual period 01/25/2008, SpO2 97%.   General: No apparent distress alert and  oriented x3 mood and affect normal, dressed appropriately.  HEENT: Pupils equal, extraocular movements intact  Respiratory: Patient's speak in full sentences and does not appear short of breath  Cardiovascular: No lower extremity edema, non tender, no erythema  Antalgic gait noted.  Significant rigid midfoot noted.  Tightness noted with certain range of motion.  Severe tenderness to palpation still movement noted in the midfoot.  Procedure: Real-time Ultrasound Guided Injection of navicular lateral cuneiform joint Device: GE Logiq Q7 Ultrasound guided injection is preferred based studies that show increased duration, increased effect, greater accuracy, decreased procedural pain, increased response rate, and decreased cost with ultrasound guided versus blind injection.  Verbal informed consent obtained.  Time-out conducted.  Noted no overlying erythema, induration, or other signs of local infection.  Skin prepped in a sterile fashion.  Local anesthesia: Topical Ethyl chloride.  With sterile technique and under real time ultrasound guidance: With a 25-gauge half inch needle injected with 0.5 cc of 0.5% Marcaine  and 0.5 cc of Kenalog 40 mg/mL Completed without difficulty  Pain immediately resolved suggesting accurate placement of the medication.  Advised to call if fevers/chills, erythema, induration, drainage, or persistent bleeding.  Impression: Technically successful ultrasound guided injection.    Impression and Recommendations:    The above documentation has been reviewed and is accurate and complete Cache Bills M Kyler Lerette, DO

## 2023-09-17 ENCOUNTER — Other Ambulatory Visit: Payer: Self-pay

## 2023-09-17 ENCOUNTER — Ambulatory Visit: Payer: Commercial Managed Care - PPO | Admitting: Family Medicine

## 2023-09-17 VITALS — BP 138/84 | HR 72 | Ht 69.0 in | Wt 288.0 lb

## 2023-09-17 DIAGNOSIS — M79672 Pain in left foot: Secondary | ICD-10-CM | POA: Diagnosis not present

## 2023-09-17 DIAGNOSIS — M19072 Primary osteoarthritis, left ankle and foot: Secondary | ICD-10-CM | POA: Diagnosis not present

## 2023-09-17 NOTE — Patient Instructions (Addendum)
Injected foot today See me again in 3 months

## 2023-09-18 ENCOUNTER — Encounter: Payer: Self-pay | Admitting: Family Medicine

## 2023-09-18 NOTE — Assessment & Plan Note (Signed)
 Patient's midfoot does have arthritic changes and in trying the more of the lateral injection today.  Hopeful that this will make an a difference.  Discussed icing regimen of home exercises, discussed which activities to do and which ones to avoid.  Increase activity slowly otherwise.  Follow-up again in 6 to 8 weeks

## 2023-09-28 ENCOUNTER — Encounter: Payer: Self-pay | Admitting: Cardiovascular Disease

## 2023-10-01 ENCOUNTER — Ambulatory Visit: Payer: Commercial Managed Care - PPO | Admitting: Internal Medicine

## 2023-10-07 NOTE — Progress Notes (Unsigned)
 NEUROLOGY FOLLOW UP OFFICE NOTE  [name] [id number]  Assessment/Plan:   Diabetic polyneuropathy    Will increase gabapentin  to 800mg  three times daily.  If no improvement, will decrease dose back to 600mg  three times daily (as prescribed by her psychiatrist for mood stabilization) and will start duloxetine Will also check ferritin level.  If normal level, stop ferrous sulfate. Follow up 6 months.        Subjective:  [name] is a 53 year old female with hypertension, DM II, depression and Bipolar disorder who follows up for bilateral leg pain and neuropathy.  UPDATE: Current medication:  gabapentin  800mg  TID.  In July, increased gabapentin  to 800mg  TID. ***   HISTORY: She began experiencing bilateral leg discomfort around 2022.  When she lays down in bed, she develops burning from her knees down to her feet.  She also develops muscle cramps or "charley horses" from her inner thighs down her legs.  Within an hour, she needs to get out of bed.  Moving her legs in the bed isn't too helpful but when she gets up to walk around, the symptoms resolve within 5 minutes.  Denies low back pain or radicular pain down the legs.  Sometimes laying on her left side may help delay discomfort of the leg cramps.  She has resorted to sleeping in the recliner.  She does have type 2 diabetes.  Hgb A1c from October was 81.  Over the past year, labs revealed TSH 1.35, B12 343, folate >24.2 and Mg 1.7.  CBC has not revealed anemia and BMP and hepatic panel have not revealed kidney or liver dysfunction. Ferritin level was 6.4.  She was advised to start ferrous sulfate 325mg  daily.  She started taking cyclobenzaprine  at night to help with the leg cramps.  She already takes gabapentin  800mg  three times daily for mood stabilizer.  She was also started on trial of ropinirole .  She noted some improvement but continued experiencing burning in the legs, so it was discontinued.  NCV-EMG of right upper and  left lower limbs on 09/29/2022 revealed mild active sensorimotor polyneuropathy.  TSH in April 2024 was 1.41.  ABI normal.      PAST MEDICAL HISTORY: Past Medical History:  Diagnosis Date   Allergy    seasonal allergies   Anemia    hx of   Anxiety    on meds   Asthma    hx of   Bipolar disorder (HCC)    Cataract    bilateral sx   Chicken pox    Complication of anesthesia    oxygen saturation dropped after hysterectomy 2009 at [facility]   Depression    on meds   Diabetes mellitus    on meds   Dyspnea    GERD (gastroesophageal reflux disease)    hx of   Hyperlipidemia    on meds   Hypertension    readings   Hypothyroidism    hx of-LEFT thyroid  removed   Irregular heartbeat    saw dr Ardell Koller sees -cardiology as needed   Migraine    Sleep apnea    uses cpap    MEDICATIONS: Current Outpatient Medications on File Prior to Visit  Medication Sig Dispense Refill   ARIPiprazole  (ABILIFY ) 5 MG tablet Take 1 tablet (5 mg total) by mouth daily. 30 tablet 2   ARIPiprazole  (ABILIFY ) 5 MG tablet Take 1 tablet (5 mg total) by mouth daily. 30 tablet 2   ARIPiprazole  (ABILIFY ) 5 MG  tablet Take 1 tablet (5 mg total) by mouth daily. 30 tablet 2   atorvastatin  (LIPITOR) 40 MG tablet Take 1 tablet (40 mg total) by mouth daily. 90 tablet 1   Biotin 10000 MCG TABS Take 1 tablet by mouth daily.     Blood Glucose Monitoring Suppl (FREESTYLE LITE) w/Device KIT Use to check blood sugar 2 times daily 1 kit 0   carbamazepine  (TEGRETOL  XR) 200 MG 12 hr tablet Take 1 tablet (200 mg total) by mouth in the morning AND 2 tablets (400 mg total) at bedtime. 270 tablet 0   carbamazepine  (TEGRETOL -XR) 200 MG 12 hr tablet Take 1 tablet (200 mg total) by mouth in the morning and then take 2 tablets (400 mg total) by mouth at bedtime. 270 tablet 0   carbamazepine  (TEGRETOL -XR) 200 MG 12 hr tablet Take 1 tablet (200 mg total) by mouth every morning AND 2 tablets (400 mg total) at bedtime. 270 tablet 0   carbamazepine   (TEGRETOL -XR) 200 MG 12 hr tablet Take 1 tablet (200 mg total) by mouth in the morning AND 2 tablets (400 mg total) at bedtime. 270 tablet 0   cyclobenzaprine  (FLEXERIL ) 10 MG tablet Take 1 tablet (10 mg total) by mouth at bedtime. 30 tablet 3   cyclobenzaprine  (FLEXERIL ) 10 MG tablet Take 1 tablet (10 mg total) by mouth at bedtime. 90 tablet 1   Dulaglutide  (TRULICITY ) 3 MG/0.5ML SOAJ Inject 3 mg into the skin once a week. 6 mL 3   empagliflozin  (JARDIANCE ) 10 MG TABS tablet Take 1 tablet (10 mg total) by mouth daily before breakfast. 90 tablet 3   gabapentin  (NEURONTIN ) 800 MG tablet Take 1 tablet (800 mg total) by mouth 3 (three) times daily. 90 tablet 5   Glucosamine HCl (GLUCOSAMINE PO) Take 3 tablets by mouth daily.     glucose blood (FREESTYLE LITE) test strip Use to check blood sugar 2 times daily 100 each 3   lamoTRIgine  (LAMICTAL ) 200 MG tablet Take 1.5 tablets (300 mg total) by mouth at bedtime. 135 tablet 0   Lancets (FREESTYLE) lancets Use as instructed to check sugar 2 times daily 200 each 5   lisinopril  (ZESTRIL ) 40 MG tablet Take 1 tablet (40 mg total) by mouth daily. 90 tablet 2   Loratadine (CLARITIN PO) Take by mouth daily.     metFORMIN  (GLUCOPHAGE ) 1000 MG tablet Take 1 tablet (1,000 mg total) by mouth 2 (two) times daily with a meal. 180 tablet 3   Multiple Vitamin (MULTIVITAMIN) tablet Take 1 tablet by mouth daily.     spironolactone  (ALDACTONE ) 25 MG tablet Take 1 tablet (25 mg total) by mouth daily. 90 tablet 1   TEGRETOL -XR 200 MG 12 hr tablet Take 1 tablet by mouth every morning and take 2 tablets at bedtime 270 tablet 0   TEGRETOL -XR 200 MG 12 hr tablet Take 1 tablet (200 mg total) by mouth in the morning AND 2 tablets (400 mg total) at bedtime. 270 tablet 0   TEGRETOL -XR 200 MG 12 hr tablet Take 1 tablet (200 mg total) by mouth in the morning AND 2 tablets (400 mg total) at bedtime. 270 tablet 0   terconazole  (TERAZOL 7 ) 0.4 % vaginal cream Place 1 applicator vaginally  at bedtime. 45 g 0   tiaGABine  (GABITRIL ) 4 MG tablet Take 8 mg by mouth 2 (two) times daily.     tiaGABine  (GABITRIL ) 4 MG tablet Take 1 tablet (4 mg total) by mouth in the morning AND 2 tablets (8  mg total) at bedtime. (Patient taking differently: Take 1 tablet (4 mg total) by mouth in the morning AND 1 tablets (4 mg total) at bedtime.) 270 tablet 0   tiaGABine  (GABITRIL ) 4 MG tablet Take 1 tablet (4 mg total) by mouth in the morning and at bedtime. 180 tablet 0   Turmeric (QC TUMERIC COMPLEX PO) Take by mouth daily.     Vitamin D , Ergocalciferol , (DRISDOL ) 1.25 MG (50000 UNIT) CAPS capsule Take 1 capsule (50,000 Units total) by mouth every 7 (seven) days. 12 capsule 0   No current facility-administered medications on file prior to visit.    ALLERGIES: Allergies  Allergen Reactions   Zofran  [Ondansetron ] Other (See Comments)    Severe headaches     FAMILY HISTORY: Family History  Problem Relation Age of Onset   Hyperlipidemia Mother    Diabetes Mother    Anxiety disorder Mother    Heart disease Mother    Hypertension Father    Lupus Father    Heart disease Father    Asthma Father    Colon polyps Father 28   Stroke Maternal Aunt    Seizures Paternal Grandfather    Stroke Paternal Grandfather    Mental illness Paternal Grandfather    Breast cancer Neg Hx    Colon cancer Neg Hx    Esophageal cancer Neg Hx    Stomach cancer Neg Hx    Rectal cancer Neg Hx       Objective:  *** General: No acute distress.  Patient appears well-groomed.   Head:  Normocephalic/atraumatic Neck:  Supple.  No paraspinal tenderness.  Full range of motion. Heart:  Regular rate and rhythm. Neuro:  Alert and oriented.  Speech fluent and not dysarthric.  Language intact.  CN II-XII intact.  Bulk and tone normal.  Muscle strength 5/5 throughout.  Deep tendon reflexes 2+ throughout.  Gait normal.  Romberg negative.     Janne Members, DO  CC: Laymon Priest, MD

## 2023-10-08 ENCOUNTER — Other Ambulatory Visit (HOSPITAL_BASED_OUTPATIENT_CLINIC_OR_DEPARTMENT_OTHER): Payer: Self-pay

## 2023-10-08 ENCOUNTER — Ambulatory Visit: Payer: Commercial Managed Care - PPO | Admitting: Neurology

## 2023-10-08 ENCOUNTER — Encounter: Payer: Self-pay | Admitting: Neurology

## 2023-10-08 VITALS — BP 150/80 | HR 83 | Ht 70.0 in | Wt 294.0 lb

## 2023-10-08 DIAGNOSIS — E1142 Type 2 diabetes mellitus with diabetic polyneuropathy: Secondary | ICD-10-CM

## 2023-10-08 MED ORDER — GABAPENTIN 800 MG PO TABS
800.0000 mg | ORAL_TABLET | Freq: Three times a day (TID) | ORAL | 5 refills | Status: DC
  Filled 2023-10-08 – 2023-10-19 (×2): qty 90, 30d supply, fill #0
  Filled 2023-11-16: qty 90, 30d supply, fill #1
  Filled 2023-12-14: qty 90, 30d supply, fill #2
  Filled 2024-01-12: qty 90, 30d supply, fill #3
  Filled 2024-02-09: qty 90, 30d supply, fill #4
  Filled 2024-03-07: qty 90, 30d supply, fill #5

## 2023-10-15 DIAGNOSIS — R1031 Right lower quadrant pain: Secondary | ICD-10-CM | POA: Diagnosis not present

## 2023-10-16 ENCOUNTER — Other Ambulatory Visit (HOSPITAL_COMMUNITY): Payer: Self-pay | Admitting: Family Medicine

## 2023-10-16 ENCOUNTER — Ambulatory Visit (HOSPITAL_BASED_OUTPATIENT_CLINIC_OR_DEPARTMENT_OTHER)
Admission: RE | Admit: 2023-10-16 | Discharge: 2023-10-16 | Disposition: A | Discharge status: Home / Self Care | Source: Ambulatory Visit | Attending: Family Medicine | Admitting: Family Medicine

## 2023-10-16 ENCOUNTER — Other Ambulatory Visit (HOSPITAL_BASED_OUTPATIENT_CLINIC_OR_DEPARTMENT_OTHER): Payer: Self-pay

## 2023-10-16 DIAGNOSIS — K8020 Calculus of gallbladder without cholecystitis without obstruction: Secondary | ICD-10-CM | POA: Diagnosis not present

## 2023-10-16 DIAGNOSIS — R1031 Right lower quadrant pain: Secondary | ICD-10-CM

## 2023-10-16 DIAGNOSIS — K838 Other specified diseases of biliary tract: Secondary | ICD-10-CM | POA: Diagnosis not present

## 2023-10-16 DIAGNOSIS — K5730 Diverticulosis of large intestine without perforation or abscess without bleeding: Secondary | ICD-10-CM | POA: Diagnosis not present

## 2023-10-16 LAB — POCT I-STAT CREATININE: Creatinine, Ser: 0.6 mg/dL (ref 0.44–1.00)

## 2023-10-16 MED ORDER — IOHEXOL 300 MG/ML  SOLN
100.0000 mL | Freq: Once | INTRAMUSCULAR | Status: AC | PRN
  Administered 2023-10-16: 100 mL via INTRAVENOUS

## 2023-10-19 ENCOUNTER — Other Ambulatory Visit (HOSPITAL_BASED_OUTPATIENT_CLINIC_OR_DEPARTMENT_OTHER): Payer: Self-pay

## 2023-10-20 ENCOUNTER — Other Ambulatory Visit (HOSPITAL_BASED_OUTPATIENT_CLINIC_OR_DEPARTMENT_OTHER): Payer: Self-pay

## 2023-10-20 ENCOUNTER — Other Ambulatory Visit: Payer: Self-pay

## 2023-10-22 ENCOUNTER — Other Ambulatory Visit (HOSPITAL_BASED_OUTPATIENT_CLINIC_OR_DEPARTMENT_OTHER): Payer: Self-pay

## 2023-10-22 ENCOUNTER — Other Ambulatory Visit: Payer: Self-pay

## 2023-10-22 MED ORDER — TIAGABINE HCL 4 MG PO TABS
4.0000 mg | ORAL_TABLET | Freq: Two times a day (BID) | ORAL | 0 refills | Status: DC
  Filled 2023-10-22: qty 180, 90d supply, fill #0

## 2023-10-22 MED ORDER — LAMOTRIGINE 200 MG PO TABS
300.0000 mg | ORAL_TABLET | Freq: Every day | ORAL | 0 refills | Status: DC
  Filled 2023-10-22 – 2023-11-16 (×2): qty 135, 90d supply, fill #0

## 2023-10-22 MED ORDER — TEGRETOL-XR 200 MG PO TB12
ORAL_TABLET | ORAL | 0 refills | Status: AC
  Filled 2023-10-22: qty 270, 90d supply, fill #0

## 2023-10-23 ENCOUNTER — Other Ambulatory Visit (HOSPITAL_BASED_OUTPATIENT_CLINIC_OR_DEPARTMENT_OTHER): Payer: Self-pay

## 2023-10-28 ENCOUNTER — Other Ambulatory Visit (HOSPITAL_BASED_OUTPATIENT_CLINIC_OR_DEPARTMENT_OTHER): Payer: Self-pay

## 2023-11-03 NOTE — Progress Notes (Unsigned)
 [facility] [address] [place] [address] Phone: [phone] Subjective:    I'm seeing this patient by the request  of:  Rae Bugler, MD  CC: right knee pain, left foot pain follow up   QVZ:[id number]  09/17/2023 Patient's midfoot does have arthritic changes and in trying the more of the lateral injection today.  Hopeful that this will make an a difference.  Discussed icing regimen of home exercises, discussed which activities to do and which ones to avoid.  Increase activity slowly otherwise.  Follow-up again in 6 to 8 weeks     Updated 11/04/2023 Parissa Samhitha Rosen is a 53 y.o. female coming in with complaint of L foot and R knee pain. Pain in R knee is worse than last visit. Fell asleep in recliner and when she woke up she had pain in patellar tendon, lateral joint line, and over the distal ITB.   Injection for L foot has been helpful. Had 2-3 weeks of no pain. Wants to wait until appt in July to possibly get injection in foot if needed at that time.   Left foot known arthritic changes noted.     Past Medical History:  Diagnosis Date   Allergy    seasonal allergies   Anemia    hx of   Anxiety    on meds   Asthma    hx of   Bipolar disorder (HCC)    Cataract    bilateral sx   Chicken pox    Complication of anesthesia    oxygen saturation dropped after hysterectomy 2009 at [facility]   Depression    on meds   Diabetes mellitus    on meds   Dyspnea    GERD (gastroesophageal reflux disease)    hx of   Hyperlipidemia    on meds   Hypertension    readings   Hypothyroidism    hx of-LEFT thyroid  removed   Irregular heartbeat    saw dr Ardell Koller sees -cardiology as needed   Migraine    Sleep apnea    uses cpap   Past Surgical History:  Procedure Laterality Date   ANKLE SURGERY Left 1989   BACK SURGERY  1999   CATARACT EXTRACTION, BILATERAL     CESAREAN SECTION     1995   LAPAROSCOPIC ASSISTED VAGINAL HYSTERECTOMY  2009     BSO  fibroids, DUB, pelvic pain   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 REPAIR N/A 04/14/2016   Procedure: LAPAROSCOPIC ROUX-EN-Y GASTRIC BYPASS  WITH UPPER ENDOSCOPY;  Surgeon: Ayesha Lente, MD;  Location: [facility]S;  Service: General;  Laterality: N/A;   SHOULDER ARTHROSCOPY WITH OPEN ROTATOR CUFF REPAIR Right 01/02/2017   Procedure: Right shoulder arthroscopy, A-subcromial decompression, mini open rotator cuff repair, open distal clavicle resection, biceps tenodesis;  Surgeon: Winston Hawking, MD;  Location: [facility] OR;  Service: Orthopedics;  Laterality: Right;   THYROIDECTOMY, PARTIAL Left 2001   UMBILICAL HERNIA REPAIR  2003   WISDOM TOOTH EXTRACTION     Social History   Socioeconomic History   Marital status: Married    Spouse name: Not on file   Number of children: 1   Years of education: Not on file   Highest education level: Associate degree: academic program  Occupational History   Occupation: nursing  Tobacco Use   Smoking status: Never   Smokeless tobacco: Never  Vaping Use   Vaping status: Never Used  Substance and Sexual Activity   Alcohol  use: No    Alcohol/week: 0.0 standard drinks of alcohol   Drug use: No   Sexual activity: Yes    Partners: Male  Other Topics Concern   Not on file  Social History Narrative   Not on file   Social Drivers of Health   Financial Resource Strain: Low Risk  (12/24/2022)   Overall Financial Resource Strain (CARDIA)    Difficulty of Paying Living Expenses: Not hard at all  Food Insecurity: No Food Insecurity (12/24/2022)   Hunger Vital Sign    Worried About Running Out of Food in the Last Year: Never true    Ran Out of Food in the Last Year: Never true  Transportation Needs: No Transportation Needs (12/24/2022)   PRAPARE - Administrator, Civil Service (Medical): No    Lack of Transportation (Non-Medical): No  Physical Activity: Unknown (12/24/2022)   Exercise Vital Sign    Days of Exercise per Week: 0 days     Minutes of Exercise per Session: Not on file  Stress: No Stress Concern Present (12/24/2022)   [employer] of Occupational Health - Occupational Stress Questionnaire    Feeling of Stress : Not at all  Social Connections: Moderately Isolated (12/24/2022)   Social Connection and Isolation Panel [NHANES]    Frequency of Communication with Friends and Family: Twice a week    Frequency of Social Gatherings with Friends and Family: Once a week    Attends Religious Services: Never    Database administrator or Organizations: No    Attends Engineer, structural: Not on file    Marital Status: Married   Allergies  Allergen Reactions   Zofran  [Ondansetron ] Other (See Comments)    Severe headaches    Family History  Problem Relation Age of Onset   Hyperlipidemia Mother    Diabetes Mother    Anxiety disorder Mother    Heart disease Mother    Hypertension Father    Lupus Father    Heart disease Father    Asthma Father    Colon polyps Father 30   Stroke Maternal Aunt    Seizures Paternal Grandfather    Stroke Paternal Grandfather    Mental illness Paternal Grandfather    Breast cancer Neg Hx    Colon cancer Neg Hx    Esophageal cancer Neg Hx    Stomach cancer Neg Hx    Rectal cancer Neg Hx     Current Outpatient Medications (Endocrine & Metabolic):    Dulaglutide  (TRULICITY ) 3 MG/0.5ML SOAJ, Inject 3 mg into the skin once a week.   metFORMIN  (GLUCOPHAGE ) 1000 MG tablet, Take 1 tablet (1,000 mg total) by mouth 2 (two) times daily with a meal.  Current Outpatient Medications (Cardiovascular):    atorvastatin  (LIPITOR) 40 MG tablet, Take 1 tablet (40 mg total) by mouth daily.   lisinopril  (ZESTRIL ) 40 MG tablet, Take 1 tablet (40 mg total) by mouth daily.   spironolactone  (ALDACTONE ) 25 MG tablet, Take 1 tablet (25 mg total) by mouth daily.  Current Outpatient Medications (Respiratory):    Loratadine (CLARITIN PO), Take by mouth daily.    Current Outpatient  Medications (Other):    Biotin 10000 MCG TABS, Take 1 tablet by mouth daily.   Blood Glucose Monitoring Suppl (FREESTYLE LITE) w/Device KIT, Use to check blood sugar 2 times daily   carbamazepine  (TEGRETOL  XR) 200 MG 12 hr tablet, Take 1 tablet (200 mg total) by mouth in the morning AND 2 tablets (400  mg total) at bedtime.   carbamazepine  (TEGRETOL -XR) 200 MG 12 hr tablet, Take 1 tablet (200 mg total) by mouth in the morning and then take 2 tablets (400 mg total) by mouth at bedtime.   carbamazepine  (TEGRETOL -XR) 200 MG 12 hr tablet, Take 1 tablet (200 mg total) by mouth every morning AND 2 tablets (400 mg total) at bedtime.   carbamazepine  (TEGRETOL -XR) 200 MG 12 hr tablet, Take 1 tablet (200 mg total) by mouth in the morning AND 2 tablets (400 mg total) at bedtime.   cyclobenzaprine  (FLEXERIL ) 10 MG tablet, Take 1 tablet (10 mg total) by mouth at bedtime.   cyclobenzaprine  (FLEXERIL ) 10 MG tablet, Take 1 tablet (10 mg total) by mouth at bedtime as needed.   gabapentin  (NEURONTIN ) 800 MG tablet, Take 1 tablet (800 mg total) by mouth 3 (three) times daily.   Glucosamine HCl (GLUCOSAMINE PO), Take 3 tablets by mouth daily.   glucose blood (FREESTYLE LITE) test strip, Use to check blood sugar 2 times daily   lamoTRIgine  (LAMICTAL ) 200 MG tablet, Take 1.5 tablets (300 mg total) by mouth at bedtime.   Lancets (FREESTYLE) lancets, Use as instructed to check sugar 2 times daily   Multiple Vitamin (MULTIVITAMIN) tablet, Take 1 tablet by mouth daily.   TEGRETOL -XR 200 MG 12 hr tablet, Take 1 tablet by mouth every morning and take 2 tablets at bedtime   TEGRETOL -XR 200 MG 12 hr tablet, Take 1 tablet (200 mg total) by mouth in the morning AND 2 tablets (400 mg total) at bedtime.   TEGRETOL -XR 200 MG 12 hr tablet, Take 1 tablet (200 mg total) by mouth in the morning AND 2 tablets (400 mg total) at bedtime.   TEGRETOL -XR 200 MG 12 hr tablet, Take 1 tablet (200 mg total) by mouth in the morning AND 2 tablets  (400 mg total) at bedtime.   terconazole  (TERAZOL 7 ) 0.4 % vaginal cream, Place 1 applicator vaginally at bedtime.   tiaGABine  (GABITRIL ) 4 MG tablet, Take 8 mg by mouth 2 (two) times daily.   tiaGABine  (GABITRIL ) 4 MG tablet, Take 1 tablet (4 mg total) by mouth in the morning AND 2 tablets (8 mg total) at bedtime. (Patient taking differently: Take 1 tablet (4 mg total) by mouth in the morning AND 1 tablets (4 mg total) at bedtime.)   tiaGABine  (GABITRIL ) 4 MG tablet, Take 1 tablet (4 mg total) by mouth in the morning and at bedtime.   tiaGABine  (GABITRIL ) 4 MG tablet, Take 1 tablet (4 mg total) by mouth in the morning and at bedtime.   Turmeric (QC TUMERIC COMPLEX PO), Take by mouth daily.   Reviewed prior external information including notes and imaging from  primary care provider As well as notes that were available from care everywhere and other healthcare systems.  Past medical history, social, surgical and family history all reviewed in electronic medical record.  No pertanent information unless stated regarding to the chief complaint.   Review of Systems:  No headache, visual changes, nausea, vomiting, diarrhea, constipation, dizziness, abdominal pain, skin rash, fevers, chills, night sweats, weight loss, swollen lymph nodes, body aches, joint swelling, chest pain, shortness of breath, mood changes. POSITIVE muscle aches  Objective  Last menstrual period 01/25/2008.   General: No apparent distress alert and oriented x3 mood and affect normal, dressed appropriately.  HEENT: Pupils equal, extraocular movements intact  Respiratory: Patient's speak in full sentences and does not appear short of breath  Cardiovascular: No lower extremity edema, non  tender, no erythema  Right knee exam shows instability noted with valgus and varus force.  Significant valgus deformity of the knee noted.  Instability with walking.  Abnormal thigh to calf ratio noted.  After informed written and verbal  consent, patient was seated on exam table. Right knee was prepped with alcohol swab and utilizing anterolateral approach, patient's right knee space was injected with 4:1  marcaine  0.5%: Kenalog 40mg /dL. Patient tolerated the procedure well without immediate complications.    Impression and Recommendations:    The above documentation has been reviewed and is accurate and complete Quincey Nored M Frida Wahlstrom, DO

## 2023-11-04 ENCOUNTER — Encounter: Payer: Self-pay | Admitting: Family Medicine

## 2023-11-04 ENCOUNTER — Ambulatory Visit (INDEPENDENT_AMBULATORY_CARE_PROVIDER_SITE_OTHER)

## 2023-11-04 ENCOUNTER — Other Ambulatory Visit: Payer: Self-pay

## 2023-11-04 ENCOUNTER — Telehealth: Payer: Self-pay

## 2023-11-04 ENCOUNTER — Ambulatory Visit: Admitting: Family Medicine

## 2023-11-04 VITALS — BP 118/84 | HR 72 | Ht 70.0 in | Wt 298.0 lb

## 2023-11-04 DIAGNOSIS — M1711 Unilateral primary osteoarthritis, right knee: Secondary | ICD-10-CM | POA: Diagnosis not present

## 2023-11-04 DIAGNOSIS — G8929 Other chronic pain: Secondary | ICD-10-CM | POA: Diagnosis not present

## 2023-11-04 DIAGNOSIS — M25561 Pain in right knee: Secondary | ICD-10-CM | POA: Diagnosis not present

## 2023-11-04 DIAGNOSIS — M79672 Pain in left foot: Secondary | ICD-10-CM | POA: Diagnosis not present

## 2023-11-04 NOTE — Telephone Encounter (Signed)
 Patient ran for monovisc for right knee on 11/04/23. Case [id number]. Pending approval.

## 2023-11-04 NOTE — Assessment & Plan Note (Signed)
 Chronic problem with exacerbation.  Discussed icing regimen and home exercises.  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  Instability with valgus and varus force.  Patient is ambulatory abnormal thigh to calf ratio.  Would highly recommend a custom lateral unloading brace with a Tru pull lite to see if this would give her more stability and allow her to be more active.  Patient is only 53 years of age.  Discussed icing regimen and home exercises otherwise.  Discussed the importance of weight loss.  Given an injection today and could be a candidate for viscosupplementation.  Follow-up with me again in 6 to 8 weeks.

## 2023-11-04 NOTE — Patient Instructions (Addendum)
 [place] or [place] will call you Xray today See you in July

## 2023-11-04 NOTE — Telephone Encounter (Signed)
-----   Message from Karron Pagan sent at 11/04/2023  3:36 PM EDT ----- Regarding: Gel approval Gel approval for R knee please

## 2023-11-05 NOTE — Telephone Encounter (Signed)
 Prior Authorization is required per portal. Portal states that it faxed the office a form. PA will be sent once forms have been completed. Approval still pending.

## 2023-11-06 NOTE — Telephone Encounter (Signed)
 Filled out and faxed PA form to [facility]

## 2023-11-09 NOTE — Telephone Encounter (Signed)
 Patient scheduled for 12/10/23  Monovisc authorized for right knee Deductible $600 has met $300 Once deductible has been met patient responsible for copay  Copay $50 OOP MAX $7900 has met $1937.68 Once OOP has been met coverage goes to 100% AUTH # [id number] 11/06/23-11/04/24

## 2023-11-09 NOTE — Telephone Encounter (Signed)
 Note:

## 2023-11-10 ENCOUNTER — Other Ambulatory Visit: Payer: Self-pay | Admitting: Family Medicine

## 2023-11-10 DIAGNOSIS — Z1231 Encounter for screening mammogram for malignant neoplasm of breast: Secondary | ICD-10-CM

## 2023-11-16 ENCOUNTER — Other Ambulatory Visit (HOSPITAL_BASED_OUTPATIENT_CLINIC_OR_DEPARTMENT_OTHER): Payer: Self-pay

## 2023-11-19 ENCOUNTER — Encounter: Payer: Self-pay | Admitting: Family Medicine

## 2023-11-19 DIAGNOSIS — F3132 Bipolar disorder, current episode depressed, moderate: Secondary | ICD-10-CM | POA: Diagnosis not present

## 2023-11-19 DIAGNOSIS — F411 Generalized anxiety disorder: Secondary | ICD-10-CM | POA: Diagnosis not present

## 2023-11-20 ENCOUNTER — Encounter (HOSPITAL_COMMUNITY): Payer: Self-pay | Admitting: *Deleted

## 2023-11-24 DIAGNOSIS — M1711 Unilateral primary osteoarthritis, right knee: Secondary | ICD-10-CM | POA: Diagnosis not present

## 2023-11-26 ENCOUNTER — Encounter: Payer: Self-pay | Admitting: Cardiovascular Disease

## 2023-11-26 ENCOUNTER — Other Ambulatory Visit (HOSPITAL_BASED_OUTPATIENT_CLINIC_OR_DEPARTMENT_OTHER): Payer: Self-pay

## 2023-11-26 MED ORDER — LISINOPRIL 40 MG PO TABS
40.0000 mg | ORAL_TABLET | Freq: Every day | ORAL | 0 refills | Status: DC
  Filled 2023-11-26 – 2023-12-22 (×2): qty 90, 90d supply, fill #0

## 2023-12-01 ENCOUNTER — Other Ambulatory Visit (HOSPITAL_BASED_OUTPATIENT_CLINIC_OR_DEPARTMENT_OTHER): Payer: Self-pay

## 2023-12-07 NOTE — Progress Notes (Unsigned)
 [facility] [address] [place] [address] Phone: [phone] Subjective:   [name] am a scribe for Dr. Claudene.   I'm seeing this patient by the request  of:  Seabron Lenis, MD  CC: right knee pain, left foot pain  YEP:Dlagzrupcz  11/04/2023 Chronic problem with exacerbation.  Discussed icing regimen and home exercises.  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  Instability with valgus and varus force.  Patient is ambulatory abnormal thigh to calf ratio.  Would highly recommend a custom lateral unloading brace with a Tru pull lite to see if this would give her more stability and allow her to be more active.  Patient is only 53 years of age.  Discussed icing regimen and home exercises otherwise.  Discussed the importance of weight loss.  Given an injection today and could be a candidate for viscosupplementation.  Follow-up with me again in 6 to 8 weeks.     Updated 12/10/2023 [name] is a 53 y.o. female coming in with complaint of R knee pain. Patient states wants injection in her foot today. Not going to do the knee today because she got a steroid shot in the knee last month unless Dr. Claudene says otherwise. Shots are really helping.        Past Medical History:  Diagnosis Date   Allergy    seasonal allergies   Anemia    hx of   Anxiety    on meds   Asthma    hx of   Bipolar disorder (HCC)    Cataract    bilateral sx   Chicken pox    Complication of anesthesia    oxygen saturation dropped after hysterectomy 2009 at [facility]   Depression    on meds   Diabetes mellitus    on meds   Dyspnea    GERD (gastroesophageal reflux disease)    hx of   Hyperlipidemia    on meds   Hypertension    readings   Hypothyroidism    hx of-LEFT thyroid  removed   Irregular heartbeat    saw dr Micky sees -cardiology as needed   Migraine    Sleep apnea    uses cpap   Past Surgical History:  Procedure  Laterality Date   ANKLE SURGERY Left 1989   BACK SURGERY  1999   CATARACT EXTRACTION, BILATERAL     CESAREAN SECTION     1995   LAPAROSCOPIC ASSISTED VAGINAL HYSTERECTOMY  2009     BSO fibroids, DUB, pelvic pain   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 REPAIR N/A 04/14/2016   Procedure: LAPAROSCOPIC ROUX-EN-Y GASTRIC BYPASS  WITH UPPER ENDOSCOPY;  Surgeon: Morene Olives, MD;  Location: [facility];  Service: General;  Laterality: N/A;   SHOULDER ARTHROSCOPY WITH OPEN ROTATOR CUFF REPAIR Right 01/02/2017   Procedure: Right shoulder arthroscopy, A-subcromial decompression, mini open rotator cuff repair, open distal clavicle resection, biceps tenodesis;  Surgeon: Kay Kemps, MD;  Location: [facility] OR;  Service: Orthopedics;  Laterality: Right;   THYROIDECTOMY, PARTIAL Left 2001   UMBILICAL HERNIA REPAIR  2003   WISDOM TOOTH EXTRACTION     Social History   Socioeconomic History   Marital status: Married    Spouse name: Not on file   Number of children: 1   Years of education: Not on file   Highest education level: Associate degree: academic program  Occupational History   Occupation: nursing  Tobacco Use  Smoking status: Never   Smokeless tobacco: Never  Vaping Use   Vaping status: Never Used  Substance and Sexual Activity   Alcohol use: No    Alcohol/week: 0.0 standard drinks of alcohol   Drug use: No   Sexual activity: Yes    Partners: Male  Other Topics Concern   Not on file  Social History Narrative   Not on file   Social Drivers of Health   Financial Resource Strain: Low Risk  (12/24/2022)   Overall Financial Resource Strain (CARDIA)    Difficulty of Paying Living Expenses: Not hard at all  Food Insecurity: No Food Insecurity (12/24/2022)   Hunger Vital Sign    Worried About Running Out of Food in the Last Year: Never true    Ran Out of Food in the Last Year: Never true  Transportation Needs: No Transportation Needs (12/24/2022)   PRAPARE -  Administrator, Civil Service (Medical): No    Lack of Transportation (Non-Medical): No  Physical Activity: Unknown (12/24/2022)   Exercise Vital Sign    Days of Exercise per Week: 0 days    Minutes of Exercise per Session: Not on file  Stress: No Stress Concern Present (12/24/2022)   [employer] of Occupational Health - Occupational Stress Questionnaire    Feeling of Stress : Not at all  Social Connections: Moderately Isolated (12/24/2022)   Social Connection and Isolation Panel    Frequency of Communication with Friends and Family: Twice a week    Frequency of Social Gatherings with Friends and Family: Once a week    Attends Religious Services: Never    Database administrator or Organizations: No    Attends Engineer, structural: Not on file    Marital Status: Married   Allergies  Allergen Reactions   Zofran  [Ondansetron ] Other (See Comments)    Severe headaches    Family History  Problem Relation Age of Onset   Hyperlipidemia Mother    Diabetes Mother    Anxiety disorder Mother    Heart disease Mother    Hypertension Father    Lupus Father    Heart disease Father    Asthma Father    Colon polyps Father 15   Stroke Maternal Aunt    Seizures Paternal Grandfather    Stroke Paternal Grandfather    Mental illness Paternal Grandfather    Breast cancer Neg Hx    Colon cancer Neg Hx    Esophageal cancer Neg Hx    Stomach cancer Neg Hx    Rectal cancer Neg Hx     Current Outpatient Medications (Endocrine & Metabolic):    Dulaglutide  (TRULICITY ) 3 MG/0.5ML SOAJ, Inject 3 mg into the skin once a week.   metFORMIN  (GLUCOPHAGE ) 1000 MG tablet, Take 1 tablet (1,000 mg total) by mouth daily with supper.  Current Outpatient Medications (Cardiovascular):    atorvastatin  (LIPITOR) 40 MG tablet, Take 1 tablet (40 mg total) by mouth daily.   lisinopril  (ZESTRIL ) 40 MG tablet, Take 1 tablet (40 mg total) by mouth daily.   spironolactone  (ALDACTONE ) 25 MG  tablet, Take 1 tablet (25 mg total) by mouth daily.  Current Outpatient Medications (Respiratory):    Loratadine (CLARITIN PO), Take by mouth daily.    Current Outpatient Medications (Other):    Biotin 10000 MCG TABS, Take 1 tablet by mouth daily.   Blood Glucose Monitoring Suppl (FREESTYLE LITE) w/Device KIT, Use to check blood sugar 2 times daily   carbamazepine  (TEGRETOL  XR)  200 MG 12 hr tablet, Take 1 tablet (200 mg total) by mouth in the morning AND 2 tablets (400 mg total) at bedtime.   carbamazepine  (TEGRETOL -XR) 200 MG 12 hr tablet, Take 1 tablet (200 mg total) by mouth in the morning and then take 2 tablets (400 mg total) by mouth at bedtime.   carbamazepine  (TEGRETOL -XR) 200 MG 12 hr tablet, Take 1 tablet (200 mg total) by mouth every morning AND 2 tablets (400 mg total) at bedtime.   carbamazepine  (TEGRETOL -XR) 200 MG 12 hr tablet, Take 1 tablet (200 mg total) by mouth in the morning AND 2 tablets (400 mg total) at bedtime.   Continuous Glucose Sensor (DEXCOM G7 SENSOR) MISC, Apply 1 sensor every 10 days as directed.   cyclobenzaprine  (FLEXERIL ) 10 MG tablet, Take 1 tablet (10 mg total) by mouth at bedtime.   cyclobenzaprine  (FLEXERIL ) 10 MG tablet, Take 1 tablet (10 mg total) by mouth at bedtime as needed.   gabapentin  (NEURONTIN ) 800 MG tablet, Take 1 tablet (800 mg total) by mouth 3 (three) times daily.   Glucosamine HCl (GLUCOSAMINE PO), Take 3 tablets by mouth daily.   glucose blood (FREESTYLE LITE) test strip, Use to check blood sugar 2 times daily   lamoTRIgine  (LAMICTAL ) 200 MG tablet, Take 1.5 tablets (300 mg total) by mouth at bedtime.   Lancets (FREESTYLE) lancets, Use as instructed to check sugar 2 times daily   Multiple Vitamin (MULTIVITAMIN) tablet, Take 1 tablet by mouth daily.   TEGRETOL -XR 200 MG 12 hr tablet, Take 1 tablet by mouth every morning and take 2 tablets at bedtime   TEGRETOL -XR 200 MG 12 hr tablet, Take 1 tablet (200 mg total) by mouth in the morning  AND 2 tablets (400 mg total) at bedtime.   TEGRETOL -XR 200 MG 12 hr tablet, Take 1 tablet (200 mg total) by mouth in the morning AND 2 tablets (400 mg total) at bedtime.   TEGRETOL -XR 200 MG 12 hr tablet, Take 1 tablet (200 mg total) by mouth in the morning AND 2 tablets (400 mg total) at bedtime.   terconazole  (TERAZOL 7 ) 0.4 % vaginal cream, Place 1 applicator vaginally at bedtime. (Patient not taking: Reported on 12/08/2023)   tiaGABine  (GABITRIL ) 4 MG tablet, Take 8 mg by mouth 2 (two) times daily.   tiaGABine  (GABITRIL ) 4 MG tablet, Take 1 tablet (4 mg total) by mouth in the morning AND 2 tablets (8 mg total) at bedtime. (Patient taking differently: Take 1 tablet (4 mg total) by mouth in the morning AND 1 tablets (4 mg total) at bedtime.)   tiaGABine  (GABITRIL ) 4 MG tablet, Take 1 tablet (4 mg total) by mouth in the morning and at bedtime.   tiaGABine  (GABITRIL ) 4 MG tablet, Take 1 tablet (4 mg total) by mouth in the morning and at bedtime.   Turmeric (QC TUMERIC COMPLEX PO), Take by mouth daily.   Reviewed prior external information including notes and imaging from  primary care provider As well as notes that were available from care everywhere and other healthcare systems.  Past medical history, social, surgical and family history all reviewed in electronic medical record.  No pertanent information unless stated regarding to the chief complaint.   Review of Systems:  No headache, visual changes, nausea, vomiting, diarrhea, constipation, dizziness, abdominal pain, skin rash, fevers, chills, night sweats, weight loss, swollen lymph nodes, body aches, joint swelling, chest pain, shortness of breath, mood changes. POSITIVE muscle aches  Objective  Last menstrual period 01/25/2008.  General: No apparent distress alert and oriented x3 mood and affect normal, dressed appropriately.  HEENT: Pupils equal, extraocular movements intact  Respiratory: Patient's speak in full sentences and does not  appear short of breath  Cardiovascular: No lower extremity edema, non tender, no erythema  Right knee patient is wearing the OA stability brace.  Seems to be wearing it appropriately. Left foot still has significant breakdown noted.  Significant rigidity of the midfoot.  Nontender to palpation over the third fourth of the metatarsals proximally.  Procedure: Real-time Ultrasound Guided Injection of left medial cuneiform Device: GE Logiq Q7 Ultrasound guided injection is preferred based studies that show increased duration, increased effect, greater accuracy, decreased procedural pain, increased response rate, and decreased cost with ultrasound guided versus blind injection.  Verbal informed consent obtained.  Time-out conducted.  Noted no overlying erythema, induration, or other signs of local infection.  Skin prepped in a sterile fashion.  Local anesthesia: Topical Ethyl chloride.  With sterile technique and under real time ultrasound guidance: With a 25-gauge half inch needle injecting 0.5 cc and 0.5 cc of Kenalog 40 mg/mL Completed without difficulty  Pain immediately resolved suggesting accurate placement of the medication.  Advised to call if fevers/chills, erythema, induration, drainage, or persistent bleeding.  Images saved Impression: Technically successful ultrasound guided injection.    Impression and Recommendations:     The above documentation has been reviewed and is accurate and complete Yanet Balliet M Nia Nathaniel, DO

## 2023-12-08 ENCOUNTER — Ambulatory Visit: Admitting: Internal Medicine

## 2023-12-08 ENCOUNTER — Encounter: Payer: Self-pay | Admitting: Internal Medicine

## 2023-12-08 ENCOUNTER — Other Ambulatory Visit (HOSPITAL_BASED_OUTPATIENT_CLINIC_OR_DEPARTMENT_OTHER): Payer: Self-pay

## 2023-12-08 ENCOUNTER — Telehealth: Payer: Self-pay

## 2023-12-08 VITALS — BP 120/62 | HR 91 | Ht 70.0 in | Wt 292.0 lb

## 2023-12-08 DIAGNOSIS — E119 Type 2 diabetes mellitus without complications: Secondary | ICD-10-CM | POA: Diagnosis not present

## 2023-12-08 DIAGNOSIS — Z7984 Long term (current) use of oral hypoglycemic drugs: Secondary | ICD-10-CM

## 2023-12-08 DIAGNOSIS — E782 Mixed hyperlipidemia: Secondary | ICD-10-CM | POA: Diagnosis not present

## 2023-12-08 DIAGNOSIS — Z7985 Long-term (current) use of injectable non-insulin antidiabetic drugs: Secondary | ICD-10-CM

## 2023-12-08 LAB — POCT GLYCOSYLATED HEMOGLOBIN (HGB A1C): Hemoglobin A1C: 7.2 % — AB (ref 4.0–5.6)

## 2023-12-08 MED ORDER — METFORMIN HCL 1000 MG PO TABS
1000.0000 mg | ORAL_TABLET | Freq: Every day | ORAL | 3 refills | Status: AC
  Filled 2023-12-08 – 2024-02-26 (×3): qty 90, 90d supply, fill #0
  Filled 2024-05-19: qty 90, 90d supply, fill #1

## 2023-12-08 MED ORDER — DEXCOM G7 SENSOR MISC
3.0000 | 4 refills | Status: AC
  Filled 2023-12-08: qty 9, 90d supply, fill #0
  Filled 2024-03-01: qty 9, 90d supply, fill #1
  Filled 2024-05-30: qty 9, 90d supply, fill #2

## 2023-12-08 MED ORDER — TRULICITY 3 MG/0.5ML ~~LOC~~ SOAJ
3.0000 mg | SUBCUTANEOUS | 3 refills | Status: AC
  Filled 2023-12-08 – 2023-12-23 (×2): qty 6, 84d supply, fill #0
  Filled 2024-03-16: qty 6, 84d supply, fill #1

## 2023-12-08 NOTE — Progress Notes (Signed)
 Patient ID: [name], female   DOB: 11-Nov-1970, 53 y.o.   MRN: [id number]  HPI: [name] is a 53 y.o.-year-old female, presenting for f/u for DM2, dx in 2001, non-insulin -dependent, uncontrolled, with aortic atherosclerosis, diabetic retinopathy, peripheral neuropathy. Last visit 8 months ago.  Interim history: No increased urination, blurry vision, nausea, chest pain.   In last month, she and her husband started to improve diet, cut down sweets. They are also walking.  She is getting steroid inj. in feet >> sugars did not increased significantly.  Reviewed HbA1c levels: Lab Results  Component Value Date   HGBA1C 7.0 (H) 03/05/2023   HGBA1C 9.1 (A) 09/19/2022   HGBA1C 8.1 (A) 02/27/2022   HGBA1C 8.4 (A) 10/01/2021   HGBA1C 9.7 (A) 06/13/2021   HGBA1C 6.4 (A) 12/11/2020   HGBA1C 6.6 (A) 08/16/2020   HGBA1C 8.1 (A) 12/15/2019   HGBA1C 7.6 (H) 07/12/2019   HGBA1C 6.7 (A) 11/30/2018   HGBA1C 8.5 (A) 07/26/2018   HGBA1C 9.9 (H) 04/15/2018   HGBA1C 7.6 08/03/2017   HGBA1C 6.6 (H) 12/30/2016   HGBA1C 6.1 (H) 10/09/2016   HGBA1C 9.6 02/21/2016   HGBA1C 9.2 10/05/2015   HGBA1C 10.4 05/31/2015   HGBA1C 8.8 01/09/2015   HGBA1C 11.0 (H) 06/21/2014   Pt was on a regimen of: - Metformin  2000 mg at dinnertime - Lantus  63 units at bedtime - Victoza  1.8 mg daily - Amaryl  4 mg 2x a day She had frequent yeast inf when sugars were higher before.   Pt is now on a regimen of: - Metformin  500 mg 2x a day >> 1000 mg with dinner >> 1000 mg 2x a day - Trulicity  -initially had nausea, then resolved: 1.5 >> 3 mg weekly >> restarted 08/2022 She had yeast inf from Farxiga , despite Terconazole .  She is checking sugars seldom: - am: 138-196, 216 >> 176-276 >> 150-160 >> n/c - 2h after b'fast: 105-161 >> 177, 240 >> n/c - before lunch: 125-130s >> 96-130 >> 202-267 >> 88-113 - 2h after lunch: 111-157 >> 270 >> up to 240>> 90 - before dinner: n/c >> 192 >> 164, 202 >> 130-140 >>  n/c - 2h after dinner: 8 142, 160 >> 276  >> 180-200 >> n/c - bedtime:  91-133 >> 150s, occas. 200 >> n/c - nighttime: n/c Lowest sugar was 34 (after Sx - while still on insulin ) >> ... 96 > 164 >> 130 >> 88; she has hypoglycemia awareness in the 90s. Highest sugar was 330 >> ... 276 >> 240>> 113.  Glucometer: One Touch Ultra 2 >> FreeStyle Lite   -No CKD, last BUN/creatinine:  Lab Results  Component Value Date   BUN 12 03/05/2023   CREATININE 0.60 10/16/2023   No results found for: MICRALBCREAT On lisinopril  20.  -+ HL; last set of lipids: Lab Results  Component Value Date   CHOL 172 03/05/2023   HDL 58.60 03/05/2023   LDLCALC 88 03/05/2023   LDLDIRECT 112.0 06/21/2014   TRIG 127.0 03/05/2023   CHOLHDL 3 03/05/2023  On Lipitor 40.  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  She had cataracts but is status post cataract surgery.  Dr. Robinson.  She sees a retina specialist.  - + numbness and tingling in her feet -peripheral neuropathy per nerve conduction study..  Last foot exam 08/2022. She sees Dr. Skeet -on Neurontin .   She had Roux-en-Y gastric bypass surgery in 03/2016.  After this, she lost more  than 100 pounds but she gained more than 50 pounds back afterwards. She had a syncopal episode in [place]  in 09/2020, after staying in the sun for 3 hours.  At that time, she went to the emergency room.  She was given IV fluids.  EKG was normal.  HCTZ was stopped and lisinopril  was increased.  Next day, she tested positive for COVID.  This was complicated by bronchitis > had ABx. She had a previous syncopal episode in [place] in 03/2020.  ROS: + see HPI  I reviewed pt's medications, allergies, PMH, social hx, family hx, and changes were documented in the history of present illness. Otherwise, unchanged from my initial visit note.  Past Medical History:  Diagnosis Date   Allergy    seasonal allergies   Anemia    hx of   Anxiety    on meds    Asthma    hx of   Bipolar disorder (HCC)    Cataract    bilateral sx   Chicken pox    Complication of anesthesia    oxygen saturation dropped after hysterectomy 2009 at [facility]   Depression    on meds   Diabetes mellitus    on meds   Dyspnea    GERD (gastroesophageal reflux disease)    hx of   Hyperlipidemia    on meds   Hypertension    readings   Hypothyroidism    hx of-LEFT thyroid  removed   Irregular heartbeat    saw dr Micky sees -cardiology as needed   Migraine    Sleep apnea    uses cpap   Past Surgical History:  Procedure Laterality Date   ANKLE SURGERY Left 1989   BACK SURGERY  1999   CATARACT EXTRACTION, BILATERAL     CESAREAN SECTION     1995   LAPAROSCOPIC ASSISTED VAGINAL HYSTERECTOMY  2009     BSO fibroids, DUB, pelvic pain   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 REPAIR N/A 04/14/2016   Procedure: LAPAROSCOPIC ROUX-EN-Y GASTRIC BYPASS  WITH UPPER ENDOSCOPY;  Surgeon: Morene Olives, MD;  Location: [facility] ORS;  Service: General;  Laterality: N/A;   SHOULDER ARTHROSCOPY WITH OPEN ROTATOR CUFF REPAIR Right 01/02/2017   Procedure: Right shoulder arthroscopy, A-subcromial decompression, mini open rotator cuff repair, open distal clavicle resection, biceps tenodesis;  Surgeon: Kay Kemps, MD;  Location: [facility] OR;  Service: Orthopedics;  Laterality: Right;   THYROIDECTOMY, PARTIAL Left 2001   UMBILICAL HERNIA REPAIR  2003   WISDOM TOOTH EXTRACTION     Social History   Social History   Marital Status: Married    Spouse Name: N/A   Number of Children: 1   Occupational History   Passenger transport manager, Administrator   Social History Main Topics   Smoking status: Never Smoker    Smokeless tobacco: Never Used   Alcohol Use: No   Drug Use: No   Current Outpatient Medications on File Prior to Visit  Medication Sig Dispense Refill   atorvastatin  (LIPITOR) 40 MG tablet Take 1 tablet (40 mg total) by mouth daily. 90 tablet 1   Biotin 10000 MCG TABS  Take 1 tablet by mouth daily.     Blood Glucose Monitoring Suppl (FREESTYLE LITE) w/Device KIT Use to check blood sugar 2 times daily 1 kit 0   carbamazepine  (TEGRETOL  XR) 200 MG 12 hr tablet Take 1 tablet (200 mg total) by mouth in the morning AND 2 tablets (400 mg total) at bedtime. 270 tablet 0  carbamazepine  (TEGRETOL -XR) 200 MG 12 hr tablet Take 1 tablet (200 mg total) by mouth in the morning and then take 2 tablets (400 mg total) by mouth at bedtime. 270 tablet 0   carbamazepine  (TEGRETOL -XR) 200 MG 12 hr tablet Take 1 tablet (200 mg total) by mouth every morning AND 2 tablets (400 mg total) at bedtime. 270 tablet 0   carbamazepine  (TEGRETOL -XR) 200 MG 12 hr tablet Take 1 tablet (200 mg total) by mouth in the morning AND 2 tablets (400 mg total) at bedtime. 270 tablet 0   cyclobenzaprine  (FLEXERIL ) 10 MG tablet Take 1 tablet (10 mg total) by mouth at bedtime. 30 tablet 3   cyclobenzaprine  (FLEXERIL ) 10 MG tablet Take 1 tablet (10 mg total) by mouth at bedtime as needed. 90 tablet 1   Dulaglutide  (TRULICITY ) 3 MG/0.5ML SOAJ Inject 3 mg into the skin once a week. 6 mL 3   gabapentin  (NEURONTIN ) 800 MG tablet Take 1 tablet (800 mg total) by mouth 3 (three) times daily. 90 tablet 5   Glucosamine HCl (GLUCOSAMINE PO) Take 3 tablets by mouth daily.     glucose blood (FREESTYLE LITE) test strip Use to check blood sugar 2 times daily 100 each 3   lamoTRIgine  (LAMICTAL ) 200 MG tablet Take 1.5 tablets (300 mg total) by mouth at bedtime. 135 tablet 0   Lancets (FREESTYLE) lancets Use as instructed to check sugar 2 times daily 200 each 5   lisinopril  (ZESTRIL ) 40 MG tablet Take 1 tablet (40 mg total) by mouth daily. 90 tablet 0   Loratadine (CLARITIN PO) Take by mouth daily.     metFORMIN  (GLUCOPHAGE ) 1000 MG tablet Take 1 tablet (1,000 mg total) by mouth 2 (two) times daily with a meal. 180 tablet 3   Multiple Vitamin (MULTIVITAMIN) tablet Take 1 tablet by mouth daily.     spironolactone  (ALDACTONE ) 25  MG tablet Take 1 tablet (25 mg total) by mouth daily. 90 tablet 1   TEGRETOL -XR 200 MG 12 hr tablet Take 1 tablet by mouth every morning and take 2 tablets at bedtime 270 tablet 0   TEGRETOL -XR 200 MG 12 hr tablet Take 1 tablet (200 mg total) by mouth in the morning AND 2 tablets (400 mg total) at bedtime. 270 tablet 0   TEGRETOL -XR 200 MG 12 hr tablet Take 1 tablet (200 mg total) by mouth in the morning AND 2 tablets (400 mg total) at bedtime. 270 tablet 0   TEGRETOL -XR 200 MG 12 hr tablet Take 1 tablet (200 mg total) by mouth in the morning AND 2 tablets (400 mg total) at bedtime. 270 tablet 0   terconazole  (TERAZOL 7 ) 0.4 % vaginal cream Place 1 applicator vaginally at bedtime. 45 g 0   tiaGABine  (GABITRIL ) 4 MG tablet Take 8 mg by mouth 2 (two) times daily.     tiaGABine  (GABITRIL ) 4 MG tablet Take 1 tablet (4 mg total) by mouth in the morning AND 2 tablets (8 mg total) at bedtime. (Patient taking differently: Take 1 tablet (4 mg total) by mouth in the morning AND 1 tablets (4 mg total) at bedtime.) 270 tablet 0   tiaGABine  (GABITRIL ) 4 MG tablet Take 1 tablet (4 mg total) by mouth in the morning and at bedtime. 180 tablet 0   tiaGABine  (GABITRIL ) 4 MG tablet Take 1 tablet (4 mg total) by mouth in the morning and at bedtime. 180 tablet 0   Turmeric (QC TUMERIC COMPLEX PO) Take by mouth daily.  No current facility-administered medications on file prior to visit.   Allergies  Allergen Reactions   Zofran  [Ondansetron ] Other (See Comments)    Severe headaches    Family History  Problem Relation Age of Onset   Hyperlipidemia Mother    Diabetes Mother    Anxiety disorder Mother    Heart disease Mother    Hypertension Father    Lupus Father    Heart disease Father    Asthma Father    Colon polyps Father 55   Stroke Maternal Aunt    Seizures Paternal Grandfather    Stroke Paternal Grandfather    Mental illness Paternal Grandfather    Breast cancer Neg Hx    Colon cancer Neg Hx     Esophageal cancer Neg Hx    Stomach cancer Neg Hx    Rectal cancer Neg Hx    PE: BP 120/62   Pulse 91   Ht 5' 10 (1.778 m)   Wt 292 lb (132.5 kg)   LMP 01/25/2008   SpO2 97%   BMI 41.90 kg/m  Wt Readings from Last 10 Encounters:  12/08/23 292 lb (132.5 kg)  11/04/23 298 lb (135.2 kg)  10/08/23 294 lb (133.4 kg)  09/17/23 288 lb (130.6 kg)  06/25/23 290 lb (131.5 kg)  06/24/23 291 lb (132 kg)  04/09/23 287 lb (130.2 kg)  03/12/23 290 lb 6.4 oz (131.7 kg)  03/05/23 286 lb (129.7 kg)  02/27/23 289 lb (131.1 kg)   Constitutional: overweight, in NAD Eyes:  EOMI, no exophthalmos ENT: no neck masses, no cervical lymphadenopathy Cardiovascular: RRR, No MRG Respiratory: CTA B Musculoskeletal: no deformities Skin:no rashes Neurological: no tremor with outstretched hands Diabetic Foot Exam - Simple   Simple Foot Form Diabetic Foot exam was performed with the following findings: Yes 12/08/2023  3:01 PM  Visual Inspection No deformities, no ulcerations, no other skin breakdown bilaterally: Yes Sensation Testing Intact to touch and monofilament testing bilaterally: Yes Pulse Check Posterior Tibialis and Dorsalis pulse intact bilaterally: Yes Comments    ASSESSMENT: 1. DM2, insulin -independent, uncontrolled, with complications - aortic atherosclerosis - per CT 09/2023 - DR - Peripheral neuropathy  2. Obesity class III  3. HL  PLAN:  1. Patient with longstanding, uncontrolled, type 2 diabetes, on metformin , and weekly GLP-1 receptor agonist currently, with initial improvement after her gastric bypass surgery in 2017 and losing more than 100 pounds.  Afterwards, she gained some of the weight back and HbA1c increased.  Sugars were better at last visit, but they were still elevated in the morning and after certain meals.  She did start to make changes in her diet to reduce carbs and takeout.  At that point, I suggested to start an SGLT2 inhibitor (Farxiga ) and continued metformin   and Trulicity .  Of note, she started Farxiga  but had bothersome, recurrent, yeast infections, despite terconazole , so she had to stop. - At today's visit, she tells me that she was not checking sugars and watching her diet until approximately a month ago, after she got the results back of her CT scan that showed aortic atherosclerosis.  She and her husband started to eliminate carbs and also they started to walk.  She also started to check blood sugars and they are at goal.  However, we only have 4 values to review, all from the last 2 weeks.  As she mentions that she feels that her sugars are dropping too low, I did recommend to decrease the metformin  dose for now.  For  now, we will try to continue the same dose of Trulicity , to further help with her weight.  She did lose 6 pounds in the last 2 months.  She also finally agreed to start the CGM -Dexcom G7, just like her husband's.  I sent a prescription to her pharmacy. - I advised her to:  Patient Instructions  Please reduce: - Metformin  1000 mg with dinner  Continue: - Trulicity  3 mg weekly  Please start the sensor.  Please come back for a follow-up appointment in 4 months.  - we checked her HbA1c: 7.2% (higher) - advised to check sugars at different times of the day - 4x a day, rotating check times - advised for yearly eye exams >> she is UTD - wil check an ACR todayl - return to clinic in 3-4 months    2. Obesity class III -She has a history of Roux-en-Y gastric bypass surgery, after which she lost a significant amount of weight but she started to gain weight back afterwards.   -Continues on Trulicity  which should also help with weight loss - At last visit, I also recommended Farxiga  to further help with weight loss.  She started this but had recurrent yeast infections, so she had to stop.  3. HL - Latest lipid panel was at goal except for an LDL above our target of less than 70 due to presence of diabetic retinopathy Lab Results   Component Value Date   CHOL 172 03/05/2023   HDL 58.60 03/05/2023   LDLCALC 88 03/05/2023   LDLDIRECT 112.0 06/21/2014   TRIG 127.0 03/05/2023   CHOLHDL 3 03/05/2023  -She continues Lipitor 40 mg daily.  She previously had muscle cramps, but she did not feel that these were related to Lipitor.  Lela Fendt, MD PhD [facility]

## 2023-12-08 NOTE — Telephone Encounter (Signed)
 Pharmacy Patient Advocate Encounter   Received notification from CoverMyMeds that prior authorization for Dexcom G7 sensor is required/requested.   Insurance verification completed.   The patient is insured through [facility] .   Per test claim: PA required; PA submitted to above mentioned insurance via CoverMyMeds Key/confirmation #/EOC [id number] Status is pending

## 2023-12-08 NOTE — Patient Instructions (Addendum)
 Please reduce: - Metformin  1000 mg with dinner  Continue: - Trulicity  3 mg weekly  Please start the sensor.  Please come back for a follow-up appointment in 4 months.

## 2023-12-09 ENCOUNTER — Other Ambulatory Visit (HOSPITAL_BASED_OUTPATIENT_CLINIC_OR_DEPARTMENT_OTHER): Payer: Self-pay

## 2023-12-09 ENCOUNTER — Ambulatory Visit: Payer: Self-pay | Admitting: Internal Medicine

## 2023-12-09 LAB — MICROALBUMIN / CREATININE URINE RATIO
Creatinine, Urine: 60 mg/dL (ref 20–275)
Microalb Creat Ratio: 12 mg/g{creat} (ref <30)
Microalb, Ur: 0.7 mg/dL

## 2023-12-09 NOTE — Telephone Encounter (Signed)
 Pharmacy Patient Advocate Encounter  Received notification from [facility] that Prior Authorization for Dexcom G7 sensor has been APPROVED from 12/08/23 to 12/07/24   PA #/Case ID/Reference #: [id number]

## 2023-12-10 ENCOUNTER — Telehealth: Payer: Self-pay | Admitting: Cardiovascular Disease

## 2023-12-10 ENCOUNTER — Encounter: Payer: Self-pay | Admitting: Family Medicine

## 2023-12-10 ENCOUNTER — Other Ambulatory Visit: Payer: Self-pay

## 2023-12-10 ENCOUNTER — Ambulatory Visit: Admitting: Family Medicine

## 2023-12-10 VITALS — BP 148/80 | HR 78 | Ht 70.0 in

## 2023-12-10 DIAGNOSIS — M79672 Pain in left foot: Secondary | ICD-10-CM | POA: Diagnosis not present

## 2023-12-10 DIAGNOSIS — M1711 Unilateral primary osteoarthritis, right knee: Secondary | ICD-10-CM | POA: Diagnosis not present

## 2023-12-10 DIAGNOSIS — M19072 Primary osteoarthritis, left ankle and foot: Secondary | ICD-10-CM

## 2023-12-10 NOTE — Telephone Encounter (Signed)
 Patient reports she has had episodes of lightheadedness for the past year, but these episodes have gotten worse over the past 2 weeks. BP 121/72, HR 70. She reports HR gets up to 100 when she has episodes of lightheadedness.  She reports these occur in the mornings after she showers and is standing in the bathroom getting ready for work, also sometimes when she is in the grocery store she will need to sit down to let the lightheadedness pass.  Patient reports she is getting more fatigued as this goes on and does have some SOB with these episodes. She denies passing out. She states she is staying hydrated.   Encouraged patient to continue to stay hydrated and try wearing compression socks to see if this helps alleviate episodes of lightheadedness when standing. Patient has appt with APP on 8/7, encouraged patient to keep this appt for further evaluation.  Will forward to Dr. Barbaraann to review.

## 2023-12-10 NOTE — Patient Instructions (Signed)
 Hot glue gun Glad brace is working good Hold on gel See me in 3 months

## 2023-12-10 NOTE — Assessment & Plan Note (Signed)
 Injection given in the middle and lateral cuneiform joint space.  Patient responded well.  Discussed icing regimen and home exercises.  Discussed proper shoes and wearing the custom orthotics.  Wearing the recovery sandals in the house.  Follow-up again 3 months

## 2023-12-10 NOTE — Assessment & Plan Note (Signed)
 Known arthritic changes but responded extremely well to the injections.  Will discuss with patient about wearing the brace and other things she can do to make it ergonomically more fitting.  Consider the possibility of viscosupplementation which patient is approved for but patient would like to hold on it at this point.  Will follow-up with me again in 8 to 10 weeks.

## 2023-12-10 NOTE — Telephone Encounter (Signed)
 STAT if patient feels like he/she is going to faint   Are you dizzy, lightheaded, or faint now? No   Have you passed out? No  - IF YES MOVE TO .SYNCOPECVD  Do you have any other symptoms? Fatigue   Have you checked your HR and BP (record if available)? 121/72 hr 70. Pt states when she feels lightheaded her hr goes up to 100  Scheduled for  f/u 12/31/23

## 2023-12-11 ENCOUNTER — Inpatient Hospital Stay (HOSPITAL_BASED_OUTPATIENT_CLINIC_OR_DEPARTMENT_OTHER)
Admission: EM | Admit: 2023-12-11 | Discharge: 2023-12-14 | DRG: 378 | Disposition: A | Discharge status: Home / Self Care | Attending: Internal Medicine | Admitting: Internal Medicine

## 2023-12-11 ENCOUNTER — Other Ambulatory Visit: Payer: Self-pay

## 2023-12-11 ENCOUNTER — Emergency Department (HOSPITAL_BASED_OUTPATIENT_CLINIC_OR_DEPARTMENT_OTHER)

## 2023-12-11 ENCOUNTER — Encounter (HOSPITAL_BASED_OUTPATIENT_CLINIC_OR_DEPARTMENT_OTHER): Payer: Self-pay

## 2023-12-11 DIAGNOSIS — Z791 Long term (current) use of non-steroidal anti-inflammatories (NSAID): Secondary | ICD-10-CM

## 2023-12-11 DIAGNOSIS — Z79899 Other long term (current) drug therapy: Secondary | ICD-10-CM

## 2023-12-11 DIAGNOSIS — Z8249 Family history of ischemic heart disease and other diseases of the circulatory system: Secondary | ICD-10-CM

## 2023-12-11 DIAGNOSIS — E782 Mixed hyperlipidemia: Secondary | ICD-10-CM | POA: Diagnosis present

## 2023-12-11 DIAGNOSIS — Z818 Family history of other mental and behavioral disorders: Secondary | ICD-10-CM

## 2023-12-11 DIAGNOSIS — R42 Dizziness and giddiness: Secondary | ICD-10-CM

## 2023-12-11 DIAGNOSIS — I10 Essential (primary) hypertension: Secondary | ICD-10-CM | POA: Diagnosis present

## 2023-12-11 DIAGNOSIS — Z888 Allergy status to other drugs, medicaments and biological substances status: Secondary | ICD-10-CM

## 2023-12-11 DIAGNOSIS — K922 Gastrointestinal hemorrhage, unspecified: Secondary | ICD-10-CM | POA: Diagnosis present

## 2023-12-11 DIAGNOSIS — I872 Venous insufficiency (chronic) (peripheral): Secondary | ICD-10-CM | POA: Diagnosis present

## 2023-12-11 DIAGNOSIS — K254 Chronic or unspecified gastric ulcer with hemorrhage: Principal | ICD-10-CM | POA: Diagnosis present

## 2023-12-11 DIAGNOSIS — E1140 Type 2 diabetes mellitus with diabetic neuropathy, unspecified: Secondary | ICD-10-CM | POA: Diagnosis present

## 2023-12-11 DIAGNOSIS — D539 Nutritional anemia, unspecified: Secondary | ICD-10-CM

## 2023-12-11 DIAGNOSIS — Z9884 Bariatric surgery status: Secondary | ICD-10-CM

## 2023-12-11 DIAGNOSIS — E871 Hypo-osmolality and hyponatremia: Principal | ICD-10-CM

## 2023-12-11 DIAGNOSIS — Z7984 Long term (current) use of oral hypoglycemic drugs: Secondary | ICD-10-CM

## 2023-12-11 DIAGNOSIS — R55 Syncope and collapse: Secondary | ICD-10-CM | POA: Diagnosis not present

## 2023-12-11 DIAGNOSIS — G43909 Migraine, unspecified, not intractable, without status migrainosus: Secondary | ICD-10-CM | POA: Diagnosis present

## 2023-12-11 DIAGNOSIS — R5383 Other fatigue: Secondary | ICD-10-CM | POA: Diagnosis not present

## 2023-12-11 DIAGNOSIS — J45909 Unspecified asthma, uncomplicated: Secondary | ICD-10-CM | POA: Diagnosis present

## 2023-12-11 DIAGNOSIS — D509 Iron deficiency anemia, unspecified: Secondary | ICD-10-CM | POA: Diagnosis present

## 2023-12-11 DIAGNOSIS — T39395A Adverse effect of other nonsteroidal anti-inflammatory drugs [NSAID], initial encounter: Secondary | ICD-10-CM | POA: Diagnosis present

## 2023-12-11 DIAGNOSIS — F319 Bipolar disorder, unspecified: Secondary | ICD-10-CM | POA: Diagnosis present

## 2023-12-11 DIAGNOSIS — Z7985 Long-term (current) use of injectable non-insulin antidiabetic drugs: Secondary | ICD-10-CM

## 2023-12-11 DIAGNOSIS — Z83438 Family history of other disorder of lipoprotein metabolism and other lipidemia: Secondary | ICD-10-CM

## 2023-12-11 DIAGNOSIS — E119 Type 2 diabetes mellitus without complications: Secondary | ICD-10-CM

## 2023-12-11 DIAGNOSIS — E878 Other disorders of electrolyte and fluid balance, not elsewhere classified: Secondary | ICD-10-CM | POA: Diagnosis present

## 2023-12-11 DIAGNOSIS — R531 Weakness: Secondary | ICD-10-CM | POA: Diagnosis not present

## 2023-12-11 DIAGNOSIS — Z833 Family history of diabetes mellitus: Secondary | ICD-10-CM

## 2023-12-11 DIAGNOSIS — Z90710 Acquired absence of both cervix and uterus: Secondary | ICD-10-CM

## 2023-12-11 DIAGNOSIS — Z825 Family history of asthma and other chronic lower respiratory diseases: Secondary | ICD-10-CM

## 2023-12-11 DIAGNOSIS — K219 Gastro-esophageal reflux disease without esophagitis: Secondary | ICD-10-CM | POA: Diagnosis present

## 2023-12-11 DIAGNOSIS — F0393 Unspecified dementia, unspecified severity, with mood disturbance: Secondary | ICD-10-CM | POA: Diagnosis present

## 2023-12-11 DIAGNOSIS — R002 Palpitations: Secondary | ICD-10-CM | POA: Diagnosis not present

## 2023-12-11 DIAGNOSIS — D649 Anemia, unspecified: Secondary | ICD-10-CM

## 2023-12-11 DIAGNOSIS — G4733 Obstructive sleep apnea (adult) (pediatric): Secondary | ICD-10-CM | POA: Diagnosis present

## 2023-12-11 DIAGNOSIS — G8929 Other chronic pain: Secondary | ICD-10-CM | POA: Diagnosis present

## 2023-12-11 DIAGNOSIS — E039 Hypothyroidism, unspecified: Secondary | ICD-10-CM | POA: Diagnosis present

## 2023-12-11 DIAGNOSIS — Z6841 Body Mass Index (BMI) 40.0 and over, adult: Secondary | ICD-10-CM

## 2023-12-11 DIAGNOSIS — K2970 Gastritis, unspecified, without bleeding: Secondary | ICD-10-CM | POA: Diagnosis present

## 2023-12-11 DIAGNOSIS — D62 Acute posthemorrhagic anemia: Secondary | ICD-10-CM | POA: Diagnosis present

## 2023-12-11 DIAGNOSIS — I499 Cardiac arrhythmia, unspecified: Secondary | ICD-10-CM | POA: Diagnosis present

## 2023-12-11 DIAGNOSIS — F0394 Unspecified dementia, unspecified severity, with anxiety: Secondary | ICD-10-CM | POA: Diagnosis present

## 2023-12-11 DIAGNOSIS — Z7982 Long term (current) use of aspirin: Secondary | ICD-10-CM

## 2023-12-11 DIAGNOSIS — E222 Syndrome of inappropriate secretion of antidiuretic hormone: Secondary | ICD-10-CM | POA: Diagnosis present

## 2023-12-11 LAB — CBC WITH DIFFERENTIAL/PLATELET
Abs Immature Granulocytes: 0.05 K/uL (ref 0.00–0.07)
Basophils Absolute: 0.1 K/uL (ref 0.0–0.1)
Basophils Relative: 1 %
Eosinophils Absolute: 0.7 K/uL — ABNORMAL HIGH (ref 0.0–0.5)
Eosinophils Relative: 8 %
HCT: 27.9 % — ABNORMAL LOW (ref 36.0–46.0)
Hemoglobin: 9.6 g/dL — ABNORMAL LOW (ref 12.0–15.0)
Immature Granulocytes: 1 %
Lymphocytes Relative: 25 %
Lymphs Abs: 2.1 K/uL (ref 0.7–4.0)
MCH: 30.5 pg (ref 26.0–34.0)
MCHC: 34.4 g/dL (ref 30.0–36.0)
MCV: 88.6 fL (ref 80.0–100.0)
Monocytes Absolute: 0.7 K/uL (ref 0.1–1.0)
Monocytes Relative: 8 %
Neutro Abs: 5 K/uL (ref 1.7–7.7)
Neutrophils Relative %: 57 %
Platelets: 296 K/uL (ref 150–400)
RBC: 3.15 MIL/uL — ABNORMAL LOW (ref 3.87–5.11)
RDW: 11.9 % (ref 11.5–15.5)
WBC: 8.7 K/uL (ref 4.0–10.5)
nRBC: 0 % (ref 0.0–0.2)

## 2023-12-11 LAB — COMPREHENSIVE METABOLIC PANEL WITH GFR
ALT: 26 U/L (ref 0–44)
AST: 31 U/L (ref 15–41)
Albumin: 4.1 g/dL (ref 3.5–5.0)
Alkaline Phosphatase: 93 U/L (ref 38–126)
Anion gap: 13 (ref 5–15)
BUN: 13 mg/dL (ref 6–20)
CO2: 21 mmol/L — ABNORMAL LOW (ref 22–32)
Calcium: 8.6 mg/dL — ABNORMAL LOW (ref 8.9–10.3)
Chloride: 88 mmol/L — ABNORMAL LOW (ref 98–111)
Creatinine, Ser: 0.54 mg/dL (ref 0.44–1.00)
GFR, Estimated: >60 mL/min (ref >60)
Glucose, Bld: 163 mg/dL — ABNORMAL HIGH (ref 70–99)
Potassium: 4.4 mmol/L (ref 3.5–5.1)
Sodium: 123 mmol/L — ABNORMAL LOW (ref 135–145)
Total Bilirubin: <0.2 mg/dL (ref 0.0–1.2)
Total Protein: 6.5 g/dL (ref 6.5–8.1)

## 2023-12-11 LAB — URINALYSIS, ROUTINE W REFLEX MICROSCOPIC
Bilirubin Urine: NEGATIVE
Glucose, UA: NEGATIVE mg/dL
Hgb urine dipstick: NEGATIVE
Ketones, ur: NEGATIVE mg/dL
Nitrite: NEGATIVE
Protein, ur: NEGATIVE mg/dL
Specific Gravity, Urine: 1.015 (ref 1.005–1.030)
pH: 7 (ref 5.0–8.0)

## 2023-12-11 LAB — URINALYSIS, MICROSCOPIC (REFLEX)

## 2023-12-11 LAB — RETICULOCYTES
Immature Retic Fract: 19.3 % — ABNORMAL HIGH (ref 2.3–15.9)
RBC.: 3.18 MIL/uL — ABNORMAL LOW (ref 3.87–5.11)
Retic Count, Absolute: 72.4 K/uL (ref 19.0–186.0)
Retic Ct Pct: 2.3 % (ref 0.4–3.1)

## 2023-12-11 LAB — TROPONIN T, HIGH SENSITIVITY: Troponin T High Sensitivity: <15 ng/L (ref <19)

## 2023-12-11 LAB — OCCULT BLOOD X 1 CARD TO LAB, STOOL: Fecal Occult Bld: POSITIVE — AB

## 2023-12-11 MED ORDER — SODIUM CHLORIDE 0.9 % IV BOLUS
500.0000 mL | Freq: Once | INTRAVENOUS | Status: AC
  Administered 2023-12-11: 500 mL via INTRAVENOUS

## 2023-12-11 MED ORDER — PANTOPRAZOLE SODIUM 40 MG IV SOLR
40.0000 mg | Freq: Once | INTRAVENOUS | Status: AC
  Administered 2023-12-11: 40 mg via INTRAVENOUS
  Filled 2023-12-11: qty 10

## 2023-12-11 NOTE — ED Triage Notes (Signed)
 Pt referred to the ED by her PCP. She has been having progressive weakness and fatigue for two weeks and her PCP obtained labs. Pt was notified of a low sodium and low iron. Denies ShOB, CP.

## 2023-12-11 NOTE — Plan of Care (Signed)
 53 yo F w/ symptomatic anemia (Hgb 9 w/ baseline 13) heme positive on stool study and Hyponatremia to 123 on Spironolactone . Hemodynamically stable. 2 wks of progressive fatigue. Dr Avram consulted by ED and requesting admission. Will make  NPO after midnight. IVF started by EDP.   Alm JINNY Heads, MD [facility]

## 2023-12-11 NOTE — ED Provider Notes (Addendum)
 [place] [facility] AT [facility] Provider Note  CSN: [id number] Arrival date & time: 12/11/23 1754  Chief Complaint(s) Weakness (Low Sodium )  HPI [name] is a 53 y.o. female with past medical history as below, significant for bipolar disorder, HLD, HTN, hypothyroid bariatric surgery, DM2, OSA who presents to the ED with complaint of fatigue, lightheadedness, low sodium  Patient reports been feeling fatigue over the past 2 or 3 weeks, lightheadedness with standing at times.  Exercise intolerance.  [name] was seen by PCP and had labs drawn and was told to come to the ER secondary to her low sodium.  [name] denies any vomiting or diarrhea, no excessive water  intake, no recent medication or diet changes overall.  No chest pain, dyspnea, abdominal pain.  [name] reports no some dark-colored stool few days ago x 1 but has since had brown-colored stool output.  Follows with [facility], [name] had colonoscopy around 3 years ago which was normal per patient  Past Medical History Past Medical History:  Diagnosis Date   Allergy    seasonal allergies   Anemia    hx of   Anxiety    on meds   Asthma    hx of   Bipolar disorder (HCC)    Cataract    bilateral sx   Chicken pox    Complication of anesthesia    oxygen saturation dropped after hysterectomy 2009 at [facility]   Depression    on meds   Diabetes mellitus    on meds   Dyspnea    GERD (gastroesophageal reflux disease)    hx of   Hyperlipidemia    on meds   Hypertension    readings   Hypothyroidism    hx of-LEFT thyroid  removed   Irregular heartbeat    saw dr Micky sees -cardiology as needed   Migraine    Sleep apnea    uses cpap   Patient Active Problem List   Diagnosis Date Noted   Degenerative arthritis of right knee 11/04/2023   Arthritis of left foot 06/25/2023   Metatarsal stress fracture of left foot 01/30/2023   Leg cramps 08/28/2022   DDD (degenerative disc disease), lumbar 02/27/2022    Heartburn 11/19/2020   Lumbar pain 03/08/2020   Palpitations 01/13/2020   S/P bariatric surgery 07/12/2019   Type 2 diabetes mellitus without complications (HCC) 10/05/2015   Hypersomnia with sleep apnea 06/20/2015   Right lumbar radiculopathy 03/13/2014   Lichen simplex chronicus 09/21/2013   Benign paroxysmal positional vertigo 02/03/2013   General medical examination 08/06/2011   GERD 08/22/2008   HYPERLIPIDEMIA, MIXED 05/25/2007   Morbid obesity (HCC) 05/25/2007   Bipolar disorder (HCC) 05/25/2007   Migraine headache 05/25/2007   Essential hypertension 05/25/2007   Allergic rhinitis 05/25/2007   Obstructive sleep apnea 05/17/2007   Home Medication(s) Prior to Admission medications   Medication Sig Start Date End Date Taking? Authorizing Provider  atorvastatin  (LIPITOR) 40 MG tablet Take 1 tablet (40 mg total) by mouth daily. 09/14/23   Tabori, Katherine E, MD  Biotin 89999 MCG TABS Take 1 tablet by mouth daily.    [provider]  Blood Glucose Monitoring Suppl (FREESTYLE LITE) w/Device KIT Use to check blood sugar 2 times daily 07/01/21   Trixie File, MD  carbamazepine  (TEGRETOL  XR) 200 MG 12 hr tablet Take 1 tablet (200 mg total) by mouth in the morning AND 2 tablets (400 mg total) at bedtime. 12/25/22     carbamazepine  (TEGRETOL -XR) 200  MG 12 hr tablet Take 1 tablet (200 mg total) by mouth in the morning and then take 2 tablets (400 mg total) by mouth at bedtime. 09/23/22     carbamazepine  (TEGRETOL -XR) 200 MG 12 hr tablet Take 1 tablet (200 mg total) by mouth every morning AND 2 tablets (400 mg total) at bedtime. 01/28/23     carbamazepine  (TEGRETOL -XR) 200 MG 12 hr tablet Take 1 tablet (200 mg total) by mouth in the morning AND 2 tablets (400 mg total) at bedtime. 04/29/23     Continuous Glucose Sensor (DEXCOM G7 SENSOR) MISC Apply 1 sensor every 10 days as directed. 12/08/23   Trixie File, MD  cyclobenzaprine  (FLEXERIL ) 10 MG tablet Take 1 tablet (10 mg total) by  mouth at bedtime. 07/02/23   Tabori, Katherine E, MD  cyclobenzaprine  (FLEXERIL ) 10 MG tablet Take 1 tablet (10 mg total) by mouth at bedtime as needed. 07/21/23     Dulaglutide  (TRULICITY ) 3 MG/0.5ML SOAJ Inject 3 mg into the skin once a week. 12/08/23   Trixie File, MD  gabapentin  (NEURONTIN ) 800 MG tablet Take 1 tablet (800 mg total) by mouth 3 (three) times daily. 10/08/23   Skeet Juliene SAUNDERS, DO  Glucosamine HCl (GLUCOSAMINE PO) Take 3 tablets by mouth daily.    [provider]  glucose blood (FREESTYLE LITE) test strip Use to check blood sugar 2 times daily 09/25/22   Trixie File, MD  lamoTRIgine  (LAMICTAL ) 200 MG tablet Take 1.5 tablets (300 mg total) by mouth at bedtime. 10/21/23     Lancets (FREESTYLE) lancets Use as instructed to check sugar 2 times daily 12/15/19   Trixie File, MD  lisinopril  (ZESTRIL ) 40 MG tablet Take 1 tablet (40 mg total) by mouth daily. 11/26/23   O'NealDarryle Ned, MD  Loratadine (CLARITIN PO) Take by mouth daily.    [provider]  metFORMIN  (GLUCOPHAGE ) 1000 MG tablet Take 1 tablet (1,000 mg total) by mouth daily with supper. 12/08/23 12/07/24  Trixie File, MD  Multiple Vitamin (MULTIVITAMIN) tablet Take 1 tablet by mouth daily.    [provider]  spironolactone  (ALDACTONE ) 25 MG tablet Take 1 tablet (25 mg total) by mouth daily. 08/17/23   Barbaraann Darryle Ned, MD  TEGRETOL -XR 200 MG 12 hr tablet Take 1 tablet by mouth every morning and take 2 tablets at bedtime 09/19/21     TEGRETOL -XR 200 MG 12 hr tablet Take 1 tablet (200 mg total) by mouth in the morning AND 2 tablets (400 mg total) at bedtime. 12/31/22     TEGRETOL -XR 200 MG 12 hr tablet Take 1 tablet (200 mg total) by mouth in the morning AND 2 tablets (400 mg total) at bedtime. 08/19/23     TEGRETOL -XR 200 MG 12 hr tablet Take 1 tablet (200 mg total) by mouth in the morning AND 2 tablets (400 mg total) at bedtime. 10/21/23     terconazole  (TERAZOL 7 ) 0.4 % vaginal cream  Place 1 applicator vaginally at bedtime. 04/27/23   Trixie File, MD  tiaGABine  (GABITRIL ) 4 MG tablet Take 8 mg by mouth 2 (two) times daily.    [provider]  tiaGABine  (GABITRIL ) 4 MG tablet Take 1 tablet (4 mg total) by mouth in the morning AND 2 tablets (8 mg total) at bedtime. Patient taking differently: Take 1 tablet (4 mg total) by mouth in the morning AND 1 tablets (4 mg total) at bedtime. 06/11/22     tiaGABine  (GABITRIL ) 4 MG tablet Take 1 tablet (4 mg total)  by mouth in the morning and at bedtime. 08/19/23     tiaGABine  (GABITRIL ) 4 MG tablet Take 1 tablet (4 mg total) by mouth in the morning and at bedtime. 10/21/23     Turmeric (QC TUMERIC COMPLEX PO) Take by mouth daily.    [provider]                                                                                                                                    Past Surgical History Past Surgical History:  Procedure Laterality Date   ANKLE SURGERY Left 1989   BACK SURGERY  1999   CATARACT EXTRACTION, BILATERAL     CESAREAN SECTION     1995   LAPAROSCOPIC ASSISTED VAGINAL HYSTERECTOMY  2009     BSO fibroids, DUB, pelvic pain   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 REPAIR N/A 04/14/2016   Procedure: LAPAROSCOPIC ROUX-EN-Y GASTRIC BYPASS  WITH UPPER ENDOSCOPY;  Surgeon: Morene Olives, MD;  Location: [facility] ORS;  Service: General;  Laterality: N/A;   SHOULDER ARTHROSCOPY WITH OPEN ROTATOR CUFF REPAIR Right 01/02/2017   Procedure: Right shoulder arthroscopy, A-subcromial decompression, mini open rotator cuff repair, open distal clavicle resection, biceps tenodesis;  Surgeon: Kay Kemps, MD;  Location: [facility] OR;  Service: Orthopedics;  Laterality: Right;   THYROIDECTOMY, PARTIAL Left 2001   UMBILICAL HERNIA REPAIR  2003   WISDOM TOOTH EXTRACTION     Family History Family History  Problem Relation Age of Onset   Hyperlipidemia Mother    Diabetes Mother    Anxiety disorder Mother     Heart disease Mother    Hypertension Father    Lupus Father    Heart disease Father    Asthma Father    Colon polyps Father 60   Stroke Maternal Aunt    Seizures Paternal Grandfather    Stroke Paternal Grandfather    Mental illness Paternal Grandfather    Breast cancer Neg Hx    Colon cancer Neg Hx    Esophageal cancer Neg Hx    Stomach cancer Neg Hx    Rectal cancer Neg Hx     Social History Social History   Tobacco Use   Smoking status: Never   Smokeless tobacco: Never  Vaping Use   Vaping status: Never Used  Substance Use Topics   Alcohol use: No    Alcohol/week: 0.0 standard drinks of alcohol   Drug use: No   Allergies Zofran  [ondansetron ]  Review of Systems A thorough review of systems was obtained and all systems are negative except as noted in the HPI and PMH.   Physical Exam Vital Signs  I have reviewed the triage vital signs BP (!) 155/65   Pulse 66   Temp 97.6 F (36.4 C)   Resp 14   Ht 5' 9 (1.753 m)   Wt 132 kg   LMP 01/25/2008   SpO2 99%   BMI  42.97 kg/m  Physical Exam Vitals and nursing note reviewed. Exam conducted with a chaperone present.  Constitutional:      General: [name] is not in acute distress.    Appearance: Normal appearance. [name] is obese.  HENT:     Head: Normocephalic and atraumatic.     Right Ear: External ear normal.     Left Ear: External ear normal.     Nose: Nose normal.     Mouth/Throat:     Mouth: Mucous membranes are moist.  Eyes:     General: No scleral icterus.       Right eye: No discharge.        Left eye: No discharge.  Cardiovascular:     Rate and Rhythm: Normal rate and regular rhythm.     Pulses: Normal pulses.     Heart sounds: Normal heart sounds.  Pulmonary:     Effort: Pulmonary effort is normal. No respiratory distress.     Breath sounds: Normal breath sounds. No stridor.  Abdominal:     General: Abdomen is flat. There is no distension.     Palpations: Abdomen is soft.     Tenderness: There is  no abdominal tenderness.  Genitourinary:    Comments: RN Psychologist, prison and probation services  No frank bleeding, brown stool in rectal vault, Musculoskeletal:     Cervical back: No rigidity.     Right lower leg: No edema.     Left lower leg: No edema.  Skin:    General: Skin is warm and dry.     Capillary Refill: Capillary refill takes less than 2 seconds.  Neurological:     General: No focal deficit present.     Mental Status: [name] is alert and oriented to person, place, and time.     GCS: GCS eye subscore is 4. GCS verbal subscore is 5. GCS motor subscore is 6.     Cranial Nerves: No dysarthria or facial asymmetry.     Sensory: No sensory deficit.     Motor: No tremor or pronator drift.     Coordination: Coordination normal.     Gait: Gait is intact.  Psychiatric:        Mood and Affect: Mood normal.        Behavior: Behavior normal. Behavior is cooperative.     ED Results and Treatments Labs (all labs ordered are listed, but only abnormal results are displayed) Labs Reviewed  COMPREHENSIVE METABOLIC PANEL WITH GFR - Abnormal; Notable for the following components:      Result Value   Sodium 123 (*)    Chloride 88 (*)    CO2 21 (*)    Glucose, Bld 163 (*)    Calcium  8.6 (*)    All other components within normal limits  URINALYSIS, ROUTINE W REFLEX MICROSCOPIC - Abnormal; Notable for the following components:   Leukocytes,Ua MODERATE (*)    All other components within normal limits  CBC WITH DIFFERENTIAL/PLATELET - Abnormal; Notable for the following components:   RBC 3.15 (*)    Hemoglobin 9.6 (*)    HCT 27.9 (*)    Eosinophils Absolute 0.7 (*)    All other components within normal limits  RETICULOCYTES - Abnormal; Notable for the following components:   RBC. 3.18 (*)    Immature Retic Fract 19.3 (*)    All other components within normal limits  URINALYSIS, MICROSCOPIC (REFLEX) - Abnormal; Notable for the following components:   Bacteria, UA RARE (*)    All other components  within  normal limits  OCCULT BLOOD X 1 CARD TO LAB, STOOL - Abnormal; Notable for the following components:   Fecal Occult Bld POSITIVE (*)    All other components within normal limits  VITAMIN B12  FOLATE  IRON AND TIBC  FERRITIN  OSMOLALITY  OSMOLALITY, URINE  NA AND K (SODIUM & POTASSIUM), RAND UR  TSH  T4, FREE  TROPONIN T, HIGH SENSITIVITY                                                                                                                          Radiology DG Chest Portable 1 View Result Date: 12/11/2023 EXAM: 1 VIEW XRAY OF THE CHEST 12/11/2023 09:12:31 PM COMPARISON: 02/29/2016 CLINICAL HISTORY: Near syncope. Pt referred to the ED by her PCP. [name] has been having progressive weakness and fatigue for two weeks and her PCP obtained labs. Pt was notified of a low sodium and low iron. Denies ShOB, CP. FINDINGS: LUNGS AND PLEURA: No focal pulmonary opacity. No pulmonary edema. No pleural effusion. No pneumothorax. HEART AND MEDIASTINUM: No acute abnormality of the cardiac and mediastinal silhouettes. BONES AND SOFT TISSUES: No acute osseous abnormality. IMPRESSION: 1. No acute process. Electronically signed by: Pinkie Pebbles MD 12/11/2023 09:17 PM EDT RP Workstation: [id number]    Pertinent labs & imaging results that were available during my care of the patient were reviewed by me and considered in my medical decision making (see MDM for details).  Medications Ordered in ED Medications  sodium chloride  0.9 % bolus 500 mL (has no administration in time range)  pantoprazole  (PROTONIX ) injection 40 mg (40 mg Intravenous Given 12/11/23 2135)                                                                                                                                     Procedures .Critical Care  Performed by: Elnor Jayson LABOR, DO Authorized by: Elnor Jayson LABOR, DO   Critical care provider statement:    Critical care time (minutes):  45   Critical care time was exclusive of:   Separately billable procedures and treating other patients   Critical care was necessary to treat or prevent imminent or life-threatening deterioration of the following conditions:  Circulatory failure and metabolic crisis   Critical care was time spent personally by me on the following activities:  Development of treatment plan with patient or surrogate, discussions  with consultants, evaluation of patient's response to treatment, examination of patient, ordering and review of laboratory studies, ordering and review of radiographic studies, ordering and performing treatments and interventions, pulse oximetry, re-evaluation of patient's condition, review of old charts and obtaining history from patient or surrogate   Care discussed with: admitting provider     (including critical care time)  Medical Decision Making / ED Course    Medical Decision Making:    Syndi Kristyna Bradstreet is a 53 y.o. female with past medical history as below, significant for bipolar disorder, HLD, HTN, hypothyroid bariatric surgery, DM2, OSA who presents to the ED with complaint of fatigue, lightheadedness, low sodium. The complaint involves an extensive differential diagnosis and also carries with it a high risk of complications and morbidity.  Serious etiology was considered. Ddx includes but is not limited to: Electrolyte derangement, polydipsia, renal failure, medication effect, GI bleed, iron deficiency, neoplasm, etc.  Complete initial physical exam performed, notably the patient was in nad, sitting upright.    Reviewed and confirmed nursing documentation for past medical history, family history, social history.  Vital signs reviewed.    Hyponatremia> - has been intermittently hyponatremic in the past, did improve previously w/ fluid restriction per pcp documentation - Sodium today is 123, prior 130-135 - spironolactone  in med rec - send urine lytes, osm, gentle rehydration, plan admit  Symptomatic anemia GI  hemorrhage > - Colonoscopy around 3 years ago with [facility] which was normal - Hgb 13 > 9 today - [name] had dark-colored stool couple days ago but since improved, brown bowel movement earlier today. - Rectal exam with brown appearing stool in the rectal vault but is heme positive.  No frank bleeding.  No abdominal pain - anemia panel - Give Protonix  - notified [facility] Dr Avram of consult    Postural dizziness > - Likely multifactorial in setting of hyponatremia and anemia. - No associated chest pain or palpitations, EKG is nonischemic, troponin is negative, telemetry is stable.  Less likely cardiac     Patient with lightheadedness likely secondary to hyponatremia and symptomatic anemia.  Recommend admission, patient is agreeable. Spoke w/ [facility]               Additional history obtained: -Additional history obtained from na -External records from outside source obtained and reviewed including: Chart review including previous notes, labs, imaging, consultation notes including  Gi documentation Home meds   Lab Tests: -I ordered, reviewed, and interpreted labs.   The pertinent results include:   Labs Reviewed  COMPREHENSIVE METABOLIC PANEL WITH GFR - Abnormal; Notable for the following components:      Result Value   Sodium 123 (*)    Chloride 88 (*)    CO2 21 (*)    Glucose, Bld 163 (*)    Calcium  8.6 (*)    All other components within normal limits  URINALYSIS, ROUTINE W REFLEX MICROSCOPIC - Abnormal; Notable for the following components:   Leukocytes,Ua MODERATE (*)    All other components within normal limits  CBC WITH DIFFERENTIAL/PLATELET - Abnormal; Notable for the following components:   RBC 3.15 (*)    Hemoglobin 9.6 (*)    HCT 27.9 (*)    Eosinophils Absolute 0.7 (*)    All other components within normal limits  RETICULOCYTES - Abnormal; Notable for the following components:   RBC. 3.18 (*)    Immature Retic Fract 19.3 (*)    All other components  within normal limits  URINALYSIS, MICROSCOPIC (  REFLEX) - Abnormal; Notable for the following components:   Bacteria, UA RARE (*)    All other components within normal limits  OCCULT BLOOD X 1 CARD TO LAB, STOOL - Abnormal; Notable for the following components:   Fecal Occult Bld POSITIVE (*)    All other components within normal limits  VITAMIN B12  FOLATE  IRON AND TIBC  FERRITIN  OSMOLALITY  OSMOLALITY, URINE  NA AND K (SODIUM & POTASSIUM), RAND UR  TSH  T4, FREE  TROPONIN T, HIGH SENSITIVITY    Notable for as above  EKG   EKG Interpretation Date/Time:    Ventricular Rate:    PR Interval:    QRS Duration:    QT Interval:    QTC Calculation:   R Axis:      Text Interpretation:           Imaging Studies ordered: I ordered imaging studies including cxr I independently visualized the following imaging with scope of interpretation limited to determining acute life threatening conditions related to emergency care; findings noted above I agree with the radiologist interpretation If any imaging was obtained with contrast I closely monitored patient for any possible adverse reaction a/w contrast administration in the emergency department   Medicines ordered and prescription drug management: Meds ordered this encounter  Medications   pantoprazole  (PROTONIX ) injection 40 mg   sodium chloride  0.9 % bolus 500 mL    -I have reviewed the patients home medicines and have made adjustments as needed   Consultations Obtained: I requested consultation with the hospitalist, notified [facility],  and discussed lab and imaging findings as well as pertinent plan    Cardiac Monitoring: The patient was maintained on a cardiac monitor.  I personally viewed and interpreted the cardiac monitored which showed an underlying rhythm of: nsr Continuous pulse oximetry interpreted by myself, 99% on ra.    Social Determinants of Health:  Diagnosis or treatment significantly limited by social  determinants of health: obesity   Reevaluation: After the interventions noted above, I reevaluated the patient and found that they have stayed the same  Co morbidities that complicate the patient evaluation  Past Medical History:  Diagnosis Date   Allergy    seasonal allergies   Anemia    hx of   Anxiety    on meds   Asthma    hx of   Bipolar disorder (HCC)    Cataract    bilateral sx   Chicken pox    Complication of anesthesia    oxygen saturation dropped after hysterectomy 2009 at [facility]   Depression    on meds   Diabetes mellitus    on meds   Dyspnea    GERD (gastroesophageal reflux disease)    hx of   Hyperlipidemia    on meds   Hypertension    readings   Hypothyroidism    hx of-LEFT thyroid  removed   Irregular heartbeat    saw dr Micky sees -cardiology as needed   Migraine    Sleep apnea    uses cpap      Dispostion: Disposition decision including need for hospitalization was considered, and patient admitted to the hospital.    Final Clinical Impression(s) / ED Diagnoses Final diagnoses:  Hyponatremia  Gastrointestinal hemorrhage, unspecified gastrointestinal hemorrhage type  Symptomatic anemia  Postural dizziness        Elnor Jayson LABOR, DO 12/11/23 2222    Elnor Jayson LABOR, DO 12/11/23 2223

## 2023-12-11 NOTE — ED Notes (Signed)
Pt hooked up to the monitor with a 5 lead, BP cuff and pulse ox ?

## 2023-12-12 DIAGNOSIS — K921 Melena: Secondary | ICD-10-CM | POA: Diagnosis not present

## 2023-12-12 DIAGNOSIS — E782 Mixed hyperlipidemia: Secondary | ICD-10-CM | POA: Diagnosis present

## 2023-12-12 DIAGNOSIS — D62 Acute posthemorrhagic anemia: Secondary | ICD-10-CM | POA: Diagnosis not present

## 2023-12-12 DIAGNOSIS — Z7985 Long-term (current) use of injectable non-insulin antidiabetic drugs: Secondary | ICD-10-CM | POA: Diagnosis not present

## 2023-12-12 DIAGNOSIS — Z8249 Family history of ischemic heart disease and other diseases of the circulatory system: Secondary | ICD-10-CM | POA: Diagnosis not present

## 2023-12-12 DIAGNOSIS — F0394 Unspecified dementia, unspecified severity, with anxiety: Secondary | ICD-10-CM | POA: Diagnosis present

## 2023-12-12 DIAGNOSIS — G4733 Obstructive sleep apnea (adult) (pediatric): Secondary | ICD-10-CM | POA: Diagnosis not present

## 2023-12-12 DIAGNOSIS — K2951 Unspecified chronic gastritis with bleeding: Secondary | ICD-10-CM | POA: Diagnosis not present

## 2023-12-12 DIAGNOSIS — K2961 Other gastritis with bleeding: Secondary | ICD-10-CM | POA: Diagnosis not present

## 2023-12-12 DIAGNOSIS — G8929 Other chronic pain: Secondary | ICD-10-CM | POA: Diagnosis present

## 2023-12-12 DIAGNOSIS — E222 Syndrome of inappropriate secretion of antidiuretic hormone: Secondary | ICD-10-CM | POA: Diagnosis not present

## 2023-12-12 DIAGNOSIS — Z9884 Bariatric surgery status: Secondary | ICD-10-CM | POA: Diagnosis not present

## 2023-12-12 DIAGNOSIS — Z6841 Body Mass Index (BMI) 40.0 and over, adult: Secondary | ICD-10-CM | POA: Diagnosis not present

## 2023-12-12 DIAGNOSIS — Z7984 Long term (current) use of oral hypoglycemic drugs: Secondary | ICD-10-CM | POA: Diagnosis not present

## 2023-12-12 DIAGNOSIS — E039 Hypothyroidism, unspecified: Secondary | ICD-10-CM | POA: Diagnosis not present

## 2023-12-12 DIAGNOSIS — Z791 Long term (current) use of non-steroidal anti-inflammatories (NSAID): Secondary | ICD-10-CM | POA: Diagnosis not present

## 2023-12-12 DIAGNOSIS — D649 Anemia, unspecified: Secondary | ICD-10-CM

## 2023-12-12 DIAGNOSIS — E119 Type 2 diabetes mellitus without complications: Secondary | ICD-10-CM | POA: Diagnosis not present

## 2023-12-12 DIAGNOSIS — K219 Gastro-esophageal reflux disease without esophagitis: Secondary | ICD-10-CM | POA: Diagnosis present

## 2023-12-12 DIAGNOSIS — I10 Essential (primary) hypertension: Secondary | ICD-10-CM | POA: Diagnosis not present

## 2023-12-12 DIAGNOSIS — J45909 Unspecified asthma, uncomplicated: Secondary | ICD-10-CM | POA: Diagnosis present

## 2023-12-12 DIAGNOSIS — F0393 Unspecified dementia, unspecified severity, with mood disturbance: Secondary | ICD-10-CM | POA: Diagnosis not present

## 2023-12-12 DIAGNOSIS — K2901 Acute gastritis with bleeding: Secondary | ICD-10-CM | POA: Diagnosis not present

## 2023-12-12 DIAGNOSIS — K2970 Gastritis, unspecified, without bleeding: Secondary | ICD-10-CM | POA: Diagnosis not present

## 2023-12-12 DIAGNOSIS — D539 Nutritional anemia, unspecified: Secondary | ICD-10-CM

## 2023-12-12 DIAGNOSIS — D509 Iron deficiency anemia, unspecified: Secondary | ICD-10-CM

## 2023-12-12 DIAGNOSIS — Z833 Family history of diabetes mellitus: Secondary | ICD-10-CM | POA: Diagnosis not present

## 2023-12-12 DIAGNOSIS — T39395A Adverse effect of other nonsteroidal anti-inflammatory drugs [NSAID], initial encounter: Secondary | ICD-10-CM | POA: Diagnosis present

## 2023-12-12 DIAGNOSIS — R531 Weakness: Secondary | ICD-10-CM | POA: Diagnosis present

## 2023-12-12 DIAGNOSIS — F319 Bipolar disorder, unspecified: Secondary | ICD-10-CM | POA: Diagnosis not present

## 2023-12-12 DIAGNOSIS — E1140 Type 2 diabetes mellitus with diabetic neuropathy, unspecified: Secondary | ICD-10-CM | POA: Diagnosis not present

## 2023-12-12 DIAGNOSIS — K254 Chronic or unspecified gastric ulcer with hemorrhage: Secondary | ICD-10-CM | POA: Diagnosis not present

## 2023-12-12 LAB — HEMOGLOBIN AND HEMATOCRIT, BLOOD
HCT: 31.5 % — ABNORMAL LOW (ref 36.0–46.0)
Hemoglobin: 10.8 g/dL — ABNORMAL LOW (ref 12.0–15.0)

## 2023-12-12 LAB — IRON AND TIBC
Iron: 60 ug/dL (ref 28–170)
Saturation Ratios: 18 % (ref 10.4–31.8)
TIBC: 332 ug/dL (ref 250–450)
UIBC: 272 ug/dL

## 2023-12-12 LAB — FERRITIN: Ferritin: 23 ng/mL (ref 11–307)

## 2023-12-12 LAB — BASIC METABOLIC PANEL WITH GFR
Anion gap: 11 (ref 5–15)
BUN: 7 mg/dL (ref 6–20)
CO2: 24 mmol/L (ref 22–32)
Calcium: 8.9 mg/dL (ref 8.9–10.3)
Chloride: 97 mmol/L — ABNORMAL LOW (ref 98–111)
Creatinine, Ser: 0.47 mg/dL (ref 0.44–1.00)
GFR, Estimated: >60 mL/min (ref >60)
Glucose, Bld: 117 mg/dL — ABNORMAL HIGH (ref 70–99)
Potassium: 4.3 mmol/L (ref 3.5–5.1)
Sodium: 132 mmol/L — ABNORMAL LOW (ref 135–145)

## 2023-12-12 LAB — GLUCOSE, CAPILLARY
Glucose-Capillary: 133 mg/dL — ABNORMAL HIGH (ref 70–99)
Glucose-Capillary: 120 mg/dL — ABNORMAL HIGH (ref 70–99)
Glucose-Capillary: 201 mg/dL — ABNORMAL HIGH (ref 70–99)

## 2023-12-12 LAB — OSMOLALITY, URINE: Osmolality, Ur: 405 mosm/kg (ref 300–900)

## 2023-12-12 LAB — FOLATE: Folate: 24.2 ng/mL (ref >5.9)

## 2023-12-12 LAB — HIV ANTIBODY (ROUTINE TESTING W REFLEX): HIV Screen 4th Generation wRfx: NONREACTIVE

## 2023-12-12 LAB — NA AND K (SODIUM & POTASSIUM), RAND UR
Potassium Urine: 27 mmol/L
Sodium, Ur: 82 mmol/L

## 2023-12-12 LAB — VITAMIN B12: Vitamin B-12: 1498 pg/mL — ABNORMAL HIGH (ref 180–914)

## 2023-12-12 LAB — T4, FREE: Free T4: 0.9 ng/dL (ref 0.61–1.12)

## 2023-12-12 LAB — OSMOLALITY: Osmolality: 269 mosm/kg — ABNORMAL LOW (ref 275–295)

## 2023-12-12 LAB — TSH: TSH: 1.39 u[IU]/mL (ref 0.350–4.500)

## 2023-12-12 MED ORDER — GABAPENTIN 400 MG PO CAPS
800.0000 mg | ORAL_CAPSULE | Freq: Three times a day (TID) | ORAL | Status: DC
  Administered 2023-12-12 – 2023-12-14 (×7): 800 mg via ORAL
  Filled 2023-12-12 (×8): qty 2

## 2023-12-12 MED ORDER — CARBAMAZEPINE ER 200 MG PO TB12: 200.0000 mg | ORAL_TABLET | Freq: Every morning | ORAL | Status: DC

## 2023-12-12 MED ORDER — PANTOPRAZOLE SODIUM 40 MG IV SOLR
40.0000 mg | Freq: Two times a day (BID) | INTRAVENOUS | Status: DC
  Administered 2023-12-12 – 2023-12-13 (×3): 40 mg via INTRAVENOUS
  Filled 2023-12-12 (×3): qty 10

## 2023-12-12 MED ORDER — LISINOPRIL 20 MG PO TABS
40.0000 mg | ORAL_TABLET | Freq: Every day | ORAL | Status: DC
  Administered 2023-12-12 – 2023-12-14 (×3): 40 mg via ORAL
  Filled 2023-12-12 (×3): qty 2

## 2023-12-12 MED ORDER — CARBAMAZEPINE ER 400 MG PO TB12: 400.0000 mg | ORAL_TABLET | Freq: Every day | ORAL | Status: DC

## 2023-12-12 MED ORDER — INSULIN ASPART 100 UNIT/ML IJ SOLN
0.0000 [IU] | Freq: Three times a day (TID) | INTRAMUSCULAR | Status: DC
  Administered 2023-12-12: 2 [IU] via SUBCUTANEOUS
  Administered 2023-12-13 (×2): 3 [IU] via SUBCUTANEOUS
  Administered 2023-12-14: 2 [IU] via SUBCUTANEOUS

## 2023-12-12 MED ORDER — LAMOTRIGINE 100 MG PO TABS
300.0000 mg | ORAL_TABLET | Freq: Every day | ORAL | Status: DC
  Administered 2023-12-12 – 2023-12-13 (×3): 300 mg via ORAL
  Filled 2023-12-12 (×3): qty 3

## 2023-12-12 MED ORDER — ATORVASTATIN CALCIUM 40 MG PO TABS
40.0000 mg | ORAL_TABLET | Freq: Every day | ORAL | Status: DC
  Administered 2023-12-12 – 2023-12-14 (×3): 40 mg via ORAL
  Filled 2023-12-12 (×3): qty 1

## 2023-12-12 MED ORDER — CYCLOBENZAPRINE HCL 5 MG PO TABS
5.0000 mg | ORAL_TABLET | Freq: Three times a day (TID) | ORAL | Status: DC | PRN
  Administered 2023-12-12 – 2023-12-13 (×2): 5 mg via ORAL
  Filled 2023-12-12 (×2): qty 1

## 2023-12-12 MED ORDER — TIAGABINE HCL 2 MG PO TABS
4.0000 mg | ORAL_TABLET | Freq: Two times a day (BID) | ORAL | Status: DC
  Administered 2023-12-12 – 2023-12-14 (×5): 4 mg via ORAL
  Filled 2023-12-12: qty 2
  Filled 2023-12-12: qty 1
  Filled 2023-12-12 (×4): qty 2

## 2023-12-12 MED ORDER — CARBAMAZEPINE ER 200 MG PO TB12
200.0000 mg | ORAL_TABLET | Freq: Every day | ORAL | Status: DC
Start: 2023-12-12 — End: 2023-12-14
  Administered 2023-12-12 – 2023-12-14 (×3): 200 mg via ORAL
  Filled 2023-12-12 (×4): qty 1

## 2023-12-12 MED ORDER — SPIRONOLACTONE 25 MG PO TABS
25.0000 mg | ORAL_TABLET | Freq: Every day | ORAL | Status: DC
  Administered 2023-12-12 – 2023-12-14 (×3): 25 mg via ORAL
  Filled 2023-12-12 (×3): qty 1

## 2023-12-12 MED ORDER — METFORMIN HCL 500 MG PO TABS: 1000.0000 mg | ORAL_TABLET | Freq: Every day | ORAL | Status: DC

## 2023-12-12 MED ORDER — CARBAMAZEPINE ER 200 MG PO TB12
400.0000 mg | ORAL_TABLET | Freq: Every day | ORAL | Status: DC
  Administered 2023-12-12 – 2023-12-13 (×2): 400 mg via ORAL
  Filled 2023-12-12 (×2): qty 2
  Filled 2023-12-12: qty 1

## 2023-12-12 MED ORDER — INSULIN ASPART 100 UNIT/ML IJ SOLN
0.0000 [IU] | Freq: Every day | INTRAMUSCULAR | Status: DC
  Administered 2023-12-12 – 2023-12-13 (×2): 2 [IU] via SUBCUTANEOUS

## 2023-12-12 MED ORDER — ACETAMINOPHEN 325 MG PO TABS
650.0000 mg | ORAL_TABLET | Freq: Four times a day (QID) | ORAL | Status: DC | PRN
  Administered 2023-12-12: 650 mg via ORAL
  Filled 2023-12-12: qty 2

## 2023-12-12 MED ORDER — ACETAMINOPHEN 650 MG RE SUPP: 650.0000 mg | Freq: Four times a day (QID) | RECTAL | Status: DC | PRN

## 2023-12-12 NOTE — Anesthesia Preprocedure Evaluation (Signed)
 Anesthesia Evaluation  Patient identified by MRN, date of birth, ID band Patient awake    Reviewed: Allergy & Precautions, NPO status , Patient's Chart, lab work & pertinent test results  History of Anesthesia Complications Negative for: history of anesthetic complications  Airway Mallampati: III  TM Distance: >3 FB Neck ROM: Full    Dental  (+) Dental Advisory Given   Pulmonary shortness of breath, sleep apnea and Continuous Positive Airway Pressure Ventilation , neg COPD, neg recent URI, Not current smoker, neg PE   Pulmonary exam normal breath sounds clear to auscultation       Cardiovascular hypertension, Pt. on medications (-) angina (-) Past MI and (-) CHF (-) dysrhythmias  Rhythm:Regular Rate:Normal     Neuro/Psych  Headaches PSYCHIATRIC DISORDERS Anxiety Depression Bipolar Disorder    Neuromuscular disease    GI/Hepatic Neg liver ROS,GERD  Medicated and Controlled,,  Endo/Other  diabetes, Type 2, Oral Hypoglycemic AgentsHypothyroidism  Class 3 obesity  Renal/GU negative Renal ROS     Musculoskeletal  (+) Arthritis ,    Abdominal  (+) + obese  Peds  Hematology  (+) Blood dyscrasia, anemia   Anesthesia Other Findings   Reproductive/Obstetrics                              Anesthesia Physical Anesthesia Plan  ASA: 3  Anesthesia Plan: MAC   Post-op Pain Management: Minimal or no pain anticipated   Induction: Intravenous  PONV Risk Score and Plan:   Airway Management Planned:   Additional Equipment: None  Intra-op Plan:   Post-operative Plan:   Informed Consent: I have reviewed the patients History and Physical, chart, labs and discussed the procedure including the risks, benefits and alternatives for the proposed anesthesia with the patient or authorized representative who has indicated his/her understanding and acceptance.     Dental advisory given  Plan Discussed  with: CRNA  Anesthesia Plan Comments:          Anesthesia Quick Evaluation

## 2023-12-12 NOTE — ED Notes (Signed)
 Attempted to call report to 6 n will call me back

## 2023-12-12 NOTE — H&P (Signed)
 History and Physical    [name] FMW:[id number] DOB: 1971/02/22 DOA: 12/11/2023  PCP: Seabron Lenis, MD  Patient coming from: Home  I have personally briefly reviewed patient's old medical records in [facility]  Chief Complaint: Progressive weakness  HPI: [name] is a 53 y.o. female with medical history significant of type 2 diabetes, depression, bipolar, hypertension, hyperlipidemia, GERD, obesity, status post bariatric surgery, hypothyroidism presented to emergency department with worsening progressive weakness and fatigue for the past 2 weeks.  Patient was referred to the ED by her PCP due to low hemoglobin and sodium.  Patient reports worsening fatigue, generalized weakness, lightheadedness for the past 2 weeks.  Seen by PCP and her labs showed low sodium as well as hemoglobin.  She was recommended ED evaluation.  Reports some nausea but denies abdominal pain, vomiting, poor appetite, unintentional weight loss.  Reports that she takes Aleve  every day for several years due to her chronic back pain.  She had charcoal colored stool on Tuesdays and Wednesday.  Had colonoscopy with [facility] about 3 to 4 years ago that reported to be negative.  She also takes aspirin 81 mg daily.  She is not on any blood thinners.  No history of tobacco abuse, alcohol abuse, illicit drug use.  ED Course: Upon arrival to ED: Patient afebrile, pulse 93, RR: 18, BP 143/72.  CBC shows no leukocytosis, H&H 9.6/27.9, PLT 296.  NA 123, CO2 21, creatinine 0.54, normal iron panel, ferritin, folate and elevated B12 level.  Occult blood positive.  Normal TSH.  EDP consulted GI.  Patient admitted for further workup on GI bleed.  Review of Systems: As per HPI otherwise negative.    Past Medical History:  Diagnosis Date   Allergy    seasonal allergies   Anemia    hx of   Anxiety    on meds   Asthma    hx of   Bipolar disorder (HCC)    Cataract    bilateral sx   Chicken pox     Complication of anesthesia    oxygen saturation dropped after hysterectomy 2009 at [facility]   Depression    on meds   Diabetes mellitus    on meds   Dyspnea    GERD (gastroesophageal reflux disease)    hx of   Hyperlipidemia    on meds   Hypertension    readings   Hypothyroidism    hx of-LEFT thyroid  removed   Irregular heartbeat    saw dr Micky sees -cardiology as needed   Migraine    Sleep apnea    uses cpap    Past Surgical History:  Procedure Laterality Date   ANKLE SURGERY Left 1989   BACK SURGERY  1999   CATARACT EXTRACTION, BILATERAL     CESAREAN SECTION     1995   LAPAROSCOPIC ASSISTED VAGINAL HYSTERECTOMY  2009     BSO fibroids, DUB, pelvic pain   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 REPAIR N/A 04/14/2016   Procedure: LAPAROSCOPIC ROUX-EN-Y GASTRIC BYPASS  WITH UPPER ENDOSCOPY;  Surgeon: Morene Olives, MD;  Location: [facility] ORS;  Service: General;  Laterality: N/A;   SHOULDER ARTHROSCOPY WITH OPEN ROTATOR CUFF REPAIR Right 01/02/2017   Procedure: Right shoulder arthroscopy, A-subcromial decompression, mini open rotator cuff repair, open distal clavicle resection, biceps tenodesis;  Surgeon: Kay Kemps, MD;  Location: [facility] OR;  Service: Orthopedics;  Laterality: Right;   THYROIDECTOMY, PARTIAL Left 2001   UMBILICAL HERNIA REPAIR  2003   WISDOM TOOTH EXTRACTION       reports that she has never smoked. She has never used smokeless tobacco. She reports that she does not drink alcohol and does not use drugs.  Allergies  Allergen Reactions   Zofran  [Ondansetron ] Other (See Comments)    Severe headaches     Family History  Problem Relation Age of Onset   Hyperlipidemia Mother    Diabetes Mother    Anxiety disorder Mother    Heart disease Mother    Hypertension Father    Lupus Father    Heart disease Father    Asthma Father    Colon polyps Father 3   Stroke Maternal Aunt    Seizures Paternal Grandfather    Stroke Paternal Grandfather     Mental illness Paternal Grandfather    Breast cancer Neg Hx    Colon cancer Neg Hx    Esophageal cancer Neg Hx    Stomach cancer Neg Hx    Rectal cancer Neg Hx     Prior to Admission medications   Medication Sig Start Date End Date Taking? Authorizing Provider  acetaminophen  (TYLENOL ) 500 MG tablet Take 500-1,000 mg by mouth every 6 (six) hours as needed for moderate pain (pain score 4-6).   Yes [provider]  atorvastatin  (LIPITOR) 40 MG tablet Take 1 tablet (40 mg total) by mouth daily. 09/14/23  Yes Tabori, Katherine E, MD  Biotin 89999 MCG TABS Take 1 tablet by mouth daily.   Yes [provider]  cyclobenzaprine  (FLEXERIL ) 10 MG tablet Take 1 tablet (10 mg total) by mouth at bedtime as needed. Patient taking differently: Take 10 mg by mouth at bedtime. 07/21/23  Yes   Dulaglutide  (TRULICITY ) 3 MG/0.5ML SOAJ Inject 3 mg into the skin once a week. 12/08/23  Yes Trixie File, MD  gabapentin  (NEURONTIN ) 800 MG tablet Take 1 tablet (800 mg total) by mouth 3 (three) times daily. 10/08/23  Yes Jaffe, Adam R, DO  Glucosamine HCl (GLUCOSAMINE PO) Take 3 tablets by mouth daily.   Yes [provider]  lamoTRIgine  (LAMICTAL ) 200 MG tablet Take 1.5 tablets (300 mg total) by mouth at bedtime. 10/21/23  Yes   lisinopril  (ZESTRIL ) 40 MG tablet Take 1 tablet (40 mg total) by mouth daily. Patient taking differently: Take 40 mg by mouth at bedtime. 11/26/23  Yes O'Neal, Darryle Ned, MD  Loratadine (CLARITIN PO) Take 1 tablet by mouth at bedtime.   Yes [provider]  metFORMIN  (GLUCOPHAGE ) 1000 MG tablet Take 1 tablet (1,000 mg total) by mouth daily with supper. 12/08/23 12/07/24 Yes Trixie File, MD  Multiple Vitamin (MULTIVITAMIN) tablet Take 1 tablet by mouth daily.   Yes [provider]  naproxen  sodium (ALEVE ) 220 MG tablet Take 220 mg by mouth daily.   Yes [provider]  spironolactone  (ALDACTONE ) 25 MG tablet Take 1 tablet (25 mg total)  by mouth daily. 08/17/23  Yes O'Neal, Darryle Ned, MD  TEGRETOL -XR 200 MG 12 hr tablet Take 1 tablet (200 mg total) by mouth in the morning AND 2 tablets (400 mg total) at bedtime. Patient taking differently: Take 1 tablet (200 mg total) by mouth at lunchtime AND 2 tablets (400 mg total) at bedtime. 10/21/23  Yes   tiaGABine  (GABITRIL ) 4 MG tablet Take 1 tablet (4 mg total) by mouth in the morning and at bedtime. 08/19/23  Yes   Turmeric (QC TUMERIC COMPLEX PO) Take 1 Dose by mouth daily.   Yes [provider]  carbamazepine  (TEGRETOL -XR) 200 MG 12 hr tablet Take 1 tablet (200 mg total) by mouth in the morning AND 2 tablets (400 mg total) at bedtime. Patient not taking: Reported on 12/12/2023 04/29/23     Continuous Glucose Sensor (DEXCOM G7 SENSOR) MISC Apply 1 sensor every 10 days as directed. 12/08/23   Trixie File, MD  glucose blood (FREESTYLE LITE) test strip Use to check blood sugar 2 times daily 09/25/22   Trixie File, MD    Physical Exam: Vitals:   12/12/23 0420 12/12/23 0815 12/12/23 0831 12/12/23 0930  BP: (!) 153/65 (!) 159/71  (!) 158/83  Pulse: 73 78  67  Resp: 20 14  17   Temp:   98.8 F (37.1 C) 98.5 F (36.9 C)  TempSrc:    Oral  SpO2: 97% 99%  97%  Weight:      Height:        Constitutional: NAD, calm, comfortable, on room air, communicating well, obese Eyes: PERRL, lids and conjunctivae normal ENMT: Mucous membranes are moist. Posterior pharynx clear of any exudate or lesions.Normal dentition.  Neck: normal, supple, no masses, no thyromegaly Respiratory: clear to auscultation bilaterally, no wheezing, no crackles. Normal respiratory effort. No accessory muscle use.  Cardiovascular: Regular rate and rhythm, no murmurs / rubs / gallops. No extremity edema. 2+ pedal pulses. No carotid bruits.  Abdomen: no tenderness, no masses palpated. No hepatosplenomegaly. Bowel sounds positive.  Musculoskeletal: no clubbing / cyanosis. No joint deformity upper and  lower extremities. Good ROM, no contractures. Normal muscle tone.  Skin: no rashes, lesions, ulcers. No induration Neurologic: CN 2-12 grossly intact. Sensation intact, DTR normal. Strength 5/5 in all 4.  Psychiatric: Normal judgment and insight. Alert and oriented x 3. Normal mood.    Labs on Admission: I have personally reviewed following labs and imaging studies  CBC: Recent Labs  Lab 12/11/23 1818  WBC 8.7  NEUTROABS 5.0  HGB 9.6*  HCT 27.9*  MCV 88.6  PLT 296   Basic Metabolic Panel: Recent Labs  Lab 12/11/23 1818  NA 123*  K 4.4  CL 88*  CO2 21*  GLUCOSE 163*  BUN 13  CREATININE 0.54  CALCIUM  8.6*   GFR: Estimated Creatinine Clearance: 120.1 mL/min (by C-G formula based on SCr of 0.54 mg/dL). Liver Function Tests: Recent Labs  Lab 12/11/23 1818  AST 31  ALT 26  ALKPHOS 93  BILITOT <0.2  PROT 6.5  ALBUMIN 4.1   No results for input(s): LIPASE, AMYLASE in the last 168 hours. No results for input(s): AMMONIA in the last 168 hours. Coagulation Profile: No results for input(s): INR, PROTIME in the last 168 hours. Cardiac Enzymes: No results for input(s): CKTOTAL, CKMB, CKMBINDEX, TROPONINI in the last 168 hours. BNP (last 3 results) No results for input(s): PROBNP in the last 8760 hours. HbA1C: No results for input(s): HGBA1C in the last 72 hours. CBG: No results for input(s): GLUCAP in the last 168 hours. Lipid Profile: No results for input(s): CHOL, HDL, LDLCALC, TRIG, CHOLHDL, LDLDIRECT in the last 72 hours. Thyroid  Function Tests: Recent Labs    12/11/23 2230  TSH 1.390  FREET4 0.90   Anemia Panel: Recent Labs    12/11/23 1818 12/11/23 1958 12/11/23 2000  VITAMINB12  --  1,498*  --   FOLATE  --   --  24.2  FERRITIN  --  23  --   TIBC  --  332  --   IRON  --  60  --  RETICCTPCT 2.3  --   --    Urine analysis:    Component Value Date/Time   COLORURINE YELLOW 12/11/2023 1957   APPEARANCEUR CLEAR  12/11/2023 1957   LABSPEC 1.015 12/11/2023 1957   PHURINE 7.0 12/11/2023 1957   GLUCOSEU NEGATIVE 12/11/2023 1957   HGBUR NEGATIVE 12/11/2023 1957   BILIRUBINUR NEGATIVE 12/11/2023 1957   BILIRUBINUR n 02/19/2018 1032   KETONESUR NEGATIVE 12/11/2023 1957   PROTEINUR NEGATIVE 12/11/2023 1957   UROBILINOGEN 0.2 02/19/2018 1032   NITRITE NEGATIVE 12/11/2023 1957   LEUKOCYTESUR MODERATE (A) 12/11/2023 1957    Radiological Exams on Admission: DG Chest Portable 1 View Result Date: 12/11/2023 EXAM: 1 VIEW XRAY OF THE CHEST 12/11/2023 09:12:31 PM COMPARISON: 02/29/2016 CLINICAL HISTORY: Near syncope. Pt referred to the ED by her PCP. She has been having progressive weakness and fatigue for two weeks and her PCP obtained labs. Pt was notified of a low sodium and low iron. Denies ShOB, CP. FINDINGS: LUNGS AND PLEURA: No focal pulmonary opacity. No pulmonary edema. No pleural effusion. No pneumothorax. HEART AND MEDIASTINUM: No acute abnormality of the cardiac and mediastinal silhouettes. BONES AND SOFT TISSUES: No acute osseous abnormality. IMPRESSION: 1. No acute process. Electronically signed by: Pinkie Pebbles MD 12/11/2023 09:17 PM EDT RP Workstation: [id number]    EKG: Independently reviewed.  Normal sinus rhythm, no acute ST-T wave changes noted.  Assessment/Plan  Upper GI bleed: -Likely due to NSAID induced gastritis.  Takes Aleve  every day due to chronic back pain - Presented with progressive weakness and fatigue.  Hemoglobin dropped from 13.0-9.6.  Occult blood positive.  Normal iron panel/ferritin -Hold on NSAIDs.  Start IV PPI.  Monitor H&H closely.  Will keep her n.p.o. - GI on board-await recommendation  Hyponatremia: - Per chart review looks like she has chronic hyponatremia.  Low serum osmolality-suggestive of hypotonic hyponatremia - Could be due to medication carbamazepine /SIADH - Will continue to monitor.  Hypertension: - Stable.  Continue lisinopril , Aldactone .  Monitor  blood pressure   type 2 diabetes: Hold on metformin , Trulicity .  Start sliding scale insulin . - Followed by endocrinology Dr. Trixie outpatient - Last A1c 7.2%.  Bipolar/depression/anxiety: - Continue home medication   Hyperlipidemia: Continue statins  Morbid obesity with BMI of 42 - Status post gastric bypass - Continue diet modification exercise  Diabetic neuropathy: Continue gabapentin   DVT prophylaxis: SCD, no chemical prophylaxis due to GI bleed Code Status: Full code Family Communication: None present at bedside.  Plan of care discussed with patient in length and she verbalized understanding and agreed with it. Disposition Plan: Home Consults called: GI Admission status: Inpatient   Velna JONELLE Skeeter MD [facility]  If 7PM-7AM, please contact night-coverage [email or web address]  12/12/2023, 10:45 AM

## 2023-12-12 NOTE — Plan of Care (Signed)
  Problem: Education: Goal: Knowledge of General Education information will improve Description: Including pain rating scale, medication(s)/side effects and non-pharmacologic comfort measures Outcome: Progressing   Problem: Health Behavior/Discharge Planning: Goal: Ability to manage health-related needs will improve Outcome: Progressing   Problem: Clinical Measurements: Goal: Ability to maintain clinical measurements within normal limits will improve Outcome: Progressing Goal: Will remain free from infection Outcome: Progressing Goal: Diagnostic test results will improve Outcome: Progressing Goal: Respiratory complications will improve Outcome: Progressing Goal: Cardiovascular complication will be avoided Outcome: Progressing   Problem: Activity: Goal: Risk for activity intolerance will decrease Outcome: Progressing   Problem: Nutrition: Goal: Adequate nutrition will be maintained Outcome: Progressing   Problem: Coping: Goal: Level of anxiety will decrease Outcome: Progressing   Problem: Elimination: Goal: Will not experience complications related to bowel motility Outcome: Progressing Goal: Will not experience complications related to urinary retention Outcome: Progressing   Problem: Pain Managment: Goal: General experience of comfort will improve and/or be controlled Outcome: Progressing   Problem: Safety: Goal: Ability to remain free from injury will improve Outcome: Progressing   Problem: Skin Integrity: Goal: Risk for impaired skin integrity will decrease Outcome: Progressing   Problem: Education: Goal: Ability to describe self-care measures that may prevent or decrease complications (Diabetes Survival Skills Education) will improve Outcome: Progressing Goal: Individualized Educational Video(s) Outcome: Progressing   Problem: Coping: Goal: Ability to adjust to condition or change in health will improve Outcome: Progressing   Problem: Fluid  Volume: Goal: Ability to maintain a balanced intake and output will improve Outcome: Progressing   Problem: Metabolic: Goal: Ability to maintain appropriate glucose levels will improve Outcome: Progressing   Problem: Health Behavior/Discharge Planning: Goal: Ability to identify and utilize available resources and services will improve Outcome: Progressing Goal: Ability to manage health-related needs will improve Outcome: Progressing   Problem: Nutritional: Goal: Maintenance of adequate nutrition will improve Outcome: Progressing Goal: Progress toward achieving an optimal weight will improve Outcome: Progressing

## 2023-12-12 NOTE — Consult Note (Signed)
 Consultation  Referring Provider:  [facility]/ Pahwani Primary Care Physician:  Seabron Lenis, MD Primary Gastroenterologist:  Dr.Nandigam  Reason for Consultation: Anemia, heme positive stool, symptomatic with weakness, fatigue, intermittent lightheadedness   HPI: [name] is a 53 y.o. female, known to Dr. Shila from prior colonoscopy done in December 2021 at which time she was found to have scattered diverticulosis and nonbleeding internal hemorrhoids, no polyps. We were contacted from the med [facility] ER last p.m. about pending transfer here.  Patient had presented there with complaints of fatigue and weakness and intermittent lightheadedness with standing over the past couple of weeks.  She says maybe she had had some milder symptoms of fatigue over the past couple of months.  No complaints of abdominal pain, no heartburn or indigestion, no dysphagia or odynophagia.  She had not noticed any melena or hematochezia however earlier this week on Tuesday she did have 1 bowel movement that she says was charcoal in color which she has not ever noticed before.  She has not been taking any Pepto-Bismol.  She does take oral iron on a daily basis as she had been told that she was iron deficient in the past. She takes 1 Aleve  daily long-term but more recently says occasionally she will take 2 daily.  She also takes a baby aspirin.  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  Labs in the ER yesterday with WBC 8.7/hemoglobin 9.6/hematocrit 27.9 Sodium 123/potassium 4.4/BUN 13/creatinine 0.54 LFTs within normal limits Ferritin 23 Serum iron 60/TIBC 332/iron sat 18 B12 1498 Stool documented heme positive yesterday  Globin pending this a.m.  Patient has been hemodynamically stable, she has been n.p.o. today.  Medical problems include adult onset diabetes mellitus, bipolar disorder, depression.  Past  Medical History:  Diagnosis Date   Allergy    seasonal allergies   Anemia    hx of   Anxiety    on meds   Asthma    hx of   Bipolar disorder (HCC)    Cataract    bilateral sx   Chicken pox    Complication of anesthesia    oxygen saturation dropped after hysterectomy 2009 at [facility]   Depression    on meds   Diabetes mellitus    on meds   Dyspnea    GERD (gastroesophageal reflux disease)    hx of   Hyperlipidemia    on meds   Hypertension    readings   Hypothyroidism    hx of-LEFT thyroid  removed   Irregular heartbeat    saw dr Micky sees -cardiology as needed   Migraine    Sleep apnea    uses cpap    Past Surgical History:  Procedure Laterality Date   ANKLE SURGERY Left 1989   BACK SURGERY  1999   CATARACT EXTRACTION, BILATERAL     CESAREAN SECTION     1995   LAPAROSCOPIC ASSISTED VAGINAL HYSTERECTOMY  2009     BSO fibroids, DUB, pelvic pain   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 REPAIR N/A 04/14/2016   Procedure: LAPAROSCOPIC ROUX-EN-Y GASTRIC BYPASS  WITH UPPER ENDOSCOPY;  Surgeon: Morene Olives, MD;  Location: [facility] ORS;  Service: General;  Laterality: N/A;   SHOULDER ARTHROSCOPY WITH OPEN ROTATOR CUFF REPAIR Right 01/02/2017   Procedure: Right shoulder arthroscopy, A-subcromial decompression, mini open rotator cuff repair, open distal clavicle resection, biceps tenodesis;  Surgeon: Kay Kemps, MD;  Location: [facility] OR;  Service: Orthopedics;  Laterality: Right;   THYROIDECTOMY, PARTIAL Left 2001   UMBILICAL HERNIA REPAIR  2003   WISDOM TOOTH EXTRACTION      Prior to Admission medications   Medication Sig Start Date End Date Taking? Authorizing Provider  acetaminophen  (TYLENOL ) 500 MG tablet Take 500-1,000 mg by mouth every 6 (six) hours as needed for moderate pain (pain score 4-6).   Yes [provider]  atorvastatin  (LIPITOR) 40 MG tablet Take 1 tablet (40 mg total) by mouth daily. 09/14/23  Yes Tabori, Katherine E, MD  Biotin 89999  MCG TABS Take 1 tablet by mouth daily.   Yes [provider]  cyclobenzaprine  (FLEXERIL ) 10 MG tablet Take 1 tablet (10 mg total) by mouth at bedtime as needed. Patient taking differently: Take 10 mg by mouth at bedtime. 07/21/23  Yes   Dulaglutide  (TRULICITY ) 3 MG/0.5ML SOAJ Inject 3 mg into the skin once a week. 12/08/23  Yes Trixie File, MD  gabapentin  (NEURONTIN ) 800 MG tablet Take 1 tablet (800 mg total) by mouth 3 (three) times daily. 10/08/23  Yes Jaffe, Adam R, DO  Glucosamine HCl (GLUCOSAMINE PO) Take 3 tablets by mouth daily.   Yes [provider]  lamoTRIgine  (LAMICTAL ) 200 MG tablet Take 1.5 tablets (300 mg total) by mouth at bedtime. 10/21/23  Yes   lisinopril  (ZESTRIL ) 40 MG tablet Take 1 tablet (40 mg total) by mouth daily. Patient taking differently: Take 40 mg by mouth at bedtime. 11/26/23  Yes O'Neal, Darryle Ned, MD  Loratadine (CLARITIN PO) Take 1 tablet by mouth at bedtime.   Yes [provider]  metFORMIN  (GLUCOPHAGE ) 1000 MG tablet Take 1 tablet (1,000 mg total) by mouth daily with supper. 12/08/23 12/07/24 Yes Trixie File, MD  Multiple Vitamin (MULTIVITAMIN) tablet Take 1 tablet by mouth daily.   Yes [provider]  naproxen  sodium (ALEVE ) 220 MG tablet Take 220 mg by mouth daily.   Yes [provider]  spironolactone  (ALDACTONE ) 25 MG tablet Take 1 tablet (25 mg total) by mouth daily. 08/17/23  Yes O'Neal, Darryle Ned, MD  TEGRETOL -XR 200 MG 12 hr tablet Take 1 tablet (200 mg total) by mouth in the morning AND 2 tablets (400 mg total) at bedtime. Patient taking differently: Take 1 tablet (200 mg total) by mouth at lunchtime AND 2 tablets (400 mg total) at bedtime. 10/21/23  Yes   tiaGABine  (GABITRIL ) 4 MG tablet Take 1 tablet (4 mg total) by mouth in the morning and at bedtime. 08/19/23  Yes   Turmeric (QC TUMERIC COMPLEX PO) Take 1 Dose by mouth daily.   Yes [provider]  carbamazepine  (TEGRETOL -XR) 200 MG 12  hr tablet Take 1 tablet (200 mg total) by mouth in the morning AND 2 tablets (400 mg total) at bedtime. Patient not taking: Reported on 12/12/2023 04/29/23     Continuous Glucose Sensor (DEXCOM G7 SENSOR) MISC Apply 1 sensor every 10 days as directed. 12/08/23   Trixie File, MD  glucose blood (FREESTYLE LITE) test strip Use to check blood sugar 2 times daily 09/25/22   Trixie File, MD    Current Facility-Administered Medications  Medication Dose Route Frequency Provider Last Rate Last Admin   acetaminophen  (TYLENOL ) tablet 650 mg  650 mg Oral Q6H PRN Pahwani, Rinka R, MD       Or   acetaminophen  (TYLENOL ) suppository 650 mg  650 mg Rectal Q6H PRN Pahwani, Rinka R, MD       atorvastatin  (LIPITOR)  tablet 40 mg  40 mg Oral Daily Raford Lenis, MD   40 mg at 12/12/23 1055   carbamazepine  (TEGRETOL  XR) 12 hr tablet 200 mg  200 mg Oral Daily Raford Lenis, MD   200 mg at 12/12/23 1055   And   carbamazepine  (TEGRETOL  XR) 12 hr tablet 400 mg  400 mg Oral QHS Raford Lenis, MD       gabapentin  (NEURONTIN ) capsule 800 mg  800 mg Oral TID Raford Lenis, MD   800 mg at 12/12/23 1055   insulin  aspart (novoLOG ) injection 0-15 Units  0-15 Units Subcutaneous TID WC Pahwani, Rinka R, MD   2 Units at 12/12/23 1126   insulin  aspart (novoLOG ) injection 0-5 Units  0-5 Units Subcutaneous QHS Pahwani, Rinka R, MD       lamoTRIgine  (LAMICTAL ) tablet 300 mg  300 mg Oral QHS Raford Lenis, MD   300 mg at 12/12/23 9972   lisinopril  (ZESTRIL ) tablet 40 mg  40 mg Oral Daily Raford Lenis, MD   40 mg at 12/12/23 1055   pantoprazole  (PROTONIX ) injection 40 mg  40 mg Intravenous Q12H Pahwani, Rinka R, MD   40 mg at 12/12/23 1125   spironolactone  (ALDACTONE ) tablet 25 mg  25 mg Oral Daily Raford Lenis, MD   25 mg at 12/12/23 1055   tiaGABine  (GABITRIL ) tablet 4 mg  4 mg Oral BID Raford Lenis, MD   4 mg at 12/12/23 1055    Allergies as of 12/11/2023 - Review Complete 12/11/2023  Allergen Reaction Noted   Zofran   [ondansetron ] Other (See Comments) 11/21/2021    Family History  Problem Relation Age of Onset   Hyperlipidemia Mother    Diabetes Mother    Anxiety disorder Mother    Heart disease Mother    Hypertension Father    Lupus Father    Heart disease Father    Asthma Father    Colon polyps Father 32   Stroke Maternal Aunt    Seizures Paternal Grandfather    Stroke Paternal Grandfather    Mental illness Paternal Grandfather    Breast cancer Neg Hx    Colon cancer Neg Hx    Esophageal cancer Neg Hx    Stomach cancer Neg Hx    Rectal cancer Neg Hx     Social History   Socioeconomic History   Marital status: Married    Spouse name: Not on file   Number of children: 1   Years of education: Not on file   Highest education level: Associate degree: academic program  Occupational History   Occupation: nursing  Tobacco Use   Smoking status: Never   Smokeless tobacco: Never  Vaping Use   Vaping status: Never Used  Substance and Sexual Activity   Alcohol use: No    Alcohol/week: 0.0 standard drinks of alcohol   Drug use: No   Sexual activity: Yes    Partners: Male  Other Topics Concern   Not on file  Social History Narrative   Not on file   Social Drivers of Health   Financial Resource Strain: Low Risk  (12/24/2022)   Overall Financial Resource Strain (CARDIA)    Difficulty of Paying Living Expenses: Not hard at all  Food Insecurity: No Food Insecurity (12/12/2023)   Hunger Vital Sign    Worried About Running Out of Food in the Last Year: Never true    Ran Out of Food in the Last Year: Never true  Transportation Needs: No Transportation Needs (12/12/2023)  PRAPARE - Administrator, Civil Service (Medical): No    Lack of Transportation (Non-Medical): No  Physical Activity: Unknown (12/24/2022)   Exercise Vital Sign    Days of Exercise per Week: 0 days    Minutes of Exercise per Session: Not on file  Stress: No Stress Concern Present (12/24/2022)   [employer] of Occupational Health - Occupational Stress Questionnaire    Feeling of Stress : Not at all  Social Connections: Moderately Isolated (12/24/2022)   Social Connection and Isolation Panel    Frequency of Communication with Friends and Family: Twice a week    Frequency of Social Gatherings with Friends and Family: Once a week    Attends Religious Services: Never    Database administrator or Organizations: No    Attends Engineer, structural: Not on file    Marital Status: Married  Catering manager Violence: Not At Risk (12/12/2023)   Humiliation, Afraid, Rape, and Kick questionnaire    Fear of Current or Ex-Partner: No    Emotionally Abused: No    Physically Abused: No    Sexually Abused: No    Review of Systems: Pertinent positive and negative review of systems were noted in the above HPI section.  All other review of systems was otherwise negative.   Physical Exam: Vital signs in last 24 hours: Temp:  [97.6 F (36.4 C)-98.8 F (37.1 C)] 98.5 F (36.9 C) (07/19 0930) Pulse Rate:  [66-93] 67 (07/19 0930) Resp:  [14-20] 17 (07/19 0930) BP: (143-159)/(65-83) 158/83 (07/19 0930) SpO2:  [97 %-99 %] 97 % (07/19 0930) Weight:  [132 kg] 132 kg (07/18 1809)   General:   Alert,  Well-developed, well-nourished, pleasant and cooperative in NAD Head:  Normocephalic and atraumatic. Eyes:  Sclera clear, no icterus.   Conjunctiva pink. Ears:  Normal auditory acuity. Nose:  No deformity, discharge,  or lesions. Mouth:  No deformity or lesions.   Neck:  Supple; no masses or thyromegaly. Lungs:  Clear throughout to auscultation.   No wheezes, crackles, or rhonchi.  Heart:  Regular rate and rhythm; no murmurs, clicks, rubs,  or gallops. Abdomen:  Soft, obese, nontender, BS active,nonpalp mass or hsm.   Rectal: Not repeated, documented Hemoccult positive yesterday Msk:  Symmetrical without gross deformities. . Pulses:  Normal pulses noted. Extremities:  Without clubbing or  edema. Neurologic:  Alert and  oriented x4;  grossly normal neurologically. Skin:  Intact without significant lesions or rashes.. Psych:  Alert and cooperative. Normal mood and affect.  Intake/Output from previous day: 07/18 0701 - 07/19 0700 In: 500.2 [IV Piggyback:500.2] Out: -  Intake/Output this shift: No intake/output data recorded.  Lab Results: Recent Labs    12/11/23 1818  WBC 8.7  HGB 9.6*  HCT 27.9*  PLT 296   BMET Recent Labs    12/11/23 1818  NA 123*  K 4.4  CL 88*  CO2 21*  GLUCOSE 163*  BUN 13  CREATININE 0.54  CALCIUM  8.6*   LFT Recent Labs    12/11/23 1818  PROT 6.5  ALBUMIN 4.1  AST 31  ALT 26  ALKPHOS 93  BILITOT <0.2   PT/INR No results for input(s): LABPROT, INR in the last 72 hours. Hepatitis Panel No results for input(s): HEPBSAG, HCVAB, HEPAIGM, HEPBIGM in the last 72 hours.   IMPRESSION:  #77 53 year old white female with weakness and fatigue, intermittent lightheadedness over the past couple of weeks.  She had 1 very dark stool about  4 days ago but other than that has not noticed any melena or hematochezia.  She was noted to have a hemoglobin of 9.6 yesterday in the ER down from her baseline of 13 in October 2024. She was documented Hemoccult positive  Patient is on chronic NSAIDs, and on baby aspirin daily She is status post Roux-en-Y gastric bypass  Rule out anastomotic ulceration, chronic gastropathy, NSAID induced peptic ulcer disease  He is up-to-date with colonoscopy done in December 2021 with no polyps, noted scattered tics and internal hemorrhoids.  #2 anemia normocytic but with iron studies consistent with early iron deficiency with ferritin of 23  #3 adult onset diabetes mellitus #4.  Depression and bipolar disorder  #5 hyponatremia-123 on admit needs repeated pending    PLAN: Currently n.p.o. Continue IV PPI twice daily Will discuss timing of EGD , not sure can be accommodated today.  Discussed  with with patient she is aware, if we cannot get her procedure done today then will allow her oral intake until midnight and then n.p.o. after midnight Stat hemoglobin now and will need results of pending BMET GI will follow with you    Jeremia Groot EsterwoodPA-C  12/12/2023, 11:54 AM

## 2023-12-13 ENCOUNTER — Inpatient Hospital Stay (HOSPITAL_COMMUNITY): Payer: Self-pay | Admitting: Anesthesiology

## 2023-12-13 ENCOUNTER — Encounter (HOSPITAL_COMMUNITY)
Admission: EM | Disposition: A | Discharge status: Home / Self Care | Payer: Self-pay | Source: Home / Self Care | Attending: Internal Medicine

## 2023-12-13 ENCOUNTER — Encounter (HOSPITAL_COMMUNITY): Payer: Self-pay | Admitting: Internal Medicine

## 2023-12-13 DIAGNOSIS — K2970 Gastritis, unspecified, without bleeding: Secondary | ICD-10-CM

## 2023-12-13 DIAGNOSIS — Z6841 Body Mass Index (BMI) 40.0 and over, adult: Secondary | ICD-10-CM

## 2023-12-13 DIAGNOSIS — G4733 Obstructive sleep apnea (adult) (pediatric): Secondary | ICD-10-CM | POA: Diagnosis not present

## 2023-12-13 DIAGNOSIS — D649 Anemia, unspecified: Secondary | ICD-10-CM

## 2023-12-13 DIAGNOSIS — K2961 Other gastritis with bleeding: Secondary | ICD-10-CM

## 2023-12-13 HISTORY — PX: ESOPHAGOGASTRODUODENOSCOPY: SHX5428

## 2023-12-13 HISTORY — PX: BONE BIOPSY: SHX375

## 2023-12-13 LAB — GLUCOSE, CAPILLARY
Glucose-Capillary: 101 mg/dL — ABNORMAL HIGH (ref 70–99)
Glucose-Capillary: 135 mg/dL — ABNORMAL HIGH (ref 70–99)
Glucose-Capillary: 178 mg/dL — ABNORMAL HIGH (ref 70–99)
Glucose-Capillary: 157 mg/dL — ABNORMAL HIGH (ref 70–99)
Glucose-Capillary: 242 mg/dL — ABNORMAL HIGH (ref 70–99)

## 2023-12-13 LAB — COMPREHENSIVE METABOLIC PANEL WITH GFR
ALT: 23 U/L (ref 0–44)
AST: 27 U/L (ref 15–41)
Albumin: 3.2 g/dL — ABNORMAL LOW (ref 3.5–5.0)
Alkaline Phosphatase: 78 U/L (ref 38–126)
Anion gap: 9 (ref 5–15)
BUN: 6 mg/dL (ref 6–20)
CO2: 24 mmol/L (ref 22–32)
Calcium: 9.2 mg/dL (ref 8.9–10.3)
Chloride: 99 mmol/L (ref 98–111)
Creatinine, Ser: 0.43 mg/dL — ABNORMAL LOW (ref 0.44–1.00)
GFR, Estimated: >60 mL/min (ref >60)
Glucose, Bld: 117 mg/dL — ABNORMAL HIGH (ref 70–99)
Potassium: 4.4 mmol/L (ref 3.5–5.1)
Sodium: 132 mmol/L — ABNORMAL LOW (ref 135–145)
Total Bilirubin: 0.4 mg/dL (ref 0.0–1.2)
Total Protein: 5.9 g/dL — ABNORMAL LOW (ref 6.5–8.1)

## 2023-12-13 LAB — CBC
HCT: 29.7 % — ABNORMAL LOW (ref 36.0–46.0)
Hemoglobin: 9.8 g/dL — ABNORMAL LOW (ref 12.0–15.0)
MCH: 30.4 pg (ref 26.0–34.0)
MCHC: 33 g/dL (ref 30.0–36.0)
MCV: 92.2 fL (ref 80.0–100.0)
Platelets: 313 K/uL (ref 150–400)
RBC: 3.22 MIL/uL — ABNORMAL LOW (ref 3.87–5.11)
RDW: 12.3 % (ref 11.5–15.5)
WBC: 8.1 K/uL (ref 4.0–10.5)
nRBC: 0 % (ref 0.0–0.2)

## 2023-12-13 SURGERY — EGD (ESOPHAGOGASTRODUODENOSCOPY)
Anesthesia: Monitor Anesthesia Care

## 2023-12-13 MED ORDER — PANTOPRAZOLE SODIUM 40 MG PO TBEC
40.0000 mg | DELAYED_RELEASE_TABLET | Freq: Two times a day (BID) | ORAL | Status: DC
  Administered 2023-12-13 – 2023-12-14 (×2): 40 mg via ORAL
  Filled 2023-12-13 (×2): qty 1

## 2023-12-13 MED ORDER — GLYCOPYRROLATE PF 0.2 MG/ML IJ SOSY
PREFILLED_SYRINGE | INTRAMUSCULAR | Status: DC | PRN
  Administered 2023-12-13: .1 mg via INTRAVENOUS

## 2023-12-13 MED ORDER — HYDROCORTISONE 1 % EX CREA
TOPICAL_CREAM | Freq: Three times a day (TID) | CUTANEOUS | Status: DC
  Filled 2023-12-13 (×2): qty 28

## 2023-12-13 MED ORDER — PROPOFOL 500 MG/50ML IV EMUL
INTRAVENOUS | Status: DC | PRN
  Administered 2023-12-13: 120 ug/kg/min via INTRAVENOUS

## 2023-12-13 MED ORDER — PROPOFOL 10 MG/ML IV BOLUS
INTRAVENOUS | Status: DC | PRN
Start: 2023-12-13 — End: 2023-12-13
  Administered 2023-12-13: 50 mg via INTRAVENOUS
  Administered 2023-12-13: 20 mg via INTRAVENOUS
  Administered 2023-12-13: 30 mg via INTRAVENOUS
  Administered 2023-12-13: 20 mg via INTRAVENOUS
  Administered 2023-12-13: 50 mg via INTRAVENOUS

## 2023-12-13 MED ORDER — HYDROCORTISONE 1 % EX CREA
TOPICAL_CREAM | Freq: Three times a day (TID) | CUTANEOUS | Status: DC
  Filled 2023-12-13: qty 28

## 2023-12-13 MED ORDER — LIDOCAINE 2% (20 MG/ML) 5 ML SYRINGE
INTRAMUSCULAR | Status: DC | PRN
  Administered 2023-12-13: 80 mg via INTRAVENOUS

## 2023-12-13 MED ORDER — SODIUM CHLORIDE 0.9 % IV SOLN: INTRAVENOUS | Status: DC | PRN

## 2023-12-13 NOTE — Op Note (Signed)
 [facility] Patient Name: [name] Procedure Date : 12/13/2023 MRN: [id number] Attending MD: Inocente Hausen , MD, [id number] Date of Birth: 1970/11/12 CSN: [id number] Age: 53 Admit Type: Inpatient Procedure:                Upper GI endoscopy Indications:              Acute post hemorrhagic anemia, Melena, History of                            NSAID use Providers:                Inocente Hausen, MD, Collene Edu, RN, Farris Southgate,                            Technician Referring MD:              Medicines:                Monitored Anesthesia Care Complications:            No immediate complications. Estimated blood loss:                            Minimal. Estimated Blood Loss:     Estimated blood loss was minimal. Procedure:                Pre-Anesthesia Assessment:                           - Prior to the procedure, a History and Physical                            was performed, and patient medications and                            allergies were reviewed. The patient's tolerance of                            previous anesthesia was also reviewed. The risks                            and benefits of the procedure and the sedation                            options and risks were discussed with the patient.                            All questions were answered, and informed consent                            was obtained. Prior Anticoagulants: The patient has                            taken no anticoagulant or antiplatelet agents                            except for aspirin.  ASA Grade Assessment: III - A                            patient with severe systemic disease. After                            reviewing the risks and benefits, the patient was                            deemed in satisfactory condition to undergo the                            procedure.                           After obtaining informed consent, the endoscope was                             passed under direct vision. Throughout the                            procedure, the patient's blood pressure, pulse, and                            oxygen saturations were monitored continuously. The                            [id number]-H190 ([id number] Olympus endoscope was introduced                            through the mouth, and advanced to the jejunum. The                            upper GI endoscopy was accomplished without                            difficulty. The patient tolerated the procedure                            well. Scope In: Scope Out: Findings:      The examined esophagus was normal.      Evidence of a Roux-en-Y gastrojejunostomy was found. The gastrojejunal       anastomosis was characterized by healthy appearing mucosa.      Localized mild inflammation characterized by congestion (edema) and       erythema was found in the gastric pouch. No ulcers were seen. Biopsies       were taken with a cold forceps for Helicobacter pylori testing.       Retroflexion in the gastric pouch was normal.      Both jejunal limbs were intubated and were overall normal. There was one       area of slightly eroded mucosa without bleeding in one of the jejunal       limbs.      There was no evidence of active bleeding or stigmata of recent bleeding  seen during today's exam. Impression:               - Normal esophagus.                           - Roux-en-Y gastrojejunostomy with gastrojejunal                            anastomosis characterized by healthy appearing                            mucosa.                           - Gastritis. Biopsied. Suspect this is NSAID                            induced. Biopsies performed to rule out H. pylori.                           - Normal jejunal limbs with the exception of one                            area of slightly eroded mucosa in one of the                            jejunal limbs without active bleeding.                           -  Gastritis and the area of slightly eroded mucosa                            in one of the jejunal limbs may have contributed to                            the patient's presentation of anemia and melena.                            Suspect this is NSAID induced. Recommendation:           - Return patient to hospital ward for ongoing care.                           - Check repeat hemoglobin today to confirm                            stability compared to yesterday's value.                           - Follow-up pathology results.                           - In the absence of active bleeding, patient can  resume a regular diet.                           - In the setting of gastritis, recommend patient                            start pantoprazole  40 mg orally daily at the time                            of discharge. If patient continues to require daily                            NSAIDs for management of musculoskeletal pain would                            be reasonable to continue long-term PPI.                           - Patient can discuss with PCP or provider managing                            her musculoskeletal pain if there are other                            non-NSAID options that would be preferable.                           - If hemoglobin is stable today without further                            evidence of overt bleeding and patient is                            tolerating a regular diet could consider discharge                            home later today or tomorrow. Procedure Code(s):        --- Professional ---                           [phone], Esophagogastroduodenoscopy, flexible,                            transoral; with biopsy, single or multiple Diagnosis Code(s):        --- Professional ---                           Z98.0, Intestinal bypass and anastomosis status                           K29.70, Gastritis, unspecified, without bleeding                            D62, Acute posthemorrhagic anemia  K92.1, Melena (includes Hematochezia) CPT copyright 2022 American Medical Association. All rights reserved. The codes documented in this report are preliminary and upon coder review may  be revised to meet current compliance requirements. Inocente Hausen, MD 12/13/2023 8:25:16 AM This report has been signed electronically. Number of Addenda: 0

## 2023-12-13 NOTE — Transfer of Care (Signed)
 Immediate Anesthesia Transfer of Care Note  Patient: [name]  Procedure(s) Performed: EGD (ESOPHAGOGASTRODUODENOSCOPY) BIOPSY, GI  Patient Location: PACU  Anesthesia Type:MAc  Level of Consciousness: awake  Airway & Oxygen Therapy: Patient Spontanous Breathing  Post-op Assessment: Report given to RN and Post -op Vital signs reviewed and stable  Post vital signs: Reviewed and stable  Last Vitals:  Vitals Value Taken Time  BP 124/49 12/13/23 08:12  Temp 36.4 C 12/13/23 08:12  Pulse 87 12/13/23 08:13  Resp 22 12/13/23 08:13  SpO2 97 % 12/13/23 08:13  Vitals shown include unfiled device data.  Last Pain:  Vitals:   12/13/23 0812  TempSrc:   PainSc: 0-No pain         Complications: No notable events documented.

## 2023-12-13 NOTE — Progress Notes (Signed)
 PROGRESS NOTE    [name]  FMW:[id number] DOB: 11/07/1970 DOA: 12/11/2023 PCP: Seabron Lenis, MD    Brief Narrative:  53 year old with history of type 2 diabetes, depression, bipolar disorder, hypertension, hyperlipidemia, GERD and obesity status post bariatric surgery, hypothyroidism presents to the emergency department with worsening progressive weakness and fatigue for about 2 weeks.  Takes aspirin 81 mg daily and Aleve  every day.  In the emergency room hemodynamically stable.  Hemoglobin 9.6, platelets 296.  Sodium 123.  Occult blood positive.  Admitted with GI consultation.  Subjective: Seen and examined, underwent upper GI endoscopy.  Currently denies any complaints.  Eating breakfast after procedure.  Patient stated long history of dizziness after standing.  She also likely has venous insufficiency. Assessment & Plan:   Upper GI bleed with acute blood loss anemia: NSAID induced gastritis.  Discontinue NSAIDs, continue aspirin. Hemoglobin baseline 13-9.6-stabilize since then.  Recheck tomorrow morning. PPI twice daily. Underwent upper GI endoscopy today, found to have gastritis without evidence of active bleeding. PPI twice daily, monitor, anticipate home tomorrow morning.  Hypochloremic hyponatremia: With history of chronic hyponatremia.  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  Dementia stable.  No further intervention needed.  Recheck tomorrow morning.  Essential hypertension: Blood pressure stable on lisinopril  and Aldactone . Check orthostatic blood pressures with history of dizziness. Wear compression socks.  Type 2 diabetes: On metformin  and Trulicity .  Resume on discharge.  Bipolar disorder depression and anxiety: Resume home medications including  gabapentin , Lamictal .  Morbid obesity with BMI 42: History of gastric bypass.  Needs to continue lifestyle modification.   DVT prophylaxis: SCDs Start: 12/12/23 1009   Code Status: Full code Family  Communication: None at the bedside Disposition Plan: Status is: Inpatient Remains inpatient appropriate because: Immediate postprocedure     Consultants:  Gastroenterology  Procedures:  Upper GI endoscopy  Antimicrobials:  None     Objective: Vitals:   12/13/23 0726 12/13/23 0812 12/13/23 0815 12/13/23 0830  BP: (!) 147/80 (!) 124/49 (!) 124/49 128/66  Pulse: 72 92 84 68  Resp: 11 18 18 13   Temp: (!) 97.4 F (36.3 C) (!) 97.5 F (36.4 C)  (!) 97.5 F (36.4 C)  TempSrc: Temporal     SpO2: 95% 96% 97% 99%  Weight:      Height:        Intake/Output Summary (Last 24 hours) at 12/13/2023 1332 Last data filed at 12/13/2023 0809 Gross per 24 hour  Intake 400 ml  Output 0 ml  Net 400 ml   Filed Weights   12/11/23 1809  Weight: 132 kg    Examination:  General: Looks fairly comfortable.  On room air. Cardiovascular: S1-S2 normal.  Regular rate rhythm. Respiratory: Bilateral clear.  No added sounds. Gastrointestinal: Soft.  Nontender.  Bowel sound present. Ext: No swelling or edema.  She has some prominent veins on her legs. Neuro: Alert awake and oriented.  No neurological deficits.      Data Reviewed: I have personally reviewed following labs and imaging studies  CBC: Recent Labs  Lab 12/11/23 1818 12/12/23 1307 12/13/23 1001  WBC 8.7  --  8.1  NEUTROABS 5.0  --   --   HGB 9.6* 10.8* 9.8*  HCT 27.9* 31.5* 29.7*  MCV 88.6  --  92.2  PLT 296  --  313   Basic Metabolic Panel: Recent Labs  Lab 12/11/23 1818 12/12/23 1221 12/13/23 1001  NA 123* 132* 132*  K 4.4 4.3 4.4  CL 88* 97*  99  CO2 21* 24 24  GLUCOSE 163* 117* 117*  BUN 13 7 6   CREATININE 0.54 0.47 0.43*  CALCIUM  8.6* 8.9 9.2   GFR: Estimated Creatinine Clearance: 120.1 mL/min (A) (by C-G formula based on SCr of 0.43 mg/dL (L)). Liver Function Tests: Recent Labs  Lab 12/11/23 1818 12/13/23 1001  AST 31 27  ALT 26 23  ALKPHOS 93 78  BILITOT <0.2 0.4  PROT 6.5 5.9*  ALBUMIN 4.1  3.2*   No results for input(s): LIPASE, AMYLASE in the last 168 hours. No results for input(s): AMMONIA in the last 168 hours. Coagulation Profile: No results for input(s): INR, PROTIME in the last 168 hours. Cardiac Enzymes: No results for input(s): CKTOTAL, CKMB, CKMBINDEX, TROPONINI in the last 168 hours. BNP (last 3 results) No results for input(s): PROBNP in the last 8760 hours. HbA1C: No results for input(s): HGBA1C in the last 72 hours. CBG: Recent Labs  Lab 12/12/23 1637 12/12/23 2042 12/13/23 0732 12/13/23 0813 12/13/23 1210  GLUCAP 120* 201* 101* 135* 178*   Lipid Profile: No results for input(s): CHOL, HDL, LDLCALC, TRIG, CHOLHDL, LDLDIRECT in the last 72 hours. Thyroid  Function Tests: Recent Labs    12/11/23 2230  TSH 1.390  FREET4 0.90   Anemia Panel: Recent Labs    12/11/23 1818 12/11/23 1958 12/11/23 2000  VITAMINB12  --  1,498*  --   FOLATE  --   --  24.2  FERRITIN  --  23  --   TIBC  --  332  --   IRON  --  60  --   RETICCTPCT 2.3  --   --    Sepsis Labs: No results for input(s): PROCALCITON, LATICACIDVEN in the last 168 hours.  No results found for this or any previous visit (from the past 240 hours).       Radiology Studies: DG Chest Portable 1 View Result Date: 12/11/2023 EXAM: 1 VIEW XRAY OF THE CHEST 12/11/2023 09:12:31 PM COMPARISON: 02/29/2016 CLINICAL HISTORY: Near syncope. Pt referred to the ED by her PCP. She has been having progressive weakness and fatigue for two weeks and her PCP obtained labs. Pt was notified of a low sodium and low iron. Denies ShOB, CP. FINDINGS: LUNGS AND PLEURA: No focal pulmonary opacity. No pulmonary edema. No pleural effusion. No pneumothorax. HEART AND MEDIASTINUM: No acute abnormality of the cardiac and mediastinal silhouettes. BONES AND SOFT TISSUES: No acute osseous abnormality. IMPRESSION: 1. No acute process. Electronically signed by: Pinkie Pebbles MD 12/11/2023  09:17 PM EDT RP Workstation: [id number]        Scheduled Meds:  atorvastatin   40 mg Oral Daily   carbamazepine   200 mg Oral Daily   And   carbamazepine   400 mg Oral QHS   gabapentin   800 mg Oral TID   insulin  aspart  0-15 Units Subcutaneous TID WC   insulin  aspart  0-5 Units Subcutaneous QHS   lamoTRIgine   300 mg Oral QHS   lisinopril   40 mg Oral Daily   pantoprazole  (PROTONIX ) IV  40 mg Intravenous Q12H   spironolactone   25 mg Oral Daily   tiaGABine   4 mg Oral BID   Continuous Infusions:   LOS: 1 day    Time spent: 40 minutes    Renato Applebaum, MD [facility]

## 2023-12-13 NOTE — Plan of Care (Signed)
  Problem: Health Behavior/Discharge Planning: Goal: Ability to manage health-related needs will improve Outcome: Progressing   Problem: Clinical Measurements: Goal: Ability to maintain clinical measurements within normal limits will improve Outcome: Progressing Goal: Will remain free from infection Outcome: Progressing   Problem: Activity: Goal: Risk for activity intolerance will decrease Outcome: Progressing   Problem: Safety: Goal: Ability to remain free from injury will improve Outcome: Progressing   Problem: Coping: Goal: Ability to adjust to condition or change in health will improve Outcome: Progressing

## 2023-12-13 NOTE — H&P (Signed)
 [name]   Primary Care Physician:  Seabron Lenis, MD   Reason for Procedure:  Anemia, melena  Plan:    Upper endoscopy     HPI: [name] is a 53 y.o. female undergoing upper endoscopy for investigation of anemia and melena.  Patient is status post Roux-en-Y gastric bypass in 2017.  She was admitted to the hospital with progressive fatigue, anemia hgb 9.6 (baseline 13) and dark stools. Denies abdominal pain. Had mild nausea yesterday but no vomiting. Takes Aleve  at least once on a daily basis for back pain and sometimes increases this. No prior history of GI bleeding. States that she had significant GERD in the past but this resolved after gastric bypass. Last EGD in 2014 was normal. She takes a baby aspirin but is not on anticoagulation.   Last hemoglobin checked yesterday stable at 10.8  Past Medical History:  Diagnosis Date   Allergy    seasonal allergies   Anemia    hx of   Anxiety    on meds   Asthma    hx of   Bipolar disorder (HCC)    Cataract    bilateral sx   Chicken pox    Complication of anesthesia    oxygen saturation dropped after hysterectomy 2009 at [facility]   Depression    on meds   Diabetes mellitus    on meds   Dyspnea    GERD (gastroesophageal reflux disease)    hx of   Hyperlipidemia    on meds   Hypertension    readings   Hypothyroidism    hx of-LEFT thyroid  removed   Irregular heartbeat    saw dr Micky sees -cardiology as needed   Migraine    Sleep apnea    uses cpap    Past Surgical History:  Procedure Laterality Date   ANKLE SURGERY Left 1989   BACK SURGERY  1999   CATARACT EXTRACTION, BILATERAL     CESAREAN SECTION     1995   LAPAROSCOPIC ASSISTED VAGINAL HYSTERECTOMY  2009     BSO fibroids, DUB, pelvic pain   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 REPAIR N/A 04/14/2016   Procedure: LAPAROSCOPIC ROUX-EN-Y GASTRIC BYPASS  WITH UPPER ENDOSCOPY;  Surgeon: Morene Olives,  MD;  Location: [facility];  Service: General;  Laterality: N/A;   SHOULDER ARTHROSCOPY WITH OPEN ROTATOR CUFF REPAIR Right 01/02/2017   Procedure: Right shoulder arthroscopy, A-subcromial decompression, mini open rotator cuff repair, open distal clavicle resection, biceps tenodesis;  Surgeon: Kay Kemps, MD;  Location: [facility];  Service: Orthopedics;  Laterality: Right;   THYROIDECTOMY, PARTIAL Left 2001   UMBILICAL HERNIA REPAIR  2003   WISDOM TOOTH EXTRACTION      Prior to Admission medications   Medication Sig Start Date End Date Taking? Authorizing Provider  acetaminophen  (TYLENOL ) 500 MG tablet Take 500-1,000 mg by mouth every 6 (six) hours as needed for moderate pain (pain score 4-6).   Yes [provider]  atorvastatin  (LIPITOR) 40 MG tablet Take 1 tablet (40 mg total) by mouth daily. 09/14/23  Yes Tabori, Katherine E, MD  Biotin 89999 MCG TABS Take 1 tablet by mouth daily.   Yes [provider]  cyclobenzaprine  (FLEXERIL ) 10 MG tablet Take 1 tablet (10 mg total) by mouth at bedtime as needed. Patient taking differently: Take 10 mg by mouth at bedtime. 07/21/23  Yes   Dulaglutide  (TRULICITY ) 3 MG/0.5ML SOAJ Inject 3 mg into the skin once a  week. 12/08/23  Yes Trixie File, MD  gabapentin  (NEURONTIN ) 800 MG tablet Take 1 tablet (800 mg total) by mouth 3 (three) times daily. 10/08/23  Yes Jaffe, Adam R, DO  Glucosamine HCl (GLUCOSAMINE PO) Take 3 tablets by mouth daily.   Yes [provider]  lamoTRIgine  (LAMICTAL ) 200 MG tablet Take 1.5 tablets (300 mg total) by mouth at bedtime. 10/21/23  Yes   lisinopril  (ZESTRIL ) 40 MG tablet Take 1 tablet (40 mg total) by mouth daily. Patient taking differently: Take 40 mg by mouth at bedtime. 11/26/23  Yes O'Neal, Darryle Ned, MD  Loratadine (CLARITIN PO) Take 1 tablet by mouth at bedtime.   Yes [provider]  metFORMIN  (GLUCOPHAGE ) 1000 MG tablet Take 1 tablet (1,000 mg total) by mouth daily with supper. 12/08/23  12/07/24 Yes Trixie File, MD  Multiple Vitamin (MULTIVITAMIN) tablet Take 1 tablet by mouth daily.   Yes [provider]  naproxen  sodium (ALEVE ) 220 MG tablet Take 220 mg by mouth daily.   Yes [provider]  spironolactone  (ALDACTONE ) 25 MG tablet Take 1 tablet (25 mg total) by mouth daily. 08/17/23  Yes O'Neal, Darryle Ned, MD  TEGRETOL -XR 200 MG 12 hr tablet Take 1 tablet (200 mg total) by mouth in the morning AND 2 tablets (400 mg total) at bedtime. Patient taking differently: Take 1 tablet (200 mg total) by mouth at lunchtime AND 2 tablets (400 mg total) at bedtime. 10/21/23  Yes   tiaGABine  (GABITRIL ) 4 MG tablet Take 1 tablet (4 mg total) by mouth in the morning and at bedtime. 08/19/23  Yes   Turmeric (QC TUMERIC COMPLEX PO) Take 1 Dose by mouth daily.   Yes [provider]  carbamazepine  (TEGRETOL -XR) 200 MG 12 hr tablet Take 1 tablet (200 mg total) by mouth in the morning AND 2 tablets (400 mg total) at bedtime. Patient not taking: Reported on 12/12/2023 04/29/23     Continuous Glucose Sensor (DEXCOM G7 SENSOR) MISC Apply 1 sensor every 10 days as directed. 12/08/23   Trixie File, MD  glucose blood (FREESTYLE LITE) test strip Use to check blood sugar 2 times daily 09/25/22   Trixie File, MD    Current Facility-Administered Medications  Medication Dose Route Frequency Provider Last Rate Last Admin   acetaminophen  (TYLENOL ) tablet 650 mg  650 mg Oral Q6H PRN Pahwani, Rinka R, MD   650 mg at 12/12/23 1801   Or   acetaminophen  (TYLENOL ) suppository 650 mg  650 mg Rectal Q6H PRN Pahwani, Rinka R, MD       atorvastatin  (LIPITOR) tablet 40 mg  40 mg Oral Daily Raford Lenis, MD   40 mg at 12/12/23 1055   carbamazepine  (TEGRETOL  XR) 12 hr tablet 200 mg  200 mg Oral Daily Raford Lenis, MD   200 mg at 12/12/23 1055   And   carbamazepine  (TEGRETOL  XR) 12 hr tablet 400 mg  400 mg Oral QHS Raford Lenis, MD   400 mg at 12/12/23 2326   cyclobenzaprine   (FLEXERIL ) tablet 5 mg  5 mg Oral TID PRN Mansy, Jan A, MD   5 mg at 12/12/23 2326   gabapentin  (NEURONTIN ) capsule 800 mg  800 mg Oral TID Raford Lenis, MD   800 mg at 12/12/23 2141   insulin  aspart (novoLOG ) injection 0-15 Units  0-15 Units Subcutaneous TID WC Pahwani, Rinka R, MD   2 Units at 12/12/23 1126   insulin  aspart (novoLOG ) injection 0-5 Units  0-5 Units Subcutaneous QHS Pahwani, Rinka R,  MD   2 Units at 12/12/23 2140   lamoTRIgine  (LAMICTAL ) tablet 300 mg  300 mg Oral QHS Raford Lenis, MD   300 mg at 12/12/23 2141   lisinopril  (ZESTRIL ) tablet 40 mg  40 mg Oral Daily Raford Lenis, MD   40 mg at 12/12/23 1055   pantoprazole  (PROTONIX ) injection 40 mg  40 mg Intravenous Q12H Pahwani, Rinka R, MD   40 mg at 12/12/23 2139   spironolactone  (ALDACTONE ) tablet 25 mg  25 mg Oral Daily Raford Lenis, MD   25 mg at 12/12/23 1055   tiaGABine  (GABITRIL ) tablet 4 mg  4 mg Oral BID Raford Lenis, MD   4 mg at 12/12/23 2142    Allergies as of 12/11/2023 - Review Complete 12/11/2023  Allergen Reaction Noted   Zofran  [ondansetron ] Other (See Comments) 11/21/2021    Family History  Problem Relation Age of Onset   Hyperlipidemia Mother    Diabetes Mother    Anxiety disorder Mother    Heart disease Mother    Hypertension Father    Lupus Father    Heart disease Father    Asthma Father    Colon polyps Father 58   Stroke Maternal Aunt    Seizures Paternal Grandfather    Stroke Paternal Grandfather    Mental illness Paternal Grandfather    Breast cancer Neg Hx    Colon cancer Neg Hx    Esophageal cancer Neg Hx    Stomach cancer Neg Hx    Rectal cancer Neg Hx     Social History   Socioeconomic History   Marital status: Married    Spouse name: Not on file   Number of children: 1   Years of education: Not on file   Highest education level: Associate degree: academic program  Occupational History   Occupation: nursing  Tobacco Use   Smoking status: Never   Smokeless tobacco: Never   Vaping Use   Vaping status: Never Used  Substance and Sexual Activity   Alcohol use: No    Alcohol/week: 0.0 standard drinks of alcohol   Drug use: No   Sexual activity: Yes    Partners: Male  Other Topics Concern   Not on file  Social History Narrative   Not on file   Social Drivers of Health   Financial Resource Strain: Low Risk  (12/24/2022)   Overall Financial Resource Strain (CARDIA)    Difficulty of Paying Living Expenses: Not hard at all  Food Insecurity: No Food Insecurity (12/12/2023)   Hunger Vital Sign    Worried About Running Out of Food in the Last Year: Never true    Ran Out of Food in the Last Year: Never true  Transportation Needs: No Transportation Needs (12/12/2023)   PRAPARE - Administrator, Civil Service (Medical): No    Lack of Transportation (Non-Medical): No  Physical Activity: Unknown (12/24/2022)   Exercise Vital Sign    Days of Exercise per Week: 0 days    Minutes of Exercise per Session: Not on file  Stress: No Stress Concern Present (12/24/2022)   [employer] of Occupational Health - Occupational Stress Questionnaire    Feeling of Stress : Not at all  Social Connections: Moderately Isolated (12/24/2022)   Social Connection and Isolation Panel    Frequency of Communication with Friends and Family: Twice a week    Frequency of Social Gatherings with Friends and Family: Once a week    Attends Religious Services: Never  Active Member of Clubs or Organizations: No    Attends Banker Meetings: Not on file    Marital Status: Married  Intimate Partner Violence: Not At Risk (12/12/2023)   Humiliation, Afraid, Rape, and Kick questionnaire    Fear of Current or Ex-Partner: No    Emotionally Abused: No    Physically Abused: No    Sexually Abused: No    Review of Systems:  All other review of systems negative except as mentioned in the HPI.  Physical Exam: Vital signs BP (!) 142/74 (BP Location: Left Arm)   Pulse 70    Temp 97.6 F (36.4 C) (Oral)   Resp 16   Ht 5' 9 (1.753 m)   Wt 132 kg   LMP 01/25/2008   SpO2 100%   BMI 42.97 kg/m   General:   Alert,  Well-developed, well-nourished, pleasant and cooperative in NAD Lungs:  Clear throughout to auscultation.   Heart:  Regular rate and rhythm; no murmurs, clicks, rubs,  or gallops. Abdomen:  Soft, nontender and nondistended. Normal bowel sounds.   Neuro/Psych:  Normal mood and affect. A and O x 3  Inocente Hausen, MD [facility]

## 2023-12-14 ENCOUNTER — Other Ambulatory Visit (HOSPITAL_COMMUNITY): Payer: Self-pay

## 2023-12-14 ENCOUNTER — Other Ambulatory Visit (HOSPITAL_BASED_OUTPATIENT_CLINIC_OR_DEPARTMENT_OTHER): Payer: Self-pay

## 2023-12-14 DIAGNOSIS — D649 Anemia, unspecified: Secondary | ICD-10-CM | POA: Diagnosis not present

## 2023-12-14 DIAGNOSIS — K2961 Other gastritis with bleeding: Secondary | ICD-10-CM | POA: Diagnosis not present

## 2023-12-14 LAB — CBC WITH DIFFERENTIAL/PLATELET
Abs Immature Granulocytes: 0.05 K/uL (ref 0.00–0.07)
Basophils Absolute: 0.1 K/uL (ref 0.0–0.1)
Basophils Relative: 1 %
Eosinophils Absolute: 0.6 K/uL — ABNORMAL HIGH (ref 0.0–0.5)
Eosinophils Relative: 8 %
HCT: 29.6 % — ABNORMAL LOW (ref 36.0–46.0)
Hemoglobin: 10 g/dL — ABNORMAL LOW (ref 12.0–15.0)
Immature Granulocytes: 1 %
Lymphocytes Relative: 25 %
Lymphs Abs: 2 K/uL (ref 0.7–4.0)
MCH: 30.8 pg (ref 26.0–34.0)
MCHC: 33.8 g/dL (ref 30.0–36.0)
MCV: 91.1 fL (ref 80.0–100.0)
Monocytes Absolute: 0.7 K/uL (ref 0.1–1.0)
Monocytes Relative: 9 %
Neutro Abs: 4.5 K/uL (ref 1.7–7.7)
Neutrophils Relative %: 56 %
Platelets: 346 K/uL (ref 150–400)
RBC: 3.25 MIL/uL — ABNORMAL LOW (ref 3.87–5.11)
RDW: 12.3 % (ref 11.5–15.5)
WBC: 8 K/uL (ref 4.0–10.5)
nRBC: 0 % (ref 0.0–0.2)

## 2023-12-14 LAB — BASIC METABOLIC PANEL WITH GFR
Anion gap: 12 (ref 5–15)
BUN: 7 mg/dL (ref 6–20)
CO2: 24 mmol/L (ref 22–32)
Calcium: 8.7 mg/dL — ABNORMAL LOW (ref 8.9–10.3)
Chloride: 94 mmol/L — ABNORMAL LOW (ref 98–111)
Creatinine, Ser: 0.55 mg/dL (ref 0.44–1.00)
GFR, Estimated: >60 mL/min (ref >60)
Glucose, Bld: 148 mg/dL — ABNORMAL HIGH (ref 70–99)
Potassium: 4.1 mmol/L (ref 3.5–5.1)
Sodium: 130 mmol/L — ABNORMAL LOW (ref 135–145)

## 2023-12-14 LAB — MAGNESIUM: Magnesium: 1.4 mg/dL — ABNORMAL LOW (ref 1.7–2.4)

## 2023-12-14 LAB — GLUCOSE, CAPILLARY: Glucose-Capillary: 134 mg/dL — ABNORMAL HIGH (ref 70–99)

## 2023-12-14 MED ORDER — FAMOTIDINE 20 MG PO TABS
20.0000 mg | ORAL_TABLET | Freq: Two times a day (BID) | ORAL | 11 refills | Status: AC
  Filled 2023-12-14 (×2): qty 60, 30d supply, fill #0
  Filled 2024-01-06 – 2024-01-07 (×3): qty 60, 30d supply, fill #1
  Filled 2024-02-05: qty 60, 30d supply, fill #2
  Filled 2024-03-07: qty 60, 30d supply, fill #3
  Filled 2024-04-04: qty 60, 30d supply, fill #4
  Filled 2024-05-05: qty 60, 30d supply, fill #5
  Filled 2024-06-06: qty 60, 30d supply, fill #6
  Filled ????-??-??: fill #1

## 2023-12-14 MED ORDER — MAGNESIUM OXIDE -MG SUPPLEMENT 400 (240 MG) MG PO TABS
800.0000 mg | ORAL_TABLET | Freq: Once | ORAL | Status: AC
  Administered 2023-12-14: 800 mg via ORAL
  Filled 2023-12-14: qty 2

## 2023-12-14 MED ORDER — MAGNESIUM OXIDE 400 MG PO TABS
400.0000 mg | ORAL_TABLET | Freq: Every day | ORAL | 0 refills | Status: AC
Start: 2023-12-14 — End: 2024-03-13
  Filled 2023-12-14: qty 120, 90d supply, fill #0

## 2023-12-14 NOTE — Plan of Care (Signed)

## 2023-12-14 NOTE — Discharge Summary (Signed)
 Physician Discharge Summary  [name] FMW:[id number] DOB: 07-03-1970 DOA: 12/11/2023  PCP: Seabron Lenis, MD  Admit date: 12/11/2023 Discharge date: 12/14/2023  Admitted From: Home Disposition: Home  Recommendations for Outpatient Follow-up:  Follow up with PCP in 1-2 weeks Please obtain BMP/CBC in one week   Discharge Condition: Stable CODE STATUS: Full code Diet recommendation: Low-salt and low-carb diet  Discharge summary: 53 year old with history of type 2 diabetes, depression, bipolar disorder, hypertension, hyperlipidemia, GERD and obesity status post bariatric surgery, hypothyroidism presents to the emergency department with worsening progressive weakness and fatigue for about 2 weeks. Takes aspirin 81 mg daily and Aleve  every day. In the emergency room hemodynamically stable. Hemoglobin 9.6, platelets 296. Sodium 123. Occult blood positive. Admitted with GI consultation.   Upper GI bleed with acute blood loss anemia: NSAID induced gastritis.  Discontinue NSAIDs, continue aspirin. Hemoglobin baseline 13-9.6-10 and stabilized since then.   Underwent upper GI endoscopy, found to have gastritis without evidence of active bleeding.  H. pylori pending. With evidence of gastritis but no evidence of active bleeding, discharging patient home on Pepcid  20 mg twice daily to continue.  Avoiding Protonix  with chronic hypomagnesemia.  Avoid all NSAIDs. Iron panels were normal.   Hypochloremic hyponatremia: With history of chronic hyponatremia.  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  No further intervention needed.  Advised to avoid compulsive water  drinking.   Essential hypertension: Blood pressure stable on lisinopril  and Aldactone .  There is no evidence of orthostatic dizziness.  Recommended compression socks to avoid dizziness after prolonged standing.   Type 2 diabetes: On metformin  and Trulicity .  Resume on discharge.   Bipolar disorder depression and anxiety: Resume home  medications including  gabapentin , Lamictal .   Hypomagnesemia: Historically low magnesium .  Magnesium  1.4 today.  Potassium is adequate.  Replaced before discharge.  Will keep patient on scheduled magnesium  400 mg daily.  Given fairly stable gastric erosions but no active ulcers, advised H2 blocker twice daily instead of PPI.  Stable for discharge.  Will need repeat hemoglobin in about 1 week.  Discharge Diagnoses:  Principal Problem:   GI bleed Active Problems:   HYPERLIPIDEMIA, MIXED   Morbid obesity (HCC)   Bipolar disorder (HCC)   Obstructive sleep apnea   Migraine headache   Essential hypertension   GERD   Type 2 diabetes mellitus without complications (HCC)   S/P bariatric surgery   Symptomatic anemia    Discharge Instructions  Discharge Instructions     Diet - low sodium heart healthy   Complete by: As directed    Increase activity slowly   Complete by: As directed       Allergies as of 12/14/2023       Reactions   Zofran  [ondansetron ] Other (See Comments)   Severe headaches         Medication List     STOP taking these medications    naproxen  sodium 220 MG tablet Commonly known as: ALEVE        TAKE these medications    acetaminophen  500 MG tablet Commonly known as: TYLENOL  Take 500-1,000 mg by mouth every 6 (six) hours as needed for moderate pain (pain score 4-6).   atorvastatin  40 MG tablet Commonly known as: LIPITOR Take 1 tablet (40 mg total) by mouth daily.   Biotin 10000 MCG Tabs Take 1 tablet by mouth daily.   carbamazepine  200 MG 12 hr tablet Commonly known as: TEGretol -XR Take 1 tablet (200 mg total) by mouth in the morning AND 2  tablets (400 mg total) at bedtime. What changed: Another medication with the same name was changed. Make sure you understand how and when to take each.   TEGretol -XR 200 MG 12 hr tablet Generic drug: carbamazepine  Take 1 tablet (200 mg total) by mouth in the morning AND 2 tablets (400 mg total) at  bedtime. What changed: See the new instructions.   CLARITIN PO Take 1 tablet by mouth at bedtime.   cyclobenzaprine  10 MG tablet Commonly known as: FLEXERIL  Take 1 tablet (10 mg total) by mouth at bedtime as needed. What changed: when to take this   Dexcom G7 Sensor Misc Apply 1 sensor every 10 days as directed.   famotidine  20 MG tablet Commonly known as: PEPCID  Take 1 tablet (20 mg total) by mouth 2 (two) times daily.   FREESTYLE LITE test strip Generic drug: glucose blood Use to check blood sugar 2 times daily   gabapentin  800 MG tablet Commonly known as: Neurontin  Take 1 tablet (800 mg total) by mouth 3 (three) times daily.   GLUCOSAMINE PO Take 3 tablets by mouth daily.   lamoTRIgine  200 MG tablet Commonly known as: LAMICTAL  Take 1.5 tablets (300 mg total) by mouth at bedtime.   lisinopril  40 MG tablet Commonly known as: ZESTRIL  Take 1 tablet (40 mg total) by mouth daily. What changed: when to take this   Magnesium  400 MG Tabs Take 400 mg by mouth daily.   metFORMIN  1000 MG tablet Commonly known as: GLUCOPHAGE  Take 1 tablet (1,000 mg total) by mouth daily with supper.   multivitamin tablet Take 1 tablet by mouth daily.   QC TUMERIC COMPLEX PO Take 1 Dose by mouth daily.   spironolactone  25 MG tablet Commonly known as: ALDACTONE  Take 1 tablet (25 mg total) by mouth daily.   tiaGABine  4 MG tablet Commonly known as: GABITRIL  Take 1 tablet (4 mg total) by mouth in the morning and at bedtime.   Trulicity  3 MG/0.5ML Soaj Generic drug: Dulaglutide  Inject 3 mg into the skin once a week.        Allergies  Allergen Reactions   Zofran  [Ondansetron ] Other (See Comments)    Severe headaches     Consultations: Gastroenterology   Procedures/Studies: US  LIMITED JOINT SPACE STRUCTURES LOW LEFT(NO LINKED CHARGES) Result Date: 12/13/2023 Procedure: Real-time Ultrasound Guided Injection of left medial cuneiform Device: GE Logiq Q7 Ultrasound guided  injection is preferred based studies that show increased duration, increased effect, greater accuracy, decreased procedural pain, increased response rate, and decreased cost with ultrasound guided versus blind injection. Verbal informed consent obtained. Time-out conducted. Noted no overlying erythema, induration, or other signs of local infection. Skin prepped in a sterile fashion. Local anesthesia: Topical Ethyl chloride. With sterile technique and under real time ultrasound guidance: With a 25-gauge half inch needle injecting 0.5 cc and 0.5 cc of Kenalog 40 mg/mL Completed without difficulty Pain immediately resolved suggesting accurate placement of the medication. Advised to call if fevers/chills, erythema, induration, drainage, or persistent bleeding. Images saved Impression: Technically successful ultrasound guided injection.       DG Chest Portable 1 View Result Date: 12/11/2023 EXAM: 1 VIEW XRAY OF THE CHEST 12/11/2023 09:12:31 PM COMPARISON: 02/29/2016 CLINICAL HISTORY: Near syncope. Pt referred to the ED by her PCP. She has been having progressive weakness and fatigue for two weeks and her PCP obtained labs. Pt was notified of a low sodium and low iron. Denies ShOB, CP. FINDINGS: LUNGS AND PLEURA: No focal pulmonary opacity. No pulmonary edema. No  pleural effusion. No pneumothorax. HEART AND MEDIASTINUM: No acute abnormality of the cardiac and mediastinal silhouettes. BONES AND SOFT TISSUES: No acute osseous abnormality. IMPRESSION: 1. No acute process. Electronically signed by: Pinkie Pebbles MD 12/11/2023 09:17 PM EDT RP Workstation: [id number]   (Echo, Carotid, EGD, Colonoscopy, ERCP)    Subjective: Seen and examined.  Denies any complaints.  Eager to go home.   Discharge Exam: Vitals:   12/14/23 0455 12/14/23 0723  BP: 137/77 136/69  Pulse: 63 67  Resp:  16  Temp: 97.6 F (36.4 C) 97.6 F (36.4 C)  SpO2: 97% 98%   Vitals:   12/13/23 1627 12/13/23 2025 12/14/23 0455 12/14/23  0723  BP: (!) 178/78 (!) 165/80 137/77 136/69  Pulse: 79 78 63 67  Resp: 16   16  Temp: 98 F (36.7 C) 98.3 F (36.8 C) 97.6 F (36.4 C) 97.6 F (36.4 C)  TempSrc: Oral Oral Oral Oral  SpO2: 97% 97% 97% 98%  Weight:      Height:        General: Pt is alert, awake, not in acute distress Cardiovascular: RRR, S1/S2 +, no rubs, no gallops Respiratory: CTA bilaterally, no wheezing, no rhonchi Abdominal: Soft, NT, ND, bowel sounds + Extremities: no edema, no cyanosis    The results of significant diagnostics from this hospitalization (including imaging, microbiology, ancillary and laboratory) are listed below for reference.     Microbiology: No results found for this or any previous visit (from the past 240 hours).   Labs: BNP (last 3 results) No results for input(s): BNP in the last 8760 hours. Basic Metabolic Panel: Recent Labs  Lab 12/11/23 1818 12/12/23 1221 12/13/23 1001 12/14/23 0659  NA 123* 132* 132* 130*  K 4.4 4.3 4.4 4.1  CL 88* 97* 99 94*  CO2 21* 24 24 24   GLUCOSE 163* 117* 117* 148*  BUN 13 7 6 7   CREATININE 0.54 0.47 0.43* 0.55  CALCIUM  8.6* 8.9 9.2 8.7*  MG  --   --   --  1.4*   Liver Function Tests: Recent Labs  Lab 12/11/23 1818 12/13/23 1001  AST 31 27  ALT 26 23  ALKPHOS 93 78  BILITOT <0.2 0.4  PROT 6.5 5.9*  ALBUMIN 4.1 3.2*   No results for input(s): LIPASE, AMYLASE in the last 168 hours. No results for input(s): AMMONIA in the last 168 hours. CBC: Recent Labs  Lab 12/11/23 1818 12/12/23 1307 12/13/23 1001 12/14/23 0659  WBC 8.7  --  8.1 8.0  NEUTROABS 5.0  --   --  4.5  HGB 9.6* 10.8* 9.8* 10.0*  HCT 27.9* 31.5* 29.7* 29.6*  MCV 88.6  --  92.2 91.1  PLT 296  --  313 346   Cardiac Enzymes: No results for input(s): CKTOTAL, CKMB, CKMBINDEX, TROPONINI in the last 168 hours. BNP: Invalid input(s): POCBNP CBG: Recent Labs  Lab 12/13/23 0813 12/13/23 1210 12/13/23 1624 12/13/23 2023 12/14/23 0810   GLUCAP 135* 178* 157* 242* 134*   D-Dimer No results for input(s): DDIMER in the last 72 hours. Hgb A1c No results for input(s): HGBA1C in the last 72 hours. Lipid Profile No results for input(s): CHOL, HDL, LDLCALC, TRIG, CHOLHDL, LDLDIRECT in the last 72 hours. Thyroid  function studies Recent Labs    12/11/23 2230  TSH 1.390   Anemia work up Recent Labs    12/11/23 1818 12/11/23 1958 12/11/23 2000  VITAMINB12  --  1,498*  --   FOLATE  --   --  24.2  FERRITIN  --  23  --   TIBC  --  332  --   IRON  --  60  --   RETICCTPCT 2.3  --   --    Urinalysis    Component Value Date/Time   COLORURINE YELLOW 12/11/2023 1957   APPEARANCEUR CLEAR 12/11/2023 1957   LABSPEC 1.015 12/11/2023 1957   PHURINE 7.0 12/11/2023 1957   GLUCOSEU NEGATIVE 12/11/2023 1957   HGBUR NEGATIVE 12/11/2023 1957   BILIRUBINUR NEGATIVE 12/11/2023 1957   BILIRUBINUR n 02/19/2018 1032   KETONESUR NEGATIVE 12/11/2023 1957   PROTEINUR NEGATIVE 12/11/2023 1957   UROBILINOGEN 0.2 02/19/2018 1032   NITRITE NEGATIVE 12/11/2023 1957   LEUKOCYTESUR MODERATE (A) 12/11/2023 1957   Sepsis Labs Recent Labs  Lab 12/11/23 1818 12/13/23 1001 12/14/23 0659  WBC 8.7 8.1 8.0   Microbiology No results found for this or any previous visit (from the past 240 hours).   Time coordinating discharge: 35 minutes  SIGNED:   Renato Applebaum, MD  [facility] 12/14/2023, 9:16 AM

## 2023-12-14 NOTE — Anesthesia Postprocedure Evaluation (Signed)
 Anesthesia Post Note  Patient: [name]  Procedure(s) Performed: EGD (ESOPHAGOGASTRODUODENOSCOPY) BIOPSY, GI     Patient location during evaluation: PACU Anesthesia Type: MAC Level of consciousness: awake and alert Pain management: pain level controlled Vital Signs Assessment: post-procedure vital signs reviewed and stable Respiratory status: spontaneous breathing Cardiovascular status: stable Anesthetic complications: no   No notable events documented.  Last Vitals:  Vitals:   12/14/23 0455 12/14/23 0723  BP: 137/77 136/69  Pulse: 63 67  Resp:  16  Temp: 36.4 C 36.4 C  SpO2: 97% 98%    Last Pain:  Vitals:   12/14/23 0853  TempSrc:   PainSc: 0-No pain                 Norleen Pope

## 2023-12-16 LAB — SURGICAL PATHOLOGY

## 2023-12-17 ENCOUNTER — Ambulatory Visit
Admission: RE | Admit: 2023-12-17 | Discharge: 2023-12-17 | Disposition: A | Discharge status: Home / Self Care | Source: Ambulatory Visit

## 2023-12-17 DIAGNOSIS — Z1231 Encounter for screening mammogram for malignant neoplasm of breast: Secondary | ICD-10-CM

## 2023-12-18 ENCOUNTER — Emergency Department (HOSPITAL_COMMUNITY)
Admission: EM | Admit: 2023-12-18 | Discharge: 2023-12-18 | Disposition: A | Discharge status: Home / Self Care | Attending: Emergency Medicine | Admitting: Emergency Medicine

## 2023-12-18 ENCOUNTER — Encounter (HOSPITAL_COMMUNITY): Payer: Self-pay | Admitting: *Deleted

## 2023-12-18 ENCOUNTER — Emergency Department (HOSPITAL_COMMUNITY)

## 2023-12-18 ENCOUNTER — Ambulatory Visit: Payer: Self-pay | Admitting: Pediatrics

## 2023-12-18 ENCOUNTER — Other Ambulatory Visit: Payer: Self-pay

## 2023-12-18 DIAGNOSIS — K2900 Acute gastritis without bleeding: Secondary | ICD-10-CM | POA: Diagnosis not present

## 2023-12-18 DIAGNOSIS — R030 Elevated blood-pressure reading, without diagnosis of hypertension: Secondary | ICD-10-CM | POA: Insufficient documentation

## 2023-12-18 DIAGNOSIS — E871 Hypo-osmolality and hyponatremia: Secondary | ICD-10-CM | POA: Insufficient documentation

## 2023-12-18 DIAGNOSIS — R519 Headache, unspecified: Secondary | ICD-10-CM | POA: Diagnosis not present

## 2023-12-18 DIAGNOSIS — R5383 Other fatigue: Secondary | ICD-10-CM | POA: Diagnosis not present

## 2023-12-18 DIAGNOSIS — I10 Essential (primary) hypertension: Secondary | ICD-10-CM | POA: Diagnosis not present

## 2023-12-18 DIAGNOSIS — I161 Hypertensive emergency: Secondary | ICD-10-CM | POA: Diagnosis not present

## 2023-12-18 DIAGNOSIS — D649 Anemia, unspecified: Secondary | ICD-10-CM | POA: Diagnosis not present

## 2023-12-18 LAB — TROPONIN I (HIGH SENSITIVITY)
Troponin I (High Sensitivity): 5 ng/L (ref <18)
Troponin I (High Sensitivity): 8 ng/L (ref <18)

## 2023-12-18 LAB — CBC
HCT: 31.4 % — ABNORMAL LOW (ref 36.0–46.0)
Hemoglobin: 10.6 g/dL — ABNORMAL LOW (ref 12.0–15.0)
MCH: 30.6 pg (ref 26.0–34.0)
MCHC: 33.8 g/dL (ref 30.0–36.0)
MCV: 90.8 fL (ref 80.0–100.0)
Platelets: 401 K/uL — ABNORMAL HIGH (ref 150–400)
RBC: 3.46 MIL/uL — ABNORMAL LOW (ref 3.87–5.11)
RDW: 12.2 % (ref 11.5–15.5)
WBC: 8.5 K/uL (ref 4.0–10.5)
nRBC: 0 % (ref 0.0–0.2)

## 2023-12-18 LAB — COMPREHENSIVE METABOLIC PANEL WITH GFR
ALT: 23 U/L (ref 0–44)
AST: 23 U/L (ref 15–41)
Albumin: 3.6 g/dL (ref 3.5–5.0)
Alkaline Phosphatase: 96 U/L (ref 38–126)
Anion gap: 9 (ref 5–15)
BUN: 6 mg/dL (ref 6–20)
CO2: 25 mmol/L (ref 22–32)
Calcium: 9 mg/dL (ref 8.9–10.3)
Chloride: 93 mmol/L — ABNORMAL LOW (ref 98–111)
Creatinine, Ser: 0.5 mg/dL (ref 0.44–1.00)
GFR, Estimated: >60 mL/min (ref >60)
Glucose, Bld: 117 mg/dL — ABNORMAL HIGH (ref 70–99)
Potassium: 4.3 mmol/L (ref 3.5–5.1)
Sodium: 127 mmol/L — ABNORMAL LOW (ref 135–145)
Total Bilirubin: 0.4 mg/dL (ref 0.0–1.2)
Total Protein: 6.8 g/dL (ref 6.5–8.1)

## 2023-12-18 MED ORDER — CARVEDILOL 3.125 MG PO TABS
6.2500 mg | ORAL_TABLET | Freq: Two times a day (BID) | ORAL | Status: DC
  Administered 2023-12-18: 6.25 mg via ORAL
  Filled 2023-12-18: qty 2

## 2023-12-18 NOTE — ED Provider Triage Note (Signed)
 Emergency Medicine Provider Triage Evaluation Note  [name] , a 53 y.o. female  was evaluated in triage.  Pt complains of headache.  Gradual onset headache that became severe and has now started to improve started this morning.  Wraps around whole head.  No vision changes, slurred speech or focal deficit.  Was seen at primary care this morning had systolic in the 200s.  Does not endorse chest pain shortness of breath or nausea.  Took medications around 6 AM, lisinopril  and spironolactone .  Review of Systems  Positive: HTN, HA Negative: Cp  Physical Exam  BP (!) 159/77 (BP Location: Left Arm)   Pulse 77   Temp 98.8 F (37.1 C)   Resp 16   Ht 5' 9 (1.753 m)   Wt 132 kg   LMP 01/25/2008   SpO2 99%   BMI 42.97 kg/m  Gen:   Awake, no distress   Resp:  Normal effort  MSK:   Moves extremities without difficulty  Other:    Medical Decision Making  Medically screening exam initiated at 4:09 PM.  Appropriate orders placed.  [name] was informed that the remainder of the evaluation will be completed by another provider, this initial triage assessment does not replace that evaluation, and the importance of remaining in the ED until their evaluation is complete.  Nonfocal neurological exam   Shermon Warren SAILOR, PA-C 12/18/23 1611

## 2023-12-18 NOTE — Discharge Instructions (Signed)
 Return if symptoms change or worsen.  Otherwise, please follow-up with your care team as planned.

## 2023-12-18 NOTE — ED Provider Notes (Signed)
 [facility] Emergency Department Provider Note MRN:  [id number]  Arrival date & time: 12/18/23     Chief Complaint   Hypertension   History of Present Illness   [name] is a 53 y.o. year-old female presents to the ED with chief complaint of elevated blood pressure.  States that she went to PCP for hospital follow-up appointment.  States that her BP was 214/109.  States that it went down some after leaving her doctor's office.  [name]  History provided by patient.   Review of Systems  Pertinent positive and negative review of systems noted in HPI.    Physical Exam   Vitals:   12/18/23 1559 12/18/23 1916  BP: (!) 159/77 (!) 161/78  Pulse: 77 66  Resp: 16 16  Temp: 98.8 F (37.1 C)   SpO2: 99% 98%    CONSTITUTIONAL:  non toxic-appearing, NAD, high BMI NEURO:  Alert and oriented x 3, CN 3-12 grossly intact EYES:  eyes equal and reactive ENT/NECK:  Supple, no stridor  CARDIO:  normal rate, regular rhythm, appears well-perfused  PULM:  No respiratory distress, CTAB GI/GU:  non-distended, no focal abdominal tenderness MSK/SPINE:  No gross deformities, no edema, moves all extremities  SKIN:  no rash, atraumatic   *Additional and/or pertinent findings included in MDM below  Diagnostic and Interventional Summary    EKG Interpretation Date/Time:    Ventricular Rate:    PR Interval:    QRS Duration:    QT Interval:    QTC Calculation:   R Axis:      Text Interpretation:         Labs Reviewed  CBC - Abnormal; Notable for the following components:      Result Value   RBC 3.46 (*)    Hemoglobin 10.6 (*)    HCT 31.4 (*)    Platelets 401 (*)    All other components within normal limits  COMPREHENSIVE METABOLIC PANEL WITH GFR - Abnormal; Notable for the following components:   Sodium 127 (*)    Chloride 93 (*)    Glucose, Bld 117 (*)    All other components within normal limits  TROPONIN I (HIGH SENSITIVITY)  TROPONIN I (HIGH  SENSITIVITY)    CT Head Wo Contrast  Final Result      Medications  carvedilol  (COREG ) tablet 6.25 mg (has no administration in time range)     Procedures  /  Critical Care Procedures  ED Course and Medical Decision Making  I have reviewed the triage vital signs, the nursing notes, and pertinent available records from the EMR.  Social Determinants Affecting Complexity of Care: Patient has no clinically significant social determinants affecting this chief complaint..   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  Patient advised against compulsive water  drinking. [RB]  2219 CBC(!) No leukocytosis, hemoglobin improved to 10.6, up from 10.0. [RB]  2219 Troponin I (High Sensitivity) Troponins are flat at 5 and 8, no chest pain, doubt ACS. [RB]  2219 CT Head Wo Contrast CT head negative for obvious intracranial abnormality. [RB]    Clinical Course User Index [RB] Vicky Charleston, PA-C    Medical Decision Making Patient here after elevated blood pressure in the office for her outpatient hospital follow-up appointment.  Blood pressures were in the 200s over 100s, improved now.  Patient only complaining of nausea,  slight headache, and feeling hungry now.  Will give her some food since she has been waiting over 6 hours.  Will give first dose carvedilol , which was planned new prescription by PCP.  Labs are fairly reassuring and are about baseline.  Feel the patient can be safely discharged home.  Problems Addressed: Chronic hyponatremia: chronic illness or injury    Details: Recently admitted in the hospital this this chronic hyponatremia was thought 2/2 SIADH and patient was counseled against compulsive water  drinking.  I reiterated this plan. Elevated blood pressure reading: acute illness or injury    Details: BP was high  in the office at her follow-up appointment today.  She had BP of 214/109.  States that she still feels bad from recent hospital admission (with fatigue).  States that she now feels hungry and a little nauseous with slight headache.  CT head negative.  Will give dose of carvedilol , which her PCP started today, but she hasn't taken any yet.  BPs are improved now compared to the office earlier.  Labs are about baseline.  I don't think she needs additional monitoring or workup tonight.  Amount and/or Complexity of Data Reviewed Labs:  Decision-making details documented in ED Course. Radiology: independent interpretation performed. Decision-making details documented in ED Course. ECG/medicine tests: ordered.  Risk Prescription drug management.         Consultants: No consultations were needed in caring for this patient.   Treatment and Plan: I considered admission due to patient's initial presentation, but after considering the examination and diagnostic results, patient will not require admission and can be discharged with outpatient follow-up.    Final Clinical Impressions(s) / ED Diagnoses     ICD-10-CM   1. Elevated blood pressure reading  R03.0     2. Chronic hyponatremia  E87.1       ED Discharge Orders     None         Discharge Instructions Discussed with and Provided to Patient:   Discharge Instructions   None      Vicky Charleston, PA-C 12/18/23 2322    Carita Senior, MD 12/19/23 [phone]

## 2023-12-18 NOTE — ED Triage Notes (Signed)
 Pt to ED via POV from PCP C/O HTN. Reports concern for high blood pressure in office. Reports hx of HTN, takes meds as prescribed.

## 2023-12-19 ENCOUNTER — Other Ambulatory Visit (HOSPITAL_BASED_OUTPATIENT_CLINIC_OR_DEPARTMENT_OTHER): Payer: Self-pay

## 2023-12-19 MED ORDER — CARVEDILOL 6.25 MG PO TABS
6.2500 mg | ORAL_TABLET | Freq: Two times a day (BID) | ORAL | 1 refills | Status: DC
  Filled 2023-12-19: qty 60, 30d supply, fill #0

## 2023-12-22 ENCOUNTER — Other Ambulatory Visit (HOSPITAL_BASED_OUTPATIENT_CLINIC_OR_DEPARTMENT_OTHER): Payer: Self-pay

## 2023-12-23 ENCOUNTER — Other Ambulatory Visit: Payer: Self-pay

## 2023-12-23 ENCOUNTER — Other Ambulatory Visit (HOSPITAL_BASED_OUTPATIENT_CLINIC_OR_DEPARTMENT_OTHER): Payer: Self-pay

## 2023-12-30 NOTE — Progress Notes (Unsigned)
 Cardiology Clinic Note   Patient Name: [name] Date of Encounter: 12/31/2023  Primary Care Provider:  Seabron Lenis, MD Primary Cardiologist:  Darryle ONEIDA Decent, MD  Patient Profile    [name] 53 year old female presents to the clinic today for follow-up evaluation of her hypertension and palpitations.  Past Medical History    Past Medical History:  Diagnosis Date   Allergy    seasonal allergies   Anemia    hx of   Anxiety    on meds   Asthma    hx of   Bipolar disorder (HCC)    Cataract    bilateral sx   Chicken pox    Complication of anesthesia    oxygen saturation dropped after hysterectomy 2009 at [facility]   Depression    on meds   Diabetes mellitus    on meds   Dyspnea    GERD (gastroesophageal reflux disease)    hx of   Hyperlipidemia    on meds   Hypertension    readings   Hypothyroidism    hx of-LEFT thyroid  removed   Irregular heartbeat    saw dr Micky sees -cardiology as needed   Migraine    Sleep apnea    uses cpap   Past Surgical History:  Procedure Laterality Date   ANKLE SURGERY Left 1989   BACK SURGERY  1999   BONE BIOPSY  12/13/2023   Procedure: BIOPSY, GI;  Surgeon: Suzann Inocente HERO, MD;  Location: [facility] ENDOSCOPY;  Service: Gastroenterology;;   CATARACT EXTRACTION, BILATERAL     CESAREAN SECTION     1995   ESOPHAGOGASTRODUODENOSCOPY N/A 12/13/2023   Procedure: EGD (ESOPHAGOGASTRODUODENOSCOPY);  Surgeon: Suzann Inocente HERO, MD;  Location: [facility] ENDOSCOPY;  Service: Gastroenterology;  Laterality: N/A;   LAPAROSCOPIC ASSISTED VAGINAL HYSTERECTOMY  2009     BSO fibroids, DUB, pelvic pain   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 REPAIR N/A 04/14/2016   Procedure: LAPAROSCOPIC ROUX-EN-Y GASTRIC BYPASS  WITH UPPER ENDOSCOPY;  Surgeon: Morene Olives, MD;  Location: [facility] ORS;  Service: General;  Laterality: N/A;   SHOULDER ARTHROSCOPY WITH OPEN ROTATOR CUFF REPAIR Right 01/02/2017   Procedure: Right shoulder  arthroscopy, A-subcromial decompression, mini open rotator cuff repair, open distal clavicle resection, biceps tenodesis;  Surgeon: Kay Kemps, MD;  Location: [facility] OR;  Service: Orthopedics;  Laterality: Right;   THYROIDECTOMY, PARTIAL Left 2001   UMBILICAL HERNIA REPAIR  2003   WISDOM TOOTH EXTRACTION      Allergies  Allergies  Allergen Reactions   Zofran  [Ondansetron ] Other (See Comments)    Severe headaches     History of Present Illness    [name] has a PMH of diabetes, HTN, hyperlipidemia, and OSA.  She was seen in follow-up by Dr. Decent on 12/02/2022.  During that time she reported that her blood pressure was ranging in the 130-140 systolic range at home.  It was slightly elevated during her appointment.  She reported that she had rushed to her appointment that day.  She denied chest pain and shortness of breath.  She was starting to exercise.  She was walking 1.25 miles every other day.  She was doing this without limitations.  Her cholesterol was close to goal.  Overall she was stable from a cardiac standpoint.  Follow-up was planned for 1 year.  She was seen and evaluated in the emergency department on 12/12/2023.  She underwent EGD on 12/13/2023.  She had reported worsening weakness and  fatigue for about 2 weeks.  She noted that she had been taking her aspirin daily and Aleve  every day.  In the emergency department she was hemodynamically stable.  Her hemoglobin was noted to be 9.6.  Platelets were 296.  Her occult stool was positive.  GI was consulted.  She was diagnosed with NSAID induced gastritis.  Her EGD showed no evidence of active bleed.  She is discharged home on Pepcid .  She was instructed to avoid NSAIDs.  Her iron panels were normal.  Her blood pressure was stable on lisinopril  and spironolactone .  She presents to the clinic today for follow-up evaluation states she is feeling better since she was in the hospital.  We reviewed her hospitalization and EGD.  She  expressed understanding.  She continues to be on iron supplementation.  She followed up with her PCP who recommended consult with hematology and [facility].  She has an appointment with hematology on 01/26/2024.  She notes that she is still somewhat fatigued but generally feeling better.  She has been monitoring her blood pressure and notices systolic blood pressures in the 120-130 range.  Today in clinic her blood pressure was noted to be 140/74 and on recheck was 132/70.  I will have her continue her current medication regimen and plan follow-up in 6 months..  She denies chest pain, shortness of breath, lower extremity edema, fatigue, palpitations, melena, hematuria, hemoptysis, diaphoresis, weakness, presyncope, syncope, orthopnea, and PND.     Home Medications    Prior to Admission medications   Medication Sig Start Date End Date Taking? Authorizing Provider  acetaminophen  (TYLENOL ) 500 MG tablet Take 500-1,000 mg by mouth every 6 (six) hours as needed for moderate pain (pain score 4-6).    [provider]  atorvastatin  (LIPITOR) 40 MG tablet Take 1 tablet (40 mg total) by mouth daily. 09/14/23   Tabori, Katherine E, MD  Biotin 89999 MCG TABS Take 1 tablet by mouth daily.    [provider]  carbamazepine  (TEGRETOL -XR) 200 MG 12 hr tablet Take 1 tablet (200 mg total) by mouth in the morning AND 2 tablets (400 mg total) at bedtime. Patient not taking: Reported on 12/12/2023 04/29/23     carvedilol  (COREG ) 6.25 MG tablet Take 1 tablet (6.25 mg total) by mouth 2 (two) times daily with food. 12/19/23     Continuous Glucose Sensor (DEXCOM G7 SENSOR) MISC Apply 1 sensor every 10 days as directed. 12/08/23   Trixie File, MD  cyclobenzaprine  (FLEXERIL ) 10 MG tablet Take 1 tablet (10 mg total) by mouth at bedtime as needed. Patient taking differently: Take 10 mg by mouth at bedtime. 07/21/23     Dulaglutide  (TRULICITY ) 3 MG/0.5ML SOAJ Inject 3 mg into the skin once a week. 12/08/23    Trixie File, MD  famotidine  (PEPCID ) 20 MG tablet Take 1 tablet (20 mg total) by mouth 2 (two) times daily. 12/14/23 12/13/24  Raenelle Coria, MD  gabapentin  (NEURONTIN ) 800 MG tablet Take 1 tablet (800 mg total) by mouth 3 (three) times daily. 10/08/23   Skeet Juliene SAUNDERS, DO  Glucosamine HCl (GLUCOSAMINE PO) Take 3 tablets by mouth daily.    [provider]  glucose blood (FREESTYLE LITE) test strip Use to check blood sugar 2 times daily 09/25/22   Trixie File, MD  lamoTRIgine  (LAMICTAL ) 200 MG tablet Take 1.5 tablets (300 mg total) by mouth at bedtime. 10/21/23     lisinopril  (ZESTRIL ) 40 MG tablet Take 1 tablet (40 mg total) by mouth daily.  Patient taking differently: Take 40 mg by mouth at bedtime. 11/26/23   O'NealDarryle Ned, MD  Loratadine (CLARITIN PO) Take 1 tablet by mouth at bedtime.    [provider]  magnesium  oxide (MAG-OX) 400 MG tablet Take 1 tablet (400 mg total) by mouth daily. 12/14/23 03/13/24  Raenelle Coria, MD  metFORMIN  (GLUCOPHAGE ) 1000 MG tablet Take 1 tablet (1,000 mg total) by mouth daily with supper. 12/08/23 12/07/24  Trixie File, MD  Multiple Vitamin (MULTIVITAMIN) tablet Take 1 tablet by mouth daily.    [provider]  spironolactone  (ALDACTONE ) 25 MG tablet Take 1 tablet (25 mg total) by mouth daily. 08/17/23   Barbaraann Darryle Ned, MD  TEGRETOL -XR 200 MG 12 hr tablet Take 1 tablet (200 mg total) by mouth in the morning AND 2 tablets (400 mg total) at bedtime. Patient taking differently: Take 1 tablet (200 mg total) by mouth at lunchtime AND 2 tablets (400 mg total) at bedtime. 10/21/23     tiaGABine  (GABITRIL ) 4 MG tablet Take 1 tablet (4 mg total) by mouth in the morning and at bedtime. 08/19/23     Turmeric (QC TUMERIC COMPLEX PO) Take 1 Dose by mouth daily.    [provider]    Family History    Family History  Problem Relation Age of Onset   Hyperlipidemia Mother    Diabetes Mother    Anxiety disorder Mother     Heart disease Mother    Hypertension Father    Lupus Father    Heart disease Father    Asthma Father    Colon polyps Father 25   Stroke Maternal Aunt    Seizures Paternal Grandfather    Stroke Paternal Grandfather    Mental illness Paternal Grandfather    Breast cancer Neg Hx    Colon cancer Neg Hx    Esophageal cancer Neg Hx    Stomach cancer Neg Hx    Rectal cancer Neg Hx    She indicated that her mother is alive. She indicated that her father is alive. She indicated that her sister is alive. She indicated that her brother is alive. She indicated that her paternal grandfather is deceased. She indicated that the status of her maternal aunt is unknown. She indicated that the status of her neg hx is unknown.  Social History    Social History   Socioeconomic History   Marital status: Married    Spouse name: Not on file   Number of children: 1   Years of education: Not on file   Highest education level: Associate degree: academic program  Occupational History   Occupation: nursing  Tobacco Use   Smoking status: Never   Smokeless tobacco: Never  Vaping Use   Vaping status: Never Used  Substance and Sexual Activity   Alcohol use: No    Alcohol/week: 0.0 standard drinks of alcohol   Drug use: No   Sexual activity: Yes    Partners: Male  Other Topics Concern   Not on file  Social History Narrative   Not on file   Social Drivers of Health   Financial Resource Strain: Low Risk  (12/24/2022)   Overall Financial Resource Strain (CARDIA)    Difficulty of Paying Living Expenses: Not hard at all  Food Insecurity: No Food Insecurity (12/12/2023)   Hunger Vital Sign    Worried About Running Out of Food in the Last Year: Never true    Ran Out of Food in the Last Year: Never true  Transportation Needs: No Transportation Needs (12/12/2023)   PRAPARE - Administrator, Civil Service (Medical): No    Lack of Transportation (Non-Medical): No  Physical Activity: Unknown  (12/24/2022)   Exercise Vital Sign    Days of Exercise per Week: 0 days    Minutes of Exercise per Session: Not on file  Stress: No Stress Concern Present (12/24/2022)   [employer] of Occupational Health - Occupational Stress Questionnaire    Feeling of Stress : Not at all  Social Connections: Moderately Isolated (12/24/2022)   Social Connection and Isolation Panel    Frequency of Communication with Friends and Family: Twice a week    Frequency of Social Gatherings with Friends and Family: Once a week    Attends Religious Services: Never    Database administrator or Organizations: No    Attends Engineer, structural: Not on file    Marital Status: Married  Catering manager Violence: Not At Risk (12/12/2023)   Humiliation, Afraid, Rape, and Kick questionnaire    Fear of Current or Ex-Partner: No    Emotionally Abused: No    Physically Abused: No    Sexually Abused: No     Review of Systems    General:  No chills, fever, night sweats or weight changes.  Cardiovascular:  No chest pain, dyspnea on exertion, edema, orthopnea, palpitations, paroxysmal nocturnal dyspnea. Dermatological: No rash, lesions/masses Respiratory: No cough, dyspnea Urologic: No hematuria, dysuria Abdominal:   No nausea, vomiting, diarrhea, bright red blood per rectum, melena, or hematemesis Neurologic:  No visual changes, wkns, changes in mental status. All other systems reviewed and are otherwise negative except as noted above.  Physical Exam    VS:  BP 132/70   Pulse 67   Ht 5' 9 (1.753 m)   Wt 288 lb (130.6 kg)   LMP 01/25/2008   SpO2 99%   BMI 42.53 kg/m  , BMI Body mass index is 42.53 kg/m. GEN: Well nourished, well developed, in no acute distress. HEENT: normal. Neck: Supple, no JVD, carotid bruits, or masses. Cardiac: RRR, no murmurs, rubs, or gallops. No clubbing, cyanosis, edema.  Radials/DP/PT 2+ and equal bilaterally.  Respiratory:  Respirations regular and unlabored, clear to  auscultation bilaterally. GI: Soft, nontender, nondistended, BS + x 4. MS: no deformity or atrophy. Skin: warm and dry, no rash. Neuro:  Strength and sensation are intact. Psych: Normal affect.  Accessory Clinical Findings    Recent Labs: 12/11/2023: TSH 1.390 12/14/2023: Magnesium  1.4 12/18/2023: ALT 23; BUN 6; Creatinine, Ser 0.50; Hemoglobin 10.6; Platelets 401; Potassium 4.3; Sodium 127   Recent Lipid Panel    Component Value Date/Time   CHOL 172 03/05/2023 0902   TRIG 127.0 03/05/2023 0902   HDL 58.60 03/05/2023 0902   CHOLHDL 3 03/05/2023 0902   VLDL 25.4 03/05/2023 0902   LDLCALC 88 03/05/2023 0902   LDLCALC 79 08/29/2021 1437   LDLDIRECT 112.0 06/21/2014 1542         ECG personally reviewed by me today- None today.    Cardiac event monitor 02/08/2020   Enrollment 01/22/2020-01/29/2020 (7 days 3 hours). Patient had a min HR of 53 bpm (sinus bradycardia), max HR of 156 bpm (supraventricular tachycardia, 4 beats, 1.6 duration), and avg HR of 76 bpm (normal sinus rhythm). Predominant underlying rhythm was Sinus Rhythm. 3 Supraventricular Tachycardia runs occurred, the run with the fastest interval lasting 4 beats (1.6 second duration) with a max rate of 156 bpm, the longest lasting 6  beats (2.6 second duration) with an avg rate of 140 bpm. SVT episodes appear to be brief ectopic atrial tachycardia events. Isolated SVEs were rare (<1.0%), SVE Couplets were rare (<1.0%), and SVE Triplets were rare (<1.0%). Isolated VEs were rare (<1.0%), and no VE Couplets or VE Triplets were present. No atrial fibrillation. Diary summarized below:   01/22/20 06:54pm short of breath, fluttering/racing, pounding, chest pain/pressure coincided with normal sinus rhythm 83 bpm.  01/24/20 04:00pm fluttering/racing, pounding coincided with normal sinus rhythm 71 bpm.  01/24/20 04:05pm fluttering/racing, pounding coincided with normal sinus rhythm 69 bpm.  01/25/20 02:06pm short of breath,  fluttering/racing, pounding coincided with normal sinus rhythm 80 bpm.  01/25/20 02:09pm short of breath, fluttering/racing, pounding coincided with normal sinus rhythm 80 bpm.  01/26/20 04:25pm short of breath, fluttering/racing, pounding coincided with normal sinus rhythm 71 bpm.  01/26/20 05:32pm short of breath, fluttering/racing, pounding coincided with normal sinus rhythm 70 bpm.  01/27/20 03:27pm short of breath, fluttering/racing, pounding coincided with normal sinus rhythm 65 bpm.    Impression:    1. Brief ectopic atrial tachycardia episodes detected (3 episodes in 7 days; longest duration 2.6 seconds).  2. No atrial fibrillation.  3. Rare ectopy.      Assessment & Plan   1.  Essential hypertension-BP today 132/70. Maintain blood pressure log Heart healthy  diet Continue lisinopril , spironolactone   Lightheadedness, increased heart rate-contacted nurse triage line on 12/10/2023.  She reported feeling lightheaded and heart rate going up to 100.  Previous cardiac event monitor showed no arrhythmia and atrial tachycardia.   Maintain p.o. hydration Change positions slowly Slightly increase sodium in diet-follow-up with nephrology Lower extremity support stockings-stocking sheet  Hyperlipidemia-LDL 88 03/05/23. High-fiber diet Continue atorvastatin , aspirin Increase physical activity as tolerated  History of gastritis-denies bleeding issues.  No dark tarry stools or frank red blood.  Seen evaluated in the emergency department 7/25.  Underwent EGD which showed no active bleed and confirmed gastritis.  She had been using Aleve  daily.  She is following up with hematology due to low hemoglobin Continue iron supplementation-she is using vitamin C with this. Continue to avoid NSAIDs Continue famotidine   Type 2 diabetes-glucose 7.2 on 12/08/23. Follows with PCP Continue metformin , Trulicity  Carb modified diet  Disposition: Follow-up with Dr. Barbaraann or me in 4-6  months.   Josefa HERO. Takoda Janowiak NP-C     12/31/2023, 2:05 PM [facility] [address] Office [phone] Fax [phone]    I spent 14 minutes examining this patient, reviewing medications, and using patient centered shared decision making involving their cardiac care.   I spent  20 minutes reviewing past medical history,  medications, and prior cardiac tests.

## 2023-12-31 ENCOUNTER — Other Ambulatory Visit (HOSPITAL_BASED_OUTPATIENT_CLINIC_OR_DEPARTMENT_OTHER): Payer: Self-pay

## 2023-12-31 ENCOUNTER — Ambulatory Visit: Attending: General Practice | Admitting: General Practice

## 2023-12-31 ENCOUNTER — Encounter: Payer: Self-pay | Admitting: General Practice

## 2023-12-31 VITALS — BP 132/70 | HR 67 | Ht 69.0 in | Wt 288.0 lb

## 2023-12-31 DIAGNOSIS — R42 Dizziness and giddiness: Secondary | ICD-10-CM | POA: Diagnosis not present

## 2023-12-31 DIAGNOSIS — K2950 Unspecified chronic gastritis without bleeding: Secondary | ICD-10-CM

## 2023-12-31 DIAGNOSIS — I10 Essential (primary) hypertension: Secondary | ICD-10-CM | POA: Diagnosis not present

## 2023-12-31 DIAGNOSIS — R002 Palpitations: Secondary | ICD-10-CM | POA: Diagnosis not present

## 2023-12-31 DIAGNOSIS — E785 Hyperlipidemia, unspecified: Secondary | ICD-10-CM

## 2023-12-31 MED ORDER — CARVEDILOL 6.25 MG PO TABS
6.2500 mg | ORAL_TABLET | Freq: Two times a day (BID) | ORAL | 3 refills | Status: AC
  Filled 2023-12-31 – 2024-01-12 (×2): qty 180, 90d supply, fill #0
  Filled 2024-04-11: qty 180, 90d supply, fill #1

## 2023-12-31 NOTE — Patient Instructions (Signed)
Medication Instructions:  Your physician recommends that you continue on your current medications as directed. Please refer to the Current Medication list given to you today.  *If you need a refill on your cardiac medications before your next appointment, please call your pharmacy*  Lab Work: NONE  If you have labs (blood work) drawn today and your tests are completely normal, you will receive your results only by: MyChart Message (if you have MyChart) OR A paper copy in the mail If you have any lab test that is abnormal or we need to change your treatment, we will call you to review the results.  Testing/Procedures: NONE  Follow-Up: At [facility], you and your health needs are our priority.  As part of our continuing mission to provide you with exceptional heart care, our providers are all part of one team.  This team includes your primary Cardiologist (physician) and Advanced Practice Providers or APPs (Physician Assistants and Nurse Practitioners) who all work together to provide you with the care you need, when you need it.  Your next appointment:   6 month(s)  Provider:   Darryle ONEIDA Decent, MD  We recommend signing up for the patient portal called MyChart.  Sign up information is provided on this After Visit Summary.  MyChart is used to connect with patients for Virtual Visits (Telemedicine).  Patients are able to view lab/test results, encounter notes, upcoming appointments, etc.  Non-urgent messages can be sent to your provider as well.   To learn more about what you can do with MyChart, go to [email or web address].    [employer]  Youth worker for [employer] Mailing Address:  [address];   [address]  [place], [place] [address]  Tel [phone] Fx [phone]     High Quality Legwear for Today's Active Lifestyles Maximum Compression at the ankle. Compression lessens gradually up the leg.   We manufacture a wide  range of compression hosiery for men and women in  different styles, constructions and levels of support.  How Compression Hosiery Works Regulatory affairs officer, [employer]. compression hosiery works by applying graduated pressure to the  muscles and veins in the legs.  When the calf muscle contracts such as during walking  the compression hosiery will "give" and then return to its original position. By doing so  the hosiery is assists your body's circulatory wellness.  The result is increased leg health and vitality.   Maximum Compression at the ankle Compression lessens gradually up the leg  We Offer: Sheer & Opaque Stockings       COLORS:  Nude, black, white and misc. prints Below Knee Thigh High Pantyhose  High Quality Legwear for Today's Active Lifestyles We manufacture a wide range of compression hosiery for men and women in different styles, construction sand levels of support.  Socks:                     Sheer & Opaque   Compression Levels Include:                                  Stockings Men's               Below Knee                8-15 mmHg   Women's         Thigh  High                 15-20 mmHg  Unisex             Pantyhose                 20-30 mmHg                                                                      30-40 mmHg  4 Simple Ways to Order   Email  [email or web address] Mail/Email orders are subject to processing and handling charges. Allow 7-10 days for receipt.  Phone [phone]  Please allow 24 hours for return call.   In Person  We recommend calling prior to your visit to confirm store hours as they may change due to holiday, weather, and maintenance.   By Mail When placing an order, please have the following information available. Our representatives are available to assist.     Measurements    THIGH      in.   CALF        in.   ANKLE     in.    Compression  8-15 mmHg 15-20 mmHg**   20-30 mmHg 30-40 mmHg   WOMEN'S MEN'S  Shoe Size Sock Size Shoe  Size Sock Size  4 - 5 Small 7.5 and Under Small  5.5 - 7.5 Medium 8 - 10 Medium  8 - 10 Large 10.5 - 12 Large  10.5 and Over X-Large 12.5 and Over X-Large   Knee High Size Chart  Length from CALF MEASUREMENT  floor to bend   in knee. 11 12 13 14 15 16 17 18 19 20  21" 22"  14 S S S S M M L L L XL XL XL  15 S S S M M L L L XL XL XXL XXL  16 S S M M M L L L XL XXL XXL XXL  17 S M M M M L L XL XL XXL XXL XXL  18 M M M M L L L XL XL XXL XXL XXL  19 M M M M L L XL XL XL XXL XXL XXL   Thigh High Circumference Sizing Chart                 S M L XL XXL  ANKLE 6.5 - 8 8 - 9.5 9.5 - 11 11 - 12.5 12.5 - 14  CALF 10.5 - 14.5 11.5 - 15.5 12.5 - 17 13.5 - 17.5 14.5 - 19.5  THIGH 15.5 - 22 17.5 - 24 19.5 - 26 22 - 28 26 - 32  HIP UP TO 40 UP TO 44 UP TO 48' UP TO 52 UP TO 56   Pantyhose Size Chart  Height Petite Medium Tall X-Tall Queen Queen +   Weight Weight Weight Weight Weight Weight  4'11 95-130 135      5'0 95-125 130-145   170-185   5'1 90-120 125-155 160-165  170-195   5'2 90-115 120-145 150-165  170-195   5'3 90-110 115-140 145-165  170-200 200-225  5'4 100-105 110-135 140-160 165 170-200 200-225  5'5 100 105-130 135-160 165 170-200 200-225  5'6  110-125 130-155  160-165 170-200 200-225  5'7  110-120 125-150 155-165 170-200 195-225  5'8   120-145 150-165 170-200 190-225  5'9   125-140 145-170 175-190 185-220  5'10   125-135 140-185  185-215  5'11   130-135 140-185  190-210      

## 2024-01-01 ENCOUNTER — Other Ambulatory Visit (HOSPITAL_BASED_OUTPATIENT_CLINIC_OR_DEPARTMENT_OTHER): Payer: Self-pay

## 2024-01-01 MED ORDER — LISINOPRIL 40 MG PO TABS
40.0000 mg | ORAL_TABLET | Freq: Every day | ORAL | 3 refills | Status: DC
  Filled 2024-01-01: qty 90, 90d supply, fill #0

## 2024-01-06 ENCOUNTER — Other Ambulatory Visit (HOSPITAL_BASED_OUTPATIENT_CLINIC_OR_DEPARTMENT_OTHER): Payer: Self-pay

## 2024-01-07 DIAGNOSIS — E871 Hypo-osmolality and hyponatremia: Secondary | ICD-10-CM | POA: Diagnosis not present

## 2024-01-07 DIAGNOSIS — E119 Type 2 diabetes mellitus without complications: Secondary | ICD-10-CM | POA: Diagnosis not present

## 2024-01-07 DIAGNOSIS — I10 Essential (primary) hypertension: Secondary | ICD-10-CM | POA: Diagnosis not present

## 2024-01-07 DIAGNOSIS — G4730 Sleep apnea, unspecified: Secondary | ICD-10-CM | POA: Diagnosis not present

## 2024-01-08 ENCOUNTER — Other Ambulatory Visit (HOSPITAL_BASED_OUTPATIENT_CLINIC_OR_DEPARTMENT_OTHER): Payer: Self-pay

## 2024-01-11 ENCOUNTER — Encounter (HOSPITAL_BASED_OUTPATIENT_CLINIC_OR_DEPARTMENT_OTHER): Payer: Self-pay

## 2024-01-11 ENCOUNTER — Other Ambulatory Visit (HOSPITAL_BASED_OUTPATIENT_CLINIC_OR_DEPARTMENT_OTHER): Payer: Self-pay

## 2024-01-11 ENCOUNTER — Other Ambulatory Visit: Payer: Self-pay

## 2024-01-11 MED ORDER — URE-NA 15 G PO PACK
15.0000 g | PACK | Freq: Two times a day (BID) | ORAL | 3 refills | Status: AC
  Filled 2024-01-11: qty 64, 32d supply, fill #0
  Filled 2024-02-08: qty 64, 32d supply, fill #1
  Filled 2024-03-11: qty 64, 32d supply, fill #2

## 2024-01-12 ENCOUNTER — Other Ambulatory Visit: Payer: Self-pay

## 2024-01-12 ENCOUNTER — Other Ambulatory Visit (HOSPITAL_BASED_OUTPATIENT_CLINIC_OR_DEPARTMENT_OTHER): Payer: Self-pay

## 2024-01-14 ENCOUNTER — Other Ambulatory Visit: Payer: Self-pay

## 2024-01-14 ENCOUNTER — Emergency Department (HOSPITAL_COMMUNITY)
Admission: EM | Admit: 2024-01-14 | Discharge: 2024-01-14 | Disposition: A | Discharge status: Home / Self Care | Attending: Emergency Medicine | Admitting: Emergency Medicine

## 2024-01-14 ENCOUNTER — Encounter (HOSPITAL_COMMUNITY): Payer: Self-pay

## 2024-01-14 ENCOUNTER — Encounter: Payer: Self-pay | Admitting: Family Medicine

## 2024-01-14 DIAGNOSIS — J45909 Unspecified asthma, uncomplicated: Secondary | ICD-10-CM | POA: Diagnosis not present

## 2024-01-14 DIAGNOSIS — I10 Essential (primary) hypertension: Secondary | ICD-10-CM | POA: Insufficient documentation

## 2024-01-14 DIAGNOSIS — D649 Anemia, unspecified: Secondary | ICD-10-CM | POA: Insufficient documentation

## 2024-01-14 DIAGNOSIS — E039 Hypothyroidism, unspecified: Secondary | ICD-10-CM | POA: Insufficient documentation

## 2024-01-14 DIAGNOSIS — R1084 Generalized abdominal pain: Secondary | ICD-10-CM | POA: Diagnosis not present

## 2024-01-14 DIAGNOSIS — E119 Type 2 diabetes mellitus without complications: Secondary | ICD-10-CM | POA: Insufficient documentation

## 2024-01-14 DIAGNOSIS — R55 Syncope and collapse: Secondary | ICD-10-CM | POA: Diagnosis not present

## 2024-01-14 DIAGNOSIS — R531 Weakness: Secondary | ICD-10-CM | POA: Diagnosis not present

## 2024-01-14 DIAGNOSIS — R231 Pallor: Secondary | ICD-10-CM | POA: Diagnosis not present

## 2024-01-14 LAB — CBC WITH DIFFERENTIAL/PLATELET
Abs Immature Granulocytes: 0.04 K/uL (ref 0.00–0.07)
Basophils Absolute: 0 K/uL (ref 0.0–0.1)
Basophils Relative: 0 %
Eosinophils Absolute: 0.3 K/uL (ref 0.0–0.5)
Eosinophils Relative: 3 %
HCT: 34.3 % — ABNORMAL LOW (ref 36.0–46.0)
Hemoglobin: 11 g/dL — ABNORMAL LOW (ref 12.0–15.0)
Immature Granulocytes: 0 %
Lymphocytes Relative: 13 %
Lymphs Abs: 1.4 K/uL (ref 0.7–4.0)
MCH: 29.8 pg (ref 26.0–34.0)
MCHC: 32.1 g/dL (ref 30.0–36.0)
MCV: 93 fL (ref 80.0–100.0)
Monocytes Absolute: 0.6 K/uL (ref 0.1–1.0)
Monocytes Relative: 6 %
Neutro Abs: 8.1 K/uL — ABNORMAL HIGH (ref 1.7–7.7)
Neutrophils Relative %: 78 %
Platelets: 306 K/uL (ref 150–400)
RBC: 3.69 MIL/uL — ABNORMAL LOW (ref 3.87–5.11)
RDW: 11.9 % (ref 11.5–15.5)
WBC: 10.4 K/uL (ref 4.0–10.5)
nRBC: 0 % (ref 0.0–0.2)

## 2024-01-14 LAB — URINALYSIS, W/ REFLEX TO CULTURE (INFECTION SUSPECTED)
Bilirubin Urine: NEGATIVE
Glucose, UA: NEGATIVE mg/dL
Hgb urine dipstick: NEGATIVE
Ketones, ur: NEGATIVE mg/dL
Nitrite: NEGATIVE
Protein, ur: NEGATIVE mg/dL
Specific Gravity, Urine: 1.013 (ref 1.005–1.030)
pH: 6 (ref 5.0–8.0)

## 2024-01-14 LAB — BASIC METABOLIC PANEL WITH GFR
Anion gap: 18 — ABNORMAL HIGH (ref 5–15)
BUN: 35 mg/dL — ABNORMAL HIGH (ref 6–20)
CO2: 18 mmol/L — ABNORMAL LOW (ref 22–32)
Calcium: 9.2 mg/dL (ref 8.9–10.3)
Chloride: 101 mmol/L (ref 98–111)
Creatinine, Ser: 0.59 mg/dL (ref 0.44–1.00)
GFR, Estimated: >60 mL/min (ref >60)
Glucose, Bld: 135 mg/dL — ABNORMAL HIGH (ref 70–99)
Potassium: 4.9 mmol/L (ref 3.5–5.1)
Sodium: 137 mmol/L (ref 135–145)

## 2024-01-14 LAB — TROPONIN I (HIGH SENSITIVITY): Troponin I (High Sensitivity): 5 ng/L (ref <18)

## 2024-01-14 MED ORDER — SODIUM CHLORIDE 0.9 % IV BOLUS
500.0000 mL | Freq: Once | INTRAVENOUS | Status: AC
  Administered 2024-01-14: 500 mL via INTRAVENOUS

## 2024-01-14 MED ORDER — SODIUM CHLORIDE 0.9 % IV BOLUS: 500.0000 mL | Freq: Once | INTRAVENOUS | Status: DC

## 2024-01-14 NOTE — Discharge Instructions (Addendum)

## 2024-01-14 NOTE — ED Notes (Signed)
CBC recollected and sent to lab 

## 2024-01-14 NOTE — ED Provider Notes (Signed)
 Emergency Department Provider Note   I have reviewed the triage vital signs and the nursing notes.   HISTORY  Chief Complaint Loss of Consciousness   HPI [name] is a 53 y.o. female with past history reviewed below presents to the emergency department for evaluation of syncope.  Patient was at work and felt lightheaded with standing.  She sat down felt somewhat better.  She then stood up to try and make it to an exam room where she could be further evaluated and had an approximately 45-second episode of unresponsiveness.  She did not have any chest pain or palpitations.  No fevers or chills.  She has not noticed any recent GI bleeding.   Past Medical History:  Diagnosis Date   Allergy    seasonal allergies   Anemia    hx of   Anxiety    on meds   Asthma    hx of   Bipolar disorder (HCC)    Cataract    bilateral sx   Chicken pox    Complication of anesthesia    oxygen saturation dropped after hysterectomy 2009 at [facility]   Depression    on meds   Diabetes mellitus    on meds   Dyspnea    GERD (gastroesophageal reflux disease)    hx of   Hyperlipidemia    on meds   Hypertension    readings   Hypothyroidism    hx of-LEFT thyroid  removed   Irregular heartbeat    saw dr Micky sees -cardiology as needed   Migraine    Sleep apnea    uses cpap    Review of Systems  Constitutional: No fever/chills Cardiovascular: Denies chest pain. Positive syncope.  Respiratory: Denies shortness of breath. Gastrointestinal: No abdominal pain.  No nausea, no vomiting.  No diarrhea.   Skin: Negative for rash. Neurological: Negative for headaches. ____________________________________________   PHYSICAL EXAM:  VITAL SIGNS: ED Triage Vitals [01/14/24 1207]  Encounter Vitals Group     BP (!) 149/76     Pulse Rate 67     Resp 16     Temp 97.6 F (36.4 C)     Temp Source Oral     SpO2 100 %   Constitutional: Alert and oriented. Well appearing and in no acute  distress. Eyes: Conjunctivae are normal.  Head: Atraumatic. Nose: No congestion/rhinnorhea. Mouth/Throat: Mucous membranes are moist. Neck: No stridor.   Cardiovascular: Normal rate, regular rhythm. Good peripheral circulation. Grossly normal heart sounds.   Respiratory: Normal respiratory effort.  No retractions. Lungs CTAB. Gastrointestinal: Soft and nontender. No distention.  Musculoskeletal: No gross deformities of extremities. Neurologic:  Normal speech and language. No gross focal neurologic deficits are appreciated.  Skin:  Skin is warm, dry and intact. No rash noted.   ____________________________________________   LABS (all labs ordered are listed, but only abnormal results are displayed)  Labs Reviewed  BASIC METABOLIC PANEL WITH GFR - Abnormal; Notable for the following components:      Result Value   CO2 18 (*)    Glucose, Bld 135 (*)    BUN 35 (*)    Anion gap 18 (*)    All other components within normal limits  URINALYSIS, W/ REFLEX TO CULTURE (INFECTION SUSPECTED) - Abnormal; Notable for the following components:   Color, Urine STRAW (*)    Leukocytes,Ua SMALL (*)    Bacteria, UA RARE (*)    All other components within normal limits  CBC WITH DIFFERENTIAL/PLATELET -  Abnormal; Notable for the following components:   RBC 3.69 (*)    Hemoglobin 11.0 (*)    HCT 34.3 (*)    Neutro Abs 8.1 (*)    All other components within normal limits  CBC WITH DIFFERENTIAL/PLATELET  TROPONIN I (HIGH SENSITIVITY)   ____________________________________________  EKG  NSR. Rate: 69. QTc 428. Narrow QRS. No STEMI.   ____________________________________________   PROCEDURES  Procedure(s) performed:   Procedures  None  ____________________________________________   INITIAL IMPRESSION / ASSESSMENT AND PLAN / ED COURSE  Pertinent labs & imaging results that were available during my care of the patient were reviewed by me and considered in my medical decision making  (see chart for details).   This patient is Presenting for Evaluation of syncope, which does require a range of treatment options, and is a complaint that involves a high risk of morbidity and mortality.  The Differential Diagnoses include cardiogenic etiology, orthostatic hypotension, anemia, dehydration, etc.  Critical Interventions-    Medications  sodium chloride  0.9 % bolus 500 mL (0 mLs Intravenous Stopped 01/14/24 1658)    Reassessment after intervention:  patient feeling improved.   Clinical Laboratory Tests Ordered, included CBC with mild anemia to 11.0.  Basic metabolic panel shows low CO2 but normal creatinine and electrolytes.  Considered euglycemic DKA but patient without other DKA vital sign abnormalities or symptoms to strongly suggest this diagnosis. Suspect acute volume depletion.   Cardiac Monitor Tracing which shows NSR.    Social Determinants of Health Risk patient is a non-smoker.   Medical Decision Making: Summary:  Patient presents the emergency department with syncope.  Presentation sounds vasovagal in nature.  She has been adhering to strict fluid restriction and starting new medications to assist with her hyponatremia.  Suspect these may have led to her event today.  Reevaluation with update and discussion with patient.  Plan for IV fluid bolus and discharge.  She is feeling much better.  Mild acidosis noted on labs which I believe is consistent with acute dehydration.  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  Considered admission but ED workup is largely reassuring.  Stable for discharge.  Patient's presentation is most consistent with acute presentation with potential threat to life or bodily function.   Disposition: discharge  ____________________________________________  FINAL CLINICAL IMPRESSION(S) / ED DIAGNOSES  Final diagnoses:  Syncope  and collapse    Note:  This document was prepared using Dragon voice recognition software and may include unintentional dictation errors.  Fonda Law, MD, [facility] Emergency Medicine    Shekina Cordell, Fonda MATSU, MD 01/15/24 [phone]

## 2024-01-14 NOTE — ED Triage Notes (Addendum)
 Pt bib [facility] from work where she started feeling lightheaded and passed out for 30sec- 1 min. Pt states that she had been feeling weak and lightheaded this morning before work but thought she was okay to continue working. Denies falling due to being in a chair at the time of the incident. EMS gave pt 500cc NS en route. Pt was recently discharged after having hyponatremia and a GI bleed but was only placed on pepcid  and told to drink less. Pt denies any bleeding at this time. AOX4 on arrival

## 2024-01-14 NOTE — ED Notes (Signed)
 Per lab, CBC needs to be recollected

## 2024-01-18 ENCOUNTER — Other Ambulatory Visit (HOSPITAL_BASED_OUTPATIENT_CLINIC_OR_DEPARTMENT_OTHER): Payer: Self-pay

## 2024-01-18 ENCOUNTER — Other Ambulatory Visit: Payer: Self-pay

## 2024-01-19 ENCOUNTER — Other Ambulatory Visit (HOSPITAL_BASED_OUTPATIENT_CLINIC_OR_DEPARTMENT_OTHER): Payer: Self-pay

## 2024-01-21 ENCOUNTER — Other Ambulatory Visit (HOSPITAL_BASED_OUTPATIENT_CLINIC_OR_DEPARTMENT_OTHER): Payer: Self-pay

## 2024-01-21 DIAGNOSIS — R252 Cramp and spasm: Secondary | ICD-10-CM | POA: Diagnosis not present

## 2024-01-21 DIAGNOSIS — E11319 Type 2 diabetes mellitus with unspecified diabetic retinopathy without macular edema: Secondary | ICD-10-CM | POA: Diagnosis not present

## 2024-01-21 DIAGNOSIS — E7800 Pure hypercholesterolemia, unspecified: Secondary | ICD-10-CM | POA: Diagnosis not present

## 2024-01-21 DIAGNOSIS — E1165 Type 2 diabetes mellitus with hyperglycemia: Secondary | ICD-10-CM | POA: Diagnosis not present

## 2024-01-21 DIAGNOSIS — G4730 Sleep apnea, unspecified: Secondary | ICD-10-CM | POA: Diagnosis not present

## 2024-01-21 DIAGNOSIS — F319 Bipolar disorder, unspecified: Secondary | ICD-10-CM | POA: Diagnosis not present

## 2024-01-21 DIAGNOSIS — I10 Essential (primary) hypertension: Secondary | ICD-10-CM | POA: Diagnosis not present

## 2024-01-21 DIAGNOSIS — G629 Polyneuropathy, unspecified: Secondary | ICD-10-CM | POA: Diagnosis not present

## 2024-01-21 DIAGNOSIS — E871 Hypo-osmolality and hyponatremia: Secondary | ICD-10-CM | POA: Diagnosis not present

## 2024-01-21 DIAGNOSIS — L309 Dermatitis, unspecified: Secondary | ICD-10-CM | POA: Diagnosis not present

## 2024-01-21 MED ORDER — CYCLOBENZAPRINE HCL 10 MG PO TABS
10.0000 mg | ORAL_TABLET | Freq: Every day | ORAL | 1 refills | Status: AC
  Filled 2024-01-21: qty 90, 90d supply, fill #0
  Filled 2024-04-14: qty 90, 90d supply, fill #1

## 2024-01-25 ENCOUNTER — Other Ambulatory Visit (HOSPITAL_BASED_OUTPATIENT_CLINIC_OR_DEPARTMENT_OTHER): Payer: Self-pay

## 2024-01-26 ENCOUNTER — Encounter: Payer: Self-pay | Admitting: Hematology and Oncology

## 2024-01-26 ENCOUNTER — Inpatient Hospital Stay: Attending: Hematology and Oncology

## 2024-01-26 ENCOUNTER — Inpatient Hospital Stay (HOSPITAL_BASED_OUTPATIENT_CLINIC_OR_DEPARTMENT_OTHER): Admitting: Hematology and Oncology

## 2024-01-26 VITALS — BP 170/70 | HR 70 | Temp 97.8°F | Resp 18 | Ht 69.0 in | Wt 283.4 lb

## 2024-01-26 DIAGNOSIS — D539 Nutritional anemia, unspecified: Secondary | ICD-10-CM | POA: Insufficient documentation

## 2024-01-26 DIAGNOSIS — Z9884 Bariatric surgery status: Secondary | ICD-10-CM | POA: Insufficient documentation

## 2024-01-26 DIAGNOSIS — Z83719 Family history of colon polyps, unspecified: Secondary | ICD-10-CM

## 2024-01-26 DIAGNOSIS — Z79899 Other long term (current) drug therapy: Secondary | ICD-10-CM | POA: Insufficient documentation

## 2024-01-26 LAB — FERRITIN: Ferritin: 21 ng/mL (ref 11–307)

## 2024-01-26 LAB — CBC WITH DIFFERENTIAL (CANCER CENTER ONLY)
Abs Immature Granulocytes: 0.02 K/uL (ref 0.00–0.07)
Basophils Absolute: 0.1 K/uL (ref 0.0–0.1)
Basophils Relative: 1 %
Eosinophils Absolute: 0.5 K/uL (ref 0.0–0.5)
Eosinophils Relative: 6 %
HCT: 36.8 % (ref 36.0–46.0)
Hemoglobin: 12.2 g/dL (ref 12.0–15.0)
Immature Granulocytes: 0 %
Lymphocytes Relative: 23 %
Lymphs Abs: 1.9 K/uL (ref 0.7–4.0)
MCH: 29.8 pg (ref 26.0–34.0)
MCHC: 33.2 g/dL (ref 30.0–36.0)
MCV: 90 fL (ref 80.0–100.0)
Monocytes Absolute: 0.7 K/uL (ref 0.1–1.0)
Monocytes Relative: 9 %
Neutro Abs: 5.2 K/uL (ref 1.7–7.7)
Neutrophils Relative %: 61 %
Platelet Count: 349 K/uL (ref 150–400)
RBC: 4.09 MIL/uL (ref 3.87–5.11)
RDW: 11.8 % (ref 11.5–15.5)
WBC Count: 8.4 K/uL (ref 4.0–10.5)
nRBC: 0 % (ref 0.0–0.2)

## 2024-01-26 LAB — RETICULOCYTES
Immature Retic Fract: 6.7 % (ref 2.3–15.9)
RBC.: 4.03 MIL/uL (ref 3.87–5.11)
Retic Count, Absolute: 53.6 K/uL (ref 19.0–186.0)
Retic Ct Pct: 1.3 % (ref 0.4–3.1)

## 2024-01-26 LAB — IRON AND IRON BINDING CAPACITY (CC-WL,HP ONLY)
Iron: 60 ug/dL (ref 28–170)
Saturation Ratios: 17 % (ref 10.4–31.8)
TIBC: 350 ug/dL (ref 250–450)
UIBC: 290 ug/dL (ref 148–442)

## 2024-01-26 LAB — SEDIMENTATION RATE: Sed Rate: 42 mm/h — ABNORMAL HIGH (ref 0–22)

## 2024-01-26 NOTE — Assessment & Plan Note (Signed)
 Due to history of bariatric surgery, she is at risk of other trace mineral deficiencies I have ordered additional labs and we will call her with test results

## 2024-01-26 NOTE — Assessment & Plan Note (Signed)
 The cause of anemia is multifactorial, likely due to recent GI bleed, poor iron rich food consumption as well as history of Roux-en-Y gastric bypass surgery Thankfully, repeat CBC today showed resolution of anemia The patient will continue oral iron supplement She does not need long-term follow-up with me

## 2024-01-26 NOTE — Progress Notes (Signed)
 [facility] CONSULT NOTE  Patient Care Team: Seabron Lenis, MD as PCP - General (Family Medicine) O'Neal, Darryle Ned, MD as PCP - Cardiology (Cardiology) Cleotilde Ronal RAMAN, MD as Consulting Physician (Gynecology) Trixie File, MD as Consulting Physician (Internal Medicine) Joshua Pao, NP (Nurse Practitioner) Skeet Juliene SAUNDERS, DO as Consulting Physician (Neurology)  ASSESSMENT & PLAN:  Deficiency anemia The cause of anemia is multifactorial, likely due to recent GI bleed, poor iron rich food consumption as well as history of Roux-en-Y gastric bypass surgery Thankfully, repeat CBC today showed resolution of anemia The patient will continue oral iron supplement She does not need long-term follow-up with me  S/P bariatric surgery Due to history of bariatric surgery, she is at risk of other trace mineral deficiencies I have ordered additional labs and we will call her with test results Orders Placed This Encounter  Procedures   Ferritin    Standing Status:   Future    Number of Occurrences:   1    Expiration Date:   01/25/2025   Iron and Iron Binding Capacity ([facility] only)    Standing Status:   Future    Number of Occurrences:   1    Expiration Date:   01/25/2025   CBC with Differential (Cancer Center Only)    Standing Status:   Future    Number of Occurrences:   1    Expiration Date:   01/25/2025   Reticulocytes    Standing Status:   Future    Number of Occurrences:   1    Expiration Date:   01/25/2025   Sedimentation rate    Standing Status:   Future    Number of Occurrences:   1    Expiration Date:   01/25/2025   Vitamin B1    Standing Status:   Future    Number of Occurrences:   1    Expiration Date:   01/25/2025   Copper , serum    Standing Status:   Future    Number of Occurrences:   1    Expiration Date:   01/25/2025   Zinc     Standing Status:   Future    Number of Occurrences:   1    Expiration Date:   01/25/2025    All questions were answered. The  patient knows to call the clinic with any problems, questions or concerns.  The total time spent in the appointment was 60 minutes encounter with patients including review of chart and various tests results, discussions about plan of care and coordination of care plan  Crystal Bedford, MD 9/2/20252:31 PM   CHIEF COMPLAINTS/PURPOSE OF CONSULTATION:  Anemia  HISTORY OF PRESENTING ILLNESS:  [name] 53 y.o. female is here because of anemia  She was found to have abnormal CBC from recent blood work I have the opportunity to review his CBC dated back to 2013 The patient had Roux-en-Y gastric bypass surgery in 2017 At peak, her maximum weight was 359 pounds and  she gradually lost a lot of weight to as low as 243 pounds Over the last few years, her hemoglobin fluctuated up and down On December 11, 2023, she was hospitalized with GI bleed Her hemoglobin went down to as low as 9.6 The patient was taking a lot of Aleve  for diffuse arthritis and back pain She had EGD on December 17, 2023 which show gastritis, biopsy negative for H. pylori She was placed on Pepcid  twice daily and was told not to  take NSAID She has been taking oral iron supplement daily with lunch  She denies recent chest pain on exertion, shortness of breath on minimal exertion, pre-syncopal episodes, or palpitations. She had not noticed any recent bleeding such as epistaxis, hematuria or hematochezia The patient denies over the counter NSAID ingestion since. She is not on antiplatelets agents. Her last colonoscopy was in 2021 She had no prior history or diagnosis of cancer. Her age appropriate screening programs are up-to-date. She denies any pica and eats a variety of diet. She never donated blood or received blood transfusion The patient was prescribed oral iron supplements and she takes 1 daily around lunchtime  MEDICAL HISTORY:  Past Medical History:  Diagnosis Date   Allergy    seasonal allergies   Anemia    hx of    Anxiety    on meds   Asthma    hx of   Bipolar disorder (HCC)    Cataract    bilateral sx   Chicken pox    Complication of anesthesia    oxygen saturation dropped after hysterectomy 2009 at [facility]   Depression    on meds   Diabetes mellitus    on meds   Dyspnea    GERD (gastroesophageal reflux disease)    hx of   Hyperlipidemia    on meds   Hypertension    readings   Hypothyroidism    hx of-LEFT thyroid  removed   Irregular heartbeat    saw dr Micky sees -cardiology as needed   Migraine    Sleep apnea    uses cpap    SURGICAL HISTORY: Past Surgical History:  Procedure Laterality Date   ANKLE SURGERY Left 1989   BACK SURGERY  1999   BONE BIOPSY  12/13/2023   Procedure: BIOPSY, GI;  Surgeon: Suzann Inocente HERO, MD;  Location: [facility] ENDOSCOPY;  Service: Gastroenterology;;   CATARACT EXTRACTION, BILATERAL     CESAREAN SECTION     1995   ESOPHAGOGASTRODUODENOSCOPY N/A 12/13/2023   Procedure: EGD (ESOPHAGOGASTRODUODENOSCOPY);  Surgeon: Suzann Inocente HERO, MD;  Location: [facility] ENDOSCOPY;  Service: Gastroenterology;  Laterality: N/A;   LAPAROSCOPIC ASSISTED VAGINAL HYSTERECTOMY  2009     BSO fibroids, DUB, pelvic pain   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 REPAIR N/A 04/14/2016   Procedure: LAPAROSCOPIC ROUX-EN-Y GASTRIC BYPASS  WITH UPPER ENDOSCOPY;  Surgeon: Morene Olives, MD;  Location: [facility];  Service: General;  Laterality: N/A;   SHOULDER ARTHROSCOPY WITH OPEN ROTATOR CUFF REPAIR Right 01/02/2017   Procedure: Right shoulder arthroscopy, A-subcromial decompression, mini open rotator cuff repair, open distal clavicle resection, biceps tenodesis;  Surgeon: Kay Kemps, MD;  Location: [facility] OR;  Service: Orthopedics;  Laterality: Right;   THYROIDECTOMY, PARTIAL Left 2001   UMBILICAL HERNIA REPAIR  2003   WISDOM TOOTH EXTRACTION      SOCIAL HISTORY: Social History   Socioeconomic History   Marital status: Married    Spouse name: Not on file   Number of  children: 1   Years of education: Not on file   Highest education level: Associate degree: academic program  Occupational History   Occupation: nursing  Tobacco Use   Smoking status: Never   Smokeless tobacco: Never  Vaping Use   Vaping status: Never Used  Substance and Sexual Activity   Alcohol use: No    Alcohol/week: 0.0 standard drinks of alcohol   Drug use: No   Sexual activity: Yes    Partners: Male  Other Topics Concern  Not on file  Social History Narrative   Not on file   Social Drivers of Health   Financial Resource Strain: Low Risk  (12/24/2022)   Overall Financial Resource Strain (CARDIA)    Difficulty of Paying Living Expenses: Not hard at all  Food Insecurity: No Food Insecurity (01/26/2024)   Hunger Vital Sign    Worried About Running Out of Food in the Last Year: Never true    Ran Out of Food in the Last Year: Never true  Transportation Needs: No Transportation Needs (01/26/2024)   PRAPARE - Administrator, Civil Service (Medical): No    Lack of Transportation (Non-Medical): No  Physical Activity: Unknown (12/24/2022)   Exercise Vital Sign    Days of Exercise per Week: 0 days    Minutes of Exercise per Session: Not on file  Stress: No Stress Concern Present (12/24/2022)   [employer] of Occupational Health - Occupational Stress Questionnaire    Feeling of Stress : Not at all  Social Connections: Moderately Isolated (12/24/2022)   Social Connection and Isolation Panel    Frequency of Communication with Friends and Family: Twice a week    Frequency of Social Gatherings with Friends and Family: Once a week    Attends Religious Services: Never    Database administrator or Organizations: No    Attends Engineer, structural: Not on file    Marital Status: Married  Catering manager Violence: Not At Risk (01/26/2024)   Humiliation, Afraid, Rape, and Kick questionnaire    Fear of Current or Ex-Partner: No    Emotionally Abused: No     Physically Abused: No    Sexually Abused: No    FAMILY HISTORY: Family History  Problem Relation Age of Onset   Hyperlipidemia Mother    Diabetes Mother    Anxiety disorder Mother    Heart disease Mother    Hypertension Father    Lupus Father    Heart disease Father    Asthma Father    Colon polyps Father 24   Stroke Maternal Aunt    Seizures Paternal Grandfather    Stroke Paternal Grandfather    Mental illness Paternal Grandfather    Breast cancer Neg Hx    Colon cancer Neg Hx    Esophageal cancer Neg Hx    Stomach cancer Neg Hx    Rectal cancer Neg Hx     ALLERGIES:  is allergic to zofran  [ondansetron ].  MEDICATIONS:  Current Outpatient Medications  Medication Sig Dispense Refill   acetaminophen  (TYLENOL ) 500 MG tablet Take 500-1,000 mg by mouth every 6 (six) hours as needed for moderate pain (pain score 4-6).     atorvastatin  (LIPITOR) 40 MG tablet Take 1 tablet (40 mg total) by mouth daily. 90 tablet 1   Biotin 10000 MCG TABS Take 1 tablet by mouth daily.     carvedilol  (COREG ) 6.25 MG tablet Take 1 tablet (6.25 mg total) by mouth 2 (two) times daily with food. 180 tablet 3   Continuous Glucose Sensor (DEXCOM G7 SENSOR) MISC Apply 1 sensor every 10 days as directed. 9 each 4   cyclobenzaprine  (FLEXERIL ) 10 MG tablet Take 1 tablet (10 mg total) by mouth daily. 90 tablet 1   Dulaglutide  (TRULICITY ) 3 MG/0.5ML SOAJ Inject 3 mg into the skin once a week. 6 mL 3   famotidine  (PEPCID ) 20 MG tablet Take 1 tablet (20 mg total) by mouth 2 (two) times daily. 60 tablet 11   gabapentin  (  NEURONTIN ) 800 MG tablet Take 1 tablet (800 mg total) by mouth 3 (three) times daily. 90 tablet 5   Glucosamine HCl (GLUCOSAMINE PO) Take 3 tablets by mouth daily.     glucose blood (FREESTYLE LITE) test strip Use to check blood sugar 2 times daily 100 each 3   lamoTRIgine  (LAMICTAL ) 200 MG tablet Take 1.5 tablets (300 mg total) by mouth at bedtime. 135 tablet 0   lisinopril  (ZESTRIL ) 40 MG tablet  Take 1 tablet (40 mg total) by mouth daily. 90 tablet 3   Loratadine (CLARITIN PO) Take 1 tablet by mouth at bedtime.     magnesium  oxide (MAG-OX) 400 MG tablet Take 1 tablet (400 mg total) by mouth daily. 120 tablet 0   metFORMIN  (GLUCOPHAGE ) 1000 MG tablet Take 1 tablet (1,000 mg total) by mouth daily with supper. 90 tablet 3   Multiple Vitamin (MULTIVITAMIN) tablet Take 1 tablet by mouth daily.     spironolactone  (ALDACTONE ) 25 MG tablet Take 1 tablet (25 mg total) by mouth daily. 90 tablet 1   TEGRETOL -XR 200 MG 12 hr tablet Take 1 tablet (200 mg total) by mouth in the morning AND 2 tablets (400 mg total) at bedtime. 270 tablet 0   tiaGABine  (GABITRIL ) 4 MG tablet Take 1 tablet (4 mg total) by mouth in the morning and at bedtime. 180 tablet 0   Turmeric (QC TUMERIC COMPLEX PO) Take 1 Dose by mouth daily.     urea  (URE-NA) 15 g PACK oral packet Take 15 g by mouth 2 (two) times daily. 64 packet 3   No current facility-administered medications for this visit.    REVIEW OF SYSTEMS:   Constitutional: Denies fevers, chills or abnormal night sweats Eyes: Denies blurriness of vision, double vision or watery eyes Ears, nose, mouth, throat, and face: Denies mucositis or sore throat Respiratory: Denies cough, dyspnea or wheezes Cardiovascular: Denies palpitation, chest discomfort or lower extremity swelling Gastrointestinal:  Denies nausea, heartburn or change in bowel habits Skin: Denies abnormal skin rashes Lymphatics: Denies new lymphadenopathy or easy bruising Neurological:Denies numbness, tingling or new weaknesses Behavioral/Psych: Mood is stable, no new changes  All other systems were reviewed with the patient and are negative.  PHYSICAL EXAMINATION: ECOG PERFORMANCE STATUS: 0 - Asymptomatic  Vitals:   01/26/24 1248  BP: (!) 170/70  Pulse: 70  Resp: 18  Temp: 97.8 F (36.6 C)  SpO2: 100%   Filed Weights   01/26/24 1248  Weight: 283 lb 6.4 oz (128.5 kg)    GENERAL:alert, no  distress and comfortable SKIN: skin color, texture, turgor are normal, no rashes or significant lesions EYES: normal, conjunctiva are pink and non-injected, sclera clear OROPHARYNX:no exudate, no erythema and lips, buccal mucosa, and tongue normal  NECK: supple, thyroid  normal size, non-tender, without nodularity LYMPH:  no palpable lymphadenopathy in the cervical, axillary or inguinal LUNGS: clear to auscultation and percussion with normal breathing effort HEART: regular rate & rhythm and no murmurs and no lower extremity edema ABDOMEN:abdomen soft, non-tender and normal bowel sounds Musculoskeletal:no cyanosis of digits and no clubbing  PSYCH: alert & oriented x 3 with fluent speech NEURO: no focal motor/sensory deficits

## 2024-01-28 LAB — ZINC: Zinc: 61 ug/dL (ref 44–115)

## 2024-01-28 LAB — COPPER, SERUM: Copper: 127 ug/dL (ref 80–158)

## 2024-01-29 ENCOUNTER — Telehealth: Payer: Self-pay

## 2024-01-29 DIAGNOSIS — I10 Essential (primary) hypertension: Secondary | ICD-10-CM | POA: Diagnosis not present

## 2024-01-29 DIAGNOSIS — R55 Syncope and collapse: Secondary | ICD-10-CM | POA: Diagnosis not present

## 2024-01-29 LAB — VITAMIN B1: Vitamin B1 (Thiamine): 119.4 nmol/L (ref 66.5–200.0)

## 2024-01-29 NOTE — Telephone Encounter (Addendum)
 Called patient as per Dr. Lonn, relayed the message below, she had no further questions at this time.    ----- Message from Almarie Lonn sent at 01/29/2024  2:44 PM EDT ----- Pls call her All other mineral studies (copper , zinc , etc looks ok)

## 2024-02-03 NOTE — Progress Notes (Unsigned)
 Cardiology Office Note:  .   Date:  02/04/2024  ID:  [name], DOB 1970-06-05, MRN [id number] PCP: Seabron Lenis, MD  [facility] Providers Cardiologist:  Darryle ONEIDA Decent, MD {   History of Present Illness: .    Chief Complaint  Patient presents with   Follow-up         [name] is a 53 y.o. female with history of HTN, OSA, DM who presents for follow-up. Had a syncopal episode 01/14/2024 attributed to dehydration. Diagnosed with SIADH.    History of Present Illness   [name] is a 53 year old female with diabetes, obesity status post gastric bypass surgery, hypertension, and SIADH who presents for follow-up after a syncopal episode.  She experienced a syncopal episode on January 14, 2024, while at work. She felt weak and lightheaded before the episode, which occurred around 10:30 AM. She was sitting at her desk and had consumed approximately 20 to 24 ounces of water  by that time. Upon standing, she felt very lightheaded, lost her balance, and collapsed onto a countertop. She was unconscious for about five minutes. No chest pain or trouble breathing before the episode. Her blood pressure readings were low at the time, and she had taken her blood pressure medication that morning.  She has a history of SIADH and was advised to restrict her fluid intake to 52 ounces per day, which was later increased to 60 to 80 ounces per day. She is also under the care of a nephrologist at [facility] for her SIADH.  Her current medications include carvedilol  6.25 mg twice a day and spironolactone  25 mg, which she takes at lunch. She was previously on lisinopril  but was advised to hold it due to low blood pressure readings. She has been monitoring her blood pressure at home, noting fluctuations, particularly when at work.  She has a history of diabetes, managed with the assistance of an endocrinologist, and has been using a Dexcom for glucose monitoring. She has lost  about 20 pounds recently by improving her diet, including more fruits and vegetables.          Problem List 1. Diabetes -A1c 7.2 2. HLD -T chol 172, HDL 58, LDL 88, TG 127 3. BMI 41 -s/p gastric bypass 2017 4. OSA 5. HTN 6. SIADH 7. GI bleed  -2/2 gastritis  -11/2023    ROS: All other ROS reviewed and negative. Pertinent positives noted in the HPI.     Studies Reviewed: [name]       EKG 01/14/2024: NSR, no acute changes Physical Exam:   VS:  BP 125/84 (BP Location: Right Arm, Cuff Size: Large)   Pulse 88   Ht 5' 9 (1.753 m)   Wt 279 lb (126.6 kg)   LMP 01/25/2008   SpO2 97%   BMI 41.20 kg/m    Wt Readings from Last 3 Encounters:  02/04/24 279 lb (126.6 kg)  01/26/24 283 lb 6.4 oz (128.5 kg)  12/31/23 288 lb (130.6 kg)    GEN: Well nourished, well developed in no acute distress NECK: No JVD; No carotid bruits CARDIAC: RRR, no murmurs, rubs, gallops RESPIRATORY:  Clear to auscultation without rales, wheezing or rhonchi  ABDOMEN: Soft, non-tender, non-distended EXTREMITIES:  No edema; No deformity  ASSESSMENT AND PLAN: .   Assessment and Plan    Syncope secondary to Dehydration and antihypertensive therapy Syncope likely due to dehydration from SIADH (fluid restriction) and antihypertensive overmedication. Recent episode at work with  low blood pressure. Exacerbated by fluid restriction and medication regimen. - Stop spironolactone . - Continue carvedilol  6.25 mg BID. - Restart lisinopril  20 mg daily. - Record blood pressure twice daily, morning and evening, with date and time. - Order echocardiogram for syncope evaluation. - Instruct to call if blood pressure remains high for medication adjustment.  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 Current fluid restriction may be too severe, contributing to syncope. - Continue follow-up with nephrologist for SIADH management. - Adjust fluid intake to  60-80 ounces per day.  Dehydration secondary to SIADH and diuretic use Dehydration from fluid restriction and spironolactone  use, contributing to syncope and low blood pressure. - Stop spironolactone . - Increase fluid intake to 60-80 ounces per day.  Essential hypertension Hypertension management complicated by SIADH and medication adjustments. Variable blood pressure readings, recently high after holding lisinopril . - Continue carvedilol  6.25 mg BID. - Restart lisinopril  20 mg daily. - Monitor blood pressure twice daily. - Adjust lisinopril  dosage as needed based on blood pressure readings.              Follow-up: Return in about 6 months (around 08/03/2024).   Signed, Darryle DASEN. Barbaraann, MD, [facility]  [facility]  [address] [place], [place] [address] [phone]  8:48 AM

## 2024-02-04 ENCOUNTER — Encounter: Payer: Self-pay | Admitting: Cardiovascular Disease

## 2024-02-04 ENCOUNTER — Ambulatory Visit: Attending: Cardiovascular Disease | Admitting: Cardiovascular Disease

## 2024-02-04 ENCOUNTER — Other Ambulatory Visit (HOSPITAL_BASED_OUTPATIENT_CLINIC_OR_DEPARTMENT_OTHER): Payer: Self-pay

## 2024-02-04 VITALS — BP 125/84 | HR 88 | Ht 69.0 in | Wt 279.0 lb

## 2024-02-04 DIAGNOSIS — R55 Syncope and collapse: Secondary | ICD-10-CM

## 2024-02-04 DIAGNOSIS — I10 Essential (primary) hypertension: Secondary | ICD-10-CM

## 2024-02-04 DIAGNOSIS — R002 Palpitations: Secondary | ICD-10-CM

## 2024-02-04 DIAGNOSIS — E860 Dehydration: Secondary | ICD-10-CM

## 2024-02-04 MED ORDER — LISINOPRIL 20 MG PO TABS
20.0000 mg | ORAL_TABLET | Freq: Every day | ORAL | 3 refills | Status: DC
Start: 2024-02-04 — End: 2024-03-31
  Filled 2024-02-04: qty 90, 90d supply, fill #0

## 2024-02-04 NOTE — Patient Instructions (Addendum)
 Medication Instructions:   Stop Spironolactone    Decrease Lisinopril  to 20 mg  Continue all othe medications *If you need a refill on your cardiac medications before your next appointment, please call your pharmacy*   Lab Work: Not needed If you have labs (blood work) drawn today and your tests are completely normal, you will receive your results only by: MyChart Message (if you have MyChart) OR A paper copy in the mail If you have any lab test that is abnormal or we need to change your treatment, we will call you to review the results.   Testing/Procedures:  Your physician has requested that you have an echocardiogram. Echocardiography is a painless test that uses sound waves to create images of your heart. It provides your doctor with information about the size and shape of your heart and how well your heart's chambers and valves are working. This procedure takes approximately one hour. There are no restrictions for this procedure. Please do NOT wear cologne, perfume, aftershave, or lotions (deodorant is allowed). Please arrive 15 minutes prior to your appointment time.  Please note: We ask at that you not bring children with you during ultrasound (echo/ vascular) testing. Due to room size and safety concerns, children are not allowed in the ultrasound rooms during exams. Our front office staff cannot provide observation of children in our lobby area while testing is being conducted. An adult accompanying a patient to their appointment will only be allowed in the ultrasound room at the discretion of the ultrasound technician under special circumstances. We apologize for any inconvenience.   Follow-Up: At [facility], you and your health needs are our priority.  As part of our continuing mission to provide you with exceptional heart care, we have created designated Provider Care Teams.  These Care Teams include your primary Cardiologist (physician) and Advanced Practice Providers  (APPs -  Physician Assistants and Nurse Practitioners) who all work together to provide you with the care you need, when you need it.     Your next appointment:   6 month(s)  The format for your next appointment:   In Person  Provider:   Josefa Beauvais, NP      Then, Darryle ONEIDA Decent, MD will plan to see you again in 12 month(s).   Other Instructions    Check blood pressure twice a day  day and evening  ( date and time ) for next 3 weeks  send  a copy to the office.

## 2024-02-08 ENCOUNTER — Other Ambulatory Visit (HOSPITAL_BASED_OUTPATIENT_CLINIC_OR_DEPARTMENT_OTHER): Payer: Self-pay

## 2024-02-09 ENCOUNTER — Other Ambulatory Visit (HOSPITAL_BASED_OUTPATIENT_CLINIC_OR_DEPARTMENT_OTHER): Payer: Self-pay

## 2024-02-09 ENCOUNTER — Other Ambulatory Visit: Payer: Self-pay

## 2024-02-10 ENCOUNTER — Other Ambulatory Visit (HOSPITAL_BASED_OUTPATIENT_CLINIC_OR_DEPARTMENT_OTHER): Payer: Self-pay

## 2024-02-10 ENCOUNTER — Other Ambulatory Visit: Payer: Self-pay

## 2024-02-10 MED ORDER — LAMOTRIGINE 200 MG PO TABS
300.0000 mg | ORAL_TABLET | Freq: Every day | ORAL | 0 refills | Status: DC
  Filled 2024-02-10: qty 135, 90d supply, fill #0

## 2024-02-10 MED ORDER — TEGRETOL-XR 200 MG PO TB12
ORAL_TABLET | ORAL | 0 refills | Status: AC
  Filled 2024-02-10: qty 270, 90d supply, fill #0

## 2024-02-10 MED ORDER — TIAGABINE HCL 4 MG PO TABS
4.0000 mg | ORAL_TABLET | Freq: Two times a day (BID) | ORAL | 0 refills | Status: AC
  Filled 2024-02-10 – 2024-04-15 (×2): qty 180, 90d supply, fill #0

## 2024-02-12 ENCOUNTER — Other Ambulatory Visit (HOSPITAL_BASED_OUTPATIENT_CLINIC_OR_DEPARTMENT_OTHER): Payer: Self-pay

## 2024-02-12 DIAGNOSIS — K219 Gastro-esophageal reflux disease without esophagitis: Secondary | ICD-10-CM | POA: Diagnosis not present

## 2024-02-12 DIAGNOSIS — D509 Iron deficiency anemia, unspecified: Secondary | ICD-10-CM | POA: Diagnosis not present

## 2024-02-12 MED ORDER — FAMOTIDINE 20 MG PO TABS
20.0000 mg | ORAL_TABLET | Freq: Two times a day (BID) | ORAL | 3 refills | Status: AC
  Filled 2024-02-12: qty 180, 90d supply, fill #0

## 2024-02-25 DIAGNOSIS — H52203 Unspecified astigmatism, bilateral: Secondary | ICD-10-CM | POA: Diagnosis not present

## 2024-02-25 LAB — OPHTHALMOLOGY REPORT-SCANNED

## 2024-02-26 ENCOUNTER — Other Ambulatory Visit: Payer: Self-pay

## 2024-02-26 ENCOUNTER — Other Ambulatory Visit (HOSPITAL_BASED_OUTPATIENT_CLINIC_OR_DEPARTMENT_OTHER): Payer: Self-pay

## 2024-02-26 ENCOUNTER — Other Ambulatory Visit: Payer: Self-pay | Admitting: Internal Medicine

## 2024-02-26 DIAGNOSIS — E871 Hypo-osmolality and hyponatremia: Secondary | ICD-10-CM | POA: Diagnosis not present

## 2024-03-01 ENCOUNTER — Other Ambulatory Visit (HOSPITAL_BASED_OUTPATIENT_CLINIC_OR_DEPARTMENT_OTHER): Payer: Self-pay

## 2024-03-01 MED ORDER — FREESTYLE LITE TEST VI STRP
ORAL_STRIP | 3 refills | Status: AC
  Filled 2024-03-01: qty 100, 50d supply, fill #0

## 2024-03-02 ENCOUNTER — Encounter (HOSPITAL_BASED_OUTPATIENT_CLINIC_OR_DEPARTMENT_OTHER): Payer: Self-pay

## 2024-03-03 ENCOUNTER — Other Ambulatory Visit (INDEPENDENT_AMBULATORY_CARE_PROVIDER_SITE_OTHER)

## 2024-03-03 ENCOUNTER — Other Ambulatory Visit (HOSPITAL_BASED_OUTPATIENT_CLINIC_OR_DEPARTMENT_OTHER): Payer: Self-pay

## 2024-03-03 ENCOUNTER — Ambulatory Visit: Payer: Self-pay | Admitting: Cardiovascular Disease

## 2024-03-03 DIAGNOSIS — R55 Syncope and collapse: Secondary | ICD-10-CM | POA: Diagnosis not present

## 2024-03-03 DIAGNOSIS — R002 Palpitations: Secondary | ICD-10-CM | POA: Diagnosis not present

## 2024-03-03 LAB — ECHOCARDIOGRAM COMPLETE
Area-P 1/2: 3.48 cm2
S' Lateral: 2.6 cm

## 2024-03-08 NOTE — Progress Notes (Unsigned)
 [facility] [address] [place] [address] Phone: [phone] Subjective:   [name], am serving as a scribe for Dr. Arthea Claudene.  I'm seeing this patient by the request  of:  Seabron Lenis, MD  CC: Right knee and left foot pain  YEP:Dlagzrupcz  12/08/2023 Known arthritic changes but responded extremely well to the injections.  Will discuss with patient about wearing the brace and other things she can do to make it ergonomically more fitting.  Consider the possibility of viscosupplementation which patient is approved for but patient would like to hold on it at this point.  Will follow-up with me again in 8 to 10 weeks.     Injection given in the middle and lateral cuneiform joint space.  Patient responded well.  Discussed icing regimen and home exercises.  Discussed proper shoes and wearing the custom orthotics.  Wearing the recovery sandals in the house.  Follow-up again 3 months     Updated 03/10/2024 [name] is a 53 y.o. female coming in with complaint of R knee and L foot pain. Patient states that her foot pain came back after 2 months. Stepping out of shower a few weeks ago she had sharp pain over lateral aspect. Pain radiating into ankle joint. Pain same standing or sitting.   R knee will ache but has improved somewhat. Most of her pain is beneath patella especially when going up stairs. Would like to get monovisc today.        Past Medical History:  Diagnosis Date   Allergy    seasonal allergies   Anemia    hx of   Anxiety    on meds   Asthma    hx of   Bipolar disorder (HCC)    Cataract    bilateral sx   Chicken pox    Complication of anesthesia    oxygen saturation dropped after hysterectomy 2009 at [facility]   Depression    on meds   Diabetes mellitus    on meds   Dyspnea    GERD (gastroesophageal reflux disease)    hx of   Hyperlipidemia    on meds   Hypertension    readings   Hypothyroidism    hx  of-LEFT thyroid  removed   Irregular heartbeat    saw dr Micky sees -cardiology as needed   Migraine    Sleep apnea    uses cpap   Past Surgical History:  Procedure Laterality Date   ANKLE SURGERY Left 1989   BACK SURGERY  1999   BONE BIOPSY  12/13/2023   Procedure: BIOPSY, GI;  Surgeon: Suzann Inocente HERO, MD;  Location: [facility] ENDOSCOPY;  Service: Gastroenterology;;   CATARACT EXTRACTION, BILATERAL     CESAREAN SECTION     1995   ESOPHAGOGASTRODUODENOSCOPY N/A 12/13/2023   Procedure: EGD (ESOPHAGOGASTRODUODENOSCOPY);  Surgeon: Suzann Inocente HERO, MD;  Location: [facility] ENDOSCOPY;  Service: Gastroenterology;  Laterality: N/A;   LAPAROSCOPIC ASSISTED VAGINAL HYSTERECTOMY  2009     BSO fibroids, DUB, pelvic pain   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 REPAIR N/A 04/14/2016   Procedure: LAPAROSCOPIC ROUX-EN-Y GASTRIC BYPASS  WITH UPPER ENDOSCOPY;  Surgeon: Morene Olives, MD;  Location: [facility] ORS;  Service: General;  Laterality: N/A;   SHOULDER ARTHROSCOPY WITH OPEN ROTATOR CUFF REPAIR Right 01/02/2017   Procedure: Right shoulder arthroscopy, A-subcromial decompression, mini open rotator cuff repair, open distal clavicle resection, biceps tenodesis;  Surgeon: Kay Kemps, MD;  Location:  [facility] OR;  Service: Orthopedics;  Laterality: Right;   THYROIDECTOMY, PARTIAL Left 2001   UMBILICAL HERNIA REPAIR  2003   WISDOM TOOTH EXTRACTION     Social History   Socioeconomic History   Marital status: Married    Spouse name: Not on file   Number of children: 1   Years of education: Not on file   Highest education level: Associate degree: academic program  Occupational History   Occupation: nursing  Tobacco Use   Smoking status: Never   Smokeless tobacco: Never  Vaping Use   Vaping status: Never Used  Substance and Sexual Activity   Alcohol use: No    Alcohol/week: 0.0 standard drinks of alcohol   Drug use: No   Sexual activity: Yes    Partners: Male  Other Topics Concern   Not on  file  Social History Narrative   Not on file   Social Drivers of Health   Financial Resource Strain: Low Risk  (12/24/2022)   Overall Financial Resource Strain (CARDIA)    Difficulty of Paying Living Expenses: Not hard at all  Food Insecurity: No Food Insecurity (01/26/2024)   Hunger Vital Sign    Worried About Running Out of Food in the Last Year: Never true    Ran Out of Food in the Last Year: Never true  Transportation Needs: No Transportation Needs (01/26/2024)   PRAPARE - Administrator, Civil Service (Medical): No    Lack of Transportation (Non-Medical): No  Physical Activity: Unknown (12/24/2022)   Exercise Vital Sign    Days of Exercise per Week: 0 days    Minutes of Exercise per Session: Not on file  Stress: No Stress Concern Present (12/24/2022)   [employer] of Occupational Health - Occupational Stress Questionnaire    Feeling of Stress : Not at all  Social Connections: Moderately Isolated (12/24/2022)   Social Connection and Isolation Panel    Frequency of Communication with Friends and Family: Twice a week    Frequency of Social Gatherings with Friends and Family: Once a week    Attends Religious Services: Never    Database administrator or Organizations: No    Attends Engineer, structural: Not on file    Marital Status: Married   Allergies  Allergen Reactions   Zofran  [Ondansetron ] Other (See Comments)    Severe headaches    Family History  Problem Relation Age of Onset   Hyperlipidemia Mother    Diabetes Mother    Anxiety disorder Mother    Heart disease Mother    Hypertension Father    Lupus Father    Heart disease Father    Asthma Father    Colon polyps Father 94   Stroke Maternal Aunt    Seizures Paternal Grandfather    Stroke Paternal Grandfather    Mental illness Paternal Grandfather    Breast cancer Neg Hx    Colon cancer Neg Hx    Esophageal cancer Neg Hx    Stomach cancer Neg Hx    Rectal cancer Neg Hx     Current  Outpatient Medications (Endocrine & Metabolic):    Dulaglutide  (TRULICITY ) 3 MG/0.5ML SOAJ, Inject 3 mg into the skin once a week.   metFORMIN  (GLUCOPHAGE ) 1000 MG tablet, Take 1 tablet (1,000 mg total) by mouth daily with supper.  Current Outpatient Medications (Cardiovascular):    atorvastatin  (LIPITOR) 40 MG tablet, Take 1 tablet (40 mg total) by mouth daily.   carvedilol  (COREG ) 6.25 MG  tablet, Take 1 tablet (6.25 mg total) by mouth 2 (two) times daily with food.   lisinopril  (ZESTRIL ) 20 MG tablet, Take 1 tablet (20 mg total) by mouth daily.  Current Outpatient Medications (Respiratory):    Loratadine (CLARITIN PO), Take 1 tablet by mouth at bedtime.  Current Outpatient Medications (Analgesics):    acetaminophen  (TYLENOL ) 500 MG tablet, Take 500-1,000 mg by mouth every 6 (six) hours as needed for moderate pain (pain score 4-6).  Current Outpatient Medications (Hematological):    ferrous sulfate 325 (65 FE) MG tablet, Take 325 mg by mouth daily with breakfast.  Current Outpatient Medications (Other):    Ascorbic Acid (VITAMIN C) 500 MG CHEW, 1 tablet Orally Once a day   Biotin 89999 MCG TABS, Take 1 tablet by mouth daily.   Calcium  Carbonate-Vitamin D  (CALTRATE 600+D PO), Take 500 mg by mouth in the morning, at noon, and at bedtime.   Continuous Glucose Sensor (DEXCOM G7 SENSOR) MISC, Apply 1 sensor every 10 days as directed.   cyclobenzaprine  (FLEXERIL ) 10 MG tablet, Take 1 tablet (10 mg total) by mouth daily.   famotidine  (PEPCID ) 20 MG tablet, Take 1 tablet (20 mg total) by mouth 2 (two) times daily.   famotidine  (PEPCID ) 20 MG tablet, take 1 tablet Orally twice a day   gabapentin  (NEURONTIN ) 800 MG tablet, Take 1 tablet (800 mg total) by mouth 3 (three) times daily.   Glucosamine 500 MG CAPS, 3 capsules with meals Orally Once a day   Glucosamine HCl (GLUCOSAMINE PO), Take 3 tablets by mouth daily.   glucose blood (FREESTYLE LITE) test strip, Use to check blood sugar 2 times  daily   lamoTRIgine  (LAMICTAL ) 200 MG tablet, Take 2 tablets (400 mg total) by mouth at bedtime.   magnesium  oxide (MAG-OX) 400 MG tablet, Take 1 tablet (400 mg total) by mouth daily.   Multiple Vitamin (MULTIVITAMIN) tablet, Take 1 tablet by mouth daily.   TEGRETOL -XR 200 MG 12 hr tablet, Take 1 tablet (200 mg total) by mouth in the morning AND 2 tablets (400 mg total) at bedtime.   TEGRETOL -XR 200 MG 12 hr tablet, Take 1 tablet (200 mg total) by mouth every morning AND 2 tablets (400 mg total) at bedtime.   TEGRETOL -XR 200 MG 12 hr tablet, Take 1 tablet (200 mg total) by mouth every morning AND 2 tablets (400 mg total) at bedtime.   tiaGABine  (GABITRIL ) 4 MG tablet, Take 1 tablet (4 mg total) by mouth in the morning and at bedtime.   tiaGABine  (GABITRIL ) 4 MG tablet, Take 1 tablet (4 mg total) by mouth in the morning and at bedtime.   Turmeric (QC TUMERIC COMPLEX PO), Take 1 Dose by mouth daily.   urea  (URE-NA) 15 g PACK oral packet, Take 15 g by mouth 2 (two) times daily.   Reviewed prior external information including notes and imaging from  primary care provider As well as notes that were available from care everywhere and other healthcare systems.  Past medical history, social, surgical and family history all reviewed in electronic medical record.  No pertanent information unless stated regarding to the chief complaint.   Review of Systems:  No headache, visual changes, nausea, vomiting, diarrhea, constipation, dizziness, abdominal pain, skin rash, fevers, chills, night sweats, weight loss, swollen lymph nodes, body aches, joint swelling, chest pain, shortness of breath, mood changes. POSITIVE muscle aches  Objective  Blood pressure 138/84, pulse 78, height 5' 9 (1.753 m), weight 284 lb (128.8 kg), last menstrual period  01/25/2008, SpO2 98%.   General: No apparent distress alert and oriented x3 mood and affect normal, dressed appropriately.  HEENT: Pupils equal, extraocular movements  intact  Respiratory: Patient's speak in full sentences and does not appear short of breath  Cardiovascular: No lower extremity edema, non tender, no erythema  Antalgic gait noted.  Right knee does have some crepitus noted.  Some instability noted with valgus and varus force.  Effusion noted.  Patient's left foot does have swelling over the midfoot noted.   After informed written and verbal consent, patient was seated on exam table. Right knee was prepped with alcohol swab and utilizing anterolateral approach, patient's right knee space was injected with 48 mg per 3 mL of Monovisc (sodium hyaluronate) in a prefilled syringe was injected easily into the knee through a 22-gauge needle..Patient tolerated the procedure well without immediate complications.  Procedure: Real-time Ultrasound Guided Injection of left cuneiform navicular joint Device: GE Logiq Q7 Ultrasound guided injection is preferred based studies that show increased duration, increased effect, greater accuracy, decreased procedural pain, increased response rate, and decreased cost with ultrasound guided versus blind injection.  Verbal informed consent obtained.  Time-out conducted.  Noted no overlying erythema, induration, or other signs of local infection.  Skin prepped in a sterile fashion.  Local anesthesia: Topical Ethyl chloride.  With sterile technique and under real time ultrasound guidance: With a 25-gauge half inch needle injected with 0.5 cc of 0.5% Marcaine  and 0.5 cc of Kenalog 40 mg/mL. Completed without difficulty  Pain immediately resolved suggesting accurate placement of the medication.  Advised to call if fevers/chills, erythema, induration, drainage, or persistent bleeding.  Images saved Impression: Technically successful ultrasound guided injection.    Impression and Recommendations:     The above documentation has been reviewed and is accurate and complete Yonah Tangeman M Darthy Manganelli, DO

## 2024-03-09 ENCOUNTER — Other Ambulatory Visit (HOSPITAL_BASED_OUTPATIENT_CLINIC_OR_DEPARTMENT_OTHER): Payer: Self-pay

## 2024-03-09 DIAGNOSIS — F3132 Bipolar disorder, current episode depressed, moderate: Secondary | ICD-10-CM | POA: Diagnosis not present

## 2024-03-09 DIAGNOSIS — F411 Generalized anxiety disorder: Secondary | ICD-10-CM | POA: Diagnosis not present

## 2024-03-09 MED ORDER — LAMOTRIGINE 200 MG PO TABS
400.0000 mg | ORAL_TABLET | Freq: Every day | ORAL | 0 refills | Status: AC
  Filled 2024-03-09 – 2024-05-17 (×3): qty 180, 90d supply, fill #0

## 2024-03-09 MED ORDER — TEGRETOL-XR 200 MG PO TB12
ORAL_TABLET | ORAL | 0 refills | Status: AC
  Filled 2024-03-09 – 2024-05-09 (×2): qty 270, 90d supply, fill #0

## 2024-03-09 MED ORDER — TIAGABINE HCL 4 MG PO TABS
4.0000 mg | ORAL_TABLET | Freq: Two times a day (BID) | ORAL | 0 refills | Status: AC
Start: 2024-03-09 — End: ?
  Filled 2024-03-09: qty 180, 90d supply, fill #0

## 2024-03-10 ENCOUNTER — Encounter: Payer: Self-pay | Admitting: Family Medicine

## 2024-03-10 ENCOUNTER — Other Ambulatory Visit: Payer: Self-pay

## 2024-03-10 ENCOUNTER — Ambulatory Visit: Admitting: Family Medicine

## 2024-03-10 VITALS — BP 138/84 | HR 78 | Ht 69.0 in | Wt 284.0 lb

## 2024-03-10 DIAGNOSIS — M79672 Pain in left foot: Secondary | ICD-10-CM | POA: Diagnosis not present

## 2024-03-10 DIAGNOSIS — M1711 Unilateral primary osteoarthritis, right knee: Secondary | ICD-10-CM | POA: Diagnosis not present

## 2024-03-10 DIAGNOSIS — M19072 Primary osteoarthritis, left ankle and foot: Secondary | ICD-10-CM

## 2024-03-10 MED ORDER — HYALURONAN 88 MG/4ML IX SOSY
88.0000 mg | PREFILLED_SYRINGE | Freq: Once | INTRA_ARTICULAR | Status: AC
  Administered 2024-03-10: 88 mg via INTRA_ARTICULAR

## 2024-03-10 NOTE — Patient Instructions (Addendum)
 Monovisc for R knee today Injected foot today See me as scheduled

## 2024-03-10 NOTE — Assessment & Plan Note (Signed)
 Patient given injection and tolerated the procedure well, discussed icing regimen and home exercises, discussed which activities to do and which ones to avoid.  Will continue to work on weight loss.  Need BMI under 40 to even be a candidate for any type of replacement.  Follow-up again in 10 to 12 weeks

## 2024-03-10 NOTE — Assessment & Plan Note (Signed)
 Acute injury noted.  Still trying to lose weight.  Responding relatively well to injections.  Will continue to potentially change shoes and wear better shoes where possible.  Follow-up with me again in 6 to 8 weeks

## 2024-03-14 ENCOUNTER — Other Ambulatory Visit (HOSPITAL_BASED_OUTPATIENT_CLINIC_OR_DEPARTMENT_OTHER): Payer: Self-pay

## 2024-03-14 ENCOUNTER — Other Ambulatory Visit: Payer: Self-pay

## 2024-03-15 ENCOUNTER — Other Ambulatory Visit: Payer: Self-pay

## 2024-03-15 ENCOUNTER — Other Ambulatory Visit (HOSPITAL_BASED_OUTPATIENT_CLINIC_OR_DEPARTMENT_OTHER): Payer: Self-pay

## 2024-03-16 DIAGNOSIS — R0981 Nasal congestion: Secondary | ICD-10-CM | POA: Diagnosis not present

## 2024-03-16 DIAGNOSIS — R55 Syncope and collapse: Secondary | ICD-10-CM | POA: Diagnosis not present

## 2024-03-30 ENCOUNTER — Telehealth: Payer: Self-pay | Admitting: Cardiovascular Disease

## 2024-03-30 NOTE — Telephone Encounter (Signed)
 Pt c/o BP issue: STAT if pt c/o blurred vision, one-sided weakness or slurred speech.  STAT if BP is GREATER than 180/120 TODAY.  STAT if BP is LESS than 90/60 and SYMPTOMATIC TODAY  1. What is your BP concern? Hypotension  2. Have you taken any BP medication today?yes  3. What are your last 5 BP readings?  4. Are you having any other symptoms (ex. Dizziness, headache, blurred vision, passed out)? No   Pt would like a c/b from a nurse please advise

## 2024-03-30 NOTE — Telephone Encounter (Signed)
 Spoke with the patient who states that she had another episode today of her blood pressure dropping. She states she was just sitting at her desk. She had checked her blood pressure earlier and it was 138/82. She started to feel lightheaded and like she might pass out. She had someone recheck her blood pressure and it was 100/52. She states that this has happened about 5-6 times over the last couple of months. She did have an episode of syncope back in July. She states that normally she drinks a good amount of water , however today prior to the episode she only had about 5-6 ounces. Advised patient that she needs to make sure she is drinking at least 60-80 ounces of water  per day. Advised to change positions slowly. She is taking carvedilol  6.25 bid and lisinopril  20 mg daily. Advised per last note from Dr. Barbaraann that she can hold lisinopril  is blood pressure is running low. She reports average readings of 130-135/80. Will make Dr. Barbaraann aware.

## 2024-03-31 ENCOUNTER — Other Ambulatory Visit (HOSPITAL_BASED_OUTPATIENT_CLINIC_OR_DEPARTMENT_OTHER): Payer: Self-pay

## 2024-03-31 ENCOUNTER — Ambulatory Visit: Admitting: Internal Medicine

## 2024-03-31 ENCOUNTER — Other Ambulatory Visit (HOSPITAL_COMMUNITY): Payer: Self-pay

## 2024-03-31 MED ORDER — LISINOPRIL 10 MG PO TABS
10.0000 mg | ORAL_TABLET | Freq: Every day | ORAL | 3 refills | Status: AC
  Filled 2024-03-31 (×2): qty 90, 90d supply, fill #0
  Filled 2024-06-22: qty 90, 90d supply, fill #1

## 2024-04-04 ENCOUNTER — Other Ambulatory Visit: Payer: Self-pay | Admitting: Neurology

## 2024-04-04 ENCOUNTER — Other Ambulatory Visit: Payer: Self-pay

## 2024-04-05 ENCOUNTER — Other Ambulatory Visit (HOSPITAL_BASED_OUTPATIENT_CLINIC_OR_DEPARTMENT_OTHER): Payer: Self-pay

## 2024-04-05 MED ORDER — GABAPENTIN 800 MG PO TABS
800.0000 mg | ORAL_TABLET | Freq: Three times a day (TID) | ORAL | 5 refills | Status: DC
  Filled 2024-04-05: qty 90, 30d supply, fill #0
  Filled 2024-05-05: qty 90, 30d supply, fill #1
  Filled 2024-06-06: qty 90, 30d supply, fill #2

## 2024-04-11 ENCOUNTER — Other Ambulatory Visit (HOSPITAL_BASED_OUTPATIENT_CLINIC_OR_DEPARTMENT_OTHER): Payer: Self-pay

## 2024-04-14 ENCOUNTER — Other Ambulatory Visit (HOSPITAL_BASED_OUTPATIENT_CLINIC_OR_DEPARTMENT_OTHER): Payer: Self-pay

## 2024-04-14 MED ORDER — MAGNESIUM OXIDE 400 MG PO TABS
400.0000 mg | ORAL_TABLET | Freq: Every day | ORAL | 3 refills | Status: AC
  Filled 2024-04-14: qty 30, 30d supply, fill #0
  Filled 2024-05-09: qty 30, 30d supply, fill #1

## 2024-04-15 ENCOUNTER — Other Ambulatory Visit (HOSPITAL_BASED_OUTPATIENT_CLINIC_OR_DEPARTMENT_OTHER): Payer: Self-pay

## 2024-04-18 ENCOUNTER — Other Ambulatory Visit (HOSPITAL_BASED_OUTPATIENT_CLINIC_OR_DEPARTMENT_OTHER): Payer: Self-pay

## 2024-04-25 ENCOUNTER — Other Ambulatory Visit (HOSPITAL_BASED_OUTPATIENT_CLINIC_OR_DEPARTMENT_OTHER): Payer: Self-pay

## 2024-04-27 ENCOUNTER — Other Ambulatory Visit (HOSPITAL_BASED_OUTPATIENT_CLINIC_OR_DEPARTMENT_OTHER): Payer: Self-pay

## 2024-05-04 ENCOUNTER — Other Ambulatory Visit (HOSPITAL_BASED_OUTPATIENT_CLINIC_OR_DEPARTMENT_OTHER): Payer: Self-pay

## 2024-05-04 DIAGNOSIS — F411 Generalized anxiety disorder: Secondary | ICD-10-CM | POA: Diagnosis not present

## 2024-05-04 DIAGNOSIS — F3132 Bipolar disorder, current episode depressed, moderate: Secondary | ICD-10-CM | POA: Diagnosis not present

## 2024-05-04 MED ORDER — TEGRETOL-XR 200 MG PO TB12
ORAL_TABLET | ORAL | 0 refills | Status: AC
  Filled 2024-06-06 – 2024-06-07 (×2): qty 270, 180d supply, fill #0

## 2024-05-04 MED ORDER — TIAGABINE HCL 4 MG PO TABS: 4.0000 mg | ORAL_TABLET | Freq: Two times a day (BID) | ORAL | 0 refills | Status: AC

## 2024-05-04 MED ORDER — LAMOTRIGINE 200 MG PO TABS
300.0000 mg | ORAL_TABLET | Freq: Every evening | ORAL | 0 refills | Status: AC
  Filled 2024-06-06 – 2024-06-07 (×2): qty 135, 90d supply, fill #0

## 2024-05-04 MED ORDER — REXULTI 1 MG PO TABS
1.0000 mg | ORAL_TABLET | Freq: Every day | ORAL | 1 refills | Status: AC
  Filled 2024-05-04: qty 30, 30d supply, fill #0
  Filled 2024-05-27 – 2024-05-30 (×2): qty 30, 30d supply, fill #1

## 2024-05-09 ENCOUNTER — Other Ambulatory Visit: Payer: Self-pay

## 2024-05-09 ENCOUNTER — Other Ambulatory Visit (HOSPITAL_BASED_OUTPATIENT_CLINIC_OR_DEPARTMENT_OTHER): Payer: Self-pay

## 2024-05-17 ENCOUNTER — Other Ambulatory Visit (HOSPITAL_BASED_OUTPATIENT_CLINIC_OR_DEPARTMENT_OTHER): Payer: Self-pay

## 2024-05-17 ENCOUNTER — Telehealth: Admitting: Family Medicine

## 2024-05-17 DIAGNOSIS — J019 Acute sinusitis, unspecified: Secondary | ICD-10-CM | POA: Diagnosis not present

## 2024-05-17 DIAGNOSIS — B9689 Other specified bacterial agents as the cause of diseases classified elsewhere: Secondary | ICD-10-CM

## 2024-05-17 MED ORDER — AMOXICILLIN-POT CLAVULANATE 875-125 MG PO TABS
1.0000 | ORAL_TABLET | Freq: Two times a day (BID) | ORAL | 0 refills | Status: AC
  Filled 2024-05-17: qty 20, 10d supply, fill #0

## 2024-05-17 NOTE — Addendum Note (Signed)
 Addended by: ALMEDA DEGREE on: 05/17/2024 04:24 PM   Modules accepted: Orders

## 2024-05-17 NOTE — Progress Notes (Signed)
"      E-Visit for Sinus Problems  We are sorry that you are not feeling well.  Here is how we plan to help!  Based on what you have shared with me it looks like you have sinusitis.  Sinusitis is inflammation and infection in the sinus cavities of the head.  Based on your presentation I believe you most likely have Acute Bacterial Sinusitis.  This is an infection caused by bacteria and is treated with antibiotics.   I have prescribed Augmentin  875mg /125mg  one tablet twice daily with food, for 7 days.   You may use an oral decongestant such as Mucinex  max strength, ( the blue and white box)  Saline nasal spray help and can safely be used as often as needed for congestion.   If you develop worsening sinus pain, fever or notice severe headache and vision changes, or if symptoms are not better after completion of antibiotic, please schedule an appointment with a health care provider.    Sinus infections are not as easily transmitted as other respiratory infection, however we still recommend that you avoid close contact with loved ones, especially the very young and elderly.  Remember to wash your hands thoroughly throughout the day as this is the number one way to prevent the spread of infection!  Home Care: Only take medications as instructed by your medical team. Complete the entire course of an antibiotic. Do not take these medications with alcohol. A steam or ultrasonic humidifier can help congestion.  You can place a towel over your head and breathe in the steam from hot water  coming from a faucet. Avoid close contacts especially the very young and the elderly. Cover your mouth when you cough or sneeze. Always remember to wash your hands.  Get Help Right Away If: You develop worsening fever or sinus pain. You develop a severe head ache or visual changes. Your symptoms persist after you have completed your treatment plan.  Make sure you Understand these instructions. Will watch your  condition. Will get help right away if you are not doing well or get worse.  Your e-visit answers were reviewed by a board certified advanced clinical practitioner to complete your personal care plan.  Depending on the condition, your plan could have included both over the counter or prescription medications.  If there is a problem please reply  once you have received a response from your provider.  Your safety is important to us .  If you have drug allergies check your prescription carefully.    You can use MyChart to ask questions about todays visit, request a non-urgent call back, or ask for a work or school excuse for 24 hours related to this e-Visit. If it has been greater than 24 hours you will need to follow up with your provider, or enter a new e-Visit to address those concerns.  You will get an e-mail in the next two days asking about your experience.  I hope that your e-visit has been valuable and will speed your recovery. Thank you for using e-visits.  I have spent 5 minutes in review of e-visit questionnaire, review and updating patient chart, medical decision making and response to patient.   Margie Urbanowicz, PA-C     "

## 2024-05-17 NOTE — Progress Notes (Addendum)
 " [facility] [address] [place] [address] Phone: [phone] Subjective:   LILLETTE Berwyn Posey, am serving as a scribe for Dr. Arthea Claudene.  I'm seeing this patient by the request  of:  Seabron Lenis, MD  CC: foot and knee pain   YEP:Dlagzrupcz  03/10/2024 Acute injury noted.  Still trying to lose weight.  Responding relatively well to injections.  Will continue to potentially change shoes and wear better shoes where possible.  Follow-up with me again in 6 to 8 weeks     Patient given injection and tolerated the procedure well, discussed icing regimen and home exercises, discussed which activities to do and which ones to avoid.  Will continue to work on weight loss.  Need BMI under 40 to even be a candidate for any type of replacement.  Follow-up again in 10 to 12 weeks     Updated 05/23/2024 Bora Lisanne Ponce is a 53 y.o. female coming in with complaint of L foot and R knee pain. Patient states that her knee has started to bother her in the past 2 weeks. Pain is minimal but starting to come back intermittently. Visco gave her minimal relief as to compared to the steroid.   L foot pain has increased since last visit. Pain over lateral metatarsals.        Past Medical History:  Diagnosis Date   Allergy    seasonal allergies   Anemia    hx of   Anxiety    on meds   Asthma    hx of   Bipolar disorder (HCC)    Cataract    bilateral sx   Chicken pox    Complication of anesthesia    oxygen saturation dropped after hysterectomy 2009 at [facility]   Depression    on meds   Diabetes mellitus    on meds   Dyspnea    GERD (gastroesophageal reflux disease)    hx of   Hyperlipidemia    on meds   Hypertension    readings   Hypothyroidism    hx of-LEFT thyroid  removed   Irregular heartbeat    saw dr Micky sees -cardiology as needed   Migraine    Sleep apnea    uses cpap   Past Surgical History:  Procedure Laterality Date   ANKLE SURGERY  Left 1989   BACK SURGERY  1999   BONE BIOPSY  12/13/2023   Procedure: BIOPSY, GI;  Surgeon: Suzann Inocente HERO, MD;  Location: [facility] ENDOSCOPY;  Service: Gastroenterology;;   CATARACT EXTRACTION, BILATERAL     CESAREAN SECTION     1995   ESOPHAGOGASTRODUODENOSCOPY N/A 12/13/2023   Procedure: EGD (ESOPHAGOGASTRODUODENOSCOPY);  Surgeon: Suzann Inocente HERO, MD;  Location: [facility] ENDOSCOPY;  Service: Gastroenterology;  Laterality: N/A;   LAPAROSCOPIC ASSISTED VAGINAL HYSTERECTOMY  2009     BSO fibroids, DUB, pelvic pain   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 REPAIR N/A 04/14/2016   Procedure: LAPAROSCOPIC ROUX-EN-Y GASTRIC BYPASS  WITH UPPER ENDOSCOPY;  Surgeon: Morene Olives, MD;  Location: [facility] ORS;  Service: General;  Laterality: N/A;   SHOULDER ARTHROSCOPY WITH OPEN ROTATOR CUFF REPAIR Right 01/02/2017   Procedure: Right shoulder arthroscopy, A-subcromial decompression, mini open rotator cuff repair, open distal clavicle resection, biceps tenodesis;  Surgeon: Kay Kemps, MD;  Location: [facility] OR;  Service: Orthopedics;  Laterality: Right;   THYROIDECTOMY, PARTIAL Left 2001   UMBILICAL HERNIA REPAIR  2003   WISDOM TOOTH EXTRACTION  Social History   Socioeconomic History   Marital status: Married    Spouse name: Not on file   Number of children: 1   Years of education: Not on file   Highest education level: Associate degree: academic program  Occupational History   Occupation: nursing  Tobacco Use   Smoking status: Never   Smokeless tobacco: Never  Vaping Use   Vaping status: Never Used  Substance and Sexual Activity   Alcohol use: No    Alcohol/week: 0.0 standard drinks of alcohol   Drug use: No   Sexual activity: Yes    Partners: Male  Other Topics Concern   Not on file  Social History Narrative   Not on file   Social Drivers of Health   Tobacco Use: Low Risk (03/10/2024)   Patient History    Smoking Tobacco Use: Never    Smokeless Tobacco Use: Never     Passive Exposure: Not on file  Financial Resource Strain: Low Risk (12/24/2022)   Overall Financial Resource Strain (CARDIA)    Difficulty of Paying Living Expenses: Not hard at all  Food Insecurity: No Food Insecurity (01/26/2024)   Epic    Worried About Radiation Protection Practitioner of Food in the Last Year: Never true    Ran Out of Food in the Last Year: Never true  Transportation Needs: No Transportation Needs (01/26/2024)   Epic    Lack of Transportation (Medical): No    Lack of Transportation (Non-Medical): No  Physical Activity: Unknown (12/24/2022)   Exercise Vital Sign    Days of Exercise per Week: 0 days    Minutes of Exercise per Session: Not on file  Stress: No Stress Concern Present (12/24/2022)   [employer] of Occupational Health - Occupational Stress Questionnaire    Feeling of Stress : Not at all  Social Connections: Moderately Isolated (12/24/2022)   Social Connection and Isolation Panel    Frequency of Communication with Friends and Family: Twice a week    Frequency of Social Gatherings with Friends and Family: Once a week    Attends Religious Services: Never    Database Administrator or Organizations: No    Attends Engineer, Structural: Not on file    Marital Status: Married  Depression (PHQ2-9): Low Risk (01/26/2024)   Depression (PHQ2-9)    PHQ-2 Score: 0  Alcohol Screen: Not on file  Housing: Low Risk (01/26/2024)   Epic    Unable to Pay for Housing in the Last Year: No    Number of Times Moved in the Last Year: 0    Homeless in the Last Year: No  Utilities: Not At Risk (01/26/2024)   Epic    Threatened with loss of utilities: No  Health Literacy: Not on file   Allergies[1] Family History  Problem Relation Age of Onset   Hyperlipidemia Mother    Diabetes Mother    Anxiety disorder Mother    Heart disease Mother    Hypertension Father    Lupus Father    Heart disease Father    Asthma Father    Colon polyps Father 76   Stroke Maternal Aunt    Seizures  Paternal Grandfather    Stroke Paternal Grandfather    Mental illness Paternal Grandfather    Breast cancer Neg Hx    Colon cancer Neg Hx    Esophageal cancer Neg Hx    Stomach cancer Neg Hx    Rectal cancer Neg Hx     Current Outpatient  Medications (Endocrine & Metabolic):    Dulaglutide  (TRULICITY ) 3 MG/0.5ML SOAJ, Inject 3 mg into the skin once a week.   metFORMIN  (GLUCOPHAGE ) 1000 MG tablet, Take 1 tablet (1,000 mg total) by mouth daily with supper.  Current Outpatient Medications (Cardiovascular):    atorvastatin  (LIPITOR) 40 MG tablet, Take 1 tablet (40 mg total) by mouth daily.   carvedilol  (COREG ) 6.25 MG tablet, Take 1 tablet (6.25 mg total) by mouth 2 (two) times daily with food.   lisinopril  (ZESTRIL ) 10 MG tablet, Take 1 tablet (10 mg total) by mouth daily.  Current Outpatient Medications (Respiratory):    Loratadine (CLARITIN PO), Take 1 tablet by mouth at bedtime.  Current Outpatient Medications (Analgesics):    acetaminophen  (TYLENOL ) 500 MG tablet, Take 500-1,000 mg by mouth every 6 (six) hours as needed for moderate pain (pain score 4-6).  Current Outpatient Medications (Hematological):    ferrous sulfate 325 (65 FE) MG tablet, Take 325 mg by mouth daily with breakfast.  Current Outpatient Medications (Other):    amoxicillin -clavulanate (AUGMENTIN ) 875-125 MG tablet, Take 1 tablet by mouth 2 (two) times daily for 10 days.   Ascorbic Acid (VITAMIN C) 500 MG CHEW, 1 tablet Orally Once a day   Biotin 89999 MCG TABS, Take 1 tablet by mouth daily.   brexpiprazole  (REXULTI ) 1 MG TABS tablet, Take 1 tablet (1 mg total) by mouth daily.   Calcium  Carbonate-Vitamin D  (CALTRATE 600+D PO), Take 500 mg by mouth in the morning, at noon, and at bedtime.   Continuous Glucose Sensor (DEXCOM G7 SENSOR) MISC, Apply 1 sensor every 10 days as directed.   cyclobenzaprine  (FLEXERIL ) 10 MG tablet, Take 1 tablet (10 mg total) by mouth daily.   famotidine  (PEPCID ) 20 MG tablet, Take 1  tablet (20 mg total) by mouth 2 (two) times daily.   famotidine  (PEPCID ) 20 MG tablet, take 1 tablet Orally twice a day   gabapentin  (NEURONTIN ) 800 MG tablet, Take 1 tablet (800 mg total) by mouth 3 (three) times daily.   Glucosamine 500 MG CAPS, 3 capsules with meals Orally Once a day   Glucosamine HCl (GLUCOSAMINE PO), Take 3 tablets by mouth daily.   glucose blood (FREESTYLE LITE) test strip, Use to check blood sugar 2 times daily   lamoTRIgine  (LAMICTAL ) 200 MG tablet, Take 2 tablets (400 mg total) by mouth at bedtime.   [START ON 06/06/2024] lamoTRIgine  (LAMICTAL ) 200 MG tablet, Take 1.5 tablets (300 mg total) by mouth at bedtime.   magnesium  oxide (MAG-OX) 400 MG tablet, Take 1 tablet (400 mg total) by mouth daily with food.   Multiple Vitamin (MULTIVITAMIN) tablet, Take 1 tablet by mouth daily.   TEGRETOL -XR 200 MG 12 hr tablet, Take 1 tablet (200 mg total) by mouth in the morning AND 2 tablets (400 mg total) at bedtime.   TEGRETOL -XR 200 MG 12 hr tablet, Take 1 tablet (200 mg total) by mouth every morning AND 2 tablets (400 mg total) at bedtime.   TEGRETOL -XR 200 MG 12 hr tablet, Take 1 tablet (200 mg total) by mouth every morning AND 2 tablets (400 mg total) at bedtime.   [START ON 06/06/2024] TEGRETOL -XR 200 MG 12 hr tablet, Take 1 tablet (200 mg total) by mouth in the morning for 90 days, THEN 2 tablets (400 mg total) at bedtime.   tiaGABine  (GABITRIL ) 4 MG tablet, Take 1 tablet (4 mg total) by mouth in the morning and at bedtime.   tiaGABine  (GABITRIL ) 4 MG tablet, Take 1 tablet (4 mg  total) by mouth in the morning and at bedtime.   [START ON 06/06/2024] tiaGABine  (GABITRIL ) 4 MG tablet, Take 1 tablet (4 mg total) by mouth in the morning and at bedtime.   Turmeric (QC TUMERIC COMPLEX PO), Take 1 Dose by mouth daily.   urea  (URE-NA) 15 g PACK oral packet, Take 15 g by mouth 2 (two) times daily.   Reviewed prior external information including notes and imaging from  primary care  provider As well as notes that were available from care everywhere and other healthcare systems.  Past medical history, social, surgical and family history all reviewed in electronic medical record.  No pertanent information unless stated regarding to the chief complaint.   Review of Systems:  No headache, visual changes, nausea, vomiting, diarrhea, constipation, dizziness, abdominal pain, skin rash, fevers, chills, night sweats, weight loss, swollen lymph nodes, body aches, joint swelling, chest pain, shortness of breath, mood changes. POSITIVE muscle aches  Objective  Blood pressure (!) 124/92, pulse 71, height 5' 9 (1.753 m), weight 285 lb (129.3 kg), last menstrual period 01/25/2008, SpO2 97%.   General: No apparent distress alert and oriented x3 mood and affect normal, dressed appropriately.  HEENT: Pupils equal, extraocular movements intact  Respiratory: Patient's speak in full sentences and does not appear short of breath  Cardiovascular: No lower extremity edema, non tender, no erythema  Knee exam arthritic changes noted of the right knee.  Trace effusion noted and with lateral tracking of the patella.  Positive patellar grind test noted.  Foot exam shows significant arthritic changes of the midfoot noted.  Swelling over the dorsal aspect of the midfoot.  Seems to be more lateral than medial.  After verbal consent patient was prepped with alcohol swab and with a 21-gauge 2 inch needle injected into the right knee joint.  Total of 2 cc 0.5% Marcaine  and 1 cc of Kenalog 40 mg/mL used.  No blood loss.  Band-Aid placed.  Postinjection instructions given.   Procedure: Injection in the patient's left midfoot cuboid cuneiform joint Ultrasound guided injection is preferred based studies that show increased duration, increased effect, greater accuracy, decreased procedural pain, increased response rate, and decreased cost with ultrasound guided versus blind injection.  Verbal informed consent  obtained.  Time-out conducted.  Noted no overlying erythema, induration, or other signs of local infection.  Skin prepped in a sterile fashion.  Local anesthesia: Topical Ethyl chloride.  With sterile technique and under real time ultrasound guidance: needle advanced into subacromial bursa, bursa seen distending under real time ultrasound guidance. 0.5 cc Kenalog-40, 0.5 cc lidocaine  injected into midfoot joint the cuboid cuneiform. Completed without difficulty  Pain immediately resolved suggesting accurate placement of the medication.  Advised to call if fevers/chills, erythema, induration, drainage, or persistent bleeding.  Images saved.    Impression and Recommendations:    The above documentation has been reviewed and is accurate and complete Arthea CHRISTELLA Sharps, DO        [1]  Allergies Allergen Reactions   Zofran  [Ondansetron ] Other (See Comments)    Severe headaches    "

## 2024-05-23 ENCOUNTER — Ambulatory Visit: Admitting: Family Medicine

## 2024-05-23 ENCOUNTER — Other Ambulatory Visit: Payer: Self-pay

## 2024-05-23 VITALS — BP 124/92 | HR 71 | Ht 69.0 in | Wt 285.0 lb

## 2024-05-23 DIAGNOSIS — M1711 Unilateral primary osteoarthritis, right knee: Secondary | ICD-10-CM | POA: Diagnosis not present

## 2024-05-23 DIAGNOSIS — M19072 Primary osteoarthritis, left ankle and foot: Secondary | ICD-10-CM

## 2024-05-23 DIAGNOSIS — M79672 Pain in left foot: Secondary | ICD-10-CM

## 2024-05-23 NOTE — Patient Instructions (Addendum)
 Injected R foot and L knee today Give update in 2 weeks See me in 10-12 weeks

## 2024-05-23 NOTE — Assessment & Plan Note (Signed)
 Beyond an incredible amount of swelling noted at the cuneiform joint.  Discussed with patient icing regimen and home exercises, discussed which activities to do and which ones to avoid.  We discussed proper shoes as well.  If continuing to have this type of swelling possible surgical intervention if needed.  Hopeful that this will make a difference.

## 2024-05-23 NOTE — Assessment & Plan Note (Signed)
 Did not respond well to the viscosupplementation.  Hopefully this will make a difference.  Discussed icing regimen and home exercises, increase activity slowly.  Follow-up again in 6 to 12 weeks

## 2024-05-24 ENCOUNTER — Encounter: Payer: Self-pay | Admitting: Family Medicine

## 2024-05-27 ENCOUNTER — Other Ambulatory Visit (HOSPITAL_BASED_OUTPATIENT_CLINIC_OR_DEPARTMENT_OTHER): Payer: Self-pay

## 2024-05-30 ENCOUNTER — Other Ambulatory Visit (HOSPITAL_BASED_OUTPATIENT_CLINIC_OR_DEPARTMENT_OTHER): Payer: Self-pay

## 2024-06-02 ENCOUNTER — Other Ambulatory Visit (HOSPITAL_BASED_OUTPATIENT_CLINIC_OR_DEPARTMENT_OTHER): Payer: Self-pay

## 2024-06-02 MED ORDER — LAMOTRIGINE 200 MG PO TABS
200.0000 mg | ORAL_TABLET | Freq: Two times a day (BID) | ORAL | 0 refills | Status: AC
  Filled 2024-06-02 – 2024-06-07 (×4): qty 180, 90d supply, fill #0

## 2024-06-02 MED ORDER — REXULTI 1 MG PO TABS
1.0000 mg | ORAL_TABLET | Freq: Every day | ORAL | 2 refills | Status: AC
  Filled 2024-06-02 – 2024-06-27 (×2): qty 30, 30d supply, fill #0

## 2024-06-03 ENCOUNTER — Other Ambulatory Visit (HOSPITAL_BASED_OUTPATIENT_CLINIC_OR_DEPARTMENT_OTHER): Payer: Self-pay

## 2024-06-03 MED ORDER — MAGNESIUM OXIDE 400 MG PO TABS
400.0000 mg | ORAL_TABLET | Freq: Every day | ORAL | 0 refills | Status: AC
  Filled 2024-06-03: qty 90, 90d supply, fill #0
  Filled 2024-06-03: qty 120, 120d supply, fill #0

## 2024-06-06 ENCOUNTER — Other Ambulatory Visit (HOSPITAL_BASED_OUTPATIENT_CLINIC_OR_DEPARTMENT_OTHER): Payer: Self-pay

## 2024-06-06 ENCOUNTER — Other Ambulatory Visit: Payer: Self-pay

## 2024-06-07 ENCOUNTER — Other Ambulatory Visit (HOSPITAL_BASED_OUTPATIENT_CLINIC_OR_DEPARTMENT_OTHER): Payer: Self-pay

## 2024-06-16 ENCOUNTER — Ambulatory Visit: Admitting: Internal Medicine

## 2024-06-27 ENCOUNTER — Other Ambulatory Visit (HOSPITAL_BASED_OUTPATIENT_CLINIC_OR_DEPARTMENT_OTHER): Payer: Self-pay

## 2024-06-28 NOTE — Progress Notes (Unsigned)
 " [name] JENI Cloretta [name] [address] [place] [address] Phone: [phone] Subjective:   [name], am serving as a scribe for Dr. Arthea [name].  I'm seeing this patient by the request  of:  Seabron Lenis, MD  CC: foot pain   YEP:Dlagzrupcz  05/23/2024 Did not respond well to the viscosupplementation.  Hopefully this will make a difference.  Discussed icing regimen and home exercises, increase activity slowly.  Follow-up again in 6 to 12 weeks     Beyond an incredible amount of swelling noted at the cuneiform joint.  Discussed with patient icing regimen and home exercises, discussed which activities to do and which ones to avoid.  We discussed proper shoes as well.  If continuing to have this type of swelling possible surgical intervention if needed.  Hopeful that this will make a difference.     Updated 06/29/2024 [name] is a 54 y.o. female coming in with complaint of foot and knee pain. Just foot today. Popped cyst last appointment. Pain went away, but has been in pain for about 4-5 weeks.       Past Medical History:  Diagnosis Date   Allergy    seasonal allergies   Anemia    hx of   Anxiety    on meds   Asthma    hx of   Bipolar disorder (HCC)    Cataract    bilateral sx   Chicken pox    Complication of anesthesia    oxygen saturation dropped after hysterectomy 2009 at [facility]   Depression    on meds   Diabetes mellitus    on meds   Dyspnea    GERD (gastroesophageal reflux disease)    hx of   Hyperlipidemia    on meds   Hypertension    readings   Hypothyroidism    hx of-LEFT thyroid  removed   Irregular heartbeat    saw dr Micky sees -cardiology as needed   Migraine    Sleep apnea    uses cpap   Past Surgical History:  Procedure Laterality Date   ANKLE SURGERY Left 1989   BACK SURGERY  1999   BONE BIOPSY  12/13/2023   Procedure: BIOPSY, GI;  Surgeon: Suzann Inocente HERO, MD;  Location: [facility] ENDOSCOPY;  Service:  Gastroenterology;;   CATARACT EXTRACTION, BILATERAL     CESAREAN SECTION     1995   ESOPHAGOGASTRODUODENOSCOPY N/A 12/13/2023   Procedure: EGD (ESOPHAGOGASTRODUODENOSCOPY);  Surgeon: Suzann Inocente HERO, MD;  Location: [facility] ENDOSCOPY;  Service: Gastroenterology;  Laterality: N/A;   LAPAROSCOPIC ASSISTED VAGINAL HYSTERECTOMY  2009     BSO fibroids, DUB, pelvic pain   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 REPAIR N/A 04/14/2016   Procedure: LAPAROSCOPIC ROUX-EN-Y GASTRIC BYPASS  WITH UPPER ENDOSCOPY;  Surgeon: Morene Olives, MD;  Location: [facility] ORS;  Service: General;  Laterality: N/A;   SHOULDER ARTHROSCOPY WITH OPEN ROTATOR CUFF REPAIR Right 01/02/2017   Procedure: Right shoulder arthroscopy, A-subcromial decompression, mini open rotator cuff repair, open distal clavicle resection, biceps tenodesis;  Surgeon: Kay Kemps, MD;  Location: [facility] OR;  Service: Orthopedics;  Laterality: Right;   THYROIDECTOMY, PARTIAL Left 2001   UMBILICAL HERNIA REPAIR  2003   WISDOM TOOTH EXTRACTION     Social History   Socioeconomic History   Marital status: Married    Spouse name: Not on file   Number of children: 1   Years of education: Not on file   Highest  education level: Associate degree: academic program  Occupational History   Occupation: nursing  Tobacco Use   Smoking status: Never   Smokeless tobacco: Never  Vaping Use   Vaping status: Never Used  Substance and Sexual Activity   Alcohol use: No    Alcohol/week: 0.0 standard drinks of alcohol   Drug use: No   Sexual activity: Yes    Partners: Male  Other Topics Concern   Not on file  Social History Narrative   Not on file   Social Drivers of Health   Tobacco Use: Low Risk (05/24/2024)   Patient History    Smoking Tobacco Use: Never    Smokeless Tobacco Use: Never    Passive Exposure: Not on file  Financial Resource Strain: Low Risk (12/24/2022)   Overall Financial Resource Strain (CARDIA)    Difficulty of Paying  Living Expenses: Not hard at all  Food Insecurity: No Food Insecurity (01/26/2024)   Epic    Worried About Radiation Protection Practitioner of Food in the Last Year: Never true    Ran Out of Food in the Last Year: Never true  Transportation Needs: No Transportation Needs (01/26/2024)   Epic    Lack of Transportation (Medical): No    Lack of Transportation (Non-Medical): No  Physical Activity: Unknown (12/24/2022)   Exercise Vital Sign    Days of Exercise per Week: 0 days    Minutes of Exercise per Session: Not on file  Stress: No Stress Concern Present (12/24/2022)   [employer] of Occupational Health - Occupational Stress Questionnaire    Feeling of Stress : Not at all  Social Connections: Moderately Isolated (12/24/2022)   Social Connection and Isolation Panel    Frequency of Communication with Friends and Family: Twice a week    Frequency of Social Gatherings with Friends and Family: Once a week    Attends Religious Services: Never    Database Administrator or Organizations: No    Attends Engineer, Structural: Not on file    Marital Status: Married  Depression (PHQ2-9): Low Risk (01/26/2024)   Depression (PHQ2-9)    PHQ-2 Score: 0  Alcohol Screen: Not on file  Housing: Low Risk (01/26/2024)   Epic    Unable to Pay for Housing in the Last Year: No    Number of Times Moved in the Last Year: 0    Homeless in the Last Year: No  Utilities: Not At Risk (01/26/2024)   Epic    Threatened with loss of utilities: No  Health Literacy: Not on file   Allergies[1] Family History  Problem Relation Age of Onset   Hyperlipidemia Mother    Diabetes Mother    Anxiety disorder Mother    Heart disease Mother    Hypertension Father    Lupus Father    Heart disease Father    Asthma Father    Colon polyps Father 27   Stroke Maternal Aunt    Seizures Paternal Grandfather    Stroke Paternal Grandfather    Mental illness Paternal Grandfather    Breast cancer Neg Hx    Colon cancer Neg Hx    Esophageal  cancer Neg Hx    Stomach cancer Neg Hx    Rectal cancer Neg Hx     Current Outpatient Medications (Endocrine & Metabolic):    Dulaglutide  (TRULICITY ) 3 MG/0.5ML SOAJ, Inject 3 mg into the skin once a week.   metFORMIN  (GLUCOPHAGE ) 1000 MG tablet, Take 1 tablet (1,000 mg total) by mouth  daily with supper.  Current Outpatient Medications (Cardiovascular):    atorvastatin  (LIPITOR) 40 MG tablet, Take 1 tablet (40 mg total) by mouth daily.   carvedilol  (COREG ) 6.25 MG tablet, Take 1 tablet (6.25 mg total) by mouth 2 (two) times daily with food.   lisinopril  (ZESTRIL ) 10 MG tablet, Take 1 tablet (10 mg total) by mouth daily.  Current Outpatient Medications (Respiratory):    Loratadine (CLARITIN PO), Take 1 tablet by mouth at bedtime.  Current Outpatient Medications (Analgesics):    acetaminophen  (TYLENOL ) 500 MG tablet, Take 500-1,000 mg by mouth every 6 (six) hours as needed for moderate pain (pain score 4-6).  Current Outpatient Medications (Hematological):    ferrous sulfate 325 (65 FE) MG tablet, Take 325 mg by mouth daily with breakfast.  Current Outpatient Medications (Other):    Ascorbic Acid (VITAMIN C) 500 MG CHEW, 1 tablet Orally Once a day   Biotin 89999 MCG TABS, Take 1 tablet by mouth daily.   brexpiprazole  (REXULTI ) 1 MG TABS tablet, Take 1 tablet (1 mg total) by mouth daily.   brexpiprazole  (REXULTI ) 1 MG TABS tablet, Take 1 tablet (1 mg total) by mouth daily.   Calcium  Carbonate-Vitamin D  (CALTRATE 600+D PO), Take 500 mg by mouth in the morning, at noon, and at bedtime.   Continuous Glucose Sensor (DEXCOM G7 SENSOR) MISC, Apply 1 sensor every 10 days as directed.   cyclobenzaprine  (FLEXERIL ) 10 MG tablet, Take 1 tablet (10 mg total) by mouth daily.   famotidine  (PEPCID ) 20 MG tablet, Take 1 tablet (20 mg total) by mouth 2 (two) times daily.   famotidine  (PEPCID ) 20 MG tablet, take 1 tablet Orally twice a day   gabapentin  (NEURONTIN ) 800 MG tablet, Take 1 tablet (800 mg  total) by mouth 3 (three) times daily.   Glucosamine 500 MG CAPS, 3 capsules with meals Orally Once a day   Glucosamine HCl (GLUCOSAMINE PO), Take 3 tablets by mouth daily.   glucose blood (FREESTYLE LITE) test strip, Use to check blood sugar 2 times daily   lamoTRIgine  (LAMICTAL ) 200 MG tablet, Take 2 tablets (400 mg total) by mouth at bedtime.   lamoTRIgine  (LAMICTAL ) 200 MG tablet, Take 1.5 tablets (300 mg total) by mouth at bedtime.   lamoTRIgine  (LAMICTAL ) 200 MG tablet, Take 1 tablet (200 mg total) by mouth 2 (two) times daily.   magnesium  oxide (MAG-OX) 400 MG tablet, Take 1 tablet (400 mg total) by mouth daily with food.   magnesium  oxide (MAG-OX) 400 MG tablet, Take 1 tablet (400 mg total) by mouth daily with food.   Multiple Vitamin (MULTIVITAMIN) tablet, Take 1 tablet by mouth daily.   TEGRETOL -XR 200 MG 12 hr tablet, Take 1 tablet (200 mg total) by mouth in the morning AND 2 tablets (400 mg total) at bedtime.   TEGRETOL -XR 200 MG 12 hr tablet, Take 1 tablet (200 mg total) by mouth every morning AND 2 tablets (400 mg total) at bedtime.   TEGRETOL -XR 200 MG 12 hr tablet, Take 1 tablet (200 mg total) by mouth every morning AND 2 tablets (400 mg total) at bedtime.   TEGRETOL -XR 200 MG 12 hr tablet, Take 1 tablet (200 mg total) by mouth in the morning for 90 days, THEN 2 tablets (400 mg total) at bedtime.   tiaGABine  (GABITRIL ) 4 MG tablet, Take 1 tablet (4 mg total) by mouth in the morning and at bedtime.   tiaGABine  (GABITRIL ) 4 MG tablet, Take 1 tablet (4 mg total) by mouth in the morning  and at bedtime.   tiaGABine  (GABITRIL ) 4 MG tablet, Take 1 tablet (4 mg total) by mouth in the morning and at bedtime.   Turmeric (QC TUMERIC COMPLEX PO), Take 1 Dose by mouth daily.   urea  (URE-NA) 15 g PACK oral packet, Take 15 g by mouth 2 (two) times daily.   Reviewed prior external information including notes and imaging from  primary care provider As well as notes that were available from care  everywhere and other healthcare systems.  Past medical history, social, surgical and family history all reviewed in electronic medical record.  No pertanent information unless stated regarding to the chief complaint.   Review of Systems:  No headache, visual changes, nausea, vomiting, diarrhea, constipation, dizziness, abdominal pain, skin rash, fevers, chills, night sweats, weight loss, swollen lymph nodes, body aches, joint swelling, chest pain, shortness of breath, mood changes. POSITIVE muscle aches  Objective  Blood pressure 132/78, pulse 72, height 5' 9 (1.753 m), weight 291 lb (132 kg), last menstrual period 01/25/2008, SpO2 98%.   General: No apparent distress alert and oriented x3 mood and affect normal, dressed appropriately.  HEENT: Pupils equal, extraocular movements intact  Respiratory: Patient's speak in full sentences and does not appear short of breath  Cardiovascular: No lower extremity edema, non tender, no erythema  Foot exam shows swelling noted over the midfoot.  Tender to palpation throughout the entire midfoot from the cuneiform and to the navicular and medial and form joint.  Limited muscular skeletal ultrasound was performed and interpreted by [name] HUSSAR, M  Significant hypoechoic changes throughout the midfoot.  Patient still has an area cystic formation noted significant communication to the joints themselves.  Increasing in Doppler flow noted. Impression: Midfoot arthritis    Impression and Recommendations:    The above documentation has been reviewed and is accurate and complete Hussar CHRISTELLA Claudene, DO        [1]  Allergies Allergen Reactions   Zofran  [Ondansetron ] Other (See Comments)    Severe headaches    "

## 2024-06-29 ENCOUNTER — Other Ambulatory Visit: Payer: Self-pay

## 2024-06-29 ENCOUNTER — Ambulatory Visit: Admitting: Family Medicine

## 2024-06-29 ENCOUNTER — Encounter: Payer: Self-pay | Admitting: Family Medicine

## 2024-06-29 VITALS — BP 132/78 | HR 72 | Ht 69.0 in | Wt 291.0 lb

## 2024-06-29 DIAGNOSIS — M79672 Pain in left foot: Secondary | ICD-10-CM

## 2024-06-29 DIAGNOSIS — M19072 Primary osteoarthritis, left ankle and foot: Secondary | ICD-10-CM

## 2024-06-29 NOTE — Patient Instructions (Addendum)
 Referral Dr. Elsa

## 2024-06-29 NOTE — Progress Notes (Unsigned)
 "  NEUROLOGY FOLLOW UP OFFICE NOTE  [name] [id number]  Assessment/Plan:   Diabetic polyneuropathy    Gabapentin  800mg  three times daily.  Follow up in 6 months.        Subjective:  [name] is a 54 year old female with hypertension, DM II, deficiency anemia, depression and Bipolar disorder who follows up for bilateral leg pain and neuropathy.  UPDATE: Current medication:  gabapentin  800mg  TID.  ***  Labs: 12/08/2023:  Hgb A2c 7.2 12/11/2023:  B12 1498, folate 24.2, TSH 1.390, free T4 0.90 12/12/2023:  HIV Abs negative 01/26/2024:  B1 119.4, ferritin 21, Zinc  61, Copper  127, sed rate 42,     HISTORY: She began experiencing bilateral leg discomfort around 2022.  When she lays down in bed, she develops burning from her knees down to her feet.  She also develops muscle cramps or charley horses from her inner thighs down her legs.  Within an hour, she needs to get out of bed.  Moving her legs in the bed isn't too helpful but when she gets up to walk around, the symptoms resolve within 5 minutes.  Denies low back pain or radicular pain down the legs.  Sometimes laying on her left side may help delay discomfort of the leg cramps.  She has resorted to sleeping in the recliner.  She does have type 2 diabetes.  Hgb A1c from October was 81.  Over the past year, labs revealed TSH 1.35, B12 343, folate >24.2 and Mg 1.7.  CBC has not revealed anemia and BMP and hepatic panel have not revealed kidney or liver dysfunction. Ferritin level was 6.4.  She was advised to start ferrous sulfate 325mg  daily.  She started taking cyclobenzaprine  at night to help with the leg cramps.  She already takes gabapentin  800mg  three times daily for mood stabilizer.  She was also started on trial of ropinirole .  She noted some improvement but continued experiencing burning in the legs, so it was discontinued.  NCV-EMG of right upper and left lower limbs on 09/29/2022 revealed mild active sensorimotor  polyneuropathy.  TSH in April 2024 was 1.41.  ABI normal.      PAST MEDICAL HISTORY: Past Medical History:  Diagnosis Date   Allergy    seasonal allergies   Anemia    hx of   Anxiety    on meds   Asthma    hx of   Bipolar disorder (HCC)    Cataract    bilateral sx   Chicken pox    Complication of anesthesia    oxygen saturation dropped after hysterectomy 2009 at [facility]   Depression    on meds   Diabetes mellitus    on meds   Dyspnea    GERD (gastroesophageal reflux disease)    hx of   Hyperlipidemia    on meds   Hypertension    readings   Hypothyroidism    hx of-LEFT thyroid  removed   Irregular heartbeat    saw dr Micky sees -cardiology as needed   Migraine    Sleep apnea    uses cpap    MEDICATIONS: Current Outpatient Medications on File Prior to Visit  Medication Sig Dispense Refill   acetaminophen  (TYLENOL ) 500 MG tablet Take 500-1,000 mg by mouth every 6 (six) hours as needed for moderate pain (pain score 4-6).     Ascorbic Acid (VITAMIN C) 500 MG CHEW 1 tablet Orally Once a day     atorvastatin  (LIPITOR) 40  MG tablet Take 1 tablet (40 mg total) by mouth daily. 90 tablet 1   Biotin 10000 MCG TABS Take 1 tablet by mouth daily.     brexpiprazole  (REXULTI ) 1 MG TABS tablet Take 1 tablet (1 mg total) by mouth daily. 30 tablet 1   brexpiprazole  (REXULTI ) 1 MG TABS tablet Take 1 tablet (1 mg total) by mouth daily. 30 tablet 2   Calcium  Carbonate-Vitamin D  (CALTRATE 600+D PO) Take 500 mg by mouth in the morning, at noon, and at bedtime.     carvedilol  (COREG ) 6.25 MG tablet Take 1 tablet (6.25 mg total) by mouth 2 (two) times daily with food. 180 tablet 3   Continuous Glucose Sensor (DEXCOM G7 SENSOR) MISC Apply 1 sensor every 10 days as directed. 9 each 4   cyclobenzaprine  (FLEXERIL ) 10 MG tablet Take 1 tablet (10 mg total) by mouth daily. 90 tablet 1   Dulaglutide  (TRULICITY ) 3 MG/0.5ML SOAJ Inject 3 mg into the skin once a week. 6 mL 3   famotidine  (PEPCID ) 20 MG  tablet Take 1 tablet (20 mg total) by mouth 2 (two) times daily. 60 tablet 11   famotidine  (PEPCID ) 20 MG tablet take 1 tablet Orally twice a day 180 tablet 3   ferrous sulfate 325 (65 FE) MG tablet Take 325 mg by mouth daily with breakfast.     gabapentin  (NEURONTIN ) 800 MG tablet Take 1 tablet (800 mg total) by mouth 3 (three) times daily. 90 tablet 5   Glucosamine 500 MG CAPS 3 capsules with meals Orally Once a day     Glucosamine HCl (GLUCOSAMINE PO) Take 3 tablets by mouth daily.     glucose blood (FREESTYLE LITE) test strip Use to check blood sugar 2 times daily 100 each 3   lamoTRIgine  (LAMICTAL ) 200 MG tablet Take 2 tablets (400 mg total) by mouth at bedtime. 180 tablet 0   lamoTRIgine  (LAMICTAL ) 200 MG tablet Take 1.5 tablets (300 mg total) by mouth at bedtime. 135 tablet 0   lamoTRIgine  (LAMICTAL ) 200 MG tablet Take 1 tablet (200 mg total) by mouth 2 (two) times daily. 180 tablet 0   lisinopril  (ZESTRIL ) 10 MG tablet Take 1 tablet (10 mg total) by mouth daily. 90 tablet 3   Loratadine (CLARITIN PO) Take 1 tablet by mouth at bedtime.     magnesium  oxide (MAG-OX) 400 MG tablet Take 1 tablet (400 mg total) by mouth daily with food. 30 tablet 3   magnesium  oxide (MAG-OX) 400 MG tablet Take 1 tablet (400 mg total) by mouth daily with food. 90 tablet 0   metFORMIN  (GLUCOPHAGE ) 1000 MG tablet Take 1 tablet (1,000 mg total) by mouth daily with supper. 90 tablet 3   Multiple Vitamin (MULTIVITAMIN) tablet Take 1 tablet by mouth daily.     TEGRETOL -XR 200 MG 12 hr tablet Take 1 tablet (200 mg total) by mouth in the morning AND 2 tablets (400 mg total) at bedtime. 270 tablet 0   TEGRETOL -XR 200 MG 12 hr tablet Take 1 tablet (200 mg total) by mouth every morning AND 2 tablets (400 mg total) at bedtime. 270 tablet 0   TEGRETOL -XR 200 MG 12 hr tablet Take 1 tablet (200 mg total) by mouth every morning AND 2 tablets (400 mg total) at bedtime. 270 tablet 0   TEGRETOL -XR 200 MG 12 hr tablet Take 1 tablet  (200 mg total) by mouth in the morning for 90 days, THEN 2 tablets (400 mg total) at bedtime. 270 tablet 0  tiaGABine  (GABITRIL ) 4 MG tablet Take 1 tablet (4 mg total) by mouth in the morning and at bedtime. 180 tablet 0   tiaGABine  (GABITRIL ) 4 MG tablet Take 1 tablet (4 mg total) by mouth in the morning and at bedtime. 180 tablet 0   tiaGABine  (GABITRIL ) 4 MG tablet Take 1 tablet (4 mg total) by mouth in the morning and at bedtime. 180 tablet 0   Turmeric (QC TUMERIC COMPLEX PO) Take 1 Dose by mouth daily.     urea  (URE-NA) 15 g PACK oral packet Take 15 g by mouth 2 (two) times daily. 64 packet 3   No current facility-administered medications on file prior to visit.    ALLERGIES: Allergies  Allergen Reactions   Zofran  [Ondansetron ] Other (See Comments)    Severe headaches     FAMILY HISTORY: Family History  Problem Relation Age of Onset   Hyperlipidemia Mother    Diabetes Mother    Anxiety disorder Mother    Heart disease Mother    Hypertension Father    Lupus Father    Heart disease Father    Asthma Father    Colon polyps Father 38   Stroke Maternal Aunt    Seizures Paternal Grandfather    Stroke Paternal Grandfather    Mental illness Paternal Grandfather    Breast cancer Neg Hx    Colon cancer Neg Hx    Esophageal cancer Neg Hx    Stomach cancer Neg Hx    Rectal cancer Neg Hx       Objective:  *** General: No acute distress.  Patient appears well-groomed.   ***     Juliene Dunnings, DO  CC: Alm Rav, MD       "

## 2024-06-29 NOTE — Assessment & Plan Note (Signed)
 Patient did not respond well to the naviculocuneiform injection and unfortunately it was short-lived.  Patient has COVID changes and need throughout the midfoot at this.  Unfortunately I do not feel at this point surgical intervention may be necessary.  Will like to refer patient to a foot and ankle specialist for further evaluation.  Patient is in agreement with this.

## 2024-06-30 ENCOUNTER — Ambulatory Visit (INDEPENDENT_AMBULATORY_CARE_PROVIDER_SITE_OTHER): Admitting: Neurology

## 2024-06-30 ENCOUNTER — Encounter: Payer: Self-pay | Admitting: Neurology

## 2024-06-30 ENCOUNTER — Other Ambulatory Visit (HOSPITAL_BASED_OUTPATIENT_CLINIC_OR_DEPARTMENT_OTHER): Payer: Self-pay

## 2024-06-30 VITALS — BP 163/91 | HR 72 | Wt 294.0 lb

## 2024-06-30 DIAGNOSIS — E1142 Type 2 diabetes mellitus with diabetic polyneuropathy: Secondary | ICD-10-CM | POA: Diagnosis not present

## 2024-06-30 MED ORDER — GABAPENTIN 800 MG PO TABS
800.0000 mg | ORAL_TABLET | Freq: Three times a day (TID) | ORAL | 3 refills | Status: AC
  Filled 2024-06-30 – 2024-07-01 (×2): qty 270, 90d supply, fill #0

## 2024-06-30 NOTE — Patient Instructions (Signed)
 Gabapentin  800mg  three times daily

## 2024-07-01 ENCOUNTER — Other Ambulatory Visit (HOSPITAL_BASED_OUTPATIENT_CLINIC_OR_DEPARTMENT_OTHER): Payer: Self-pay

## 2024-07-04 ENCOUNTER — Ambulatory Visit: Admitting: Family Medicine

## 2024-07-08 ENCOUNTER — Ambulatory Visit: Admitting: Internal Medicine

## 2024-07-14 ENCOUNTER — Ambulatory Visit: Admitting: Family Medicine

## 2024-08-04 ENCOUNTER — Ambulatory Visit: Admitting: Family Medicine

## 2025-07-06 ENCOUNTER — Ambulatory Visit: Payer: Self-pay | Admitting: Neurology
